# Patient Record
Sex: Male | Born: 1965 | Race: White | Hispanic: No | Marital: Single | Smoking: Current every day smoker
Health system: Southern US, Community
[De-identification: ages and names within clinical notes are randomized; demographics above are authoritative.]

## PROBLEM LIST (undated history)

## (undated) DIAGNOSIS — J449 Chronic obstructive pulmonary disease, unspecified: Secondary | ICD-10-CM

## (undated) DIAGNOSIS — I82409 Acute embolism and thrombosis of unspecified deep veins of unspecified lower extremity: Secondary | ICD-10-CM

## (undated) DIAGNOSIS — F32A Depression, unspecified: Secondary | ICD-10-CM

## (undated) DIAGNOSIS — I2510 Atherosclerotic heart disease of native coronary artery without angina pectoris: Secondary | ICD-10-CM

## (undated) DIAGNOSIS — I2699 Other pulmonary embolism without acute cor pulmonale: Secondary | ICD-10-CM

## (undated) DIAGNOSIS — F329 Major depressive disorder, single episode, unspecified: Secondary | ICD-10-CM

## (undated) DIAGNOSIS — M359 Systemic involvement of connective tissue, unspecified: Secondary | ICD-10-CM

## (undated) DIAGNOSIS — M1990 Unspecified osteoarthritis, unspecified site: Secondary | ICD-10-CM

## (undated) DIAGNOSIS — D6862 Lupus anticoagulant syndrome: Secondary | ICD-10-CM

## (undated) DIAGNOSIS — M479 Spondylosis, unspecified: Secondary | ICD-10-CM

## (undated) DIAGNOSIS — B1910 Unspecified viral hepatitis B without hepatic coma: Secondary | ICD-10-CM

## (undated) DIAGNOSIS — F1010 Alcohol abuse, uncomplicated: Secondary | ICD-10-CM

## (undated) HISTORY — PX: OTHER SURGICAL HISTORY: SHX169

## (undated) HISTORY — PX: TONSILLECTOMY: SUR1361

---

## 2010-06-06 ENCOUNTER — Emergency Department (HOSPITAL_COMMUNITY): Admission: EM | Admit: 2010-06-06 | Discharge: 2010-06-06 | Payer: Self-pay | Admitting: Emergency Medicine

## 2010-06-07 DIAGNOSIS — I2699 Other pulmonary embolism without acute cor pulmonale: Secondary | ICD-10-CM

## 2010-06-07 HISTORY — DX: Other pulmonary embolism without acute cor pulmonale: I26.99

## 2010-06-21 ENCOUNTER — Emergency Department (HOSPITAL_COMMUNITY): Admission: EM | Admit: 2010-06-21 | Discharge: 2010-06-21 | Payer: Self-pay | Admitting: Emergency Medicine

## 2010-06-26 ENCOUNTER — Inpatient Hospital Stay (HOSPITAL_COMMUNITY)
Admission: EM | Admit: 2010-06-26 | Discharge: 2010-07-02 | Payer: Self-pay | Source: Home / Self Care | Admitting: Emergency Medicine

## 2010-06-26 ENCOUNTER — Ambulatory Visit: Payer: Self-pay | Admitting: Pulmonary Disease

## 2010-06-30 ENCOUNTER — Encounter (INDEPENDENT_AMBULATORY_CARE_PROVIDER_SITE_OTHER): Payer: Self-pay | Admitting: Internal Medicine

## 2010-06-30 ENCOUNTER — Ambulatory Visit: Payer: Self-pay | Admitting: Surgery

## 2010-11-19 LAB — HEPATIC FUNCTION PANEL
ALT: 59 U/L — ABNORMAL HIGH (ref 0–53)
AST: 85 U/L — ABNORMAL HIGH (ref 0–37)
Albumin: 2.7 g/dL — ABNORMAL LOW (ref 3.5–5.2)
Alkaline Phosphatase: 86 U/L (ref 39–117)
Total Bilirubin: 0.2 mg/dL — ABNORMAL LOW (ref 0.3–1.2)

## 2010-11-19 LAB — AFB CULTURE WITH SMEAR (NOT AT ARMC)
Acid Fast Smear: NONE SEEN
Acid Fast Smear: NONE SEEN
Acid Fast Smear: NONE SEEN

## 2010-11-19 LAB — CBC
HCT: 38.1 % — ABNORMAL LOW (ref 39.0–52.0)
HCT: 40.3 % (ref 39.0–52.0)
HCT: 44.1 % (ref 39.0–52.0)
HCT: 45.8 % (ref 39.0–52.0)
Hemoglobin: 12.8 g/dL — ABNORMAL LOW (ref 13.0–17.0)
Hemoglobin: 12.9 g/dL — ABNORMAL LOW (ref 13.0–17.0)
Hemoglobin: 13.9 g/dL (ref 13.0–17.0)
Hemoglobin: 14.1 g/dL (ref 13.0–17.0)
Hemoglobin: 15 g/dL (ref 13.0–17.0)
MCH: 32.6 pg (ref 26.0–34.0)
MCH: 32.6 pg (ref 26.0–34.0)
MCH: 32.9 pg (ref 26.0–34.0)
MCH: 33 pg (ref 26.0–34.0)
MCH: 33 pg (ref 26.0–34.0)
MCH: 33.2 pg (ref 26.0–34.0)
MCHC: 33.8 g/dL (ref 30.0–36.0)
MCHC: 33.9 g/dL (ref 30.0–36.0)
MCHC: 34.2 g/dL (ref 30.0–36.0)
MCHC: 34.3 g/dL (ref 30.0–36.0)
MCV: 96.1 fL (ref 78.0–100.0)
MCV: 97 fL (ref 78.0–100.0)
MCV: 97.1 fL (ref 78.0–100.0)
MCV: 97.8 fL (ref 78.0–100.0)
Platelets: 207 10*3/uL (ref 150–400)
Platelets: 211 10*3/uL (ref 150–400)
Platelets: 225 10*3/uL (ref 150–400)
Platelets: 234 10*3/uL (ref 150–400)
Platelets: 260 10*3/uL (ref 150–400)
RBC: 3.93 MIL/uL — ABNORMAL LOW (ref 4.22–5.81)
RBC: 3.96 MIL/uL — ABNORMAL LOW (ref 4.22–5.81)
RBC: 4.19 MIL/uL — ABNORMAL LOW (ref 4.22–5.81)
RBC: 4.21 MIL/uL — ABNORMAL LOW (ref 4.22–5.81)
RBC: 4.3 MIL/uL (ref 4.22–5.81)
RBC: 4.39 MIL/uL (ref 4.22–5.81)
RDW: 16.2 % — ABNORMAL HIGH (ref 11.5–15.5)
RDW: 16.5 % — ABNORMAL HIGH (ref 11.5–15.5)
RDW: 17.1 % — ABNORMAL HIGH (ref 11.5–15.5)
WBC: 5 10*3/uL (ref 4.0–10.5)
WBC: 5.6 10*3/uL (ref 4.0–10.5)
WBC: 6.3 10*3/uL (ref 4.0–10.5)
WBC: 7.3 10*3/uL (ref 4.0–10.5)

## 2010-11-19 LAB — COMPREHENSIVE METABOLIC PANEL
ALT: 42 U/L (ref 0–53)
ALT: 61 U/L — ABNORMAL HIGH (ref 0–53)
AST: 80 U/L — ABNORMAL HIGH (ref 0–37)
Albumin: 2.4 g/dL — ABNORMAL LOW (ref 3.5–5.2)
Alkaline Phosphatase: 102 U/L (ref 39–117)
Alkaline Phosphatase: 80 U/L (ref 39–117)
Alkaline Phosphatase: 86 U/L (ref 39–117)
BUN: 4 mg/dL — ABNORMAL LOW (ref 6–23)
BUN: 5 mg/dL — ABNORMAL LOW (ref 6–23)
BUN: 6 mg/dL (ref 6–23)
CO2: 26 mEq/L (ref 19–32)
CO2: 27 mEq/L (ref 19–32)
CO2: 28 mEq/L (ref 19–32)
Calcium: 8.1 mg/dL — ABNORMAL LOW (ref 8.4–10.5)
Calcium: 8.3 mg/dL — ABNORMAL LOW (ref 8.4–10.5)
Chloride: 105 mEq/L (ref 96–112)
Creatinine, Ser: 0.57 mg/dL (ref 0.4–1.5)
Creatinine, Ser: 0.61 mg/dL (ref 0.4–1.5)
GFR calc non Af Amer: >60 mL/min (ref >60)
GFR calc non Af Amer: >60 mL/min (ref >60)
Glucose, Bld: 81 mg/dL (ref 70–99)
Glucose, Bld: 82 mg/dL (ref 70–99)
Glucose, Bld: 87 mg/dL (ref 70–99)
Glucose, Bld: 99 mg/dL (ref 70–99)
Potassium: 3.8 mEq/L (ref 3.5–5.1)
Potassium: 4 mEq/L (ref 3.5–5.1)
Potassium: 4.3 mEq/L (ref 3.5–5.1)
Sodium: 135 mEq/L (ref 135–145)
Sodium: 141 mEq/L (ref 135–145)
Sodium: 145 mEq/L (ref 135–145)
Total Bilirubin: 0.3 mg/dL (ref 0.3–1.2)
Total Protein: 6.1 g/dL (ref 6.0–8.3)
Total Protein: 6.6 g/dL (ref 6.0–8.3)
Total Protein: 7.4 g/dL (ref 6.0–8.3)

## 2010-11-19 LAB — BASIC METABOLIC PANEL
BUN: 3 mg/dL — ABNORMAL LOW (ref 6–23)
CO2: 25 mEq/L (ref 19–32)
CO2: 27 mEq/L (ref 19–32)
Calcium: 8.5 mg/dL (ref 8.4–10.5)
Calcium: 8.6 mg/dL (ref 8.4–10.5)
Chloride: 104 mEq/L (ref 96–112)
Chloride: 106 mEq/L (ref 96–112)
Creatinine, Ser: 0.68 mg/dL (ref 0.4–1.5)
GFR calc Af Amer: >60 mL/min (ref >60)
GFR calc Af Amer: >60 mL/min (ref >60)
Glucose, Bld: 99 mg/dL (ref 70–99)
Sodium: 141 mEq/L (ref 135–145)

## 2010-11-19 LAB — PROTIME-INR
INR: 1.3 (ref 0.00–1.49)
Prothrombin Time: 11.7 seconds (ref 11.6–15.2)
Prothrombin Time: 13.6 seconds (ref 11.6–15.2)
Prothrombin Time: 15.4 seconds — ABNORMAL HIGH (ref 11.6–15.2)

## 2010-11-19 LAB — DIFFERENTIAL
Basophils Relative: 0 % (ref 0–1)
Eosinophils Absolute: 0 10*3/uL (ref 0.0–0.7)
Eosinophils Absolute: 0 10*3/uL (ref 0.0–0.7)
Eosinophils Relative: 1 % (ref 0–5)
Lymphs Abs: 1.6 10*3/uL (ref 0.7–4.0)
Monocytes Relative: 11 % (ref 3–12)
Monocytes Relative: 9 % (ref 3–12)
Neutrophils Relative %: 50 % (ref 43–77)
Neutrophils Relative %: 58 % (ref 43–77)

## 2010-11-19 LAB — HEPARIN LEVEL (UNFRACTIONATED)
Heparin Unfractionated: 0.33 IU/mL (ref 0.30–0.70)
Heparin Unfractionated: 0.37 IU/mL (ref 0.30–0.70)
Heparin Unfractionated: 0.38 IU/mL (ref 0.30–0.70)

## 2010-11-19 LAB — LIPASE, BLOOD: Lipase: 63 U/L — ABNORMAL HIGH (ref 11–59)

## 2010-11-19 LAB — BLOOD GAS, ARTERIAL
Acid-Base Excess: 1 mmol/L (ref 0.0–2.0)
Bicarbonate: 25.7 mEq/L — ABNORMAL HIGH (ref 20.0–24.0)
FIO2: 0.28 %
O2 Saturation: 94.4 %
TCO2: 22.2 mmol/L (ref 0–100)
pCO2 arterial: 43.2 mmHg (ref 35.0–45.0)
pH, Arterial: 7.392 (ref 7.350–7.450)

## 2010-11-19 LAB — AFP TUMOR MARKER: AFP-Tumor Marker: 2.2 ng/mL (ref 0.0–8.0)

## 2010-11-19 LAB — MAGNESIUM: Magnesium: 1.8 mg/dL (ref 1.5–2.5)

## 2010-11-19 LAB — ETHANOL: Alcohol, Ethyl (B): 320 mg/dL — ABNORMAL HIGH (ref 0–10)

## 2010-11-19 LAB — MRSA PCR SCREENING: MRSA by PCR: NEGATIVE

## 2010-11-21 ENCOUNTER — Emergency Department (HOSPITAL_COMMUNITY)
Admission: EM | Admit: 2010-11-21 | Discharge: 2010-11-21 | Discharge status: Against Medical Advice | Payer: Self-pay | Attending: Emergency Medicine | Admitting: Emergency Medicine

## 2010-11-21 ENCOUNTER — Emergency Department (HOSPITAL_COMMUNITY): Payer: Self-pay

## 2010-11-21 DIAGNOSIS — R072 Precordial pain: Secondary | ICD-10-CM | POA: Insufficient documentation

## 2010-11-21 DIAGNOSIS — R0989 Other specified symptoms and signs involving the circulatory and respiratory systems: Secondary | ICD-10-CM | POA: Insufficient documentation

## 2010-11-21 DIAGNOSIS — Z5900 Homelessness unspecified: Secondary | ICD-10-CM | POA: Insufficient documentation

## 2010-11-21 DIAGNOSIS — F411 Generalized anxiety disorder: Secondary | ICD-10-CM | POA: Insufficient documentation

## 2010-11-21 DIAGNOSIS — Z79899 Other long term (current) drug therapy: Secondary | ICD-10-CM | POA: Insufficient documentation

## 2010-11-21 DIAGNOSIS — R0609 Other forms of dyspnea: Secondary | ICD-10-CM | POA: Insufficient documentation

## 2010-11-21 DIAGNOSIS — M1990 Unspecified osteoarthritis, unspecified site: Secondary | ICD-10-CM | POA: Insufficient documentation

## 2010-11-21 DIAGNOSIS — F17200 Nicotine dependence, unspecified, uncomplicated: Secondary | ICD-10-CM | POA: Insufficient documentation

## 2010-11-21 DIAGNOSIS — R0602 Shortness of breath: Secondary | ICD-10-CM | POA: Insufficient documentation

## 2010-11-21 DIAGNOSIS — R61 Generalized hyperhidrosis: Secondary | ICD-10-CM | POA: Insufficient documentation

## 2010-11-21 LAB — COMPREHENSIVE METABOLIC PANEL
Alkaline Phosphatase: 61 U/L (ref 39–117)
BUN: 4 mg/dL — ABNORMAL LOW (ref 6–23)
Calcium: 8.3 mg/dL — ABNORMAL LOW (ref 8.4–10.5)
Creatinine, Ser: 0.69 mg/dL (ref 0.4–1.5)
Glucose, Bld: 80 mg/dL (ref 70–99)
Total Protein: 6.6 g/dL (ref 6.0–8.3)

## 2010-11-21 LAB — CBC
HCT: 39.8 % (ref 39.0–52.0)
MCHC: 33.7 g/dL (ref 30.0–36.0)
MCV: 97.5 fL (ref 78.0–100.0)
RDW: 14.8 % (ref 11.5–15.5)

## 2010-11-21 LAB — POCT CARDIAC MARKERS

## 2010-11-21 LAB — PROTIME-INR
INR: 0.88 (ref 0.00–1.49)
Prothrombin Time: 12.1 seconds (ref 11.6–15.2)

## 2010-11-21 MED ORDER — IOHEXOL 300 MG/ML  SOLN
100.0000 mL | Freq: Once | INTRAMUSCULAR | Status: AC | PRN
  Administered 2010-11-21: 100 mL via INTRAVENOUS

## 2011-03-01 ENCOUNTER — Emergency Department (HOSPITAL_COMMUNITY): Payer: Self-pay

## 2011-03-01 ENCOUNTER — Encounter (HOSPITAL_COMMUNITY): Payer: Self-pay | Admitting: Radiology

## 2011-03-01 ENCOUNTER — Encounter: Payer: Self-pay | Admitting: Internal Medicine

## 2011-03-01 ENCOUNTER — Inpatient Hospital Stay (HOSPITAL_COMMUNITY)
Admission: EM | Admit: 2011-03-01 | Discharge: 2011-03-04 | DRG: 392 | Disposition: A | Discharge status: Home / Self Care | Payer: Self-pay | Attending: Internal Medicine | Admitting: Internal Medicine

## 2011-03-01 DIAGNOSIS — Z86718 Personal history of other venous thrombosis and embolism: Secondary | ICD-10-CM

## 2011-03-01 DIAGNOSIS — K2920 Alcoholic gastritis without bleeding: Principal | ICD-10-CM | POA: Diagnosis present

## 2011-03-01 DIAGNOSIS — F1010 Alcohol abuse, uncomplicated: Secondary | ICD-10-CM | POA: Diagnosis present

## 2011-03-01 DIAGNOSIS — M25559 Pain in unspecified hip: Secondary | ICD-10-CM | POA: Diagnosis present

## 2011-03-01 DIAGNOSIS — M1990 Unspecified osteoarthritis, unspecified site: Secondary | ICD-10-CM | POA: Diagnosis present

## 2011-03-01 DIAGNOSIS — G8929 Other chronic pain: Secondary | ICD-10-CM | POA: Diagnosis present

## 2011-03-01 DIAGNOSIS — Z7982 Long term (current) use of aspirin: Secondary | ICD-10-CM

## 2011-03-01 DIAGNOSIS — M549 Dorsalgia, unspecified: Secondary | ICD-10-CM | POA: Diagnosis present

## 2011-03-01 DIAGNOSIS — R079 Chest pain, unspecified: Secondary | ICD-10-CM

## 2011-03-01 DIAGNOSIS — Z720 Tobacco use: Secondary | ICD-10-CM | POA: Diagnosis present

## 2011-03-01 DIAGNOSIS — F17200 Nicotine dependence, unspecified, uncomplicated: Secondary | ICD-10-CM | POA: Diagnosis present

## 2011-03-01 LAB — CBC
HCT: 38.6 % — ABNORMAL LOW (ref 39.0–52.0)
RDW: 14.4 % (ref 11.5–15.5)
WBC: 9.7 10*3/uL (ref 4.0–10.5)

## 2011-03-01 LAB — DIFFERENTIAL
Basophils Absolute: 0 10*3/uL (ref 0.0–0.1)
Eosinophils Relative: 1 % (ref 0–5)
Lymphocytes Relative: 10 % — ABNORMAL LOW (ref 12–46)
Neutro Abs: 7.8 10*3/uL — ABNORMAL HIGH (ref 1.7–7.7)
Neutrophils Relative %: 81 % — ABNORMAL HIGH (ref 43–77)

## 2011-03-01 LAB — CARDIAC PANEL(CRET KIN+CKTOT+MB+TROPI)
Relative Index: 2.2 (ref 0.0–2.5)
Relative Index: 2.4 (ref 0.0–2.5)
Total CK: 130 U/L (ref 7–232)
Troponin I: <0.3 ng/mL (ref <0.30)
Troponin I: <0.3 ng/mL (ref <0.30)

## 2011-03-01 LAB — CK TOTAL AND CKMB (NOT AT ARMC)
CK, MB: 3.5 ng/mL (ref 0.3–4.0)
CK, MB: 3.3 ng/mL (ref 0.3–4.0)
Relative Index: 2.3 (ref 0.0–2.5)

## 2011-03-01 LAB — URINALYSIS, ROUTINE W REFLEX MICROSCOPIC
Bilirubin Urine: NEGATIVE
Glucose, UA: NEGATIVE mg/dL
Hgb urine dipstick: NEGATIVE
Specific Gravity, Urine: 1.02 (ref 1.005–1.030)
pH: 5.5 (ref 5.0–8.0)

## 2011-03-01 LAB — RAPID URINE DRUG SCREEN, HOSP PERFORMED
Cocaine: NOT DETECTED
Opiates: POSITIVE — AB

## 2011-03-01 LAB — BASIC METABOLIC PANEL
BUN: 7 mg/dL (ref 6–23)
Chloride: 105 mEq/L (ref 96–112)
GFR calc non Af Amer: >60 mL/min (ref >60)
Glucose, Bld: 101 mg/dL — ABNORMAL HIGH (ref 70–99)
Potassium: 3.9 mEq/L (ref 3.5–5.1)

## 2011-03-01 LAB — TROPONIN I: Troponin I: <0.3 ng/mL (ref <0.30)

## 2011-03-01 LAB — LIPASE, BLOOD: Lipase: 54 U/L (ref 11–59)

## 2011-03-01 LAB — PROTIME-INR
INR: 0.89 (ref 0.00–1.49)
Prothrombin Time: 12.2 seconds (ref 11.6–15.2)

## 2011-03-01 LAB — MRSA PCR SCREENING: MRSA by PCR: NEGATIVE

## 2011-03-01 MED ORDER — IOHEXOL 300 MG/ML  SOLN: 100.0000 mL | Freq: Once | INTRAMUSCULAR | Status: AC | PRN
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
## 2011-03-02 DIAGNOSIS — R079 Chest pain, unspecified: Secondary | ICD-10-CM

## 2011-03-02 LAB — COMPREHENSIVE METABOLIC PANEL
ALT: 33 U/L (ref 0–53)
Alkaline Phosphatase: 63 U/L (ref 39–117)
CO2: 26 mEq/L (ref 19–32)
Glucose, Bld: 79 mg/dL (ref 70–99)
Potassium: 3.6 mEq/L (ref 3.5–5.1)
Sodium: 136 mEq/L (ref 135–145)
Total Bilirubin: 0.6 mg/dL (ref 0.3–1.2)

## 2011-03-02 LAB — HEMOGLOBIN A1C: Hgb A1c MFr Bld: 5.3 % (ref <5.7)

## 2011-03-02 LAB — LIPID PANEL
HDL: 96 mg/dL (ref >39)
LDL Cholesterol: 70 mg/dL (ref 0–99)
Total CHOL/HDL Ratio: 2 RATIO

## 2011-03-02 LAB — CBC
Hemoglobin: 13.5 g/dL (ref 13.0–17.0)
RBC: 3.86 MIL/uL — ABNORMAL LOW (ref 4.22–5.81)

## 2011-03-03 ENCOUNTER — Inpatient Hospital Stay (HOSPITAL_COMMUNITY): Payer: Self-pay

## 2011-03-03 LAB — BASIC METABOLIC PANEL
CO2: 28 mEq/L (ref 19–32)
Calcium: 8.7 mg/dL (ref 8.4–10.5)
Creatinine, Ser: 0.51 mg/dL (ref 0.50–1.35)

## 2011-03-03 LAB — CBC
MCH: 34.8 pg — ABNORMAL HIGH (ref 26.0–34.0)
MCV: 102.2 fL — ABNORMAL HIGH (ref 78.0–100.0)
Platelets: 153 10*3/uL (ref 150–400)
RBC: 4.14 MIL/uL — ABNORMAL LOW (ref 4.22–5.81)
RDW: 13.8 % (ref 11.5–15.5)

## 2011-03-03 MED ORDER — TECHNETIUM TC 99M TETROFOSMIN IV KIT
30.0000 | PACK | Freq: Once | INTRAVENOUS | Status: AC | PRN
  Administered 2011-03-03: 30 via INTRAVENOUS

## 2011-03-03 MED ORDER — TECHNETIUM TC 99M TETROFOSMIN IV KIT
10.0000 | PACK | Freq: Once | INTRAVENOUS | Status: AC | PRN
  Administered 2011-03-03: 10 via INTRAVENOUS
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
## 2011-03-04 ENCOUNTER — Other Ambulatory Visit (HOSPITAL_COMMUNITY): Payer: Self-pay

## 2011-03-04 DIAGNOSIS — R079 Chest pain, unspecified: Secondary | ICD-10-CM

## 2011-03-04 LAB — BASIC METABOLIC PANEL
BUN: 8 mg/dL (ref 6–23)
Calcium: 9.2 mg/dL (ref 8.4–10.5)
Creatinine, Ser: 0.57 mg/dL (ref 0.50–1.35)
GFR calc non Af Amer: >60 mL/min (ref >60)
Glucose, Bld: 101 mg/dL — ABNORMAL HIGH (ref 70–99)
Sodium: 136 mEq/L (ref 135–145)

## 2011-04-21 NOTE — Discharge Summary (Signed)
NAMELUI, BELLIS NO.:  [id number]  MEDICAL RECORD NO.:  [id number]  LOCATION:  3709                         FACILITY:  [facility]  PHYSICIAN:  Ileana Roup, M.D.  DATE OF BIRTH:  1966/01/16  DATE OF ADMISSION:  03/01/2011 DATE OF DISCHARGE:  03/04/2011                              DISCHARGE SUMMARY   ATTENDING PHYSICIAN:  Ileana Roup, MD  DISCHARGE DIAGNOSES: 1. Chest pain, likely alcohol-induced gastritis.     a.     Cardiac etiology was ruled out with negative Myoview. 2. History of pulmonary embolism in October 2011, status post IVC filter     placement secondary to followup issues. 3. History of latent tuberculosis, treated with INH. 4. Degenerative joint disease. 5. Alcohol abuse. 6. Tobacco abuse. 7. Chronic back pain. 8. Right hip pain with negative x-rays of the hip during    this admission. 9. Homeless  DISCHARGE MEDICATIONS WITH ACCURATE DOSES: 1. Aspirin 81 mg, take 1 tablet by mouth daily. 2. Acetaminophen 325 mg, take 650 mg by mouth every 4 hours as needed. 3. Multivitamins, take 1 capsule by mouth daily. 4. Nicotine 21 mg for 24 hour patch, apply one patch to the skin     daily.  DISPOSITION AND FOLLOWUP:  The patient was discharged in stable and improved condition from [facility] on March 04, 2011.  The patient was offered some shelters by our social worker, but he did not want to go to any shelter at the time of discharge.  The patient has been scheduled for followup appointment with [facility] on March 25, 2011, at 2:30 p.m.  The patient then has a followup appointment with Dr. Drue Second at [facility] on April 01, 2011, at 11:00 a.m.  At that time, the patient's right hip pain needs to be reassessed.  If the pain persists and there is a high suspicion for radiculopathy, then MRI can be considered on an outpatient basis.  PROCEDURES PERFORMED: 1. Chest x-ray on March 01, 2011, which showed no  acute active     cardiopulmonary disease. 2. CT angio chest on March 01, 2011, which was negative for PE.  Showed     earlier changes of centrilobular paracentral emphysema. 3. X-rays hip right, which showed no right hip lesion. 4. Myocardial perfusion scan on March 03, 2011, which was normal exam     without evidence of pharmacologically-induced myocardial ischemia.     The calculated EF was 68%. 5. Echo on March 02, 2011, which showed EF of 55%.  No regional wall     motion abnormalities.  CONSULTATIONS:  Cardiology for chest pain.  HISTORY OF PRESENT ILLNESS:  A 45 year old man with past medical history significant for PE, status post IVC filter placed in October 2011, alcohol abuse, presented to ER with chief complaint of chest pain.  He reports waking up from sleep at 3:30 a.m. from chest pain on the morning of his admission.  He describes his chest pain as pressure-like as someone was sitting on his chest, substernal in location with radiation to back, rates his pain 10/10, got worse with deep breaths.  His chest pain was associated  nausea, vomiting, diaphoresis and shortness of breath.  The pain lasted for about 2-3 hours and he was completely pain free by the time we examined him.  He drank about one-fifth of vodka on the night prior to his admission.  He has never had a similar type of pain before.  Denies any history of recent travel or prolonged immobilization.  He denies any palpitation, fever, chills, urinary or bladder complaints.  PHYSICAL EXAMINATION:  VITAL SIGNS:  Temperature 98, pulse 92, blood pressure 128/72, respiratory rate 10, O2 sats 99% on room air. GENERAL:  Well developed, well nourished, in no acute distress. HEENT:  Normocephalic, atraumatic.  Pupils equal, round, reactive to light. NECK:  Supple.  No lymphadenopathy.  No JVD. LUNGS:  Diffuse bilateral expiratory wheezes, good air movement bilaterally. HEART:  Regular rate and rhythm, S1 and S2, without  murmurs, rubs, or gallops. ABDOMEN:  Soft, nontender, nondistended, bowel sounds normoactive.  No masses or organomegaly. EXTREMITIES:  No pretibial edema. NEUROLOGIC:  Alert, oriented x3, cranial nerves II-XII intact.  LABS ON ADMISSION:  White cell count 9.7, hemoglobin 13, hematocrit 38.6, platelets 145.  Sodium 139, potassium 3.9, chloride 105, bicarb 25, BUN 7, creatinine 0.5, glucose 101, calcium 8.  CT total 146, troponin less than 0.3.  HOSPITAL COURSE BY PROBLEMS: 1. Chest pain.  Differential for his chest pain included alcohol-     induced gastritis versus acute coronary syndrome versus     pancreatitis versus PE given the history of PE.  The patient was     ruled out for PE with negative CT angio.  He was admitted to rule     out ACS.  Although his EKG findings were concerning for some PR     depression and T-wave inversions, but the patient had negative     enzymes.  The patient also underwent Myoview scan which showed     completely normal exam, and he was ruled out for acute coronary     syndrome.  It was thought that likely cause for his chest pain was     alcohol-induced gastritis.  His chest pain resolved during the hospital stay. 2. Right hip pain.  The patient continued to complain of right hip     pain that has been chronic and ongoing for about a year.  We did x-     rays of his hip and did not find any acute bony abnormalities or     infectious process. 3. Chronic back pain.  The patient continued to complain of chronic     back pain.  If the back pain persists, then he might need MRI to     rule out radiculopathy on an outpatient basis. 4. Alcohol abuse.  The patient was placed on CIWA protocol and was     started on thiamine and folate given his history of alcohol abuse.     Social worker consult was done for alcohol abuse counseling and     cessation. 5. Tobacco abuse.  The patient had a history of tobacco.  Social     worker consult was done for tobacco  cessation and counseling.  DISCHARGE LABS AND VITALS:  His discharge vitals include temperature 98.4, pulse 61, respiratory rate 80, blood pressure 136/85, O2 sats 97% on room air.  Discharge labs include sodium 136, potassium 3.9, chloride 101, bicarb 27, glucose 101, BUN 8, creatinine 0.57, calcium 9.2.  White cell count 8.6, hemoglobin 14.4, hematocrit 42.3, platelets 153.    ______________________________  Elyse Jarvis, MD   ______________________________ Ileana Roup, M.D.    MS/MEDQ  D:  03/04/2011  T:  03/05/2011  Job:  [id number]  cc:   [facility]  Electronically Signed by Elyse Jarvis MD on 03/22/2011 03:56:35 PM Electronically Signed by Margarito Liner M.D. on 04/21/2011 06:42:09 PM

## 2012-01-22 ENCOUNTER — Emergency Department (HOSPITAL_COMMUNITY)
Admission: EM | Admit: 2012-01-22 | Discharge: 2012-01-22 | Disposition: A | Discharge status: Home / Self Care | Payer: Medicaid Other | Attending: Emergency Medicine | Admitting: Emergency Medicine

## 2012-01-22 ENCOUNTER — Encounter (HOSPITAL_COMMUNITY): Payer: Self-pay | Admitting: *Deleted

## 2012-01-22 ENCOUNTER — Emergency Department (HOSPITAL_COMMUNITY): Payer: Medicaid Other

## 2012-01-22 DIAGNOSIS — M79609 Pain in unspecified limb: Secondary | ICD-10-CM | POA: Insufficient documentation

## 2012-01-22 DIAGNOSIS — I8000 Phlebitis and thrombophlebitis of superficial vessels of unspecified lower extremity: Secondary | ICD-10-CM | POA: Insufficient documentation

## 2012-01-22 DIAGNOSIS — S7010XA Contusion of unspecified thigh, initial encounter: Secondary | ICD-10-CM | POA: Insufficient documentation

## 2012-01-22 DIAGNOSIS — Z86711 Personal history of pulmonary embolism: Secondary | ICD-10-CM | POA: Insufficient documentation

## 2012-01-22 DIAGNOSIS — I2510 Atherosclerotic heart disease of native coronary artery without angina pectoris: Secondary | ICD-10-CM | POA: Insufficient documentation

## 2012-01-22 DIAGNOSIS — F17200 Nicotine dependence, unspecified, uncomplicated: Secondary | ICD-10-CM | POA: Insufficient documentation

## 2012-01-22 DIAGNOSIS — M25473 Effusion, unspecified ankle: Secondary | ICD-10-CM | POA: Insufficient documentation

## 2012-01-22 DIAGNOSIS — M7989 Other specified soft tissue disorders: Secondary | ICD-10-CM | POA: Insufficient documentation

## 2012-01-22 DIAGNOSIS — X58XXXA Exposure to other specified factors, initial encounter: Secondary | ICD-10-CM | POA: Insufficient documentation

## 2012-01-22 DIAGNOSIS — M25476 Effusion, unspecified foot: Secondary | ICD-10-CM | POA: Insufficient documentation

## 2012-01-22 DIAGNOSIS — R42 Dizziness and giddiness: Secondary | ICD-10-CM | POA: Insufficient documentation

## 2012-01-22 DIAGNOSIS — M25579 Pain in unspecified ankle and joints of unspecified foot: Secondary | ICD-10-CM | POA: Insufficient documentation

## 2012-01-22 DIAGNOSIS — R0602 Shortness of breath: Secondary | ICD-10-CM | POA: Insufficient documentation

## 2012-01-22 DIAGNOSIS — R062 Wheezing: Secondary | ICD-10-CM | POA: Insufficient documentation

## 2012-01-22 DIAGNOSIS — I8002 Phlebitis and thrombophlebitis of superficial vessels of left lower extremity: Secondary | ICD-10-CM

## 2012-01-22 DIAGNOSIS — M479 Spondylosis, unspecified: Secondary | ICD-10-CM | POA: Insufficient documentation

## 2012-01-22 DIAGNOSIS — R079 Chest pain, unspecified: Secondary | ICD-10-CM | POA: Insufficient documentation

## 2012-01-22 HISTORY — DX: Spondylosis, unspecified: M47.9

## 2012-01-22 HISTORY — DX: Other pulmonary embolism without acute cor pulmonale: I26.99

## 2012-01-22 HISTORY — DX: Atherosclerotic heart disease of native coronary artery without angina pectoris: I25.10

## 2012-01-22 LAB — CBC
MCH: 34.4 pg — ABNORMAL HIGH (ref 26.0–34.0)
MCHC: 34.2 g/dL (ref 30.0–36.0)
Platelets: 238 10*3/uL (ref 150–400)
RBC: 3.66 MIL/uL — ABNORMAL LOW (ref 4.22–5.81)
RDW: 15.3 % (ref 11.5–15.5)

## 2012-01-22 LAB — DIFFERENTIAL
Basophils Absolute: 0.1 10*3/uL (ref 0.0–0.1)
Basophils Relative: 1 % (ref 0–1)
Eosinophils Absolute: 0.1 10*3/uL (ref 0.0–0.7)
Neutrophils Relative %: 74 % (ref 43–77)

## 2012-01-22 LAB — BASIC METABOLIC PANEL
Calcium: 8.3 mg/dL — ABNORMAL LOW (ref 8.4–10.5)
GFR calc Af Amer: >90 mL/min (ref >90)
GFR calc non Af Amer: >90 mL/min (ref >90)
Potassium: 3.9 mEq/L (ref 3.5–5.1)
Sodium: 139 mEq/L (ref 135–145)

## 2012-01-22 MED ORDER — SODIUM CHLORIDE 0.9 % IV SOLN
1.0000 g | Freq: Once | INTRAVENOUS | Status: AC
  Administered 2012-01-22: 1 g via INTRAVENOUS
  Filled 2012-01-22 (×2): qty 10

## 2012-01-22 MED ORDER — FENTANYL CITRATE 0.05 MG/ML IJ SOLN
50.0000 ug | Freq: Once | INTRAMUSCULAR | Status: AC
  Administered 2012-01-22: 50 ug via INTRAVENOUS
  Filled 2012-01-22: qty 2

## 2012-01-22 MED ORDER — KETOROLAC TROMETHAMINE 30 MG/ML IJ SOLN
30.0000 mg | Freq: Once | INTRAMUSCULAR | Status: AC
  Administered 2012-01-22: 30 mg via INTRAVENOUS
  Filled 2012-01-22: qty 1

## 2012-01-22 MED ORDER — NAPROXEN 500 MG PO TABS: 500.0000 mg | ORAL_TABLET | Freq: Two times a day (BID) | ORAL | Status: DC

## 2012-01-22 MED ORDER — IOHEXOL 300 MG/ML  SOLN
100.0000 mL | Freq: Once | INTRAMUSCULAR | Status: AC | PRN
  Administered 2012-01-22: 100 mL via INTRAVENOUS
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
## 2012-01-22 NOTE — ED Notes (Signed)
Critical I-STAT EDP KOHUT NOTIFIED.

## 2012-01-22 NOTE — ED Notes (Signed)
Pt reports LLE pain and swelling since yesterday-pt reports being on his feet a lot, denies injury.  Pt also reports lower back pain which is chronic.  Pt's LLE is swollen and red.  Pt was in the waiting area-laying on the floor.  Pt made aware that he cannot lay on the floor in the waiting area.  Pt reports he cannot sit in the chair d/t his back pain.

## 2012-01-22 NOTE — Progress Notes (Signed)
VASCULAR LAB PRELIMINARY  PRELIMINARY  PRELIMINARY  PRELIMINARY  Left lower extremity venous duplex completed.    Preliminary report:  Left:  No evidence of DVT, POSITIVE FOR SUPERFICIAL THROMBOSIS OF A BRANCH OF THE GREATER SAPHENOUS COURSING FROM JUST PROXIMAL TO ANKLE TO MID CALF,  No evidence of Baker's cyst.  Mahika Vanvoorhis D, RVS 01/22/2012, 2:14 PM

## 2012-01-22 NOTE — ED Provider Notes (Signed)
History     CSN: [id number]  Arrival date & time 01/22/12  1138   First MD Initiated Contact with Patient 01/22/12 1232    1:07 PM HPI Reports 1 week of left ankle pain and swelling. Reports Pain was worse with ambulation. States 2 days ago pain and swelling worsened. Has bruising on posterior thigh. Denies injury, trauma, mass or drainage. Reports this morning had sharp left sided chest pain and dizziness. Currently asymptomatic. 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 Reports he had to have a filter placed. States he has not been on coumadin for 6 months. Denies hemoptysis, or tachycardia Patient is a 46 y.o. male presenting with leg pain. The history is provided by the patient.  Leg Pain  The incident occurred more than 1 week ago. There was no injury mechanism. The pain is present in the left leg. The pain is severe. The pain has been constant since onset. Pertinent negatives include no numbness, no inability to bear weight, no loss of motion, no muscle weakness and no loss of sensation. The symptoms are aggravated by activity, bearing weight and palpation. He has tried rest for the symptoms. The treatment provided no relief.    Past Medical History  Diagnosis Date  . Coronary artery disease   . Degenerative joint disease of spine   . Pulmonary embolism     History reviewed. No pertinent past surgical history.  No family history on file.  History  Substance Use Topics  . Smoking status: Current Everyday Smoker -- 3.0 packs/day    Types: Cigarettes  . Smokeless tobacco: Not on file  . Alcohol Use: Yes     heavy drinker daily      Review of Systems  Constitutional: Negative for fever and chills.  Respiratory: Positive for shortness of breath.   Cardiovascular: Positive for chest pain and leg swelling.  Gastrointestinal: Negative for nausea and vomiting.  Musculoskeletal:       Leg pain  Neurological: Negative for numbness.  All other systems reviewed and are  negative.    Allergies  Review of patient's allergies indicates no known allergies.  Home Medications   Current Outpatient Rx  Name Route Sig Dispense Refill  . IBUPROFEN 200 MG PO TABS Oral Take 600 mg by mouth every 8 (eight) hours as needed. For pain.      BP 119/68  Pulse 107  Temp(Src) 98.2 F (36.8 C) (Oral)  Resp 12  SpO2 96%  Physical Exam  Vitals reviewed. Constitutional: He is oriented to person, place, and time. He appears well-developed and well-nourished.  HENT:  Head: Normocephalic and atraumatic.  Eyes: Conjunctivae are normal. Pupils are equal, round, and reactive to light.  Neck: Normal range of motion. Neck supple.  Cardiovascular: Normal rate, regular rhythm and normal heart sounds.  Exam reveals no gallop and no friction rub.   No murmur heard. Pulmonary/Chest: Effort normal. He has wheezes (Left sided). He has no rales. He exhibits no tenderness.  Abdominal: Soft. Bowel sounds are normal. He exhibits no distension and no mass. There is no tenderness. There is no rebound and no guarding.  Musculoskeletal:       Left ankle: He exhibits swelling. He exhibits normal range of motion and normal pulse. Achilles tendon normal.       Left lower leg: He exhibits tenderness and swelling. He exhibits no bony tenderness, no edema, no deformity and no laceration.       Legs:      Significant  swelling and left lower extremity with erythema, Tenderness, and bruising along medial thighs. No masses or cords palpated. Normal distal pulses and sensation. Positive Homans sign  Neurological: He is alert and oriented to person, place, and time.  Skin: Skin is warm and dry. No rash noted. No erythema. No pallor.  Psychiatric: He has a normal mood and affect. His behavior is normal.    ED Course  Procedures   Results for orders placed during the hospital encounter of 01/22/12  CBC      Component Value Range   WBC 8.5  4.0 - 10.5 (K/uL)   RBC 3.66 (*) 4.22 - 5.81 (MIL/uL)     Hemoglobin 12.6 (*) 13.0 - 17.0 (g/dL)   HCT 04.5 (*) 40.9 - 52.0 (%)   MCV 100.5 (*) 78.0 - 100.0 (fL)   MCH 34.4 (*) 26.0 - 34.0 (pg)   MCHC 34.2  30.0 - 36.0 (g/dL)   RDW 81.1  91.4 - 78.2 (%)   Platelets 238  150 - 400 (K/uL)  DIFFERENTIAL      Component Value Range   Neutrophils Relative 74  43 - 77 (%)   Neutro Abs 6.3  1.7 - 7.7 (K/uL)   Lymphocytes Relative 15  12 - 46 (%)   Lymphs Abs 1.3  0.7 - 4.0 (K/uL)   Monocytes Relative 9  3 - 12 (%)   Monocytes Absolute 0.8  0.1 - 1.0 (K/uL)   Eosinophils Relative 1  0 - 5 (%)   Eosinophils Absolute 0.1  0.0 - 0.7 (K/uL)   Basophils Relative 1  0 - 1 (%)   Basophils Absolute 0.1  0.0 - 0.1 (K/uL)  BASIC METABOLIC PANEL      Component Value Range   Sodium 139  135 - 145 (mEq/L)   Potassium 3.9  3.5 - 5.1 (mEq/L)   Chloride 103  96 - 112 (mEq/L)   CO2 22  19 - 32 (mEq/L)   Glucose, Bld 87  70 - 99 (mg/dL)   BUN 5 (*) 6 - 23 (mg/dL)   Creatinine, Ser 9.56  0.50 - 1.35 (mg/dL)   Calcium 8.3 (*) 8.4 - 10.5 (mg/dL)   GFR calc non Af Amer >90  >90 (mL/min)   GFR calc Af Amer >90  >90 (mL/min)   Ct Angio Chest W/cm &/or Wo Cm  01/22/2012  *RADIOLOGY REPORT*  Clinical Data: Shortness of breath, left leg pain, history of deep venous thrombosis, question pulmonary embolism  CT ANGIOGRAPHY CHEST  Technique:  Multidetector CT imaging of the chest using the standard protocol during bolus administration of intravenous contrast. Multiplanar reconstructed images including MIPs were obtained and reviewed to evaluate the vascular anatomy. Due to inadequate opacification of the pulmonary arterial system on initial exam, the patient was reinjected with contrast and the exam repeated.  Contrast: OMNIPAQUE IOHEXOL 300 MG/ML SOLN; 80 ml of Omnipaque 300  Comparison: 03/01/2011  Findings: Aorta normal caliber without aneurysm or dissection. No thoracic adenopathy. Pulmonary arteries suboptimally opacified but grossly patent. No definite evidence of  pulmonary embolism. Visualized portion of upper abdomen normal appearance. Minimal linear scarring versus chronic atelectasis lateral right lower lobe, minimally nodular in appearance but unchanged since 03/01/2011. Large calcified granuloma superior segment left lower lobe. Additional scattered chronic lung changes. No definite pulmonary infiltrate, pleural effusion or pneumothorax. Osseous structures unremarkable.  IMPRESSION: No definite evidence of pulmonary embolism identified on exam limited by suboptimal pulmonary arterial opacification, despite repeating exam. Few scattered chronic lung changes  as above. No definite acute intrathoracic abnormality identified.  Original Report Authenticated By: Lollie Marrow, M.D.         VASCULAR LAB  PRELIMINARY PRELIMINARY PRELIMINARY PRELIMINARY  Left lower extremity venous duplex completed.  Preliminary report: Left: No evidence of DVT, POSITIVE FOR SUPERFICIAL THROMBOSIS OF A BRANCH OF THE GREATER SAPHENOUS COURSING FROM JUST PROXIMAL TO ANKLE TO MID CALF, No evidence of Baker's cyst.  SLAUGHTER, VIRGINIA D, RVS  01/22/2012, 2:14 PM        MDM   Patient has history of PE. Had filter placed and was placed on Coumadin last year for this. Reports he has not taken Coumadin in about 6 months. Shortness of breath today so will evaluate and rule out DVT and PE.  5:05 PM Patient is negative for DVT and PE. Will D/c home with crutches and NSAIDs. Advised return for worsening symptoms. Patient voices understanding and is ready for d/c. Discussed plan with Dr. Juleen China   Dx: superficial thombosis     Thomasene Lot, PA-C 01/22/12 1710

## 2012-01-22 NOTE — ED Notes (Addendum)
PT states his LT ankle started swelling 1 week ago.  Denies any injury.  Was ambulating fine until last night when his LT calf started to swell.  States when he got up to walk he was unable and started getting dizzy and shob.  Denies any travel or surgery.  Denies chest pain. Has hx PE w/[facility]field filter placement 2011.  Also LT thigh has bruise but denies injury.

## 2012-01-22 NOTE — Discharge Instructions (Signed)
Phlebitis  Phlebitis is a redness, tenderness and soreness (inflammation) in a vein. This can occur in your arms, legs, or torso (trunk), as well as deeper inside your body.   CAUSES   Phlebitis can be triggered by multiple factors. These include:   Reduced (restricted) blood flow through your veins. This happens with prolonged bed rest, long distance travel, injury or surgery. Being overweight (obese) and pregnant can also restrict blood flow and lead to phlebitis.   Putting a catheter in the vein (intravenous or IV) and giving certain medications through in the vein (intravenously).   Cancer and cancer treatment.   Use of illegal intravenous drugs.   Inflammatory diseases.   Inherited (genetic) diseases that increase the risk for blood clots.   Hormone therapy (such as birth control pills).  SYMPTOMS    Red, tender, swollen, painful area on your skin.   Usually, the area will be long and narrow.   Low grade fever.   Significant firmness along the center of this area. This can indicate that a blood clot has formed.   Surrounding redness or a high fever, which can indicate an infection (cellulitis).  DIAGNOSIS    The appearance of your condition and your symptoms will cause your caregiver to suspect phlebitis. Usually, this is enough for a diagnosis.   Your caregiver may request blood tests or an ultrasound test of the area to be sure you do not have an infection or a blood clot. Blood tests and discussing your family history may also indicate if you have an underlying genetic disease that causes blood clots.   Occasionally, a piece of tissue is taken from the body (biopsy) if an unusual cause of phlebitis is suspected.  TREATMENT    Raise (elevate) the affected area above the level of the heart.   Apply a warm compress or heating pad for 20 minutes, 3 or 4 times a day. If you use an electric heating pad, follow the directions so you do not burn yourself.   Anti-inflammatory medications are usually  recommended. Follow your caregiver's directions.   Any IV catheter, if present, will be removed by your caregiver.   Your caregiver may prescribe medicines that kill germs (antibiotics) if an infection is present.   Your caregiver may recommend blood thinners if a blood clot is suspected or present.   Support stockings or bandages may be helpful, depending on the cause and location of the phlebitis.   Surgery may be needed to remove very damaged sections of vein, but this is rare.  HOME CARE INSTRUCTIONS    Take medications exactly as prescribed.   Follow up with your caregiver as directed.   Use support stockings or bandages if advised. These will speed healing and prevent recurrence.   If you are on blood thinners:   Do follow-up blood tests exactly as directed.   Check with your caregiver before using any new medications.   Wear a pendant to show that you are on blood thinners.   For phlebitis in the legs:   Avoid prolonged standing or bed rest.   Keep your legs moving. Raise your legs with sitting or lying.   Do not smoke.   Women, particularly those over the age of 35, should consider the risks and benefits of taking the contraceptive pill. This kind of hormone treatment can increase your risk for blood clots.  SEEK MEDICAL CARE IF:    You have unusual bruising or any bleeding problems.   Swelling   or pain in your affected arm or leg is not gradually improving.   You are on anti-inflammatory medication and you develop belly (abdominal) pain.  SEEK IMMEDIATE MEDICAL CARE IF:    An unexplained oral temperature above 100.5 F (38.1 C) develops.   You have sudden onset of chest pain or difficulty breathing.  Document Released: 08/18/2001 Document Revised: 08/13/2011 Document Reviewed: 05/20/2009  [employer] Patient Information 2012 [employer]

## 2012-01-22 NOTE — ED Notes (Signed)
Pt found sleeping w/RA sats dropped to 88%.  Placed on 2L/O2/[place] and sats up to high 90's.

## 2012-01-23 ENCOUNTER — Emergency Department (HOSPITAL_COMMUNITY)
Admission: EM | Admit: 2012-01-23 | Discharge: 2012-01-23 | Disposition: A | Discharge status: Home / Self Care | Payer: Medicaid Other | Attending: Emergency Medicine | Admitting: Emergency Medicine

## 2012-01-23 ENCOUNTER — Encounter (HOSPITAL_COMMUNITY): Payer: Self-pay | Admitting: Emergency Medicine

## 2012-01-23 DIAGNOSIS — M1990 Unspecified osteoarthritis, unspecified site: Secondary | ICD-10-CM | POA: Insufficient documentation

## 2012-01-23 DIAGNOSIS — I82812 Embolism and thrombosis of superficial veins of left lower extremities: Secondary | ICD-10-CM

## 2012-01-23 DIAGNOSIS — M79609 Pain in unspecified limb: Secondary | ICD-10-CM | POA: Insufficient documentation

## 2012-01-23 DIAGNOSIS — I82819 Embolism and thrombosis of superficial veins of unspecified lower extremities: Secondary | ICD-10-CM | POA: Insufficient documentation

## 2012-01-23 HISTORY — DX: Unspecified osteoarthritis, unspecified site: M19.90

## 2012-01-23 MED ORDER — TRAMADOL HCL 50 MG PO TABS
50.0000 mg | ORAL_TABLET | Freq: Once | ORAL | Status: AC
  Administered 2012-01-23: 50 mg via ORAL
  Filled 2012-01-23: qty 1

## 2012-01-23 MED ORDER — TRAMADOL HCL 50 MG PO TABS: 50.0000 mg | ORAL_TABLET | Freq: Once | ORAL | Status: DC

## 2012-01-23 NOTE — ED Provider Notes (Signed)
History    This chart was scribed for Gerhard Munch, MD, MD by Smitty Pluck. The patient was seen in room STRE7 and the patient's care was started at 3:28PM.   CSN: [id number]  Arrival date & time 01/23/12  1519   First MD Initiated Contact with Patient 01/23/12 1525      Chief Complaint  Patient presents with  . Leg Pain    The history is provided by the patient.   [name] is a 46 y.o. male who presents to the Emergency Department complaining of severe left leg pain onset 1 week ago. Pt was at [facility] 1 day ago for same symptoms and was diagnoses with blood clot in lower left leg. He has taken the prescribed over the counter pain Aleve without relief. He reports that he has SOB and dizziness. Pt has a filter in place. Pt reports that he has fallen over due to pain. Pain has been constant without radiation. Pain is aggravated by walking.  He has not filled his prescription, and has not had any substantial relief with anything.  Past Medical History  Diagnosis Date  . Coronary artery disease   . Degenerative joint disease of spine   . Pulmonary embolism   . Degenerative joint disease     History reviewed. No pertinent past surgical history.  History reviewed. No pertinent family history.  History  Substance Use Topics  . Smoking status: Current Everyday Smoker -- 3.0 packs/day    Types: Cigarettes  . Smokeless tobacco: Not on file  . Alcohol Use: Yes     heavy drinker daily      Review of Systems  Constitutional:       Per HPI, otherwise negative  HENT:       Per HPI, otherwise negative  Eyes: Negative.   Respiratory:       Per HPI, otherwise negative  Cardiovascular:       Per HPI, otherwise negative  Gastrointestinal: Negative for vomiting.  Genitourinary: Negative.   Musculoskeletal:       Per HPI, otherwise negative  Skin: Negative.   Neurological: Negative for syncope.    Allergies  Review of patient's allergies indicates no known  allergies.  Home Medications   Current Outpatient Rx  Name Route Sig Dispense Refill  . IBUPROFEN 200 MG PO TABS Oral Take 600 mg by mouth every 8 (eight) hours as needed. For pain.    Marland Kitchen NAPROXEN 500 MG PO TABS Oral Take 1 tablet (500 mg total) by mouth 2 (two) times daily. 30 tablet 0    BP 99/68  Pulse 98  Temp(Src) 98.4 F (36.9 C) (Oral)  Resp 18  SpO2 100%  Physical Exam  Nursing note and vitals reviewed. Constitutional: He is oriented to person, place, and time. He appears well-developed. No distress.  HENT:  Head: Normocephalic and atraumatic.  Eyes: Conjunctivae and EOM are normal.  Cardiovascular: Normal rate and regular rhythm.   Pulmonary/Chest: Effort normal. No stridor. No respiratory distress.  Abdominal: He exhibits no distension.  Musculoskeletal: He exhibits no edema.       No right second digit.   Neurological: He is alert and oriented to person, place, and time.  Skin: Skin is warm and dry.     Psychiatric: He has a normal mood and affect.    ED Course  Procedures (including critical care time) DIAGNOSTIC STUDIES: Oxygen Saturation is 100% on room air, normal by my interpretation.    COORDINATION OF CARE:  Labs Reviewed - No data to display Ct Angio Chest W/cm &/or Wo Cm  01/22/2012  *RADIOLOGY REPORT*  Clinical Data: Shortness of breath, left leg pain, history of deep venous thrombosis, question pulmonary embolism  CT ANGIOGRAPHY CHEST  Technique:  Multidetector CT imaging of the chest using the standard protocol during bolus administration of intravenous contrast. Multiplanar reconstructed images including MIPs were obtained and reviewed to evaluate the vascular anatomy. Due to inadequate opacification of the pulmonary arterial system on initial exam, the patient was reinjected with contrast and the exam repeated.  Contrast: OMNIPAQUE IOHEXOL 300 MG/ML SOLN; 80 ml of Omnipaque 300  Comparison: 03/01/2011  Findings: Aorta normal caliber  without aneurysm or dissection. No thoracic adenopathy. Pulmonary arteries suboptimally opacified but grossly patent. No definite evidence of pulmonary embolism. Visualized portion of upper abdomen normal appearance. Minimal linear scarring versus chronic atelectasis lateral right lower lobe, minimally nodular in appearance but unchanged since 03/01/2011. Large calcified granuloma superior segment left lower lobe. Additional scattered chronic lung changes. No definite pulmonary infiltrate, pleural effusion or pneumothorax. Osseous structures unremarkable.  IMPRESSION: No definite evidence of pulmonary embolism identified on exam limited by suboptimal pulmonary arterial opacification, despite repeating exam. Few scattered chronic lung changes as above. No definite acute intrathoracic abnormality identified.  Original Report Authenticated By: Lollie Marrow, M.D.     No diagnosis found.    MDM  I personally performed the services described in this documentation, which was scribed in my presence. The recorded information has been reviewed and considered.  This 46 yo male presents for second time in the past day with concerns of left lower extremity pain.  Notably, the patient's evaluation has already included CT angiography which was negative for PE as well as venous Dopplers, which demonstrated a left distal lower extremity superficial thrombosis.  The patient is in no distress, has an IVC filter in place.  Given the location of the lesion, there is low concern for likely migration, and as mentioned, the patient has an IVC filter.  Notably, the patient is homeless, and he requested assistance.  I discussed the patient's case with social work.  Our social worker advised providing a copy of our homelessness resources, and this was provided.  Notably, although the patient complains of ongoing pain, he has not filled pain medication prescriptions.  Absent new fevers, chills, confusion, dyspnea, cough, chest  pain, there is low suspicion for acute changes within the past day since the radiographic studies and there is no indication for new studies to be performed.  The patient was also provided information on followup services available to people without significant insurance.   Gerhard Munch, MD 01/23/12 1921

## 2012-01-23 NOTE — ED Provider Notes (Signed)
Medical screening examination/treatment/procedure(s) were performed by non-physician practitioner and as supervising physician I was immediately available for consultation/collaboration.  Raeford Razor, MD 01/23/12 [phone]

## 2012-01-23 NOTE — Discharge Instructions (Signed)
It is very important that you continue to have your condition managed by a primary care physician.  Your evaluation both last night and today is notable for evidence of a blood clot in your left lower leg.  Please make sure to call as soon as possible to arrange appropriate followup care.  You have been provided resources to help find shelter in the community.

## 2012-01-23 NOTE — ED Notes (Signed)
Patient complaining of bilateral leg pain and swelling; was seen at [facility] for this yesterday and was diagnosed with phlebitis and a confirmed blood clot.  Patient states that pain is worse today; patient has prescription for Aleve from [facility].

## 2012-01-25 ENCOUNTER — Encounter (HOSPITAL_COMMUNITY): Payer: Self-pay | Admitting: Emergency Medicine

## 2012-01-25 ENCOUNTER — Inpatient Hospital Stay (HOSPITAL_COMMUNITY)
Admission: EM | Admit: 2012-01-25 | Discharge: 2012-02-05 | DRG: 253 | Payer: Medicaid Other | Attending: Internal Medicine | Admitting: Internal Medicine

## 2012-01-25 ENCOUNTER — Emergency Department (HOSPITAL_COMMUNITY): Payer: Medicaid Other

## 2012-01-25 DIAGNOSIS — E871 Hypo-osmolality and hyponatremia: Secondary | ICD-10-CM

## 2012-01-25 DIAGNOSIS — Z91199 Patient's noncompliance with other medical treatment and regimen due to unspecified reason: Secondary | ICD-10-CM

## 2012-01-25 DIAGNOSIS — I82409 Acute embolism and thrombosis of unspecified deep veins of unspecified lower extremity: Secondary | ICD-10-CM

## 2012-01-25 DIAGNOSIS — Z5900 Homelessness unspecified: Secondary | ICD-10-CM

## 2012-01-25 DIAGNOSIS — M7989 Other specified soft tissue disorders: Secondary | ICD-10-CM

## 2012-01-25 DIAGNOSIS — S68118A Complete traumatic metacarpophalangeal amputation of other finger, initial encounter: Secondary | ICD-10-CM

## 2012-01-25 DIAGNOSIS — D696 Thrombocytopenia, unspecified: Secondary | ICD-10-CM | POA: Diagnosis present

## 2012-01-25 DIAGNOSIS — D649 Anemia, unspecified: Secondary | ICD-10-CM | POA: Diagnosis not present

## 2012-01-25 DIAGNOSIS — I82403 Acute embolism and thrombosis of unspecified deep veins of lower extremity, bilateral: Secondary | ICD-10-CM | POA: Diagnosis present

## 2012-01-25 DIAGNOSIS — I82220 Acute embolism and thrombosis of inferior vena cava: Secondary | ICD-10-CM | POA: Diagnosis present

## 2012-01-25 DIAGNOSIS — F17200 Nicotine dependence, unspecified, uncomplicated: Secondary | ICD-10-CM | POA: Diagnosis present

## 2012-01-25 DIAGNOSIS — Z86711 Personal history of pulmonary embolism: Secondary | ICD-10-CM

## 2012-01-25 DIAGNOSIS — F1010 Alcohol abuse, uncomplicated: Secondary | ICD-10-CM | POA: Diagnosis present

## 2012-01-25 DIAGNOSIS — Z9089 Acquired absence of other organs: Secondary | ICD-10-CM

## 2012-01-25 DIAGNOSIS — Z791 Long term (current) use of non-steroidal anti-inflammatories (NSAID): Secondary | ICD-10-CM

## 2012-01-25 DIAGNOSIS — M79609 Pain in unspecified limb: Secondary | ICD-10-CM

## 2012-01-25 DIAGNOSIS — D7289 Other specified disorders of white blood cells: Secondary | ICD-10-CM

## 2012-01-25 DIAGNOSIS — I2510 Atherosclerotic heart disease of native coronary artery without angina pectoris: Secondary | ICD-10-CM | POA: Diagnosis present

## 2012-01-25 DIAGNOSIS — Z79899 Other long term (current) drug therapy: Secondary | ICD-10-CM

## 2012-01-25 DIAGNOSIS — D6859 Other primary thrombophilia: Secondary | ICD-10-CM | POA: Diagnosis present

## 2012-01-25 DIAGNOSIS — N3000 Acute cystitis without hematuria: Secondary | ICD-10-CM | POA: Diagnosis present

## 2012-01-25 DIAGNOSIS — T85898A Other specified complication of other internal prosthetic devices, implants and grafts, initial encounter: Secondary | ICD-10-CM | POA: Diagnosis present

## 2012-01-25 DIAGNOSIS — E876 Hypokalemia: Secondary | ICD-10-CM | POA: Diagnosis not present

## 2012-01-25 DIAGNOSIS — D6862 Lupus anticoagulant syndrome: Secondary | ICD-10-CM | POA: Diagnosis present

## 2012-01-25 DIAGNOSIS — Z9119 Patient's noncompliance with other medical treatment and regimen: Secondary | ICD-10-CM

## 2012-01-25 DIAGNOSIS — I749 Embolism and thrombosis of unspecified artery: Secondary | ICD-10-CM

## 2012-01-25 DIAGNOSIS — IMO0002 Reserved for concepts with insufficient information to code with codable children: Secondary | ICD-10-CM | POA: Diagnosis present

## 2012-01-25 HISTORY — DX: Lupus anticoagulant syndrome: D68.62

## 2012-01-25 LAB — TROPONIN I: Troponin I: <0.3 ng/mL (ref <0.30)

## 2012-01-25 LAB — POCT I-STAT, CHEM 8
Chloride: 99 mEq/L (ref 96–112)
Creatinine, Ser: 0.9 mg/dL (ref 0.50–1.35)
Hemoglobin: 12.9 g/dL — ABNORMAL LOW (ref 13.0–17.0)
Potassium: 4 mEq/L (ref 3.5–5.1)
Sodium: 131 mEq/L — ABNORMAL LOW (ref 135–145)

## 2012-01-25 LAB — CBC
Platelets: 128 10*3/uL — ABNORMAL LOW (ref 150–400)
RBC: 3.48 MIL/uL — ABNORMAL LOW (ref 4.22–5.81)
WBC: 13.3 10*3/uL — ABNORMAL HIGH (ref 4.0–10.5)

## 2012-01-25 LAB — DIFFERENTIAL
Eosinophils Absolute: 0 10*3/uL (ref 0.0–0.7)
Lymphocytes Relative: 10 % — ABNORMAL LOW (ref 12–46)
Lymphs Abs: 1.3 10*3/uL (ref 0.7–4.0)
Neutro Abs: 11 10*3/uL — ABNORMAL HIGH (ref 1.7–7.7)
Neutrophils Relative %: 82 % — ABNORMAL HIGH (ref 43–77)

## 2012-01-25 MED ORDER — HYDROMORPHONE HCL PF 1 MG/ML IJ SOLN
1.0000 mg | INTRAMUSCULAR | Status: DC | PRN
  Administered 2012-01-25: 1 mg via INTRAVENOUS
  Filled 2012-01-25: qty 1

## 2012-01-25 MED ORDER — MORPHINE SULFATE 2 MG/ML IJ SOLN
1.0000 mg | INTRAMUSCULAR | Status: DC | PRN
  Administered 2012-01-25 – 2012-01-26 (×6): 1 mg via INTRAVENOUS
  Filled 2012-01-25 (×7): qty 1

## 2012-01-25 MED ORDER — ENOXAPARIN SODIUM 80 MG/0.8ML ~~LOC~~ SOLN
80.0000 mg | SUBCUTANEOUS | Status: AC
  Administered 2012-01-25: 80 mg via SUBCUTANEOUS
  Filled 2012-01-25: qty 0.8

## 2012-01-25 MED ORDER — ENOXAPARIN SODIUM 80 MG/0.8ML ~~LOC~~ SOLN
80.0000 mg | Freq: Two times a day (BID) | SUBCUTANEOUS | Status: DC
  Administered 2012-01-26: 80 mg via SUBCUTANEOUS
  Filled 2012-01-25 (×3): qty 0.8

## 2012-01-25 MED ORDER — SODIUM CHLORIDE 0.9 % IV SOLN: INTRAVENOUS | Status: AC

## 2012-01-25 MED ORDER — HYDROCODONE-ACETAMINOPHEN 5-325 MG PO TABS
1.0000 | ORAL_TABLET | ORAL | Status: DC | PRN
  Administered 2012-01-25 – 2012-01-26 (×4): 2 via ORAL
  Administered 2012-01-26: 1 via ORAL
  Administered 2012-01-26 – 2012-01-30 (×17): 2 via ORAL
  Administered 2012-01-30: 1 via ORAL
  Administered 2012-01-31 – 2012-02-02 (×9): 2 via ORAL
  Administered 2012-02-02: 1 via ORAL
  Administered 2012-02-03 – 2012-02-05 (×8): 2 via ORAL
  Filled 2012-01-25 (×42): qty 2

## 2012-01-25 MED ORDER — ONDANSETRON HCL 4 MG/2ML IJ SOLN: 4.0000 mg | Freq: Three times a day (TID) | INTRAMUSCULAR | Status: DC | PRN

## 2012-01-25 MED ORDER — MORPHINE SULFATE 4 MG/ML IJ SOLN
8.0000 mg | Freq: Once | INTRAMUSCULAR | Status: AC
  Administered 2012-01-25: 8 mg via INTRAVENOUS
  Filled 2012-01-25: qty 2

## 2012-01-25 MED ORDER — SODIUM CHLORIDE 0.9 % IV SOLN: INTRAVENOUS | Status: DC

## 2012-01-25 MED ORDER — SODIUM CHLORIDE 0.9 % IV BOLUS (SEPSIS)
1000.0000 mL | Freq: Once | INTRAVENOUS | Status: AC
  Administered 2012-01-25: 1000 mL via INTRAVENOUS

## 2012-01-25 MED ORDER — ONDANSETRON HCL 4 MG PO TABS: 4.0000 mg | ORAL_TABLET | Freq: Four times a day (QID) | ORAL | Status: DC | PRN

## 2012-01-25 MED ORDER — ONDANSETRON HCL 4 MG/2ML IJ SOLN
4.0000 mg | Freq: Four times a day (QID) | INTRAMUSCULAR | Status: DC | PRN
Start: 2012-01-25 — End: 2012-02-05

## 2012-01-25 NOTE — ED Notes (Signed)
Attempted to call report.  Nurse needed some time as he had just received another patient.

## 2012-01-25 NOTE — Progress Notes (Signed)
ED CM reviewed self pay pcps, importance of pcp f/u care & cost of pcp services, [email or web address], DSS, health dept, crisis programs, dental information.  Pt reports increased use of etoh and wanting "detox" Pt provided with written information for resources reviewed Pt voiced understanding and appreciation of services

## 2012-01-25 NOTE — ED Notes (Signed)
Pitting edema present on both feet, left foot noticeably larger than right. Pt states left foot and leg are painful, currently using crutches to ambulate

## 2012-01-25 NOTE — H&P (Signed)
PCP:  No primary provider on file.   DOA:  01/25/2012 11:51 AM  Chief Complaint:  Left lower extremity pain and swelling  HPI: 46 year old male who was seen in the ED last week for left lower leg swelling is now presenting with increase swelling and pain. Patient states for the past week he has had increasing swelling to his left lower extremity with associated pain. Describe pain is sharp and throbbing sensation worse with palpation and with walking. Pain is radiating up his left leg and extending into lower abdomen. Pt denies SOB, fever, productive cough, hemoptysis, or chest pain. Pt has been seen in ED for same 3 days ago. Chest CTA is negative for PE. DVT study of Left lower leg is positive for superficial thrombosis but negative for DVT. Pt was given pain medication and crutches however he is here due to worsening of his symptoms. Currently not on any anticoagulants.   Allergies: No Known Allergies  Prior to Admission medications   Medication Sig Start Date End Date Taking? Authorizing Provider  ibuprofen (ADVIL,MOTRIN) 200 MG tablet Take 600 mg by mouth every 8 (eight) hours as needed. For pain.   Yes Historical Provider, MD  naproxen (NAPROSYN) 500 MG tablet Take 1 tablet (500 mg total) by mouth 2 (two) times daily. 01/22/12 01/21/13 Yes Brigitte Cyndie Chime, PA-C  traMADol (ULTRAM) 50 MG tablet Take 50 mg by mouth every 8 (eight) hours as needed. pain 01/23/12 02/02/12 Yes Gerhard Munch, MD    Past Medical History  Diagnosis Date  . Coronary artery disease   . Degenerative joint disease of spine   . Pulmonary embolism   . Degenerative joint disease     Past Surgical History  Procedure Date  . Left elbow surgery   . Tonsillectomy     Social History:  reports that he has been smoking Cigarettes.  He has been smoking about 3 packs per day. He has never used smokeless tobacco. He reports that he drinks alcohol. He reports that he does not use illicit drugs.  History reviewed. No  pertinent family history.  Review of Systems:  Constitutional: Denies fever, chills, diaphoresis, appetite change and fatigue.  HEENT: Denies photophobia, eye pain, redness, hearing loss, ear pain, congestion, sore throat, rhinorrhea, sneezing, mouth sores, trouble swallowing, neck pain, neck stiffness and tinnitus.   Respiratory: Denies SOB, DOE, cough, chest tightness,  and wheezing.   Cardiovascular: Denies chest pain, palpitations.  Gastrointestinal: Denies nausea, vomiting, abdominal pain, diarrhea, constipation, blood in stool and abdominal distention.  Genitourinary: Denies dysuria, urgency, frequency, hematuria, flank pain and difficulty urinating.  Musculoskeletal: Denies myalgias, back pain, joint swelling, arthralgias and gait problem.  Skin: Denies pallor, rash and wound.  Neurological: Denies dizziness, seizures, syncope, weakness, light-headedness, numbness and headaches.  Hematological: Denies adenopathy. Easy bruising, personal or family bleeding history  Psychiatric/Behavioral: Denies suicidal ideation, mood changes, confusion, nervousness, sleep disturbance and agitation  Physical Exam:  Filed Vitals:   01/25/12 1430 01/25/12 1500 01/25/12 1502 01/25/12 1515  BP: 107/68 106/59 104/66   Pulse: 92 92 88   Temp:   99.5 F (37.5 C)   TempSrc:   Oral   Resp: 18 22 19    Height:    6\' 2"  (1.88 m)  Weight:    79.379 kg (175 lb)  SpO2: 100% 99% 99%     Constitutional: Vital signs reviewed.  Patient is in no acute distress and cooperative with exam. Alert and oriented x3.  Head: Normocephalic and atraumatic Ear: TM normal  bilaterally Mouth: no erythema or exudates, MMM Eyes: PERRL, EOMI, conjunctivae normal, No scleral icterus.  Neck: Supple, Trachea midline normal ROM, No JVD, mass, thyromegaly, or carotid bruit present.  Cardiovascular: RRR, S1 normal, S2 normal, no MRG, pulses symmetric and intact bilaterally Pulmonary/Chest: CTAB, no wheezes, rales, or  rhonchi Abdominal: Soft. Non-tender, non-distended, bowel sounds are normal, no masses, organomegaly, or guarding present.  GU: no CVA tenderness Musculoskeletal: No joint deformities, erythema, or stiffness, ROM full and no nontender Ext: L > R lower extremity edema with mld erythema and tenderness to palpation, no cyanosis, pulses palpable bilaterally (DP and PT) Hematology: no cervical, inginal, or axillary adenopathy.  Neurological: A&O x3, Strenght is normal and symmetric bilaterally, cranial nerve II-XII are grossly intact, no focal motor deficit, sensory intact to light touch bilaterally.  Skin: Warm, dry and intact. No rash, cyanosis, or clubbing.  Psychiatric: Normal mood and affect. speech and behavior is normal. Judgment and thought content normal. Cognition and memory are normal.   Labs on Admission:  Results for orders placed during the hospital encounter of 01/25/12 (from the past 48 hour(s))  CBC     Status: Abnormal   Collection Time   01/25/12  1:10 PM      Component Value Range Comment   WBC 13.3 (*) 4.0 - 10.5 (K/uL)    RBC 3.48 (*) 4.22 - 5.81 (MIL/uL)    Hemoglobin 11.8 (*) 13.0 - 17.0 (g/dL)    HCT 16.1 (*) 09.6 - 52.0 (%)    MCV 100.9 (*) 78.0 - 100.0 (fL)    MCH 33.9  26.0 - 34.0 (pg)    MCHC 33.6  30.0 - 36.0 (g/dL)    RDW 04.5  40.9 - 81.1 (%)    Platelets 128 (*) 150 - 400 (K/uL)   DIFFERENTIAL     Status: Abnormal   Collection Time   01/25/12  1:10 PM      Component Value Range Comment   Neutrophils Relative 82 (*) 43 - 77 (%)    Neutro Abs 11.0 (*) 1.7 - 7.7 (K/uL)    Lymphocytes Relative 10 (*) 12 - 46 (%)    Lymphs Abs 1.3  0.7 - 4.0 (K/uL)    Monocytes Relative 8  3 - 12 (%)    Monocytes Absolute 1.1 (*) 0.1 - 1.0 (K/uL)    Eosinophils Relative 0  0 - 5 (%)    Eosinophils Absolute 0.0  0.0 - 0.7 (K/uL)    Basophils Relative 0  0 - 1 (%)    Basophils Absolute 0.0  0.0 - 0.1 (K/uL)   PRO B NATRIURETIC PEPTIDE     Status: Normal   Collection Time    01/25/12  1:16 PM      Component Value Range Comment   Pro B Natriuretic peptide (BNP) 75.9  0 - 125 (pg/mL)   TROPONIN I     Status: Normal   Collection Time   01/25/12  1:16 PM      Component Value Range Comment   Troponin I <0.30  <0.30 (ng/mL)   POCT I-STAT, CHEM 8     Status: Abnormal   Collection Time   01/25/12  1:18 PM      Component Value Range Comment   Sodium 131 (*) 135 - 145 (mEq/L)    Potassium 4.0  3.5 - 5.1 (mEq/L)    Chloride 99  96 - 112 (mEq/L)    BUN 5 (*) 6 - 23 (mg/dL)    Creatinine,  Ser 0.90  0.50 - 1.35 (mg/dL)    Glucose, Bld 90  70 - 99 (mg/dL)    Calcium, Ion 1.61 (*) 1.12 - 1.32 (mmol/L)    TCO2 22  0 - 100 (mmol/L)    Hemoglobin 12.9 (*) 13.0 - 17.0 (g/dL)    HCT 09.6 (*) 04.5 - 52.0 (%)     Radiological Exams on Admission:  CT ANGIO CHEST: 01/22/2012 IMPRESSION:   No definite evidence of pulmonary embolism identified on exam limited by suboptimal pulmonary arterial opacification, despite repeating exam.   Few scattered chronic lung changes as above.   No definite acute intrathoracic abnormality identified.  Assessment/Plan  Bilateral DVT in lower extremities - will admit to regular floor for further evaluation and management - will order Lovenox per pharmacy to help with dosing - will continue to provide supportive care with analgesia as needed for adequate pain control - encourage ambulation when possible medically  Leukocytosis - likely secondary to an acute event - pt is afebrile - will obtain CBC in AM  Hyponatremia - likely pre renal etiology - will provide gentle hydration - BMP in AM  Thrombocytopenia - likely reactive - will obtain CBC in AM  DVT Prophylaxis - on therapeutic anticoagulation for DVT  Code Status - Full  Education  - test results and diagnostic studies were discussed with patient  - patient verbalized the understanding - questions were answered at the bedside and contact information was provided for  additional questions or concerns  Time Spent on Admission: Over 30 minutes  MAGICK-Imanii Gosdin 01/25/2012, 4:36 PM  [facility] Pager # [phone] Main Office # [phone]

## 2012-01-25 NOTE — ED Provider Notes (Signed)
Medical screening examination/treatment/procedure(s) were conducted as a shared visit with non-physician practitioner(s) and myself.  I personally evaluated the patient during the encounter Pt with swelling to leg.  Dx dvt  Benny Lennert, MD 01/25/12 [phone]

## 2012-01-25 NOTE — Progress Notes (Signed)
Noted pt without pcp Noted in 2012 CM documentation pt had an appt at [facility] July 18 & April 01, 2011. CM spoke with Kyla at [facility] Pt was a no show for both appointments and there are no indications pt has been to [facility] at any other time.

## 2012-01-25 NOTE — Progress Notes (Signed)
VASCULAR LAB PRELIMINARY  PRELIMINARY  PRELIMINARY  PRELIMINARY  Bilateral lower extremity venous duplex completed.    Preliminary report:  Left: DVT noted coursing from the distal popliteal through the common femoral veins.  Evidence of superficial thrombosis noted in the small branch of the distal greater saphenous and proximal greater saphenous at the confluence with the common femoral .  No Baker's cyst. Right:  DVT noted coursing from the distal femoral / proximal popliteal through the common femoral veins. .  No evidence of superficial thrombosis.  No Baker's cyst.  [name] D, RVS 01/25/2012, 3:02 PM

## 2012-01-25 NOTE — Progress Notes (Signed)
Spoke with pt who confirms no pcp, no show at [facility] in 2012 x 2, homeless and mail going to [facility].

## 2012-01-25 NOTE — ED Provider Notes (Signed)
History     CSN: [id number]  Arrival date & time 01/25/12  1113   First MD Initiated Contact with Patient 01/25/12 1154      Chief Complaint  Patient presents with  . DVT  . Leg Swelling  . Back Pain    (Consider location/radiation/quality/duration/timing/severity/associated sxs/prior treatment) HPI  46 year old male who is seen in the ED last week for left lower leg swelling is presenting with increase swelling and pain. Patient states for the past week he has had increasing swelling to his left lower extremity with associated pain. Describe pain is sharp and throbbing sensation. Pain worsen with palpation and with walking.  Pain is radiating up his leg, to Left leg and now increasing pain to right leg.  Pain also extended to lower abdomen.  Pt also experiences DOE for the past week.  Denies fever, productive cough, hemoptysis, or chest pain.  Pt has been seen in ED for same 3 days ago.  Chest CTA is negative for PE.  DVT study of Left lower leg is positive for superficial thrombosis but negative for DVT.  Pt was given pain medication and crutches however he is here due to worsening of his symptoms.  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  Currently not on any anticoagulants.    Past Medical History  Diagnosis Date  . Coronary artery disease   . Degenerative joint disease of spine   . Pulmonary embolism   . Degenerative joint disease     History reviewed. No pertinent past surgical history.  History reviewed. No pertinent family history.  History  Substance Use Topics  . Smoking status: Current Everyday Smoker -- 3.0 packs/day    Types: Cigarettes  . Smokeless tobacco: Not on file  . Alcohol Use: Yes     heavy drinker daily      Review of Systems  All other systems reviewed and are negative.    Allergies  Review of patient's allergies indicates no known allergies.  Home Medications   Current Outpatient Rx  Name Route Sig Dispense Refill  . IBUPROFEN 200  MG PO TABS Oral Take 600 mg by mouth every 8 (eight) hours as needed. For pain.    Marland Kitchen NAPROXEN 500 MG PO TABS Oral Take 1 tablet (500 mg total) by mouth 2 (two) times daily. 30 tablet 0  . TRAMADOL HCL 50 MG PO TABS Oral Take 50 mg by mouth every 8 (eight) hours as needed. pain      BP 95/50  Pulse 87  Temp(Src) 97.8 F (36.6 C) (Oral)  Resp 16  Ht 6\' 2"  (1.88 m)  Wt 175 lb (79.379 kg)  BMI 22.47 kg/m2  SpO2 100%  Physical Exam  Nursing note and vitals reviewed. Constitutional: He is oriented to person, place, and time. He appears well-developed and well-nourished. No distress.       Awake, alert, nontoxic appearance  HENT:  Head: Atraumatic.  Eyes: Conjunctivae are normal. Right eye exhibits no discharge. Left eye exhibits no discharge.  Neck: Normal range of motion. Neck supple.  Cardiovascular: Normal rate and regular rhythm.   Pulmonary/Chest: Effort normal. No respiratory distress. He exhibits no tenderness.       Scatter rhonchi noted  Abdominal: Soft. There is no tenderness. There is no rebound.    Musculoskeletal: He exhibits edema. He exhibits no tenderness.       Left lower extremity edema noted with mild erythema throughout. Brisk cap refill. Tenderness noted to left calf.  No significant edema noted to R leg.  ROM appears intact, no obvious focal weakness.  Neurological: He is alert and oriented to person, place, and time.  Skin: Skin is warm and dry. No rash noted.  Psychiatric: He has a normal mood and affect.    ED Course  Procedures (including critical care time)  Labs Reviewed - No data to display No results found.   No diagnosis found.  Results for orders placed during the hospital encounter of 01/25/12  CBC      Component Value Range   WBC 13.3 (*) 4.0 - 10.5 (K/uL)   RBC 3.48 (*) 4.22 - 5.81 (MIL/uL)   Hemoglobin 11.8 (*) 13.0 - 17.0 (g/dL)   HCT 16.1 (*) 09.6 - 52.0 (%)   MCV 100.9 (*) 78.0 - 100.0 (fL)   MCH 33.9  26.0 - 34.0 (pg)   MCHC  33.6  30.0 - 36.0 (g/dL)   RDW 04.5  40.9 - 81.1 (%)   Platelets 128 (*) 150 - 400 (K/uL)  DIFFERENTIAL      Component Value Range   Neutrophils Relative 82 (*) 43 - 77 (%)   Neutro Abs 11.0 (*) 1.7 - 7.7 (K/uL)   Lymphocytes Relative 10 (*) 12 - 46 (%)   Lymphs Abs 1.3  0.7 - 4.0 (K/uL)   Monocytes Relative 8  3 - 12 (%)   Monocytes Absolute 1.1 (*) 0.1 - 1.0 (K/uL)   Eosinophils Relative 0  0 - 5 (%)   Eosinophils Absolute 0.0  0.0 - 0.7 (K/uL)   Basophils Relative 0  0 - 1 (%)   Basophils Absolute 0.0  0.0 - 0.1 (K/uL)  POCT I-STAT, CHEM 8      Component Value Range   Sodium 131 (*) 135 - 145 (mEq/L)   Potassium 4.0  3.5 - 5.1 (mEq/L)   Chloride 99  96 - 112 (mEq/L)   BUN 5 (*) 6 - 23 (mg/dL)   Creatinine, Ser 9.14  0.50 - 1.35 (mg/dL)   Glucose, Bld 90  70 - 99 (mg/dL)   Calcium, Ion 7.82 (*) 1.12 - 1.32 (mmol/L)   TCO2 22  0 - 100 (mmol/L)   Hemoglobin 12.9 (*) 13.0 - 17.0 (g/dL)   HCT 95.6 (*) 21.3 - 52.0 (%)  PRO B NATRIURETIC PEPTIDE      Component Value Range   Pro B Natriuretic peptide (BNP) 75.9  0 - 125 (pg/mL)  TROPONIN I      Component Value Range   Troponin I <0.30  <0.30 (ng/mL)   Dg Chest 2 View  01/25/2012  *RADIOLOGY REPORT*  Clinical Data: Cough, dyspnea  CHEST - 2 VIEW  Comparison: 01/22/2012 CT of the chest  Findings: Cardiomediastinal silhouette is stable.  No acute infiltrate or pleural effusion.  No pulmonary edema.  Calcified granuloma left lower lobe is stable.  IMPRESSION: No active disease.  No significant change.  Original Report Authenticated By: Natasha Mead, M.D.   Ct Angio Chest W/cm &/or Wo Cm  01/22/2012  *RADIOLOGY REPORT*  Clinical Data: Shortness of breath, left leg pain, history of deep venous thrombosis, question pulmonary embolism  CT ANGIOGRAPHY CHEST  Technique:  Multidetector CT imaging of the chest using the standard protocol during bolus administration of intravenous contrast. Multiplanar reconstructed images including MIPs were  obtained and reviewed to evaluate the vascular anatomy. Due to inadequate opacification of the pulmonary arterial system on initial exam, the patient was reinjected with contrast and the exam repeated.  Contrast:  OMNIPAQUE IOHEXOL 300 MG/ML SOLN; 80 ml of Omnipaque 300  Comparison: 03/01/2011  Findings: Aorta normal caliber without aneurysm or dissection. No thoracic adenopathy. Pulmonary arteries suboptimally opacified but grossly patent. No definite evidence of pulmonary embolism. Visualized portion of upper abdomen normal appearance. Minimal linear scarring versus chronic atelectasis lateral right lower lobe, minimally nodular in appearance but unchanged since 03/01/2011. Large calcified granuloma superior segment left lower lobe. Additional scattered chronic lung changes. No definite pulmonary infiltrate, pleural effusion or pneumothorax. Osseous structures unremarkable.  IMPRESSION: No definite evidence of pulmonary embolism identified on exam limited by suboptimal pulmonary arterial opacification, despite repeating exam. Few scattered chronic lung changes as above. No definite acute intrathoracic abnormality identified.  Original Report Authenticated By: Lollie Marrow, M.D.   Patient Information       Patient Name  Sex  DOB  SSN    [name], [name]  Male  1966/08/11  [id number]             Progress Notes signed by Kerrin Champagne at 01/25/12 1509     Author:  Kerrin Champagne  Service:  (none)  Author Type:  Technician   Filed:  01/25/12 1509  Note Time:  01/25/12 1502          VASCULAR LAB  PRELIMINARY PRELIMINARY PRELIMINARY PRELIMINARY  Bilateral lower extremity venous duplex completed.  Preliminary report: Left: DVT noted coursing from the distal popliteal through the common femoral veins. Evidence of superficial thrombosis noted in the small branch of the distal greater saphenous and proximal greater saphenous at the confluence with the common femoral . No Baker's cyst.  Right: DVT noted coursing from the distal femoral / proximal popliteal through the common femoral veins. . No evidence of superficial thrombosis. No Baker's cyst.  SLAUGHTER, [place] D, RVS  01/25/2012, 3:02 PM       MDM  Increased LLE swelling and pain.  Prior Chest CTA and venous doppler study 3 days ago only significant for superficial thrombosis to LLE.  No PE noted.  However, due to increase sxs, will order troponin, cxr, vascular study, and bnp.  Discussed care with my attending.    3:12 PM Repeat venous Doppler study shows significant DVT noted to both lower extremities extending from distal popliteal through common femoral vein bilaterally.  Lovenox ordered.  Will call for admission.   3:34 PM I have consulted with [facility], who agrees to have pt admit to a med surg bed, Team 3, under the care of Dr. Izola Price.  Pt is made aware of plan.      Fayrene Helper, PA-C 01/25/12 1535

## 2012-01-25 NOTE — Progress Notes (Addendum)
ANTICOAGULATION CONSULT NOTE - Initial Consult  Pharmacy Consult for Lovenox   Indication: bilateral DVT  No Known Allergies  Patient Measurements: Height: 6\' 2"  (188 cm) Weight: 175 lb (79.379 kg) IBW/kg (Calculated) : 82.2   Vital Signs: Temp: 99.5 F (37.5 C) (05/20 1502) Temp src: Oral (05/20 1502) BP: 104/66 mmHg (05/20 1502) Pulse Rate: 88  (05/20 1502)  Labs:  Basename 01/25/12 1318 01/25/12 1316 01/25/12 1310  HGB 12.9* -- 11.8*  HCT 38.0* -- 35.1*  PLT -- -- 128*  APTT -- -- --  LABPROT -- -- --  INR -- -- --  HEPARINUNFRC -- -- --  CREATININE 0.90 -- --  CKTOTAL -- -- --  CKMB -- -- --  TROPONINI -- <0.30 --    Estimated Creatinine Clearance: 115.2 ml/min (by C-G formula based on Cr of 0.9).  Medical History: Past Medical History  Diagnosis Date  . Coronary artery disease   . Degenerative joint disease of spine   . Pulmonary embolism   . Degenerative joint disease    Assessment:  46 YO homeless male with h/o PE placed on Coumadin in the past but has not taken it x 6 months, patient has an IVC filter in placed. Patient presented 5/17 with L ankle pain and swelling. LLE venous duplex at the time negative for DVT (+ for superficial thrombosis). Patient returns 5/20 with similar complaints and preliminary BLE venous duplex now with bilateral DVT.  MD ordered to start Lovenox for VTE treatment.   Patient weighs 79.4 kg, Scr 0.9 and CrCl > 100 ml/min  H/H okay with Platlets low at 128K (this is new, plts was 238K on 5/17)  Goal of Therapy:  Anti-Xa level 0.6-1.2 units/ml 4hrs after LMWH dose given Monitor platelets by anticoagulation protocol: Yes   Plan:   Lovenox 80 mg sq q12h  CBC q72 hours while on Lovenox  Pharmacy will f/u with long term anticoagulation plans  Geoffry Paradise Thi 01/25/2012,3:27 PM

## 2012-01-25 NOTE — Progress Notes (Signed)
ED CM sent text to ED SW Re: homelessness

## 2012-01-25 NOTE — ED Notes (Signed)
Here for increased leg swelling from ankle to thigh... Seen here last week for DVT. Uses crutches, but states is unable to walk with them ... homeless

## 2012-01-26 ENCOUNTER — Inpatient Hospital Stay (HOSPITAL_COMMUNITY): Payer: Medicaid Other

## 2012-01-26 DIAGNOSIS — D7289 Other specified disorders of white blood cells: Secondary | ICD-10-CM

## 2012-01-26 DIAGNOSIS — I749 Embolism and thrombosis of unspecified artery: Secondary | ICD-10-CM

## 2012-01-26 DIAGNOSIS — E871 Hypo-osmolality and hyponatremia: Secondary | ICD-10-CM

## 2012-01-26 DIAGNOSIS — M79609 Pain in unspecified limb: Secondary | ICD-10-CM

## 2012-01-26 LAB — CBC
MCH: 33.8 pg (ref 26.0–34.0)
MCHC: 32.8 g/dL (ref 30.0–36.0)
Platelets: 143 10*3/uL — ABNORMAL LOW (ref 150–400)
RBC: 3.2 MIL/uL — ABNORMAL LOW (ref 4.22–5.81)

## 2012-01-26 LAB — URINALYSIS, ROUTINE W REFLEX MICROSCOPIC
Bilirubin Urine: NEGATIVE
Hgb urine dipstick: NEGATIVE
Nitrite: NEGATIVE
Specific Gravity, Urine: 1.013 (ref 1.005–1.030)
pH: 5.5 (ref 5.0–8.0)

## 2012-01-26 LAB — BASIC METABOLIC PANEL
BUN: 11 mg/dL (ref 6–23)
Calcium: 8.7 mg/dL (ref 8.4–10.5)
GFR calc Af Amer: >90 mL/min (ref >90)
GFR calc non Af Amer: >90 mL/min (ref >90)
Glucose, Bld: 94 mg/dL (ref 70–99)
Sodium: 131 mEq/L — ABNORMAL LOW (ref 135–145)

## 2012-01-26 LAB — HEPARIN LEVEL (UNFRACTIONATED): Heparin Unfractionated: 0.33 IU/mL (ref 0.30–0.70)

## 2012-01-26 MED ORDER — DEXTROSE 5 % IV SOLN
1.0000 g | INTRAVENOUS | Status: AC
  Administered 2012-01-26 – 2012-01-29 (×4): 1 g via INTRAVENOUS
  Filled 2012-01-26 (×4): qty 10

## 2012-01-26 MED ORDER — HEPARIN (PORCINE) IN NACL 100-0.45 UNIT/ML-% IJ SOLN
1900.0000 [IU]/h | INTRAMUSCULAR | Status: DC
  Administered 2012-01-26: 1500 [IU]/h via INTRAVENOUS
  Administered 2012-01-27: 1800 [IU]/h via INTRAVENOUS
  Administered 2012-01-27: 1650 [IU]/h via INTRAVENOUS
  Administered 2012-01-27: 1800 [IU]/h via INTRAVENOUS
  Administered 2012-01-28 – 2012-01-29 (×2): 1900 [IU]/h via INTRAVENOUS
  Filled 2012-01-26 (×8): qty 250

## 2012-01-26 MED ORDER — IOHEXOL 350 MG/ML SOLN
100.0000 mL | Freq: Once | INTRAVENOUS | Status: AC | PRN
  Administered 2012-01-26: 100 mL via INTRAVENOUS

## 2012-01-26 MED ORDER — MORPHINE SULFATE 2 MG/ML IJ SOLN
2.0000 mg | INTRAMUSCULAR | Status: DC | PRN
  Administered 2012-01-26 – 2012-01-27 (×5): 2 mg via INTRAVENOUS
  Filled 2012-01-26 (×5): qty 1

## 2012-01-26 MED ORDER — FUROSEMIDE 10 MG/ML IJ SOLN
40.0000 mg | Freq: Once | INTRAMUSCULAR | Status: AC
  Administered 2012-01-26: 40 mg via INTRAVENOUS
  Filled 2012-01-26: qty 4

## 2012-01-26 MED ORDER — MORPHINE SULFATE 4 MG/ML IJ SOLN
4.0000 mg | Freq: Once | INTRAMUSCULAR | Status: AC
  Administered 2012-01-26: 4 mg via INTRAVENOUS
  Filled 2012-01-26: qty 1

## 2012-01-26 MED ORDER — ENOXAPARIN SODIUM 120 MG/0.8ML ~~LOC~~ SOLN
1.5000 mg/kg | SUBCUTANEOUS | Status: DC
  Filled 2012-01-26: qty 0.8

## 2012-01-26 NOTE — Progress Notes (Signed)
Initial visit with pt in response to spiritual care consult.    Pt expressed uncertainty about course of illness.  Pt expressed frustration around limitation of movement d/t leg swelling and pain.  Pt has few resources and expressed feeling alone and without support.   Chaplain provided emotional support and will continue to follow and assess for needs and support.    Belva Crome  MDiv, Chaplain    01/26/12 1800  Clinical Encounter Type  Visited With Patient  Visit Type Initial;Social support;Spiritual support;Psychological support  Referral From Nurse  Recommendations Follow for continued assessment and support  Spiritual Encounters  Spiritual Needs Emotional

## 2012-01-26 NOTE — Progress Notes (Signed)
Clinical Social Work Department BRIEF PSYCHOSOCIAL ASSESSMENT 01/26/2012  Patient:  [name],[name]     Account Number:  [id number]     Admit date:  01/25/2012  Clinical Social Worker:  Doroteo Glassman  Date/Time:  01/26/2012 04:05 PM  Referred by:  Physician  Date Referred:  01/26/2012 Referred for  Homelessness   Other Referral:   May need SA referral   Interview type:  Patient Other interview type:    PSYCHOSOCIAL DATA Living Status:  OTHER Admitted from facility:   Level of care:   Primary support name:  None Primary support relationship to patient:   Degree of support available:   poor--no family    CURRENT CONCERNS Current Concerns  Other - See comment   Other Concerns:   Pt is homeless and has SA issues; he drinks "no less than 2 12-packs per day."    SOCIAL WORK ASSESSMENT / PLAN CSW to assist Pt with SA tx, securing a shelter and other SW needs, as they arise.   Assessment/plan status:  Psychosocial Support/Ongoing Assessment of Needs Other assessment/ plan:   Information/referral to community resources:   CSW to provide Pt with shelter and SA tx information.    PATIENT'S/FAMILY'S RESPONSE TO PLAN OF CARE: Pt thanked CSW for time and assistance.  Providence Crosby, LCSWA Clinical Social Work [phone]

## 2012-01-26 NOTE — Progress Notes (Addendum)
Patient ID: [name]  male  ZOX:[id number]    DOB: December 05, 1965    DOA: 01/25/2012  PCP: No primary provider on file.  Subjective: Patient seen earlier this morning, complaining of pain and swelling in his left lower extremity  Objective: Weight change:   Intake/Output Summary (Last 24 hours) at 01/26/12 1127 Last data filed at 01/26/12 0727  Gross per 24 hour  Intake 1132.11 ml  Output    250 ml  Net 882.11 ml   Blood pressure 105/66, pulse 71, temperature 98.5 F (36.9 C), temperature source Oral, resp. rate 18, height 6\' 2"  (1.88 m), weight 79.379 kg (175 lb), SpO2 99.00%.  Physical Exam: General: Alert and awake, oriented x3, not in any acute distress. HEENT: anicteric sclera, pupils reactive to light and accommodation, EOMI CVS: S1-S2 clear, no murmur rubs or gallops Chest: clear to auscultation bilaterally, no wheezing, rales or rhonchi Abdomen: soft nontender, nondistended, normal bowel sounds, no organomegaly Extremities: no cyanosis, clubbing. Left > right LE edema, worse in left thigh   Lab Results: Basic Metabolic Panel:  Lab 01/26/12 8119 01/25/12 1318 01/22/12 1317  NA 131* 131* --  K 3.7 4.0 --  CL 96 99 --  CO2 23 -- 22  GLUCOSE 94 90 --  BUN 11 5* --  CREATININE 0.70 0.90 --  CALCIUM 8.7 -- 8.3*  MG -- -- --  PHOS -- -- --   CBC:  Lab 01/26/12 0515 01/25/12 1318 01/25/12 1310  WBC 10.7* -- 13.3*  NEUTROABS -- -- 11.0*  HGB 10.8* 12.9* --  HCT 32.9* 38.0* --  MCV 102.8* -- 100.9*  PLT 143* -- 128*   Cardiac Enzymes:  Lab 01/25/12 1316  CKTOTAL --  CKMB --  CKMBINDEX --  TROPONINI <0.30    Studies/Results: Dg Chest 2 View  01/25/2012  *RADIOLOGY REPORT*  Clinical Data: Cough, dyspnea  CHEST - 2 VIEW  Comparison: 01/22/2012 CT of the chest  Findings: Cardiomediastinal silhouette is stable.  No acute infiltrate or pleural effusion.  No pulmonary edema.  Calcified granuloma left lower lobe is stable.  IMPRESSION: No active disease.  No  significant change.  Original Report Authenticated By: Natasha Mead, M.D.   Ct Angio Chest W/cm &/or Wo Cm   IMPRESSION: No definite evidence of pulmonary embolism identified on exam limited by suboptimal pulmonary arterial opacification, despite repeating exam. Few scattered chronic lung changes as above. No definite acute intrathoracic abnormality identified.  Original Report Authenticated By: Lollie Marrow, M.D.   Ct Femur Left Wo Contrast  .  IMPRESSION:  1. Possible arterial occlusion in the left thigh- correlate with distal pulses and consider Doppler assessment or CT angiogram. 2.  Diffuse bladder wall thickening may reflect cystitis.  There is surrounding fluid in the prevesical space.  There is also pelvic mesenteric edema and presacral edema. 3.  Diffuse subcutaneous edema is present in the left thigh, with abnormal edema tracking along the fascial planes, but without drainable abscess observed.  There is also perivascular edema along the deep venous structures and adjacent arterial structures.  Original Report Authenticated By: Dellia Cloud, M.D.    Medications: Scheduled Meds:   . enoxaparin (LOVENOX) injection  80 mg Subcutaneous STAT  . furosemide  40 mg Intravenous Once  .  morphine injection  8 mg Intravenous Once  . sodium chloride  1,000 mL Intravenous Once  . DISCONTD: sodium chloride   Intravenous STAT  . DISCONTD: enoxaparin (LOVENOX) injection  1.5 mg/kg Subcutaneous Q24H  .  DISCONTD: enoxaparin (LOVENOX) injection  80 mg Subcutaneous Q12H   Continuous Infusions:   . sodium chloride 75 mL/hr (01/25/12 1645)     Assessment/Plan: Active Problems:  Bilateral lower extremity DVT with extensive clot, left thigh edema - Ordered stat CT left femur to rule out any hematoma, compartment syndrome given patient did have falls in the recent past and started on anticoagulation. Stat CT left femur did not show any hematoma or compartment syndrome/fasciitis however raised  the question of possible arterial occlusion. - I DC'ed the Lovenox, place him on heparin drip per pharmacy.  - I discussed in detail with vascular surgery, Dr. Myra Gianotti (on-call), who recommended to obtain stat CT angiogram and call him back with the results. - Ordered hypercoagulable panel.   Cystitis on the CT imaging - Obtain UA and urine culture - Placed him on Rocephin  DVT Prophylaxis: On heparin drip, if patient has no acute arterial occlusion, will need to start Coumadin today for extensive bilateral LE DVT  Code Status: Full code  Disposition: Not medically ready   LOS: 1 day   Poppy Mcafee M.D. [facility] 01/26/2012, 11:27 AM Pager: [phone]   Addendum:  CT of the left high showed possible arterial occlusion in the left thigh. Patient did not have the signs for acute ischemia, Doppler was not available. stat CT angiogram was done which per preliminary report by Dr. Ova Freshwater (radiology ) and Dr. Myra Gianotti (vascular surgery) is negative for any acute arterial occlusion.  - IR consult placed for evaluation if he is possible candidate for thrombolysis given the extensive bilateral DVT.  -UA negative for any UTI (CT concerning for cystitis)    Loryn Haacke M.D. [facility] 01/26/2012, 4:05 PM  Pager: [phone]

## 2012-01-26 NOTE — Progress Notes (Signed)
ANTICOAGULATION CONSULT NOTE - Initial Consult  Pharmacy Consult for IV heparin Indication: DVT  No Known Allergies  Patient Measurements: Height: 6\' 2"  (188 cm) Weight: 175 lb (79.379 kg) IBW/kg (Calculated) : 82.2   Vital Signs: Temp: 98.5 F (36.9 C) (05/21 0542) Temp src: Oral (05/21 0542) BP: 105/66 mmHg (05/21 0542) Pulse Rate: 71  (05/21 0542)  Labs:  Basename 01/26/12 0515 01/25/12 1318 01/25/12 1316 01/25/12 1310  HGB 10.8* 12.9* -- --  HCT 32.9* 38.0* -- 35.1*  PLT 143* -- -- 128*  APTT -- -- -- --  LABPROT -- -- -- --  INR -- -- -- --  HEPARINUNFRC -- -- -- --  CREATININE 0.70 0.90 -- --  CKTOTAL -- -- -- --  CKMB -- -- -- --  TROPONINI -- -- <0.30 --    Estimated Creatinine Clearance: 129.6 ml/min (by C-G formula based on Cr of 0.7).   Medical History: Past Medical History  Diagnosis Date  . Coronary artery disease   . Degenerative joint disease of spine   . Pulmonary embolism   . Degenerative joint disease     Medications:  Scheduled:    . cefTRIAXone (ROCEPHIN)  IV  1 g Intravenous Q24H  . enoxaparin (LOVENOX) injection  80 mg Subcutaneous STAT  . furosemide  40 mg Intravenous Once  .  morphine injection  8 mg Intravenous Once  . sodium chloride  1,000 mL Intravenous Once  . DISCONTD: sodium chloride   Intravenous STAT  . DISCONTD: enoxaparin (LOVENOX) injection  1.5 mg/kg Subcutaneous Q24H  . DISCONTD: enoxaparin (LOVENOX) injection  80 mg Subcutaneous Q12H   Infusions:    . sodium chloride 75 mL/hr (01/25/12 1645)    Assessment/Plan: ?arterial occlusion pending CT results so Dr. Isidoro Donning has changed Lovenox to IV heparin. Note that Lovenox was administered at 5am this morning, so 3/4 of the 12 hr dosing interval of Lovenox will need to have lapsed prior to starting IV heparin without a bolus. Start 1500 units/hr IV heparin at 2pm today. Check a 8 hr heparin level. Daily heparin level and CBC  Goal of Therapy:  Heparin level 0.3-0.7  units/ml Monitor platelets by anticoagulation protocol: Yes    Berkley Harvey 01/26/2012,11:37 AM

## 2012-01-27 ENCOUNTER — Inpatient Hospital Stay (HOSPITAL_COMMUNITY): Payer: Medicaid Other

## 2012-01-27 LAB — CBC
HCT: 31.2 % — ABNORMAL LOW (ref 39.0–52.0)
HCT: 30.9 % — ABNORMAL LOW (ref 39.0–52.0)
Hemoglobin: 10.3 g/dL — ABNORMAL LOW (ref 13.0–17.0)
Hemoglobin: 10.3 g/dL — ABNORMAL LOW (ref 13.0–17.0)
MCHC: 33 g/dL (ref 30.0–36.0)
MCHC: 33.3 g/dL (ref 30.0–36.0)
MCV: 101.6 fL — ABNORMAL HIGH (ref 78.0–100.0)
RDW: 14.4 % (ref 11.5–15.5)
RDW: 14.2 % (ref 11.5–15.5)
WBC: 10.7 10*3/uL — ABNORMAL HIGH (ref 4.0–10.5)

## 2012-01-27 LAB — URINE CULTURE

## 2012-01-27 LAB — LUPUS ANTICOAGULANT PANEL
DRVVT: 37.6 secs (ref <45.1)
PTT Lupus Anticoagulant: 47.6 secs — ABNORMAL HIGH (ref 28.0–43.0)
PTTLA 4:1 Mix: 48 secs — ABNORMAL HIGH (ref 28.0–43.0)
PTTLA Confirmation: 16.1 secs — ABNORMAL HIGH (ref <8.0)

## 2012-01-27 LAB — PROTEIN C, TOTAL: Protein C, Total: 102 % (ref 72–160)

## 2012-01-27 LAB — HEPARIN LEVEL (UNFRACTIONATED)
Heparin Unfractionated: 0.18 IU/mL — ABNORMAL LOW (ref 0.30–0.70)
Heparin Unfractionated: 0.19 IU/mL — ABNORMAL LOW (ref 0.30–0.70)

## 2012-01-27 MED ORDER — MIDAZOLAM HCL 2 MG/2ML IJ SOLN
3.0000 mg | Freq: Once | INTRAMUSCULAR | Status: AC
  Administered 2012-01-27: 3 mg via INTRAVENOUS

## 2012-01-27 MED ORDER — SODIUM CHLORIDE 0.9 % IJ SOLN: 3.0000 mL | INTRAMUSCULAR | Status: DC | PRN

## 2012-01-27 MED ORDER — TENECTEPLASE 50 MG IV KIT
0.2000 mg/h | PACK | INTRAVENOUS | Status: DC
  Administered 2012-01-27: 0.5 mg/h
  Administered 2012-01-28: 0.2 mg/h
  Filled 2012-01-27 (×2): qty 1

## 2012-01-27 MED ORDER — HEPARIN BOLUS VIA INFUSION
2000.0000 [IU] | Freq: Once | INTRAVENOUS | Status: AC
  Administered 2012-01-27: 2000 [IU] via INTRAVENOUS
  Filled 2012-01-27: qty 2000

## 2012-01-27 MED ORDER — SODIUM CHLORIDE 0.9 % IV SOLN
0.2000 mg/h | INTRAVENOUS | Status: DC
  Administered 2012-01-27: 0.5 mg/h
  Administered 2012-01-28: 0.2 mg/h
  Filled 2012-01-27 (×2): qty 1

## 2012-01-27 MED ORDER — MORPHINE SULFATE 10 MG/ML IJ SOLN
5.0000 mg | INTRAMUSCULAR | Status: DC | PRN
  Administered 2012-01-27 – 2012-01-29 (×10): 5 mg via INTRAVENOUS
  Filled 2012-01-27 (×10): qty 1

## 2012-01-27 MED ORDER — SODIUM CHLORIDE 0.9 % IV BOLUS (SEPSIS)
500.0000 mL | Freq: Once | INTRAVENOUS | Status: AC
  Administered 2012-01-27: 500 mL via INTRAVENOUS

## 2012-01-27 MED ORDER — SODIUM CHLORIDE 0.9 % IV SOLN
250.0000 mL | INTRAVENOUS | Status: DC | PRN
  Administered 2012-01-27 – 2012-02-04 (×4): 250 mL via INTRAVENOUS

## 2012-01-27 MED ORDER — FENTANYL CITRATE 0.05 MG/ML IJ SOLN
150.0000 ug | Freq: Once | INTRAMUSCULAR | Status: AC
  Administered 2012-01-27: 150 ug via INTRAVENOUS

## 2012-01-27 MED ORDER — DOCUSATE SODIUM 100 MG PO CAPS
100.0000 mg | ORAL_CAPSULE | Freq: Two times a day (BID) | ORAL | Status: DC
  Administered 2012-01-27 – 2012-02-05 (×15): 100 mg via ORAL
  Filled 2012-01-27 (×19): qty 1

## 2012-01-27 MED ORDER — LIDOCAINE HCL 1 % IJ SOLN
INTRAMUSCULAR | Status: AC
  Filled 2012-01-27: qty 20

## 2012-01-27 MED ORDER — MIDAZOLAM HCL 5 MG/ML IJ SOLN: 1.0000 mg | INTRAMUSCULAR | Status: DC | PRN

## 2012-01-27 MED ORDER — SODIUM CHLORIDE 0.9 % IJ SOLN
3.0000 mL | Freq: Two times a day (BID) | INTRAMUSCULAR | Status: DC
  Administered 2012-01-27 – 2012-02-01 (×8): 3 mL via INTRAVENOUS

## 2012-01-27 NOTE — Procedures (Signed)
Venograms show extensive L pop, fem, iliac DVT; R fem and iliac DVT; caval thrombus to filter. Catheter directed TNK lysis started via bilat pop vv catheters. To ICU. Plan f/u veno in AM. No complication No blood loss. See complete dictation in [facility].

## 2012-01-27 NOTE — H&P (Signed)
Chief Complaint: (B)LE DVT HPI: [name] is an 46 y.o. male with prior hx of PE with placement of IVC filter in 2011. He is now admitted with acute B LE DVT L>R with worsening sxs. He is on therapeutic heparin. Request for IR eval for possible thrombolysis. No prior hx of major bleeds such as brain aneurysms, PUD, diverticular bleeds. He does drink extensively but has no knowledge of whether he has esophageal varices.  Past Medical History:  Past Medical History  Diagnosis Date  . Coronary artery disease   . Degenerative joint disease of spine   . Pulmonary embolism   . Degenerative joint disease     Past Surgical History:  Past Surgical History  Procedure Date  . Left elbow surgery   . Tonsillectomy     Family History: History reviewed. No pertinent family history.  Social History:  reports that he has been smoking Cigarettes.  He has been smoking about 3 packs per day. He has never used smokeless tobacco. He reports that he drinks alcohol. He reports that he does not use illicit drugs.  Allergies: No Known Allergies  Medications: Medications Prior to Admission  Medication Sig Dispense Refill  . ibuprofen (ADVIL,MOTRIN) 200 MG tablet Take 600 mg by mouth every 8 (eight) hours as needed. For pain.      . naproxen (NAPROSYN) 500 MG tablet Take 1 tablet (500 mg total) by mouth 2 (two) times daily.  30 tablet  0  . traMADol (ULTRAM) 50 MG tablet Take 50 mg by mouth every 8 (eight) hours as needed. pain        Please HPI for pertinent positives, otherwise complete 10 system ROS negative.  Physical Exam: Blood pressure 113/51, pulse 74, temperature 99.5 F (37.5 C), temperature source Oral, resp. rate 18, height 6\' 2"  (1.88 m), weight 175 lb (79.379 kg), SpO2 97.00%. Body mass index is 22.47 kg/(m^2).   General Appearance:  Alert, cooperative, no distress, appears stated age  Head:  Normocephalic, without obvious abnormality, atraumatic  ENT: Unremarkable  Neck: Supple,  symmetrical, trachea midline, no adenopathy, thyroid: not enlarged, symmetric, no tenderness/mass/nodules  Lungs:   Clear to auscultation bilaterally, no w/r/r, respirations unlabored without use of accessory muscles.  Heart:  Regular rate and rhythm, S1, S2 normal, no murmur, rub or gallop. Carotids 2+ without bruit.  Extremities: Significant 4+edema of (L)LE and 3+ edema of the R.   Pulses: Not palpable due to edema  Skin: Skin color, texture, turgor normal, no rashes or lesions  Neurologic: Normal affect, no gross deficits.   Results for orders placed during the hospital encounter of 01/25/12 (from the past 48 hour(s))  BASIC METABOLIC PANEL     Status: Abnormal   Collection Time   01/26/12  5:15 AM      Component Value Range Comment   Sodium 131 (*) 135 - 145 (mEq/L)    Potassium 3.7  3.5 - 5.1 (mEq/L)    Chloride 96  96 - 112 (mEq/L)    CO2 23  19 - 32 (mEq/L)    Glucose, Bld 94  70 - 99 (mg/dL)    BUN 11  6 - 23 (mg/dL)    Creatinine, Ser 6.21  0.50 - 1.35 (mg/dL)    Calcium 8.7  8.4 - 10.5 (mg/dL)    GFR calc non Af Amer >90  >90 (mL/min)    GFR calc Af Amer >90  >90 (mL/min)   CBC     Status: Abnormal   Collection  Time   01/26/12  5:15 AM      Component Value Range Comment   WBC 10.7 (*) 4.0 - 10.5 (K/uL)    RBC 3.20 (*) 4.22 - 5.81 (MIL/uL)    Hemoglobin 10.8 (*) 13.0 - 17.0 (g/dL)    HCT 16.1 (*) 09.6 - 52.0 (%)    MCV 102.8 (*) 78.0 - 100.0 (fL)    MCH 33.8  26.0 - 34.0 (pg)    MCHC 32.8  30.0 - 36.0 (g/dL)    RDW 04.5  40.9 - 81.1 (%)    Platelets 143 (*) 150 - 400 (K/uL)   ANTITHROMBIN III     Status: Abnormal   Collection Time   01/26/12  8:05 AM      Component Value Range Comment   AntiThromb III Func 74 (*) 75 - 120 (%)   PROTEIN C, TOTAL     Status: Normal   Collection Time   01/26/12  8:05 AM      Component Value Range Comment   Protein C, Total 102  72 - 160 (%)   PROTEIN S, TOTAL     Status: Normal   Collection Time   01/26/12  8:05 AM      Component  Value Range Comment   Protein S Ag, Total 100  60 - 150 (%)   HOMOCYSTEINE, SERUM     Status: Normal   Collection Time   01/26/12  8:05 AM      Component Value Range Comment   Homocysteine-Norm 12.8  4.0 - 15.4 (umol/L)   URINALYSIS, ROUTINE W REFLEX MICROSCOPIC     Status: Normal   Collection Time   01/26/12 11:51 AM      Component Value Range Comment   Color, Urine YELLOW  YELLOW     APPearance CLEAR  CLEAR     Specific Gravity, Urine 1.013  1.005 - 1.030     pH 5.5  5.0 - 8.0     Glucose, UA NEGATIVE  NEGATIVE (mg/dL)    Hgb urine dipstick NEGATIVE  NEGATIVE     Bilirubin Urine NEGATIVE  NEGATIVE     Ketones, ur NEGATIVE  NEGATIVE (mg/dL)    Protein, ur NEGATIVE  NEGATIVE (mg/dL)    Urobilinogen, UA 0.2  0.0 - 1.0 (mg/dL)    Nitrite NEGATIVE  NEGATIVE     Leukocytes, UA NEGATIVE  NEGATIVE  MICROSCOPIC NOT DONE ON URINES WITH NEGATIVE PROTEIN, BLOOD, LEUKOCYTES, NITRITE, OR GLUCOSE <1000 mg/dL.  HEPARIN LEVEL (UNFRACTIONATED)     Status: Normal   Collection Time   01/26/12 10:05 PM      Component Value Range Comment   Heparin Unfractionated 0.33  0.30 - 0.70 (IU/mL)   HEPARIN LEVEL (UNFRACTIONATED)     Status: Abnormal   Collection Time   01/27/12  5:25 AM      Component Value Range Comment   Heparin Unfractionated 0.18 (*) 0.30 - 0.70 (IU/mL)   CBC     Status: Abnormal   Collection Time   01/27/12  5:25 AM      Component Value Range Comment   WBC 10.7 (*) 4.0 - 10.5 (K/uL)    RBC 3.06 (*) 4.22 - 5.81 (MIL/uL)    Hemoglobin 10.3 (*) 13.0 - 17.0 (g/dL)    HCT 91.4 (*) 78.2 - 52.0 (%)    MCV 102.0 (*) 78.0 - 100.0 (fL)    MCH 33.7  26.0 - 34.0 (pg)    MCHC 33.0  30.0 - 36.0 (g/dL)  RDW 14.4  11.5 - 15.5 (%)    Platelets 189  150 - 400 (K/uL) DELTA CHECK NOTED  HEPARIN LEVEL (UNFRACTIONATED)     Status: Abnormal   Collection Time   01/27/12 11:52 AM      Component Value Range Comment   Heparin Unfractionated 0.19 (*) 0.30 - 0.70 (IU/mL)    Ct Femur Left Wo  Contrast  01/26/2012  *RADIOLOGY REPORT*  Clinical Data: Left thigh swelling, pain, edema, and erythema.  CT OF THE LEFT FEMUR WITHOUT CONTRAST  Technique:  Contiguous axial CT images were obtained to the left femur and multiplanar reformatting was performed.  Comparison: None.  Findings: Abnormal wall thickening is present in the urinary bladder with surrounding fluid in the prevesical space, and mesenteric and presacral edema noted in the lower pelvis.  Edema tracks along the left common femoral vasculature.  The left external iliac, common femoral, and superficial femoral vein is significantly denser than the corresponding artery, raising suspicion for possible arterial occlusion.  Small left inguinal lymph nodes are present.  In addition to the perivascular edema, there is diffuse subcutaneous edema in the left thigh as well as edema tracking along the deep fascial margins, especially anteriorly.  A small knee effusion is present.  No significant abnormal osseous lesions observed.  IMPRESSION:  1. Possible arterial occlusion in the left thigh- correlate with distal pulses and consider Doppler assessment or CT angiogram. 2.  Diffuse bladder wall thickening may reflect cystitis.  There is surrounding fluid in the prevesical space.  There is also pelvic mesenteric edema and presacral edema. 3.  Diffuse subcutaneous edema is present in the left thigh, with abnormal edema tracking along the fascial planes, but without drainable abscess observed.  There is also perivascular edema along the deep venous structures and adjacent arterial structures.  Original Report Authenticated By: Dellia Cloud, M.D.    Assessment/Plan (B)LE DVT L>R Discussed thrombolysis procedure with pt including risks, complications. Reviewed labs Consent signed.  Brayton El PA-C 01/27/2012, 4:34 PM

## 2012-01-27 NOTE — Progress Notes (Signed)
ANTICOAGULATION CONSULT NOTE - Follow Up Consult  Pharmacy Consult for Heparin Indication: DVT  No Known Allergies  Patient Measurements: Height: 6\' 2"  (188 cm) Weight: 175 lb (79.379 kg) IBW/kg (Calculated) : 82.2   Vital Signs: Temp: 99.5 F (37.5 C) (05/22 0607) Temp src: Oral (05/22 0607) BP: 98/56 mmHg (05/22 0622) Pulse Rate: 77  (05/22 0607)  Labs:  Alvira Philips 01/27/12 0525 01/26/12 2205 01/26/12 0515 01/25/12 1318 01/25/12 1316 01/25/12 1310  HGB 10.3* -- 10.8* -- -- --  HCT 31.2* -- 32.9* 38.0* -- --  PLT 189 -- 143* -- -- 128*  APTT -- -- -- -- -- --  LABPROT -- -- -- -- -- --  INR -- -- -- -- -- --  HEPARINUNFRC 0.18* 0.33 -- -- -- --  CREATININE -- -- 0.70 0.90 -- --  CKTOTAL -- -- -- -- -- --  CKMB -- -- -- -- -- --  TROPONINI -- -- -- -- <0.30 --    Estimated Creatinine Clearance: 129.6 ml/min (by C-G formula based on Cr of 0.7).   Medications:  Infusions:     . heparin 1,650 Units/hr (01/27/12 5621)    Assessment:  45 YOM started on Lovenox 5/20 for bilateral DVTs.  Pt is a homeless male with h/o PE placed on Coumadin in the past but has not taken it x6 months, patient has an IVC filter in place.   R/o arterial occlusion pending CT results so Dr. Isidoro Donning has changed Lovenox to IV heparin to begin 5/21 at 1400  CBC has decreased, but no complications or bleeding is reported  Heparin level is subtherapeutic at 0.19  RN reports no interruptions or complications  Goal of Therapy:  Heparin level 0.3-0.7 units/ml Monitor platelets by anticoagulation protocol: Yes   Plan:   Heparin bolus 2000 units IV x1  Continue heparin IV infusion, increase to 1800 units/hr (18 ml/hr)  Heparin level in 6 hours  Daily Heparin level and CBC   Lynann Beaver PharmD, BCPS Pager [phone] 01/27/2012 12:46 PM

## 2012-01-27 NOTE — Progress Notes (Signed)
Patient ID: [name]  male  EAV:[id number]    DOB: 02/06/1966    DOA: 01/25/2012  PCP: No primary provider on file.  Subjective: Patient complaining of pain and swelling in his left lower extremity. Patient denies any CP and SOB.  Objective: Weight change:   Intake/Output Summary (Last 24 hours) at 01/27/12 1437 Last data filed at 01/27/12 1422  Gross per 24 hour  Intake    720 ml  Output   1550 ml  Net   -830 ml   Blood pressure 113/51, pulse 74, temperature 99.5 F (37.5 C), temperature source Oral, resp. rate 18, height 6\' 2"  (1.88 m), weight 79.379 kg (175 lb), SpO2 97.00%.  Physical Exam: General: Alert and awake, oriented x3, not in any acute distress. HEENT: anicteric sclera, pupils reactive to light and accommodation, EOMI CVS: S1-S2 clear, no murmur rubs or gallops Chest: clear to auscultation bilaterally, no wheezing, rales or rhonchi Abdomen: soft nontender, nondistended, normal bowel sounds, no organomegaly Extremities: no cyanosis, clubbing. Left > right LE edema, worse in left thigh   Lab Results: Basic Metabolic Panel:  Lab 01/26/12 7829 01/25/12 1318 01/22/12 1317  NA 131* 131* --  K 3.7 4.0 --  CL 96 99 --  CO2 23 -- 22  GLUCOSE 94 90 --  BUN 11 5* --  CREATININE 0.70 0.90 --  CALCIUM 8.7 -- 8.3*  MG -- -- --  PHOS -- -- --   CBC:  Lab 01/27/12 0525 01/26/12 0515 01/25/12 1310  WBC 10.7* 10.7* --  NEUTROABS -- -- 11.0*  HGB 10.3* 10.8* --  HCT 31.2* 32.9* --  MCV 102.0* 102.8* --  PLT 189 143* --   Cardiac Enzymes:  Lab 01/25/12 1316  CKTOTAL --  CKMB --  CKMBINDEX --  TROPONINI <0.30    Studies/Results: Dg Chest 2 View  01/25/2012  *RADIOLOGY REPORT*  Clinical Data: Cough, dyspnea  CHEST - 2 VIEW  Comparison: 01/22/2012 CT of the chest  Findings: Cardiomediastinal silhouette is stable.  No acute infiltrate or pleural effusion.  No pulmonary edema.  Calcified granuloma left lower lobe is stable.  IMPRESSION: No active disease.  No  significant change.  Original Report Authenticated By: Natasha Mead, M.D.   Ct Angio Chest W/cm &/or Wo Cm   IMPRESSION: No definite evidence of pulmonary embolism identified on exam limited by suboptimal pulmonary arterial opacification, despite repeating exam. Few scattered chronic lung changes as above. No definite acute intrathoracic abnormality identified.  Original Report Authenticated By: Lollie Marrow, M.D.   Ct Femur Left Wo Contrast  .  IMPRESSION:  1. Possible arterial occlusion in the left thigh- correlate with distal pulses and consider Doppler assessment or CT angiogram. 2.  Diffuse bladder wall thickening may reflect cystitis.  There is surrounding fluid in the prevesical space.  There is also pelvic mesenteric edema and presacral edema. 3.  Diffuse subcutaneous edema is present in the left thigh, with abnormal edema tracking along the fascial planes, but without drainable abscess observed.  There is also perivascular edema along the deep venous structures and adjacent arterial structures.  Original Report Authenticated By: Dellia Cloud, M.D.    Medications: Scheduled Meds:    . cefTRIAXone (ROCEPHIN)  IV  1 g Intravenous Q24H  . heparin  2,000 Units Intravenous Once  .  morphine injection  4 mg Intravenous Once  . sodium chloride  500 mL Intravenous Once   Continuous Infusions:    . heparin 1,800 Units/hr (01/27/12 1400)  Assessment/Plan: Active Problems:  Bilateral lower extremity DVT with extensive clot, left thigh edema - Ordered stat CT left femur yesterday to rule out any hematoma, compartment syndrome given patient did have falls in the recent past and started on anticoagulation. Stat CT left femur did not show any hematoma or compartment syndrome/fasciitis however raised the question of possible arterial occlusion. - I DC'ed the Lovenox, place him on heparin drip per pharmacy.  - Dr Isidoro Donning discussed in detail with vascular surgery, Dr. Myra Gianotti (on-call),  who recommended to obtain stat CT angiogram which was done with prelim results negative. CT of the left high showed possible arterial occlusion in the left thigh. Patient did not have the signs for acute ischemia, Doppler was not available. stat CT angiogram was done which per preliminary report by Dr. Ova Freshwater (radiology ) and Dr. Myra Gianotti (vascular surgery) is negative for any acute arterial occlusion.  - IR consult placed yesterday per Dr Isidoro Donning for evaluation if he is possibly candidate for thrombolysis given the extensive bilateral DVT. -  hypercoagulable panel pending    Cystitis on the CT imaging - Urine culture pending. - Continue IV Rocephin   Code Status: Full code  Disposition: Not medically ready   LOS: 2 days   [facility] M.D. [facility] 01/27/2012, 2:37 PM Pager: [phone]     [facility] M.D. [facility] 01/27/2012, 2:37 PM  Pager: [phone]

## 2012-01-27 NOTE — Progress Notes (Signed)
Pt. Transported to Radiology for thrombolysis, will transfer to ICU bed after procedure.

## 2012-01-27 NOTE — Progress Notes (Signed)
ANTICOAGULATION CONSULT NOTE - Follow Up Consult  Pharmacy Consult for Heparin Indication: DVT  No Known Allergies  Patient Measurements: Height: 6\' 2"  (188 cm) Weight: 175 lb (79.379 kg) IBW/kg (Calculated) : 82.2  Heparin Dosing Weight:   Vital Signs: Temp: 100.1 F (37.8 C) (05/21 2106) Temp src: Oral (05/21 2106) BP: 95/54 mmHg (05/21 2106) Pulse Rate: 79  (05/21 2106)  Labs:  Basename 01/27/12 0525 01/26/12 2205 01/26/12 0515 01/25/12 1318 01/25/12 1316 01/25/12 1310  HGB 10.3* -- 10.8* -- -- --  HCT 31.2* -- 32.9* 38.0* -- --  PLT 189 -- 143* -- -- 128*  APTT -- -- -- -- -- --  LABPROT -- -- -- -- -- --  INR -- -- -- -- -- --  HEPARINUNFRC 0.18* 0.33 -- -- -- --  CREATININE -- -- 0.70 0.90 -- --  CKTOTAL -- -- -- -- -- --  CKMB -- -- -- -- -- --  TROPONINI -- -- -- -- <0.30 --    Estimated Creatinine Clearance: 129.6 ml/min (by C-G formula based on Cr of 0.7).   Medications:  Infusions:    . heparin 1,500 Units/hr (01/26/12 1340)    Assessment: Patient with 2nd level low.  No issues per RN.  Goal of Therapy:  Heparin level 0.3-0.7 units/ml Monitor platelets by anticoagulation protocol: Yes   Plan:  Increase heparin to 1650 units/hr, recheck level at [address], [place] 01/27/2012,6:05 AM

## 2012-01-27 NOTE — Progress Notes (Signed)
ANTICOAGULATION CONSULT NOTE - Follow Up Consult  Pharmacy Consult for heparin Indication: DVT  No Known Allergies  Patient Measurements: Height: 6\' 2"  (188 cm) Weight: 175 lb (79.379 kg) IBW/kg (Calculated) : 82.2  Heparin Dosing Weight:   Vital Signs: Temp: 100.1 F (37.8 C) (05/21 2106) Temp src: Oral (05/21 2106) BP: 95/54 mmHg (05/21 2106) Pulse Rate: 79  (05/21 2106)  Labs:  Basename 01/26/12 2205 01/26/12 0515 01/25/12 1318 01/25/12 1316 01/25/12 1310  HGB -- 10.8* 12.9* -- --  HCT -- 32.9* 38.0* -- 35.1*  PLT -- 143* -- -- 128*  APTT -- -- -- -- --  LABPROT -- -- -- -- --  INR -- -- -- -- --  HEPARINUNFRC 0.33 -- -- -- --  CREATININE -- 0.70 0.90 -- --  CKTOTAL -- -- -- -- --  CKMB -- -- -- -- --  TROPONINI -- -- -- <0.30 --    Estimated Creatinine Clearance: 129.6 ml/min (by C-G formula based on Cr of 0.7).   Medications:  Infusions:    . sodium chloride 75 mL/hr (01/25/12 1645)  . heparin 1,500 Units/hr (01/26/12 1340)    Assessment: Patient with heparin level at goal after change from lovenox to heparin.  No issues with heparin per RN.  Goal of Therapy:  Heparin level 0.3-0.7 units/ml Monitor platelets by anticoagulation protocol: Yes   Plan:  Continue heparin at current rate.  Will follow up with am labs  Aleene Davidson [place] 01/27/2012,2:02 AM

## 2012-01-27 NOTE — Progress Notes (Addendum)
PHARMACY BRIEF NOTE - ANTICOAGULATION  Consult for:  IV Heparin Indication:  Bilateral DVTs  Goal of Therapy:   Heparin level 0.2-0.5 units/ml while tenecteplase infusing  Monitor platelets by anticoagulation protocol: Yes   Assessment: With infusion of 1800 units/hr, Heparin level is reported as 0.22 units/ml, within the reduced range specified by the tenecteplase infusion protocol.  Plan:  Increase the infusion rate to 1850 units/hr to assure level above 0.2 units/ml overnight.  Recheck level with a.m. labs.  Polo Riley R.Ph. 01/27/2012  9:41 PM

## 2012-01-27 NOTE — Progress Notes (Signed)
UR completed 

## 2012-01-28 ENCOUNTER — Inpatient Hospital Stay (HOSPITAL_COMMUNITY): Payer: Medicaid Other

## 2012-01-28 DIAGNOSIS — D649 Anemia, unspecified: Secondary | ICD-10-CM

## 2012-01-28 DIAGNOSIS — M79609 Pain in unspecified limb: Secondary | ICD-10-CM

## 2012-01-28 DIAGNOSIS — G459 Transient cerebral ischemic attack, unspecified: Secondary | ICD-10-CM

## 2012-01-28 DIAGNOSIS — E876 Hypokalemia: Secondary | ICD-10-CM | POA: Diagnosis not present

## 2012-01-28 DIAGNOSIS — E871 Hypo-osmolality and hyponatremia: Secondary | ICD-10-CM | POA: Diagnosis not present

## 2012-01-28 DIAGNOSIS — I82403 Acute embolism and thrombosis of unspecified deep veins of lower extremity, bilateral: Secondary | ICD-10-CM | POA: Diagnosis present

## 2012-01-28 LAB — BASIC METABOLIC PANEL
BUN: 7 mg/dL (ref 6–23)
CO2: 24 mEq/L (ref 19–32)
Chloride: 97 mEq/L (ref 96–112)
Creatinine, Ser: 0.6 mg/dL (ref 0.50–1.35)

## 2012-01-28 LAB — CBC
HCT: 29.2 % — ABNORMAL LOW (ref 39.0–52.0)
Hemoglobin: 9.5 g/dL — ABNORMAL LOW (ref 13.0–17.0)
MCH: 33.7 pg (ref 26.0–34.0)
MCV: 101.7 fL — ABNORMAL HIGH (ref 78.0–100.0)
MCV: 102.1 fL — ABNORMAL HIGH (ref 78.0–100.0)
Platelets: 145 10*3/uL — ABNORMAL LOW (ref 150–400)
RBC: 2.87 MIL/uL — ABNORMAL LOW (ref 4.22–5.81)
RBC: 2.82 MIL/uL — ABNORMAL LOW (ref 4.22–5.81)
RDW: 14.3 % (ref 11.5–15.5)
WBC: 11.8 10*3/uL — ABNORMAL HIGH (ref 4.0–10.5)

## 2012-01-28 LAB — URINALYSIS, ROUTINE W REFLEX MICROSCOPIC
Glucose, UA: NEGATIVE mg/dL
Leukocytes, UA: NEGATIVE
Specific Gravity, Urine: 1.004 — ABNORMAL LOW (ref 1.005–1.030)
pH: 6.5 (ref 5.0–8.0)

## 2012-01-28 LAB — FACTOR 5 LEIDEN

## 2012-01-28 LAB — HEPARIN LEVEL (UNFRACTIONATED): Heparin Unfractionated: 0.18 IU/mL — ABNORMAL LOW (ref 0.30–0.70)

## 2012-01-28 LAB — FIBRINOGEN: Fibrinogen: 205 mg/dL (ref 204–475)

## 2012-01-28 MED ORDER — LORAZEPAM 1 MG PO TABS
1.0000 mg | ORAL_TABLET | Freq: Four times a day (QID) | ORAL | Status: AC | PRN
  Administered 2012-01-30 – 2012-01-31 (×3): 1 mg via ORAL
  Filled 2012-01-28 (×3): qty 1

## 2012-01-28 MED ORDER — POTASSIUM CHLORIDE CRYS ER 20 MEQ PO TBCR
40.0000 meq | EXTENDED_RELEASE_TABLET | Freq: Once | ORAL | Status: AC
  Administered 2012-01-28: 40 meq via ORAL
  Filled 2012-01-28: qty 2

## 2012-01-28 MED ORDER — IOHEXOL 300 MG/ML  SOLN: 70.0000 mL | Freq: Once | INTRAMUSCULAR | Status: AC | PRN

## 2012-01-28 MED ORDER — THIAMINE HCL 100 MG/ML IJ SOLN
100.0000 mg | Freq: Every day | INTRAMUSCULAR | Status: DC
  Administered 2012-01-31: 100 mg via INTRAVENOUS
  Filled 2012-01-28 (×9): qty 1

## 2012-01-28 MED ORDER — VITAMIN B-1 100 MG PO TABS
100.0000 mg | ORAL_TABLET | Freq: Every day | ORAL | Status: DC
  Administered 2012-01-28 – 2012-02-05 (×8): 100 mg via ORAL
  Filled 2012-01-28 (×9): qty 1

## 2012-01-28 MED ORDER — ADULT MULTIVITAMIN W/MINERALS CH
1.0000 | ORAL_TABLET | Freq: Every day | ORAL | Status: DC
  Administered 2012-01-28 – 2012-02-05 (×9): 1 via ORAL
  Filled 2012-01-28 (×9): qty 1

## 2012-01-28 MED ORDER — FOLIC ACID 1 MG PO TABS
1.0000 mg | ORAL_TABLET | Freq: Every day | ORAL | Status: DC
  Administered 2012-01-28 – 2012-02-05 (×9): 1 mg via ORAL
  Filled 2012-01-28 (×9): qty 1

## 2012-01-28 MED ORDER — SODIUM CHLORIDE 0.9 % IV SOLN
INTRAVENOUS | Status: DC
  Administered 2012-01-28 (×2): via INTRAVENOUS
  Administered 2012-01-29: 75 mL via INTRAVENOUS
  Administered 2012-01-29 – 2012-01-30 (×2): via INTRAVENOUS

## 2012-01-28 MED ORDER — LORAZEPAM 2 MG/ML IJ SOLN: 1.0000 mg | Freq: Four times a day (QID) | INTRAMUSCULAR | Status: AC | PRN

## 2012-01-28 NOTE — Clinical Social Work Note (Signed)
CSW made aware on rounds that Pt has ETOH concerns and is homeless. CSW will complete full assessment with Pt in the am.  [name], [name]  01/28/2012 12:20 PM #[phone]

## 2012-01-28 NOTE — Procedures (Signed)
Post-Procedure Note  Pre-operative Diagnosis: Bilateral DVT and IVC thrombus       Post-operative Diagnosis: same   Indications: Bilateral leg swelling and IVC thrombus  Procedure Details:   Bilateral venograms through leg sheaths.  Findings: There is improved flow in both lower extremity veins with residual non-occlusive thrombus.  There is flow in the IVC but residual clot and irregularity in IVC, below the filter.  Complications: None     Condition: good  Plan: Continue thrombolytics overnight.  Follow up venograms on 01/29/12.  Anticipate that the thrombolysis will be complete tomorrow.  Need to evaluate for stent placement in left iliac vein after the thrombus has resolved.  Continue to follow fibrinogen.

## 2012-01-28 NOTE — Progress Notes (Signed)
ANTICOAGULATION CONSULT NOTE - Follow Up Consult  Pharmacy Consult for heparin Indication: DVT, with TNKase running  No Known Allergies  Patient Measurements: Height: 6\' 2"  (188 cm) Weight: 176 lb 9.4 oz (80.1 kg) IBW/kg (Calculated) : 82.2   Vital Signs: Temp: 98.7 F (37.1 C) (05/23 1200) Temp src: Oral (05/23 1200) BP: 110/57 mmHg (05/23 1030) Pulse Rate: 88  (05/23 1030)  Labs:  Basename 01/28/12 1030 01/28/12 0530 01/28/12 0255 01/27/12 1910 01/27/12 0525 01/26/12 0515 01/25/12 1318 01/25/12 1316  HGB -- 9.5* -- 10.3* -- -- -- --  HCT -- 29.2* -- 30.9* 31.2* -- -- --  PLT -- 133* -- 212 189 -- -- --  APTT -- -- -- -- -- -- -- --  LABPROT -- -- -- -- -- -- -- --  INR -- -- -- -- -- -- -- --  HEPARINUNFRC 0.14* -- 0.18* 0.22* -- -- -- --  CREATININE -- 0.60 -- -- -- 0.70 0.90 --  CKTOTAL -- -- -- -- -- -- -- --  CKMB -- -- -- -- -- -- -- --  TROPONINI -- -- -- -- -- -- -- <0.30    Estimated Creatinine Clearance: 130.7 ml/min (by C-G formula based on Cr of 0.6).  Assessment:  46 YO homeless male with h/o PE placed on Coumadin in the past but has not taken it x 6 months, patient has an IVC filter in place. Patient presented 5/17 with L ankle pain and swelling. LLE venous duplex at the time negative for DVT (+ for superficial thrombosis).   Patient returns 5/20 with similar complaints and preliminary BLE venous duplex now with bilateral DVT. Lovenox for VTE treatment. On 5/21 - arterial occlusion noted and Dr. Isidoro Donning has changed the Lovenox to IV heparin  On 5/22, IR started tenecteplase infusion (2sites) and heparin level goal decreased to 0.2 - 0.5. Patient with oozing blood from IV and catheter sites per MD notes.  Currently heparin is running at 1900 units/hr and HL returns low at 0.14.  Discussed with Dr. Lowella Dandy from radiology who recommended to not increase heparin further despite low level.  The plan is for patient to return to radiology and reassess.  Dr. Lowella Dandy thinks  likely pt will need to be on tenecteplase.    Heparin level 0.2-0.5 units/ml Monitor platelets by anticoagulation protocol: Yes   Plan:   Continue heparin at 1900 units/hr.  Will not aim for goal heparin level of 0.2 - 0.5.    Heparin level in AM only  F/u with radiology for further plans.    Geoffry Paradise Thi 01/28/2012,1:04 PM

## 2012-01-28 NOTE — Progress Notes (Signed)
ANTICOAGULATION CONSULT NOTE - Follow Up Consult  Pharmacy Consult for heparin Indication: DVT, with TNKase running  No Known Allergies  Patient Measurements: Height: 6\' 2"  (188 cm) Weight: 176 lb 9.4 oz (80.1 kg) IBW/kg (Calculated) : 82.2  Heparin Dosing Weight:   Vital Signs: Temp: 100.4 F (38 C) (05/23 0000) Temp src: Oral (05/23 0000) BP: 108/63 mmHg (05/23 0300) Pulse Rate: 83  (05/23 0300)  Labs:  Basename 01/28/12 0255 01/27/12 1910 01/27/12 1152 01/27/12 0525 01/26/12 0515 01/25/12 1318 01/25/12 1316  HGB -- 10.3* -- 10.3* -- -- --  HCT -- 30.9* -- 31.2* 32.9* -- --  PLT -- 212 -- 189 143* -- --  APTT -- -- -- -- -- -- --  LABPROT -- -- -- -- -- -- --  INR -- -- -- -- -- -- --  HEPARINUNFRC 0.18* 0.22* 0.19* -- -- -- --  CREATININE -- -- -- -- 0.70 0.90 --  CKTOTAL -- -- -- -- -- -- --  CKMB -- -- -- -- -- -- --  TROPONINI -- -- -- -- -- -- <0.30    Estimated Creatinine Clearance: 130.7 ml/min (by C-G formula based on Cr of 0.7).   Medications:  Infusions:    . heparin 1,850 Units/hr (01/28/12 0300)  . tenecteplase (TNKase) infusion 0.25 mg/hr (01/27/12 1816)  . tenecteplase (TNKase) infusion 0.5 mg/hr (01/28/12 0300)    Assessment: Patient with serous drainage note at Left leg site per RN and RN note.  MDs aware.  Heparin level low.   Goal of Therapy:  Heparin level 0.2-0.5 units/ml Monitor platelets by anticoagulation protocol: Yes   Plan:  Will increase to 1900 units/hr to keep within desired goal, will follow up with drainage and MD plan.  Next heparin level at 1000  [address], [place] 01/28/2012,4:21 AM

## 2012-01-28 NOTE — Progress Notes (Signed)
LLE site has leaked onto the pillow, clear fluid noted, no yellowish tent. Blue Cath noted about 1/2inch from under guaze which in unchanged. Watts with Radiology informed. No new orders given.

## 2012-01-28 NOTE — Progress Notes (Signed)
Patient ID: [name]  male  AVW:[id number]    DOB: 1966-09-02    DOA: 01/25/2012  PCP: No primary provider on file.  Subjective: Patient with some improvement in LLE swelling and pain post thrombolysis. Patient with blood oozing around IV site and catheter site per nursing. Patient denies any CP and SOB.  Objective: Weight change:   Intake/Output Summary (Last 24 hours) at 01/28/12 0840 Last data filed at 01/28/12 0800  Gross per 24 hour  Intake 3979.76 ml  Output   2825 ml  Net 1154.76 ml   Blood pressure 98/56, pulse 79, temperature 98.4 F (36.9 C), temperature source Oral, resp. rate 12, height 6\' 2"  (1.88 m), weight 80.1 kg (176 lb 9.4 oz), SpO2 95.00%.  Physical Exam: General: Alert and awake, oriented x3, not in any acute distress. HEENT: anicteric sclera, pupils reactive to light and accommodation, EOMI CVS: S1-S2 clear, no murmur rubs or gallops Chest: clear to auscultation bilaterally, no wheezing, rales or rhonchi in anterior lung fields. Abdomen: soft nontender, nondistended, normal bowel sounds, no organomegaly Extremities: no cyanosis, clubbing. Left LE with 3 + edema > right LE 1 +edema, worse in left thigh   Lab Results: Basic Metabolic Panel:  Lab 01/28/12 8295 01/26/12 0515  NA 129* 131*  K 3.4* 3.7  CL 97 96  CO2 24 23  GLUCOSE 110* 94  BUN 7 11  CREATININE 0.60 0.70  CALCIUM 7.9* 8.7  MG -- --  PHOS -- --   CBC:  Lab 01/28/12 0530 01/27/12 1910 01/25/12 1310  WBC 11.8* 11.9* --  NEUTROABS -- -- 11.0*  HGB 9.5* 10.3* --  HCT 29.2* 30.9* --  MCV 101.7* 101.6* --  PLT 133* 212 --   Cardiac Enzymes:  Lab 01/25/12 1316  CKTOTAL --  CKMB --  CKMBINDEX --  TROPONINI <0.30    Studies/Results: Dg Chest 2 View  01/25/2012  *RADIOLOGY REPORT*  Clinical Data: Cough, dyspnea  CHEST - 2 VIEW  Comparison: 01/22/2012 CT of the chest  Findings: Cardiomediastinal silhouette is stable.  No acute infiltrate or pleural effusion.  No pulmonary edema.   Calcified granuloma left lower lobe is stable.  IMPRESSION: No active disease.  No significant change.  Original Report Authenticated By: Natasha Mead, M.D.   Ct Angio Chest W/cm &/or Wo Cm   IMPRESSION: No definite evidence of pulmonary embolism identified on exam limited by suboptimal pulmonary arterial opacification, despite repeating exam. Few scattered chronic lung changes as above. No definite acute intrathoracic abnormality identified.  Original Report Authenticated By: Lollie Marrow, M.D.   Ct Femur Left Wo Contrast  .  IMPRESSION:  1. Possible arterial occlusion in the left thigh- correlate with distal pulses and consider Doppler assessment or CT angiogram. 2.  Diffuse bladder wall thickening may reflect cystitis.  There is surrounding fluid in the prevesical space.  There is also pelvic mesenteric edema and presacral edema. 3.  Diffuse subcutaneous edema is present in the left thigh, with abnormal edema tracking along the fascial planes, but without drainable abscess observed.  There is also perivascular edema along the deep venous structures and adjacent arterial structures.  Original Report Authenticated By: Dellia Cloud, M.D.    Medications: Scheduled Meds:    . cefTRIAXone (ROCEPHIN)  IV  1 g Intravenous Q24H  . docusate sodium  100 mg Oral BID  . fentaNYL  150 mcg Intravenous Once  . heparin  2,000 Units Intravenous Once  . lidocaine      .  midazolam  3 mg Intravenous Once  . potassium chloride  40 mEq Oral Once  . sodium chloride  3 mL Intravenous Q12H   Continuous Infusions:    . sodium chloride    . heparin 1,900 Units/hr (01/28/12 0800)  . tenecteplase (TNKase) infusion 0.25 mg/hr (01/27/12 1816)  . tenecteplase (TNKase) infusion 0.5 mg/hr (01/28/12 0800)     Assessment/Plan: Active Problems:  Bilateral lower extremity DVT with extensive clot, left thigh edema - S/P CATHETER DIRECTED TNK lysis 01/27/12 per IR -  CT left femur done to rule out any  hematoma, compartment syndrome given patient did have falls in the recent past and started on anticoagulation. Stat CT left femur did not show any hematoma or compartment syndrome/fasciitis however raised the question of possible arterial occlusion. - Dr Isidoro Donning discussed in detail with vascular surgery, Dr. Myra Gianotti (on-call), who recommended to obtain stat CT angiogram which was done with prelim results negative. CT of the left high showed possible arterial occlusion in the left thigh. Patient did not have the signs for acute ischemia, Doppler was not available. stat CT angiogram was done which per preliminary report by Dr. Ova Freshwater (radiology ) and Dr. Myra Gianotti (vascular surgery) is negative for any acute arterial occlusion.  -  hypercoagulable panel pending   Patient with some oozing of blood around IV site and catheter site.  Continue IV heparin and TNK per IR Will defer to IR when to start coumadin May need malignancy work up, CT chest negative. Will need colonscopy as outpatient. IR ff and appreciate input and rxcs.  Cystitis on the CT imaging - Urine culture pending. - Continue IV Rocephin  Hypokalemia Replete  Hyponatremia Likely secondary to volume depletion vs beer potomania Will check a FENA. CT chest negative. IVF. Follow  Anemia Likely secondary to oozing of blood in setting of heparin and TNK. Will check an anemia panel. Follow H/H. Transfusion threshhold Hgb <8.   Hx ETOH abuse Stable. No s/sx of withdrawal. Place on CIWA protocol.     Code Status: Full code  Disposition: Not medically ready   LOS: 3 days   [facility] M.D. [facility] 01/28/2012, 8:40 AM Pager: [phone]

## 2012-01-28 NOTE — Progress Notes (Signed)
Left Leg TPA insertion site (calf) dressing  shows serous drainage. Site marked around 0200. Pressure Held for with about 1/4in more drainage outside of previously marked area. Dressing also feels wet compared to right leg dressing. Veronia Beets NP from [facility] made aware. Heparin and Fibrinogen level ordered. Results called to Dr.Watts with radiology.  Orders to continue TPA and monitor patient.

## 2012-01-29 ENCOUNTER — Encounter (HOSPITAL_COMMUNITY): Payer: Self-pay | Admitting: Physician Assistant

## 2012-01-29 ENCOUNTER — Inpatient Hospital Stay (HOSPITAL_COMMUNITY): Payer: Medicaid Other

## 2012-01-29 DIAGNOSIS — I749 Embolism and thrombosis of unspecified artery: Secondary | ICD-10-CM

## 2012-01-29 DIAGNOSIS — D649 Anemia, unspecified: Secondary | ICD-10-CM

## 2012-01-29 DIAGNOSIS — I82409 Acute embolism and thrombosis of unspecified deep veins of unspecified lower extremity: Principal | ICD-10-CM

## 2012-01-29 DIAGNOSIS — M79609 Pain in unspecified limb: Secondary | ICD-10-CM

## 2012-01-29 DIAGNOSIS — E871 Hypo-osmolality and hyponatremia: Secondary | ICD-10-CM

## 2012-01-29 LAB — CARDIOLIPIN ANTIBODIES, IGG, IGM, IGA
Anticardiolipin IgG: 6 GPL U/mL — ABNORMAL LOW (ref <23)
Anticardiolipin IgM: 3 MPL U/mL — ABNORMAL LOW (ref <11)

## 2012-01-29 LAB — URINE CULTURE
Colony Count: NO GROWTH
Culture  Setup Time: 201305240148

## 2012-01-29 LAB — CBC
Hemoglobin: 9.3 g/dL — ABNORMAL LOW (ref 13.0–17.0)
MCH: 33.2 pg (ref 26.0–34.0)
MCHC: 32.4 g/dL (ref 30.0–36.0)
MCV: 102.5 fL — ABNORMAL HIGH (ref 78.0–100.0)

## 2012-01-29 LAB — BASIC METABOLIC PANEL
Calcium: 7.8 mg/dL — ABNORMAL LOW (ref 8.4–10.5)
GFR calc Af Amer: >90 mL/min (ref >90)
GFR calc non Af Amer: >90 mL/min (ref >90)
Glucose, Bld: 92 mg/dL (ref 70–99)
Potassium: 3.7 mEq/L (ref 3.5–5.1)
Sodium: 137 mEq/L (ref 135–145)

## 2012-01-29 LAB — FERRITIN: Ferritin: 926 ng/mL — ABNORMAL HIGH (ref 22–322)

## 2012-01-29 LAB — HEPARIN LEVEL (UNFRACTIONATED): Heparin Unfractionated: 0.2 IU/mL — ABNORMAL LOW (ref 0.30–0.70)

## 2012-01-29 LAB — VITAMIN B12: Vitamin B-12: 146 pg/mL — ABNORMAL LOW (ref 211–911)

## 2012-01-29 LAB — MAGNESIUM: Magnesium: 1.7 mg/dL (ref 1.5–2.5)

## 2012-01-29 LAB — IRON AND TIBC: Saturation Ratios: 16 % — ABNORMAL LOW (ref 20–55)

## 2012-01-29 MED ORDER — HEPARIN (PORCINE) IN NACL 100-0.45 UNIT/ML-% IJ SOLN
2500.0000 [IU]/h | INTRAMUSCULAR | Status: DC
  Administered 2012-01-29: 2100 [IU]/h via INTRAVENOUS
  Administered 2012-01-30 – 2012-02-04 (×13): 2500 [IU]/h via INTRAVENOUS
  Filled 2012-01-29 (×20): qty 250

## 2012-01-29 MED ORDER — IOHEXOL 300 MG/ML  SOLN
110.0000 mL | Freq: Once | INTRAMUSCULAR | Status: AC | PRN
  Administered 2012-01-29: 110 mL via INTRAVENOUS

## 2012-01-29 MED ORDER — FENTANYL CITRATE 0.05 MG/ML IJ SOLN
INTRAMUSCULAR | Status: AC | PRN
  Administered 2012-01-29 (×2): 100 ug via INTRAVENOUS

## 2012-01-29 MED ORDER — PANTOPRAZOLE SODIUM 40 MG PO TBEC
40.0000 mg | DELAYED_RELEASE_TABLET | Freq: Every day | ORAL | Status: DC
  Administered 2012-01-29 – 2012-02-04 (×7): 40 mg via ORAL
  Filled 2012-01-29 (×8): qty 1

## 2012-01-29 MED ORDER — MAGNESIUM SULFATE 40 MG/ML IJ SOLN
2.0000 g | Freq: Once | INTRAMUSCULAR | Status: AC
  Administered 2012-01-29: 2 g via INTRAVENOUS
  Filled 2012-01-29 (×2): qty 50

## 2012-01-29 MED ORDER — MIDAZOLAM HCL 5 MG/5ML IJ SOLN
INTRAMUSCULAR | Status: AC | PRN
  Administered 2012-01-29 (×2): 2 mg via INTRAVENOUS

## 2012-01-29 NOTE — Clinical Social Work Note (Signed)
CSW continues to follow. Full assessment done by [facility] Crosby on 5/21. Pt requesting admission at [facility] for ETOH TX at d/c. Pt in procedure. CSW will continue to follow and assist with Pt needs closer to d/c.  Vennie Homans, [place] 01/29/2012 11:38 AM [phone]

## 2012-01-29 NOTE — ED Notes (Signed)
Procedure done.

## 2012-01-29 NOTE — ED Notes (Signed)
Incision made

## 2012-01-29 NOTE — Progress Notes (Addendum)
ANTICOAGULATION CONSULT NOTE - Follow Up Consult  Pharmacy Consult for IV heparin Indication: Bilateral DVT  No Known Allergies  Patient Measurements: Height: 6\' 2"  (188 cm) Weight: 176 lb 9.4 oz (80.1 kg) IBW/kg (Calculated) : 82.2   Vital Signs: Temp: 98.9 F (37.2 C) (05/24 0800) Temp src: Oral (05/24 0800) BP: 128/68 mmHg (05/24 1135) Pulse Rate: 80  (05/24 1135)  Labs:  Basename 01/29/12 0421 01/28/12 1700 01/28/12 1030 01/28/12 0530 01/28/12 0255  HGB 9.3* 9.5* -- -- --  HCT 28.7* 28.8* -- 29.2* --  PLT 151 145* -- 133* --  APTT -- -- -- -- --  LABPROT -- -- -- -- --  INR -- -- -- -- --  HEPARINUNFRC 0.16* -- 0.14* -- 0.18*  CREATININE 0.59 -- -- 0.60 --  CKTOTAL -- -- -- -- --  CKMB -- -- -- -- --  TROPONINI -- -- -- -- --    Estimated Creatinine Clearance: 130.7 ml/min (by C-G formula based on Cr of 0.59).  Assessment:  46 YO homeless male with h/o PE placed on Coumadin in the past but has not taken it x 6 months, patient has an IVC filter in place. Patient presented 5/17 with L ankle pain and swelling. LLE venous duplex at the time negative for DVT (+ for superficial thrombosis).  Patient returns 5/20 with similar complaints and preliminary BLE venous duplex + for bilateral DVT. Lovenox started for VTE treatment. On 5/21 - arterial occlusion noted Lovenox changed to IV heparin.  IR started tenecteplase infusion (2sites) on 5/22 and off on 5/24.   Venogram 5/23 with improved flow in BLE and 5/24 venogram with thrombus largely cleared/lysed.  Received new orders from Radiologist to resume heparin to achieve normal therapeutic goal now off of TNKase.  Heparin was running at 1900 units/hr this AM and heparin level low at 0.16 (heparin goal now 0.3 - 0.7)  H/H low but stable, Plt trending back up.  No further bleeding noted by RN since 5/23  Goal of Therapy:  Heparin level 0.3-0.7 units/ml Monitor platelets by anticoagulation protocol: Yes   Plan:   At 1200  noon today.  Resume heparin at 2100 units/hr with NO bolus.  Pharmacy will f/u with 6 hours heparin level and adjust as needed for therapeutic goals.  Monitor carefully given recent TNKase.  [name] 01/29/2012,11:51 AM  ADDENDUM: Heparin level subtherapeutic (0.2), currently infusing at 2100 units/hr. No bleeding events reported in chart.  New Plan: 1) Increase heparin to 2500 units/hr (25 ml/hr) 2) Recheck heparin level 6 hours after rate increase.  [name], PharmD Pager: [phone] 01/29/2012 7:17 PM

## 2012-01-29 NOTE — Consult Note (Signed)
[facility] CONSULTATION NOTE  Reason for Consult: DVT No Primary MD  ZOX:WRUEA [name] is a 46 y.o. male  homeless man  with a history of  R PE s/p Greenfield filter placement in 06/2010 , then on Coumadin from 07/01/2010 and without Coumadin since December of 2012, as well as a recent diagnosis of superficial thrombosis of a branch of the GSV just proximal to the L ankle and calf (but no DVT) as of 01/22/2012 manifested by 1 week history of  LLE swelling, tenderness and decreased ability to ambulate, along with dyspnea and dry cough when presenting to the ED.  CT angio at the time was negative for PE as well, for which he was d/c home with Ibuprofen 600 mg q 8, and Naproxen 500 mg bid. Symptoms did not improve as of 5/20 , and had fallen due to his severe pain, accompanied by LLE edema, severely tender to touch. New dopplers on 5/20 noted bilateral DVT L>R. He was admitted to the Hospitalist Service for evaluation and management of symptoms. Heparin per pharmacy was initiated. IR  performed a venogram showing  extensive L popliteal, femoral, iliac DVT, and  R femoral  and iliac DVT; caval thrombus to filter   Thrombolysis was done  on 5/23 with improved flow and symptoms. A residual clot and irregularity in the IVC was noted post op below the filter,but thrombolytic therapy is present. stent at the L iliac vein after resolution of thrombus.  Fibrinogen on 5/23 was 278 while on 5/22 was 758.  No prior history of acute bleeding. Currently stabilized, with less leg pain and swelling, no SOB,or fevers.  Hypercoagulable panel 5/21 after initiation of anticoagulation was remarkable for positive Lupus Anticoagulant, negative for FV Leiden mutation, Prot C activity 160 and Total 102,  Normal Prot S activity and total . We were requested to consult on this patient with recommendations regarding his care. He is full code.  PMH: Past Medical History  Diagnosis Date  . Coronary artery disease   .  Degenerative joint disease of spine   . Pulmonary embolism   . 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 Left elbow surgery   . 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                                                                              06/2010   Allergies: No Known Allergies  Medications:  Prior to Admission:  Prescriptions prior to admission  Medication Sig Dispense Refill  . ibuprofen (ADVIL,MOTRIN) 200 MG tablet Take 600 mg by mouth every 8 (eight) hours as needed. For pain.      . naproxen (NAPROSYN) 500 MG tablet Take 1 tablet (500 mg total) by mouth 2 (two) times daily.  30 tablet  0  . traMADol (ULTRAM)  50 MG tablet Take 50 mg by mouth every 8 (eight) hours as needed. pain        ZOX:WRUEAV chloride, HYDROcodone-acetaminophen, iohexol, LORazepam, LORazepam, midazolam, morphine injection, ondansetron (ZOFRAN) IV, ondansetron, sodium chloride  ROS: Constitutional: Positive for unintentional 20 lb weight loss over the last year. Negative for fever, chills and malaise/fatigue. remote TB exposure Eyes: Negative for blurred vision and double vision.  Respiratory: Negative for cough, hemoptysis. No shortness of breath at this time.  Cardiovascular: Negative for chest pain. GI: No nausea, vomiting, diarrhea, constipation. No change in bowel caliber. No  Melena or hematochezia.  GU: No blood in urine. No loss of urinary control. Skin: Negative for itching. No rash. No petechia. No history of easy bruising Neurological: No headaches. No motor or sensory deficits.Chronic low back pain Musculoskeletal: recent LLE swelling   Family History:  Father  died with an aneurysm   and his mother who died in her 18s of old age.  He has several siblings.  He does not know their health status.   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  No children He is homeless. Prior Corporate investment banker, now Stage manager for money. He smokes 3 to 5  packs of cigarettes daily and has since he was 46 years old.  He  drinks heavily throughout the day, about 80-90 oz, plus hard liquor, about 1/5 Vodka a day.  Physical Exam  46 year old wm   in no acute distress A. and O. x3 General well-developed, thin  HEENT: Normocephalic, atraumatic, PERRLA. Oral cavity without thrush or lesions.poor dentition. Neck supple. no thyromegaly, no cervical or supraclavicular adenopathy  Lungs clear bilaterally . No wheezing, rhonchi or rales. No axillary masses. Breasts: not examined. Cardiac regular rate and rhythm normal S1-S2, no murmur , rubs or gallops Abdomen soft nontender , bowel sounds x4. No HSM GU/rectal: deferred. Extremities mild clubbing cyanosis. L 2nd finger amputated.  2 + edema L, trace edema on R. Bruising at venipuncture site,  or petechial rash     Labs:  CBC   Lab 01/29/12 0421 01/28/12 1700 01/28/12 0530 01/27/12 1910 01/27/12 0525 01/25/12 1310 01/22/12 1308  WBC 7.7 9.0 11.8* 11.9* 10.7* -- --  HGB 9.3* 9.5* 9.5* 10.3* 10.3* -- --  HCT 28.7* 28.8* 29.2* 30.9* 31.2* -- --  PLT 151 145* 133* 212 189 -- --  MCV 102.5* 102.1* 101.7* 101.6* 102.0* -- --  MCH 33.2 33.7 33.1 33.9 33.7 -- --  MCHC 32.4 33.0 32.5 33.3 33.0 -- --  RDW 14.4 14.3 14.2 14.2 14.4 -- --  LYMPHSABS -- -- -- -- -- 1.3 1.3  MONOABS -- -- -- -- -- 1.1* 0.8  EOSABS -- -- -- -- -- 0.0 0.1  BASOSABS -- -- -- -- -- 0.0 0.1  BANDABS -- -- -- -- -- -- --       CMP    Lab 01/29/12 0421 01/28/12 0530 01/26/12 0515 01/25/12 1318 01/22/12 1317  NA 137 129* 131* 131* 139  K 3.7 3.4* 3.7 4.0 3.9   CL 103 97 96 99 103  CO2 24 24 23  -- 22  GLUCOSE 92 110* 94 90 87  BUN 4* 7 11 5* 5*  CREATININE 0.59 0.60 0.70 0.90 0.57  CALCIUM 7.8* 7.9* 8.7 -- 8.3*  MG 1.7 -- -- -- --  AST -- -- -- -- --  ALT -- -- -- -- --  ALKPHOS -- -- -- -- --  BILITOT -- -- -- -- --        Component Value Date/Time   BILITOT 0.6 03/02/2011 0337   BILIDIR <0.1 06/26/2010 2050   IBILI NOT CALCULATED 06/26/2010 2050    Hepatitis panel 03/01/11 negative. HIV as of 03/01/2011 negative   Imaging Studies:  Ir Angiogram Follow Up Study  01/28/2012  *RADIOLOGY REPORT*  Clinical history:46 year old with bilateral lower extremity deep vein thrombosis and IVC thrombus.  Started thrombolytic therapy on 01/27/2012.  PROCEDURE(S): BILATERAL LOWER EXTREMITY ANGIOGRAPHY follow-up after thrombolytics  Physician: Rachelle Hora. Henn, MD  Fluoroscopy time: 1.4 minutes  Contrast:  70 ml Omnipaque-300  Procedure:The patient was placed prone on the interventional table. Contrast was injected through the side arm of the bilateral sheaths.  The right infusion catheter was slightly pulled back towards the IVC filter at the end of the procedure.  The patient was returned to intensive care unit and thrombolytic therapy was restarted.  Findings:Left lower extremity:  There is now flow within the left femoral vein, left common femoral vein and left iliac veins.  There is residual nonocclusive thrombus in the lower left iliac vein just above the left common femoral vein.  There is residual narrowing and irregularity of the lower IVC most compatible with thrombus. The IVC is patent above the filter.  Right lower extremity:  There is antegrade flow in the left femoral vein and majority of the femoral thrombus has resolved.  Small amount of residual nonocclusive thrombus in the left iliac venous system.  Complications: None  Impression:The flow within the femoral veins and iliac veins have markedly improved.  Majority of the thrombus has resolved in the  lower extremities but there is residual nonocclusive thrombus bilaterally.  Residual narrowing and irregularity of the IVC is most likely related to thrombus.  Plan:  Plan to continue tenecteplase infusions overnight.  Patient is scheduled to follow up in interventional radiology on 01/29/2012 for follow-up angiography.  Original Report Authenticated By: Richarda Overlie, M.D.     Ir US Guide Vasc Access Right  01/29/2012  *RADIOLOGY REPORT*  Clinical data:  Bilateral lower extremity DVT.  Previous IVC filter placement for pulmonary emboli.  BILATERAL LOWER EXTREMITY VENOGRAPHY INFERIOR VENA CAVOGRAPHY INITIATION OF CATHETER DIRECTED THROMBOLYSIS ULTRASOUND GUIDANCE FOR VENOUS ACCESS X2  Comparison:  CT 01/26/2012  Technique and findings: The procedure, risks (including but not limited to bleeding, infection, organ damage), benefits, and alternatives were explained to the patient.  Questions regarding the procedure were encouraged and answered.  The patient understands and consents to the procedure.The popliteal regions were prepped and draped in usual sterile fashion. Maximal barrier sterile technique was utilized including caps, mask, sterile gowns, sterile gloves, sterile drape, hand hygiene and skin antiseptic.  Intravenous Fentanyl and Versed were administered as conscious sedation during continuous cardiorespiratory monitoring by the radiology RN, with a total moderate sedation time of less than 30 minutes.  Skin was infiltrated locally with 1% lidocaine.  Under real time ultrasound guidance, the left popliteal vein was accessed with a 21- gauge micropuncture needle.  The vein was thrombosed.  There was no blood return.  However, the 0.018 guide wire advanced easily, its position confirmed on fluoroscopy.  The needle was exchanged for a micropuncture dilator.  A small contrast injection confirmed appropriate intraluminal venous placement.  The dilator was exchanged over [facility] wire for a 6-French vascular  sheath, through which a 5-French Kumpe catheter was advanced for left lower extremity venography and  inferior vena cavography.  This demonstrated occlusive thrombus throughout the left femoropopliteal and  iliac venous system and in the infrarenal IVC up to the level of the IVC filter.  The catheter was exchanged over the guide wire for a 90-cm infusion catheter with 50 cm infusion length.  This was positioned spanning the iliofemoral thrombus.  In similar fashion, on the right a posterior tibial vein which was patent was accessed with a 21-gauge micropuncture needle.  The guide wire advanced centrally easily.  The transitional dilator was placed, allowed advancement of a Benson wire centrally.  A 5-French Kumpe catheter was advanced, for right lower extremity venography. This demonstrated patency of the popliteal vein with occlusive thrombus in the mid aspect of the femoral vein and extending centrally through the right iliac venous system.  The Kumpe catheter was exchanged for a 6-French vascular sheath, through which a 90-cm infusion catheter with 40 cm infusion length was placed, with infusion side holes spanning from above the IVC filter to the proximal femoral vein.  Transcatheter protocol tenecteplase infusion was initiated bilaterally.  The sheaths were secured to the skin with O-Prolene sutures and catheters secured to the sheaths with Steri-Strips, then covered with sterile dressing.  The patient transferred to the ICU. The patient tolerated the procedure well.  No immediate complication.  IMPRESSION: 1.  Extensive occlusive left DVT through the femoropopliteal and iliac venous systems. 2.  Extensive occlusive right DVT   from the mid femoral vein through the iliac venous system. 3.  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 4.  Successful initiation of catheter directed bilateral venous thrombolytic infusion.  Plan is for follow-up venography 5/23.  Original Report Authenticated By: Osa Craver, M.D.    Ct Angio Chest W/cm &/or Wo Cm  01/22/2012 *RADIOLOGY REPORT* Clinical Data: Shortness of breath, left leg pain, history of deep venous thrombosis, question pulmonary embolism CT ANGIOGRAPHY CHEST Technique: Multidetector CT imaging of the chest using the standard protocol during bolus administration of intravenous contrast. Multiplanar reconstructed images including MIPs were obtained and reviewed to evaluate the vascular anatomy. Due to inadequate opacification of the pulmonary arterial system on initial exam, the patient was reinjected with contrast and the exam repeated. Contrast: OMNIPAQUE IOHEXOL 300 MG/ML SOLN; 80 ml of Omnipaque 300 Comparison: 03/01/2011 Findings: Aorta normal caliber without aneurysm or dissection. No thoracic adenopathy. Pulmonary arteries suboptimally opacified but grossly patent. No definite evidence of pulmonary embolism. Visualized portion of upper abdomen normal appearance. Minimal linear scarring versus chronic atelectasis lateral right lower lobe, minimally nodular in appearance but unchanged since 03/01/2011. Large calcified granuloma superior segment left lower lobe. Additional scattered chronic lung changes. No definite pulmonary infiltrate, pleural effusion or pneumothorax. Osseous structures unremarkable. IMPRESSION: No definite evidence of pulmonary embolism identified on exam limited by suboptimal pulmonary arterial opacification, despite repeating exam. Few scattered chronic lung changes as above. No definite acute intrathoracic abnormality identified. Original Report Authenticated By: Lollie Marrow, M.D.    Ct Femur Left Wo Contrast  01/26/2012 *RADIOLOGY REPORT* Clinical Data: Left thigh swelling, pain, edema, and erythema. CT OF THE LEFT FEMUR WITHOUT CONTRAST Technique: Contiguous axial CT images were obtained to the left femur and multiplanar reformatting was performed. Comparison: None. Findings: Abnormal wall thickening is  present in the urinary bladder with surrounding fluid in the prevesical space, and mesenteric and presacral edema noted in the lower pelvis. Edema tracks along the left common femoral vasculature. The left external iliac, common femoral,  and superficial femoral vein is significantly denser than the corresponding artery, raising suspicion for possible arterial occlusion. Small left inguinal lymph nodes are present. In addition to the perivascular edema, there is diffuse subcutaneous edema in the left thigh as well as edema tracking along the deep fascial margins, especially anteriorly. A small knee effusion is present. No significant abnormal osseous lesions observed. IMPRESSION: 1. Possible arterial occlusion in the left thigh- correlate with distal pulses and consider Doppler assessment or CT angiogram. 2. Diffuse bladder wall thickening may reflect cystitis. There is surrounding fluid in the prevesical space. There is also pelvic mesenteric edema and presacral edema. 3. Diffuse subcutaneous edema is present in the left thigh, with abnormal edema tracking along the fascial planes, but without drainable abscess observed. There is also perivascular edema along the deep venous structures and adjacent arterial structures. Original Report Authenticated By: Dellia Cloud, M.D.       A/P: 46 y.o. male homeless man  with a history of  R PE s/p Greenfield filter placement in 06/2010 , then on Coumadin from 07/01/2010 and without Coumadin since December of 2012, admitted 5/20 with bilateral DVT L>R. Pt is s/p thrombolysis was done  on 5/23 with improved flow and symptoms, and today a stent to the L iliac vein  Per IR. Patient's Hypercoagulable panel to date shows positive  LAC for  which we were requested to see him with recommendations to prevent further episodes.  Patient has a history of heavy ETOH and tobacco abuse in the setting of homelessness,for which is advisable that smoking and ETOH cessation plans  are initiated, along with Social Work involvement. I have explained to the patient the importance of smoking and ETOH cessation in order to help him from the hematological standpoint. Patient has been receptive to discuss with Social Work/ Case Managers further.    Dr. Gaylyn Rong     is to see the patient following this consult with recommendations regarding diagnosis, treatment options and further workup studies.  Thank you for the referral.  [facility] E 01/29/2012 9:01 AM  ============  Attending's Addendum:  I personally reviewed his medical chart, examined patient, discussed the case with him the following recommendation.  He has positive antiphospholipid antibody; however, this test has high rate of false positive in the setting of active thrombosis.  Regardless of the result of thrombophelia work up, he has indwelling IVC filter and now with extensive bilateral lower extremity DVT's, he needs life long anticoagulations.    I discussed with [name] the benefit of Coumadin to decrease the risk of recurrent thrombosis.  However, unmonitored anticoagulation with Coumadin has high risk of bleeding complication; including potentially fatal complication such as intracranial hemorrhage or GI bleed.  He understood the importance of stopping EtOH completely.   Direct Xa inhibitor such as rivaroxaban is an alternative.  However, with half life of 7-10 hours, missing a dose of rivaroxaban can have more thrombogenesis propensity than missing a dose of Coumadin.   Recommend: - IV Heparin protocol per pharmacy for now. - Start Coumadin tomorrow night 01/30/2012 (unless he has active bleeding). Goal INR 2-3 - Appreciate SW/Case management placement of patient in SNF.  He is interested in rehab.  - Upon discharge, please only give him enough Coumadin for about 10-14 days until he can follow up in the [facility] clinic. - In the future, If he proves to be unreliable with Coumadin monitoring, and he is able  to secure a supply of rivaroxaban, we may  consider switching to rivaroxaban.

## 2012-01-29 NOTE — Progress Notes (Signed)
Patient ID: [name]  male  BJS:[id number]    DOB: 12-05-1965    DOA: 01/25/2012  PCP: No primary provider on file.  Subjective: Patient with  improvement in LLE swelling and pain post thrombolysis. Patient with no further blood oozing around IV site and catheter site per nursing. Patient denies any CP and SOB. Patient states he feels better. No complaints.  Objective: Weight change:   Intake/Output Summary (Last 24 hours) at 01/29/12 0829 Last data filed at 01/29/12 0500  Gross per 24 hour  Intake   7222 ml  Output   4835 ml  Net   2387 ml   Blood pressure 128/71, pulse 75, temperature 99.3 F (37.4 C), temperature source Oral, resp. rate 17, height 6\' 2"  (1.88 m), weight 80.1 kg (176 lb 9.4 oz), SpO2 94.00%.  Physical Exam: General: Alert and awake, oriented x3, not in any acute distress. HEENT: anicteric sclera, pupils reactive to light and accommodation, EOMI CVS: S1-S2 clear, no murmur rubs or gallops Chest: clear to auscultation bilaterally, no wheezing, rales or rhonchi in anterior lung fields. Abdomen: soft nontender, nondistended, normal bowel sounds, no organomegaly Extremities: no cyanosis, clubbing. Left LE with 2 + edema > right LE trace +edema, Decrease in L thigh swelling and pain.   Lab Results: Basic Metabolic Panel:  Lab 01/29/12 6073 01/28/12 0530  NA 137 129*  K 3.7 3.4*  CL 103 97  CO2 24 24  GLUCOSE 92 110*  BUN 4* 7  CREATININE 0.59 0.60  CALCIUM 7.8* 7.9*  MG 1.7 --  PHOS -- --   CBC:  Lab 01/29/12 0421 01/28/12 1700 01/25/12 1310  WBC 7.7 9.0 --  NEUTROABS -- -- 11.0*  HGB 9.3* 9.5* --  HCT 28.7* 28.8* --  MCV 102.5* 102.1* --  PLT 151 145* --   Cardiac Enzymes:  Lab 01/25/12 1316  CKTOTAL --  CKMB --  CKMBINDEX --  TROPONINI <0.30    Studies/Results: Dg Chest 2 View  01/25/2012  *RADIOLOGY REPORT*  Clinical Data: Cough, dyspnea  CHEST - 2 VIEW  Comparison: 01/22/2012 CT of the chest  Findings: Cardiomediastinal silhouette is  stable.  No acute infiltrate or pleural effusion.  No pulmonary edema.  Calcified granuloma left lower lobe is stable.  IMPRESSION: No active disease.  No significant change.  Original Report Authenticated By: Natasha Mead, M.D.   Ct Angio Chest W/cm &/or Wo Cm   IMPRESSION: No definite evidence of pulmonary embolism identified on exam limited by suboptimal pulmonary arterial opacification, despite repeating exam. Few scattered chronic lung changes as above. No definite acute intrathoracic abnormality identified.  Original Report Authenticated By: Lollie Marrow, M.D.   Ct Femur Left Wo Contrast  .  IMPRESSION:  1. Possible arterial occlusion in the left thigh- correlate with distal pulses and consider Doppler assessment or CT angiogram. 2.  Diffuse bladder wall thickening may reflect cystitis.  There is surrounding fluid in the prevesical space.  There is also pelvic mesenteric edema and presacral edema. 3.  Diffuse subcutaneous edema is present in the left thigh, with abnormal edema tracking along the fascial planes, but without drainable abscess observed.  There is also perivascular edema along the deep venous structures and adjacent arterial structures.  Original Report Authenticated By: Dellia Cloud, M.D.    Medications: Scheduled Meds:    . cefTRIAXone (ROCEPHIN)  IV  1 g Intravenous Q24H  . docusate sodium  100 mg Oral BID  . folic acid  1 mg Oral Daily  .  mulitivitamin with minerals  1 tablet Oral Daily  . potassium chloride  40 mEq Oral Once  . sodium chloride  3 mL Intravenous Q12H  . thiamine  100 mg Oral Daily   Or  . thiamine  100 mg Intravenous Daily   Continuous Infusions:    . sodium chloride 125 mL/hr at 01/29/12 0435  . heparin 1,900 Units/hr (01/29/12 0500)  . tenecteplase (TNKase) infusion 0.2 mg/hr (01/28/12 1409)  . tenecteplase (TNKase) infusion 0.4 mg/hr (01/29/12 0500)     Assessment/Plan: Active Problems:  Bilateral lower extremity DVT with  extensive clot, left thigh edema/IVC thrombus - S/P CATHETER DIRECTED TNK lysis 01/27/12 per IR -s/p bilateral venograms 01/28/12 with improved flow in both lower extremity veins with residual non-occlusive thrombus. Flow in IVC but residual clot and irregularity in IVC below filter. -  CT left femur done to rule out any hematoma, compartment syndrome given patient did have falls in the recent past and started on anticoagulation. Stat CT left femur did not show any hematoma or compartment syndrome/fasciitis however raised the question of possible arterial occlusion. - Dr Isidoro Donning discussed in detail with vascular surgery, Dr. Myra Gianotti (on-call), who recommended to obtain stat CT angiogram which was done with prelim results negative. CT of the left high showed possible arterial occlusion in the left thigh. Patient did not have the signs for acute ischemia, Doppler was not available. stat CT angiogram was done which per preliminary report by Dr. Ova Freshwater (radiology ) and Dr. Myra Gianotti (vascular surgery) is negative for any acute arterial occlusion.  -  hypercoagulable panel with positive lupus anticoagulant . Hematology consult for further evaluation and rxcs. Patient with resolution of oozing of blood around IV site and catheter site.  Continue IV heparin and TNK per IR Will defer to IR when to start coumadin May need malignancy work up, CT chest negative. Will need colonscopy as outpatient. IR ff and appreciate input and rxcs. Per IR completion of thrombolysis today and evaluation for stent placement in L iliac vein after resolution of thrombus. Follow fibrinogen.  Cystitis on the CT imaging - Urine culture with multiple bacterial morphotypes. Patient on rocephin D4 . - D/C IV Rocephin after todays dose.  Hypokalemia Repleted  Hyponatremia Likely secondary to volume depletion vs beer potomania Improved with hydration. CT chest negative. Decrease IVF to 75cc/h.  Follow  Anemia Likely secondary to  oozing of blood in setting of heparin and TNK. Oozing resolved.  Anemia panel pending. H/H stable at 9.3. Follow H/H. Transfusion threshhold Hgb <8.   Hx ETOH abuse Stable. No s/sx of withdrawal. On CIWA protocol.  Prophylaxis PPI for GI, on heparin and tnk for DVT.     Code Status: Full code  Disposition: Not medically ready   LOS: 4 days   [facility] M.D. [facility] 01/29/2012, 8:29 AM Pager: [phone]

## 2012-01-29 NOTE — Procedures (Signed)
Procedure:  Follow-up venography of both lower extremities on thrombolytic therapy, left iliac vein angioplasty Findings:  Thrombus now largely cleared/lysed.  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  Treated with 10 mm and 12 mm balloon angioplasty.  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  Stenting not performed as it would have been difficult not to affect right sided inflow. A/P:  D/C thrombolytic Rx.  Both catheters and sheaths removed.  Continue IV heparin and will ask Pharmacy to try to achieve therapeutic levels.  Will need some type of chronic anticoagulation given chronic residual iliac vein and IVC abnormalities.  Will d/w [facility] staff.  Chronic compression stockings will likely help with any chronic edema.

## 2012-01-30 DIAGNOSIS — I749 Embolism and thrombosis of unspecified artery: Secondary | ICD-10-CM

## 2012-01-30 DIAGNOSIS — M79609 Pain in unspecified limb: Secondary | ICD-10-CM

## 2012-01-30 DIAGNOSIS — E871 Hypo-osmolality and hyponatremia: Secondary | ICD-10-CM

## 2012-01-30 DIAGNOSIS — D649 Anemia, unspecified: Secondary | ICD-10-CM

## 2012-01-30 LAB — BASIC METABOLIC PANEL
Calcium: 7.7 mg/dL — ABNORMAL LOW (ref 8.4–10.5)
GFR calc Af Amer: >90 mL/min (ref >90)
GFR calc non Af Amer: >90 mL/min (ref >90)
Potassium: 3.7 mEq/L (ref 3.5–5.1)
Sodium: 133 mEq/L — ABNORMAL LOW (ref 135–145)

## 2012-01-30 LAB — CBC
Hemoglobin: 9.3 g/dL — ABNORMAL LOW (ref 13.0–17.0)
MCH: 33.8 pg (ref 26.0–34.0)
MCHC: 33.1 g/dL (ref 30.0–36.0)
Platelets: 209 10*3/uL (ref 150–400)
RDW: 14.5 % (ref 11.5–15.5)

## 2012-01-30 LAB — FIBRINOGEN: Fibrinogen: 401 mg/dL (ref 204–475)

## 2012-01-30 LAB — MAGNESIUM: Magnesium: 2 mg/dL (ref 1.5–2.5)

## 2012-01-30 LAB — HEPARIN LEVEL (UNFRACTIONATED)
Heparin Unfractionated: 0.34 IU/mL (ref 0.30–0.70)
Heparin Unfractionated: 0.37 IU/mL (ref 0.30–0.70)

## 2012-01-30 LAB — PROTIME-INR
INR: 0.86 (ref 0.00–1.49)
Prothrombin Time: 11.9 seconds (ref 11.6–15.2)

## 2012-01-30 MED ORDER — COUMADIN BOOK
1.0000 | Freq: Once | Status: AC
  Administered 2012-01-30: 1
  Filled 2012-01-30: qty 1

## 2012-01-30 MED ORDER — WARFARIN - PHARMACIST DOSING INPATIENT
Freq: Every day | Status: DC
  Administered 2012-01-30 – 2012-02-03 (×3)

## 2012-01-30 MED ORDER — WARFARIN SODIUM 7.5 MG PO TABS
7.5000 mg | ORAL_TABLET | Freq: Once | ORAL | Status: AC
  Administered 2012-01-30: 7.5 mg via ORAL
  Filled 2012-01-30: qty 1

## 2012-01-30 MED ORDER — WARFARIN VIDEO: Freq: Once | Status: DC

## 2012-01-30 NOTE — Progress Notes (Signed)
Patient ID: [name]  male  WUJ:[id number]    DOB: 06-15-1966    DOA: 01/25/2012  PCP: No primary provider on file.  Subjective: Patient with  improvement in LLE swelling and pain post thrombolysis. Patient with no further blood oozing around IV site and catheter site per nursing. Patient denies any CP and SOB. Patient states compression stockings making his legs hurt. No complaints.  Objective: Weight change:   Intake/Output Summary (Last 24 hours) at 01/30/12 1134 Last data filed at 01/30/12 0700  Gross per 24 hour  Intake 1960.33 ml  Output   2250 ml  Net -289.67 ml   Blood pressure 124/70, pulse 88, temperature 98.4 F (36.9 C), temperature source Oral, resp. rate 17, height 6\' 2"  (1.88 m), weight 80.1 kg (176 lb 9.4 oz), SpO2 97.00%.  Physical Exam: General: Alert and awake, oriented x3, not in any acute distress. HEENT: anicteric sclera, pupils reactive to light and accommodation, EOMI CVS: S1-S2 clear, no murmur rubs or gallops Chest: clear to auscultation bilaterally, no wheezing, rales or rhonchi in anterior lung fields. Abdomen: soft nontender, nondistended, normal bowel sounds, no organomegaly Extremities: no cyanosis, clubbing. Left LE with 2 + edema > right LE trace +edema, Decrease in L thigh swelling and pain.   Lab Results: Basic Metabolic Panel:  Lab 01/30/12 9562 01/29/12 0421  NA 133* 137  K 3.7 3.7  CL 101 103  CO2 24 24  GLUCOSE 101* 92  BUN 5* 4*  CREATININE 0.62 0.59  CALCIUM 7.7* 7.8*  MG 2.0 --  PHOS -- --   CBC:  Lab 01/30/12 0219 01/29/12 0421 01/25/12 1310  WBC 8.3 7.7 --  NEUTROABS -- -- 11.0*  HGB 9.3* 9.3* --  HCT 28.1* 28.7* --  MCV 102.2* 102.5* --  PLT 209 151 --   Cardiac Enzymes:  Lab 01/25/12 1316  CKTOTAL --  CKMB --  CKMBINDEX --  TROPONINI <0.30    Studies/Results: Dg Chest 2 View  01/25/2012  *RADIOLOGY REPORT*  Clinical Data: Cough, dyspnea  CHEST - 2 VIEW  Comparison: 01/22/2012 CT of the chest  Findings:  Cardiomediastinal silhouette is stable.  No acute infiltrate or pleural effusion.  No pulmonary edema.  Calcified granuloma left lower lobe is stable.  IMPRESSION: No active disease.  No significant change.  Original Report Authenticated By: Natasha Mead, M.D.   Ct Angio Chest W/cm &/or Wo Cm   IMPRESSION: No definite evidence of pulmonary embolism identified on exam limited by suboptimal pulmonary arterial opacification, despite repeating exam. Few scattered chronic lung changes as above. No definite acute intrathoracic abnormality identified.  Original Report Authenticated By: Lollie Marrow, M.D.   Ct Femur Left Wo Contrast  .  IMPRESSION:  1. Possible arterial occlusion in the left thigh- correlate with distal pulses and consider Doppler assessment or CT angiogram. 2.  Diffuse bladder wall thickening may reflect cystitis.  There is surrounding fluid in the prevesical space.  There is also pelvic mesenteric edema and presacral edema. 3.  Diffuse subcutaneous edema is present in the left thigh, with abnormal edema tracking along the fascial planes, but without drainable abscess observed.  There is also perivascular edema along the deep venous structures and adjacent arterial structures.  Original Report Authenticated By: Dellia Cloud, M.D.    Medications: Scheduled Meds:    . cefTRIAXone (ROCEPHIN)  IV  1 g Intravenous Q24H  . docusate sodium  100 mg Oral BID  . folic acid  1 mg Oral Daily  .  magnesium sulfate 1 - 4 g bolus IVPB  2 g Intravenous Once  . mulitivitamin with minerals  1 tablet Oral Daily  . pantoprazole  40 mg Oral Q1200  . sodium chloride  3 mL Intravenous Q12H  . thiamine  100 mg Oral Daily   Or  . thiamine  100 mg Intravenous Daily   Continuous Infusions:    . heparin 2,500 Units/hr (01/30/12 0238)  . DISCONTD: sodium chloride 75 mL/hr at 01/30/12 0744  . DISCONTD: heparin 1,900 Units/hr (01/29/12 0500)     Assessment/Plan: Active Problems:  Bilateral  lower extremity DVT with extensive clot, left thigh edema/IVC thrombus - S/P CATHETER DIRECTED TNK lysis 01/27/12 per IR -s/p bilateral venograms 01/28/12 with improved flow in both lower extremity veins with residual non-occlusive thrombus. Flow in IVC but residual clot and irregularity in IVC below filter. -  CT left femur done to rule out any hematoma, compartment syndrome given patient did have falls in the recent past and started on anticoagulation. Stat CT left femur did not show any hematoma or compartment syndrome/fasciitis however raised the question of possible arterial occlusion. - Dr Isidoro Donning discussed in detail with vascular surgery, Dr. Myra Gianotti (on-call), who recommended to obtain stat CT angiogram which was done with prelim results negative. CT of the left high showed possible arterial occlusion in the left thigh. Patient did not have the signs for acute ischemia, Doppler was not available. stat CT angiogram was done which per preliminary report by Dr. Ova Freshwater (radiology ) and Dr. Myra Gianotti (vascular surgery) is negative for any acute arterial occlusion.  -  hypercoagulable panel with positive lupus anticoagulant . Patient with resolution of oozing of blood around IV site and catheter site.  Continue IV heparin and TNK per IR Will resume coumadin today May need malignancy work up, CT chest negative. Will need colonscopy as outpatient. IR ff and appreciate input and rxcs. Per IR completion of thrombolysis yesterday. S/p angioplasty of L iliac vein. Compression stockings. Appreciate hematology input and rxcs.  Cystitis on the CT imaging - Urine culture with multiple bacterial morphotypes. Patient s/p rocephin D4 . -  IV Rocephin d/c'd yesterday.  Hypokalemia Repleted.  Hyponatremia Likely secondary to volume depletion vs beer potomania Improved with hydration. CT chest negative. NSL IVF.  Follow  Anemia Likely secondary to oozing of blood in setting of heparin and TNK. Oozing  resolved.  Anemia panel c/w iron deficiency. H/H stable at 9.3. Follow H/H. Transfusion threshhold Hgb <8.   Hx ETOH abuse Stable. No s/sx of withdrawal. On CIWA protocol.  Prophylaxis PPI for GI, on heparin and tnk for DVT.    Code Status: Full code  Disposition: Not medically ready   LOS: 5 days   [facility] M.D. [facility] 01/30/2012, 11:34 AM Pager: [phone]

## 2012-01-30 NOTE — Progress Notes (Signed)
ANTICOAGULATION CONSULT NOTE - Initial Consult  Pharmacy Consult for warfarin Indication: DVT  No Known Allergies  Patient Measurements: Height: 6\' 2"  (188 cm) Weight: 176 lb 9.4 oz (80.1 kg) IBW/kg (Calculated) : 82.2  Heparin Dosing Weight:   Vital Signs: Temp: 97.6 F (36.4 C) (05/25 1240) Temp src: Oral (05/25 1240) BP: 131/82 mmHg (05/25 1240) Pulse Rate: 76  (05/25 1240)  Labs:  Alvira Philips 01/30/12 1219 01/30/12 0833 01/30/12 0219 01/29/12 1830 01/29/12 0421 01/28/12 1700 01/28/12 0530  HGB -- -- 9.3* -- 9.3* -- --  HCT -- -- 28.1* -- 28.7* 28.8* --  PLT -- -- 209 -- 151 145* --  APTT -- -- -- -- -- -- --  LABPROT 11.9 -- -- -- -- -- --  INR 0.86 -- -- -- -- -- --  HEPARINUNFRC -- 0.37 0.34 0.20* -- -- --  CREATININE -- -- 0.62 -- 0.59 -- 0.60  CKTOTAL -- -- -- -- -- -- --  CKMB -- -- -- -- -- -- --  TROPONINI -- -- -- -- -- -- --    Estimated Creatinine Clearance: 130.7 ml/min (by C-G formula based on Cr of 0.62).   Medical History: Past Medical History  Diagnosis Date  . Coronary artery disease   . Degenerative joint disease of spine   . Pulmonary embolism 06/2010  . Degenerative joint disease     Medications:  Scheduled:    . coumadin book  1 each Does not apply Once  . docusate sodium  100 mg Oral BID  . folic acid  1 mg Oral Daily  . magnesium sulfate 1 - 4 g bolus IVPB  2 g Intravenous Once  . mulitivitamin with minerals  1 tablet Oral Daily  . pantoprazole  40 mg Oral Q1200  . sodium chloride  3 mL Intravenous Q12H  . thiamine  100 mg Oral Daily   Or  . thiamine  100 mg Intravenous Daily  . warfarin  7.5 mg Oral ONCE-1800  . warfarin   Does not apply Once  . Warfarin - Pharmacist Dosing Inpatient   Does not apply q1800    Assessment: Patient with DVT, MD now wishes pharmacy to dose warfarin.  INR baseline WNL.  Goal of Therapy:  INR 2-3    Plan:  Warfarin 7.5mg  po x1,  Daily PT/INR, Coumadin Book/video    Darlina Guys, Jacquenette Shone  [place] 01/30/2012,1:58 PM

## 2012-01-30 NOTE — Progress Notes (Signed)
ANTICOAGULATION CONSULT NOTE - Follow Up Consult  Pharmacy Consult for IV heparin Indication: Bilateral DVT  No Known Allergies  Patient Measurements: Height: 6\' 2"  (188 cm) Weight: 176 lb 9.4 oz (80.1 kg) IBW/kg (Calculated) : 82.2   Vital Signs: Temp: 98.4 F (36.9 C) (05/25 0512) Temp src: Oral (05/25 0512) BP: 124/70 mmHg (05/25 0512) Pulse Rate: 88  (05/25 0755)  Labs:  Alvira Philips 01/30/12 0833 01/30/12 0219 01/29/12 1830 01/29/12 0421 01/28/12 1700 01/28/12 0530  HGB -- 9.3* -- 9.3* -- --  HCT -- 28.1* -- 28.7* 28.8* --  PLT -- 209 -- 151 145* --  APTT -- -- -- -- -- --  LABPROT -- -- -- -- -- --  INR -- -- -- -- -- --  HEPARINUNFRC 0.37 0.34 0.20* -- -- --  CREATININE -- 0.62 -- 0.59 -- 0.60  CKTOTAL -- -- -- -- -- --  CKMB -- -- -- -- -- --  TROPONINI -- -- -- -- -- --    Estimated Creatinine Clearance: 130.7 ml/min (by C-G formula based on Cr of 0.62).  Assessment:  46 YO homeless male with h/o PE placed on Coumadin in the past but has not taken it x 6 months, patient has an IVC filter in place. Patient presented 5/17 with L ankle pain and swelling. LLE venous duplex at the time negative for DVT (+ for superficial thrombosis).  Patient returns 5/20 with similar complaints and preliminary BLE venous duplex + for bilateral DVT. Lovenox started for VTE treatment. On 5/21 - arterial occlusion noted Lovenox changed to IV heparin.  IR started tenecteplase infusion (2sites) on 5/22 and off on 5/24.   Venogram 5/23 with improved flow in BLE and 5/24 venogram with thrombus largely cleared/lysed.  Received new orders from Radiologist to resume heparin to achieve normal therapeutic goal now off of TNKase.  H/H low but stable, Plt trending back up (PLTC = 209 5/25).  No further bleeding noted by RN since 5/23  Heparin level therapeutic x 2 now on 2500 units/hr since d/c of TNKase  Goal of Therapy:  Heparin level 0.3-0.7 units/ml Monitor platelets by anticoagulation  protocol: Yes   Plan:   Continue IV heparin @ 2500 units/hr  Monitor carefully given recent TNKase.  Monitor daily CBC and heparin level   Hessie Knows, PharmD, BCPS Pager [phone] 01/30/2012 9:19 AM

## 2012-01-30 NOTE — Progress Notes (Signed)
ANTICOAGULATION CONSULT NOTE - Follow Up Consult  Pharmacy Consult for IV heparin Indication: Bilateral DVT  No Known Allergies  Patient Measurements: Height: 6\' 2"  (188 cm) Weight: 176 lb 9.4 oz (80.1 kg) IBW/kg (Calculated) : 82.2   Vital Signs: Temp: 98.2 F (36.8 C) (05/24 2200) Temp src: Oral (05/24 2200) BP: 111/70 mmHg (05/24 2200) Pulse Rate: 80  (05/24 2200)  Labs:  Basename 01/30/12 0219 01/29/12 1830 01/29/12 0421 01/28/12 1700 01/28/12 0530  HGB 9.3* -- 9.3* -- --  HCT 28.1* -- 28.7* 28.8* --  PLT 209 -- 151 145* --  APTT -- -- -- -- --  LABPROT -- -- -- -- --  INR -- -- -- -- --  HEPARINUNFRC 0.34 0.20* 0.16* -- --  CREATININE 0.62 -- 0.59 -- 0.60  CKTOTAL -- -- -- -- --  CKMB -- -- -- -- --  TROPONINI -- -- -- -- --    Estimated Creatinine Clearance: 130.7 ml/min (by C-G formula based on Cr of 0.62).  Assessment:  46 YO homeless male with h/o PE placed on Coumadin in the past but has not taken it x 6 months, patient has an IVC filter in place. Patient presented 5/17 with L ankle pain and swelling. LLE venous duplex at the time negative for DVT (+ for superficial thrombosis).  Patient returns 5/20 with similar complaints and preliminary BLE venous duplex + for bilateral DVT. Lovenox started for VTE treatment. On 5/21 - arterial occlusion noted Lovenox changed to IV heparin.  IR started tenecteplase infusion (2sites) on 5/22 and off on 5/24.   Venogram 5/23 with improved flow in BLE and 5/24 venogram with thrombus largely cleared/lysed.  Received new orders from Radiologist to resume heparin to achieve normal therapeutic goal now off of TNKase.  H/H low but stable, Plt trending back up (PLTC = 209 5/25).  No further bleeding noted by RN since 5/23  Heparin level therapeutic (0.34) with rate of 2500 units/hr  Goal of Therapy:  Heparin level 0.3-0.7 units/ml Monitor platelets by anticoagulation protocol: Yes   Plan:   Continue IV heparin @ 2500  units/hr  Pharmacy will repeat heparin level in 6 hrs to confirm therapeutic dose  Monitor carefully given recent TNKase.  Monitor daily CBC  Janean Eischen Trefz 01/30/2012,2:59 AM

## 2012-01-31 DIAGNOSIS — D649 Anemia, unspecified: Secondary | ICD-10-CM

## 2012-01-31 DIAGNOSIS — E871 Hypo-osmolality and hyponatremia: Secondary | ICD-10-CM

## 2012-01-31 DIAGNOSIS — I749 Embolism and thrombosis of unspecified artery: Secondary | ICD-10-CM

## 2012-01-31 DIAGNOSIS — M79609 Pain in unspecified limb: Secondary | ICD-10-CM

## 2012-01-31 LAB — PROTIME-INR: Prothrombin Time: 12.4 seconds (ref 11.6–15.2)

## 2012-01-31 LAB — CBC
MCH: 34.1 pg — ABNORMAL HIGH (ref 26.0–34.0)
MCHC: 33 g/dL (ref 30.0–36.0)
Platelets: 309 10*3/uL (ref 150–400)
RDW: 14.7 % (ref 11.5–15.5)

## 2012-01-31 LAB — HEPARIN LEVEL (UNFRACTIONATED): Heparin Unfractionated: 0.53 IU/mL (ref 0.30–0.70)

## 2012-01-31 LAB — BASIC METABOLIC PANEL
BUN: 5 mg/dL — ABNORMAL LOW (ref 6–23)
Creatinine, Ser: 0.74 mg/dL (ref 0.50–1.35)
GFR calc Af Amer: >90 mL/min (ref >90)
GFR calc non Af Amer: >90 mL/min (ref >90)
Glucose, Bld: 81 mg/dL (ref 70–99)

## 2012-01-31 MED ORDER — WARFARIN SODIUM 7.5 MG PO TABS
7.5000 mg | ORAL_TABLET | Freq: Once | ORAL | Status: AC
  Administered 2012-01-31: 7.5 mg via ORAL
  Filled 2012-01-31: qty 1

## 2012-01-31 MED ORDER — LORAZEPAM 1 MG PO TABS
1.0000 mg | ORAL_TABLET | Freq: Three times a day (TID) | ORAL | Status: DC | PRN
  Administered 2012-01-31 – 2012-02-05 (×6): 1 mg via ORAL
  Filled 2012-01-31 (×7): qty 1

## 2012-01-31 NOTE — Progress Notes (Signed)
Patient ID: [name]  male  XBJ:[id number]    DOB: 05/24/1966    DOA: 01/25/2012  PCP: No primary provider on file.  Subjective: Patient with  improvement in LLE swelling and pain post thrombolysis. Patient with no further blood oozing around IV site and catheter site per nursing. Patient denies any CP and SOB. Patient states developed bruise on right flank. Patient states still with swelling in LLE. No complaints.  Objective: Weight change:   Intake/Output Summary (Last 24 hours) at 01/31/12 1612 Last data filed at 01/31/12 1022  Gross per 24 hour  Intake  558.8 ml  Output   1950 ml  Net -1391.2 ml   Blood pressure 129/74, pulse 81, temperature 98.3 F (36.8 C), temperature source Oral, resp. rate 18, height 6\' 2"  (1.88 m), weight 80.1 kg (176 lb 9.4 oz), SpO2 94.00%.  Physical Exam: General: Alert and awake, oriented x3, not in any acute distress. HEENT: anicteric sclera, pupils reactive to light and accommodation, EOMI CVS: S1-S2 clear, no murmur rubs or gallops Chest: clear to auscultation bilaterally, no wheezing, rales or rhonchi in anterior lung fields. Abdomen: soft nontender, nondistended, normal bowel sounds, no organomegaly. Bruise on R flank. Extremities: no cyanosis, clubbing. Left LE with 2 + edema > right LE trace +edema, Decrease in L thigh swelling and pain.   Lab Results: Basic Metabolic Panel:  Lab 01/31/12 3086 01/30/12 0219  NA 139 133*  K 4.0 3.7  CL 106 101  CO2 28 24  GLUCOSE 81 101*  BUN 5* 5*  CREATININE 0.74 0.62  CALCIUM 8.0* 7.7*  MG -- 2.0  PHOS -- --   CBC:  Lab 01/31/12 0456 01/30/12 0219 01/25/12 1310  WBC 8.2 8.3 --  NEUTROABS -- -- 11.0*  HGB 9.4* 9.3* --  HCT 28.5* 28.1* --  MCV 103.3* 102.2* --  PLT 309 209 --   Cardiac Enzymes:  Lab 01/25/12 1316  CKTOTAL --  CKMB --  CKMBINDEX --  TROPONINI <0.30    Studies/Results: Dg Chest 2 View  01/25/2012  *RADIOLOGY REPORT*  Clinical Data: Cough, dyspnea  CHEST - 2 VIEW   Comparison: 01/22/2012 CT of the chest  Findings: Cardiomediastinal silhouette is stable.  No acute infiltrate or pleural effusion.  No pulmonary edema.  Calcified granuloma left lower lobe is stable.  IMPRESSION: No active disease.  No significant change.  Original Report Authenticated By: Natasha Mead, M.D.   Ct Angio Chest W/cm &/or Wo Cm   IMPRESSION: No definite evidence of pulmonary embolism identified on exam limited by suboptimal pulmonary arterial opacification, despite repeating exam. Few scattered chronic lung changes as above. No definite acute intrathoracic abnormality identified.  Original Report Authenticated By: Lollie Marrow, M.D.   Ct Femur Left Wo Contrast  .  IMPRESSION:  1. Possible arterial occlusion in the left thigh- correlate with distal pulses and consider Doppler assessment or CT angiogram. 2.  Diffuse bladder wall thickening may reflect cystitis.  There is surrounding fluid in the prevesical space.  There is also pelvic mesenteric edema and presacral edema. 3.  Diffuse subcutaneous edema is present in the left thigh, with abnormal edema tracking along the fascial planes, but without drainable abscess observed.  There is also perivascular edema along the deep venous structures and adjacent arterial structures.  Original Report Authenticated By: Dellia Cloud, M.D.    Medications: Scheduled Meds:    . docusate sodium  100 mg Oral BID  . folic acid  1 mg Oral Daily  .  mulitivitamin with minerals  1 tablet Oral Daily  . pantoprazole  40 mg Oral Q1200  . sodium chloride  3 mL Intravenous Q12H  . thiamine  100 mg Oral Daily   Or  . thiamine  100 mg Intravenous Daily  . warfarin  7.5 mg Oral ONCE-1800  . warfarin  7.5 mg Oral ONCE-1800  . warfarin   Does not apply Once  . Warfarin - Pharmacist Dosing Inpatient   Does not apply q1800   Continuous Infusions:    . heparin 2,500 Units/hr (01/31/12 1121)     Assessment/Plan: Active Problems:  Bilateral lower  extremity DVT with extensive clot, left thigh edema/IVC thrombus - S/P CATHETER DIRECTED TNK lysis 01/27/12 per IR -s/p bilateral venograms 01/28/12 with improved flow in both lower extremity veins with residual non-occlusive thrombus. Flow in IVC but residual clot and irregularity in IVC below filter. -  CT left femur done to rule out any hematoma, compartment syndrome given patient did have falls in the recent past and started on anticoagulation. Stat CT left femur did not show any hematoma or compartment syndrome/fasciitis however raised the question of possible arterial occlusion. - Dr Isidoro Donning discussed in detail with vascular surgery, Dr. Myra Gianotti (on-call), who recommended to obtain stat CT angiogram which was done with prelim results negative. CT of the left high showed possible arterial occlusion in the left thigh. Patient did not have the signs for acute ischemia, Doppler was not available. stat CT angiogram was done which per preliminary report by Dr. Ova Freshwater (radiology ) and Dr. Myra Gianotti (vascular surgery) is negative for any acute arterial occlusion.  -  hypercoagulable panel with positive lupus anticoagulant . Patient with resolution of oozing of blood around IV site and catheter site.  Continue IV heparin and coumadin May need malignancy work up, CT chest negative. Will need colonscopy as outpatient. IR ff and appreciate input and rxcs. Per IR completion of thrombolysis. S/p angioplasty of L iliac vein. Compression stockings. Appreciate hematology input and rxcs.  Cystitis on the CT imaging - Urine culture with multiple bacterial morphotypes. Patient s/p rocephin D4 . -  IV Rocephin d/c'd  Hypokalemia Repleted.  Hyponatremia Likely secondary to volume depletion vs beer potomania Improved with hydration. CT chest negative. NSL IVF.  Follow  Anemia Likely secondary to oozing of blood in setting of heparin and TNK. Oozing resolved.  Anemia panel c/w iron deficiency. H/H stable at  9.3. Follow H/H. Transfusion threshhold Hgb <8.   Hx ETOH abuse Stable. No s/sx of withdrawal. On CIWA protocol.  Prophylaxis PPI for GI, on heparin and tnk for DVT.    Code Status: Full code  Disposition: Not medically ready   LOS: 6 days   [facility] M.D. [facility] 01/31/2012, 4:12 PM Pager: [phone]

## 2012-01-31 NOTE — Progress Notes (Signed)
ANTICOAGULATION CONSULT NOTE - Follow Up Consult  Pharmacy Consult for IV heparin/warfarin Indication: Bilateral DVT  No Known Allergies  Patient Measurements: Height: 6\' 2"  (188 cm) Weight: 176 lb 9.4 oz (80.1 kg) IBW/kg (Calculated) : 82.2   Vital Signs: Temp: 98.3 F (36.8 C) (05/26 0551) Temp src: Oral (05/26 0551) BP: 129/74 mmHg (05/26 0551) Pulse Rate: 81  (05/26 0551)  Labs:  Basename 01/31/12 0456 01/30/12 1219 01/30/12 0833 01/30/12 0219 01/29/12 0421  HGB 9.4* -- -- 9.3* --  HCT 28.5* -- -- 28.1* 28.7*  PLT 309 -- -- 209 151  APTT -- -- -- -- --  LABPROT 12.4 11.9 -- -- --  INR 0.91 0.86 -- -- --  HEPARINUNFRC 0.53 -- 0.37 0.34 --  CREATININE 0.74 -- -- 0.62 0.59  CKTOTAL -- -- -- -- --  CKMB -- -- -- -- --  TROPONINI -- -- -- -- --    Estimated Creatinine Clearance: 130.7 ml/min (by C-G formula based on Cr of 0.74).  Assessment:  46 YO homeless male with h/o PE placed on Coumadin in the past but has not taken it x 6 months, patient has an IVC filter in place. Patient presented 5/17 with L ankle pain and swelling. LLE venous duplex at the time negative for DVT (+ for superficial thrombosis).  Patient returns 5/20 with similar complaints and preliminary BLE venous duplex + for bilateral DVT. Lovenox started for VTE treatment. On 5/21 - arterial occlusion noted Lovenox changed to IV heparin.  IR started tenecteplase infusion (2sites) on 5/22 and off on 5/24.   Venogram 5/23 with improved flow in BLE and 5/24 venogram with thrombus largely cleared/lysed.  Received new orders from Radiologist to resume heparin to achieve normal therapeutic goal now off of TNKase.  CBC stable  No reported bleeding  Heparin level therapeutic  INR subtherapeutic as expected after first dose of warfarin 7.5mg  yesterday PM  Goal of Therapy:  INR 2-3 Heparin level 0.3-0.7 units/ml Monitor platelets by anticoagulation protocol: Yes   Plan:   Continue IV heparin @ 2500  units/hr  Repeat 7.5mg  warfarin today  Monitor daily CBC, INR and heparin level  Today will be Day 2/5 of heparin/warfarin overlap   Hessie Knows, PharmD, BCPS Pager [phone] 01/31/2012 7:14 AM

## 2012-01-31 NOTE — Plan of Care (Signed)
Problem: Phase II Progression Outcomes Goal: Other Phase II Outcomes/Goals Outcome: Progressing Sit up in chair for  Longer periods of time. Progress to ambulating as tolerated

## 2012-01-31 NOTE — Progress Notes (Signed)
Pt. Alert and oriented, appetite good for breakfast. Teds on. Lt LE appears more edematous than the Rt. Both elevated on 3 pillows. Pt sat up in chair about 30 minutes then went back to bed because his legs were hurting. Pt. Medicated x 2 for pain in the back of his legs, rates it a 7 and comes down to a 4. He was watching TV in bed with no further complaints.

## 2012-02-01 DIAGNOSIS — I749 Embolism and thrombosis of unspecified artery: Secondary | ICD-10-CM

## 2012-02-01 DIAGNOSIS — E871 Hypo-osmolality and hyponatremia: Secondary | ICD-10-CM

## 2012-02-01 DIAGNOSIS — D649 Anemia, unspecified: Secondary | ICD-10-CM

## 2012-02-01 DIAGNOSIS — M79609 Pain in unspecified limb: Secondary | ICD-10-CM

## 2012-02-01 LAB — BASIC METABOLIC PANEL
BUN: 5 mg/dL — ABNORMAL LOW (ref 6–23)
Chloride: 103 mEq/L (ref 96–112)
Creatinine, Ser: 0.72 mg/dL (ref 0.50–1.35)
GFR calc Af Amer: >90 mL/min (ref >90)
Glucose, Bld: 98 mg/dL (ref 70–99)
Potassium: 3.6 mEq/L (ref 3.5–5.1)

## 2012-02-01 LAB — PROTIME-INR: Prothrombin Time: 12 seconds (ref 11.6–15.2)

## 2012-02-01 LAB — CBC
MCH: 32.9 pg (ref 26.0–34.0)
MCHC: 31.7 g/dL (ref 30.0–36.0)
RDW: 14.7 % (ref 11.5–15.5)

## 2012-02-01 LAB — HEPARIN LEVEL (UNFRACTIONATED): Heparin Unfractionated: 0.43 IU/mL (ref 0.30–0.70)

## 2012-02-01 MED ORDER — WARFARIN SODIUM 7.5 MG PO TABS
15.0000 mg | ORAL_TABLET | Freq: Once | ORAL | Status: AC
  Administered 2012-02-01: 15 mg via ORAL
  Filled 2012-02-01: qty 2

## 2012-02-01 NOTE — Evaluation (Signed)
Physical Therapy Evaluation Patient Details Name: [name] MRN: [id number] DOB: 26-Feb-1966 Today's Date: 02/01/2012 Time: 1335-1400 PT Time Calculation (min): 25 min  PT Assessment / Plan / Recommendation Clinical Impression  46 yo male admitted with bil DVT lower extremities. Experiencing increased pain and decreased activity tolerance on evaluation. Pt is homeless. Do not feel pt is safe enough/mobilizing well enough to d/c to campground environment at this time. Feel ST rehab at SNF will be beneficial in  alllowing pt to regain independence and PLOF.     PT Assessment  Patient needs continued PT services    Follow Up Recommendations  Skilled nursing facility    Barriers to Discharge        lEquipment Recommendations  Defer to next venue    Recommendations for Other Services     Frequency Min 3X/week    Precautions / Restrictions Precautions Precautions: Fall Restrictions Weight Bearing Restrictions: No   Pertinent Vitals/Pain       Mobility  Bed Mobility Bed Mobility: Supine to Sit Supine to Sit: 6: Modified independent (Device/Increase time) Transfers Transfers: Sit to Stand;Stand to Sit Sit to Stand: 5: Supervision;With upper extremity assist;From bed Stand to Sit: 5: Supervision;With upper extremity assist;To chair/3-in-1 Details for Transfer Assistance: VCs safety. Required use of bilateral UEs for support/assist to rise and control descent. Ambulation/Gait Ambulation/Gait Assistance: 4: Min guard Ambulation Distance (Feet): 75 Feet Assistive device: Rolling walker Ambulation/Gait Assistance Details: vCs safety, technique, distance from RW. Fatigues easily and increased pain with distance. Increased reliance on UEs.  Gait Pattern: Step-to pattern;Antalgic;Trunk flexed    Exercises     PT Diagnosis: Difficulty walking;Abnormality of gait;Generalized weakness;Acute pain  PT Problem List: Decreased strength;Decreased range of motion;Decreased activity  tolerance;Decreased mobility;Decreased knowledge of use of DME;Pain PT Treatment Interventions: DME instruction;Gait training;Functional mobility training;Therapeutic activities;Therapeutic exercise;Patient/family education   PT Goals Acute Rehab PT Goals PT Goal Formulation: With patient Time For Goal Achievement: 02/15/12 Potential to Achieve Goals: Good Pt will go Sit to Stand: with modified independence PT Goal: Sit to Stand - Progress: Goal set today Pt will go Stand to Sit: with modified independence PT Goal: Stand to Sit - Progress: Goal set today Pt will Ambulate: >150 feet;with modified independence;with least restrictive assistive device PT Goal: Ambulate - Progress: Goal set today  Visit Information  Last PT Received On: 02/01/12 Assistance Needed: +1    Subjective Data  Subjective: "See how swollen that leg is?" Patient Stated Goal: Less pain. Mobilizing better.    Prior Functioning  Home Living Lives With:  (homeless) Type of Home:  (campgrounds) Home Adaptive Equipment: None Prior Function Level of Independence: Independent Driving: No Communication Communication: No difficulties    Cognition  Overall Cognitive Status: Appears within functional limits for tasks assessed/performed Arousal/Alertness: Awake/[name] Orientation Level: Appears intact for tasks assessed Behavior During Session: [facility] for tasks performed    Extremity/Trunk Assessment Right Lower Extremity Assessment RLE ROM/Strength/Tone: WFL for tasks assessed RLE Coordination: WFL - gross motor Left Lower Extremity Assessment LLE ROM/Strength/Tone: Deficits LLE ROM/Strength/Tone Deficits: Strength at least 3+/5 with functional activity. L LE with significant swelling.  Trunk Assessment Trunk Assessment: Normal   Balance    End of Session PT - End of Session Equipment Utilized During Treatment: Gait belt Activity Tolerance: Patient limited by pain;Patient limited by fatigue Patient left: in  chair;with call bell/phone within reach   [name] [facility] 02/01/2012, 2:23 PM [phone]

## 2012-02-01 NOTE — Progress Notes (Signed)
Pt up ambulating with a walker and PT, tolerated well. Dsg to both legs changed and cleaned with Prevantics swab and new gauze dsg reapplied to both. Medicated x1 for pain today. Appetite good Pt. Ambulating to the bathroom without assist.

## 2012-02-01 NOTE — Progress Notes (Signed)
ANTICOAGULATION CONSULT NOTE - Follow Up Consult  Pharmacy Consult for IV heparin/warfarin Indication: Bilateral DVT  No Known Allergies  Patient Measurements: Height: 6\' 2"  (188 cm) Weight: 176 lb 9.4 oz (80.1 kg) IBW/kg (Calculated) : 82.2   Vital Signs: Temp: 98.8 F (37.1 C) (05/27 0500) Temp src: Oral (05/27 0500) BP: 139/81 mmHg (05/27 0500) Pulse Rate: 79  (05/27 0500)  Labs:  Basename 02/01/12 0345 01/31/12 0456 01/30/12 1219 01/30/12 0833 01/30/12 0219  HGB 9.9* 9.4* -- -- --  HCT 31.2* 28.5* -- -- 28.1*  PLT 440* 309 -- -- 209  APTT -- -- -- -- --  LABPROT 12.0 12.4 11.9 -- --  INR 0.87 0.91 0.86 -- --  HEPARINUNFRC 0.43 0.53 -- 0.37 --  CREATININE 0.72 0.74 -- -- 0.62  CKTOTAL -- -- -- -- --  CKMB -- -- -- -- --  TROPONINI -- -- -- -- --    Estimated Creatinine Clearance: 130.7 ml/min (by C-G formula based on Cr of 0.72).  Assessment:  46 YO homeless male with h/o PE placed on Coumadin in the past but has not taken it x 6 months, patient has an IVC filter in place. Patient presented 5/17 with L ankle pain and swelling. LLE venous duplex at the time negative for DVT (+ for superficial thrombosis).  Patient returns 5/20 with similar complaints and preliminary BLE venous duplex + for bilateral DVT. Lovenox started for VTE treatment. On 5/21 - arterial occlusion noted Lovenox changed to IV heparin.  IR started tenecteplase infusion (2sites) on 5/22 and off on 5/24.   Venogram 5/23 with improved flow in BLE and 5/24 venogram with thrombus largely cleared/lysed.  Received new orders from Radiologist to resume heparin to achieve normal therapeutic goal now off of TNKase.  CBC stable  No reported bleeding, no further oozing from TNKase site  Heparin level therapeutic  INR subtherapeutic after 2 x  7.5mg  Warfarin.   Goal of therapy: INR 2-3 Heparin level 0.3-0.7 units/ml Monitor platelets by anticoagulation protocol: Yes   Plan:   Continue IV heparin @  2500 units/hr  For 15mg  warfarin today  Monitor daily CBC, INR and heparin level  Today will be Day 3/5 of heparin/warfarin overlap   Otho Bellows, PharmD,  Pager [phone] 02/01/2012 9:37 AM

## 2012-02-01 NOTE — Progress Notes (Signed)
Patient ID: [name]  male  FAO:[id number]    DOB: May 17, 1966    DOA: 01/25/2012  PCP: No primary provider on file.  Subjective: Patient with  improvement in LLE swelling and pain post thrombolysis. Patient with no further blood oozing around IV site and catheter site per nursing. Patient denies any CP and SOB.  No complaints.  Objective: Weight change:   Intake/Output Summary (Last 24 hours) at 02/01/12 0852 Last data filed at 02/01/12 0500  Gross per 24 hour  Intake 2939.17 ml  Output   2020 ml  Net 919.17 ml   Blood pressure 139/81, pulse 79, temperature 98.8 F (37.1 C), temperature source Oral, resp. rate 18, height 6\' 2"  (1.88 m), weight 80.1 kg (176 lb 9.4 oz), SpO2 98.00%.  Physical Exam: General: Alert and awake, oriented x3, not in any acute distress. HEENT: anicteric sclera, pupils reactive to light and accommodation, EOMI CVS: S1-S2 clear, no murmur rubs or gallops Chest: clear to auscultation bilaterally, no wheezing, rales or rhonchi in anterior lung fields. Abdomen: soft nontender, nondistended, normal bowel sounds, no organomegaly. Bruise on R flank unchanged. Extremities: no cyanosis, clubbing. Left LE with 1- 2 + edema > right LE trace +edema, Decrease in L thigh swelling and pain.   Lab Results: Basic Metabolic Panel:  Lab 02/01/12 6962 01/31/12 0456 01/30/12 0219  NA 138 139 --  K 3.6 4.0 --  CL 103 106 --  CO2 26 28 --  GLUCOSE 98 81 --  BUN 5* 5* --  CREATININE 0.72 0.74 --  CALCIUM 8.6 8.0* --  MG -- -- 2.0  PHOS -- -- --   CBC:  Lab 02/01/12 0345 01/31/12 0456 01/25/12 1310  WBC 8.0 8.2 --  NEUTROABS -- -- 11.0*  HGB 9.9* 9.4* --  HCT 31.2* 28.5* --  MCV 103.7* 103.3* --  PLT 440* 309 --   Cardiac Enzymes:  Lab 01/25/12 1316  CKTOTAL --  CKMB --  CKMBINDEX --  TROPONINI <0.30    Studies/Results: Dg Chest 2 View  01/25/2012  *RADIOLOGY REPORT*  Clinical Data: Cough, dyspnea  CHEST - 2 VIEW  Comparison: 01/22/2012 CT of the chest   Findings: Cardiomediastinal silhouette is stable.  No acute infiltrate or pleural effusion.  No pulmonary edema.  Calcified granuloma left lower lobe is stable.  IMPRESSION: No active disease.  No significant change.  Original Report Authenticated By: Natasha Mead, M.D.   Ct Angio Chest W/cm &/or Wo Cm   IMPRESSION: No definite evidence of pulmonary embolism identified on exam limited by suboptimal pulmonary arterial opacification, despite repeating exam. Few scattered chronic lung changes as above. No definite acute intrathoracic abnormality identified.  Original Report Authenticated By: Lollie Marrow, M.D.   Ct Femur Left Wo Contrast  .  IMPRESSION:  1. Possible arterial occlusion in the left thigh- correlate with distal pulses and consider Doppler assessment or CT angiogram. 2.  Diffuse bladder wall thickening may reflect cystitis.  There is surrounding fluid in the prevesical space.  There is also pelvic mesenteric edema and presacral edema. 3.  Diffuse subcutaneous edema is present in the left thigh, with abnormal edema tracking along the fascial planes, but without drainable abscess observed.  There is also perivascular edema along the deep venous structures and adjacent arterial structures.  Original Report Authenticated By: Dellia Cloud, M.D.    Medications: Scheduled Meds:    . docusate sodium  100 mg Oral BID  . folic acid  1 mg Oral Daily  .  mulitivitamin with minerals  1 tablet Oral Daily  . pantoprazole  40 mg Oral Q1200  . sodium chloride  3 mL Intravenous Q12H  . thiamine  100 mg Oral Daily   Or  . thiamine  100 mg Intravenous Daily  . warfarin  7.5 mg Oral ONCE-1800  . warfarin   Does not apply Once  . Warfarin - Pharmacist Dosing Inpatient   Does not apply q1800   Continuous Infusions:    . heparin 2,500 Units/hr (01/31/12 2355)     Assessment/Plan: Active Problems:  Bilateral lower extremity DVT with extensive clot, left thigh edema/IVC thrombus - S/P  CATHETER DIRECTED TNK lysis 01/27/12 per IR -s/p bilateral venograms 01/28/12 with improved flow in both lower extremity veins with residual non-occlusive thrombus. Flow in IVC but residual clot and irregularity in IVC below filter. -  CT left femur done to rule out any hematoma, compartment syndrome given patient did have falls in the recent past and started on anticoagulation. Stat CT left femur did not show any hematoma or compartment syndrome/fasciitis however raised the question of possible arterial occlusion. - Dr Isidoro Donning discussed in detail with vascular surgery, Dr. Myra Gianotti (on-call), who recommended to obtain stat CT angiogram which was done with prelim results negative. CT of the left high showed possible arterial occlusion in the left thigh. Patient did not have the signs for acute ischemia, Doppler was not available. stat CT angiogram was done which per preliminary report by Dr. Ova Freshwater (radiology ) and Dr. Myra Gianotti (vascular surgery) is negative for any acute arterial occlusion.  -  hypercoagulable panel with positive lupus anticoagulant . Patient with resolution of oozing of blood around IV site and catheter site.  Continue IV heparin and coumadin May need malignancy work up, CT chest negative. Will need colonscopy as outpatient. IR ff and appreciate input and rxcs. Per IR completion of thrombolysis. S/p angioplasty of L iliac vein. Compression stockings. Appreciate hematology input and rxcs. Due to extensive DVT and clot burden will need to await until INR is therapeutic prior to discharge. Patient will subsequently f/u with Dr Gaylyn Rong in hematology clinic per consult notes.  Cystitis on the CT imaging - Urine culture with multiple bacterial morphotypes. Patient s/p rocephin D4 . -  IV Rocephin d/c'd  Hypokalemia Repleted.  Hyponatremia Likely secondary to volume depletion vs beer potomania Improved with hydration. CT chest negative. NSL IVF.  Follow  Anemia Likely secondary to  oozing of blood in setting of heparin and TNK. Oozing resolved.  Anemia panel c/w iron deficiency. H/H stable at 9.9. Follow H/H. Transfusion threshhold Hgb <8.   Hx ETOH abuse Stable. No s/sx of withdrawal. On CIWA protocol.  Prophylaxis PPI for GI, on heparin and tnk for DVT.    Code Status: Full code  Disposition: Not medically ready   LOS: 7 days   [facility] M.D. [facility] 02/01/2012, 8:52 AM Pager: [phone]

## 2012-02-02 ENCOUNTER — Encounter (HOSPITAL_COMMUNITY): Payer: Self-pay | Admitting: Internal Medicine

## 2012-02-02 DIAGNOSIS — D649 Anemia, unspecified: Secondary | ICD-10-CM

## 2012-02-02 DIAGNOSIS — I749 Embolism and thrombosis of unspecified artery: Secondary | ICD-10-CM

## 2012-02-02 DIAGNOSIS — E871 Hypo-osmolality and hyponatremia: Secondary | ICD-10-CM

## 2012-02-02 DIAGNOSIS — M79609 Pain in unspecified limb: Secondary | ICD-10-CM

## 2012-02-02 DIAGNOSIS — D6862 Lupus anticoagulant syndrome: Secondary | ICD-10-CM

## 2012-02-02 HISTORY — DX: Lupus anticoagulant syndrome: D68.62

## 2012-02-02 LAB — BASIC METABOLIC PANEL
BUN: 5 mg/dL — ABNORMAL LOW (ref 6–23)
CO2: 27 mEq/L (ref 19–32)
Chloride: 103 mEq/L (ref 96–112)
Creatinine, Ser: 0.68 mg/dL (ref 0.50–1.35)
Glucose, Bld: 98 mg/dL (ref 70–99)
Potassium: 3.9 mEq/L (ref 3.5–5.1)

## 2012-02-02 LAB — CBC
HCT: 31.6 % — ABNORMAL LOW (ref 39.0–52.0)
Hemoglobin: 10 g/dL — ABNORMAL LOW (ref 13.0–17.0)
MCH: 32.8 pg (ref 26.0–34.0)
MCHC: 31.6 g/dL (ref 30.0–36.0)
RDW: 14.9 % (ref 11.5–15.5)

## 2012-02-02 LAB — PROTIME-INR: Prothrombin Time: 15.4 seconds — ABNORMAL HIGH (ref 11.6–15.2)

## 2012-02-02 MED ORDER — WARFARIN SODIUM 2.5 MG PO TABS
12.5000 mg | ORAL_TABLET | Freq: Once | ORAL | Status: AC
  Administered 2012-02-02: 12.5 mg via ORAL
  Filled 2012-02-02: qty 1

## 2012-02-02 NOTE — Progress Notes (Signed)
ANTICOAGULATION CONSULT NOTE - Follow Up Consult  Pharmacy Consult for IV heparin/warfarin Indication: Bilateral DVT  No Known Allergies  Patient Measurements: Height: 6\' 2"  (188 cm) Weight: 176 lb 9.4 oz (80.1 kg) IBW/kg (Calculated) : 82.2   Vital Signs: Temp: 99.3 F (37.4 C) (05/28 0524) Temp src: Oral (05/28 0524) BP: 147/78 mmHg (05/28 0524) Pulse Rate: 83  (05/28 0524)  Labs:  Basename 02/02/12 0510 02/01/12 0345 01/31/12 0456  HGB 10.0* 9.9* --  HCT 31.6* 31.2* 28.5*  PLT 503* 440* 309  APTT -- -- --  LABPROT 15.4* 12.0 12.4  INR 1.19 0.87 0.91  HEPARINUNFRC 0.35 0.43 0.53  CREATININE 0.68 0.72 0.74  CKTOTAL -- -- --  CKMB -- -- --  TROPONINI -- -- --    Estimated Creatinine Clearance: 130.7 ml/min (by C-G formula based on Cr of 0.68).  Assessment:  46 YO homeless male with h/o PE placed on Coumadin in the past but had not taken x 6 months PTA. Patient has an IVC filter. Patient presented 5/17 with L ankle pain and swelling. LLE venous duplex at the time negative for DVT (+ for superficial thrombosis).  Patient returned 5/20 with similar complaints and preliminary BLE venous duplex + for bilateral DVT. Lovenox started for VTE treatment. On 5/21 - arterial occlusion noted Lovenox changed to IV heparin.  IR started tenecteplase infusion (2sites) on 5/22 and off on 5/24.   Venogram 5/23 with improved flow in BLE and 5/24 venogram with thrombus largely cleared/lysed.  Received new orders from Radiologist to resume heparin to achieve normal therapeutic goal now off of TNKase.  CBC stable.  No reported bleeding.  Heparin level therapeutic.  INR slow to respond but rising after 15mg  yest. Today is overlap day 4/5.    Goal of therapy: INR 2-3 Heparin level 0.3-0.7 units/ml Monitor platelets by anticoagulation protocol: Yes   Plan:   Continue IV heparin @ 2500 units/hr.  Give Coumadin 12.5mg  today.  Monitor daily CBC, INR and heparin level.  Charolotte Eke, PharmD, pager [phone]. 02/02/2012,8:42 AM.

## 2012-02-02 NOTE — Progress Notes (Signed)
Patient ID: [name]  male  BJY:[id number]    DOB: [birth year]    DOA: 01/25/2012  PCP: No primary provider on file. Interval History. Patient was admitted with bilateral DVTs and initially placed on full dose Lovenox and monitored. Patient continued to have worsening pain in his legs with worsening left thigh edema. CT scan of the left femur was done to rule out hematoma or compartment syndrome which was negative. Patient's Lovenox was discontinued and patient was started on a heparin drip. Attending physician at that time Dr. Isidoro Donning discussed his case with Dr. Myra Gianotti of vascular surgery who had recommended obtaining a CT angiogram to rule out arterial occlusion. Hypercoagulable panel was also ordered at this time. CT angiogram which was done was negative for arterial occlusion. Due to patient's extensive bilateral DVTs and worsening symptoms an interventional radiology consultation was obtained for possible symbolize. Patient was seen by interventional radiology and patient underwent venograms that showed extensive left popliteal femoral and iliac DVT and right femoral and iliac DVT with caval thrombus to the filter. Patient subsequently underwent catheter directed TNK. lysis via bilateral popliteal vein catheters and patient was subsequently transferred to the ICU. Patient underwent thrombolysis x48 hours. 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 Hypercoagulable panel which was obtained on admission did show patient did have a positive antiphospholipid antibody, lupus anticoagulant. Oncology consultation was obtained and patient was seen in consultation by Dr. Gaylyn Rong. It was recommended per oncology 2 continue IV heparin protocol and patient's Coumadin was resumed. Social worker and Sports coach were also involved for placement of patient's meds. Was also recommended per oncology.upon discharge patient be discharged on 10-14 days of Coumadin  and followed by the cancer center clinic for further management. The patient was subsequently transferred to the floor where he remained in stable condition. Patient continued to improve slowly been daily and his PT/INR levels are being monitored. Due to patient's large clot burden his INR will need to be therapeutic prior to discharge.   Consults #1 interventional radiology: Drs Hassell/Henn/Yamagata #2 hematology oncology: Dr. Gaylyn Rong  Subjective: Patient with  improvement in LLE swelling and pain post thrombolysis. Patient with no further blood oozing around IV site and catheter site per nursing. Patient denies any CP and SOB.  No complaints. Patient able to participate today with PT for the rolling walker.  Objective: Weight change:   Intake/Output Summary (Last 24 hours) at 02/02/12 1547 Last data filed at 02/02/12 1500  Gross per 24 hour  Intake 2191.1 ml  Output   4825 ml  Net -2633.9 ml   Blood pressure 125/70, pulse 89, temperature 98.5 F (36.9 C), temperature source Oral, resp. rate 18, height 6\' 2"  (1.88 m), weight 80.1 kg (176 lb 9.4 oz), SpO2 97.00%.  Physical Exam: General: Alert and awake, oriented x3, not in any acute distress. HEENT: anicteric sclera, pupils reactive to light and accommodation, EOMI CVS: S1-S2 clear, no murmur rubs or gallops Chest: clear to auscultation bilaterally, no wheezing, rales or rhonchi in anterior lung fields. Abdomen: soft nontender, nondistended, normal bowel sounds, no organomegaly. Bruise on R flank unchanged. Extremities: no cyanosis, clubbing. Left LE with 1- 2 + edema > right LE trace +edema, Decrease in L thigh swelling and pain.   Lab Results: Basic Metabolic Panel:  Lab 02/02/12 0865 02/01/12 0345 01/30/12 0219  NA 139 138 --  K 3.9 3.6 --  CL 103 103 --  CO2 27 26 --  GLUCOSE 98 98 --  BUN 5* 5* --  CREATININE 0.68 0.72 --  CALCIUM 8.8 8.6 --  MG -- -- 2.0  PHOS -- -- --   CBC:  Lab 02/02/12 0510 02/01/12 0345  WBC  9.3 8.0  NEUTROABS -- --  HGB 10.0* 9.9*  HCT 31.6* 31.2*  MCV 103.6* 103.7*  PLT 503* 440*   Cardiac Enzymes: No results found for this basename: CKTOTAL:3,CKMB:3,CKMBINDEX:3,TROPONINI:3 in the last 168 hours  Studies/Results: Dg Chest 2 View  01/25/2012  *RADIOLOGY REPORT*  Clinical Data: Cough, dyspnea  CHEST - 2 VIEW  Comparison: 01/22/2012 CT of the chest  Findings: Cardiomediastinal silhouette is stable.  No acute infiltrate or pleural effusion.  No pulmonary edema.  Calcified granuloma left lower lobe is stable.  IMPRESSION: No active disease.  No significant change.  Original Report Authenticated By: Natasha Mead, M.D.   Ct Angio Chest W/cm &/or Wo Cm   IMPRESSION: No definite evidence of pulmonary embolism identified on exam limited by suboptimal pulmonary arterial opacification, despite repeating exam. Few scattered chronic lung changes as above. No definite acute intrathoracic abnormality identified.  Original Report Authenticated By: Lollie Marrow, M.D.   Ct Femur Left Wo Contrast  .  IMPRESSION:  1. Possible arterial occlusion in the left thigh- correlate with distal pulses and consider Doppler assessment or CT angiogram. 2.  Diffuse bladder wall thickening may reflect cystitis.  There is surrounding fluid in the prevesical space.  There is also pelvic mesenteric edema and presacral edema. 3.  Diffuse subcutaneous edema is present in the left thigh, with abnormal edema tracking along the fascial planes, but without drainable abscess observed.  There is also perivascular edema along the deep venous structures and adjacent arterial structures.  Original Report Authenticated By: Dellia Cloud, M.D.    Medications: Scheduled Meds:    . docusate sodium  100 mg Oral BID  . folic acid  1 mg Oral Daily  . mulitivitamin with minerals  1 tablet Oral Daily  . pantoprazole  40 mg Oral Q1200  . sodium chloride  3 mL Intravenous Q12H  . thiamine  100 mg Oral Daily   Or  .  thiamine  100 mg Intravenous Daily  . warfarin  12.5 mg Oral ONCE-1800  . warfarin   Does not apply Once  . Warfarin - Pharmacist Dosing Inpatient   Does not apply q1800   Continuous Infusions:    . heparin 2,500 Units/hr (02/02/12 1431)     Assessment/Plan: Active Problems:  Bilateral lower extremity DVT with extensive clot, left thigh edema/IVC thrombus - S/P CATHETER DIRECTED TNK lysis 01/27/12 per IR -s/p bilateral venograms 01/28/12 with improved flow in both lower extremity veins with residual non-occlusive thrombus. Flow in IVC but residual clot and irregularity in IVC below filter. -  CT left femur done to rule out any hematoma, compartment syndrome given patient did have falls in the recent past and started on anticoagulation. Stat CT left femur did not show any hematoma or compartment syndrome/fasciitis however raised the question of possible arterial occlusion. - Dr Isidoro Donning discussed in detail with vascular surgery, Dr. Myra Gianotti (on-call), who recommended to obtain stat CT angiogram which was done with prelim results negative. CT of the left high showed possible arterial occlusion in the left thigh. Patient did not have the signs for acute ischemia, Doppler was not available. stat CT angiogram was done which per preliminary report by Dr. Ova Freshwater (radiology ) and Dr. Myra Gianotti (vascular surgery) is negative for  any acute arterial occlusion.  -  hypercoagulable panel with positive lupus anticoagulant . Patient with resolution of oozing of blood around IV site and catheter site.  Continue IV heparin and coumadin May need malignancy work up, CT chest negative. Will need colonscopy as outpatient. IR ff and appreciate input and rxcs. Per IR completion of thrombolysis. S/p angioplasty of L iliac vein. Compression stockings. Appreciate hematology input and rxcs. Due to extensive DVT and clot burden will need to await until INR is therapeutic prior to discharge. Patient will subsequently  f/u with Dr Gaylyn Rong in hematology clinic per consult notes.  Cystitis on the CT imaging - Urine culture with multiple bacterial morphotypes. Patient s/p rocephin D4 . -  IV Rocephin d/c'd  Hypokalemia Repleted.  Hyponatremia Likely secondary to volume depletion vs beer potomania Improved with hydration. CT chest negative. NSL IVF.  Follow  Anemia Likely secondary to oozing of blood in setting of heparin and TNK. Oozing resolved.  Anemia panel c/w iron deficiency. H/H stable at 10.0. Follow H/H. Transfusion threshhold Hgb <8.   Hx ETOH abuse Stable. No s/sx of withdrawal. On CIWA protocol.  Prophylaxis PPI for GI, on heparin and tnk for DVT.    Code Status: Full code  Disposition: Not medically ready   LOS: 8 days   [facility] M.D. [facility] 02/02/2012, 3:47 PM Pager: [phone]

## 2012-02-02 NOTE — Progress Notes (Signed)
Utilization review completed.  

## 2012-02-03 DIAGNOSIS — Z590 Homelessness: Secondary | ICD-10-CM

## 2012-02-03 DIAGNOSIS — M79609 Pain in unspecified limb: Secondary | ICD-10-CM

## 2012-02-03 DIAGNOSIS — D649 Anemia, unspecified: Secondary | ICD-10-CM

## 2012-02-03 DIAGNOSIS — I749 Embolism and thrombosis of unspecified artery: Secondary | ICD-10-CM

## 2012-02-03 LAB — BASIC METABOLIC PANEL
BUN: 5 mg/dL — ABNORMAL LOW (ref 6–23)
CO2: 26 mEq/L (ref 19–32)
Chloride: 103 mEq/L (ref 96–112)
Creatinine, Ser: 0.71 mg/dL (ref 0.50–1.35)
GFR calc Af Amer: >90 mL/min (ref >90)
Glucose, Bld: 106 mg/dL — ABNORMAL HIGH (ref 70–99)
Potassium: 3.8 mEq/L (ref 3.5–5.1)

## 2012-02-03 LAB — CBC
HCT: 29.4 % — ABNORMAL LOW (ref 39.0–52.0)
Hemoglobin: 9.6 g/dL — ABNORMAL LOW (ref 13.0–17.0)
MCH: 33.3 pg (ref 26.0–34.0)
MCHC: 32.7 g/dL (ref 30.0–36.0)
MCV: 102.1 fL — ABNORMAL HIGH (ref 78.0–100.0)
RDW: 14.8 % (ref 11.5–15.5)

## 2012-02-03 LAB — PROTIME-INR: Prothrombin Time: 16.4 seconds — ABNORMAL HIGH (ref 11.6–15.2)

## 2012-02-03 MED ORDER — WARFARIN SODIUM 7.5 MG PO TABS
15.0000 mg | ORAL_TABLET | Freq: Once | ORAL | Status: AC
  Administered 2012-02-03: 15 mg via ORAL
  Filled 2012-02-03: qty 2

## 2012-02-03 NOTE — Progress Notes (Signed)
Physical Therapy Treatment Patient Details Name: [name] MRN: [id number] DOB: [birth year] Today's Date: 02/03/2012 Time: 4098-1191 PT Time Calculation (min): 9 min  PT Assessment / Plan / Recommendation Comments on Treatment Session  Pt has progressed well with mobility since last session. Reports SW/MD are working on d/c arrangements???? May benefit from cane use for ambulation.    Follow Up Recommendations  No PT follow up needs anticipated.    Barriers to Discharge        Equipment Recommendations  Cane    Recommendations for Other Services    Frequency Min 3X/week   Plan Discharge plan needs to be updated    Precautions / Restrictions Precautions Precautions: Fall Restrictions Weight Bearing Restrictions: No   Pertinent Vitals/Pain     Mobility  Bed Mobility Bed Mobility: Supine to Sit Supine to Sit: 6: Modified independent (Device/Increase time) Transfers Transfers: Sit to Stand;Stand to Sit Sit to Stand: 6: Modified independent (Device/Increase time) Stand to Sit: 6: Modified independent (Device/Increase time) Ambulation/Gait Ambulation/Gait Assistance: 5: Supervision Ambulation Distance (Feet): 500 Feet Assistive device:  (pushing IV pole) Ambulation/Gait Assistance Details: Improved mobillity and ambulation distance this session. Pt pushed IV pole. Reports less pain, less fatigue. Gait pattern slightly antalgic at times. Increased sway intermittently. Gait Pattern: Step-through pattern;Decreased stance time - left;Antalgic    Exercises     PT Diagnosis:    PT Problem List:   PT Treatment Interventions:     PT Goals Acute Rehab PT Goals PT Goal: Sit to Stand - Progress: Met PT Goal: Stand to Sit - Progress: Met PT Goal: Ambulate - Progress: Progressing toward goal  Visit Information  Last PT Received On: 02/03/12 Assistance Needed: +1    Subjective Data  Subjective: "I'm surprised...Marland KitchenMarland Kitchenthis leg is not hurting at bad as I thought it would from  walking this far" Patient Stated Goal: regain independence. find some place to stay   Cognition  Overall Cognitive Status: Appears within functional limits for tasks assessed/performed Arousal/Alertness: Awake/alert Orientation Level: Appears intact for tasks assessed Behavior During Session: [facility] for tasks performed    Balance     End of Session PT - End of Session Equipment Utilized During Treatment: Gait belt Activity Tolerance: Patient tolerated treatment well Patient left: in bed;with call bell/phone within reach    [name] [facility] 02/03/2012, 2:32 PM [phone]

## 2012-02-03 NOTE — Progress Notes (Signed)
ANTICOAGULATION CONSULT NOTE - Follow Up Consult  Pharmacy Consult for IV heparin/warfarin Indication: Bilateral DVT  No Known Allergies  Patient Measurements: Height: 6\' 2"  (188 cm) Weight: 176 lb 9.4 oz (80.1 kg) IBW/kg (Calculated) : 82.2   Vital Signs: Temp: 98.8 F (37.1 C) (05/29 0531) Temp src: Oral (05/29 0531) BP: 129/79 mmHg (05/29 0531) Pulse Rate: 72  (05/29 0531)  Labs:  Basename 02/03/12 0330 02/02/12 0510 02/01/12 0345  HGB 9.6* 10.0* --  HCT 29.4* 31.6* 31.2*  PLT 586* 503* 440*  APTT -- -- --  LABPROT 16.4* 15.4* 12.0  INR 1.30 1.19 0.87  HEPARINUNFRC 0.50 0.35 0.43  CREATININE 0.71 0.68 0.72  CKTOTAL -- -- --  CKMB -- -- --  TROPONINI -- -- --    Estimated Creatinine Clearance: 130.7 ml/min (by C-G formula based on Cr of 0.71).  Assessment:  46 YO homeless male with h/o PE placed on Coumadin in the past but had not taken x 6 months PTA. Patient has an IVC filter. Patient presented 5/17 with L ankle pain and swelling. LLE venous duplex at the time negative for DVT (+ for superficial thrombosis).  Patient returned 5/20 with similar complaints and preliminary BLE venous duplex + for bilateral DVT. Lovenox started for VTE treatment. On 5/21 - arterial occlusion noted Lovenox changed to IV heparin.  IR started tenecteplase infusion (2sites) on 5/22 and off on 5/24.   Venogram 5/23 with improved flow in BLE and 5/24 venogram with thrombus largely cleared/lysed.  Received new orders from Radiologist to resume heparin to achieve normal therapeutic goal now off of TNKase.  CBC stable.  No reported bleeding.  Heparin level therapeutic.  INR rising slowly. Today is overlap day 5/5 but per CHEST guidelines, need to also have INR >or= 2 x 24hrs before stopping heparin.   Goal of therapy: INR 2-3 Heparin level 0.3-0.7 units/ml Monitor platelets by anticoagulation protocol: Yes   Plan:   Continue IV heparin @ 2500 units/hr.  Give Coumadin 15mg   today.  Monitor daily CBC, INR and heparin level.  Charolotte Eke, PharmD, pager [phone]. 02/03/2012,8:17 AM.

## 2012-02-03 NOTE — Progress Notes (Signed)
Subjective: Patient presently has no complaints. He has been up ambulating in the halls. He still subtherapeutic on his Coumadin. Currently the patient is homeless and cannot go to a shelter with injectables. Thus is important that the patient has his INR therapeutic prior to being discharged from the hospital. Furthermore given the extent of this patient's DVT is been advised that the patient should be on IV heparin bridging with Coumadin. Objective: Filed Vitals:   02/02/12 0524 02/02/12 1410 02/02/12 2101 02/03/12 0531  BP: 147/78 125/70 129/74 129/79  Pulse: 83 89 70 72  Temp: 99.3 F (37.4 C) 98.5 F (36.9 C) 98.8 F (37.1 C) 98.8 F (37.1 C)  TempSrc: Oral Oral Oral Oral  Resp: 16 18 16 16   Height:      Weight:      SpO2: 96% 97% 95% 94%   Weight change:   Intake/Output Summary (Last 24 hours) at 02/03/12 1832 Last data filed at 02/03/12 1322  Gross per 24 hour  Intake 1044.1 ml  Output   2200 ml  Net -1155.9 ml    General: Alert, awake, oriented x3, in no acute distress.  HEENT: [place]/AT [place]ER[place], EOMI Neck: Trachea midline,  no masses, no thyromegal,y no JVD, no carotid bruit OROPHARYNX:  Moist, No exudate/ erythema/lesions.  Heart: Regular rate and rhythm, without murmurs, rubs, gallops, PMI non-displaced, no heaves or thrills on palpation.  Lungs: Clear to auscultation, no wheezing or rhonchi noted. No increased vocal fremitus resonant to percussion  Abdomen: Soft, nontender, nondistended, positive bowel sounds, no masses no hepatosplenomegaly noted..  Neuro: No focal neurological deficits noted cranial nerves II through XII grossly intact. DTRs 2+ bilaterally upper and lower extremities. Strength functional in bilateral upper and lower extremities. Musculoskeletal: No warm swelling or erythema around joints, no spinal tenderness noted. Puncture sites on the skin show no evidence of infection.     Lab Results:  Basename 02/03/12 0330 02/02/12 0510  NA 139 139  K 3.8  3.9  CL 103 103  CO2 26 27  GLUCOSE 106* 98  BUN 5* 5*  CREATININE 0.71 0.68  CALCIUM 8.4 8.8  MG -- --  PHOS -- --   No results found for this basename: AST:2,ALT:2,ALKPHOS:2,BILITOT:2,PROT:2,ALBUMIN:2 in the last 72 hours No results found for this basename: LIPASE:2,AMYLASE:2 in the last 72 hours  Basename 02/03/12 0330 02/02/12 0510  WBC 10.0 9.3  NEUTROABS -- --  HGB 9.6* 10.0*  HCT 29.4* 31.6*  MCV 102.1* 103.6*  PLT 586* 503*   No results found for this basename: CKTOTAL:3,CKMB:3,CKMBINDEX:3,TROPONINI:3 in the last 72 hours No components found with this basename: POCBNP:3 No results found for this basename: DDIMER:2 in the last 72 hours No results found for this basename: HGBA1C:2 in the last 72 hours No results found for this basename: CHOL:2,HDL:2,LDLCALC:2,TRIG:2,CHOLHDL:2,LDLDIRECT:2 in the last 72 hours No results found for this basename: TSH,T4TOTAL,FREET3,T3FREE,THYROIDAB in the last 72 hours No results found for this basename: VITAMINB12:2,FOLATE:2,FERRITIN:2,TIBC:2,IRON:2,RETICCTPCT:2 in the last 72 hours  Micro Results: Recent Results (from the past 240 hour(s))  URINE CULTURE     Status: Normal   Collection Time   01/26/12 11:51 AM      Component Value Range Status Comment   Specimen Description URINE, RANDOM   Final    Special Requests NONE   Final    Culture  Setup Time [id number]   Final    Colony Count >=100,000 COLONIES/ML   Final    Culture     Final    Value: Multiple  bacterial morphotypes present, none predominant. Suggest appropriate recollection if clinically indicated.   Report Status 01/27/2012 FINAL   Final   MRSA PCR SCREENING     Status: Normal   Collection Time   01/27/12  6:19 PM      Component Value Range Status Comment   MRSA by PCR NEGATIVE  NEGATIVE  Final   URINE CULTURE     Status: Normal   Collection Time   01/28/12  4:45 PM      Component Value Range Status Comment   Specimen Description URINE, CLEAN CATCH   Final    Special  Requests NONE   Final    Culture  Setup Time [id number]   Final    Colony Count NO GROWTH   Final    Culture NO GROWTH   Final    Report Status 01/29/2012 FINAL   Final     Studies/Results: Dg Chest 2 View  01/25/2012  *RADIOLOGY REPORT*  Clinical Data: Cough, dyspnea  CHEST - 2 VIEW  Comparison: 01/22/2012 CT of the chest  Findings: Cardiomediastinal silhouette is stable.  No acute infiltrate or pleural effusion.  No pulmonary edema.  Calcified granuloma left lower lobe is stable.  IMPRESSION: No active disease.  No significant change.  Original Report Authenticated By: Natasha Mead, M.D.   Ct Angio Chest W/cm &/or Wo Cm  01/22/2012  *RADIOLOGY REPORT*  Clinical Data: Shortness of breath, left leg pain, history of deep venous thrombosis, question pulmonary embolism  CT ANGIOGRAPHY CHEST  Technique:  Multidetector CT imaging of the chest using the standard protocol during bolus administration of intravenous contrast. Multiplanar reconstructed images including MIPs were obtained and reviewed to evaluate the vascular anatomy. Due to inadequate opacification of the pulmonary arterial system on initial exam, the patient was reinjected with contrast and the exam repeated.  Contrast: OMNIPAQUE IOHEXOL 300 MG/ML SOLN; 80 ml of Omnipaque 300  Comparison: 03/01/2011  Findings: Aorta normal caliber without aneurysm or dissection. No thoracic adenopathy. Pulmonary arteries suboptimally opacified but grossly patent. No definite evidence of pulmonary embolism. Visualized portion of upper abdomen normal appearance. Minimal linear scarring versus chronic atelectasis lateral right lower lobe, minimally nodular in appearance but unchanged since 03/01/2011. Large calcified granuloma superior segment left lower lobe. Additional scattered chronic lung changes. No definite pulmonary infiltrate, pleural effusion or pneumothorax. Osseous structures unremarkable.  IMPRESSION: No definite evidence of pulmonary embolism  identified on exam limited by suboptimal pulmonary arterial opacification, despite repeating exam. Few scattered chronic lung changes as above. No definite acute intrathoracic abnormality identified.  Original Report Authenticated By: Lollie Marrow, M.D.   Ir Veno/ext/bi  01/29/2012  *RADIOLOGY REPORT*  Clinical data:  Bilateral lower extremity DVT.  Previous IVC filter placement for pulmonary emboli.  BILATERAL LOWER EXTREMITY VENOGRAPHY INFERIOR VENA CAVOGRAPHY INITIATION OF CATHETER DIRECTED THROMBOLYSIS ULTRASOUND GUIDANCE FOR VENOUS ACCESS X2  Comparison:  CT 01/26/2012  Technique and findings: The procedure, risks (including but not limited to bleeding, infection, organ damage), benefits, and alternatives were explained to the patient.  Questions regarding the procedure were encouraged and answered.  The patient understands and consents to the procedure.The popliteal regions were prepped and draped in usual sterile fashion. Maximal barrier sterile technique was utilized including caps, mask, sterile gowns, sterile gloves, sterile drape, hand hygiene and skin antiseptic.  Intravenous Fentanyl and Versed were administered as conscious sedation during continuous cardiorespiratory monitoring by the radiology RN, with a total moderate sedation time of less than 30 minutes.  Skin was  infiltrated locally with 1% lidocaine.  Under real time ultrasound guidance, the left popliteal vein was accessed with a 21- gauge micropuncture needle.  The vein was thrombosed.  There was no blood return.  However, the 0.018 guide wire advanced easily, its position confirmed on fluoroscopy.  The needle was exchanged for a micropuncture dilator.  A small contrast injection confirmed appropriate intraluminal venous placement.  The dilator was exchanged over [facility] wire for a 6-French vascular sheath, through which a 5-French Kumpe catheter was advanced for left lower extremity venography and inferior vena cavography.  This  demonstrated occlusive thrombus throughout the left femoropopliteal and  iliac venous system and in the infrarenal IVC up to the level of the IVC filter.  The catheter was exchanged over the guide wire for a 90-cm infusion catheter with 50 cm infusion length.  This was positioned spanning the iliofemoral thrombus.  In similar fashion, on the right a posterior tibial vein which was patent was accessed with a 21-gauge micropuncture needle.  The guide wire advanced centrally easily.  The transitional dilator was placed, allowed advancement of a Benson wire centrally.  A 5-French Kumpe catheter was advanced, for right lower extremity venography. This demonstrated patency of the popliteal vein with occlusive thrombus in the mid aspect of the femoral vein and extending centrally through the right iliac venous system.  The Kumpe catheter was exchanged for a 6-French vascular sheath, through which a 90-cm infusion catheter with 40 cm infusion length was placed, with infusion side holes spanning from above the IVC filter to the proximal femoral vein.  Transcatheter protocol tenecteplase infusion was initiated bilaterally.  The sheaths were secured to the skin with O-Prolene sutures and catheters secured to the sheaths with Steri-Strips, then covered with sterile dressing.  The patient transferred to the ICU. The patient tolerated the procedure well.  No immediate complication.  IMPRESSION: 1.  Extensive occlusive left DVT through the femoropopliteal and iliac venous systems. 2.  Extensive occlusive right DVT   from the mid femoral vein through the iliac venous system. 3.  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 4.  Successful initiation of catheter directed bilateral venous thrombolytic infusion.  Plan is for follow-up venography 5/23.  Original Report Authenticated By: Osa Craver, M.D.   Ir Transcath/rx/inf Non Thrombolysis  01/29/2012  *RADIOLOGY REPORT*  Clinical data:  Bilateral lower  extremity DVT.  Previous IVC filter placement for pulmonary emboli.  BILATERAL LOWER EXTREMITY VENOGRAPHY INFERIOR VENA CAVOGRAPHY INITIATION OF CATHETER DIRECTED THROMBOLYSIS ULTRASOUND GUIDANCE FOR VENOUS ACCESS X2  Comparison:  CT 01/26/2012  Technique and findings: The procedure, risks (including but not limited to bleeding, infection, organ damage), benefits, and alternatives were explained to the patient.  Questions regarding the procedure were encouraged and answered.  The patient understands and consents to the procedure.The popliteal regions were prepped and draped in usual sterile fashion. Maximal barrier sterile technique was utilized including caps, mask, sterile gowns, sterile gloves, sterile drape, hand hygiene and skin antiseptic.  Intravenous Fentanyl and Versed were administered as conscious sedation during continuous cardiorespiratory monitoring by the radiology RN, with a total moderate sedation time of less than 30 minutes.  Skin was infiltrated locally with 1% lidocaine.  Under real time ultrasound guidance, the left popliteal vein was accessed with a 21- gauge micropuncture needle.  The vein was thrombosed.  There was no blood return.  However, the 0.018 guide wire advanced easily, its position confirmed on fluoroscopy.  The needle was exchanged  for a micropuncture dilator.  A small contrast injection confirmed appropriate intraluminal venous placement.  The dilator was exchanged over [facility] wire for a 6-French vascular sheath, through which a 5-French Kumpe catheter was advanced for left lower extremity venography and inferior vena cavography.  This demonstrated occlusive thrombus throughout the left femoropopliteal and  iliac venous system and in the infrarenal IVC up to the level of the IVC filter.  The catheter was exchanged over the guide wire for a 90-cm infusion catheter with 50 cm infusion length.  This was positioned spanning the iliofemoral thrombus.  In similar fashion, on the right  a posterior tibial vein which was patent was accessed with a 21-gauge micropuncture needle.  The guide wire advanced centrally easily.  The transitional dilator was placed, allowed advancement of a Benson wire centrally.  A 5-French Kumpe catheter was advanced, for right lower extremity venography. This demonstrated patency of the popliteal vein with occlusive thrombus in the mid aspect of the femoral vein and extending centrally through the right iliac venous system.  The Kumpe catheter was exchanged for a 6-French vascular sheath, through which a 90-cm infusion catheter with 40 cm infusion length was placed, with infusion side holes spanning from above the IVC filter to the proximal femoral vein.  Transcatheter protocol tenecteplase infusion was initiated bilaterally.  The sheaths were secured to the skin with O-Prolene sutures and catheters secured to the sheaths with Steri-Strips, then covered with sterile dressing.  The patient transferred to the ICU. The patient tolerated the procedure well.  No immediate complication.  IMPRESSION: 1.  Extensive occlusive left DVT through the femoropopliteal and iliac venous systems. 2.  Extensive occlusive right DVT   from the mid femoral vein through the iliac venous system. 3.  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 4.  Successful initiation of catheter directed bilateral venous thrombolytic infusion.  Plan is for follow-up venography 5/23.  Original Report Authenticated By: Osa Craver, M.D.   Ir Transcath/rx/inf Non Thrombolysis  01/29/2012  *RADIOLOGY REPORT*  Clinical data:  Bilateral lower extremity DVT.  Previous IVC filter placement for pulmonary emboli.  BILATERAL LOWER EXTREMITY VENOGRAPHY INFERIOR VENA CAVOGRAPHY INITIATION OF CATHETER DIRECTED THROMBOLYSIS ULTRASOUND GUIDANCE FOR VENOUS ACCESS X2  Comparison:  CT 01/26/2012  Technique and findings: The procedure, risks (including but not limited to bleeding, infection, organ  damage), benefits, and alternatives were explained to the patient.  Questions regarding the procedure were encouraged and answered.  The patient understands and consents to the procedure.The popliteal regions were prepped and draped in usual sterile fashion. Maximal barrier sterile technique was utilized including caps, mask, sterile gowns, sterile gloves, sterile drape, hand hygiene and skin antiseptic.  Intravenous Fentanyl and Versed were administered as conscious sedation during continuous cardiorespiratory monitoring by the radiology RN, with a total moderate sedation time of less than 30 minutes.  Skin was infiltrated locally with 1% lidocaine.  Under real time ultrasound guidance, the left popliteal vein was accessed with a 21- gauge micropuncture needle.  The vein was thrombosed.  There was no blood return.  However, the 0.018 guide wire advanced easily, its position confirmed on fluoroscopy.  The needle was exchanged for a micropuncture dilator.  A small contrast injection confirmed appropriate intraluminal venous placement.  The dilator was exchanged over [facility] wire for a 6-French vascular sheath, through which a 5-French Kumpe catheter was advanced for left lower extremity venography and inferior vena cavography.  This demonstrated occlusive thrombus throughout the left  femoropopliteal and  iliac venous system and in the infrarenal IVC up to the level of the IVC filter.  The catheter was exchanged over the guide wire for a 90-cm infusion catheter with 50 cm infusion length.  This was positioned spanning the iliofemoral thrombus.  In similar fashion, on the right a posterior tibial vein which was patent was accessed with a 21-gauge micropuncture needle.  The guide wire advanced centrally easily.  The transitional dilator was placed, allowed advancement of a Benson wire centrally.  A 5-French Kumpe catheter was advanced, for right lower extremity venography. This demonstrated patency of the popliteal vein  with occlusive thrombus in the mid aspect of the femoral vein and extending centrally through the right iliac venous system.  The Kumpe catheter was exchanged for a 6-French vascular sheath, through which a 90-cm infusion catheter with 40 cm infusion length was placed, with infusion side holes spanning from above the IVC filter to the proximal femoral vein.  Transcatheter protocol tenecteplase infusion was initiated bilaterally.  The sheaths were secured to the skin with O-Prolene sutures and catheters secured to the sheaths with Steri-Strips, then covered with sterile dressing.  The patient transferred to the ICU. The patient tolerated the procedure well.  No immediate complication.  IMPRESSION: 1.  Extensive occlusive left DVT through the femoropopliteal and iliac venous systems. 2.  Extensive occlusive right DVT   from the mid femoral vein through the iliac venous system. 3.  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 4.  Successful initiation of catheter directed bilateral venous thrombolytic infusion.  Plan is for follow-up venography 5/23.  Original Report Authenticated By: Osa Craver, M.D.   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  Started thrombolytic therapy on 01/27/2012.  PROCEDURE(S): BILATERAL LOWER EXTREMITY ANGIOGRAPHY follow-up after thrombolytics  Physician: Rachelle Hora. Henn, MD  Fluoroscopy time: 1.4 minutes  Contrast:  70 ml Omnipaque-300  Procedure:The patient was placed prone on the interventional table. Contrast was injected through the side arm of the bilateral sheaths.  The right infusion catheter was slightly pulled back towards the IVC filter at the end of the procedure.  The patient was returned to intensive care unit and thrombolytic therapy was restarted.  Findings:Left lower extremity:  There is now flow within the left femoral vein, left  common femoral vein and left iliac veins.  There is residual nonocclusive thrombus in the lower left iliac vein just above the left common femoral vein.  There is residual narrowing and irregularity of the lower IVC most compatible with thrombus. The IVC is patent above the filter.  Right lower extremity:  There is antegrade flow in the left femoral vein and majority of the femoral thrombus has resolved.  Small amount of residual nonocclusive thrombus in the left iliac venous system.  Complications: None  Impression:The flow within the femoral veins and iliac veins have markedly improved.  Majority of the thrombus has resolved in the lower extremities but there is residual nonocclusive thrombus bilaterally.  Residual narrowing and irregularity of the IVC is most likely related to thrombus.  Plan:  Plan to continue tenecteplase infusions overnight.  Patient is scheduled to follow up in interventional radiology on 01/29/2012 for follow-up angiography.  Original Report Authenticated By: Richarda Overlie, M.D.   Ir Angiogram Follow Up Study  01/28/2012  *RADIOLOGY REPORT*  Clinical history:46 year old with bilateral lower extremity deep vein thrombosis  and IVC thrombus.  Started thrombolytic therapy on 01/27/2012.  PROCEDURE(S): BILATERAL LOWER EXTREMITY ANGIOGRAPHY follow-up after thrombolytics  Physician: Rachelle Hora. Henn, MD  Fluoroscopy time: 1.4 minutes  Contrast:  70 ml Omnipaque-300  Procedure:The patient was placed prone on the interventional table. Contrast was injected through the side arm of the bilateral sheaths.  The right infusion catheter was slightly pulled back towards the IVC filter at the end of the procedure.  The patient was returned to intensive care unit and thrombolytic therapy was restarted.  Findings:Left lower extremity:  There is now flow within the left femoral vein, left common femoral vein and left iliac veins.  There is residual nonocclusive thrombus in the lower left iliac vein just above the  left common femoral vein.  There is residual narrowing and irregularity of the lower IVC most compatible with thrombus. The IVC is patent above the filter.  Right lower extremity:  There is antegrade flow in the left femoral vein and majority of the femoral thrombus has resolved.  Small amount of residual nonocclusive thrombus in the left iliac venous system.  Complications: None  Impression:The flow within the femoral veins and iliac veins have markedly improved.  Majority of the thrombus has resolved in the lower extremities but there is residual nonocclusive thrombus bilaterally.  Residual narrowing and irregularity of the IVC is most likely related to thrombus.  Plan:  Plan to continue tenecteplase infusions overnight.  Patient is scheduled to follow up in interventional radiology on 01/29/2012 for follow-up angiography.  Original Report Authenticated By: Richarda Overlie, M.D.   Ct Angio Ao+bifem W/cm &/or Wo/cm  01/26/2012  *RADIOLOGY REPORT*  Clinical Data:  Left thigh edema  CT ANGIOGRAPHY OF ABDOMINAL AORTA WITH ILIOFEMORAL RUNOFF  Technique:  Multidetector CT imaging of the abdomen, pelvis and lower extremities was performed using the standard protocol during bolus administration of intravenous contrast.  Multiplanar CT image reconstructions including MIPs were obtained to evaluate the vascular anatomy.  Contrast: OMNIPAQUE IOHEXOL 350 MG/ML SOLN  Comparison:   None.  Findings:  Aorta is patent.  Celiac and SMA are patent.  Branch vessels of the celiac and SMA are grossly patent.  Single renal arteries are patent.  IMA is patent.  Right internal, external, and common iliac arteries are widely patent.  Right lower extremity runoff demonstrates patency of the right common femoral, superficial femoral, and profunda femoral arteries.  Right popliteal arteries patent.  Tibial vessels are grossly patent to the ankle for three-vessel runoff.  Left common femoral, superficial femoral, and profunda femoral  arteries are patent.  Left popliteal artery is patent.  Tibial vessels are patent grossly for three-vessel runoff.  There is extensive subcutaneous edema involving both lower extremities left greater than right.  Soft tissue edema is seen surrounding the lower abdomen and pelvis. There is also extensive edema within the intraperitoneal fat towards the pelvis.  IVC filter is in place.  There is felt to be at least some low density thrombus within the unopacified infrarenal IVC and within the filter.  The common iliac veins are both expanded and are surrounded by inflammatory changes.  These findings can be seen with DVT of the iliac venous system.  The study was not optimized or protocol for a venous evaluation therefore DVT cannot be excluded.  Diffuse bladder wall thickening.  Diffuse hepatic steatosis.  Kidneys, adrenal glands, spleen, pancreas are stable.  Gallbladder is stable.  Review of the MIP images confirms the above findings.  IMPRESSION: Aorta, iliac  arteries, and lower extremity arteries are patent for three-vessel runoff bilaterally.  IVC filter is in place.  There is felt to be at least some thrombus contained within the filter.  The iliac veins are expanded and surrounded by inflammatory changes.  DVT of the iliac venous system cannot be excluded.  The patient just underwent a contrast-enhanced study.  Initial evaluation should include a Doppler study of the lower extremities.  CT venogram of the IVC and pelvis can be performed for indeterminate findings.  Original Report Authenticated By: Donavan Burnet, M.D.   Ct Femur Left Wo Contrast  01/26/2012  *RADIOLOGY REPORT*  Clinical Data: Left thigh swelling, pain, edema, and erythema.  CT OF THE LEFT FEMUR WITHOUT CONTRAST  Technique:  Contiguous axial CT images were obtained to the left femur and multiplanar reformatting was performed.  Comparison: None.  Findings: Abnormal wall thickening is present in the urinary bladder with surrounding fluid in  the prevesical space, and mesenteric and presacral edema noted in the lower pelvis.  Edema tracks along the left common femoral vasculature.  The left external iliac, common femoral, and superficial femoral vein is significantly denser than the corresponding artery, raising suspicion for possible arterial occlusion.  Small left inguinal lymph nodes are present.  In addition to the perivascular edema, there is diffuse subcutaneous edema in the left thigh as well as edema tracking along the deep fascial margins, especially anteriorly.  A small knee effusion is present.  No significant abnormal osseous lesions observed.  IMPRESSION:  1. Possible arterial occlusion in the left thigh- correlate with distal pulses and consider Doppler assessment or CT angiogram. 2.  Diffuse bladder wall thickening may reflect cystitis.  There is surrounding fluid in the prevesical space.  There is also pelvic mesenteric edema and presacral edema. 3.  Diffuse subcutaneous edema is present in the left thigh, with abnormal edema tracking along the fascial planes, but without drainable abscess observed.  There is also perivascular edema along the deep venous structures and adjacent arterial structures.  Original Report Authenticated By: Dellia Cloud, M.D.   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  Bilateral lower extremity transcatheter thrombolytic therapy was begun on 01/27/2012.  1.  FOLLOW-UP LEFT LOWER EXTREMITY VENOGRAPHY ON FINAL DATE OF THROMBOLYTIC THERAPY 2.  FOLLOW-UP RIGHT LOWER EXTREMITY VENOGRAPHY ON FINAL DATE OF THROMBOLYTIC THERAPY 3.  VENOUS ANGIOPLASTY OF THE LEFT ILIAC VEINS AND LOWER IVC  Comparison:  01/27/2012 and 01/28/2012.  Sedation: 4.0 mg IV Versed; 200 mcg IV Fentanyl.  Total Moderate Sedation Time: 40 minutes.  Contrast:  110 ml Omnipaque-300  Fluoroscopy Time: 5.1 minutes.   Procedure:  The procedure, risks, benefits, and alternatives were explained to the patient.  Questions regarding the procedure were encouraged and answered.  The patient understands and consents to the procedure.  Both popliteal sheaths were prepped with Betadine and draped in a sterile fashion.  A sterile gown and sterile gloves were used for the procedure.  Local anesthesia was provided with 1% Lidocaine.  Venography was performed through the indwelling infusion catheters as well as through popliteal sheaths with full visualization of the veins of the lower extremities bilaterally, iliac veins and IVC.  Balloon angioplasty was performed at the level of the left common iliac vein and lower IVC with a 10 mm x 4 cm Conquest balloon. This was followed by angiography.  Further angioplasty  was then performed across the same segment with a 12 mm x 4 cm Mustang balloon.  Additional angiography was performed through a diagnostic catheter.  Upon completion, thrombolytic infusions were discontinued and infusion catheters and sheaths were removed.  Hemostasis was obtained at the level of bilateral popliteal venous access utilizing manual compression.  Complications: None  Findings: Compared with venography yesterday, there is further interval lysis of thrombus at the level of the femoral veins bilaterally and iliac veins.  Improved antegrade flow was also present in both lower extremities.  Improved flow was also noted at the level of the inferior vena cava and indwelling IVC filter.  There remained areas of irregular stenosis at the level of the left common iliac vein and lower IVC up to the IVC filter.  This has the appearance of chronic thrombus and chronic stenosis.  Balloon dilatation was performed resulting in some improved flow with residual stenosis remaining.  Given distribution of stenosis extending up into the IVC, it was felt that metallic stent placement would not be of long-term benefit.  The IVC filter also  should remain in place.  It is recommended that chronic anticoagulation be considered as the patient is at high risk for redevelopment of iliofemoral DVT without anticoagulation.  IMPRESSION: Improvement in patency of the femoral and iliac veins after continue thrombolytic therapy.  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  This was treated with 10 mm and 12 mm balloon angioplasty today.  Some degree of chronic narrowing remains present.  Recommend chronic anticoagulation.  Thrombolytic therapy was discontinued and bilateral popliteal venous access removed upon completion.  Original Report Authenticated By: Reola Calkins, M.D.   Ir US Guide Vasc Access Left  01/29/2012  *RADIOLOGY REPORT*  Clinical data:  Bilateral lower extremity DVT.  Previous IVC filter placement for pulmonary emboli.  BILATERAL LOWER EXTREMITY VENOGRAPHY INFERIOR VENA CAVOGRAPHY INITIATION OF CATHETER DIRECTED THROMBOLYSIS ULTRASOUND GUIDANCE FOR VENOUS ACCESS X2  Comparison:  CT 01/26/2012  Technique and findings: The procedure, risks (including but not limited to bleeding, infection, organ damage), benefits, and alternatives were explained to the patient.  Questions regarding the procedure were encouraged and answered.  The patient understands and consents to the procedure.The popliteal regions were prepped and draped in usual sterile fashion. Maximal barrier sterile technique was utilized including caps, mask, sterile gowns, sterile gloves, sterile drape, hand hygiene and skin antiseptic.  Intravenous Fentanyl and Versed were administered as conscious sedation during continuous cardiorespiratory monitoring by the radiology RN, with a total moderate sedation time of less than 30 minutes.  Skin was infiltrated locally with 1% lidocaine.  Under real time ultrasound guidance, the left popliteal vein was accessed with a 21- gauge micropuncture needle.  The vein was thrombosed.  There was no blood  return.  However, the 0.018 guide wire advanced easily, its position confirmed on fluoroscopy.  The needle was exchanged for a micropuncture dilator.  A small contrast injection confirmed appropriate intraluminal venous placement.  The dilator was exchanged over [facility] wire for a 6-French vascular sheath, through which a 5-French Kumpe catheter was advanced for left lower extremity venography and inferior vena cavography.  This demonstrated occlusive thrombus throughout the left femoropopliteal and  iliac venous system and in the infrarenal IVC up to the level of the IVC filter.  The catheter was exchanged over the guide wire for a 90-cm infusion catheter with 50 cm infusion length.  This was positioned spanning the iliofemoral thrombus.  In similar fashion, on the right a posterior tibial vein which was patent was accessed with a 21-gauge micropuncture needle.  The guide wire advanced centrally easily.  The transitional dilator was placed, allowed advancement of a Benson wire centrally.  A 5-French Kumpe catheter was advanced, for right lower extremity venography. This demonstrated patency of the popliteal vein with occlusive thrombus in the mid aspect of the femoral vein and extending centrally through the right iliac venous system.  The Kumpe catheter was exchanged for a 6-French vascular sheath, through which a 90-cm infusion catheter with 40 cm infusion length was placed, with infusion side holes spanning from above the IVC filter to the proximal femoral vein.  Transcatheter protocol tenecteplase infusion was initiated bilaterally.  The sheaths were secured to the skin with O-Prolene sutures and catheters secured to the sheaths with Steri-Strips, then covered with sterile dressing.  The patient transferred to the ICU. The patient tolerated the procedure well.  No immediate complication.  IMPRESSION: 1.  Extensive occlusive left DVT through the femoropopliteal and iliac venous systems. 2.  Extensive occlusive  right DVT   from the mid femoral vein through the iliac venous system. 3.  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 4.  Successful initiation of catheter directed bilateral venous thrombolytic infusion.  Plan is for follow-up venography 5/23.  Original Report Authenticated By: Osa Craver, M.D.   Ir US Guide Vasc Access Right  01/29/2012  *RADIOLOGY REPORT*  Clinical data:  Bilateral lower extremity DVT.  Previous IVC filter placement for pulmonary emboli.  BILATERAL LOWER EXTREMITY VENOGRAPHY INFERIOR VENA CAVOGRAPHY INITIATION OF CATHETER DIRECTED THROMBOLYSIS ULTRASOUND GUIDANCE FOR VENOUS ACCESS X2  Comparison:  CT 01/26/2012  Technique and findings: The procedure, risks (including but not limited to bleeding, infection, organ damage), benefits, and alternatives were explained to the patient.  Questions regarding the procedure were encouraged and answered.  The patient understands and consents to the procedure.The popliteal regions were prepped and draped in usual sterile fashion. Maximal barrier sterile technique was utilized including caps, mask, sterile gowns, sterile gloves, sterile drape, hand hygiene and skin antiseptic.  Intravenous Fentanyl and Versed were administered as conscious sedation during continuous cardiorespiratory monitoring by the radiology RN, with a total moderate sedation time of less than 30 minutes.  Skin was infiltrated locally with 1% lidocaine.  Under real time ultrasound guidance, the left popliteal vein was accessed with a 21- gauge micropuncture needle.  The vein was thrombosed.  There was no blood return.  However, the 0.018 guide wire advanced easily, its position confirmed on fluoroscopy.  The needle was exchanged for a micropuncture dilator.  A small contrast injection confirmed appropriate intraluminal venous placement.  The dilator was exchanged over [facility] wire for a 6-French vascular sheath, through which a 5-French Kumpe catheter was advanced  for left lower extremity venography and inferior vena cavography.  This demonstrated occlusive thrombus throughout the left femoropopliteal and  iliac venous system and in the infrarenal IVC up to the level of the IVC filter.  The catheter was exchanged over the guide wire for a 90-cm infusion catheter with 50 cm infusion length.  This was positioned spanning the iliofemoral thrombus.  In similar fashion, on the right a posterior tibial vein which was patent was accessed with a 21-gauge micropuncture needle.  The guide wire advanced centrally easily.  The transitional dilator was placed, allowed advancement of a Benson wire centrally.  A 5-French Kumpe catheter was advanced, for right lower  extremity venography. This demonstrated patency of the popliteal vein with occlusive thrombus in the mid aspect of the femoral vein and extending centrally through the right iliac venous system.  The Kumpe catheter was exchanged for a 6-French vascular sheath, through which a 90-cm infusion catheter with 40 cm infusion length was placed, with infusion side holes spanning from above the IVC filter to the proximal femoral vein.  Transcatheter protocol tenecteplase infusion was initiated bilaterally.  The sheaths were secured to the skin with O-Prolene sutures and catheters secured to the sheaths with Steri-Strips, then covered with sterile dressing.  The patient transferred to the ICU. The patient tolerated the procedure well.  No immediate complication.  IMPRESSION: 1.  Extensive occlusive left DVT through the femoropopliteal and iliac venous systems. 2.  Extensive occlusive right DVT   from the mid femoral vein through the iliac venous system. 3.  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 4.  Successful initiation of catheter directed bilateral venous thrombolytic infusion.  Plan is for follow-up venography 5/23.  Original Report Authenticated By: Osa Craver, M.D.   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  Bilateral lower extremity transcatheter thrombolytic therapy was begun on 01/27/2012.  1.  FOLLOW-UP LEFT LOWER EXTREMITY VENOGRAPHY ON FINAL DATE OF THROMBOLYTIC THERAPY 2.  FOLLOW-UP RIGHT LOWER EXTREMITY VENOGRAPHY ON FINAL DATE OF THROMBOLYTIC THERAPY 3.  VENOUS ANGIOPLASTY OF THE LEFT ILIAC VEINS AND LOWER IVC  Comparison:  01/27/2012 and 01/28/2012.  Sedation: 4.0 mg IV Versed; 200 mcg IV Fentanyl.  Total Moderate Sedation Time: 40 minutes.  Contrast:  110 ml Omnipaque-300  Fluoroscopy Time: 5.1 minutes.  Procedure:  The procedure, risks, benefits, and alternatives were explained to the patient.  Questions regarding the procedure were encouraged and answered.  The patient understands and consents to the procedure.  Both popliteal sheaths were prepped with Betadine and draped in a sterile fashion.  A sterile gown and sterile gloves were used for the procedure.  Local anesthesia was provided with 1% Lidocaine.  Venography was performed through the indwelling infusion catheters as well as through popliteal sheaths with full visualization of the veins of the lower extremities bilaterally, iliac veins and IVC.  Balloon angioplasty was performed at the level of the left common iliac vein and lower IVC with a 10 mm x 4 cm Conquest balloon. This was followed by angiography.  Further angioplasty was then performed across the same segment with a 12 mm x 4 cm Mustang balloon.  Additional angiography was performed through a diagnostic catheter.  Upon completion, thrombolytic infusions were discontinued and infusion catheters and sheaths were removed.  Hemostasis was obtained at the level of bilateral popliteal venous access utilizing manual compression.  Complications: None  Findings: Compared with venography yesterday, there is further interval lysis of thrombus at the  level of the femoral veins bilaterally and iliac veins.  Improved antegrade flow was also present in both lower extremities.  Improved flow was also noted at the level of the inferior vena cava and indwelling IVC filter.  There remained areas of irregular stenosis at the level of the left common iliac vein and lower IVC up to the IVC filter.  This has the appearance of chronic thrombus and chronic stenosis.  Balloon dilatation was performed resulting in some improved flow with residual stenosis remaining.  Given  distribution of stenosis extending up into the IVC, it was felt that metallic stent placement would not be of long-term benefit.  The IVC filter also should remain in place.  It is recommended that chronic anticoagulation be considered as the patient is at high risk for redevelopment of iliofemoral DVT without anticoagulation.  IMPRESSION: Improvement in patency of the femoral and iliac veins after continue thrombolytic therapy.  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  This was treated with 10 mm and 12 mm balloon angioplasty today.  Some degree of chronic narrowing remains present.  Recommend chronic anticoagulation.  Thrombolytic therapy was discontinued and bilateral popliteal venous access removed upon completion.  Original Report Authenticated By: Reola Calkins, M.D.   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  Bilateral lower extremity transcatheter thrombolytic therapy was begun on 01/27/2012.  1.  FOLLOW-UP LEFT LOWER EXTREMITY VENOGRAPHY ON FINAL DATE OF THROMBOLYTIC THERAPY 2.  FOLLOW-UP RIGHT LOWER EXTREMITY VENOGRAPHY ON FINAL DATE OF THROMBOLYTIC THERAPY 3.  VENOUS ANGIOPLASTY OF THE LEFT ILIAC VEINS AND LOWER IVC  Comparison:  01/27/2012 and 01/28/2012.  Sedation: 4.0 mg IV Versed; 200 mcg IV  Fentanyl.  Total Moderate Sedation Time: 40 minutes.  Contrast:  110 ml Omnipaque-300  Fluoroscopy Time: 5.1 minutes.  Procedure:  The procedure, risks, benefits, and alternatives were explained to the patient.  Questions regarding the procedure were encouraged and answered.  The patient understands and consents to the procedure.  Both popliteal sheaths were prepped with Betadine and draped in a sterile fashion.  A sterile gown and sterile gloves were used for the procedure.  Local anesthesia was provided with 1% Lidocaine.  Venography was performed through the indwelling infusion catheters as well as through popliteal sheaths with full visualization of the veins of the lower extremities bilaterally, iliac veins and IVC.  Balloon angioplasty was performed at the level of the left common iliac vein and lower IVC with a 10 mm x 4 cm Conquest balloon. This was followed by angiography.  Further angioplasty was then performed across the same segment with a 12 mm x 4 cm Mustang balloon.  Additional angiography was performed through a diagnostic catheter.  Upon completion, thrombolytic infusions were discontinued and infusion catheters and sheaths were removed.  Hemostasis was obtained at the level of bilateral popliteal venous access utilizing manual compression.  Complications: None  Findings: Compared with venography yesterday, there is further interval lysis of thrombus at the level of the femoral veins bilaterally and iliac veins.  Improved antegrade flow was also present in both lower extremities.  Improved flow was also noted at the level of the inferior vena cava and indwelling IVC filter.  There remained areas of irregular stenosis at the level of the left common iliac vein and lower IVC up to the IVC filter.  This has the appearance of chronic thrombus and chronic stenosis.  Balloon dilatation was performed resulting in some improved flow with residual stenosis remaining.  Given distribution of stenosis extending  up into the IVC, it was felt that metallic stent placement would not be of long-term benefit.  The IVC filter also should remain in place.  It is recommended that chronic anticoagulation be considered as the patient is at high risk for redevelopment of iliofemoral DVT without anticoagulation.  IMPRESSION: Improvement in patency of the  femoral and iliac veins after continue thrombolytic therapy.  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  This was treated with 10 mm and 12 mm balloon angioplasty today.  Some degree of chronic narrowing remains present.  Recommend chronic anticoagulation.  Thrombolytic therapy was discontinued and bilateral popliteal venous access removed upon completion.  Original Report Authenticated By: Reola Calkins, M.D.    Medications: I have reviewed the patient's current medications. Scheduled Meds:   . docusate sodium  100 mg Oral BID  . folic acid  1 mg Oral Daily  . mulitivitamin with minerals  1 tablet Oral Daily  . pantoprazole  40 mg Oral Q1200  . sodium chloride  3 mL Intravenous Q12H  . thiamine  100 mg Oral Daily   Or  . thiamine  100 mg Intravenous Daily  . warfarin  15 mg Oral Once  . warfarin   Does not apply Once  . Warfarin - Pharmacist Dosing Inpatient   Does not apply q1800   Continuous Infusions:   . heparin 2,500 Units/hr (02/03/12 0811)   PRN Meds:.sodium chloride, HYDROcodone-acetaminophen, LORazepam, midazolam, morphine injection, ondansetron (ZOFRAN) IV, ondansetron, sodium chloride Assessment/Plan: Patient Active Hospital Problem List: DVT of lower extremity, bilateral (01/28/2012)   Assessment: This patient had a history of pulmonary embolus in the past and then had a filter placed. 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 He currently still subtherapeutic on Coumadin and will continue on  heparin and Coumadin for bridging until therapeutic.    Hypokalemia (01/28/2012)   Assessment: Potassium within normal limits.    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

## 2012-02-04 DIAGNOSIS — Z590 Homelessness: Secondary | ICD-10-CM

## 2012-02-04 DIAGNOSIS — D649 Anemia, unspecified: Secondary | ICD-10-CM

## 2012-02-04 DIAGNOSIS — M79609 Pain in unspecified limb: Secondary | ICD-10-CM

## 2012-02-04 DIAGNOSIS — I749 Embolism and thrombosis of unspecified artery: Secondary | ICD-10-CM

## 2012-02-04 LAB — CBC
HCT: 30.9 % — ABNORMAL LOW (ref 39.0–52.0)
Hemoglobin: 9.9 g/dL — ABNORMAL LOW (ref 13.0–17.0)
MCHC: 32 g/dL (ref 30.0–36.0)
MCV: 102.3 fL — ABNORMAL HIGH (ref 78.0–100.0)

## 2012-02-04 LAB — PROTIME-INR: Prothrombin Time: 21.2 seconds — ABNORMAL HIGH (ref 11.6–15.2)

## 2012-02-04 MED ORDER — RIVAROXABAN 10 MG PO TABS: 20.0000 mg | ORAL_TABLET | Freq: Every day | ORAL | Status: DC

## 2012-02-04 MED ORDER — WARFARIN SODIUM 7.5 MG PO TABS
15.0000 mg | ORAL_TABLET | Freq: Once | ORAL | Status: AC
  Administered 2012-02-04: 15 mg via ORAL
  Filled 2012-02-04: qty 2

## 2012-02-04 MED ORDER — RIVAROXABAN 15 MG PO TABS
15.0000 mg | ORAL_TABLET | Freq: Two times a day (BID) | ORAL | Status: DC
  Administered 2012-02-04 – 2012-02-05 (×2): 15 mg via ORAL
  Filled 2012-02-04 (×5): qty 1

## 2012-02-04 NOTE — Progress Notes (Signed)
Physical Therapy Treatment Patient Details Name: [name] MRN: [id number] DOB: [birth year] Today's Date: 02/04/2012 Time: 6213-0865 PT Time Calculation (min): 22 min  PT Assessment / Plan / Recommendation Comments on Treatment Session  contnues to improve in gait Pt would benefit from straight cane at discharge for balance assist especailly when pt is fatigued    Follow Up Recommendations  No PT follow up    Barriers to Discharge        Equipment Recommendations  Cane    Recommendations for Other Services    Frequency     Plan Discharge plan remains appropriate    Precautions / Restrictions Precautions Precautions: Fall Restrictions Weight Bearing Restrictions: No LLE Weight Bearing: Weight bearing as tolerated   Pertinent Vitals/Pain No difficulties    Mobility  Bed Mobility Supine to Sit: 7: Independent Transfers Transfers: Sit to Stand;Stand to Sit Sit to Stand: 7: Independent Stand to Sit: 7: Independent Ambulation/Gait Ambulation/Gait Assistance: 6: Modified independent (Device/Increase time);7: Independent Ambulation Distance (Feet): 500 Feet Assistive device: Straight cane;None Ambulation/Gait Assistance Details: pt uses cane in left hand as needed for balance support in gait.  Gait Pattern: Step-through pattern;Within Functional Limits;Wide base of support Gait velocity: WFL General Gait Details: gait pattern is functional with occasional use of cane for balance support Stairs: No Wheelchair Mobility Wheelchair Mobility: No    Exercises     PT Diagnosis:    PT Problem List:   PT Treatment Interventions:     PT Goals Acute Rehab PT Goals PT Goal: Sit to Stand - Progress: Met PT Goal: Stand to Sit - Progress: Met PT Goal: Ambulate - Progress: Met  Visit Information  Last PT Received On: 02/04/12 Assistance Needed: +1    Subjective Data  Subjective: "That didn't take no time" Patient Stated Goal: to go home today or tomorrow   Cognition  Overall Cognitive Status: Appears within functional limits for tasks assessed/performed Arousal/Alertness: Awake/alert Orientation Level: Appears intact for tasks assessed Behavior During Session: [facility] for tasks performed    Balance  Balance Balance Assessed: No  End of Session PT - End of Session Activity Tolerance: Patient tolerated treatment well Patient left: in chair;with call bell/phone within reach Nurse Communication: Mobility status    Donnetta Hail 02/04/2012, 11:34 AM

## 2012-02-04 NOTE — Progress Notes (Signed)
ANTICOAGULATION CONSULT NOTE - Initial Consult  Pharmacy Consult for Xarelto Indication: DVT  No Known Allergies  Patient Measurements: Height: 6\' 2"  (188 cm) Weight: 176 lb 9.4 oz (80.1 kg) IBW/kg (Calculated) : 82.2   Vital Signs: Temp: 98.2 F (36.8 C) (05/30 1410) Temp src: Oral (05/30 1410) BP: 117/71 mmHg (05/30 1410) Pulse Rate: 76  (05/30 1410)  Labs:  Basename 02/04/12 0345 02/03/12 0330 02/02/12 0510  HGB 9.9* 9.6* --  HCT 30.9* 29.4* 31.6*  PLT 623* 586* 503*  APTT -- -- --  LABPROT 21.2* 16.4* 15.4*  INR 1.80* 1.30 1.19  HEPARINUNFRC 0.65 0.50 0.35  CREATININE -- 0.71 0.68  CKTOTAL -- -- --  CKMB -- -- --  TROPONINI -- -- --    Estimated Creatinine Clearance: 130.7 ml/min (by C-G formula based on Cr of 0.71).   Medical History: Past Medical History  Diagnosis Date  . Coronary artery disease   . Degenerative joint disease of spine   . Pulmonary embolism 06/2010  . Degenerative joint disease   . Lupus anticoagulant disorder 02/02/2012    Medications:  Scheduled:    . docusate sodium  100 mg Oral BID  . folic acid  1 mg Oral Daily  . mulitivitamin with minerals  1 tablet Oral Daily  . pantoprazole  40 mg Oral Q1200  . sodium chloride  3 mL Intravenous Q12H  . thiamine  100 mg Oral Daily   Or  . thiamine  100 mg Intravenous Daily  . warfarin  15 mg Oral Once  . warfarin   Does not apply Once  . DISCONTD: Warfarin - Pharmacist Dosing Inpatient   Does not apply q1800   Infusions:    . DISCONTD: heparin 2,500 Units/hr (02/04/12 1638)    Assessment: Received call from Dr. Ashley Royalty. Will switch patient from IV heparin/warfarin to Xarelto for bilateral DVT  Goal of Therapy:  Monitor renal function, CBC, drug interactions   Plan:  1. D/C IV heparin and warfarin and 1 hour later start Xarelto 2. Xarelto 15mg  PO BID with food x 3 weeks then 20mg  once daily 2. Ordered Xarelto care notes   Hessie Knows, PharmD, BCPS Pager  [phone] 02/04/2012 5:51 PM

## 2012-02-04 NOTE — Progress Notes (Signed)
Subjective: Patient presently has no complaints.  Interval history: Patient has demonstrated a history of significant noncompliance in the past. Due to his social situation he presents a significant medical risk of discharge while on Coumadin considering his noncompliance with seeking medical care and the need for ongoing surveillance with this medication. I have considered switching the patient to Xarelto however this may be cost prohibitive for her patient and his social circumstance. We will continue to pursue options for safely discharging this gentleman on anticoagulation.  Objective: Filed Vitals:   02/03/12 1907 02/03/12 2133 02/04/12 0611 02/04/12 1410  BP: 127/68 130/70 148/87 117/71  Pulse: 84 81 80 76  Temp: 98.8 F (37.1 C) 98 F (36.7 C) 98.6 F (37 C) 98.2 F (36.8 C)  TempSrc: Oral Oral Oral Oral  Resp: 16 18 20 20   Height:      Weight:      SpO2: 96% 94% 95% 97%   Weight change:   Intake/Output Summary (Last 24 hours) at 02/04/12 1828 Last data filed at 02/04/12 0700  Gross per 24 hour  Intake   1304 ml  Output   1500 ml  Net   -196 ml    General: Alert, awake, oriented x3, in no acute distress.  HEENT: [place]/AT [place], EOMI Neck: Trachea midline,  no masses, no thyromegal,y no JVD, no carotid bruit OROPHARYNX:  Moist, No exudate/ erythema/lesions.  Heart: Regular rate and rhythm, without murmurs, rubs, gallops, PMI non-displaced, no heaves or thrills on palpation.  Lungs: Clear to auscultation, no wheezing or rhonchi noted. No increased vocal fremitus resonant to percussion  Abdomen: Soft, nontender, nondistended, positive bowel sounds, no masses no hepatosplenomegaly noted..  Musculoskeletal: No warm swelling or erythema around joints, no spinal tenderness noted. Puncture sites on the skin show no evidence of infection.     Lab Results:  Basename 02/03/12 0330 02/02/12 0510  NA 139 139  K 3.8 3.9  CL 103 103  CO2 26 27  GLUCOSE 106* 98  BUN 5* 5*    CREATININE 0.71 0.68  CALCIUM 8.4 8.8  MG -- --  PHOS -- --   No results found for this basename: AST:2,ALT:2,ALKPHOS:2,BILITOT:2,PROT:2,ALBUMIN:2 in the last 72 hours No results found for this basename: LIPASE:2,AMYLASE:2 in the last 72 hours  Basename 02/04/12 0345 02/03/12 0330  WBC 10.2 10.0  NEUTROABS -- --  HGB 9.9* 9.6*  HCT 30.9* 29.4*  MCV 102.3* 102.1*  PLT 623* 586*   No results found for this basename: CKTOTAL:3,CKMB:3,CKMBINDEX:3,TROPONINI:3 in the last 72 hours No components found with this basename: POCBNP:3 No results found for this basename: DDIMER:2 in the last 72 hours No results found for this basename: HGBA1C:2 in the last 72 hours No results found for this basename: CHOL:2,HDL:2,LDLCALC:2,TRIG:2,CHOLHDL:2,LDLDIRECT:2 in the last 72 hours No results found for this basename: TSH,T4TOTAL,FREET3,T3FREE,THYROIDAB in the last 72 hours No results found for this basename: VITAMINB12:2,FOLATE:2,FERRITIN:2,TIBC:2,IRON:2,RETICCTPCT:2 in the last 72 hours  Micro Results: Recent Results (from the past 240 hour(s))  URINE CULTURE     Status: Normal   Collection Time   01/26/12 11:51 AM      Component Value Range Status Comment   Specimen Description URINE, RANDOM   Final    Special Requests NONE   Final    Culture  Setup Time [id number]   Final    Colony Count >=100,000 COLONIES/ML   Final    Culture     Final    Value: Multiple bacterial morphotypes present, none predominant. Suggest appropriate recollection  if clinically indicated.   Report Status 01/27/2012 FINAL   Final   MRSA PCR SCREENING     Status: Normal   Collection Time   01/27/12  6:19 PM      Component Value Range Status Comment   MRSA by PCR NEGATIVE  NEGATIVE  Final   URINE CULTURE     Status: Normal   Collection Time   01/28/12  4:45 PM      Component Value Range Status Comment   Specimen Description URINE, CLEAN CATCH   Final    Special Requests NONE   Final    Culture  Setup Time [id number]    Final    Colony Count NO GROWTH   Final    Culture NO GROWTH   Final    Report Status 01/29/2012 FINAL   Final     Studies/Results: Dg Chest 2 View  01/25/2012  *RADIOLOGY REPORT*  Clinical Data: Cough, dyspnea  CHEST - 2 VIEW  Comparison: 01/22/2012 CT of the chest  Findings: Cardiomediastinal silhouette is stable.  No acute infiltrate or pleural effusion.  No pulmonary edema.  Calcified granuloma left lower lobe is stable.  IMPRESSION: No active disease.  No significant change.  Original Report Authenticated By: Natasha Mead, M.D.   Ct Angio Chest W/cm &/or Wo Cm  01/22/2012  *RADIOLOGY REPORT*  Clinical Data: Shortness of breath, left leg pain, history of deep venous thrombosis, question pulmonary embolism  CT ANGIOGRAPHY CHEST  Technique:  Multidetector CT imaging of the chest using the standard protocol during bolus administration of intravenous contrast. Multiplanar reconstructed images including MIPs were obtained and reviewed to evaluate the vascular anatomy. Due to inadequate opacification of the pulmonary arterial system on initial exam, the patient was reinjected with contrast and the exam repeated.  Contrast: OMNIPAQUE IOHEXOL 300 MG/ML SOLN; 80 ml of Omnipaque 300  Comparison: 03/01/2011  Findings: Aorta normal caliber without aneurysm or dissection. No thoracic adenopathy. Pulmonary arteries suboptimally opacified but grossly patent. No definite evidence of pulmonary embolism. Visualized portion of upper abdomen normal appearance. Minimal linear scarring versus chronic atelectasis lateral right lower lobe, minimally nodular in appearance but unchanged since 03/01/2011. Large calcified granuloma superior segment left lower lobe. Additional scattered chronic lung changes. No definite pulmonary infiltrate, pleural effusion or pneumothorax. Osseous structures unremarkable.  IMPRESSION: No definite evidence of pulmonary embolism identified on exam limited by suboptimal pulmonary arterial  opacification, despite repeating exam. Few scattered chronic lung changes as above. No definite acute intrathoracic abnormality identified.  Original Report Authenticated By: Lollie Marrow, M.D.   Ir Veno/ext/bi  01/29/2012  *RADIOLOGY REPORT*  Clinical data:  Bilateral lower extremity DVT.  Previous IVC filter placement for pulmonary emboli.  BILATERAL LOWER EXTREMITY VENOGRAPHY INFERIOR VENA CAVOGRAPHY INITIATION OF CATHETER DIRECTED THROMBOLYSIS ULTRASOUND GUIDANCE FOR VENOUS ACCESS X2  Comparison:  CT 01/26/2012  Technique and findings: The procedure, risks (including but not limited to bleeding, infection, organ damage), benefits, and alternatives were explained to the patient.  Questions regarding the procedure were encouraged and answered.  The patient understands and consents to the procedure.The popliteal regions were prepped and draped in usual sterile fashion. Maximal barrier sterile technique was utilized including caps, mask, sterile gowns, sterile gloves, sterile drape, hand hygiene and skin antiseptic.  Intravenous Fentanyl and Versed were administered as conscious sedation during continuous cardiorespiratory monitoring by the radiology RN, with a total moderate sedation time of less than 30 minutes.  Skin was infiltrated locally with 1% lidocaine.  Under real  time ultrasound guidance, the left popliteal vein was accessed with a 21- gauge micropuncture needle.  The vein was thrombosed.  There was no blood return.  However, the 0.018 guide wire advanced easily, its position confirmed on fluoroscopy.  The needle was exchanged for a micropuncture dilator.  A small contrast injection confirmed appropriate intraluminal venous placement.  The dilator was exchanged over [facility] wire for a 6-French vascular sheath, through which a 5-French Kumpe catheter was advanced for left lower extremity venography and inferior vena cavography.  This demonstrated occlusive thrombus throughout the left femoropopliteal  and  iliac venous system and in the infrarenal IVC up to the level of the IVC filter.  The catheter was exchanged over the guide wire for a 90-cm infusion catheter with 50 cm infusion length.  This was positioned spanning the iliofemoral thrombus.  In similar fashion, on the right a posterior tibial vein which was patent was accessed with a 21-gauge micropuncture needle.  The guide wire advanced centrally easily.  The transitional dilator was placed, allowed advancement of a Benson wire centrally.  A 5-French Kumpe catheter was advanced, for right lower extremity venography. This demonstrated patency of the popliteal vein with occlusive thrombus in the mid aspect of the femoral vein and extending centrally through the right iliac venous system.  The Kumpe catheter was exchanged for a 6-French vascular sheath, through which a 90-cm infusion catheter with 40 cm infusion length was placed, with infusion side holes spanning from above the IVC filter to the proximal femoral vein.  Transcatheter protocol tenecteplase infusion was initiated bilaterally.  The sheaths were secured to the skin with O-Prolene sutures and catheters secured to the sheaths with Steri-Strips, then covered with sterile dressing.  The patient transferred to the ICU. The patient tolerated the procedure well.  No immediate complication.  IMPRESSION: 1.  Extensive occlusive left DVT through the femoropopliteal and iliac venous systems. 2.  Extensive occlusive right DVT   from the mid femoral vein through the iliac venous system. 3.  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 4.  Successful initiation of catheter directed bilateral venous thrombolytic infusion.  Plan is for follow-up venography 5/23.  Original Report Authenticated By: Osa Craver, M.D.   Ir Transcath/rx/inf Non Thrombolysis  01/29/2012  *RADIOLOGY REPORT*  Clinical data:  Bilateral lower extremity DVT.  Previous IVC filter placement for pulmonary emboli.   BILATERAL LOWER EXTREMITY VENOGRAPHY INFERIOR VENA CAVOGRAPHY INITIATION OF CATHETER DIRECTED THROMBOLYSIS ULTRASOUND GUIDANCE FOR VENOUS ACCESS X2  Comparison:  CT 01/26/2012  Technique and findings: The procedure, risks (including but not limited to bleeding, infection, organ damage), benefits, and alternatives were explained to the patient.  Questions regarding the procedure were encouraged and answered.  The patient understands and consents to the procedure.The popliteal regions were prepped and draped in usual sterile fashion. Maximal barrier sterile technique was utilized including caps, mask, sterile gowns, sterile gloves, sterile drape, hand hygiene and skin antiseptic.  Intravenous Fentanyl and Versed were administered as conscious sedation during continuous cardiorespiratory monitoring by the radiology RN, with a total moderate sedation time of less than 30 minutes.  Skin was infiltrated locally with 1% lidocaine.  Under real time ultrasound guidance, the left popliteal vein was accessed with a 21- gauge micropuncture needle.  The vein was thrombosed.  There was no blood return.  However, the 0.018 guide wire advanced easily, its position confirmed on fluoroscopy.  The needle was exchanged for a micropuncture dilator.  A small contrast  injection confirmed appropriate intraluminal venous placement.  The dilator was exchanged over [facility] wire for a 6-French vascular sheath, through which a 5-French Kumpe catheter was advanced for left lower extremity venography and inferior vena cavography.  This demonstrated occlusive thrombus throughout the left femoropopliteal and  iliac venous system and in the infrarenal IVC up to the level of the IVC filter.  The catheter was exchanged over the guide wire for a 90-cm infusion catheter with 50 cm infusion length.  This was positioned spanning the iliofemoral thrombus.  In similar fashion, on the right a posterior tibial vein which was patent was accessed with a 21-gauge  micropuncture needle.  The guide wire advanced centrally easily.  The transitional dilator was placed, allowed advancement of a Benson wire centrally.  A 5-French Kumpe catheter was advanced, for right lower extremity venography. This demonstrated patency of the popliteal vein with occlusive thrombus in the mid aspect of the femoral vein and extending centrally through the right iliac venous system.  The Kumpe catheter was exchanged for a 6-French vascular sheath, through which a 90-cm infusion catheter with 40 cm infusion length was placed, with infusion side holes spanning from above the IVC filter to the proximal femoral vein.  Transcatheter protocol tenecteplase infusion was initiated bilaterally.  The sheaths were secured to the skin with O-Prolene sutures and catheters secured to the sheaths with Steri-Strips, then covered with sterile dressing.  The patient transferred to the ICU. The patient tolerated the procedure well.  No immediate complication.  IMPRESSION: 1.  Extensive occlusive left DVT through the femoropopliteal and iliac venous systems. 2.  Extensive occlusive right DVT   from the mid femoral vein through the iliac venous system. 3.  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 4.  Successful initiation of catheter directed bilateral venous thrombolytic infusion.  Plan is for follow-up venography 5/23.  Original Report Authenticated By: Osa Craver, M.D.   Ir Transcath/rx/inf Non Thrombolysis  01/29/2012  *RADIOLOGY REPORT*  Clinical data:  Bilateral lower extremity DVT.  Previous IVC filter placement for pulmonary emboli.  BILATERAL LOWER EXTREMITY VENOGRAPHY INFERIOR VENA CAVOGRAPHY INITIATION OF CATHETER DIRECTED THROMBOLYSIS ULTRASOUND GUIDANCE FOR VENOUS ACCESS X2  Comparison:  CT 01/26/2012  Technique and findings: The procedure, risks (including but not limited to bleeding, infection, organ damage), benefits, and alternatives were explained to the patient.  Questions  regarding the procedure were encouraged and answered.  The patient understands and consents to the procedure.The popliteal regions were prepped and draped in usual sterile fashion. Maximal barrier sterile technique was utilized including caps, mask, sterile gowns, sterile gloves, sterile drape, hand hygiene and skin antiseptic.  Intravenous Fentanyl and Versed were administered as conscious sedation during continuous cardiorespiratory monitoring by the radiology RN, with a total moderate sedation time of less than 30 minutes.  Skin was infiltrated locally with 1% lidocaine.  Under real time ultrasound guidance, the left popliteal vein was accessed with a 21- gauge micropuncture needle.  The vein was thrombosed.  There was no blood return.  However, the 0.018 guide wire advanced easily, its position confirmed on fluoroscopy.  The needle was exchanged for a micropuncture dilator.  A small contrast injection confirmed appropriate intraluminal venous placement.  The dilator was exchanged over [facility] wire for a 6-French vascular sheath, through which a 5-French Kumpe catheter was advanced for left lower extremity venography and inferior vena cavography.  This demonstrated occlusive thrombus throughout the left femoropopliteal and  iliac venous system and in  the infrarenal IVC up to the level of the IVC filter.  The catheter was exchanged over the guide wire for a 90-cm infusion catheter with 50 cm infusion length.  This was positioned spanning the iliofemoral thrombus.  In similar fashion, on the right a posterior tibial vein which was patent was accessed with a 21-gauge micropuncture needle.  The guide wire advanced centrally easily.  The transitional dilator was placed, allowed advancement of a Benson wire centrally.  A 5-French Kumpe catheter was advanced, for right lower extremity venography. This demonstrated patency of the popliteal vein with occlusive thrombus in the mid aspect of the femoral vein and extending  centrally through the right iliac venous system.  The Kumpe catheter was exchanged for a 6-French vascular sheath, through which a 90-cm infusion catheter with 40 cm infusion length was placed, with infusion side holes spanning from above the IVC filter to the proximal femoral vein.  Transcatheter protocol tenecteplase infusion was initiated bilaterally.  The sheaths were secured to the skin with O-Prolene sutures and catheters secured to the sheaths with Steri-Strips, then covered with sterile dressing.  The patient transferred to the ICU. The patient tolerated the procedure well.  No immediate complication.  IMPRESSION: 1.  Extensive occlusive left DVT through the femoropopliteal and iliac venous systems. 2.  Extensive occlusive right DVT   from the mid femoral vein through the iliac venous system. 3.  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 4.  Successful initiation of catheter directed bilateral venous thrombolytic infusion.  Plan is for follow-up venography 5/23.  Original Report Authenticated By: Osa Craver, M.D.   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  Started thrombolytic therapy on 01/27/2012.  PROCEDURE(S): BILATERAL LOWER EXTREMITY ANGIOGRAPHY follow-up after thrombolytics  Physician: Rachelle Hora. Henn, MD  Fluoroscopy time: 1.4 minutes  Contrast:  70 ml Omnipaque-300  Procedure:The patient was placed prone on the interventional table. Contrast was injected through the side arm of the bilateral sheaths.  The right infusion catheter was slightly pulled back towards the IVC filter at the end of the procedure.  The patient was returned to intensive care unit and thrombolytic therapy was restarted.  Findings:Left lower extremity:  There is now flow within the left femoral vein, left common femoral vein and left iliac veins.  There is residual nonocclusive  thrombus in the lower left iliac vein just above the left common femoral vein.  There is residual narrowing and irregularity of the lower IVC most compatible with thrombus. The IVC is patent above the filter.  Right lower extremity:  There is antegrade flow in the left femoral vein and majority of the femoral thrombus has resolved.  Small amount of residual nonocclusive thrombus in the left iliac venous system.  Complications: None  Impression:The flow within the femoral veins and iliac veins have markedly improved.  Majority of the thrombus has resolved in the lower extremities but there is residual nonocclusive thrombus bilaterally.  Residual narrowing and irregularity of the IVC is most likely related to thrombus.  Plan:  Plan to continue tenecteplase infusions overnight.  Patient is scheduled to follow up in interventional radiology on 01/29/2012 for follow-up angiography.  Original Report Authenticated By: Richarda Overlie, M.D.   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  Started thrombolytic therapy on  01/27/2012.  PROCEDURE(S): BILATERAL LOWER EXTREMITY ANGIOGRAPHY follow-up after thrombolytics  Physician: Rachelle Hora. Henn, MD  Fluoroscopy time: 1.4 minutes  Contrast:  70 ml Omnipaque-300  Procedure:The patient was placed prone on the interventional table. Contrast was injected through the side arm of the bilateral sheaths.  The right infusion catheter was slightly pulled back towards the IVC filter at the end of the procedure.  The patient was returned to intensive care unit and thrombolytic therapy was restarted.  Findings:Left lower extremity:  There is now flow within the left femoral vein, left common femoral vein and left iliac veins.  There is residual nonocclusive thrombus in the lower left iliac vein just above the left common femoral vein.  There is residual narrowing and irregularity of  the lower IVC most compatible with thrombus. The IVC is patent above the filter.  Right lower extremity:  There is antegrade flow in the left femoral vein and majority of the femoral thrombus has resolved.  Small amount of residual nonocclusive thrombus in the left iliac venous system.  Complications: None  Impression:The flow within the femoral veins and iliac veins have markedly improved.  Majority of the thrombus has resolved in the lower extremities but there is residual nonocclusive thrombus bilaterally.  Residual narrowing and irregularity of the IVC is most likely related to thrombus.  Plan:  Plan to continue tenecteplase infusions overnight.  Patient is scheduled to follow up in interventional radiology on 01/29/2012 for follow-up angiography.  Original Report Authenticated By: Richarda Overlie, M.D.   Ct Angio Ao+bifem W/cm &/or Wo/cm  01/26/2012  *RADIOLOGY REPORT*  Clinical Data:  Left thigh edema  CT ANGIOGRAPHY OF ABDOMINAL AORTA WITH ILIOFEMORAL RUNOFF  Technique:  Multidetector CT imaging of the abdomen, pelvis and lower extremities was performed using the standard protocol during bolus administration of intravenous contrast.  Multiplanar CT image reconstructions including MIPs were obtained to evaluate the vascular anatomy.  Contrast: OMNIPAQUE IOHEXOL 350 MG/ML SOLN  Comparison:   None.  Findings:  Aorta is patent.  Celiac and SMA are patent.  Branch vessels of the celiac and SMA are grossly patent.  Single renal arteries are patent.  IMA is patent.  Right internal, external, and common iliac arteries are widely patent.  Right lower extremity runoff demonstrates patency of the right common femoral, superficial femoral, and profunda femoral arteries.  Right popliteal arteries patent.  Tibial vessels are grossly patent to the ankle for three-vessel runoff.  Left common femoral, superficial femoral, and profunda femoral arteries are patent.  Left popliteal artery is patent.  Tibial vessels are patent  grossly for three-vessel runoff.  There is extensive subcutaneous edema involving both lower extremities left greater than right.  Soft tissue edema is seen surrounding the lower abdomen and pelvis. There is also extensive edema within the intraperitoneal fat towards the pelvis.  IVC filter is in place.  There is felt to be at least some low density thrombus within the unopacified infrarenal IVC and within the filter.  The common iliac veins are both expanded and are surrounded by inflammatory changes.  These findings can be seen with DVT of the iliac venous system.  The study was not optimized or protocol for a venous evaluation therefore DVT cannot be excluded.  Diffuse bladder wall thickening.  Diffuse hepatic steatosis.  Kidneys, adrenal glands, spleen, pancreas are stable.  Gallbladder is stable.  Review of the MIP images confirms the above findings.  IMPRESSION: Aorta, iliac arteries, and lower extremity arteries are patent for  three-vessel runoff bilaterally.  IVC filter is in place.  There is felt to be at least some thrombus contained within the filter.  The iliac veins are expanded and surrounded by inflammatory changes.  DVT of the iliac venous system cannot be excluded.  The patient just underwent a contrast-enhanced study.  Initial evaluation should include a Doppler study of the lower extremities.  CT venogram of the IVC and pelvis can be performed for indeterminate findings.  Original Report Authenticated By: Donavan Burnet, M.D.   Ct Femur Left Wo Contrast  01/26/2012  *RADIOLOGY REPORT*  Clinical Data: Left thigh swelling, pain, edema, and erythema.  CT OF THE LEFT FEMUR WITHOUT CONTRAST  Technique:  Contiguous axial CT images were obtained to the left femur and multiplanar reformatting was performed.  Comparison: None.  Findings: Abnormal wall thickening is present in the urinary bladder with surrounding fluid in the prevesical space, and mesenteric and presacral edema noted in the lower pelvis.   Edema tracks along the left common femoral vasculature.  The left external iliac, common femoral, and superficial femoral vein is significantly denser than the corresponding artery, raising suspicion for possible arterial occlusion.  Small left inguinal lymph nodes are present.  In addition to the perivascular edema, there is diffuse subcutaneous edema in the left thigh as well as edema tracking along the deep fascial margins, especially anteriorly.  A small knee effusion is present.  No significant abnormal osseous lesions observed.  IMPRESSION:  1. Possible arterial occlusion in the left thigh- correlate with distal pulses and consider Doppler assessment or CT angiogram. 2.  Diffuse bladder wall thickening may reflect cystitis.  There is surrounding fluid in the prevesical space.  There is also pelvic mesenteric edema and presacral edema. 3.  Diffuse subcutaneous edema is present in the left thigh, with abnormal edema tracking along the fascial planes, but without drainable abscess observed.  There is also perivascular edema along the deep venous structures and adjacent arterial structures.  Original Report Authenticated By: Dellia Cloud, M.D.   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  Bilateral lower extremity transcatheter thrombolytic therapy was begun on 01/27/2012.  1.  FOLLOW-UP LEFT LOWER EXTREMITY VENOGRAPHY ON FINAL DATE OF THROMBOLYTIC THERAPY 2.  FOLLOW-UP RIGHT LOWER EXTREMITY VENOGRAPHY ON FINAL DATE OF THROMBOLYTIC THERAPY 3.  VENOUS ANGIOPLASTY OF THE LEFT ILIAC VEINS AND LOWER IVC  Comparison:  01/27/2012 and 01/28/2012.  Sedation: 4.0 mg IV Versed; 200 mcg IV Fentanyl.  Total Moderate Sedation Time: 40 minutes.  Contrast:  110 ml Omnipaque-300  Fluoroscopy Time: 5.1 minutes.  Procedure:  The procedure, risks, benefits, and alternatives were explained to the  patient.  Questions regarding the procedure were encouraged and answered.  The patient understands and consents to the procedure.  Both popliteal sheaths were prepped with Betadine and draped in a sterile fashion.  A sterile gown and sterile gloves were used for the procedure.  Local anesthesia was provided with 1% Lidocaine.  Venography was performed through the indwelling infusion catheters as well as through popliteal sheaths with full visualization of the veins of the lower extremities bilaterally, iliac veins and IVC.  Balloon angioplasty was performed at the level of the left common iliac vein and lower IVC with a 10 mm x 4 cm Conquest balloon. This was followed by angiography.  Further angioplasty was then performed across the same segment with  a 12 mm x 4 cm Mustang balloon.  Additional angiography was performed through a diagnostic catheter.  Upon completion, thrombolytic infusions were discontinued and infusion catheters and sheaths were removed.  Hemostasis was obtained at the level of bilateral popliteal venous access utilizing manual compression.  Complications: None  Findings: Compared with venography yesterday, there is further interval lysis of thrombus at the level of the femoral veins bilaterally and iliac veins.  Improved antegrade flow was also present in both lower extremities.  Improved flow was also noted at the level of the inferior vena cava and indwelling IVC filter.  There remained areas of irregular stenosis at the level of the left common iliac vein and lower IVC up to the IVC filter.  This has the appearance of chronic thrombus and chronic stenosis.  Balloon dilatation was performed resulting in some improved flow with residual stenosis remaining.  Given distribution of stenosis extending up into the IVC, it was felt that metallic stent placement would not be of long-term benefit.  The IVC filter also should remain in place.  It is recommended that chronic anticoagulation be considered  as the patient is at high risk for redevelopment of iliofemoral DVT without anticoagulation.  IMPRESSION: Improvement in patency of the femoral and iliac veins after continue thrombolytic therapy.  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  This was treated with 10 mm and 12 mm balloon angioplasty today.  Some degree of chronic narrowing remains present.  Recommend chronic anticoagulation.  Thrombolytic therapy was discontinued and bilateral popliteal venous access removed upon completion.  Original Report Authenticated By: Reola Calkins, M.D.   Ir US Guide Vasc Access Left  01/29/2012  *RADIOLOGY REPORT*  Clinical data:  Bilateral lower extremity DVT.  Previous IVC filter placement for pulmonary emboli.  BILATERAL LOWER EXTREMITY VENOGRAPHY INFERIOR VENA CAVOGRAPHY INITIATION OF CATHETER DIRECTED THROMBOLYSIS ULTRASOUND GUIDANCE FOR VENOUS ACCESS X2  Comparison:  CT 01/26/2012  Technique and findings: The procedure, risks (including but not limited to bleeding, infection, organ damage), benefits, and alternatives were explained to the patient.  Questions regarding the procedure were encouraged and answered.  The patient understands and consents to the procedure.The popliteal regions were prepped and draped in usual sterile fashion. Maximal barrier sterile technique was utilized including caps, mask, sterile gowns, sterile gloves, sterile drape, hand hygiene and skin antiseptic.  Intravenous Fentanyl and Versed were administered as conscious sedation during continuous cardiorespiratory monitoring by the radiology RN, with a total moderate sedation time of less than 30 minutes.  Skin was infiltrated locally with 1% lidocaine.  Under real time ultrasound guidance, the left popliteal vein was accessed with a 21- gauge micropuncture needle.  The vein was thrombosed.  There was no blood return.  However, the 0.018 guide wire advanced easily, its position confirmed on  fluoroscopy.  The needle was exchanged for a micropuncture dilator.  A small contrast injection confirmed appropriate intraluminal venous placement.  The dilator was exchanged over [facility] wire for a 6-French vascular sheath, through which a 5-French Kumpe catheter was advanced for left lower extremity venography and inferior vena cavography.  This demonstrated occlusive thrombus throughout the left femoropopliteal and  iliac venous system and in the infrarenal IVC up to the level of the IVC filter.  The catheter was exchanged over the guide wire for a 90-cm infusion catheter with 50 cm infusion length.  This was positioned spanning the iliofemoral thrombus.  In similar fashion, on the right a  posterior tibial vein which was patent was accessed with a 21-gauge micropuncture needle.  The guide wire advanced centrally easily.  The transitional dilator was placed, allowed advancement of a Benson wire centrally.  A 5-French Kumpe catheter was advanced, for right lower extremity venography. This demonstrated patency of the popliteal vein with occlusive thrombus in the mid aspect of the femoral vein and extending centrally through the right iliac venous system.  The Kumpe catheter was exchanged for a 6-French vascular sheath, through which a 90-cm infusion catheter with 40 cm infusion length was placed, with infusion side holes spanning from above the IVC filter to the proximal femoral vein.  Transcatheter protocol tenecteplase infusion was initiated bilaterally.  The sheaths were secured to the skin with O-Prolene sutures and catheters secured to the sheaths with Steri-Strips, then covered with sterile dressing.  The patient transferred to the ICU. The patient tolerated the procedure well.  No immediate complication.  IMPRESSION: 1.  Extensive occlusive left DVT through the femoropopliteal and iliac venous systems. 2.  Extensive occlusive right DVT   from the mid femoral vein through the iliac venous system. 3.  Occlusive  caval thrombus to the level of the IVC filter. 4.  Successful initiation of catheter directed bilateral venous thrombolytic infusion.  Plan is for follow-up venography 5/23.  Original Report Authenticated By: Osa Craver, M.D.   Ir US Guide Vasc Access Right  01/29/2012  *RADIOLOGY REPORT*  Clinical data:  Bilateral lower extremity DVT.  Previous IVC filter placement for pulmonary emboli.  BILATERAL LOWER EXTREMITY VENOGRAPHY INFERIOR VENA CAVOGRAPHY INITIATION OF CATHETER DIRECTED THROMBOLYSIS ULTRASOUND GUIDANCE FOR VENOUS ACCESS X2  Comparison:  CT 01/26/2012  Technique and findings: The procedure, risks (including but not limited to bleeding, infection, organ damage), benefits, and alternatives were explained to the patient.  Questions regarding the procedure were encouraged and answered.  The patient understands and consents to the procedure.The popliteal regions were prepped and draped in usual sterile fashion. Maximal barrier sterile technique was utilized including caps, mask, sterile gowns, sterile gloves, sterile drape, hand hygiene and skin antiseptic.  Intravenous Fentanyl and Versed were administered as conscious sedation during continuous cardiorespiratory monitoring by the radiology RN, with a total moderate sedation time of less than 30 minutes.  Skin was infiltrated locally with 1% lidocaine.  Under real time ultrasound guidance, the left popliteal vein was accessed with a 21- gauge micropuncture needle.  The vein was thrombosed.  There was no blood return.  However, the 0.018 guide wire advanced easily, its position confirmed on fluoroscopy.  The needle was exchanged for a micropuncture dilator.  A small contrast injection confirmed appropriate intraluminal venous placement.  The dilator was exchanged over [facility] wire for a 6-French vascular sheath, through which a 5-French Kumpe catheter was advanced for left lower extremity venography and inferior vena cavography.  This demonstrated  occlusive thrombus throughout the left femoropopliteal and  iliac venous system and in the infrarenal IVC up to the level of the IVC filter.  The catheter was exchanged over the guide wire for a 90-cm infusion catheter with 50 cm infusion length.  This was positioned spanning the iliofemoral thrombus.  In similar fashion, on the right a posterior tibial vein which was patent was accessed with a 21-gauge micropuncture needle.  The guide wire advanced centrally easily.  The transitional dilator was placed, allowed advancement of a Benson wire centrally.  A 5-French Kumpe catheter was advanced, for right lower extremity venography. This demonstrated patency of the  popliteal vein with occlusive thrombus in the mid aspect of the femoral vein and extending centrally through the right iliac venous system.  The Kumpe catheter was exchanged for a 6-French vascular sheath, through which a 90-cm infusion catheter with 40 cm infusion length was placed, with infusion side holes spanning from above the IVC filter to the proximal femoral vein.  Transcatheter protocol tenecteplase infusion was initiated bilaterally.  The sheaths were secured to the skin with O-Prolene sutures and catheters secured to the sheaths with Steri-Strips, then covered with sterile dressing.  The patient transferred to the ICU. The patient tolerated the procedure well.  No immediate complication.  IMPRESSION: 1.  Extensive occlusive left DVT through the femoropopliteal and iliac venous systems. 2.  Extensive occlusive right DVT   from the mid femoral vein through the iliac venous system. 3.  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 4.  Successful initiation of catheter directed bilateral venous thrombolytic infusion.  Plan is for follow-up venography 5/23.  Original Report Authenticated By: Osa Craver, M.D.   Ir Rande Lawman F/u Eval Art/ven Final Day (ms)  01/29/2012  *RADIOLOGY REPORT*  Clinical Data: Extensive bilateral  iliofemoral DVT and caval thrombosis at the level of an indwelling IVC filter.  Bilateral lower extremity transcatheter thrombolytic therapy was begun on 01/27/2012.  1.  FOLLOW-UP LEFT LOWER EXTREMITY VENOGRAPHY ON FINAL DATE OF THROMBOLYTIC THERAPY 2.  FOLLOW-UP RIGHT LOWER EXTREMITY VENOGRAPHY ON FINAL DATE OF THROMBOLYTIC THERAPY 3.  VENOUS ANGIOPLASTY OF THE LEFT ILIAC VEINS AND LOWER IVC  Comparison:  01/27/2012 and 01/28/2012.  Sedation: 4.0 mg IV Versed; 200 mcg IV Fentanyl.  Total Moderate Sedation Time: 40 minutes.  Contrast:  110 ml Omnipaque-300  Fluoroscopy Time: 5.1 minutes.  Procedure:  The procedure, risks, benefits, and alternatives were explained to the patient.  Questions regarding the procedure were encouraged and answered.  The patient understands and consents to the procedure.  Both popliteal sheaths were prepped with Betadine and draped in a sterile fashion.  A sterile gown and sterile gloves were used for the procedure.  Local anesthesia was provided with 1% Lidocaine.  Venography was performed through the indwelling infusion catheters as well as through popliteal sheaths with full visualization of the veins of the lower extremities bilaterally, iliac veins and IVC.  Balloon angioplasty was performed at the level of the left common iliac vein and lower IVC with a 10 mm x 4 cm Conquest balloon. This was followed by angiography.  Further angioplasty was then performed across the same segment with a 12 mm x 4 cm Mustang balloon.  Additional angiography was performed through a diagnostic catheter.  Upon completion, thrombolytic infusions were discontinued and infusion catheters and sheaths were removed.  Hemostasis was obtained at the level of bilateral popliteal venous access utilizing manual compression.  Complications: None  Findings: Compared with venography yesterday, there is further interval lysis of thrombus at the level of the femoral veins bilaterally and iliac veins.  Improved  antegrade flow was also present in both lower extremities.  Improved flow was also noted at the level of the inferior vena cava and indwelling IVC filter.  There remained areas of irregular stenosis at the level of the left common iliac vein and lower IVC up to the IVC filter.  This has the appearance of chronic thrombus and chronic stenosis.  Balloon dilatation was performed resulting in some improved flow with residual stenosis remaining.  Given distribution of stenosis extending up into the  IVC, it was felt that metallic stent placement would not be of long-term benefit.  The IVC filter also should remain in place.  It is recommended that chronic anticoagulation be considered as the patient is at high risk for redevelopment of iliofemoral DVT without anticoagulation.  IMPRESSION: Improvement in patency of the femoral and iliac veins after continue thrombolytic therapy.  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  This was treated with 10 mm and 12 mm balloon angioplasty today.  Some degree of chronic narrowing remains present.  Recommend chronic anticoagulation.  Thrombolytic therapy was discontinued and bilateral popliteal venous access removed upon completion.  Original Report Authenticated By: Reola Calkins, M.D.   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  Bilateral lower extremity transcatheter thrombolytic therapy was begun on 01/27/2012.  1.  FOLLOW-UP LEFT LOWER EXTREMITY VENOGRAPHY ON FINAL DATE OF THROMBOLYTIC THERAPY 2.  FOLLOW-UP RIGHT LOWER EXTREMITY VENOGRAPHY ON FINAL DATE OF THROMBOLYTIC THERAPY 3.  VENOUS ANGIOPLASTY OF THE LEFT ILIAC VEINS AND LOWER IVC  Comparison:  01/27/2012 and 01/28/2012.  Sedation: 4.0 mg IV Versed; 200 mcg IV Fentanyl.  Total Moderate Sedation Time: 40 minutes.  Contrast:  110 ml  Omnipaque-300  Fluoroscopy Time: 5.1 minutes.  Procedure:  The procedure, risks, benefits, and alternatives were explained to the patient.  Questions regarding the procedure were encouraged and answered.  The patient understands and consents to the procedure.  Both popliteal sheaths were prepped with Betadine and draped in a sterile fashion.  A sterile gown and sterile gloves were used for the procedure.  Local anesthesia was provided with 1% Lidocaine.  Venography was performed through the indwelling infusion catheters as well as through popliteal sheaths with full visualization of the veins of the lower extremities bilaterally, iliac veins and IVC.  Balloon angioplasty was performed at the level of the left common iliac vein and lower IVC with a 10 mm x 4 cm Conquest balloon. This was followed by angiography.  Further angioplasty was then performed across the same segment with a 12 mm x 4 cm Mustang balloon.  Additional angiography was performed through a diagnostic catheter.  Upon completion, thrombolytic infusions were discontinued and infusion catheters and sheaths were removed.  Hemostasis was obtained at the level of bilateral popliteal venous access utilizing manual compression.  Complications: None  Findings: Compared with venography yesterday, there is further interval lysis of thrombus at the level of the femoral veins bilaterally and iliac veins.  Improved antegrade flow was also present in both lower extremities.  Improved flow was also noted at the level of the inferior vena cava and indwelling IVC filter.  There remained areas of irregular stenosis at the level of the left common iliac vein and lower IVC up to the IVC filter.  This has the appearance of chronic thrombus and chronic stenosis.  Balloon dilatation was performed resulting in some improved flow with residual stenosis remaining.  Given distribution of stenosis extending up into the IVC, it was felt that metallic stent placement would not be  of long-term benefit.  The IVC filter also should remain in place.  It is recommended that chronic anticoagulation be considered as the patient is at high risk for redevelopment of iliofemoral DVT without anticoagulation.  IMPRESSION: Improvement in patency of the femoral and iliac veins after continue thrombolytic  therapy.  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  This was treated with 10 mm and 12 mm balloon angioplasty today.  Some degree of chronic narrowing remains present.  Recommend chronic anticoagulation.  Thrombolytic therapy was discontinued and bilateral popliteal venous access removed upon completion.  Original Report Authenticated By: Reola Calkins, M.D.    Medications: I have reviewed the patient's current medications. Scheduled Meds:    . docusate sodium  100 mg Oral BID  . folic acid  1 mg Oral Daily  . mulitivitamin with minerals  1 tablet Oral Daily  . pantoprazole  40 mg Oral Q1200  . rivaroxaban  20 mg Oral Daily  . rivaroxaban  15 mg Oral BID WC  . sodium chloride  3 mL Intravenous Q12H  . thiamine  100 mg Oral Daily   Or  . thiamine  100 mg Intravenous Daily  . warfarin  15 mg Oral Once  . warfarin   Does not apply Once  . DISCONTD: Warfarin - Pharmacist Dosing Inpatient   Does not apply q1800   Continuous Infusions:    . DISCONTD: heparin 2,500 Units/hr (02/04/12 1638)   PRN Meds:.sodium chloride, HYDROcodone-acetaminophen, LORazepam, midazolam, morphine injection, ondansetron (ZOFRAN) IV, ondansetron, sodium chloride Assessment/Plan: Patient Active Hospital Problem List: DVT of lower extremity, bilateral (01/28/2012)   Assessment: This patient had a history of pulmonary embolus in the past and then had a filter placed. 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 He currently still subtherapeutic on Coumadin and  will continue on heparin and Coumadin for bridging until therapeutic.    Hypokalemia (01/28/2012)   Assessment: Potassium within normal limits.    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

## 2012-02-04 NOTE — Progress Notes (Signed)
ANTICOAGULATION CONSULT NOTE - Follow Up Consult  Pharmacy Consult for IV heparin/warfarin Indication: Bilateral DVT  No Known Allergies  Patient Measurements: Height: 6\' 2"  (188 cm) Weight: 176 lb 9.4 oz (80.1 kg) IBW/kg (Calculated) : 82.2   Vital Signs: Temp: 98.6 F (37 C) (05/30 0611) Temp src: Oral (05/30 0611) BP: 148/87 mmHg (05/30 0611) Pulse Rate: 80  (05/30 0611)  Labs:  Basename 02/04/12 0345 02/03/12 0330 02/02/12 0510  HGB 9.9* 9.6* --  HCT 30.9* 29.4* 31.6*  PLT 623* 586* 503*  APTT -- -- --  LABPROT 21.2* 16.4* 15.4*  INR 1.80* 1.30 1.19  HEPARINUNFRC 0.65 0.50 0.35  CREATININE -- 0.71 0.68  CKTOTAL -- -- --  CKMB -- -- --  TROPONINI -- -- --    Estimated Creatinine Clearance: 130.7 ml/min (by C-G formula based on Cr of 0.71).  Assessment:  46 YO homeless male with h/o PE placed on Coumadin in the past but had not taken x 6 months PTA. Patient has an IVC filter. Patient presented 5/17 with L ankle pain and swelling. LLE venous duplex at the time negative for DVT (+ for superficial thrombosis).  Patient returned 5/20 with similar complaints and preliminary BLE venous duplex + for bilateral DVT. Lovenox started for VTE treatment. On 5/21 - arterial occlusion noted Lovenox changed to IV heparin.  IR started tenecteplase infusion (2sites) on 5/22 and off on 5/24.   Venogram 5/23 with improved flow in BLE and 5/24 venogram with thrombus largely cleared/lysed.  Received new orders from Radiologist to resume heparin to achieve normal therapeutic goal now off of TNKase.  CBC stable  No reported bleeding.  Heparin level therapeutic.  INR rising slowly, INR is almost therapeutic.  Has completed miniumum of 5 days overlap but per CHEST guidelines, need to also have therapeutic INR x 24hrs before stopping heparin.   Goal of therapy: INR 2-3 Heparin level 0.3-0.7 units/ml Monitor platelets by anticoagulation protocol: Yes   Plan:   Continue IV heparin  @ 2500 units/hr.  Give Coumadin 15mg  today.  Monitor daily CBC, INR and heparin level.  Gwen Her PharmD  [phone] 02/04/2012 8:48 AM

## 2012-02-04 NOTE — Progress Notes (Signed)
CSW contact several agencies regarding pt placement. The following places were contacted: [facility], [facility], [facility], [facility], [facility], [facility], [facility], [facility], [facility], and several others, who replied no and that they were unable to provide services.  CSW was informed by the facilities contacted that the patient did not meet medical need, the facility had no male beds, and/or that the pt could not be accepted medicaid pending due to facility being private pay. CSW was also informed that facilities had an age restriction, such as to 9 and above, senior only or that the facility was unable to accommodate patient's pay source. CSW will continue to search out options prior to pt DC. CSW also consulted with the pt and the patient express no interest in going to a homeless shelter. He would rather camp out in the woods again if all else fails.   [name]. [name] [phone]

## 2012-02-05 ENCOUNTER — Other Ambulatory Visit: Payer: Self-pay | Admitting: Oncology

## 2012-02-05 ENCOUNTER — Telehealth: Payer: Self-pay | Admitting: *Deleted

## 2012-02-05 DIAGNOSIS — I82403 Acute embolism and thrombosis of unspecified deep veins of lower extremity, bilateral: Secondary | ICD-10-CM

## 2012-02-05 LAB — CBC
HCT: 30.8 % — ABNORMAL LOW (ref 39.0–52.0)
Hemoglobin: 9.8 g/dL — ABNORMAL LOW (ref 13.0–17.0)
MCH: 32.8 pg (ref 26.0–34.0)
RBC: 2.99 MIL/uL — ABNORMAL LOW (ref 4.22–5.81)

## 2012-02-05 LAB — PROTIME-INR
INR: 4.17 — ABNORMAL HIGH (ref 0.00–1.49)
Prothrombin Time: 40.9 seconds — ABNORMAL HIGH (ref 11.6–15.2)

## 2012-02-05 MED ORDER — DSS 100 MG PO CAPS: 100.0000 mg | ORAL_CAPSULE | Freq: Two times a day (BID) | ORAL | Status: AC

## 2012-02-05 MED ORDER — FOLIC ACID 1 MG PO TABS: 1.0000 mg | ORAL_TABLET | Freq: Every day | ORAL | Status: DC

## 2012-02-05 MED ORDER — PANTOPRAZOLE SODIUM 40 MG PO TBEC: 40.0000 mg | DELAYED_RELEASE_TABLET | Freq: Every day | ORAL | Status: DC

## 2012-02-05 MED ORDER — RIVAROXABAN 15 MG PO TABS: 15.0000 mg | ORAL_TABLET | Freq: Two times a day (BID) | ORAL | Status: DC

## 2012-02-05 MED ORDER — RIVAROXABAN 20 MG PO TABS: 20.0000 mg | ORAL_TABLET | Freq: Every day | ORAL | Status: DC

## 2012-02-05 MED ORDER — THIAMINE HCL 100 MG PO TABS: 100.0000 mg | ORAL_TABLET | Freq: Every day | ORAL | Status: DC

## 2012-02-05 MED ORDER — HYDROCODONE-ACETAMINOPHEN 5-325 MG PO TABS: 1.0000 | ORAL_TABLET | ORAL | Status: AC | PRN

## 2012-02-05 MED ORDER — ADULT MULTIVITAMIN W/MINERALS CH: 1.0000 | ORAL_TABLET | Freq: Every day | ORAL | Status: DC

## 2012-02-05 NOTE — Telephone Encounter (Signed)
Rec'd call from Merlyn Albert, RN on the floor that patient was being discharged today and Dr Marthann Schiller is requesting follow up with Dr Gaylyn Rong. Patient is to be discharged to [facility], [phone], transport contact Thayer Ohm [phone]. We will arrange follow up and call to confirm appts. Orders to scheduling.

## 2012-02-05 NOTE — Progress Notes (Signed)
Clinical Social Work Department BRIEF PSYCHOSOCIAL ASSESSMENT 02/05/2012  Patient:  [name],[name]     Account Number:  [id number]     Admit date:  01/25/2012  Clinical Social Worker:  Doroteo Glassman  Date/Time:  01/26/2012 04:05 PM  Referred by:  Physician  Date Referred:  01/26/2012 Referred for  Homelessness   Other Referral:   May need SA referral   Interview type:  Patient Other interview type:    PSYCHOSOCIAL DATA Living Status:  OTHER Admitted from facility:   Level of care:   Primary support name:  None Primary support relationship to patient:   Degree of support available:   poor--no family    CURRENT CONCERNS Current Concerns  Other - See comment   Other Concerns:   Pt is homeless and has SA issues; he drinks "no less than 2 12-packs per day."    SOCIAL WORK ASSESSMENT / PLAN CSW to assist Pt with SA tx, securing a shelter and other SW needs, as they arise.   Assessment/plan status:  Psychosocial Support/Ongoing Assessment of Needs Other assessment/ plan:   Information/referral to community resources:   CSW to provide Pt with shelter and SA tx information.    PATIENT'S/FAMILY'S RESPONSE TO PLAN OF CARE: Pt thanked CSW for time and assistance.        Kayleen Memos. Leighton Ruff [phone]

## 2012-02-05 NOTE — Progress Notes (Signed)
Clinical Social Work Department CLINICAL SOCIAL WORK PLACEMENT NOTE 02/05/2012  Patient:  CY, BRESEE  Account Number:  [id number] Admit date:  01/25/2012  Clinical Social Worker:  Tommi Emery, CLINICAL SOCIAL WORKER  Date/time:  02/05/2012 03:37 PM  Clinical Social Work is seeking post-discharge placement for this patient at the following level of care:   SKILLED NURSING   (*CSW will update this form in Epic as items are completed)   02/01/2012  Patient/family provided with [facility] Department of Clinical Social Work's list of facilities offering this level of care within the geographic area requested by the patient (or if unable, by the patient's family).  02/01/2012  Patient/family informed of their freedom to choose among providers that offer the needed level of care, that participate in Medicare, Medicaid or managed care program needed by the patient, have an available bed and are willing to accept the patient.  02/01/2012  Patient/family informed of [facility]' ownership interest in [facility], as well as of the fact that they are under no obligation to receive care at this facility.  PASARR submitted to [employer] on 02/01/2012 PASARR number received from [employer] on 02/01/2012  FL2 transmitted to all facilities in geographic area requested by pt/family on  02/01/2012 FL2 transmitted to all facilities within larger geographic area on 02/01/2012  Patient informed that his/her managed care company has contracts with or will negotiate with  certain facilities, including the following:     Patient/family informed of bed offers received:  02/04/2012 Patient chooses bed at [facility] Physician recommends and patient chooses bed at  [facility]  Patient to be transferred to [facility] on  02/05/2012 Patient to be transferred to facility by [facility]  The following physician request were entered in Epic:   Additional Comments: pt trasfered with LOG and medicaid  pending  Nevia Henkin C. Leighton Ruff [phone]

## 2012-02-05 NOTE — Progress Notes (Signed)
Pt for D/C today to  [facility]. Plan transport via EMS. Pt and family are agreeable to plans.  Kayleen Memos. Leighton Ruff [phone]

## 2012-02-05 NOTE — Discharge Summary (Signed)
[name] MRN: [id number] DOB/AGE: 10-03-1965 46 y.o.  Admit date: 01/25/2012 Discharge date: 02/05/2012  Primary Care Physician:  No primary provider on file.   Discharge Diagnoses:   Patient Active Problem List  Diagnoses  . Alcohol abuse  . Tobacco abuse  . Degenerative joint disease  . Hypokalemia  . Hyponatremia  . DVT of lower extremity, bilateral  . Anemia  . Lupus anticoagulant disorder    DISCHARGE MEDICATION: Medication List  As of 02/05/2012 12:35 PM   STOP taking these medications         ibuprofen 200 MG tablet      naproxen 500 MG tablet      traMADol 50 MG tablet         TAKE these medications         DSS 100 MG Caps   Take 100 mg by mouth 2 (two) times daily.      folic acid 1 MG tablet   Commonly known as: FOLVITE   Take 1 tablet (1 mg total) by mouth daily.      HYDROcodone-acetaminophen 5-325 MG per tablet   Commonly known as: NORCO   Take 1-2 tablets by mouth every 4 (four) hours as needed.      mulitivitamin with minerals Tabs   Take 1 tablet by mouth daily.      pantoprazole 40 MG tablet   Commonly known as: PROTONIX   Take 1 tablet (40 mg total) by mouth daily at 12 noon.      Rivaroxaban 20 MG Tabs   Take 20 mg by mouth daily. Starting 02/15/2012      Rivaroxaban 15 MG Tabs tablet   Commonly known as: XARELTO   Take 1 tablet (15 mg total) by mouth 2 (two) times daily with a meal. Until 02/14/2012      thiamine 100 MG tablet   Take 1 tablet (100 mg total) by mouth daily.              Consults:     SIGNIFICANT DIAGNOSTIC STUDIES:  Dg Chest 2 View  01/25/2012  *RADIOLOGY REPORT*  Clinical Data: Cough, dyspnea  CHEST - 2 VIEW  Comparison: 01/22/2012 CT of the chest  Findings: Cardiomediastinal silhouette is stable.  No acute infiltrate or pleural effusion.  No pulmonary edema.  Calcified granuloma left lower lobe is stable.  IMPRESSION: No active disease.  No significant change.  Original Report Authenticated By: Natasha Mead,  M.D.   Ct Angio Chest W/cm &/or Wo Cm  01/22/2012  *RADIOLOGY REPORT*  Clinical Data: Shortness of breath, left leg pain, history of deep venous thrombosis, question pulmonary embolism  CT ANGIOGRAPHY CHEST  Technique:  Multidetector CT imaging of the chest using the standard protocol during bolus administration of intravenous contrast. Multiplanar reconstructed images including MIPs were obtained and reviewed to evaluate the vascular anatomy. Due to inadequate opacification of the pulmonary arterial system on initial exam, the patient was reinjected with contrast and the exam repeated.  Contrast: OMNIPAQUE IOHEXOL 300 MG/ML SOLN; 80 ml of Omnipaque 300  Comparison: 03/01/2011  Findings: Aorta normal caliber without aneurysm or dissection. No thoracic adenopathy. Pulmonary arteries suboptimally opacified but grossly patent. No definite evidence of pulmonary embolism. Visualized portion of upper abdomen normal appearance. Minimal linear scarring versus chronic atelectasis lateral right lower lobe, minimally nodular in appearance but unchanged since 03/01/2011. Large calcified granuloma superior segment left lower lobe. Additional scattered chronic lung changes. No definite pulmonary infiltrate, pleural effusion or pneumothorax. Osseous structures  unremarkable.  IMPRESSION: No definite evidence of pulmonary embolism identified on exam limited by suboptimal pulmonary arterial opacification, despite repeating exam. Few scattered chronic lung changes as above. No definite acute intrathoracic abnormality identified.  Original Report Authenticated By: Lollie Marrow, M.D.   Ir Veno/ext/bi  01/29/2012  *RADIOLOGY REPORT*  Clinical data:  Bilateral lower extremity DVT.  Previous IVC filter placement for pulmonary emboli.  BILATERAL LOWER EXTREMITY VENOGRAPHY INFERIOR VENA CAVOGRAPHY INITIATION OF CATHETER DIRECTED THROMBOLYSIS ULTRASOUND GUIDANCE FOR VENOUS ACCESS X2  Comparison:  CT 01/26/2012  Technique and  findings: The procedure, risks (including but not limited to bleeding, infection, organ damage), benefits, and alternatives were explained to the patient.  Questions regarding the procedure were encouraged and answered.  The patient understands and consents to the procedure.The popliteal regions were prepped and draped in usual sterile fashion. Maximal barrier sterile technique was utilized including caps, mask, sterile gowns, sterile gloves, sterile drape, hand hygiene and skin antiseptic.  Intravenous Fentanyl and Versed were administered as conscious sedation during continuous cardiorespiratory monitoring by the radiology RN, with a total moderate sedation time of less than 30 minutes.  Skin was infiltrated locally with 1% lidocaine.  Under real time ultrasound guidance, the left popliteal vein was accessed with a 21- gauge micropuncture needle.  The vein was thrombosed.  There was no blood return.  However, the 0.018 guide wire advanced easily, its position confirmed on fluoroscopy.  The needle was exchanged for a micropuncture dilator.  A small contrast injection confirmed appropriate intraluminal venous placement.  The dilator was exchanged over [facility] wire for a 6-French vascular sheath, through which a 5-French Kumpe catheter was advanced for left lower extremity venography and inferior vena cavography.  This demonstrated occlusive thrombus throughout the left femoropopliteal and  iliac venous system and in the infrarenal IVC up to the level of the IVC filter.  The catheter was exchanged over the guide wire for a 90-cm infusion catheter with 50 cm infusion length.  This was positioned spanning the iliofemoral thrombus.  In similar fashion, on the right a posterior tibial vein which was patent was accessed with a 21-gauge micropuncture needle.  The guide wire advanced centrally easily.  The transitional dilator was placed, allowed advancement of a Benson wire centrally.  A 5-French Kumpe catheter was  advanced, for right lower extremity venography. This demonstrated patency of the popliteal vein with occlusive thrombus in the mid aspect of the femoral vein and extending centrally through the right iliac venous system.  The Kumpe catheter was exchanged for a 6-French vascular sheath, through which a 90-cm infusion catheter with 40 cm infusion length was placed, with infusion side holes spanning from above the IVC filter to the proximal femoral vein.  Transcatheter protocol tenecteplase infusion was initiated bilaterally.  The sheaths were secured to the skin with O-Prolene sutures and catheters secured to the sheaths with Steri-Strips, then covered with sterile dressing.  The patient transferred to the ICU. The patient tolerated the procedure well.  No immediate complication.  IMPRESSION: 1.  Extensive occlusive left DVT through the femoropopliteal and iliac venous systems. 2.  Extensive occlusive right DVT   from the mid femoral vein through the iliac venous system. 3.  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 4.  Successful initiation of catheter directed bilateral venous thrombolytic infusion.  Plan is for follow-up venography 5/23.  Original Report Authenticated By: Osa Craver, M.D.   Ir Transcath/rx/inf Non Thrombolysis  01/29/2012  *RADIOLOGY REPORT*  Clinical data:  Bilateral lower extremity DVT.  Previous IVC filter placement for pulmonary emboli.  BILATERAL LOWER EXTREMITY VENOGRAPHY INFERIOR VENA CAVOGRAPHY INITIATION OF CATHETER DIRECTED THROMBOLYSIS ULTRASOUND GUIDANCE FOR VENOUS ACCESS X2  Comparison:  CT 01/26/2012  Technique and findings: The procedure, risks (including but not limited to bleeding, infection, organ damage), benefits, and alternatives were explained to the patient.  Questions regarding the procedure were encouraged and answered.  The patient understands and consents to the procedure.The popliteal regions were prepped and draped in usual sterile fashion.  Maximal barrier sterile technique was utilized including caps, mask, sterile gowns, sterile gloves, sterile drape, hand hygiene and skin antiseptic.  Intravenous Fentanyl and Versed were administered as conscious sedation during continuous cardiorespiratory monitoring by the radiology RN, with a total moderate sedation time of less than 30 minutes.  Skin was infiltrated locally with 1% lidocaine.  Under real time ultrasound guidance, the left popliteal vein was accessed with a 21- gauge micropuncture needle.  The vein was thrombosed.  There was no blood return.  However, the 0.018 guide wire advanced easily, its position confirmed on fluoroscopy.  The needle was exchanged for a micropuncture dilator.  A small contrast injection confirmed appropriate intraluminal venous placement.  The dilator was exchanged over [facility] wire for a 6-French vascular sheath, through which a 5-French Kumpe catheter was advanced for left lower extremity venography and inferior vena cavography.  This demonstrated occlusive thrombus throughout the left femoropopliteal and  iliac venous system and in the infrarenal IVC up to the level of the IVC filter.  The catheter was exchanged over the guide wire for a 90-cm infusion catheter with 50 cm infusion length.  This was positioned spanning the iliofemoral thrombus.  In similar fashion, on the right a posterior tibial vein which was patent was accessed with a 21-gauge micropuncture needle.  The guide wire advanced centrally easily.  The transitional dilator was placed, allowed advancement of a Benson wire centrally.  A 5-French Kumpe catheter was advanced, for right lower extremity venography. This demonstrated patency of the popliteal vein with occlusive thrombus in the mid aspect of the femoral vein and extending centrally through the right iliac venous system.  The Kumpe catheter was exchanged for a 6-French vascular sheath, through which a 90-cm infusion catheter with 40 cm infusion length  was placed, with infusion side holes spanning from above the IVC filter to the proximal femoral vein.  Transcatheter protocol tenecteplase infusion was initiated bilaterally.  The sheaths were secured to the skin with O-Prolene sutures and catheters secured to the sheaths with Steri-Strips, then covered with sterile dressing.  The patient transferred to the ICU. The patient tolerated the procedure well.  No immediate complication.  IMPRESSION: 1.  Extensive occlusive left DVT through the femoropopliteal and iliac venous systems. 2.  Extensive occlusive right DVT   from the mid femoral vein through the iliac venous system. 3.  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 4.  Successful initiation of catheter directed bilateral venous thrombolytic infusion.  Plan is for follow-up venography 5/23.  Original Report Authenticated By: Osa Craver, M.D.   Ir Transcath/rx/inf Non Thrombolysis  01/29/2012  *RADIOLOGY REPORT*  Clinical data:  Bilateral lower extremity DVT.  Previous IVC filter placement for pulmonary emboli.  BILATERAL LOWER EXTREMITY VENOGRAPHY INFERIOR VENA CAVOGRAPHY INITIATION OF CATHETER DIRECTED THROMBOLYSIS ULTRASOUND GUIDANCE FOR VENOUS ACCESS X2  Comparison:  CT 01/26/2012  Technique and findings: The procedure, risks (including but not limited to bleeding,  infection, organ damage), benefits, and alternatives were explained to the patient.  Questions regarding the procedure were encouraged and answered.  The patient understands and consents to the procedure.The popliteal regions were prepped and draped in usual sterile fashion. Maximal barrier sterile technique was utilized including caps, mask, sterile gowns, sterile gloves, sterile drape, hand hygiene and skin antiseptic.  Intravenous Fentanyl and Versed were administered as conscious sedation during continuous cardiorespiratory monitoring by the radiology RN, with a total moderate sedation time of less than 30 minutes.   Skin was infiltrated locally with 1% lidocaine.  Under real time ultrasound guidance, the left popliteal vein was accessed with a 21- gauge micropuncture needle.  The vein was thrombosed.  There was no blood return.  However, the 0.018 guide wire advanced easily, its position confirmed on fluoroscopy.  The needle was exchanged for a micropuncture dilator.  A small contrast injection confirmed appropriate intraluminal venous placement.  The dilator was exchanged over [facility] wire for a 6-French vascular sheath, through which a 5-French Kumpe catheter was advanced for left lower extremity venography and inferior vena cavography.  This demonstrated occlusive thrombus throughout the left femoropopliteal and  iliac venous system and in the infrarenal IVC up to the level of the IVC filter.  The catheter was exchanged over the guide wire for a 90-cm infusion catheter with 50 cm infusion length.  This was positioned spanning the iliofemoral thrombus.  In similar fashion, on the right a posterior tibial vein which was patent was accessed with a 21-gauge micropuncture needle.  The guide wire advanced centrally easily.  The transitional dilator was placed, allowed advancement of a Benson wire centrally.  A 5-French Kumpe catheter was advanced, for right lower extremity venography. This demonstrated patency of the popliteal vein with occlusive thrombus in the mid aspect of the femoral vein and extending centrally through the right iliac venous system.  The Kumpe catheter was exchanged for a 6-French vascular sheath, through which a 90-cm infusion catheter with 40 cm infusion length was placed, with infusion side holes spanning from above the IVC filter to the proximal femoral vein.  Transcatheter protocol tenecteplase infusion was initiated bilaterally.  The sheaths were secured to the skin with O-Prolene sutures and catheters secured to the sheaths with Steri-Strips, then covered with sterile dressing.  The patient transferred  to the ICU. The patient tolerated the procedure well.  No immediate complication.  IMPRESSION: 1.  Extensive occlusive left DVT through the femoropopliteal and iliac venous systems. 2.  Extensive occlusive right DVT   from the mid femoral vein through the iliac venous system. 3.  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 4.  Successful initiation of catheter directed bilateral venous thrombolytic infusion.  Plan is for follow-up venography 5/23.  Original Report Authenticated By: Osa Craver, M.D.   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  Started thrombolytic therapy on 01/27/2012.  PROCEDURE(S): BILATERAL LOWER EXTREMITY ANGIOGRAPHY follow-up after thrombolytics  Physician: Rachelle Hora. Henn, MD  Fluoroscopy time: 1.4 minutes  Contrast:  70 ml Omnipaque-300  Procedure:The patient was placed prone on the interventional table. Contrast was injected through the side arm of the bilateral sheaths.  The right infusion catheter was slightly pulled back towards the IVC filter at the end of the procedure.  The patient was returned to intensive care unit and thrombolytic therapy was restarted.  Findings:Left lower extremity:  There is now flow within the left femoral vein, left common femoral vein and left iliac veins.  There is residual nonocclusive thrombus in the lower left iliac vein just above the left common femoral vein.  There is residual narrowing and irregularity of the lower IVC most compatible with thrombus. The IVC is patent above the filter.  Right lower extremity:  There is antegrade flow in the left femoral vein and majority of the femoral thrombus has resolved.  Small amount of residual nonocclusive thrombus in the left iliac venous system.  Complications: None  Impression:The flow within the femoral veins and iliac veins have markedly improved.  Majority of the  thrombus has resolved in the lower extremities but there is residual nonocclusive thrombus bilaterally.  Residual narrowing and irregularity of the IVC is most likely related to thrombus.  Plan:  Plan to continue tenecteplase infusions overnight.  Patient is scheduled to follow up in interventional radiology on 01/29/2012 for follow-up angiography.  Original Report Authenticated By: Richarda Overlie, M.D.   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  Started thrombolytic therapy on 01/27/2012.  PROCEDURE(S): BILATERAL LOWER EXTREMITY ANGIOGRAPHY follow-up after thrombolytics  Physician: Rachelle Hora. Henn, MD  Fluoroscopy time: 1.4 minutes  Contrast:  70 ml Omnipaque-300  Procedure:The patient was placed prone on the interventional table. Contrast was injected through the side arm of the bilateral sheaths.  The right infusion catheter was slightly pulled back towards the IVC filter at the end of the procedure.  The patient was returned to intensive care unit and thrombolytic therapy was restarted.  Findings:Left lower extremity:  There is now flow within the left femoral vein, left common femoral vein and left iliac veins.  There is residual nonocclusive thrombus in the lower left iliac vein just above the left common femoral vein.  There is residual narrowing and irregularity of the lower IVC most compatible with thrombus. The IVC is patent above the filter.  Right lower extremity:  There is antegrade flow in the left femoral vein and majority of the femoral thrombus has resolved.  Small amount of residual nonocclusive thrombus in the left iliac venous system.  Complications: None  Impression:The flow within the femoral veins and iliac veins have markedly improved.  Majority of the thrombus has resolved in the lower extremities but there is residual nonocclusive thrombus bilaterally.  Residual narrowing and  irregularity of the IVC is most likely related to thrombus.  Plan:  Plan to continue tenecteplase infusions overnight.  Patient is scheduled to follow up in interventional radiology on 01/29/2012 for follow-up angiography.  Original Report Authenticated By: Richarda Overlie, M.D.   Ct Angio Ao+bifem W/cm &/or Wo/cm  01/26/2012  *RADIOLOGY REPORT*  Clinical Data:  Left thigh edema  CT ANGIOGRAPHY OF ABDOMINAL AORTA WITH ILIOFEMORAL RUNOFF  Technique:  Multidetector CT imaging of the abdomen, pelvis and lower extremities was performed using the standard protocol during bolus administration of intravenous contrast.  Multiplanar CT image reconstructions including MIPs were obtained to evaluate the vascular anatomy.  Contrast: OMNIPAQUE IOHEXOL 350 MG/ML SOLN  Comparison:   None.  Findings:  Aorta is patent.  Celiac and SMA are patent.  Branch vessels of the celiac and SMA are grossly patent.  Single renal arteries are patent.  IMA is patent.  Right internal, external, and common iliac arteries are widely patent.  Right lower extremity runoff demonstrates patency of the right common  femoral, superficial femoral, and profunda femoral arteries.  Right popliteal arteries patent.  Tibial vessels are grossly patent to the ankle for three-vessel runoff.  Left common femoral, superficial femoral, and profunda femoral arteries are patent.  Left popliteal artery is patent.  Tibial vessels are patent grossly for three-vessel runoff.  There is extensive subcutaneous edema involving both lower extremities left greater than right.  Soft tissue edema is seen surrounding the lower abdomen and pelvis. There is also extensive edema within the intraperitoneal fat towards the pelvis.  IVC filter is in place.  There is felt to be at least some low density thrombus within the unopacified infrarenal IVC and within the filter.  The common iliac veins are both expanded and are surrounded by inflammatory changes.  These findings can be seen  with DVT of the iliac venous system.  The study was not optimized or protocol for a venous evaluation therefore DVT cannot be excluded.  Diffuse bladder wall thickening.  Diffuse hepatic steatosis.  Kidneys, adrenal glands, spleen, pancreas are stable.  Gallbladder is stable.  Review of the MIP images confirms the above findings.  IMPRESSION: Aorta, iliac arteries, and lower extremity arteries are patent for three-vessel runoff bilaterally.  IVC filter is in place.  There is felt to be at least some thrombus contained within the filter.  The iliac veins are expanded and surrounded by inflammatory changes.  DVT of the iliac venous system cannot be excluded.  The patient just underwent a contrast-enhanced study.  Initial evaluation should include a Doppler study of the lower extremities.  CT venogram of the IVC and pelvis can be performed for indeterminate findings.  Original Report Authenticated By: Donavan Burnet, M.D.   Ct Femur Left Wo Contrast  01/26/2012  *RADIOLOGY REPORT*  Clinical Data: Left thigh swelling, pain, edema, and erythema.  CT OF THE LEFT FEMUR WITHOUT CONTRAST  Technique:  Contiguous axial CT images were obtained to the left femur and multiplanar reformatting was performed.  Comparison: None.  Findings: Abnormal wall thickening is present in the urinary bladder with surrounding fluid in the prevesical space, and mesenteric and presacral edema noted in the lower pelvis.  Edema tracks along the left common femoral vasculature.  The left external iliac, common femoral, and superficial femoral vein is significantly denser than the corresponding artery, raising suspicion for possible arterial occlusion.  Small left inguinal lymph nodes are present.  In addition to the perivascular edema, there is diffuse subcutaneous edema in the left thigh as well as edema tracking along the deep fascial margins, especially anteriorly.  A small knee effusion is present.  No significant abnormal osseous lesions  observed.  IMPRESSION:  1. Possible arterial occlusion in the left thigh- correlate with distal pulses and consider Doppler assessment or CT angiogram. 2.  Diffuse bladder wall thickening may reflect cystitis.  There is surrounding fluid in the prevesical space.  There is also pelvic mesenteric edema and presacral edema. 3.  Diffuse subcutaneous edema is present in the left thigh, with abnormal edema tracking along the fascial planes, but without drainable abscess observed.  There is also perivascular edema along the deep venous structures and adjacent arterial structures.  Original Report Authenticated By: Dellia Cloud, M.D.   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  Bilateral lower extremity transcatheter thrombolytic therapy was begun on 01/27/2012.  1.  FOLLOW-UP LEFT LOWER EXTREMITY VENOGRAPHY  ON FINAL DATE OF THROMBOLYTIC THERAPY 2.  FOLLOW-UP RIGHT LOWER EXTREMITY VENOGRAPHY ON FINAL DATE OF THROMBOLYTIC THERAPY 3.  VENOUS ANGIOPLASTY OF THE LEFT ILIAC VEINS AND LOWER IVC  Comparison:  01/27/2012 and 01/28/2012.  Sedation: 4.0 mg IV Versed; 200 mcg IV Fentanyl.  Total Moderate Sedation Time: 40 minutes.  Contrast:  110 ml Omnipaque-300  Fluoroscopy Time: 5.1 minutes.  Procedure:  The procedure, risks, benefits, and alternatives were explained to the patient.  Questions regarding the procedure were encouraged and answered.  The patient understands and consents to the procedure.  Both popliteal sheaths were prepped with Betadine and draped in a sterile fashion.  A sterile gown and sterile gloves were used for the procedure.  Local anesthesia was provided with 1% Lidocaine.  Venography was performed through the indwelling infusion catheters as well as through popliteal sheaths with full visualization of the veins of the lower extremities bilaterally, iliac veins and IVC.   Balloon angioplasty was performed at the level of the left common iliac vein and lower IVC with a 10 mm x 4 cm Conquest balloon. This was followed by angiography.  Further angioplasty was then performed across the same segment with a 12 mm x 4 cm Mustang balloon.  Additional angiography was performed through a diagnostic catheter.  Upon completion, thrombolytic infusions were discontinued and infusion catheters and sheaths were removed.  Hemostasis was obtained at the level of bilateral popliteal venous access utilizing manual compression.  Complications: None  Findings: Compared with venography yesterday, there is further interval lysis of thrombus at the level of the femoral veins bilaterally and iliac veins.  Improved antegrade flow was also present in both lower extremities.  Improved flow was also noted at the level of the inferior vena cava and indwelling IVC filter.  There remained areas of irregular stenosis at the level of the left common iliac vein and lower IVC up to the IVC filter.  This has the appearance of chronic thrombus and chronic stenosis.  Balloon dilatation was performed resulting in some improved flow with residual stenosis remaining.  Given distribution of stenosis extending up into the IVC, it was felt that metallic stent placement would not be of long-term benefit.  The IVC filter also should remain in place.  It is recommended that chronic anticoagulation be considered as the patient is at high risk for redevelopment of iliofemoral DVT without anticoagulation.  IMPRESSION: Improvement in patency of the femoral and iliac veins after continue thrombolytic therapy.  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  This was treated with 10 mm and 12 mm balloon angioplasty today.  Some degree of chronic narrowing remains present.  Recommend chronic anticoagulation.  Thrombolytic therapy was discontinued and bilateral popliteal venous access removed upon  completion.  Original Report Authenticated By: Reola Calkins, M.D.   Ir US Guide Vasc Access Left  01/29/2012  *RADIOLOGY REPORT*  Clinical data:  Bilateral lower extremity DVT.  Previous IVC filter placement for pulmonary emboli.  BILATERAL LOWER EXTREMITY VENOGRAPHY INFERIOR VENA CAVOGRAPHY INITIATION OF CATHETER DIRECTED THROMBOLYSIS ULTRASOUND GUIDANCE FOR VENOUS ACCESS X2  Comparison:  CT 01/26/2012  Technique and findings: The procedure, risks (including but not limited to bleeding, infection, organ damage), benefits, and alternatives were explained to the patient.  Questions regarding the procedure were encouraged and answered.  The patient understands and consents to the procedure.The popliteal regions were prepped and draped in usual sterile fashion. Maximal barrier sterile technique was utilized including  caps, mask, sterile gowns, sterile gloves, sterile drape, hand hygiene and skin antiseptic.  Intravenous Fentanyl and Versed were administered as conscious sedation during continuous cardiorespiratory monitoring by the radiology RN, with a total moderate sedation time of less than 30 minutes.  Skin was infiltrated locally with 1% lidocaine.  Under real time ultrasound guidance, the left popliteal vein was accessed with a 21- gauge micropuncture needle.  The vein was thrombosed.  There was no blood return.  However, the 0.018 guide wire advanced easily, its position confirmed on fluoroscopy.  The needle was exchanged for a micropuncture dilator.  A small contrast injection confirmed appropriate intraluminal venous placement.  The dilator was exchanged over [facility] wire for a 6-French vascular sheath, through which a 5-French Kumpe catheter was advanced for left lower extremity venography and inferior vena cavography.  This demonstrated occlusive thrombus throughout the left femoropopliteal and  iliac venous system and in the infrarenal IVC up to the level of the IVC filter.  The catheter was  exchanged over the guide wire for a 90-cm infusion catheter with 50 cm infusion length.  This was positioned spanning the iliofemoral thrombus.  In similar fashion, on the right a posterior tibial vein which was patent was accessed with a 21-gauge micropuncture needle.  The guide wire advanced centrally easily.  The transitional dilator was placed, allowed advancement of a Benson wire centrally.  A 5-French Kumpe catheter was advanced, for right lower extremity venography. This demonstrated patency of the popliteal vein with occlusive thrombus in the mid aspect of the femoral vein and extending centrally through the right iliac venous system.  The Kumpe catheter was exchanged for a 6-French vascular sheath, through which a 90-cm infusion catheter with 40 cm infusion length was placed, with infusion side holes spanning from above the IVC filter to the proximal femoral vein.  Transcatheter protocol tenecteplase infusion was initiated bilaterally.  The sheaths were secured to the skin with O-Prolene sutures and catheters secured to the sheaths with Steri-Strips, then covered with sterile dressing.  The patient transferred to the ICU. The patient tolerated the procedure well.  No immediate complication.  IMPRESSION: 1.  Extensive occlusive left DVT through the femoropopliteal and iliac venous systems. 2.  Extensive occlusive right DVT   from the mid femoral vein through the iliac venous system. 3.  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 4.  Successful initiation of catheter directed bilateral venous thrombolytic infusion.  Plan is for follow-up venography 5/23.  Original Report Authenticated By: Osa Craver, M.D.   Ir US Guide Vasc Access Right  01/29/2012  *RADIOLOGY REPORT*  Clinical data:  Bilateral lower extremity DVT.  Previous IVC filter placement for pulmonary emboli.  BILATERAL LOWER EXTREMITY VENOGRAPHY INFERIOR VENA CAVOGRAPHY INITIATION OF CATHETER DIRECTED THROMBOLYSIS  ULTRASOUND GUIDANCE FOR VENOUS ACCESS X2  Comparison:  CT 01/26/2012  Technique and findings: The procedure, risks (including but not limited to bleeding, infection, organ damage), benefits, and alternatives were explained to the patient.  Questions regarding the procedure were encouraged and answered.  The patient understands and consents to the procedure.The popliteal regions were prepped and draped in usual sterile fashion. Maximal barrier sterile technique was utilized including caps, mask, sterile gowns, sterile gloves, sterile drape, hand hygiene and skin antiseptic.  Intravenous Fentanyl and Versed were administered as conscious sedation during continuous cardiorespiratory monitoring by the radiology RN, with a total moderate sedation time of less than 30 minutes.  Skin was infiltrated locally with 1% lidocaine.  Under real time ultrasound guidance, the left popliteal vein was accessed with a 21- gauge micropuncture needle.  The vein was thrombosed.  There was no blood return.  However, the 0.018 guide wire advanced easily, its position confirmed on fluoroscopy.  The needle was exchanged for a micropuncture dilator.  A small contrast injection confirmed appropriate intraluminal venous placement.  The dilator was exchanged over [facility] wire for a 6-French vascular sheath, through which a 5-French Kumpe catheter was advanced for left lower extremity venography and inferior vena cavography.  This demonstrated occlusive thrombus throughout the left femoropopliteal and  iliac venous system and in the infrarenal IVC up to the level of the IVC filter.  The catheter was exchanged over the guide wire for a 90-cm infusion catheter with 50 cm infusion length.  This was positioned spanning the iliofemoral thrombus.  In similar fashion, on the right a posterior tibial vein which was patent was accessed with a 21-gauge micropuncture needle.  The guide wire advanced centrally easily.  The transitional dilator was placed,  allowed advancement of a Benson wire centrally.  A 5-French Kumpe catheter was advanced, for right lower extremity venography. This demonstrated patency of the popliteal vein with occlusive thrombus in the mid aspect of the femoral vein and extending centrally through the right iliac venous system.  The Kumpe catheter was exchanged for a 6-French vascular sheath, through which a 90-cm infusion catheter with 40 cm infusion length was placed, with infusion side holes spanning from above the IVC filter to the proximal femoral vein.  Transcatheter protocol tenecteplase infusion was initiated bilaterally.  The sheaths were secured to the skin with O-Prolene sutures and catheters secured to the sheaths with Steri-Strips, then covered with sterile dressing.  The patient transferred to the ICU. The patient tolerated the procedure well.  No immediate complication.  IMPRESSION: 1.  Extensive occlusive left DVT through the femoropopliteal and iliac venous systems. 2.  Extensive occlusive right DVT   from the mid femoral vein through the iliac venous system. 3.  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 4.  Successful initiation of catheter directed bilateral venous thrombolytic infusion.  Plan is for follow-up venography 5/23.  Original Report Authenticated By: Osa Craver, M.D.   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  Bilateral lower extremity transcatheter thrombolytic therapy was begun on 01/27/2012.  1.  FOLLOW-UP LEFT LOWER EXTREMITY VENOGRAPHY ON FINAL DATE OF THROMBOLYTIC THERAPY 2.  FOLLOW-UP RIGHT LOWER EXTREMITY VENOGRAPHY ON FINAL DATE OF THROMBOLYTIC THERAPY 3.  VENOUS ANGIOPLASTY OF THE LEFT ILIAC VEINS AND LOWER IVC  Comparison:  01/27/2012 and 01/28/2012.  Sedation: 4.0 mg IV Versed; 200 mcg IV Fentanyl.  Total Moderate Sedation Time:  40 minutes.  Contrast:  110 ml Omnipaque-300  Fluoroscopy Time: 5.1 minutes.  Procedure:  The procedure, risks, benefits, and alternatives were explained to the patient.  Questions regarding the procedure were encouraged and answered.  The patient understands and consents to the procedure.  Both popliteal sheaths were prepped with Betadine and draped in a sterile fashion.  A sterile gown and sterile gloves were used for the procedure.  Local anesthesia was provided with 1% Lidocaine.  Venography was performed through the indwelling infusion catheters as well as through popliteal sheaths with full visualization of the veins of the lower extremities bilaterally, iliac veins and IVC.  Balloon angioplasty was performed at the level of the left common iliac vein and lower IVC with a 10 mm x 4 cm Conquest balloon. This was followed by angiography.  Further angioplasty was then performed across the same segment with a 12 mm x 4 cm Mustang balloon.  Additional angiography was performed through a diagnostic catheter.  Upon completion, thrombolytic infusions were discontinued and infusion catheters and sheaths were removed.  Hemostasis was obtained at the level of bilateral popliteal venous access utilizing manual compression.  Complications: None  Findings: Compared with venography yesterday, there is further interval lysis of thrombus at the level of the femoral veins bilaterally and iliac veins.  Improved antegrade flow was also present in both lower extremities.  Improved flow was also noted at the level of the inferior vena cava and indwelling IVC filter.  There remained areas of irregular stenosis at the level of the left common iliac vein and lower IVC up to the IVC filter.  This has the appearance of chronic thrombus and chronic stenosis.  Balloon dilatation was performed resulting in some improved flow with residual stenosis remaining.  Given distribution of stenosis extending up into the IVC, it was felt that  metallic stent placement would not be of long-term benefit.  The IVC filter also should remain in place.  It is recommended that chronic anticoagulation be considered as the patient is at high risk for redevelopment of iliofemoral DVT without anticoagulation.  IMPRESSION: Improvement in patency of the femoral and iliac veins after continue thrombolytic therapy.  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  This was treated with 10 mm and 12 mm balloon angioplasty today.  Some degree of chronic narrowing remains present.  Recommend chronic anticoagulation.  Thrombolytic therapy was discontinued and bilateral popliteal venous access removed upon completion.  Original Report Authenticated By: Reola Calkins, M.D.   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  Bilateral lower extremity transcatheter thrombolytic therapy was begun on 01/27/2012.  1.  FOLLOW-UP LEFT LOWER EXTREMITY VENOGRAPHY ON FINAL DATE OF THROMBOLYTIC THERAPY 2.  FOLLOW-UP RIGHT LOWER EXTREMITY VENOGRAPHY ON FINAL DATE OF THROMBOLYTIC THERAPY 3.  VENOUS ANGIOPLASTY OF THE LEFT ILIAC VEINS AND LOWER IVC  Comparison:  01/27/2012 and 01/28/2012.  Sedation: 4.0 mg IV Versed; 200 mcg IV Fentanyl.  Total Moderate Sedation Time: 40 minutes.  Contrast:  110 ml Omnipaque-300  Fluoroscopy Time: 5.1 minutes.  Procedure:  The procedure, risks, benefits, and alternatives were explained to the patient.  Questions regarding the procedure were encouraged and answered.  The patient understands and consents to the procedure.  Both popliteal sheaths were prepped with Betadine and draped in a sterile fashion.  A sterile gown and sterile gloves were used for the procedure.  Local anesthesia was provided with 1% Lidocaine.  Venography was performed through the indwelling infusion  catheters as well as through popliteal sheaths with full visualization of the veins of the lower extremities bilaterally, iliac veins and IVC.  Balloon angioplasty was performed at the level of the left common iliac vein and lower IVC with a 10 mm x 4 cm Conquest balloon. This was followed by angiography.  Further angioplasty was then performed across the same segment with a 12 mm x 4 cm Mustang balloon.  Additional angiography was performed through a diagnostic catheter.  Upon  completion, thrombolytic infusions were discontinued and infusion catheters and sheaths were removed.  Hemostasis was obtained at the level of bilateral popliteal venous access utilizing manual compression.  Complications: None  Findings: Compared with venography yesterday, there is further interval lysis of thrombus at the level of the femoral veins bilaterally and iliac veins.  Improved antegrade flow was also present in both lower extremities.  Improved flow was also noted at the level of the inferior vena cava and indwelling IVC filter.  There remained areas of irregular stenosis at the level of the left common iliac vein and lower IVC up to the IVC filter.  This has the appearance of chronic thrombus and chronic stenosis.  Balloon dilatation was performed resulting in some improved flow with residual stenosis remaining.  Given distribution of stenosis extending up into the IVC, it was felt that metallic stent placement would not be of long-term benefit.  The IVC filter also should remain in place.  It is recommended that chronic anticoagulation be considered as the patient is at high risk for redevelopment of iliofemoral DVT without anticoagulation.  IMPRESSION: Improvement in patency of the femoral and iliac veins after continue thrombolytic therapy.  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  This was treated with 10 mm and 12 mm balloon angioplasty today.  Some degree of chronic narrowing  remains present.  Recommend chronic anticoagulation.  Thrombolytic therapy was discontinued and bilateral popliteal venous access removed upon completion.  Original Report Authenticated By: Reola Calkins, M.D.          OTHER PROCEDURES: BLE Duplex: Findings consistent with acute deep vein thrombosis involving the right lower extremity Coursing from the distal femoral vein through the femoral and common femoral vein. There popliteal is compressible with non phasic flow. There is minute flow noted in the area of the DVT. - Findings consistent with acute deep vein thrombosis involving the left lower extremity Coursing from the mid popliteal vein through the femoral and common femoral veins. There is a near occlusion of the femoral and common femoral veins with only minute to minimal flow in the popliteal. - Superficial thrombus noted in the same small branch of the left distal greater saphenous vein and now in the proximal greater saphenous at the level of the confluence with the common femoral vein - There is no evidence of superficial thrombus in the right lower extremity, - No evidence of Baker's cyst on the right or left.   Recent Results (from the past 240 hour(s))  MRSA PCR SCREENING     Status: Normal   Collection Time   01/27/12  6:19 PM      Component Value Range Status Comment   MRSA by PCR NEGATIVE  NEGATIVE  Final   URINE CULTURE     Status: Normal   Collection Time   01/28/12  4:45 PM      Component Value Range Status Comment   Specimen Description URINE, CLEAN CATCH   Final    Special Requests NONE   Final    Culture  Setup Time [id number]   Final    Colony Count NO GROWTH   Final    Culture NO GROWTH   Final    Report Status 01/29/2012 FINAL   Final     BRIEF ADMITTING H & P: [name] is an 46 y.o. male with prior hx of PE with placement of IVC filter in 2011. He was seen in the ED last  week for left lower leg swelling is now presenting with increase  swelling and pain. Patient states for the past week he has had increasing swelling to his left lower extremity with associated pain. Describe pain is sharp and throbbing sensation worse with palpation and with walking. Pain is radiating up his left leg and extending into lower abdomen. Pt denies SOB, fever, productive cough, hemoptysis, or chest pain. Pt has been seen in ED for same 3 days ago. Chest CTA is negative for PE. DVT study of Left lower leg is positive for superficial thrombosis but negative for DVT. Pt was given pain medication and crutches however he is here due to worsening of his symptoms. Currently not on any anticoagulants.       Hospital Course:  Present on Admission:  .DVT of lower extremity, bilateral: Pt showed extensive DVT to BLE's. He was started on IV heparin. Interventional radiology was consulted and he underwent thrombolysis. Pt was then started on Coumadin and continued on heparin for bridging. Dr. Gaylyn Rong of oncology saw the patient tin consultation and recommended lifelong anticoagulation. Due to the patient's social circumstances Coumadin surveillance posed a medical risk for this patient who has demon started medical compliance. After much discussion in a multidisciplinary manner, it was decided that Xarelto which does no require surveillance would be a more reasonable choice and presented less medical risk to this patient. Pt is being discharged on Xarelto 15 mg BID until 02/14/2012 and then 20 mg daily thereafter beginning on 02/15/2012.   Marland KitchenAnemia: Hemoglobin has been stable at 9.8 at time of discharge.  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 Nevertheless, Oncology recommends lifelong anticoagulation in light of a H/O PE and now B/L DVT requiring thrombolysis.   Disposition and Follow-up:   Pt is being discharged to an ALF. He is to follow up with Dr. Gaylyn Rong in 1 week. Discharge Orders     Future Orders Please Complete By Expires   Diet general      Activity as tolerated - No restrictions      Discharge wound care:      Comments:   Band aids over puncture sites.      DISCHARGE EXAM:  General: Alert, awake, oriented x3, in no acute distress.  Vital Signs: Blood pressure 153/95, pulse 79, temperature 98.7 F (37.1 C), temperature source Oral, resp. rate 18, height 6\' 2"  (1.88 m), weight 80.1 kg (176 lb 9.4 oz), SpO2 93.00%. HEENT: Joes/AT PEERL, EOMI  Neck: Trachea midline, no masses, no thyromegal,y no JVD, no carotid bruit  OROPHARYNX: Moist, No exudate/ erythema/lesions.  Heart: Regular rate and rhythm, without murmurs, rubs, gallops, PMI non-displaced, no heaves or thrills on palpation.  Lungs: Clear to auscultation, no wheezing or rhonchi noted. No increased vocal fremitus resonant to percussion  Abdomen: Soft, nontender, nondistended, positive bowel sounds, no masses no hepatosplenomegaly noted..  Musculoskeletal: No warm swelling or erythema around joints, no spinal tenderness noted. Puncture sites on the skin show no evidence of infection.    Basename 02/03/12 0330  NA 139  K 3.8  CL 103  CO2 26  GLUCOSE 106*  BUN 5*  CREATININE 0.71  CALCIUM 8.4  MG --  PHOS --   No results found for this basename: AST:2,ALT:2,ALKPHOS:2,BILITOT:2,PROT:2,ALBUMIN:2 in the last 72 hours No results found for this basename: LIPASE:2,AMYLASE:2 in the last 72 hours  Basename 02/05/12 0340 02/04/12 0345  WBC 9.2 10.2  NEUTROABS -- --  HGB 9.8* 9.9*  HCT 30.8* 30.9*  MCV 103.0* 102.3*  PLT 620* 623*   Total time for discharge process approximately 30 mniutes Signed: Anterrio Mccleery A. 02/05/2012, 12:35 PM

## 2012-02-08 ENCOUNTER — Telehealth: Payer: Self-pay | Admitting: Oncology

## 2012-02-08 NOTE — Telephone Encounter (Signed)
called [facility] to provided appt d/t for 06/06 was told that pt checked in on 05/31 and was also discharged on 05/31.  Informed Amy Michelle and rtn chart back to HIM 

## 2012-02-08 NOTE — Telephone Encounter (Signed)
called [facility] to provided appt d/t for 06/06 was told that pt checked in on 05/31 and was also discharged on 05/31.  Informed [name] and rtn chart back to HIM

## 2012-02-09 ENCOUNTER — Other Ambulatory Visit (HOSPITAL_COMMUNITY): Payer: Self-pay | Admitting: Internal Medicine

## 2012-02-09 DIAGNOSIS — I82409 Acute embolism and thrombosis of unspecified deep veins of unspecified lower extremity: Secondary | ICD-10-CM

## 2012-02-11 ENCOUNTER — Inpatient Hospital Stay: Payer: Self-pay | Admitting: Oncology

## 2012-02-11 ENCOUNTER — Ambulatory Visit: Payer: Self-pay

## 2012-02-11 ENCOUNTER — Other Ambulatory Visit: Payer: Self-pay | Admitting: Lab

## 2012-02-17 ENCOUNTER — Telehealth: Payer: Self-pay | Admitting: *Deleted

## 2012-02-17 NOTE — Telephone Encounter (Signed)
Closing follow up on this patient due to lack of ability to locate or contact patient. Called again to [facility] today, [phone], patient still has not returned or called and they have no way to reach patient either. Will close this hospital followup referral.

## 2012-02-27 ENCOUNTER — Encounter (HOSPITAL_COMMUNITY): Payer: Self-pay

## 2012-02-27 ENCOUNTER — Emergency Department (HOSPITAL_COMMUNITY)
Admission: EM | Admit: 2012-02-27 | Discharge: 2012-02-28 | Disposition: A | Discharge status: Home / Self Care | Payer: Medicaid Other | Attending: Emergency Medicine | Admitting: Emergency Medicine

## 2012-02-27 DIAGNOSIS — M7989 Other specified soft tissue disorders: Secondary | ICD-10-CM | POA: Insufficient documentation

## 2012-02-27 DIAGNOSIS — R609 Edema, unspecified: Secondary | ICD-10-CM | POA: Insufficient documentation

## 2012-02-27 DIAGNOSIS — I82409 Acute embolism and thrombosis of unspecified deep veins of unspecified lower extremity: Secondary | ICD-10-CM

## 2012-02-27 DIAGNOSIS — I2510 Atherosclerotic heart disease of native coronary artery without angina pectoris: Secondary | ICD-10-CM | POA: Insufficient documentation

## 2012-02-27 DIAGNOSIS — F17200 Nicotine dependence, unspecified, uncomplicated: Secondary | ICD-10-CM | POA: Insufficient documentation

## 2012-02-27 HISTORY — DX: Alcohol abuse, uncomplicated: F10.10

## 2012-02-27 MED ORDER — RIVAROXABAN 15 MG PO TABS
15.0000 mg | ORAL_TABLET | Freq: Two times a day (BID) | ORAL | Status: DC
  Filled 2012-02-27: qty 1

## 2012-02-27 MED ORDER — ADULT MULTIVITAMIN W/MINERALS CH
1.0000 | ORAL_TABLET | Freq: Every day | ORAL | Status: DC
  Administered 2012-02-27 – 2012-02-28 (×2): 1 via ORAL
  Filled 2012-02-27 (×2): qty 1

## 2012-02-27 MED ORDER — VITAMIN B-1 100 MG PO TABS
100.0000 mg | ORAL_TABLET | Freq: Every day | ORAL | Status: DC
  Administered 2012-02-27 – 2012-02-28 (×2): 100 mg via ORAL
  Filled 2012-02-27 (×2): qty 1

## 2012-02-27 MED ORDER — FOLIC ACID 1 MG PO TABS
1.0000 mg | ORAL_TABLET | Freq: Every day | ORAL | Status: DC
  Administered 2012-02-27 – 2012-02-28 (×2): 1 mg via ORAL
  Filled 2012-02-27 (×2): qty 1

## 2012-02-27 MED ORDER — RIVAROXABAN 15 MG PO TABS
15.0000 mg | ORAL_TABLET | Freq: Two times a day (BID) | ORAL | Status: DC
  Administered 2012-02-27 – 2012-02-28 (×2): 15 mg via ORAL
  Filled 2012-02-27 (×4): qty 1

## 2012-02-27 MED ORDER — PANTOPRAZOLE SODIUM 40 MG PO TBEC
40.0000 mg | DELAYED_RELEASE_TABLET | Freq: Every day | ORAL | Status: DC
  Administered 2012-02-27: 40 mg via ORAL
  Filled 2012-02-27: qty 1

## 2012-02-27 NOTE — ED Provider Notes (Signed)
History     CSN: [id number]  Arrival date & time 02/27/12  1744   First MD Initiated Contact with Patient 02/27/12 1918      Chief Complaint  Patient presents with  . Leg Swelling  . Leg Pain  . Alcohol Intoxication    (Consider location/radiation/quality/duration/timing/severity/associated sxs/prior treatment) HPI [name] is a 46 y.o. male who is here for left leg swelling for 4 days. He stopped taking his anticoagulant medication 3 weeks ago. He's had no recent trauma chest pain, shortness of breath, weakness, or dizziness. He was discharged from the hospital on 02/06/12 to an assisted living facility. He left that day to go to [place] with his brother. He has not taken any medicine since then. He arrived back in [place] 2 weeks ago, and has been living on the street, drinking alcohol, and trying to o get help. He does not want to return to the assisted living facility.     tPast Medical History  Diagnosis Date  . Coronary artery disease   . Degenerative joint disease of spine   . Pulmonary embolism 06/2010  . Degenerative joint disease   . 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 ETOH abuse     Past Surgical History  Procedure Date  . Left elbow surgery   . Tonsillectomy     No family history on file.  History  Substance Use Topics  . Smoking status: Current Everyday Smoker -- 3.0 packs/day    Types: Cigarettes  . Smokeless tobacco: Never Used  . Alcohol Use: Yes     heavy drinker daily      Review of Systems  All other systems reviewed and are negative.    Allergies  Review of patient's allergies indicates no known allergies.  Home Medications   Current Outpatient Rx  Name Route Sig Dispense Refill  . FOLIC ACID 1 MG PO TABS Oral Take 1 tablet (1 mg total) by mouth daily. 30 tablet 0  . ADULT MULTIVITAMIN W/MINERALS CH Oral Take 1 tablet by mouth daily. 30 tablet 0  . PANTOPRAZOLE SODIUM 40 MG PO TBEC Oral Take 1 tablet (40 mg total)  by mouth daily at 12 noon. 30 tablet 0  . RIVAROXABAN 15 MG PO TABS Oral Take 1 tablet (15 mg total) by mouth 2 (two) times daily with a meal. Until 02/14/2012 19 tablet 0  . THIAMINE HCL 100 MG PO TABS Oral Take 1 tablet (100 mg total) by mouth daily. 30 tablet 0    BP 137/94  Pulse 81  Temp 98.5 F (36.9 C) (Oral)  Resp 18  Ht 6\' 2"  (1.88 m)  Wt 174 lb (78.926 kg)  BMI 22.34 kg/m2  SpO2 100%  Physical Exam  Nursing note and vitals reviewed. Constitutional: He is oriented to person, place, and time. He appears well-developed and well-nourished.  HENT:  Head: Normocephalic and atraumatic.  Right Ear: External ear normal.  Left Ear: External ear normal.  Eyes: Conjunctivae and EOM are normal. Pupils are equal, round, and reactive to light.  Neck: Normal range of motion and phonation normal. Neck supple.  Cardiovascular: Normal rate.   Pulmonary/Chest: Effort normal and breath sounds normal. He exhibits no bony tenderness.  Abdominal: Normal appearance.  Musculoskeletal: He exhibits edema (Moderate left lower leg swelling, with redness. He is neurovascular intact distally in the left foot).  Neurological: He is alert and oriented to person, place, and time. He has normal strength. No cranial nerve deficit or sensory deficit. He  exhibits normal muscle tone. Coordination normal.  Skin: Skin is warm, dry and intact.  Psychiatric: He has a normal mood and affect. His behavior is normal. Judgment and thought content normal.    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 He is high risk for recurrence.  Regular medications were restarted in the ED.  Patient does express a desire for placement. Social work was contacted and had no further recommendation for assistance.  Attempted to contact care management to get medications.  Patient will be  kept overnight to establish a treatment plan.      Labs Reviewed - No data to display No results found.   1. DVT (deep venous thrombosis)       MDM  Apparent recurrance of left lower leg DVT, secondary to medication noncompliance. Patient is homeless, and does not want placement. I will attempt to get medication for him since he will likely worsen without it.      Expected disposition: Social work and care management, evaluation on patient to create a plan acceptable to the patient and ED physician staff.  Flint Melter, MD 02/27/12 [phone]

## 2012-02-27 NOTE — ED Notes (Signed)
Pt presents with no acute distress.  PTAR for transport- Pt reports pain and swelling to LLE for several days.  Pt reports has not taken medications for 3 weeks- Pt presents with moderate swelling to LLE. Pt admits to ETOH

## 2012-02-27 NOTE — ED Notes (Signed)
Pt presents intoxicated and agitated- [employer] officer present and Charge Gillis Ends RN present to evaluate this pt.  Pt continues to use vulgar language.  Will continues to reassess

## 2012-02-28 MED ORDER — THIAMINE HCL 100 MG/ML IJ SOLN: 100.0000 mg | Freq: Every day | INTRAMUSCULAR | Status: DC

## 2012-02-28 MED ORDER — ONDANSETRON 4 MG PO TBDP
4.0000 mg | ORAL_TABLET | Freq: Once | ORAL | Status: AC
  Administered 2012-02-28: 4 mg via ORAL
  Filled 2012-02-28: qty 1

## 2012-02-28 MED ORDER — RIVAROXABAN 15 MG PO TABS: 15.0000 mg | ORAL_TABLET | Freq: Every day | ORAL | Status: DC

## 2012-02-28 MED ORDER — LORAZEPAM 2 MG/ML IJ SOLN: 1.0000 mg | Freq: Four times a day (QID) | INTRAMUSCULAR | Status: DC | PRN

## 2012-02-28 MED ORDER — FOLIC ACID 1 MG PO TABS: 1.0000 mg | ORAL_TABLET | Freq: Every day | ORAL | Status: DC

## 2012-02-28 MED ORDER — LORAZEPAM 1 MG PO TABS
0.0000 mg | ORAL_TABLET | Freq: Four times a day (QID) | ORAL | Status: DC
  Administered 2012-02-28: 1 mg via ORAL

## 2012-02-28 MED ORDER — LORAZEPAM 1 MG PO TABS
1.0000 mg | ORAL_TABLET | Freq: Four times a day (QID) | ORAL | Status: DC | PRN
  Filled 2012-02-28 (×2): qty 1

## 2012-02-28 MED ORDER — OXYCODONE-ACETAMINOPHEN 5-325 MG PO TABS
2.0000 | ORAL_TABLET | Freq: Once | ORAL | Status: AC
  Administered 2012-02-28: 2 via ORAL
  Filled 2012-02-28: qty 2

## 2012-02-28 MED ORDER — ADULT MULTIVITAMIN W/MINERALS CH: 1.0000 | ORAL_TABLET | Freq: Every day | ORAL | Status: DC

## 2012-02-28 MED ORDER — LORAZEPAM 1 MG PO TABS: 0.0000 mg | ORAL_TABLET | Freq: Two times a day (BID) | ORAL | Status: DC

## 2012-02-28 MED ORDER — VITAMIN B-1 100 MG PO TABS: 100.0000 mg | ORAL_TABLET | Freq: Every day | ORAL | Status: DC

## 2012-02-28 NOTE — ED Provider Notes (Signed)
  Physical Exam  BP 101/67  Pulse 95  Temp 98.6 F (37 C) (Oral)  Resp 18  Ht 6\' 2"  (1.88 m)  Wt 174 lb (78.926 kg)  BMI 22.34 kg/m2  SpO2 100%  Physical Exam  ED Course  Procedures  MDM Patient's been seen by social work and care management. He does not want to go to either a shelter her back to assisted living. He is homeless and [place]. This being the case he cannot get Medicaid and cannot get his medications. He was given a card for the role to assistance. He has already used up his truck assistance to the hospital. He was given a prescription for the role to. He'll be discharged.      Juliet Rude. Rubin Payor, MD 02/28/12 1047

## 2012-02-28 NOTE — ED Notes (Signed)
MD Brooke Dare notified that patient would like to speak with him.

## 2012-02-28 NOTE — ED Notes (Signed)
Patient sts that he drinks "a case" of beer everyday. Doesn't feel like at this time he is withdrawing, but will inform MD of patients dependence.

## 2012-02-28 NOTE — Progress Notes (Signed)
CM spoke with patient concerning plan for discharge. Dr.Pickering contacted RN concerning medication assistance. Pt ineligible for indigent funds, has used funds within past month. Patient homeless, CSW involved. Patient provided information concerning DSS for emergency assistance. Patient provided with discount card for Xarelto. Patient informed that receiving assistance from DSS is vital to patient's condition, need to establish PCP. Patient states living in camp site off of [address] Patient states having access to a MD, DR.Mayford Knife that could possibly assist him. Patient states possible residence with brother. CSW to provide a few bus passes for pt to have transportation to DSS.    [name] [phone]

## 2012-02-28 NOTE — Discharge Instructions (Signed)
Deep Vein Thrombosis A deep vein thrombosis (DVT) is a blood clot (thrombus) that develops in a deep vein. A DVT is a clot in the deep, larger veins of the leg, arm, or pelvis. These are more dangerous than clots that might form in veins on the surface of the body. Deep vein thrombosis can lead to complications if the clot breaks off and travels in the bloodstream to the lungs. CAUSES Blood clots form in a vein for different reasons. Usually several things cause blood clots. They include:  The flow of blood slows down.   The inside of the vein is damaged in some way.   The person has a condition that makes blood clot more easily. These conditions may include:   Older age (especially over 75 years old).   Having a history of blood clots.   Having major or lengthy surgery. Hip surgery is particularly high-risk.   Breaking a hip or leg.   Sitting or lying still for a long time.   Cancer or cancer treatment.   Having a long, thin tube (catheter) placed inside a vein during a medical procedure.   Being overweight (obese).   Pregnancy and childbirth.   Medicines with estrogen.   Smoking.   Other circulation or heart problems.  SYMPTOMS When a clot forms, it can either partially or totally block the blood flow in that vein. Symptoms of a DVT can include:  Swelling of the leg or arm, especially if one side is much worse.   Warmth and redness of the leg or arm, especially if one side is much worse.   Pain in an arm or leg. If the clot is in the leg, symptoms may be more noticeable or worse when standing or walking.  If the blood clot travels to the lung, it may cause:  Shortness of breath.   Chest pain. The pain may be worsened by deep breaths.   Coughing up thick mucus (phlegm), possibly flecked with blood.  Anyone with these symptoms should get emergency medical treatment right away. Call your local emergency services (911 in [place] if you have these symptoms. DIAGNOSIS If  a DVT is suspected, your caregiver will take a full medical history. He or she will also perform a physical exam. Tests that also may be required include:  Studies of the clotting properties of the blood.   An ultrasound scan.   X-rays to show the flow of blood when special dye is injected into the veins (venography).   Studies of your lungs if you have any chest symptoms.  PREVENTION  Exercise the legs regularly. Take a brisk 30 minute walk every day.   Maintain a weight that is appropriate for your height.   Avoid sitting or lying in bed for long periods of time without moving your legs.   Women, particularly those over the age of 35, should consider the risks and benefits of taking estrogen medicines, including birth control pills.   Do not smoke, especially if you take estrogen medicines.   Long-distance travel can increase your risk. You should exercise your legs by walking or pumping the muscles every hour.   In hospital prevention:   Prevention may include medical and nonmedical measures.  TREATMENT  The most common treatment for DVT is blood thinning (anticoagulant) medicine, which reduces the blood's tendency to clot. Anticoagulants can stop new blood clots from forming and old ones from growing. They cannot dissolve existing clots. Your body does this by itself over time.   Anticoagulants can be given by mouth, by intravenous (IV) access, or by injection. Your caregiver will determine the best program for you.   Less commonly, clot-dissolving drugs (thrombolytics) are used to dissolve a DVT. They carry a high risk of bleeding, so they are used mainly in severe cases.   Very rarely, a blood clot in the leg needs to be removed surgically.   If you are unable to take anticoagulants, your caregiver may arrange for you to have a filter placed in a main vein in your belly (abdomen). This filter prevents clots from traveling to your lungs.  HOME CARE INSTRUCTIONS  Take all  medicines prescribed by your caregiver. Follow the directions carefully.   You will most likely continue taking anticoagulants after you leave the hospital. Your caregiver will advise you on the length of treatment (usually 3 to 6 months, sometimes for life).   Taking too much or too little of an anticoagulant is dangerous. While taking this type of medicine, you will need to have regular blood tests to be sure the dose is correct. The dose can change for many reasons. It is critically important that you take this medicine exactly as prescribed, and that you have blood tests exactly as directed.   Many foods can interfere with anticoagulants. These include foods high in vitamin K, such as spinach, kale, broccoli, cabbage, collard and turnip greens, Brussels sprouts, peas, cauliflower, seaweed, parsley, beef and pork liver, green tea, and soybean oil. Your caregiver should discuss limits on these foods with you or you should arrange a visit with a dietician to answer your questions.   Many medicines can interfere with anticoagulants. You must tell your caregiver about any and all medicines you take. This includes all vitamins and supplements. Be especially cautious with aspirin and anti-inflammatory medicines. Ask your caregiver before taking these.   Anticoagulants can have side effects, mostly excessive bruising or bleeding. You will need to hold pressure over cuts for longer than usual. Avoid alcoholic drinks or consume only very small amounts while taking this medicine.   If you are taking an anticoagulant:   Wear a medical alert bracelet.   Notify your dentist or other caregivers before procedures.   Avoid contact sports.   Ask your caregiver how soon you can go back to normal activities. Not being active can lead to new clots. Ask for a list of what you should and should not do.   Exercise your lower leg muscles. This is important while traveling.   You may need to wear compression  stockings. These are tight elastic stockings that apply pressure to the lower legs. This can help keep the blood in the legs from clotting.   If you are a smoker, you should quit.   Learn as much as you can about DVT.  SEEK MEDICAL CARE IF:  You have unusual bruising or any bleeding problems.   The swelling or pain in your affected arm or leg is not gradually improving.   You anticipate surgery or long-distance travel. You should get specific advice on DVT prevention.   You discover other family members with blood clots. This may require further testing for inherited diseases or conditions.  SEEK IMMEDIATE MEDICAL CARE IF:  You develop chest pain.   You develop severe shortness of breath.   You begin to cough up bloody mucus or phlegm (sputum).   You feel dizzy or faint.   You develop swelling or pain in the leg.   You have   breathing problems after traveling.  MAKE SURE YOU:  Understand these instructions.   Will watch your condition.   Will get help right away if you are not doing well or get worse.  Document Released: 08/24/2005 Document Revised: 08/13/2011 Document Reviewed: 10/16/2010 Exit[employer] Patient Information 2012 [employer]. 

## 2012-02-28 NOTE — ED Notes (Signed)
Padded side rails placed on patients bed. Suction at bedside. O2 per [name] at 2 L, currently applied to patient. Patient on monitor and O2 sat monitor.

## 2012-02-28 NOTE — ED Notes (Signed)
Left message with social worker.

## 2012-02-28 NOTE — ED Notes (Signed)
CSW met with pt per request of MD to discuss community resources that may be available to pt upon d/c. Pt informed Clinical research associate that he does not have a permanent residence at this time and that he [place] the woods [place] of [address]  CSW discussed the option of utilizing shelters in the area such as [employer]. Pt reported that he does not prefer to stay at a shelter (due to the rules and regulations) oppose to living at his current campsite. Pt informed Clinical research associate that he is trying to apply for Medicaid and that he received coordination assistance from his inpatient CSW during his past admission. CSW provided pt with information to contact [facility] DSS to follow up on his Medicaid application and encouraged pt to apply for emergency assistance through [facility] DSS for additional community support. Pt verbalized to writer that he will follow up tomorrow 02/29/12 with [facility] DSS. CSW provided pt with bus passes to assist with transportation upon discharge and for follow up at DSS. No other concerns verbalized at this time to CSW.   Janann Colonel., MSW, [facility] Clinical Social Worker [phone]

## 2012-03-02 ENCOUNTER — Emergency Department (HOSPITAL_COMMUNITY)
Admission: EM | Admit: 2012-03-02 | Discharge: 2012-03-02 | Disposition: A | Discharge status: Home / Self Care | Payer: Medicaid Other | Attending: Emergency Medicine | Admitting: Emergency Medicine

## 2012-03-02 ENCOUNTER — Encounter (HOSPITAL_COMMUNITY): Payer: Self-pay | Admitting: *Deleted

## 2012-03-02 DIAGNOSIS — I82409 Acute embolism and thrombosis of unspecified deep veins of unspecified lower extremity: Secondary | ICD-10-CM | POA: Insufficient documentation

## 2012-03-02 DIAGNOSIS — Z86711 Personal history of pulmonary embolism: Secondary | ICD-10-CM | POA: Insufficient documentation

## 2012-03-02 DIAGNOSIS — I2510 Atherosclerotic heart disease of native coronary artery without angina pectoris: Secondary | ICD-10-CM | POA: Insufficient documentation

## 2012-03-02 DIAGNOSIS — F17200 Nicotine dependence, unspecified, uncomplicated: Secondary | ICD-10-CM | POA: Insufficient documentation

## 2012-03-02 MED ORDER — RIVAROXABAN 20 MG PO TABS
20.0000 mg | ORAL_TABLET | Freq: Once | ORAL | Status: AC
  Administered 2012-03-02: 20 mg via ORAL
  Filled 2012-03-02: qty 1

## 2012-03-02 MED ORDER — OXYCODONE-ACETAMINOPHEN 5-325 MG PO TABS
2.0000 | ORAL_TABLET | Freq: Once | ORAL | Status: AC
  Administered 2012-03-02: 2 via ORAL
  Filled 2012-03-02: qty 2

## 2012-03-02 MED ORDER — RIVAROXABAN 20 MG PO TABS: 20.0000 mg | ORAL_TABLET | Freq: Every day | ORAL | Status: DC

## 2012-03-02 NOTE — Discharge Instructions (Signed)
Deep Vein Thrombosis A deep vein thrombosis (DVT) is a blood clot (thrombus) that develops in a deep vein. A DVT is a clot in the deep, larger veins of the leg, arm, or pelvis. These are more dangerous than clots that might form in veins on the surface of the body. Deep vein thrombosis can lead to complications if the clot breaks off and travels in the bloodstream to the lungs. CAUSES Blood clots form in a vein for different reasons. Usually several things cause blood clots. They include:  The flow of blood slows down.   The inside of the vein is damaged in some way.   The person has a condition that makes blood clot more easily. These conditions may include:   Older age (especially over 75 years old).   Having a history of blood clots.   Having major or lengthy surgery. Hip surgery is particularly high-risk.   Breaking a hip or leg.   Sitting or lying still for a long time.   Cancer or cancer treatment.   Having a long, thin tube (catheter) placed inside a vein during a medical procedure.   Being overweight (obese).   Pregnancy and childbirth.   Medicines with estrogen.   Smoking.   Other circulation or heart problems.  SYMPTOMS When a clot forms, it can either partially or totally block the blood flow in that vein. Symptoms of a DVT can include:  Swelling of the leg or arm, especially if one side is much worse.   Warmth and redness of the leg or arm, especially if one side is much worse.   Pain in an arm or leg. If the clot is in the leg, symptoms may be more noticeable or worse when standing or walking.  If the blood clot travels to the lung, it may cause:  Shortness of breath.   Chest pain. The pain may be worsened by deep breaths.   Coughing up thick mucus (phlegm), possibly flecked with blood.  Anyone with these symptoms should get emergency medical treatment right away. Call your local emergency services (911 in [place] if you have these symptoms. DIAGNOSIS If  a DVT is suspected, your caregiver will take a full medical history. He or she will also perform a physical exam. Tests that also may be required include:  Studies of the clotting properties of the blood.   An ultrasound scan.   X-rays to show the flow of blood when special dye is injected into the veins (venography).   Studies of your lungs if you have any chest symptoms.  PREVENTION  Exercise the legs regularly. Take a brisk 30 minute walk every day.   Maintain a weight that is appropriate for your height.   Avoid sitting or lying in bed for long periods of time without moving your legs.   Women, particularly those over the age of 35, should consider the risks and benefits of taking estrogen medicines, including birth control pills.   Do not smoke, especially if you take estrogen medicines.   Long-distance travel can increase your risk. You should exercise your legs by walking or pumping the muscles every hour.   In hospital prevention:   Prevention may include medical and nonmedical measures.  TREATMENT  The most common treatment for DVT is blood thinning (anticoagulant) medicine, which reduces the blood's tendency to clot. Anticoagulants can stop new blood clots from forming and old ones from growing. They cannot dissolve existing clots. Your body does this by itself over time.   Anticoagulants can be given by mouth, by intravenous (IV) access, or by injection. Your caregiver will determine the best program for you.   Less commonly, clot-dissolving drugs (thrombolytics) are used to dissolve a DVT. They carry a high risk of bleeding, so they are used mainly in severe cases.   Very rarely, a blood clot in the leg needs to be removed surgically.   If you are unable to take anticoagulants, your caregiver may arrange for you to have a filter placed in a main vein in your belly (abdomen). This filter prevents clots from traveling to your lungs.  HOME CARE INSTRUCTIONS  Take all  medicines prescribed by your caregiver. Follow the directions carefully.   You will most likely continue taking anticoagulants after you leave the hospital. Your caregiver will advise you on the length of treatment (usually 3 to 6 months, sometimes for life).   Taking too much or too little of an anticoagulant is dangerous. While taking this type of medicine, you will need to have regular blood tests to be sure the dose is correct. The dose can change for many reasons. It is critically important that you take this medicine exactly as prescribed, and that you have blood tests exactly as directed.   Many foods can interfere with anticoagulants. These include foods high in vitamin K, such as spinach, kale, broccoli, cabbage, collard and turnip greens, Brussels sprouts, peas, cauliflower, seaweed, parsley, beef and pork liver, green tea, and soybean oil. Your caregiver should discuss limits on these foods with you or you should arrange a visit with a dietician to answer your questions.   Many medicines can interfere with anticoagulants. You must tell your caregiver about any and all medicines you take. This includes all vitamins and supplements. Be especially cautious with aspirin and anti-inflammatory medicines. Ask your caregiver before taking these.   Anticoagulants can have side effects, mostly excessive bruising or bleeding. You will need to hold pressure over cuts for longer than usual. Avoid alcoholic drinks or consume only very small amounts while taking this medicine.   If you are taking an anticoagulant:   Wear a medical alert bracelet.   Notify your dentist or other caregivers before procedures.   Avoid contact sports.   Ask your caregiver how soon you can go back to normal activities. Not being active can lead to new clots. Ask for a list of what you should and should not do.   Exercise your lower leg muscles. This is important while traveling.   You may need to wear compression  stockings. These are tight elastic stockings that apply pressure to the lower legs. This can help keep the blood in the legs from clotting.   If you are a smoker, you should quit.   Learn as much as you can about DVT.  SEEK MEDICAL CARE IF:  You have unusual bruising or any bleeding problems.   The swelling or pain in your affected arm or leg is not gradually improving.   You anticipate surgery or long-distance travel. You should get specific advice on DVT prevention.   You discover other family members with blood clots. This may require further testing for inherited diseases or conditions.  SEEK IMMEDIATE MEDICAL CARE IF:  You develop chest pain.   You develop severe shortness of breath.   You begin to cough up bloody mucus or phlegm (sputum).   You feel dizzy or faint.   You develop swelling or pain in the leg.   You have   breathing problems after traveling.  MAKE SURE YOU:  Understand these instructions.   Will watch your condition.   Will get help right away if you are not doing well or get worse.  Document Released: 08/24/2005 Document Revised: 08/13/2011 Document Reviewed: 10/16/2010 Exit[employer] Patient Information 2012 [employer]. 

## 2012-03-02 NOTE — ED Provider Notes (Signed)
History    This chart was scribed for [name] Shi, MD, MD by Smitty Pluck. The patient was seen in room TR09C and the patient's care was started at 2:09PM.   CSN: [id number]  Arrival date & time 03/02/12  1305   First MD Initiated Contact with Patient 03/02/12 1356      Chief Complaint  Patient presents with  . Leg Pain     Patient is a 46 y.o. male presenting with leg pain. The history is provided by the patient.  Leg Pain    Jaicion Laurie is a 46 y.o. male who presents to the Emergency Department complaining of moderate left lower leg pain onset 2 weeks ago. Pt reports that he was diagnosed with blood clot in left lower leg. Pt reports that he can not afford to fill his prescription for blood anticoagulants. Denies any other pain. Denies any injury.   Past Medical History  Diagnosis Date  . Coronary artery disease   . Degenerative joint disease of spine   . Pulmonary embolism 06/2010  . Degenerative joint disease   . 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 ETOH abuse     Past Surgical History  Procedure Date  . Left elbow surgery   . Tonsillectomy     History reviewed. No pertinent family history.  History  Substance Use Topics  . Smoking status: Current Everyday Smoker -- 3.0 packs/day    Types: Cigarettes  . Smokeless tobacco: Never Used  . Alcohol Use: Yes     heavy drinker daily      Review of Systems  All other systems reviewed and are negative.   10 Systems reviewed and all are negative for acute change except as noted in the HPI.   Allergies  Review of patient's allergies indicates no known allergies.  Home Medications   Current Outpatient Rx  Name Route Sig Dispense Refill  . RIVAROXABAN 20 MG PO TABS Oral Take 1 tablet (20 mg total) by mouth daily. 10 tablet 0    BP 135/86  Pulse 92  Temp 97.6 F (36.4 C) (Oral)  Resp 20  SpO2 100%  Physical Exam  Nursing note and vitals reviewed. Constitutional: He is oriented to person,  place, and time. He appears well-developed and well-nourished. No distress.  HENT:  Head: Normocephalic and atraumatic.  Eyes: Pupils are equal, round, and reactive to light.  Neck: Normal range of motion.  Cardiovascular: Normal rate and intact distal pulses.   Pulmonary/Chest: No respiratory distress.  Abdominal: Normal appearance. He exhibits no distension.  Musculoskeletal: Normal range of motion.       Left upper leg: He exhibits tenderness and swelling.       Left lower leg: He exhibits tenderness and swelling.  Neurological: He is alert and oriented to person, place, and time. No cranial nerve deficit.  Skin: Skin is warm and dry. No rash noted.  Psychiatric: He has a normal mood and affect. His behavior is normal.    ED Course  Procedures (including critical care time) DIAGNOSTIC STUDIES: Oxygen Saturation is 97% on room air, normal by my interpretation.    COORDINATION OF CARE: 2:12PM EDP discusses pt ED treatment with pt    Labs Reviewed - No data to display No results found.   1. DVT (deep venous thrombosis)       MDM  Case discussed with Child psychotherapist and pharmacy.  Patient arranged to get Xarelto medication for DVT.  Follow up arrangement already made.  I personally performed the services described in this documentation, which was scribed in my presence. The recorded information has been reviewed and considered.       [name] Shi, MD 03/10/12 [phone]

## 2012-03-02 NOTE — ED Notes (Addendum)
[name] in social work paged. States that she will come see pt. [name] in case management called and will come see pt regarding prescriptions.

## 2012-03-02 NOTE — ED Notes (Signed)
Pt is in [employer] custody, reports having blood clot in left lower leg, has already been diagnosed but can not afford to fill prescription for blood thinners, has been without x 2 weeks.

## 2012-03-02 NOTE — ED Notes (Signed)
Waiting for pharmacy to speak with pt regarding xarelto

## 2012-03-02 NOTE — Progress Notes (Signed)
Consulted by attending nurse for medication assistance for Xarelto. Obtained [name] patient assistance form for patient to complete and take to provider Dr. Radford Pax refers him to. I have given him a 10-day free trial card and confirmed that the Outpatient Pharmacy at [facility] has these in stock. SW will meet with patient to discuss [facility] resources.

## 2012-03-02 NOTE — ED Notes (Signed)
Pt triage RN- pt was released from [employer] custody after being triaged.

## 2012-03-02 NOTE — ED Notes (Signed)
Pulses audible with doppler in left foot.

## 2012-03-09 ENCOUNTER — Encounter: Payer: Self-pay | Admitting: Internal Medicine

## 2012-03-28 ENCOUNTER — Encounter (HOSPITAL_COMMUNITY): Payer: Self-pay | Admitting: *Deleted

## 2012-03-28 ENCOUNTER — Emergency Department (HOSPITAL_COMMUNITY): Payer: Medicaid Other

## 2012-03-28 ENCOUNTER — Inpatient Hospital Stay (HOSPITAL_COMMUNITY)
Admission: EM | Admit: 2012-03-28 | Discharge: 2012-04-08 | DRG: 300 | Disposition: A | Discharge status: Home / Self Care | Payer: Medicaid Other | Attending: Internal Medicine | Admitting: Internal Medicine

## 2012-03-28 DIAGNOSIS — F17200 Nicotine dependence, unspecified, uncomplicated: Secondary | ICD-10-CM | POA: Diagnosis present

## 2012-03-28 DIAGNOSIS — I82403 Acute embolism and thrombosis of unspecified deep veins of lower extremity, bilateral: Secondary | ICD-10-CM

## 2012-03-28 DIAGNOSIS — Z5900 Homelessness unspecified: Secondary | ICD-10-CM

## 2012-03-28 DIAGNOSIS — Z9119 Patient's noncompliance with other medical treatment and regimen: Secondary | ICD-10-CM

## 2012-03-28 DIAGNOSIS — D6862 Lupus anticoagulant syndrome: Secondary | ICD-10-CM | POA: Diagnosis present

## 2012-03-28 DIAGNOSIS — I824Y9 Acute embolism and thrombosis of unspecified deep veins of unspecified proximal lower extremity: Principal | ICD-10-CM | POA: Diagnosis present

## 2012-03-28 DIAGNOSIS — R894 Abnormal immunological findings in specimens from other organs, systems and tissues: Secondary | ICD-10-CM | POA: Diagnosis present

## 2012-03-28 DIAGNOSIS — E871 Hypo-osmolality and hyponatremia: Secondary | ICD-10-CM | POA: Diagnosis present

## 2012-03-28 DIAGNOSIS — Z720 Tobacco use: Secondary | ICD-10-CM | POA: Diagnosis present

## 2012-03-28 DIAGNOSIS — F1010 Alcohol abuse, uncomplicated: Secondary | ICD-10-CM | POA: Diagnosis present

## 2012-03-28 DIAGNOSIS — D1779 Benign lipomatous neoplasm of other sites: Secondary | ICD-10-CM | POA: Diagnosis present

## 2012-03-28 DIAGNOSIS — E876 Hypokalemia: Secondary | ICD-10-CM

## 2012-03-28 DIAGNOSIS — D649 Anemia, unspecified: Secondary | ICD-10-CM

## 2012-03-28 DIAGNOSIS — Z9089 Acquired absence of other organs: Secondary | ICD-10-CM

## 2012-03-28 DIAGNOSIS — Z86711 Personal history of pulmonary embolism: Secondary | ICD-10-CM

## 2012-03-28 DIAGNOSIS — R062 Wheezing: Secondary | ICD-10-CM | POA: Diagnosis present

## 2012-03-28 DIAGNOSIS — D6859 Other primary thrombophilia: Secondary | ICD-10-CM

## 2012-03-28 DIAGNOSIS — I82409 Acute embolism and thrombosis of unspecified deep veins of unspecified lower extremity: Secondary | ICD-10-CM | POA: Diagnosis present

## 2012-03-28 DIAGNOSIS — IMO0002 Reserved for concepts with insufficient information to code with codable children: Secondary | ICD-10-CM | POA: Diagnosis present

## 2012-03-28 DIAGNOSIS — M1990 Unspecified osteoarthritis, unspecified site: Secondary | ICD-10-CM

## 2012-03-28 DIAGNOSIS — M7989 Other specified soft tissue disorders: Secondary | ICD-10-CM

## 2012-03-28 DIAGNOSIS — I2510 Atherosclerotic heart disease of native coronary artery without angina pectoris: Secondary | ICD-10-CM | POA: Diagnosis present

## 2012-03-28 DIAGNOSIS — Z7982 Long term (current) use of aspirin: Secondary | ICD-10-CM

## 2012-03-28 DIAGNOSIS — I82402 Acute embolism and thrombosis of unspecified deep veins of left lower extremity: Secondary | ICD-10-CM

## 2012-03-28 DIAGNOSIS — Z91199 Patient's noncompliance with other medical treatment and regimen due to unspecified reason: Secondary | ICD-10-CM

## 2012-03-28 LAB — CBC WITH DIFFERENTIAL/PLATELET
Eosinophils Absolute: 0.1 10*3/uL (ref 0.0–0.7)
Lymphs Abs: 1.1 10*3/uL (ref 0.7–4.0)
MCH: 33.5 pg (ref 26.0–34.0)
Neutrophils Relative %: 80 % — ABNORMAL HIGH (ref 43–77)
Platelets: 91 10*3/uL — ABNORMAL LOW (ref 150–400)
RBC: 3.97 MIL/uL — ABNORMAL LOW (ref 4.22–5.81)
WBC: 10.5 10*3/uL (ref 4.0–10.5)

## 2012-03-28 LAB — MAGNESIUM: Magnesium: 1.9 mg/dL (ref 1.5–2.5)

## 2012-03-28 LAB — COMPREHENSIVE METABOLIC PANEL
ALT: 28 U/L (ref 0–53)
Albumin: 3.5 g/dL (ref 3.5–5.2)
Alkaline Phosphatase: 88 U/L (ref 39–117)
GFR calc Af Amer: >90 mL/min (ref >90)
Glucose, Bld: 88 mg/dL (ref 70–99)
Potassium: 3.9 mEq/L (ref 3.5–5.1)
Sodium: 128 mEq/L — ABNORMAL LOW (ref 135–145)
Total Protein: 7.3 g/dL (ref 6.0–8.3)

## 2012-03-28 LAB — RAPID URINE DRUG SCREEN, HOSP PERFORMED
Amphetamines: NOT DETECTED
Benzodiazepines: POSITIVE — AB
Cocaine: NOT DETECTED
Opiates: NOT DETECTED

## 2012-03-28 LAB — URINALYSIS, ROUTINE W REFLEX MICROSCOPIC
Glucose, UA: NEGATIVE mg/dL
Hgb urine dipstick: NEGATIVE
Ketones, ur: >80 mg/dL — AB
Protein, ur: NEGATIVE mg/dL

## 2012-03-28 LAB — PROTIME-INR: INR: 0.98 (ref 0.00–1.49)

## 2012-03-28 MED ORDER — ACETAMINOPHEN 325 MG PO TABS
650.0000 mg | ORAL_TABLET | Freq: Once | ORAL | Status: AC
  Administered 2012-03-28: 650 mg via ORAL
  Filled 2012-03-28: qty 2

## 2012-03-28 MED ORDER — FOLIC ACID 1 MG PO TABS
1.0000 mg | ORAL_TABLET | Freq: Every day | ORAL | Status: DC
Start: 2012-03-28 — End: 2012-04-08
  Administered 2012-03-28 – 2012-04-07 (×11): 1 mg via ORAL
  Filled 2012-03-28 (×12): qty 1

## 2012-03-28 MED ORDER — ACETAMINOPHEN 650 MG RE SUPP: 650.0000 mg | Freq: Four times a day (QID) | RECTAL | Status: DC | PRN

## 2012-03-28 MED ORDER — COUMADIN BOOK
Freq: Once | Status: AC
  Administered 2012-03-28: 22:00:00
  Filled 2012-03-28: qty 1

## 2012-03-28 MED ORDER — IOHEXOL 350 MG/ML SOLN
100.0000 mL | Freq: Once | INTRAVENOUS | Status: AC | PRN
  Administered 2012-03-28: 100 mL via INTRAVENOUS

## 2012-03-28 MED ORDER — NICOTINE 21 MG/24HR TD PT24
21.0000 mg | MEDICATED_PATCH | Freq: Every day | TRANSDERMAL | Status: DC
  Administered 2012-03-29 – 2012-04-07 (×10): 21 mg via TRANSDERMAL
  Filled 2012-03-28 (×11): qty 1

## 2012-03-28 MED ORDER — PANTOPRAZOLE SODIUM 40 MG PO TBEC
40.0000 mg | DELAYED_RELEASE_TABLET | Freq: Every day | ORAL | Status: DC
  Administered 2012-03-28 – 2012-04-07 (×11): 40 mg via ORAL
  Filled 2012-03-28 (×13): qty 1

## 2012-03-28 MED ORDER — ONDANSETRON HCL 4 MG/2ML IJ SOLN
4.0000 mg | Freq: Four times a day (QID) | INTRAMUSCULAR | Status: DC | PRN
  Filled 2012-03-28: qty 2

## 2012-03-28 MED ORDER — ONDANSETRON HCL 4 MG PO TABS
4.0000 mg | ORAL_TABLET | Freq: Four times a day (QID) | ORAL | Status: DC | PRN
  Filled 2012-03-28: qty 1

## 2012-03-28 MED ORDER — WARFARIN SODIUM 10 MG PO TABS
10.0000 mg | ORAL_TABLET | Freq: Once | ORAL | Status: AC
  Administered 2012-03-28: 10 mg via ORAL
  Filled 2012-03-28: qty 1

## 2012-03-28 MED ORDER — HEPARIN (PORCINE) IN NACL 100-0.45 UNIT/ML-% IJ SOLN
1400.0000 [IU]/h | INTRAMUSCULAR | Status: DC
  Administered 2012-03-29: 1400 [IU]/h via INTRAVENOUS
  Filled 2012-03-28: qty 250

## 2012-03-28 MED ORDER — LORAZEPAM 2 MG/ML IJ SOLN: 0.0000 mg | Freq: Two times a day (BID) | INTRAMUSCULAR | Status: AC

## 2012-03-28 MED ORDER — SODIUM CHLORIDE 0.9 % IV SOLN
INTRAVENOUS | Status: DC
  Administered 2012-03-28 – 2012-03-29 (×2): via INTRAVENOUS
  Administered 2012-03-30 – 2012-04-01 (×2): 1000 mL via INTRAVENOUS
  Administered 2012-04-03 – 2012-04-05 (×3): via INTRAVENOUS

## 2012-03-28 MED ORDER — LORAZEPAM 1 MG PO TABS
1.0000 mg | ORAL_TABLET | Freq: Four times a day (QID) | ORAL | Status: AC | PRN
  Administered 2012-03-30 (×3): 1 mg via ORAL
  Filled 2012-03-28 (×3): qty 1

## 2012-03-28 MED ORDER — OXYCODONE HCL 5 MG PO TABS
5.0000 mg | ORAL_TABLET | Freq: Four times a day (QID) | ORAL | Status: DC | PRN
  Administered 2012-03-29 – 2012-04-08 (×25): 5 mg via ORAL
  Filled 2012-03-28 (×26): qty 1

## 2012-03-28 MED ORDER — HEPARIN BOLUS VIA INFUSION
4000.0000 [IU] | Freq: Once | INTRAVENOUS | Status: AC
  Administered 2012-03-29: 4000 [IU] via INTRAVENOUS
  Filled 2012-03-28: qty 4000

## 2012-03-28 MED ORDER — ADULT MULTIVITAMIN W/MINERALS CH
1.0000 | ORAL_TABLET | Freq: Every day | ORAL | Status: DC
  Administered 2012-03-28 – 2012-04-07 (×11): 1 via ORAL
  Filled 2012-03-28 (×12): qty 1

## 2012-03-28 MED ORDER — WARFARIN VIDEO
Freq: Once | Status: AC
  Administered 2012-03-29: 18:00:00

## 2012-03-28 MED ORDER — LORAZEPAM 2 MG/ML IJ SOLN
1.0000 mg | Freq: Four times a day (QID) | INTRAMUSCULAR | Status: AC | PRN
  Filled 2012-03-28: qty 1

## 2012-03-28 MED ORDER — HYDROMORPHONE HCL PF 1 MG/ML IJ SOLN
1.0000 mg | Freq: Four times a day (QID) | INTRAMUSCULAR | Status: DC | PRN
  Administered 2012-03-28 – 2012-04-05 (×12): 1 mg via INTRAVENOUS
  Filled 2012-03-28 (×13): qty 1

## 2012-03-28 MED ORDER — ALBUTEROL SULFATE HFA 108 (90 BASE) MCG/ACT IN AERS: 2.0000 | INHALATION_SPRAY | Freq: Four times a day (QID) | RESPIRATORY_TRACT | Status: DC | PRN

## 2012-03-28 MED ORDER — VITAMIN B-1 100 MG PO TABS
100.0000 mg | ORAL_TABLET | Freq: Every day | ORAL | Status: DC
  Administered 2012-03-28 – 2012-04-07 (×11): 100 mg via ORAL
  Filled 2012-03-28 (×12): qty 1

## 2012-03-28 MED ORDER — LORAZEPAM 2 MG/ML IJ SOLN
0.0000 mg | Freq: Four times a day (QID) | INTRAMUSCULAR | Status: AC
  Administered 2012-03-28 – 2012-03-29 (×4): 1 mg via INTRAVENOUS
  Administered 2012-03-29: 2 mg via INTRAVENOUS
  Administered 2012-03-29 – 2012-03-30 (×2): 1 mg via INTRAVENOUS
  Filled 2012-03-28 (×6): qty 1

## 2012-03-28 MED ORDER — WARFARIN - PHARMACIST DOSING INPATIENT
Freq: Every day | Status: DC
  Administered 2012-03-30 – 2012-04-04 (×4)

## 2012-03-28 MED ORDER — ACETAMINOPHEN 325 MG PO TABS: 650.0000 mg | ORAL_TABLET | Freq: Four times a day (QID) | ORAL | Status: DC | PRN

## 2012-03-28 MED ORDER — TRAMADOL HCL 50 MG PO TABS
100.0000 mg | ORAL_TABLET | Freq: Once | ORAL | Status: AC
  Administered 2012-03-28: 100 mg via ORAL
  Filled 2012-03-28: qty 2

## 2012-03-28 MED ORDER — ENOXAPARIN SODIUM 80 MG/0.8ML ~~LOC~~ SOLN
80.0000 mg | Freq: Once | SUBCUTANEOUS | Status: AC
  Administered 2012-03-28: 80 mg via SUBCUTANEOUS
  Filled 2012-03-28: qty 0.8

## 2012-03-28 NOTE — ED Notes (Signed)
Bed:WA22<BR> Expected date:<BR> Expected time:<BR> Means of arrival:<BR> Comments:<BR> EMS

## 2012-03-28 NOTE — ED Provider Notes (Addendum)
History     CSN: [id number]  Arrival date & time 03/28/12  4098   First MD Initiated Contact with Patient 03/28/12 [phone]      Chief Complaint  Patient presents with  . Shortness of Breath  . Leg Swelling    (Consider location/radiation/quality/duration/timing/severity/associated sxs/prior treatment) HPI  Patient presents via EMS. He relates he was diagnosed with DVT in his left leg a few months ago and he was placed on xeralto. He states he only had a 10 day supply and he has run out at least 2 weeks ago. He does not know where he is supposed to followup with but states he thinks he missed his appointment. He states possibly the outpatient clinics at [facility]. He relates he's had increased swelling of his left leg 2 days ago with increased swelling in his calf and in his thigh. He also relates he started getting short of breath with dyspnea on exertion 2 days ago. He denies any chest pain. He states he had a dry cough today but denies fever. He states this morning he's had nausea with dry heaves. He denies any diarrhea. EMS reported his pulse ox was 99% on room air.  PCP none  Past Medical History  Diagnosis Date  . Coronary artery disease   . Degenerative joint disease of spine   . Pulmonary embolism 06/2010  . Degenerative joint disease   . 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 ETOH abuse     Past Surgical History  Procedure Date  . Left elbow surgery   . Tonsillectomy     History reviewed. No pertinent family history.  History  Substance Use Topics  . Smoking status: Current Everyday Smoker -- 3.0 packs/day for 30 years    Types: Cigarettes  . Smokeless tobacco: Never Used  . Alcohol Use: Yes     heavy drinker daily   Drinks a fifth a day + 6 pack beer, pt states he has never done detox or rehab Unemployed homeless   Review of Systems  All other systems reviewed and are negative.    Allergies  Review of patient's allergies indicates no known  allergies.  Home Medications   Current Outpatient Rx  Name Route Sig Dispense Refill  . ASPIRIN-CAFFEINE 845-65 MG PO PACK Oral Take 1 Package by mouth every 8 (eight) hours as needed. Pain    . IBUPROFEN 200 MG PO TABS Oral Take 600 mg by mouth every 6 (six) hours as needed. Pain    . RIVAROXABAN 20 MG PO TABS Oral Take 1 tablet (20 mg total) by mouth daily. 10 tablet 0    BP 133/81  Pulse 97  Temp 98.6 F (37 C) (Oral)  Resp 18  SpO2 100%  Vital signs normal      Physical Exam  Nursing note and vitals reviewed. Constitutional: He is oriented to person, place, and time. He appears well-developed and well-nourished.  Non-toxic appearance. He does not appear ill. No distress.  HENT:  Head: Normocephalic and atraumatic.  Right Ear: External ear normal.  Left Ear: External ear normal.  Nose: Nose normal. No mucosal edema or rhinorrhea.  Mouth/Throat: Oropharynx is clear and moist and mucous membranes are normal. No dental abscesses or uvula swelling.  Eyes: Conjunctivae and EOM are normal. Pupils are equal, round, and reactive to light.  Neck: Normal range of motion and full passive range of motion without pain. Neck supple.  Cardiovascular: Normal rate, regular rhythm and normal heart sounds.  Exam reveals  no gallop and no friction rub.   No murmur heard. Pulmonary/Chest: Effort normal and breath sounds normal. No respiratory distress. He has no wheezes. He has no rhonchi. He has no rales. He exhibits no tenderness and no crepitus.  Abdominal: Soft. Normal appearance and bowel sounds are normal. He exhibits no distension. There is no tenderness. There is no rebound and no guarding.  Musculoskeletal: Normal range of motion. He exhibits edema. He exhibits no tenderness.       Moves all extremities well. Patient's noted to have marked swelling of his left leg compared to the right. He has firmness and tightness in his left calf.  Neurological: He is alert and oriented to person,  place, and time. He has normal strength. No cranial nerve deficit.  Skin: Skin is warm, dry and intact. No rash noted. No erythema. No pallor.  Psychiatric: His speech is normal and behavior is normal. His mood appears not anxious.       Affect flat    ED Course  Procedures (including critical care time)   Medications  enoxaparin (LOVENOX) injection 80 mg (80 mg Subcutaneous Given 03/28/12 1229)  traMADol (ULTRAM) tablet 100 mg (100 mg Oral Given 03/28/12 1229)  acetaminophen (TYLENOL) tablet 650 mg (650 mg Oral Given 03/28/12 1229)  iohexol (OMNIPAQUE) 350 MG/ML injection 100 mL (100 mL Intravenous Contrast Given 03/28/12 1222)   Review of patient's prior records show he was actually discharged from the hospital May 20 when he had his DVT before. He states he only had a 10 day supply of the xeralto, so he has actually been out of his medications probably 2 months.  13:04 Dr Gwenlyn Perking, will talk to patient to decide further treatment whether inpatient or outpatient   *PRELIMINARY RESULTS*  Vascular Ultrasound  Lower extremity venous duplex has been completed. Preliminary findings: Right= No evidence of DVT. Left= Evidence of DVT involving the common femoral, femoral, profunda femoral, and popliteal veins. SVT involving the greater saphenous vein at the origin.  Farrel Demark RDMS  03/28/2012, 10:46 AM        Results for orders placed during the hospital encounter of 03/28/12  CBC WITH DIFFERENTIAL      Component Value Range   WBC 10.5  4.0 - 10.5 K/uL   RBC 3.97 (*) 4.22 - 5.81 MIL/uL   Hemoglobin 13.3  13.0 - 17.0 g/dL   HCT 96.0  45.4 - 09.8 %   MCV 99.2  78.0 - 100.0 fL   MCH 33.5  26.0 - 34.0 pg   MCHC 33.8  30.0 - 36.0 g/dL   RDW 11.9  14.7 - 82.9 %   Platelets 91 (*) 150 - 400 K/uL   Neutrophils Relative 80 (*) 43 - 77 %   Neutro Abs 8.4 (*) 1.7 - 7.7 K/uL   Lymphocytes Relative 11 (*) 12 - 46 %   Lymphs Abs 1.1  0.7 - 4.0 K/uL   Monocytes Relative 9  3 - 12 %   Monocytes  Absolute 0.9  0.1 - 1.0 K/uL   Eosinophils Relative 1  0 - 5 %   Eosinophils Absolute 0.1  0.0 - 0.7 K/uL   Basophils Relative 0  0 - 1 %   Basophils Absolute 0.0  0.0 - 0.1 K/uL  COMPREHENSIVE METABOLIC PANEL      Component Value Range   Sodium 128 (*) 135 - 145 mEq/L   Potassium 3.9  3.5 - 5.1 mEq/L   Chloride 92 (*) 96 - 112  mEq/L   CO2 21  19 - 32 mEq/L   Glucose, Bld 88  70 - 99 mg/dL   BUN 10  6 - 23 mg/dL   Creatinine, Ser 1.61  0.50 - 1.35 mg/dL   Calcium 8.9  8.4 - 09.6 mg/dL   Total Protein 7.3  6.0 - 8.3 g/dL   Albumin 3.5  3.5 - 5.2 g/dL   AST 38 (*) 0 - 37 U/L   ALT 28  0 - 53 U/L   Alkaline Phosphatase 88  39 - 117 U/L   Total Bilirubin 0.8  0.3 - 1.2 mg/dL   GFR calc non Af Amer >90  >90 mL/min   GFR calc Af Amer >90  >90 mL/min  ETHANOL      Component Value Range   Alcohol, Ethyl (B) <11  0 - 11 mg/dL  APTT      Component Value Range   aPTT 35  24 - 37 seconds  PROTIME-INR      Component Value Range   Prothrombin Time 13.2  11.6 - 15.2 seconds   INR 0.98  0.00 - 1.49  URINE RAPID DRUG SCREEN (HOSP PERFORMED)      Component Value Range   Opiates NONE DETECTED  NONE DETECTED   Cocaine NONE DETECTED  NONE DETECTED   Benzodiazepines POSITIVE (*) NONE DETECTED   Amphetamines NONE DETECTED  NONE DETECTED   Tetrahydrocannabinol NONE DETECTED  NONE DETECTED   Barbiturates NONE DETECTED  NONE DETECTED  URINALYSIS, ROUTINE W REFLEX MICROSCOPIC      Component Value Range   Color, Urine AMBER (*) YELLOW   APPearance CLEAR  CLEAR   Specific Gravity, Urine 1.022  1.005 - 1.030   pH 6.0  5.0 - 8.0   Glucose, UA NEGATIVE  NEGATIVE mg/dL   Hgb urine dipstick NEGATIVE  NEGATIVE   Bilirubin Urine MODERATE (*) NEGATIVE   Ketones, ur >80 (*) NEGATIVE mg/dL   Protein, ur NEGATIVE  NEGATIVE mg/dL   Urobilinogen, UA 1.0  0.0 - 1.0 mg/dL   Nitrite NEGATIVE  NEGATIVE   Leukocytes, UA NEGATIVE  NEGATIVE   Laboratory interpretation all normal except hyponatremia, low  chloride, minimal elevation of SGOT     Ct Angio Chest W/cm &/or Wo Cm  03/28/2012  *RADIOLOGY REPORT*  Clinical Data: Shortness of breath. History of left lower extremity pain and swelling.  CT ANGIOGRAPHY CHEST  Technique:  Multidetector CT imaging of the chest using the standard protocol during bolus administration of intravenous contrast. Multiplanar reconstructed images including MIPs were obtained and reviewed to evaluate the vascular anatomy.  Contrast: OMNIPAQUE IOHEXOL 350 MG/ML SOLN  Comparison: 01/22/2012  Findings: There is no evidence for pulmonary embolism.  There is no significant mediastinal, hilar or axillary lymphadenopathy.  There is no significant pericardial or pleural effusion.  There is motion artifact in the lung bases.  Images of the upper abdomen are unremarkable.  Incidentally, there is a common origin from the left common carotid artery and right innominate artery which is a normal anatomic variant.  No evidence for aortic aneurysmal dilatation.  The trachea and mainstem bronchi are patent.  There is a stable 8 mm calcified granuloma in the superior segment of the left lower lobe.  There is a stable linear density along the base of the right lower lobe which is most compatible with area of scarring. Otherwise, the lungs are clear.  There may be stable scarring along the medial aspect the right upper lobe.  No acute bony abnormality. There is an old fracture of the left tenth rib.  IMPRESSION: No evidence for pulmonary embolism.  No acute chest findings.  Original Report Authenticated By: Richarda Overlie, M.D.     2. Left leg DVT   3. Noncompliance     Plan per Dr Skeet Simmer, MD, FACEP   MDM          Ward Givens, MD 03/28/12 1610  Ward Givens, MD 03/28/12 1505

## 2012-03-28 NOTE — ED Notes (Signed)
Per EMS pt c/o shortness of breath since last night, clear lungs, O2 sats 99% RA, pt speaking in full sentences. States he has been off of blood thinner x 1 week. Pt has notable swelling to left lower extremity. Pain to left leg. Pt is homeless. VSS. CBG 94.

## 2012-03-28 NOTE — Progress Notes (Signed)
ANTICOAGULATION CONSULT NOTE - Initial Consult  Pharmacy Consult for Warfarin and Heparin Indication: VTE Treatment (acute/subacute extensive DVT)  No Known Allergies  Patient Measurements: Height: 6\' 2"  (188 cm) Weight: 190 lb 7.6 oz (86.4 kg) IBW/kg (Calculated) : 82.2  Heparin Dosing Weight: 86.4kg  Vital Signs: Temp: 98.2 F (36.8 C) (07/22 1825) Temp src: Oral (07/22 1825) BP: 113/70 mmHg (07/22 1825) Pulse Rate: 85  (07/22 1825)  Labs:  Basename 03/28/12 1001  HGB 13.3  HCT 39.4  PLT 91*  APTT 35  LABPROT 13.2  INR 0.98  HEPARINUNFRC --  CREATININE 0.53  CKTOTAL --  CKMB --  TROPONINI --    Estimated Creatinine Clearance: 134.1 ml/min (by C-G formula based on Cr of 0.53).   Medical History: Past Medical History  Diagnosis Date  . Coronary artery disease   . Degenerative joint disease of spine   . Pulmonary embolism 06/2010  . Degenerative joint disease   . 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 ETOH abuse     Medications:  Scheduled:    . acetaminophen  650 mg Oral Once  . enoxaparin  80 mg Subcutaneous Once  . folic acid  1 mg Oral Daily  . LORazepam  0-4 mg Intravenous Q6H   Followed by  . LORazepam  0-4 mg Intravenous Q12H  . multivitamin with minerals  1 tablet Oral Daily  . pantoprazole  40 mg Oral Q1200  . thiamine  100 mg Oral Daily  . traMADol  100 mg Oral Once   Infusions:    . sodium chloride     PRN: acetaminophen, acetaminophen, albuterol, HYDROmorphone (DILAUDID) injection, iohexol, LORazepam, LORazepam, ondansetron (ZOFRAN) IV, ondansetron, oxyCODONE  Assessment: 46 yo homeless M with hx of lupus anticoagulant disorder, EtOH abuse, and PE s/p IVC filter presented to ER 03/28/12. Pt stated he was diagnosed with a LLE DVT a few weeks ago, was started on Xarelto, but was unable to afford medication (ran out of meds > 3 weeks ago). ER doppler today shows acute/subacute extensive DVT.  Patient endorses that his last alcohol drink  was the night prior to admission. Now to start warfarin and IV heparin for DVT treatment. Pt will need to complete a minimum of 5 day overlap with warfarin/heparin, given acute DVT and non-treatment of previous DVT due to financial situation. Will also need EtOH counseling while on warfarin. Lovenox 80mg  (1mg /kg) x1 given at 12:30pm today in ED. Baseline coags wnl.   Goal of Therapy:  INR 2-3 Heparin level 0.3-0.7 units/ml Monitor platelets by anticoagulation protocol: Yes   Plan:  1) Heparin bolus 4,000 units IV x1 - to begin 12 hours after therapeutic lovenox dose (~1am tomorrow morning) 2) Heparin drip 1400 units/hr (14 ml/hr) after #1 complete 3) Warfarin 10mg  PO x1 tonight 4) Warfarin book and video 5) Warfarin education to be completed prior to discharge 6) Pt will need to complete a minimum of 5 day overlap with warfarin/heparin product AND continue until INR > 2 x2 consecutive days. 7) Labs: Heparin level 6 hours after #1 and #2 started. Daily HL, CBC, PT/INR.  Darrol Angel, PharmD Pager: [phone] 03/28/2012,7:04 PM

## 2012-03-28 NOTE — H&P (Signed)
[facility] History and Physical  Babacar Haycraft QMV:[id number] DOB: [birth year] DOA: 03/28/2012  Referring physician: Dr. Candise Bowens PCP: No primary provider on file.   Chief Complaint: Left lower strandy pain and swelling  HPI:  46 year old male with a past medical history significant for lupus anticoagulant disorder, alcohol abuse, tobacco abuse, chronic degenerative joint disease of his spine and pulmonary embolism in the past status post IVC filter; came into the hospital secondary to left lower extremity swelling and pain, with associated erythema. Patient relates that he was diagnosed with DVT in left lower extremity a few weeks ago and he was placed on xarelto; patient can not pay for the medication and ran out more than 3 weeks ago. In the ED LE dopplers demonstrated acute/subacute extensive DVT. [facility] called to admit patient for further evaluation and treatment.   Patient denies chest pain, shortness of breath, cough, fever/chills, abdominal pain, nausea/vomiting or any other acute complaints. He endorses that his last alcohol drink was the night prior to admission.   Review of Systems:  Negative except as otherwise mentioned on history of present illness.  Past Medical History  Diagnosis Date  . Coronary artery disease   . Degenerative joint disease of spine   . Pulmonary embolism 06/2010  . Degenerative joint disease   . 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 ETOH abuse    Past Surgical History  Procedure Date  . Left elbow surgery   . Tonsillectomy    Social History:  reports that he has been smoking Cigarettes.  He has a 90 pack-year smoking history. He has never used smokeless tobacco. He reports that he drinks alcohol. He reports that he does not use illicit drugs. Patient is homeless and able to perform activities of daily living without any assistance.   No Known Allergies  Family history: Significant for hypertension. Otherwise unremarkable according to the  patient.  Prior to Admission medications   Medication Sig Start Date End Date Taking? Authorizing Provider  Aspirin-Caffeine (BC FAST PAIN RELIEF) 845-65 MG PACK Take 1 Package by mouth every 8 (eight) hours as needed. Pain   Yes Historical Provider, MD  ibuprofen (ADVIL,MOTRIN) 200 MG tablet Take 600 mg by mouth every 6 (six) hours as needed. Pain   Yes Historical Provider, MD  Rivaroxaban (XARELTO) 20 MG TABS Take 1 tablet (20 mg total) by mouth daily. 03/02/12  Yes Nelia Shi, MD   Physical Exam: Filed Vitals:   03/28/12 0944 03/28/12 1559  BP: 133/81 100/54  Pulse: 97 95  Temp: 98.6 F (37 C)   TempSrc: Oral   Resp: 18 16  SpO2: 100% 97%     General:  Alert, awake and oriented x3; no acute distress. Cooperative to examination.  Eyes: PERRLA, extraocular muscles intact, no icterus, no nystagmus.  ENT: Ears without any discharges; nose without evidence of polyps or drainage. Throat without any erythema, thrush or exudates.  Neck: Supple; no thyromegaly  Cardiovascular: Regular rate and rhythm; no murmurs or gallops. No rubs  Respiratory: Positive expiratory wheezing, scattered rhonchi. No crackles.  Abdomen: Soft, nontender/nondistended; positive bowel sounds. No guarding no rebound.  Skin: Erythema of his left lower strandy as a sitter for DVT. Otherwise no rashes or petechiae.  Musculoskeletal: Decreased range of motion on left lower strandy do to significant swelling secondary to DVT. Otherwise no joint swelling or erythema.  Neurologic: Alert, awake and oriented x3; cranial nerves 2-12 grossly intact; no focal motor or sensory deficit appreciated. Muscle strength  Labs  on Admission:  Basic Metabolic Panel:  Lab 03/28/12 7425  NA 128*  K 3.9  CL 92*  CO2 21  GLUCOSE 88  BUN 10  CREATININE 0.53  CALCIUM 8.9  MG --  PHOS --   Liver Function Tests:  Lab 03/28/12 1001  AST 38*  ALT 28  ALKPHOS 88  BILITOT 0.8  PROT 7.3  ALBUMIN 3.5   CBC:  Lab  03/28/12 1001  WBC 10.5  NEUTROABS 8.4*  HGB 13.3  HCT 39.4  MCV 99.2  PLT 91*    BNP (last 3 results)  Basename 01/25/12 1316  PROBNP 75.9    Radiological Exams on Admission: Ct Angio Chest W/cm &/or Wo Cm  03/28/2012  *RADIOLOGY REPORT*  Clinical Data: Shortness of breath. History of left lower extremity pain and swelling.  CT ANGIOGRAPHY CHEST  Technique:  Multidetector CT imaging of the chest using the standard protocol during bolus administration of intravenous contrast. Multiplanar reconstructed images including MIPs were obtained and reviewed to evaluate the vascular anatomy.  Contrast: OMNIPAQUE IOHEXOL 350 MG/ML SOLN  Comparison: 01/22/2012  Findings: There is no evidence for pulmonary embolism.  There is no significant mediastinal, hilar or axillary lymphadenopathy.  There is no significant pericardial or pleural effusion.  There is motion artifact in the lung bases.  Images of the upper abdomen are unremarkable.  Incidentally, there is a common origin from the left common carotid artery and right innominate artery which is a normal anatomic variant.  No evidence for aortic aneurysmal dilatation.  The trachea and mainstem bronchi are patent.  There is a stable 8 mm calcified granuloma in the superior segment of the left lower lobe.  There is a stable linear density along the base of the right lower lobe which is most compatible with area of scarring. Otherwise, the lungs are clear.  There may be stable scarring along the medial aspect the right upper lobe.  No acute bony abnormality. There is an old fracture of the left tenth rib.  IMPRESSION: No evidence for pulmonary embolism.  No acute chest findings.  Original Report Authenticated By: Richarda Overlie, M.D.    Assessment/Plan 1-DVT of lower limb, acute: Left lower strandy Dopplers positive for acute DVT. Patient with history of lupus anticoagulant disorder which will likely is the cause for his quad. At this point will start the  patient on heparin and Coumadin; elevate left lower extremity and provide PRN pain medication.   2-Alcohol abuse: Patient contemplating to stop drinking. Counseling has been provided. Social worker to provide outpatient resources. He endorses DTs in the past. Will start CIWA protocol and also thiamine and folic acid. Will check B12 as well.  3-Tobacco abuse: nicotine patch; counseling provided.  4-Hyponatremia: due to alcohol (chronic) and essentially at baseline. Will give gentle hydration and follow Na trend.  5-Lupus anticoagulant disorder: was supposed to follow with Dr. Gaylyn Rong as an outpatient for further evaluation and treatment. Will treat acutely his DVT and will try to arrange for outpatient follow up at discharge again.  6-GERD: start protonix  7-expiratory wheezing: Most likely associated with a smoking history and undiagnosed COPD. At this point good oxygen saturation and just mild wheezing. Will start when necessary albuterol inhaler.   ZDG:LOVFIEP and coumadin by pharmacy.   Code Status: Full Family Communication: No family members at bedside. Patient was fully competent and plan upper has been discussed with him. All questions has been on her period Disposition Plan: To be determined by social work  assistance since the patient currently homeless.  Time spent: More than 30 minutes  Willow Reczek [facility] Pager [phone]  If 7PM-7AM, please contact night-coverage [email or web address] Password [facility] 03/28/2012, 4:00 PM

## 2012-03-28 NOTE — ED Notes (Signed)
Attempted to call report.  Nurse unavailable. 

## 2012-03-28 NOTE — Progress Notes (Signed)
[facility] ED CM spoke with pt about no pcp and medication needs.  Pt confirmed he is a homeless resident of [place].  [facility] ED CM spoke with pt on 01/25/12 and provided resources for self pay pcps, medication resources & housing resources.  [facility]. Pt is ineligible for [facility] indigent medication assistance per previous notes from [facility] staff.CMspoke with Marchelle Folks at [facility] pharmacy who states pt was last assisted in 06/2010 He has been previously  givingcardsfor medications and applications for medication patient assistance programs which pt states he has not been compliant with Reports having a case worker at DSS but she "did not answer her telephone."  Pending Medicaid ED CM encouraged pt to continue to contact DSS staff.  Pt previously has been assisted to [facility] care but refuses to return to the assisted living CM re reviewed and encouraged pt to obtain a self pay pcp for routine follow up care for his chronic illness "blood clots" Provided with list of accepting self pay providers.  Discussed cost and billing processes for care Pt reports being familiar with [facility] (interactive resource center) that assists homeless with housing, ADL needs, computer access, MH assistance and medication/pcp resources.  Encouraged pt to go back to [facility] Provided DSS contact information, [employer] + its toll free number, local financial resources/churches Pt has already been seen by various [facility] SW and CM staff.  [place]  Discussed the importance of compliance with medication (coumadin, lovenox or Xarelto-pt familiar with all of these) and routine/maintainance self pay pcp care.  Pt voiced understanding and appreciation of resources offered

## 2012-03-28 NOTE — Progress Notes (Signed)
ED CM spoke with pt about review of previous notes during other admission and ED visits & possible transitional indigent [facility] medication services and encouraged pt to follow up with all resources offered by [facility] CMs and SWs He stated he would follow up.

## 2012-03-28 NOTE — Progress Notes (Signed)
*  PRELIMINARY RESULTS* Vascular Ultrasound Lower extremity venous duplex has been completed.  Preliminary findings: Right= No evidence of DVT. Left= Evidence of DVT involving the common femoral, femoral, profunda femoral, and popliteal veins. SVT involving the greater saphenous vein at the origin.  Riley Lam, Patsey Pitstick RDMS 03/28/2012, 10:46 AM

## 2012-03-29 LAB — TSH: TSH: 2.006 u[IU]/mL (ref 0.350–4.500)

## 2012-03-29 LAB — CBC
Hemoglobin: 12.3 g/dL — ABNORMAL LOW (ref 13.0–17.0)
MCH: 33.3 pg (ref 26.0–34.0)
MCV: 99.5 fL (ref 78.0–100.0)
Platelets: 110 10*3/uL — ABNORMAL LOW (ref 150–400)
RBC: 3.69 MIL/uL — ABNORMAL LOW (ref 4.22–5.81)
WBC: 7.8 10*3/uL (ref 4.0–10.5)

## 2012-03-29 LAB — BASIC METABOLIC PANEL
CO2: 25 mEq/L (ref 19–32)
Chloride: 99 mEq/L (ref 96–112)
Glucose, Bld: 84 mg/dL (ref 70–99)
Potassium: 3.7 mEq/L (ref 3.5–5.1)
Sodium: 132 mEq/L — ABNORMAL LOW (ref 135–145)

## 2012-03-29 LAB — PROTIME-INR
INR: 0.95 (ref 0.00–1.49)
Prothrombin Time: 12.9 seconds (ref 11.6–15.2)

## 2012-03-29 LAB — HEPARIN LEVEL (UNFRACTIONATED): Heparin Unfractionated: 0.43 IU/mL (ref 0.30–0.70)

## 2012-03-29 MED ORDER — HEPARIN BOLUS VIA INFUSION
2000.0000 [IU] | INTRAVENOUS | Status: AC
  Filled 2012-03-29: qty 2000

## 2012-03-29 MED ORDER — VITAMIN B-12 1000 MCG PO TABS
1000.0000 ug | ORAL_TABLET | Freq: Every day | ORAL | Status: DC
  Administered 2012-03-29 – 2012-04-07 (×10): 1000 ug via ORAL
  Filled 2012-03-29 (×11): qty 1

## 2012-03-29 MED ORDER — DIPHENHYDRAMINE HCL 25 MG PO CAPS
25.0000 mg | ORAL_CAPSULE | Freq: Three times a day (TID) | ORAL | Status: DC | PRN
  Administered 2012-03-29 – 2012-03-31 (×3): 25 mg via ORAL
  Filled 2012-03-29 (×3): qty 1

## 2012-03-29 MED ORDER — HEPARIN (PORCINE) IN NACL 100-0.45 UNIT/ML-% IJ SOLN
1950.0000 [IU]/h | INTRAMUSCULAR | Status: DC
Start: 2012-03-29 — End: 2012-03-31
  Administered 2012-03-29: 1750 [IU]/h via INTRAVENOUS
  Administered 2012-03-29: 2000 [IU]/h via INTRAVENOUS
  Administered 2012-03-30 (×2): 1750 [IU]/h via INTRAVENOUS
  Filled 2012-03-29 (×5): qty 250

## 2012-03-29 MED ORDER — WARFARIN SODIUM 10 MG PO TABS
10.0000 mg | ORAL_TABLET | Freq: Once | ORAL | Status: AC
  Administered 2012-03-29: 10 mg via ORAL
  Filled 2012-03-29: qty 1

## 2012-03-29 NOTE — Care Management Note (Signed)
    Page 1 of 2   04/07/2012     11:51:41 AM   CARE MANAGEMENT NOTE 04/07/2012  Patient:  [name]   Account Number:  [id number]  Date Initiated:  03/29/2012  Documentation initiated by:  CRAFT,TERRI  Subjective/Objective Assessment:   46 yo male admitted 03/28/12 with SOB     Action/Plan:   D/C when medically stable   Anticipated DC Date:  04/10/2012   Anticipated DC Plan:  HOME/SELF CARE  In-house referral  Clinical Educational psychologist      DC Planning Services  CM consult  Medication Assistance      [employer] Choice  DURABLE MEDICAL EQUIPMENT   Choice offered to / List presented to:     DME arranged  [facility]      DME agency  [facility]        Status of service:  Completed, signed off  Discharge Disposition:  HOME/SELF CARE  Per UR Regulation:  Reviewed for med. necessity/level of care/duration of stay  Comments:  04/07/12, Kathi Der RNC-MNN, BSN, [phone].  Per [facility] pt declined RW.  Per CSW, Pt  will be discharged to [facility].  He will go to [facility] during the day.  Pt has appt. on Monday [facility] with Dr. Gaylyn Rong for follow up. 04/06/12, Kathi Der RNC-MNN, BSN, [phone], Per CSW, pt not accepted to [facility] due to pending court dates.   CSW still evalusting for safe discharge plan.  Psych consult for pt has been ordered.  Pt Pt remains on Coumadin and is starting Lovenox today.  [facility] contacted with DME order.  Per [place], pt will have to purchase cane.  RW to be delivered to room.  CM continuing to follow for d/c plan.  Awaiting Psych concult, Department DD and AD aware. 04/01/12, Kathi Der RNC-MNN, BSN, [phone], CSW currently working with pt.  Pt remains on Heparin drip and IV pain meds. 03/29/12, Kathi Der RNC-MNN, BSN, [phone], CM received referral.  [facility] met with pt and provided resources for medications as well as PCP.  Pt is eligible for indigent funds at discharge.  Pt has a history of non compliance. Has been  provided with resources for medications and physician follow up in the past.  Spoke with Melissa Montane, Financial Counselor, who states pt has Medicaid and Disability pending.  Per Jasmine December, pt non compliant with follow up on appts for approval of these items.  Jasmine December will follow up with pt while inpatient.

## 2012-03-29 NOTE — Progress Notes (Signed)
ANTICOAGULATION CONSULT NOTE - Initial Consult  Pharmacy Consult for Warfarin and Heparin Indication: VTE Treatment (acute/subacute extensive DVT)  No Known Allergies  Patient Measurements: Height: 6\' 2"  (188 cm) Weight: 190 lb 7.6 oz (86.4 kg) IBW/kg (Calculated) : 82.2  Heparin Dosing Weight: 86.4kg  Vital Signs: Temp: 98.3 F (36.8 C) (07/23 0541) Temp src: Oral (07/23 0541) BP: 105/65 mmHg (07/23 0541) Pulse Rate: 73  (07/23 0541)  Labs:  Basename 03/29/12 0709 03/28/12 1001  HGB 12.3* 13.3  HCT 36.7* 39.4  PLT 110* 91*  APTT -- 35  LABPROT 12.9 13.2  INR 0.95 0.98  HEPARINUNFRC 0.23* --  CREATININE 0.54 0.53  CKTOTAL -- --  CKMB -- --  TROPONINI -- --    Estimated Creatinine Clearance: 134.1 ml/min (by C-G formula based on Cr of 0.54).   Medical History: Past Medical History  Diagnosis Date  . Coronary artery disease   . Degenerative joint disease of spine   . Pulmonary embolism 06/2010  . Degenerative joint disease   . 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 ETOH abuse     Medications:  Scheduled:     . acetaminophen  650 mg Oral Once  . coumadin book   Does not apply Once  . enoxaparin  80 mg Subcutaneous Once  . folic acid  1 mg Oral Daily  . heparin  4,000 Units Intravenous Once  . LORazepam  0-4 mg Intravenous Q6H   Followed by  . LORazepam  0-4 mg Intravenous Q12H  . multivitamin with minerals  1 tablet Oral Daily  . nicotine  21 mg Transdermal Daily  . pantoprazole  40 mg Oral Q1200  . thiamine  100 mg Oral Daily  . traMADol  100 mg Oral Once  . warfarin  10 mg Oral Once  . warfarin   Does not apply Once  . Warfarin - Pharmacist Dosing Inpatient   Does not apply q1800   Infusions:     . sodium chloride 50 mL/hr at 03/28/12 1958  . heparin 1,400 Units/hr (03/29/12 0100)   PRN: acetaminophen, acetaminophen, albuterol, HYDROmorphone (DILAUDID) injection, iohexol, LORazepam, LORazepam, ondansetron (ZOFRAN) IV, ondansetron,  oxyCODONE  Inpatient warfarin doses - 7/22: 10mg   Assessment:  46 yo homeless M with hx of lupus anticoagulant disorder, EtOH abuse, and PE s/p IVC filter presented to ER 03/28/12. Pt stated he was diagnosed with a LLE DVT a few weeks ago, was started on Xarelto, but was unable to afford medication (ran out of meds > 3 weeks ago). ER doppler showed acute/subacute extensive DVT.  Patient indicates his most recent alcoholic beverage was ingested the night prior to admission. Heparin and warfarin were started for DVT treatment 03/28/12 after Lovenox 80mg  x1 in ED.  Pt will need to complete a minimum of 5 day overlap with warfarin/heparin, given acute DVT and non-treatment of previous DVT due to financial situation. Will also need EtOH counseling while on warfarin.  Heparin level subtherapeutic  Warfarin initiation in progress  Thrombocytopenic at baseline in setting of slight AST elevation and EtOH use.   Pltc improved today.   Goal of Therapy:  INR 2-3 Heparin level 0.3-0.7 units/ml Monitor platelets by anticoagulation protocol: Yes   Plan:  1) Re-bolus with heparin 2000 units IV x 1 2) Increase heparin infusion to 1750 units/hr 3) Recheck heparin level at 3pm. 4) Repeat warfarin 10mg  PO x 1 tonight 5) PT/INR daily 6) Warfarin education to be completed prior to discharge 7) Needs heparin / warfarin overlap  for at least 5 days and until INR>=2 on two consecutive days. Hope Budds, PharmD, BCPS Pager: [phone] 03/29/2012,8:13 AM

## 2012-03-29 NOTE — Progress Notes (Signed)
ANTICOAGULATION CONSULT NOTE - Follow Up Consult  Pharmacy Consult for Heparin Indication: VTE Treatment (acute/subacute extensive DVT  No Known Allergies  Patient Measurements: Height: 6\' 2"  (188 cm) Weight: 190 lb 7.6 oz (86.4 kg) IBW/kg (Calculated) : 82.2  Heparin Dosing Weight: 86.4kg  Vital Signs: Temp: 98 F (36.7 C) (07/23 1330) Temp src: Oral (07/23 1330) BP: 130/81 mmHg (07/23 1330) Pulse Rate: 74  (07/23 1330)  Labs:  Basename 03/29/12 1516 03/29/12 0709 03/28/12 1001  HGB -- 12.3* 13.3  HCT -- 36.7* 39.4  PLT -- 110* 91*  APTT -- -- 35  LABPROT -- 12.9 13.2  INR -- 0.95 0.98  HEPARINUNFRC 0.43 0.23* --  CREATININE -- 0.54 0.53  CKTOTAL -- -- --  CKMB -- -- --  TROPONINI -- -- --    Estimated Creatinine Clearance: 134.1 ml/min (by C-G formula based on Cr of 0.54).   Medications:  Scheduled:    . coumadin book   Does not apply Once  . folic acid  1 mg Oral Daily  . heparin  2,000 Units Intravenous NOW  . heparin  4,000 Units Intravenous Once  . LORazepam  0-4 mg Intravenous Q6H   Followed by  . LORazepam  0-4 mg Intravenous Q12H  . multivitamin with minerals  1 tablet Oral Daily  . nicotine  21 mg Transdermal Daily  . pantoprazole  40 mg Oral Q1200  . thiamine  100 mg Oral Daily  . warfarin  10 mg Oral Once  . warfarin  10 mg Oral ONCE-1800  . warfarin   Does not apply Once  . Warfarin - Pharmacist Dosing Inpatient   Does not apply q1800   Infusions:    . sodium chloride 50 mL/hr at 03/28/12 1958  . heparin 1,750 Units/hr (03/29/12 1256)  . DISCONTD: heparin 1,400 Units/hr (03/29/12 0100)   PRN: acetaminophen, acetaminophen, albuterol, diphenhydrAMINE, HYDROmorphone (DILAUDID) injection, LORazepam, LORazepam, ondansetron (ZOFRAN) IV, ondansetron, oxyCODONE  Assessment  46 yo homeless M with hx of lupus anticoagulant disorder, EtOH abuse, and PE s/p IVC filter presented to ER 03/28/12. Pt stated he was diagnosed with a LLE DVT a few weeks  ago, was started on Xarelto, but was unable to afford medication (ran out of meds > 3 weeks ago). ER doppler showed acute/subacute extensive DVT. Patient indicates his most recent alcoholic beverage was ingested the night prior to admission. Heparin and warfarin were started for DVT treatment 03/28/12 after Lovenox 80mg  x1 in ED. Heparin level =0.43 Warfarin initiation in progress  Thrombocytopenic at baseline in setting of slight AST elevation and EtOH use. Pltc improved today. Goal of Therapy:  Heparin level 0.3-0.7 units/ml Monitor platelets by anticoagulation protocol: Yes   Plan:  1) Continue Heparin at present rate 1750 units/hour 2) Recheck heparin level in AM   [name] 03/29/2012,4:18 PM

## 2012-03-29 NOTE — Progress Notes (Signed)
[facility] PROGRESS NOTE  Derwin Reddy ZOX:[id number] DOB: September 03, 1966 DOA: 03/28/2012 PCP: No primary provider on file.  Assessment/Plan: 1-DVT of left lower limb, acute: Left lower extremety Dopplers positive for acute DVT. Patient with history of lupus anticoagulant disorder which is likely the cause for his leg clot. Continue bridging therapy with heparin and coumadin; protocol per pharmacy. Continue PRN pain meds and elevate legs.   2-Alcohol abuse: Patient contemplating to stop drinking. Counseling has been provided. Social worker to provide outpatient resources. He endorses DTs in the past. Will continue CIWA protocol and also thiamine and folic acid. B12 242; borderline normal; probably due to alcohol as well. Will start PO maintenance supplementation.   3-Tobacco abuse: nicotine patch; counseling provided.   4-Hyponatremia: due to alcohol (chronic) and essentially at baseline. Will give gentle hydration and follow Na; today 132.   5-Lupus anticoagulant disorder: was supposed to follow with Dr. Gaylyn Rong as an outpatient for further evaluation and treatment. Will treat acutely his DVT and will required arrangements for outpatient follow up at discharge again.   6-GERD: start protonix   Code Status: Full code Family Communication: No family member at bedside. Patient competent, plan of care discussed in details with him. Disposition Plan: The patient is homeless; Child psychotherapist to assist with final discharge plans.   Brief narrative: 46 year old male with a past medical history significant for lupus anticoagulant disorder, alcohol abuse, tobacco abuse, chronic degenerative joint disease of his spine and pulmonary embolism in the past status post IVC filter; came into the hospital secondary to left lower extremity swelling and pain, with associated erythema. Patient relates that he was diagnosed with DVT in left lower extremity a few weeks ago and he was placed on xarelto; patient can not  pay for the medication and ran out more than 3 weeks ago. In the ED LE dopplers demonstrated acute/subacute extensive DVT   Consultants:  Pharmacy for coumadin and heparin therapy  Procedures:  Left lower strandy Dopplers: - Findings consistent with deep vein thrombosis involving the left common femoral vein, left profunda femoris vein, left femoral vein, and left popliteal vein. - Superficial vein thrombosis involving the leftgreater saphenous vein.  Antibiotics:  None  HPI/Subjective: Patient reports less pain on his left lower strandy; afebrile. Denies chest pain or shortness of breath. He endorses mild shaking overnight that has been resolved with the use of Ativan.  Objective: Filed Vitals:   03/29/12 0356 03/29/12 0541 03/29/12 0900 03/29/12 1330  BP: 111/71 105/65 110/70 130/81  Pulse: 80 73 70 74  Temp:  98.3 F (36.8 C)  98 F (36.7 C)  TempSrc:  Oral  Oral  Resp:  16  18  Height:      Weight:      SpO2:  95%  96%    Intake/Output Summary (Last 24 hours) at 03/29/12 1610 Last data filed at 03/29/12 1300  Gross per 24 hour  Intake    759 ml  Output   1000 ml  Net   -241 ml    Exam:   General:  No acute distress, afebrile.  Cardiovascular: Regular rate and rhythm, no murmurs or rubs  Respiratory: Good air movement, no wheezing or crackles.  Abdomen: Soft, mild epigastric tenderness, no guarding, no rebound; positive bowel sounds  Neurologic: Alert, awake and oriented x3, cranial nerve 2-12 grossly intact. Nonfocal motor or sensory deficit appreciated on exam.  Musculoskeletal: No joint erythema or swelling.  Data Reviewed: Basic Metabolic Panel:  Lab 03/29/12 8119 03/28/12  1900 03/28/12 1001  NA 132* -- 128*  K 3.7 -- 3.9  CL 99 -- 92*  CO2 25 -- 21  GLUCOSE 84 -- 88  BUN 7 -- 10  CREATININE 0.54 -- 0.53  CALCIUM 8.2* -- 8.9  MG -- 1.9 --  PHOS -- 2.8 --   Liver Function Tests:  Lab 03/28/12 1001  AST 38*  ALT 28  ALKPHOS 88    BILITOT 0.8  PROT 7.3  ALBUMIN 3.5   CBC:  Lab 03/29/12 0709 03/28/12 1001  WBC 7.8 10.5  NEUTROABS -- 8.4*  HGB 12.3* 13.3  HCT 36.7* 39.4  MCV 99.5 99.2  PLT 110* 91*   BNP (last 3 results)  Basename 01/25/12 1316  PROBNP 75.9     Studies: Ct Angio Chest W/cm &/or Wo Cm  03/28/2012  *RADIOLOGY REPORT*  Clinical Data: Shortness of breath. History of left lower extremity pain and swelling.  CT ANGIOGRAPHY CHEST  Technique:  Multidetector CT imaging of the chest using the standard protocol during bolus administration of intravenous contrast. Multiplanar reconstructed images including MIPs were obtained and reviewed to evaluate the vascular anatomy.  Contrast: OMNIPAQUE IOHEXOL 350 MG/ML SOLN  Comparison: 01/22/2012  Findings: There is no evidence for pulmonary embolism.  There is no significant mediastinal, hilar or axillary lymphadenopathy.  There is no significant pericardial or pleural effusion.  There is motion artifact in the lung bases.  Images of the upper abdomen are unremarkable.  Incidentally, there is a common origin from the left common carotid artery and right innominate artery which is a normal anatomic variant.  No evidence for aortic aneurysmal dilatation.  The trachea and mainstem bronchi are patent.  There is a stable 8 mm calcified granuloma in the superior segment of the left lower lobe.  There is a stable linear density along the base of the right lower lobe which is most compatible with area of scarring. Otherwise, the lungs are clear.  There may be stable scarring along the medial aspect the right upper lobe.  No acute bony abnormality. There is an old fracture of the left tenth rib.  IMPRESSION: No evidence for pulmonary embolism.  No acute chest findings.  Original Report Authenticated By: Richarda Overlie, M.D.    Scheduled Meds:   . coumadin book   Does not apply Once  . folic acid  1 mg Oral Daily  . heparin  2,000 Units Intravenous NOW  . heparin  4,000  Units Intravenous Once  . LORazepam  0-4 mg Intravenous Q6H   Followed by  . LORazepam  0-4 mg Intravenous Q12H  . multivitamin with minerals  1 tablet Oral Daily  . nicotine  21 mg Transdermal Daily  . pantoprazole  40 mg Oral Q1200  . thiamine  100 mg Oral Daily  . warfarin  10 mg Oral Once  . warfarin  10 mg Oral ONCE-1800  . warfarin   Does not apply Once  . Warfarin - Pharmacist Dosing Inpatient   Does not apply q1800   Continuous Infusions:   . sodium chloride 50 mL/hr at 03/28/12 1958  . heparin 1,750 Units/hr (03/29/12 1256)  . DISCONTD: heparin 1,400 Units/hr (03/29/12 0100)    Time spent: >30 minutes    Udell Mazzocco  [facility] Pager [phone]. If 8PM-8AM, please contact night-coverage at [email or web address], password [email or web address] 03/29/2012, 4:10 PM  LOS: 1 day

## 2012-03-30 DIAGNOSIS — D649 Anemia, unspecified: Secondary | ICD-10-CM

## 2012-03-30 DIAGNOSIS — E876 Hypokalemia: Secondary | ICD-10-CM

## 2012-03-30 LAB — CBC
MCH: 33.4 pg (ref 26.0–34.0)
MCV: 100.3 fL — ABNORMAL HIGH (ref 78.0–100.0)
Platelets: 140 10*3/uL — ABNORMAL LOW (ref 150–400)
RBC: 3.74 MIL/uL — ABNORMAL LOW (ref 4.22–5.81)
RDW: 14.9 % (ref 11.5–15.5)
WBC: 7.3 10*3/uL (ref 4.0–10.5)

## 2012-03-30 LAB — PROTIME-INR: Prothrombin Time: 12.2 seconds (ref 11.6–15.2)

## 2012-03-30 MED ORDER — DOCUSATE SODIUM 100 MG PO CAPS
100.0000 mg | ORAL_CAPSULE | Freq: Two times a day (BID) | ORAL | Status: DC
  Administered 2012-03-30 – 2012-04-07 (×17): 100 mg via ORAL
  Filled 2012-03-30 (×22): qty 1

## 2012-03-30 MED ORDER — WARFARIN SODIUM 2.5 MG PO TABS
12.5000 mg | ORAL_TABLET | Freq: Once | ORAL | Status: AC
  Administered 2012-03-30: 12.5 mg via ORAL
  Filled 2012-03-30: qty 1

## 2012-03-30 NOTE — Progress Notes (Signed)
Clinical Social Work Department BRIEF PSYCHOSOCIAL ASSESSMENT 03/30/2012  Patient:  [name],[name]     Account Number:  [id number]     Admit date:  03/28/2012  Clinical Social Worker:  Leron Croak, CLINICAL SOCIAL WORKER  Date/Time:  03/30/2012 03:04 PM  Referred by:  Physician  Date Referred:  03/30/2012 Referred for  Substance Abuse   Other Referral:   Interview type:  Patient Other interview type:    PSYCHOSOCIAL DATA Living Status:  OTHER Admitted from facility:   Level of care:   Primary support name:  none Primary support relationship to patient:   Degree of support available:   poor    CURRENT CONCERNS Current Concerns  Substance Abuse  Other - See comment   Other Concerns:   Pt is currently Homeless [place]    SOCIAL WORK ASSESSMENT / PLAN CSW met with the Pt to discuss d/c planning and the Pt's substance abuse concerns.   Assessment/plan status:  Information/Referral to [facility] Other assessment/ plan:   Information/referral to community resources:   CSW provided resources for shelters, drug rehabilitation, financial services, and mental health out patient programs.    PATIENT'S/FAMILY'S RESPONSE TO PLAN OF CARE: Pt was appreciative for provided information and verbalizes motivation for abstinense  from alcohol and a desire to enter into an in-patient program in order to move forward with his recovery.      Leron Croak, LCSWA [employer] [phone]

## 2012-03-30 NOTE — Progress Notes (Signed)
Staff was called to the room.  Patient found standing, with IV pulled out and blood leaking all over the floor.  One IV site (to R Hand) remained patent and usable.  Heparin drip switched to this hand.  Patient had earlier indicated that he would call for assistance when using the bathroom, but said he "forgot."  Patient's linens changed, and patient bathed.  Bed alarm to be employed hereafter.  Will continue to monitor.

## 2012-03-30 NOTE — Progress Notes (Signed)
Patient ID: [name], male   DOB: 25-Aug-1966, 46 y.o.   MRN: [id number]  [facility] PROGRESS NOTE  Veasna Santibanez WUJ:[id number] DOB: [birth year] DOA: 03/28/2012 PCP: No primary provider on file.  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 Patient relates that he was diagnosed with DVT in left lower extremity a few weeks prior to this admission and he was placed on xarelto; patient can not pay for the medication and ran out more than 3 weeks ago. In the ED LE dopplers demonstrated acute/subacute extensive DVT.   Principal Problem:  *DVT of lower limb, acute - dopplers positive for acute DVT in pt with lupus anticoagulant disorder - we are continuing bridging therapy with coumadin as pt unable to afford Xarelto - patient reports current alcohol abuse and this presents problem as he would be considered a high risk to be on anticoagulants due to risk on non compliance; this was communicated to the patient and he asked if we can help him with alcohol rehab; I placed order for SW for possible rehab (outpatien or inpatient)  - continue supportive care with analgesia as needed for pain control   Active Problems:  Alcohol abuse - consulted on cessation of alcohol as well as tobacco - will also obtain SW consult for counseling - pt was made aware if he is drinking actively he is at higher risk of fall and also would not be considered coumadin candidate - pt verbalized understanding and desires to also speak with SW   Tobacco abuse - consulted on cessation - pt also made aware of higher risk of blood clots if continues to smoke - he has verbalized understanding - continue nicotine patch   Hyponatremia - sodium remains stable and at pt's baseline   Lupus anticoagulant disorder - pt was supposed to follow up with Dr. Gaylyn Rong upon discharge  Code Status: Full code  Family Communication: No family member at bedside. Patient competent, plan of care discussed in details with him.  Disposition Plan: The patient is homeless; Child psychotherapist to assist with final discharge plans.   Consultants:  Pharmacy for coumadin and heparin therapy  Procedures/Studies:  Ct Angio Chest W/cm &/or Wo Cm - 03/28/2012     IMPRESSION:   No evidence for pulmonary embolism.  No acute chest findings.    Left lower strandy Dopplers:  DVT involving the left common femoral vein, left profunda femoris vein, left femoral vein, and left popliteal vein.  Superficial vein thrombosis involving the leftgreatersaphenous vein.   HPI/Subjective: No events overnight. Pt denies shortness of breath and reports pain is better controlled.  Objective: Filed Vitals:   03/29/12 2040 03/29/12 2156 03/30/12 0346 03/30/12 0505  BP: 129/80 132/83 121/78 132/78  Pulse: 83 89 87 75  Temp: 98.4 F (36.9 C)   98.6 F (37 C)  TempSrc: Oral   Oral  Resp: 17   17  Height:      Weight:      SpO2: 98%   95%    Intake/Output Summary (Last 24 hours) at 03/30/12 1035 Last data filed at 03/30/12 1003  Gross per 24 hour  Intake   2720 ml  Output   1650 ml  Net   1070 ml    Exam:  General:  Pt is alert, follows commands appropriately, not in acute distress  Cardiovascular: Regular rate and rhythm, S1/S2, no murmurs, no rubs, no gallops  Respiratory: Clear to auscultation bilaterally, no wheezing, no crackles, no rhonchi  Abdomen: Soft, non tender, non distended, bowel sounds present, no guarding  Extremities: No edema, pulses DP and PT palpable bilaterally  Neuro: Grossly nonfocal  Data Reviewed: Basic Metabolic Panel:  Lab 03/29/12 1610 03/28/12 1900 03/28/12 1001  NA 132* -- 128*  K 3.7 -- 3.9  CL 99 --  92*  CO2 25 -- 21  GLUCOSE 84 -- 88  BUN 7 -- 10  CREATININE 0.54 -- 0.53  CALCIUM 8.2* -- 8.9  MG -- 1.9 --  PHOS -- 2.8 --   Liver Function Tests:  Lab 03/28/12 1001  AST 38*  ALT 28  ALKPHOS 88  BILITOT 0.8  PROT 7.3  ALBUMIN 3.5   CBC:  Lab 03/30/12 0330 03/29/12 0709 03/28/12 1001  WBC 7.3 7.8 10.5  NEUTROABS -- -- 8.4*  HGB 12.5* 12.3* 13.3  HCT 37.5* 36.7* 39.4  MCV 100.3* 99.5 99.2  PLT 140* 110* 91*    Scheduled Meds:   . docusate sodium  100 mg Oral BID  . folic acid  1 mg Oral Daily  . heparin  2,000 Units Intravenous NOW  . LORazepam  0-4 mg Intravenous Q6H  . multivitamin   1 tablet Oral Daily  . nicotine  21 mg Transdermal Daily  . pantoprazole  40 mg Oral Q1200  . thiamine  100 mg Oral Daily  . vitamin B-12  1,000 mcg Oral Daily  . warfarin  10 mg Oral ONCE-1800  . warfarin  12.5 mg Oral Once   Continuous Infusions:   . sodium chloride 50 mL/hr at 03/29/12 1944  . heparin 1,750 Units/hr (03/30/12 0344)     Manson Passey, MD  [facility] Pager [phone]  If 7PM-7AM, please contact night-coverage [email or web address] Password [email or web address] 03/30/2012, 10:35 AM   LOS: 2 days

## 2012-03-30 NOTE — Progress Notes (Signed)
CSW met with Pt to assess for d/c planning and substance abuse Tx.   Pt is agreeable to inpatient Tx and gave verbal understanding that his "drinking" is putting his health at risk. Pt believes he will not be able to remain sober if he has to live out on the "street".   CSW provided multiple resources (to include financial assistance, recovery programs, mental health out patient programs and shelter information.   Pt is not agreeable to placement at the [facility] at this time.   CSW to follow.   Leron Croak, LCSWA [employer] [phone]

## 2012-03-30 NOTE — Plan of Care (Signed)
Problem: Phase I Progression Outcomes Goal: Initial discharge plan identified Outcome: Completed/Met Date Met:  03/30/12 Patient is agreeable to rehab for alcohol abuse; list of facilities given.  Patient probably needs assistance calling to facilities.  He attempted to call 2 today but did not receive return phone calls.  Will continue to monitor.

## 2012-03-30 NOTE — Progress Notes (Signed)
CSW met with Pt to discuss residential Tx programs. Pt is very motivated toward recovery ans has been calling different facilities to gain information about their program and admission requirements.   CSW will continue to follow.     Leron Croak, LCSWA [employer] [phone]

## 2012-03-30 NOTE — Progress Notes (Signed)
ANTICOAGULATION CONSULT NOTE - follow up  Pharmacy Consult for Warfarin and Heparin Indication: VTE Treatment (acute/subacute extensive DVT)  No Known Allergies  Patient Measurements: Height: 6\' 2"  (188 cm) Weight: 190 lb 7.6 oz (86.4 kg) IBW/kg (Calculated) : 82.2  Heparin Dosing Weight: 86.4kg  Vital Signs: Temp: 98.6 F (37 C) (07/24 0505) Temp src: Oral (07/24 0505) BP: 132/78 mmHg (07/24 0505) Pulse Rate: 75  (07/24 0505)  Labs:  Basename 03/30/12 0330 03/29/12 1516 03/29/12 0709 03/28/12 1001  HGB 12.5* -- 12.3* --  HCT 37.5* -- 36.7* 39.4  PLT 140* -- 110* 91*  APTT -- -- -- 35  LABPROT 12.2 -- 12.9 13.2  INR 0.89 -- 0.95 0.98  HEPARINUNFRC 0.34 0.43 0.23* --  CREATININE -- -- 0.54 0.53  CKTOTAL -- -- -- --  CKMB -- -- -- --  TROPONINI -- -- -- --    Estimated Creatinine Clearance: 134.1 ml/min (by C-G formula based on Cr of 0.54).   Medical History: Past Medical History  Diagnosis Date  . Coronary artery disease   . Degenerative joint disease of spine   . Pulmonary embolism 06/2010  . Degenerative joint disease   . 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 ETOH abuse     Medications:  Scheduled:     . folic acid  1 mg Oral Daily  . heparin  2,000 Units Intravenous NOW  . LORazepam  0-4 mg Intravenous Q6H   Followed by  . LORazepam  0-4 mg Intravenous Q12H  . multivitamin with minerals  1 tablet Oral Daily  . nicotine  21 mg Transdermal Daily  . pantoprazole  40 mg Oral Q1200  . thiamine  100 mg Oral Daily  . vitamin B-12  1,000 mcg Oral Daily  . warfarin  10 mg Oral ONCE-1800  . warfarin   Does not apply Once  . Warfarin - Pharmacist Dosing Inpatient   Does not apply q1800   Infusions:     . sodium chloride 50 mL/hr at 03/29/12 1944  . heparin 1,750 Units/hr (03/30/12 0344)  . DISCONTD: heparin 1,400 Units/hr (03/29/12 0100)   PRN: acetaminophen, acetaminophen, albuterol, diphenhydrAMINE, HYDROmorphone (DILAUDID) injection, LORazepam,  LORazepam, ondansetron (ZOFRAN) IV, ondansetron, oxyCODONE  Inpatient warfarin doses from 7/22: 10, 10mg   Assessment:  46 yo homeless M with hx of lupus anticoagulant disorder, EtOH abuse, and PE s/p IVC filter presented to ER 03/28/12. Pt stated he was diagnosed with a LLE DVT a few weeks ago, was started on Xarelto, but was unable to afford medication (ran out of meds > 3 weeks ago). ER doppler showed acute/subacute extensive DVT.  Patient indicates his most recent alcoholic beverage was ingested the night prior to admission. Heparin and warfarin were started for DVT treatment 03/28/12 after Lovenox 80mg  x1 in ED.  Overlap day 3 of 5 day minimum given acute DVT and non-treatment of previous DVT due to financial situation. Will also need EtOH counseling while on warfarin.  Heparin level in therapeutic range.  INR hasn't responded yet to 10mg  x 2. Doses charted as given.  Thrombocytopenic at baseline in setting of slight AST elevation and EtOH use but platelets are improve.  Goal of Therapy:  INR 2-3 Heparin level 0.3-0.7 units/ml Monitor platelets by anticoagulation protocol: Yes   Plan:  1) Cont heparin infusion at 1750 units/hr. 2) Give Coumadin 12.5mg  early today. 3) Heparin level and PT/INR daily. 6) Warfarin education to be completed prior to discharge. 7) Needs heparin / warfarin overlap for at  least 5 days and until INR>=2 on two consecutive days.  Charolotte Eke, PharmD, pager [phone]. 03/30/2012,7:13 AM.

## 2012-03-31 LAB — CBC
HCT: 38.7 % — ABNORMAL LOW (ref 39.0–52.0)
MCHC: 33.3 g/dL (ref 30.0–36.0)
MCV: 98.7 fL (ref 78.0–100.0)
Platelets: 209 10*3/uL (ref 150–400)
RDW: 14.6 % (ref 11.5–15.5)
WBC: 6.9 10*3/uL (ref 4.0–10.5)

## 2012-03-31 LAB — PROTIME-INR
INR: 1.06 (ref 0.00–1.49)
Prothrombin Time: 14 seconds (ref 11.6–15.2)

## 2012-03-31 LAB — HEPARIN LEVEL (UNFRACTIONATED)
Heparin Unfractionated: 0.22 IU/mL — ABNORMAL LOW (ref 0.30–0.70)
Heparin Unfractionated: 0.48 IU/mL (ref 0.30–0.70)

## 2012-03-31 MED ORDER — HEPARIN (PORCINE) IN NACL 100-0.45 UNIT/ML-% IJ SOLN
1950.0000 [IU]/h | INTRAMUSCULAR | Status: DC
  Administered 2012-03-31 (×2): 1950 [IU]/h via INTRAVENOUS
  Filled 2012-03-31 (×3): qty 250

## 2012-03-31 MED ORDER — WARFARIN SODIUM 2.5 MG PO TABS
12.5000 mg | ORAL_TABLET | Freq: Once | ORAL | Status: AC
  Administered 2012-03-31: 12.5 mg via ORAL
  Filled 2012-03-31: qty 1

## 2012-03-31 MED ORDER — LORAZEPAM 0.5 MG PO TABS
0.5000 mg | ORAL_TABLET | Freq: Three times a day (TID) | ORAL | Status: DC | PRN
  Administered 2012-03-31 – 2012-04-07 (×9): 0.5 mg via ORAL
  Filled 2012-03-31 (×9): qty 1

## 2012-03-31 NOTE — Progress Notes (Addendum)
ANTICOAGULATION CONSULT NOTE - follow up  Pharmacy Consult for Warfarin and Heparin Indication: VTE Treatment (acute/subacute extensive DVT)  No Known Allergies  Patient Measurements: Height: 6\' 2"  (188 cm) Weight: 190 lb 7.6 oz (86.4 kg) IBW/kg (Calculated) : 82.2  Heparin Dosing Weight: 86.4kg  Vital Signs: Temp: 98.3 F (36.8 C) (07/25 0440) Temp src: Oral (07/25 0440) BP: 143/90 mmHg (07/25 0440) Pulse Rate: 78  (07/25 0440)  Labs:  Basename 03/31/12 0340 03/30/12 0330 03/29/12 1516 03/29/12 0709 03/28/12 1001  HGB 12.9* 12.5* -- -- --  HCT 38.7* 37.5* -- 36.7* --  PLT 209 140* -- 110* --  APTT -- -- -- -- 35  LABPROT 14.0 12.2 -- 12.9 --  INR 1.06 0.89 -- 0.95 --  HEPARINUNFRC 0.22* 0.34 0.43 -- --  CREATININE -- -- -- 0.54 0.53  CKTOTAL -- -- -- -- --  CKMB -- -- -- -- --  TROPONINI -- -- -- -- --    Estimated Creatinine Clearance: 134.1 ml/min (by C-G formula based on Cr of 0.54).   Medical History: Past Medical History  Diagnosis Date  . Coronary artery disease   . Degenerative joint disease of spine   . Pulmonary embolism 06/2010  . Degenerative joint disease   . 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 ETOH abuse     Medications:  Scheduled:     . docusate sodium  100 mg Oral BID  . folic acid  1 mg Oral Daily  . heparin  2,000 Units Intravenous NOW  . LORazepam  0-4 mg Intravenous Q6H   Followed by  . LORazepam  0-4 mg Intravenous Q12H  . multivitamin with minerals  1 tablet Oral Daily  . nicotine  21 mg Transdermal Daily  . pantoprazole  40 mg Oral Q1200  . thiamine  100 mg Oral Daily  . vitamin B-12  1,000 mcg Oral Daily  . warfarin  12.5 mg Oral Once  . Warfarin - Pharmacist Dosing Inpatient   Does not apply q1800   Infusions:     . sodium chloride 1,000 mL (03/30/12 1950)  . heparin 1,750 Units/hr (03/30/12 1920)   PRN: acetaminophen, acetaminophen, albuterol, diphenhydrAMINE, HYDROmorphone (DILAUDID) injection, LORazepam,  LORazepam, ondansetron (ZOFRAN) IV, ondansetron, oxyCODONE  Inpatient warfarin doses from 7/22: 10, 10, 12.5 mg  Assessment:  46 yo homeless M with hx of lupus anticoagulant disorder, EtOH abuse, and PE s/p IVC filter presented to ER 03/28/12. Pt stated he was diagnosed with a LLE DVT a few weeks ago, was started on Xarelto, but was unable to afford medication (ran out of meds > 3 weeks ago). ER doppler showed acute/subacute extensive DVT.  Patient indicates his most recent alcoholic beverage was ingested the night prior to admission. Heparin and warfarin were started for DVT treatment 03/28/12 after Lovenox 80mg  x1 in ED.  Overlap day 4 of 5 day minimum given acute DVT and non-treatment of previous DVT due to financial situation. Will also need EtOH counseling while on warfarin. Per notes, pt has agreed to inpatient rehab  Heparin level below therapeutic range on 1750 units/hour. RN reports no significant interruptions (noted IV line pulled yesterday afternoon but heparin was reconnected to alternate site shortly thereafter) or problems with line. No bleeding reported besides when IV line pulled.   INR has been slow to respond after 10mg  x 2, 12.5mg  x 1. Doses charted as given.  Thrombocytopenic at baseline in setting of slight AST elevation and EtOH use but platelets are improved and wnl  now.  Goal of Therapy:  INR 2-3 Heparin level 0.3-0.7 units/ml Monitor platelets by anticoagulation protocol: Yes   Plan:  1) Increase heparin infusion at 1950 units/hr. Recheck heparin level in 6 hours 2) Repeat Coumadin 12.5mg  today. 3) Heparin level and PT/INR daily. 6) Warfarin education to be completed prior to discharge. 7) Needs heparin / warfarin overlap for at least 5 days and until INR>=2 on two consecutive days.  Gwen Her PharmD  [phone] 03/31/2012 7:26 AM   ADDENDUM  Subsequent level is within therapeutic range on 1950 units/hour No bleeding or problems reported Continue  current rate, f/u AM heparin level  Gwen Her PharmD  [phone] 03/31/2012 2:50 PM

## 2012-03-31 NOTE — Progress Notes (Signed)
CSW met with Pt to discuss d/c planning.   Pt was able to contact [facility] for alcohol rehabilitation placement. Pt is to call tomorrow at 9 am in order to complete a telephone intact. CSW will assist as needed.   CSW contacted [facility] in [facility] and scheduled a (tentative) appt with an intake coordinator on April 05, 2012 at 8 am. Pt is concerned about transportation and being able to make that appt.   CSW will continue to search for treatment options.   CSW to follow.   Leron Croak, LCSWA [employer] [phone]

## 2012-03-31 NOTE — Progress Notes (Addendum)
[facility] PROGRESS NOTE  [name] ZOX:[id number] DOB: 1966-07-08 DOA: 03/28/2012 PCP: No primary provider on file.  Brief narrative:  46 year old male with a past medical history significant for lupus anticoagulant disorder, alcohol abuse, tobacco abuse, chronic degenerative joint disease of the spine and pulmonary embolism in the past status post IVC filter; was admitted 03/28/2012 secondary to left lower extremity swelling and pain, with associated erythema. Patient relates that he was diagnosed with DVT in left lower extremity a few weeks prior to this admission and he was placed on xarelto; patient can not pay for the medication any more. In the ED LE dopplers demonstrated acute/subacute extensive DVT. Now on heparin and coumadin, INR remains sub therapeutic.  Principal Problem:  *DVT of lower limb, acute  - dopplers positive for acute DVT in pt with lupus anticoagulant disorder  - INR still sub therapeutic - continue supportive care with analgesia as needed for pain control   Active Problems:  Alcohol abuse  - consulted on cessation of alcohol as well as tobacco  - alcohol abuse rehab on discharge  - pt was made aware if he is drinking actively he is at higher risk of fall and also would not be considered coumadin candidate  - pt verbalized understanding and wanted help with alcohol absitenence  Tobacco abuse  - consulted on cessation  - pt also made aware of higher risk of blood clots if continues to smoke  - he has verbalized understanding  - continue nicotine patch  Hyponatremia  - sodium 132 today  Lupus anticoagulant disorder  - pt was supposed to follow up with Dr. Gaylyn Rong upon discharge   Code Status: Full code  Family Communication: No family member at bedside. Patient competent, plan of care discussed in details with him.  Disposition Plan: The patient is homeless; SW assisting with safe D/C plan   Consultants:  Pharmacy for coumadin and heparin  therapy  Procedures/Studies:  Ct Angio Chest W/cm &/or Wo Cm - 03/28/2012   No evidence for pulmonary embolism. No acute chest findings.   Left lower strandy Dopplers: DVT involving the left common femoral vein, left profunda femoris vein, left femoral vein, and left popliteal vein. Superficial vein thrombosis involving the leftgreatersaphenous vein.    Manson Passey, MD  [facility] Pager [phone]  If 7PM-7AM, please contact night-coverage [email or web address] Password [email or web address] 03/31/2012, 7:18 AM   LOS: 3 days   HPI/Subjective: No acute events overnight.  Objective: Filed Vitals:   03/30/12 1305 03/30/12 1955 03/30/12 2125 03/31/12 0440  BP: 127/78  137/82 143/90  Pulse: 83 68 75 78  Temp: 98.5 F (36.9 C)  98.5 F (36.9 C) 98.3 F (36.8 C)  TempSrc: Oral  Oral Oral  Resp: 18  17 16   Height:      Weight:      SpO2: 96%  97% 97%    Intake/Output Summary (Last 24 hours) at 03/31/12 0718 Last data filed at 03/31/12 0445  Gross per 24 hour  Intake 2045.25 ml  Output   3850 ml  Net -1804.75 ml    Exam:   General:  Pt is alert, follows commands appropriately, not in acute distress  Cardiovascular: Regular rate and rhythm, S1/S2, no murmurs, no rubs, no gallops  Respiratory: Clear to auscultation bilaterally, no wheezing, no crackles, no rhonchi  Abdomen: Soft, non tender, non distended, bowel sounds present, no guarding  Extremities: trace left lower extremity edema and erythema improving, pulses DP and PT palpable bilaterally  Neuro: Grossly  nonfocal  Data Reviewed: Basic Metabolic Panel:  Lab 03/29/12 7253 03/28/12 1900 03/28/12 1001  NA 132* -- 128*  K 3.7 -- 3.9  CL 99 -- 92*  CO2 25 -- 21  GLUCOSE 84 -- 88  BUN 7 -- 10  CREATININE 0.54 -- 0.53  CALCIUM 8.2* -- 8.9  MG -- 1.9 --  PHOS -- 2.8 --   Liver Function Tests:  Lab 03/28/12 1001  AST 38*  ALT 28  ALKPHOS 88  BILITOT 0.8  PROT 7.3  ALBUMIN 3.5   CBC:  Lab 03/31/12  0340 03/30/12 0330 03/29/12 0709 03/28/12 1001  WBC 6.9 7.3 7.8 10.5  HGB 12.9* 12.5* 12.3* 13.3  HCT 38.7* 37.5* 36.7* 39.4  MCV 98.7 100.3* 99.5 99.2  PLT 209 140* 110* 91*    Studies: No results found.  Scheduled Meds:   . docusate sodium  100 mg Oral BID  . folic acid  1 mg Oral Daily  . heparin  2,000 Units Intravenous NOW  . LORazepam  0-4 mg Intravenous Q6H   Followed by  . LORazepam  0-4 mg Intravenous Q12H  . multivitamin with minerals  1 tablet Oral Daily  . nicotine  21 mg Transdermal Daily  . pantoprazole  40 mg Oral Q1200  . thiamine  100 mg Oral Daily  . vitamin B-12  1,000 mcg Oral Daily  . warfarin  12.5 mg Oral Once  . Warfarin - Pharmacist Dosing Inpatient   Does not apply q1800   Continuous Infusions:   . sodium chloride 1,000 mL (03/30/12 1950)  . heparin 1,750 Units/hr (03/30/12 1920)

## 2012-03-31 NOTE — Evaluation (Signed)
Physical Therapy Evaluation Patient Details Name: [name] MRN: [id number] DOB: [birth year] Today's Date: 03/31/2012 Time: 4098-1191 PT Time Calculation (min): 31 min  PT Assessment / Plan / Recommendation Clinical Impression  Pt. admitted w/ acute on subacute LLE DVT. Pt. is homeless. pt interested in going to  alcohol rehab.Pt will benefit from PT to improve safety with ambulation and determine if pt. will require assistive device to DC to a facility.    PT Assessment  Patient needs continued PT services    Follow Up Recommendations  No PT follow up    Barriers to Discharge Decreased caregiver support      Equipment Recommendations  Rolling walker with 5" wheels;Cane;Other (comment) (will assess which is required next visit)    Recommendations for Other Services     Frequency Min 3X/week    Precautions / Restrictions     Pertinent Vitals/Pain L groin and back of thigh/calf      Mobility  Bed Mobility Bed Mobility: Supine to Sit;Sit to Supine Supine to Sit: 7: Independent Sit to Supine: 7: Independent Transfers Transfers: Sit to Stand;Stand to Sit Sit to Stand: 4: Min guard;From bed Stand to Sit: 5: Supervision;To bed Ambulation/Gait Ambulation/Gait Assistance: 4: Min assist Ambulation Distance (Feet): 50 Feet Assistive device: Rolling walker Ambulation/Gait Assistance Details: VC to decrease weight on LLE if painful Gait Pattern: Step-to pattern;Decreased stride length;Decreased hip/knee flexion - left;Decreased dorsiflexion - left;Decreased weight shift to left Gait velocity: decreased.    Exercises     PT Diagnosis: Difficulty walking;Acute pain  PT Problem List: Decreased strength;Decreased activity tolerance;Decreased range of motion;Decreased mobility;Pain;Decreased safety awareness PT Treatment Interventions: DME instruction;Gait training;Functional mobility training;Patient/family education   PT Goals Acute Rehab PT Goals PT Goal Formulation: With  patient Time For Goal Achievement: 04/14/12 Potential to Achieve Goals: Good Pt will Ambulate: >150 feet;with least restrictive assistive device;with modified independence PT Goal: Ambulate - Progress: Goal set today  Visit Information  Last PT Received On: 03/31/12 Assistance Needed: +1    Subjective Data  Subjective: My leg is really tight  Patient Stated Goal: To go to a rehab   Prior Functioning  Home Living Lives With: Other (Comment) (homeless) Type of Home: Homeless Additional Comments: Pt is trying to get into alcohol rehab. Prior Function Level of Independence: Independent Communication Communication: No difficulties    Cognition  Overall Cognitive Status: Appears within functional limits for tasks assessed/performed Arousal/Alertness: Awake/alert Orientation Level: Appears intact for tasks assessed Behavior During Session: [facility] for tasks performed    Extremity/Trunk Assessment Right Upper Extremity Assessment RUE ROM/Strength/Tone: [facility] for tasks assessed Left Upper Extremity Assessment LUE ROM/Strength/Tone: WFL for tasks assessed Right Lower Extremity Assessment RLE ROM/Strength/Tone: [facility] for tasks assessed Left Lower Extremity Assessment LLE ROM/Strength/Tone: Deficits LLE ROM/Strength/Tone Deficits: edema limits hip and knee flexion and dorsiflexion.  Pt is able to bear weight and lift leg on/off bed w/ no assist  LLE Sensation: Deficits LLE Sensation Deficits: states toes feel tingling. Trunk Assessment Trunk Assessment: Normal   Balance    End of Session PT - End of Session Activity Tolerance: Patient limited by fatigue;Other (comment) (stated he felt a little dizzy) Patient left: in bed;with call bell/phone within reach (LLE elevated.) Nurse Communication: Mobility status  GP     Rada Hay 03/31/2012, 3:57 PM  [phone]

## 2012-04-01 ENCOUNTER — Inpatient Hospital Stay (HOSPITAL_COMMUNITY): Payer: Medicaid Other

## 2012-04-01 LAB — CBC
HCT: 37 % — ABNORMAL LOW (ref 39.0–52.0)
Hemoglobin: 12.1 g/dL — ABNORMAL LOW (ref 13.0–17.0)
MCH: 32.7 pg (ref 26.0–34.0)
MCHC: 32.7 g/dL (ref 30.0–36.0)
MCV: 100 fL (ref 78.0–100.0)
RBC: 3.7 MIL/uL — ABNORMAL LOW (ref 4.22–5.81)

## 2012-04-01 MED ORDER — HEPARIN (PORCINE) IN NACL 100-0.45 UNIT/ML-% IJ SOLN
1850.0000 [IU]/h | INTRAMUSCULAR | Status: DC
  Administered 2012-04-01 – 2012-04-06 (×9): 1850 [IU]/h via INTRAVENOUS
  Filled 2012-04-01 (×11): qty 250

## 2012-04-01 MED ORDER — WARFARIN SODIUM 7.5 MG PO TABS
15.0000 mg | ORAL_TABLET | Freq: Once | ORAL | Status: AC
  Administered 2012-04-01: 15 mg via ORAL
  Filled 2012-04-01: qty 2

## 2012-04-01 NOTE — Progress Notes (Addendum)
Patient ID: [name], male   DOB: March 22, 1966, 46 y.o.   MRN: [id number] [facility] PROGRESS NOTE  Armando Bukhari WUJ:[id number] DOB: Sep 21, 1965 DOA: 03/28/2012 PCP: No primary provider on file.  Brief narrative:  46 year old male with a past medical history significant for lupus anticoagulant disorder, alcohol abuse, tobacco abuse, chronic degenerative joint disease of the spine and pulmonary embolism in the past status post IVC filter; was admitted 03/28/2012 secondary to left lower extremity swelling and pain, with associated erythema.  LE dopplers demonstrated acute/subacute extensive DVT. Now on heparin and coumadin, INR continues to be sub therapeutic.  Principal Problem:  *DVT of lower limb, acute  - dopplers positive for acute DVT in pt with lupus anticoagulant disorder  - INR still sub therapeutic which is not entirely clear why he continues to have low INR despite being on heparin and coumadin - continue supportive care with analgesia as needed for pain control   Active Problems:  Left lower abdominal pain with questionable lipoma - will get pelvic ultrasound for evaluation Alcohol abuse  - consulted on cessation of alcohol as well as tobacco  - alcohol abuse rehab on discharge  - pt was made aware if he is drinking actively he is at higher risk of fall and also would not be considered coumadin candidate  - pt verbalized understanding and agreed help with alcohol absitenence  Tobacco abuse  - consulted on cessation  - pt also made aware of higher risk of blood clots if continues to smoke  - he has verbalized understanding  - continue nicotine patch  Hyponatremia  - sodium 132 - follow up BMP in am Lupus anticoagulant disorder  - pt was supposed to follow up with Dr. Gaylyn Rong upon discharge   Code Status: Full code  Family Communication: No family member at bedside. Patient competent, plan of care discussed in details with him.  Disposition Plan: The patient is homeless; SW  assisting with safe D/C plan   Consultants:  Pharmacy for coumadin and heparin therapy  Procedures/Studies:  Ct Angio Chest W/cm &/or Wo Cm - 03/28/2012  No evidence for pulmonary embolism. No acute chest findings.  Left lower strandy Dopplers:  DVT involving the left common femoral vein, left profunda femoris vein, left femoral vein, and left popliteal vein. Superficial vein thrombosis involving the leftgreatersaphenous vein.  Pelvic ultrasound 04/01/2012    Manson Passey, MD  [facility] Pager [phone]  If 7PM-7AM, please contact night-coverage [email or web address] Password [email or web address] 04/01/2012, 7:12 AM   LOS: 4 days   HPI/Subjective: No acute events overnight. Complains of left lower abdominal pain.   Objective: Filed Vitals:   03/31/12 1330 03/31/12 2035 03/31/12 2250 04/01/12 0445  BP: 108/69 122/69  120/75  Pulse: 75 68 80 67  Temp: 98.2 F (36.8 C) 98.1 F (36.7 C)  97.8 F (36.6 C)  TempSrc: Oral Oral  Oral  Resp: 20 16  17   Height:      Weight:      SpO2: 98% 98%  96%    Intake/Output Summary (Last 24 hours) at 04/01/12 8657 Last data filed at 04/01/12 0550  Gross per 24 hour  Intake 2822.67 ml  Output   1825 ml  Net 997.67 ml    Exam:   General:  Pt is alert, follows commands appropriately, not in acute distress  Cardiovascular: Regular rate and rhythm, S1/S2, no murmurs, no rubs, no gallops  Respiratory: Clear to auscultation bilaterally, no wheezing, no crackles, no rhonchi  Abdomen: Soft, some  tenderness appreciated in left lower quadrant, questionable lipoma, non distended, bowel sounds present, no guarding  Extremities: left lower extremity erythema and swelling although getting beter, pulses DP and PT palpable bilaterally  Neuro: Grossly nonfocal  Data Reviewed: Basic Metabolic Panel:  Lab 03/29/12 4098 03/28/12 1900 03/28/12 1001  NA 132* -- 128*  K 3.7 -- 3.9  CL 99 -- 92*  CO2 25 -- 21  GLUCOSE 84 -- 88  BUN 7 -- 10    CREATININE 0.54 -- 0.53  CALCIUM 8.2* -- 8.9  MG -- 1.9 --  PHOS -- 2.8 --   Liver Function Tests:  Lab 03/28/12 1001  AST 38*  ALT 28  ALKPHOS 88  BILITOT 0.8  PROT 7.3  ALBUMIN 3.5   CBC:  Lab 04/01/12 0345 03/31/12 0340 03/30/12 0330 03/29/12 0709 03/28/12 1001  WBC 7.5 6.9 7.3 7.8 10.5  HGB 12.1* 12.9* 12.5* 12.3* 13.3  HCT 37.0* 38.7* 37.5* 36.7* 39.4  MCV 100.0 98.7 100.3* 99.5 99.2  PLT 231 209 140* 110* 91*    Studies: No results found.  Scheduled Meds:   . docusate sodium  100 mg Oral BID  . folic acid  1 mg Oral Daily  . LORazepam  0-4 mg Intravenous Q12H  . multivitamin with minerals  1 tablet Oral Daily  . nicotine  21 mg Transdermal Daily  . pantoprazole  40 mg Oral Q1200  . thiamine  100 mg Oral Daily  . vitamin B-12  1,000 mcg Oral Daily  . warfarin  12.5 mg Oral ONCE-1800  . Warfarin - Pharmacist Dosing Inpatient   Does not apply q1800   Continuous Infusions:   . sodium chloride 1,000 mL (04/01/12 0400)  . heparin 1,950 Units/hr (03/31/12 2300)  . DISCONTD: heparin 1,950 Units/hr (03/31/12 0746)

## 2012-04-01 NOTE — Progress Notes (Signed)
UR done. 

## 2012-04-01 NOTE — Progress Notes (Signed)
CSW contacted [facility] For admission into inpatient rehabilitation at d/c.   Facility requested information for admission and that CSW contact facility at day of d/c for possible admission into the program.   CSW to follow.   Leron Croak, LCSWA [employer] [phone]

## 2012-04-01 NOTE — Progress Notes (Addendum)
ANTICOAGULATION CONSULT NOTE - follow up  Pharmacy Consult for Warfarin and Heparin Indication: VTE Treatment (acute/subacute extensive DVT)  No Known Allergies  Patient Measurements: Height: 6\' 2"  (188 cm) Weight: 190 lb 7.6 oz (86.4 kg) IBW/kg (Calculated) : 82.2  Heparin Dosing Weight: 86.4kg  Vital Signs: Temp: 97.8 F (36.6 C) (07/26 0445) Temp src: Oral (07/26 0445) BP: 120/75 mmHg (07/26 0445) Pulse Rate: 67  (07/26 0445)  Labs:  Basename 04/01/12 0345 03/31/12 1338 03/31/12 0340 03/30/12 0330  HGB 12.1* -- 12.9* --  HCT 37.0* -- 38.7* 37.5*  PLT 231 -- 209 140*  APTT -- -- -- --  LABPROT 13.9 -- 14.0 12.2  INR 1.05 -- 1.06 0.89  HEPARINUNFRC 0.71* 0.48 0.22* --  CREATININE -- -- -- --  CKTOTAL -- -- -- --  CKMB -- -- -- --  TROPONINI -- -- -- --    Estimated Creatinine Clearance: 134.1 ml/min (by C-G formula based on Cr of 0.54).   Medical History: Past Medical History  Diagnosis Date  . Coronary artery disease   . Degenerative joint disease of spine   . Pulmonary embolism 06/2010  . Degenerative joint disease   . 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 ETOH abuse     Medications:  Scheduled:     . docusate sodium  100 mg Oral BID  . folic acid  1 mg Oral Daily  . LORazepam  0-4 mg Intravenous Q12H  . multivitamin with minerals  1 tablet Oral Daily  . nicotine  21 mg Transdermal Daily  . pantoprazole  40 mg Oral Q1200  . thiamine  100 mg Oral Daily  . vitamin B-12  1,000 mcg Oral Daily  . warfarin  12.5 mg Oral ONCE-1800  . Warfarin - Pharmacist Dosing Inpatient   Does not apply q1800   Infusions:     . sodium chloride 1,000 mL (04/01/12 0400)  . heparin    . DISCONTD: heparin 1,950 Units/hr (03/31/12 0746)  . DISCONTD: heparin 1,950 Units/hr (03/31/12 2300)   PRN: acetaminophen, acetaminophen, albuterol, diphenhydrAMINE, HYDROmorphone (DILAUDID) injection, LORazepam, LORazepam, LORazepam, ondansetron (ZOFRAN) IV, ondansetron,  oxyCODONE  Inpatient warfarin doses from 7/22: 10, 10, 12.5, 12.5 mg  Assessment:  46 yo homeless M with hx of lupus anticoagulant disorder, EtOH abuse, and PE s/p IVC filter presented to ER 03/28/12. Pt stated he was diagnosed with a LLE DVT a few weeks ago, was started on Xarelto, but was unable to afford medication (ran out of meds > 3 weeks ago). ER doppler showed acute/subacute extensive DVT.  Patient indicates his most recent alcoholic beverage was ingested the night prior to admission. Heparin and warfarin were started for DVT treatment 03/28/12 after Lovenox 80mg  x1 in ED.  Today is overlap day 5 of 5 day minimum given acute DVT and non-treatment of previous DVT due to financial situation. Will also need EtOH counseling while on warfarin. Per notes, pt has agreed to inpatient rehab  Heparin level above therapeutic range on 1950 units/hour. RN reports no problems with line. No bleeding reported besides when IV line pulled 7/24.   INR has been slow to respond after 10mg  x 2, 12.5mg  x 2. Doses charted as given.  Thrombocytopenic at baseline in setting of slight AST elevation and EtOH use but platelets are improved and wnl now.  Goal of Therapy:  INR 2-3 Heparin level 0.3-0.7 units/ml Monitor platelets by anticoagulation protocol: Yes   Plan:  1) decrease heparin infusion at 1850 units/hr. Recheck heparin level  in 6 hours 2) Coumadin 15mg  today. 3) Heparin level and PT/INR daily. 6) Warfarin education to be completed prior to discharge. 7) Needs heparin / warfarin overlap for at least 5 days and until INR>=2 on two consecutive days.  Gwen Her PharmD  [phone] 04/01/2012 7:16 AM    Addendum: Completed Coumadin education. Stressed the importance of abstaining from alcohol consumption as this can increase risk of bleeding. Also stressed the importance of outpatient follow up with INR checks and MD. Pt mentioned that medicaid application had been submitted and expressed  interest in changing back to Xarelto if his application was approved and this medication was covered since Xarelto would not require f/u labwork  Gwen Her PharmD  [phone] 04/01/2012 12:30 PM

## 2012-04-01 NOTE — Progress Notes (Signed)
CSW spoke with Pt concerning d/c plan to [facility] And Pt is agreeable to plan.  CSW to follow.   Leron Croak, LCSWA [employer] [phone]

## 2012-04-02 ENCOUNTER — Inpatient Hospital Stay (HOSPITAL_COMMUNITY): Payer: Medicaid Other

## 2012-04-02 LAB — CBC
MCH: 32.4 pg (ref 26.0–34.0)
MCV: 100.5 fL — ABNORMAL HIGH (ref 78.0–100.0)
Platelets: 285 10*3/uL (ref 150–400)
RBC: 3.83 MIL/uL — ABNORMAL LOW (ref 4.22–5.81)
RDW: 14.7 % (ref 11.5–15.5)
WBC: 6.6 10*3/uL (ref 4.0–10.5)

## 2012-04-02 LAB — RAPID URINE DRUG SCREEN, HOSP PERFORMED
Barbiturates: NOT DETECTED
Cocaine: NOT DETECTED
Opiates: NOT DETECTED
Tetrahydrocannabinol: NOT DETECTED

## 2012-04-02 LAB — PROTIME-INR: Prothrombin Time: 14.7 seconds (ref 11.6–15.2)

## 2012-04-02 MED ORDER — IOHEXOL 300 MG/ML  SOLN
100.0000 mL | Freq: Once | INTRAMUSCULAR | Status: AC | PRN
  Administered 2012-04-02: 100 mL via INTRAVENOUS

## 2012-04-02 MED ORDER — WARFARIN SODIUM 10 MG PO TABS
20.0000 mg | ORAL_TABLET | Freq: Once | ORAL | Status: AC
  Administered 2012-04-02: 20 mg via ORAL
  Filled 2012-04-02: qty 2

## 2012-04-02 NOTE — Progress Notes (Signed)
Patient ID: [name], male   DOB: 07/25/1966, 46 y.o.   MRN: [id number] [facility] PROGRESS NOTE  [name] JYN:[id number] DOB: [birth year] DOA: 03/28/2012 PCP: No primary provider on file.  Brief narrative:  46 year old male with a past medical history significant for lupus anticoagulant disorder, alcohol abuse, tobacco abuse, chronic degenerative joint disease of the spine and pulmonary embolism in the past status post IVC filter; was admitted 03/28/2012 secondary to left lower extremity swelling and pain, with associated erythema. LE dopplers demonstrated acute/subacute extensive DVT. Now on heparin and coumadin, INR continues to be sub therapeutic.   Principal Problem:  *DVT of lower limb, acute in pt with lupus anticoagulant disorder  - dopplers positive for acute DVT  - INR remains below therapeutic level, unclear why is it difficult to anticoagulate considering we have patient on heparin and coumadin - would suggest having higher doses of coumadin as at this time he is not fully anticoagulated - follow up UDS and alcohol level  Active Problems:  Left lower abdominal pain with questionable lipoma  - pelvic ultrasound done 03/31/2012 with findings indicated below - due to an ongoing left abdominal lower quadrant tenderness will obtain CT abd/pelvis Alcohol abuse  - patient was counseled on cessation of alcohol as well as tobacco  - alcohol abuse rehab on discharge  - pt was made aware if he is drinking actively he is at higher risk of fall and also would not be considered coumadin candidate  - pt verbalized understanding and agreed help with alcohol absitenence  Tobacco abuse  - cessation on tobacco abuse provided - pt also made aware of higher risk of blood clots if continues to smoke  - continue nicotine patch  Hyponatremia  - sodium stable  - follow up BMP in am  Lupus anticoagulant disorder  - pt  to follow up with Dr. Gaylyn Rong upon discharge   Code Status: Full code    Family Communication: No family member at bedside. Patient competent, plan of care discussed in details with him.  Disposition Plan: The patient is homeless; SW assisting with safe D/C plan   Consultants:   Pharmacy for coumadin and heparin therapy Procedures  none  Manson Passey, MD  [facility] Pager [phone]  If 7PM-7AM, please contact night-coverage [email or web address] Password [email or web address] 04/02/2012, 11:39 AM   LOS: 5 days   HPI/Subjective: No acute events overnight but patient continues to experience left lower abdomen discomfort.  Objective: Filed Vitals:   04/01/12 0445 04/01/12 1509 04/01/12 2245 04/02/12 0558  BP: 120/75 124/70 122/68 119/78  Pulse: 67 62 61 64  Temp: 97.8 F (36.6 C) 97.8 F (36.6 C) 98.1 F (36.7 C) 98 F (36.7 C)  TempSrc: Oral Oral Oral Oral  Resp: 17 16 18 16   Height:      Weight:      SpO2: 96% 97% 99% 99%    Intake/Output Summary (Last 24 hours) at 04/02/12 1139 Last data filed at 04/02/12 0800  Gross per 24 hour  Intake 1370.17 ml  Output   3375 ml  Net -2004.83 ml    Exam:   General:  Pt is alert, follows commands appropriately, not in acute distress  Cardiovascular: Regular rate and rhythm, S1/S2, no murmurs, no rubs, no gallops  Respiratory: Clear to auscultation bilaterally, no wheezing, no crackles, no rhonchi  Abdomen: Soft, tender in left lower quadrant, non distended, bowel sounds present, no guarding  Extremities: left lower extremity erythema and  Swelling; pulses palpable bilaterally  Neuro: Grossly nonfocal  Data Reviewed: Basic Metabolic Panel:  Lab 03/29/12 1610 03/28/12 1900 03/28/12 1001  NA 132* -- 128*  K 3.7 -- 3.9  CL 99 -- 92*  CO2 25 -- 21  GLUCOSE 84 -- 88  BUN 7 -- 10  CREATININE 0.54 -- 0.53  CALCIUM 8.2* -- 8.9  MG -- 1.9 --  PHOS -- 2.8 --   Liver Function Tests:  Lab 03/28/12 1001  AST 38*  ALT 28  ALKPHOS 88  BILITOT 0.8  PROT 7.3  ALBUMIN 3.5   CBC:  Lab  04/02/12 0415 04/01/12 0345 03/31/12 0340 03/30/12 0330 03/29/12 0709 03/28/12 1001  WBC 6.6 7.5 6.9 7.3 7.8 --  NEUTROABS -- -- -- -- -- 8.4*  HGB 12.4* 12.1* 12.9* 12.5* 12.3* --  HCT 38.5* 37.0* 38.7* 37.5* 36.7* --  MCV 100.5* 100.0 98.7 100.3* 99.5 --  PLT 285 231 209 140* 110* --    Studies: US Pelvis Limited 04/01/2012  *  IMPRESSION:  Noncompressible, tubular structure containing internal homogeneous low level echoes in the anatomic position of the great saphenous vein.  Given the clinical findings of a palpable cord and overlying skin erythema, these findings are most consistent with superficial thrombophlebitis.  However, evaluation is limited as this is not a vascular ultrasound.  Consider further evaluation with dedicated left lower extremity Doppler ultrasound.  Original Report Authenticated By: Vilma Prader    Scheduled Meds:   . docusate sodium  100 mg Oral BID  . folic acid  1 mg Oral Daily  . LORazepam  0-4 mg Intravenous Q12H  . multivitamin with minerals  1 tablet Oral Daily  . nicotine  21 mg Transdermal Daily  . pantoprazole  40 mg Oral Q1200  . thiamine  100 mg Oral Daily  . vitamin B-12  1,000 mcg Oral Daily  . warfarin  15 mg Oral ONCE-1800  . warfarin  20 mg Oral ONCE-1800  . Warfarin - Pharmacist Dosing Inpatient   Does not apply q1800   Continuous Infusions:   . sodium chloride 1,000 mL (04/01/12 0400)  . heparin 1,850 Units/hr (04/02/12 0046)

## 2012-04-02 NOTE — Progress Notes (Signed)
ANTICOAGULATION CONSULT NOTE - Follow up  Pharmacy Consult for Warfarin + Heparin Indication: VTE Treatment (acute/subacute extensive DVT)  No Known Allergies  Patient Measurements: Height: 6\' 2"  (188 cm) Weight: 190 lb 7.6 oz (86.4 kg) IBW/kg (Calculated) : 82.2  Heparin Dosing Weight: 86.4kg  Vital Signs: Temp: 98 F (36.7 C) (07/27 0558) Temp src: Oral (07/27 0558) BP: 119/78 mmHg (07/27 0558) Pulse Rate: 64  (07/27 0558)  Labs:  Basename 04/02/12 0415 04/01/12 1344 04/01/12 0345 03/31/12 0340  HGB 12.4* -- 12.1* --  HCT 38.5* -- 37.0* 38.7*  PLT 285 -- 231 209  APTT -- -- -- --  LABPROT 14.7 -- 13.9 14.0  INR 1.13 -- 1.05 1.06  HEPARINUNFRC 0.48 0.52 0.71* --  CREATININE -- -- -- --  CKTOTAL -- -- -- --  CKMB -- -- -- --  TROPONINI -- -- -- --   Estimated Creatinine Clearance: 134.1 ml/min (by C-G formula based on Cr of 0.54).  Inpatient warfarin doses from 7/22: 10, 10, 12.5, 12.5, 15 mg  Assessment:  46 yo homeless M with hx of lupus anticoagulant disorder, EtOH abuse, and PE s/p IVC filter presented to ER 03/28/12. Pt stated he was diagnosed with a LLE DVT a few weeks ago, was started on Xarelto, but was unable to afford medication (ran out of meds > 3 weeks ago). ER doppler showed acute/subacute extensive DVT.  Heparin and warfarin were started for DVT treatment 03/28/12 after Lovenox 80mg  x1 in ED. Coumadin score = 8.  Today is overlap day 6 of 5 day minimum given acute DVT and non-treatment of previous DVT due to financial situation.  Heparin level therapeutic x 2 on Heparin 1850 units/hr. No bleeding reported besides when IV line pulled 7/24.   INR has been slow to respond after 5 days of Coumadin (see dosages above).  Doses all charted as given.  Thrombocytopenic at baseline in setting of slight AST elevation and EtOH use but platelets are improved and wnl now.  H/H stable.   Coumadin education completed 7/26.  Pt mentioned that medicaid application had been  submitted and expressed interest in changing back to Xarelto if his application was approved and this medication was covered since Xarelto would not require f/u labwork  Goal of Therapy:  INR 2-3 Heparin level 0.3-0.7 units/ml Monitor platelets by anticoagulation protocol: Yes   Plan:  1) Continue Heparin infusion at 1850 units/hr. 2) Coumadin 20 mg x 1 tonight. 3) Heparin level and PT/INR daily. 4) Needs heparin / warfarin overlap for at least 5 days and until INR>=2 on two consecutive days.

## 2012-04-03 LAB — CBC
MCV: 100.5 fL — ABNORMAL HIGH (ref 78.0–100.0)
MCV: 100 fL (ref 78.0–100.0)
Platelets: 320 10*3/uL (ref 150–400)
Platelets: 328 10*3/uL (ref 150–400)
RBC: 3.78 MIL/uL — ABNORMAL LOW (ref 4.22–5.81)
RBC: 3.7 MIL/uL — ABNORMAL LOW (ref 4.22–5.81)
RDW: 14.8 % (ref 11.5–15.5)
RDW: 14.6 % (ref 11.5–15.5)
WBC: 6.5 10*3/uL (ref 4.0–10.5)
WBC: 6.5 10*3/uL (ref 4.0–10.5)

## 2012-04-03 LAB — BASIC METABOLIC PANEL
CO2: 25 mEq/L (ref 19–32)
Chloride: 102 mEq/L (ref 96–112)
Creatinine, Ser: 0.74 mg/dL (ref 0.50–1.35)
GFR calc Af Amer: >90 mL/min (ref >90)
Potassium: 3.7 mEq/L (ref 3.5–5.1)
Sodium: 137 mEq/L (ref 135–145)

## 2012-04-03 LAB — HEPARIN LEVEL (UNFRACTIONATED): Heparin Unfractionated: 0.32 IU/mL (ref 0.30–0.70)

## 2012-04-03 LAB — PROTIME-INR
INR: 1.24 (ref 0.00–1.49)
Prothrombin Time: 15.9 seconds — ABNORMAL HIGH (ref 11.6–15.2)

## 2012-04-03 MED ORDER — WARFARIN SODIUM 5 MG PO TABS
25.0000 mg | ORAL_TABLET | Freq: Once | ORAL | Status: AC
  Administered 2012-04-03: 25 mg via ORAL
  Filled 2012-04-03: qty 1

## 2012-04-03 NOTE — Progress Notes (Signed)
ANTICOAGULATION CONSULT NOTE - Follow up  Pharmacy Consult for Warfarin + Heparin Indication: VTE Treatment (acute/subacute extensive DVT)  No Known Allergies  Patient Measurements: Height: 6\' 2"  (188 cm) Weight: 190 lb 7.6 oz (86.4 kg) IBW/kg (Calculated) : 82.2  Heparin Dosing Weight: 86.4kg  Vital Signs: Temp: 97.6 F (36.4 C) (07/28 0409) Temp src: Oral (07/28 0409) BP: 112/66 mmHg (07/28 0409) Pulse Rate: 64  (07/28 0409)  Labs:  Basename 04/03/12 0404 04/02/12 0415 04/01/12 1344 04/01/12 0345  HGB 12.2* 12.4* -- --  HCT 38.0* 38.5* -- 37.0*  PLT 320 285 -- 231  APTT -- -- -- --  LABPROT 15.9* 14.7 -- 13.9  INR 1.24 1.13 -- 1.05  HEPARINUNFRC 0.32 0.48 0.52 --  CREATININE -- -- -- --  CKTOTAL -- -- -- --  CKMB -- -- -- --  TROPONINI -- -- -- --   Estimated Creatinine Clearance: 134.1 ml/min (by C-G formula based on Cr of 0.54).  Inpatient warfarin doses from 7/22: 10, 10, 12.5, 12.5, 15, 20 mg  Assessment:  46 yo homeless M with hx of lupus anticoagulant disorder, EtOH abuse, and PE s/p IVC filter presented to ER 03/28/12. Pt stated he was diagnosed with a LLE DVT a few weeks ago, was started on Xarelto, but was unable to afford medication (ran out of meds > 3 weeks ago). ER doppler showed acute/subacute extensive DVT.  Heparin and warfarin were started for DVT treatment 03/28/12 after Lovenox 80mg  x1 in ED. Coumadin score = 8.  Thrombocytopenic at baseline in setting of slight AST elevation and EtOH use but platelets now improved to wnl. H/H stable.   Coumadin education completed 7/26.  Pt mentioned that medicaid application had been submitted and expressed interest in changing back to Xarelto if his application was approved and this medication was covered since Xarelto would not require f/u labwork  Today is overlap day 7 of 5 day minimum. Heparin level therapeutic on Heparin 1850 units/hr. No bleeding reported.  INR has been slow to respond after 6 days of high  dose Coumadin (see dosages above).  Doses all charted as given.  MD requesting higher doses of Coumadin. RN asked to assist with monitoring if actual Coumadin doses ingested.   Goal of Therapy:  INR 2-3 Heparin level 0.3-0.7 units/ml Monitor platelets by anticoagulation protocol: Yes   Plan:  1) Continue Heparin infusion at 1850 units/hr. 2) Coumadin 25 mg x 1 tonight. 3) Heparin level and PT/INR daily. 4) Needs heparin / warfarin overlap for at least 5 days and until INR>=2 on two consecutive days.  Geoffry Paradise, PharmD, BCPS Pager: [phone] 6:35 AM

## 2012-04-03 NOTE — Progress Notes (Signed)
[facility] PROGRESS NOTE  [name] WUJ:[id number] DOB: Dec 25, 1965 DOA: 03/28/2012 PCP: No primary provider on file.  Brief narrative:  46 year old male with a past medical history significant for lupus anticoagulant disorder, alcohol abuse, tobacco abuse, chronic degenerative joint disease of the spine and pulmonary embolism in the past status post IVC filter; was admitted 03/28/2012 secondary to left lower extremity swelling and pain, with associated erythema. LE dopplers demonstrated acute/subacute extensive DVT. On heparin and coumadin, INR continues to be sub therapeutic.   Assessment and Plan:  Principal Problem:  *DVT of lower limb, acute in pt with lupus anticoagulant disorder  - dopplers positive for acute DVT  - INR remains below therapeutic level but slightly up from yesterday; it may be that patient requires really high doses of coumadin as he is not therapeutic even while on heparin - follow up UDS and alcohol level 04/02/2012 is all WNL  Active Problems:  Left lower abdominal pain with questionable lipoma  - pelvic ultrasound done 03/31/2012 with findings indicated below  - due to an ongoing left abdominal lower quadrant tenderness CT abd/pelvis done which shows that the discomfort around LLQ area may be due to ongoing exremity swelling and DVT  Alcohol abuse  - patient was counseled on cessation of alcohol - alcohol abuse rehab on discharge  - pt was made aware if he is drinking actively he is at higher risk of fall and also would not be considered coumadin candidate  - pt verbalized understanding and agreed help with alcohol absitenence   Tobacco abuse  - smoking cessation counseling provided  - pt also made aware of higher risk of blood clots if continues to smoke  - continue nicotine patch   Hyponatremia  - sodium stable  - follow up BMP in am   Lupus anticoagulant disorder  - pt will follow up with Dr. Gaylyn Rong upon discharge   Code Status: Full code  Family  Communication: No family member at bedside. Patient competent, plan of care discussed in details with him.  Disposition Plan: The patient is homeless; SW assisting with safe D/C plan   Consultants:  Pharmacy for coumadin and heparin therapy Procedures  none  Manson Passey, MD  [facility] Pager [phone]  If 7PM-7AM, please contact night-coverage [email or web address] Password [email or web address] 04/03/2012, 9:30 AM   LOS: 6 days   HPI/Subjective: No significant events overnight.  Objective: Filed Vitals:   04/02/12 0558 04/02/12 1332 04/02/12 2011 04/03/12 0409  BP: 119/78 127/74 108/66 112/66  Pulse: 64 65 64 64  Temp: 98 F (36.7 C) 98.8 F (37.1 C) 98.1 F (36.7 C) 97.6 F (36.4 C)  TempSrc: Oral Oral Oral Oral  Resp: 16 16 16 15   Height:      Weight:      SpO2: 99% 97% 98% 99%    Intake/Output Summary (Last 24 hours) at 04/03/12 0930 Last data filed at 04/03/12 0410  Gross per 24 hour  Intake   1400 ml  Output   2650 ml  Net  -1250 ml    Exam:   General:  Pt is alert, follows commands appropriately, not in acute distress  Cardiovascular: Regular rate and rhythm, S1/S2, no murmurs, no rubs, no gallops  Respiratory: Clear to auscultation bilaterally, no wheezing, no crackles, no rhonchi  Abdomen: some tenderness appreciated over left lower abdominal quadrant, bowel sounds present, no guarding  Extremities: left lower extremity erythema and swelling slightly better than yesterday  Neuro: Grossly nonfocal  Data Reviewed: Basic Metabolic  Panel:  Lab 03/29/12 0709 03/28/12 1900 03/28/12 1001  NA 132* -- 128*  K 3.7 -- 3.9  CL 99 -- 92*  CO2 25 -- 21  GLUCOSE 84 -- 88  BUN 7 -- 10  CREATININE 0.54 -- 0.53  CALCIUM 8.2* -- 8.9  MG -- 1.9 --  PHOS -- 2.8 --   Liver Function Tests:  Lab 03/28/12 1001  AST 38*  ALT 28  ALKPHOS 88  BILITOT 0.8  PROT 7.3  ALBUMIN 3.5   CBC:  Lab 04/03/12 0404 04/02/12 0415 04/01/12 0345 03/31/12 0340 03/30/12  0330 03/28/12 1001  WBC 6.5 6.6 7.5 6.9 7.3 --  NEUTROABS -- -- -- -- -- 8.4*  HGB 12.2* 12.4* 12.1* 12.9* 12.5* --  HCT 38.0* 38.5* 37.0* 38.7* 37.5* --  MCV 100.5* 100.5* 100.0 98.7 100.3* --  PLT 320 285 231 209 140* --    Studies: US Pelvis Limited 04/01/2012  *  IMPRESSION:  Noncompressible, tubular structure containing internal homogeneous low level echoes in the anatomic position of the great saphenous vein.  Given the clinical findings of a palpable cord and overlying skin erythema, these findings are most consistent with superficial thrombophlebitis.  However, evaluation is limited as this is not a vascular ultrasound.  Consider further evaluation with dedicated left lower extremity Doppler ultrasound.    Ct Abdomen Pelvis W Contrast 04/02/2012  *  IMPRESSION: No evidence of acute abnormality.  Continued inflammation/swelling of the upper left leg/lower left pelvis.   Scheduled Meds:   . docusate sodium  100 mg Oral BID  . folic acid  1 mg Oral Daily  . multivitamin   1 tablet Oral Daily  . nicotine  21 mg Transdermal Daily  . pantoprazole  40 mg Oral Q1200  . thiamine  100 mg Oral Daily  . vitamin B-12  1,000 mcg Oral Daily  . warfarin  20 mg Oral ONCE-1800   Continuous Infusions:   . sodium chloride 1,000 mL (04/01/12 0400)  . heparin 1,850 Units/hr (04/03/12 0636)

## 2012-04-04 DIAGNOSIS — M1990 Unspecified osteoarthritis, unspecified site: Secondary | ICD-10-CM

## 2012-04-04 LAB — CBC
HCT: 38.3 % — ABNORMAL LOW (ref 39.0–52.0)
Hemoglobin: 12.5 g/dL — ABNORMAL LOW (ref 13.0–17.0)
MCHC: 32.6 g/dL (ref 30.0–36.0)
RBC: 3.86 MIL/uL — ABNORMAL LOW (ref 4.22–5.81)
WBC: 6.9 10*3/uL (ref 4.0–10.5)

## 2012-04-04 LAB — HEPARIN LEVEL (UNFRACTIONATED): Heparin Unfractionated: 0.32 IU/mL (ref 0.30–0.70)

## 2012-04-04 LAB — PROTIME-INR
INR: 1.42 (ref 0.00–1.49)
Prothrombin Time: 17.6 seconds — ABNORMAL HIGH (ref 11.6–15.2)

## 2012-04-04 MED ORDER — WARFARIN SODIUM 5 MG PO TABS
25.0000 mg | ORAL_TABLET | Freq: Once | ORAL | Status: AC
  Administered 2012-04-04: 25 mg via ORAL
  Filled 2012-04-04: qty 1

## 2012-04-04 NOTE — Progress Notes (Signed)
ANTICOAGULATION CONSULT NOTE - Follow up  Pharmacy Consult for Warfarin + Heparin Indication: VTE Treatment (acute/subacute extensive DVT)  No Known Allergies  Patient Measurements: Height: 6\' 2"  (188 cm) Weight: 190 lb 7.6 oz (86.4 kg) IBW/kg (Calculated) : 82.2  Heparin Dosing Weight: 86.4kg  Vital Signs: Temp: 98.1 F (36.7 C) (07/29 0519) Temp src: Oral (07/29 0519) BP: 127/75 mmHg (07/29 0519) Pulse Rate: 62  (07/29 0519)  Labs:  Basename 04/04/12 0344 04/03/12 1014 04/03/12 0404 04/02/12 0415  HGB 12.5* 12.0* -- --  HCT 38.3* 37.0* 38.0* --  PLT 374 328 320 --  APTT -- -- -- --  LABPROT 17.6* -- 15.9* 14.7  INR 1.42 -- 1.24 1.13  HEPARINUNFRC 0.32 -- 0.32 0.48  CREATININE -- 0.74 -- --  CKTOTAL -- -- -- --  CKMB -- -- -- --  TROPONINI -- -- -- --   Estimated Creatinine Clearance: 134.1 ml/min (by C-G formula based on Cr of 0.74).  Inpatient warfarin doses from 7/22: 10, 10, 12.5, 12.5, 15, 20, 25mg   Assessment:  46 yo homeless M with hx of lupus anticoagulant disorder, EtOH abuse, and PE s/p IVC filter presented to ER 03/28/12. Pt stated he was diagnosed with a LLE DVT a few weeks ago, was started on Xarelto, but was unable to afford medication (ran out of meds > 3 weeks ago). ER doppler showed acute/subacute extensive DVT.  Heparin and warfarin were started for DVT treatment 03/28/12 after Lovenox 80mg  x1 in ED. Coumadin score = 8.  Thrombocytopenic at baseline in setting of slight AST elevation and EtOH use but platelets now improved to wnl. H/H stable.  Coumadin education completed 7/26.  Pt mentioned that medicaid application had been submitted and expressed interest in changing back to Xarelto if his application was approved and this medication was covered since Xarelto would not require f/u labwork  Today is overlap day 8. Heparin level therapeutic on Heparin 1850 units/hr. No bleeding reported.  INR is slowly climbing after 7 days of high dose Coumadin (see  dosages above).  Doses all charted as given. RN verified that dose yesterday was ingested  Goal of Therapy:  INR 2-3 Heparin level 0.3-0.7 units/ml Monitor platelets by anticoagulation protocol: Yes   Plan:  1) Continue Heparin infusion at 1850 units/hr. 2) Coumadin 25 mg x 1 tonight. 3) Heparin level and PT/INR daily. 4) Needs heparin / warfarin overlap until INR>=2 on two consecutive days.  Gwen Her PharmD  [phone] 04/04/2012 9:07 AM

## 2012-04-04 NOTE — Progress Notes (Signed)
Physical Therapy Treatment Patient Details Name: [name] MRN: [id number] DOB: 1966-01-10 Today's Date: 04/04/2012 Time: 4132-4401 PT Time Calculation (min): 28 min  PT Assessment / Plan / Recommendation Comments on Treatment Session  Pt will need a cane for safety. Needs more practice w/ SPC for balance. Pt tolerated farther distance. Per chart, pt may DC to a facility. ,pt will need to be independent .    Follow Up Recommendations  No PT follow up    Barriers to Discharge        Equipment Recommendations  Cane    Recommendations for Other Services    Frequency Min 3X/week   Plan Discharge plan remains appropriate;Frequency remains appropriate    Precautions / Restrictions Precautions Precautions: Fall   Pertinent Vitals/Pain 4/10 LLE  Premedicated.    Mobility  Bed Mobility Supine to Sit: 7: Independent Transfers Transfers: Sit to Stand;Stand to Sit Sit to Stand: 6: Modified independent (Device/Increase time);From bed;From chair/3-in-1;With upper extremity assist Stand to Sit: 7: Independent;With upper extremity assist;To chair/3-in-1 Ambulation/Gait Ambulation/Gait Assistance: 4: Min assist Ambulation Distance (Feet): 300 Feet Ambulation/Gait Assistance Details: Pt used SPC for first time. Gait is slower than w/ RW. At times he hesitates and almost stops. pt used IV pole about 50% of distance.. Lists to R attimes. mi9nguard to min steady asssit Gait Pattern: Step-through pattern;Decreased step length - right;Decreased stance time - left Gait velocity: decreased    Exercises     PT Diagnosis:    PT Problem List:   PT Treatment Interventions:     PT Goals Acute Rehab PT Goals Pt will Ambulate: >150 feet;with modified independence;with least restrictive assistive device PT Goal: Ambulate - Progress: Progressing toward goal  Visit Information  Last PT Received On: 04/04/12 Assistance Needed: +1    Subjective Data  Subjective: Thank you for walking me. I went  farther.   Cognition  Overall Cognitive Status: Appears within functional limits for tasks assessed/performed Arousal/Alertness: Awake/alert Orientation Level: Appears intact for tasks assessed Behavior During Session: [facility] for tasks performed Cognition - Other Comments: was disoriented to [place] vs. [place].    Balance     End of Session PT - End of Session Activity Tolerance: Patient tolerated treatment well Patient left: in chair;with call bell/phone within reach   GP     Rada Hay 04/04/2012, 9:56 AM [phone]

## 2012-04-04 NOTE — Progress Notes (Signed)
Patient ID: [name], male   DOB: 1966/05/23, 46 y.o.   MRN: [id number] [facility] PROGRESS NOTE  Ashwin Tibbs [id number] DOB: July 23, 1966 DOA: 03/28/2012 PCP: No primary provider on file.  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 LE dopplers demonstrated acute/subacute extensive DVT. Continues to be on heparin and coumadin.  Assessment and Plan:   Principal Problem:  *DVT of lower limb, acute in pt with lupus anticoagulant disorder  - dopplers positive for acute DVT  - INR slowly trending upwards, today 1.4; perhaps he just needs higher coumadin dose for full anticoagulation - coumadin per pharmacy  Active Problems:  Left lower abdominal pain with questionable lipoma  - pelvic ultrasound and CT abd/pelvis done 03/31/2012 with findings indicated below - feels better today  Alcohol abuse  - patient was counseled on alcohol cessation - alcohol abuse rehab on discharge  - pt was made aware if he is drinking actively he is at higher risk of fall and also would not be considered coumadin candidate   Tobacco abuse  - provided smoking cessation counseling  - pt also made aware of higher risk of blood clots if continues to smoke  - will continue nicotine patch   Hyponatremia  - resolved  Lupus anticoagulant disorder  - pt will follow up with Dr. Gaylyn Rong upon discharge   Code Status: Full code  Family Communication: No family member at bedside  Disposition Plan: The patient is homeless; SW assisting with safe D/C plan   Consultants:  Pharmacy for coumadin and heparin therapy Procedures  none  Manson Passey, MD  [facility] Pager [phone]  If 7PM-7AM, please contact  night-coverage [email or web address] Password [email or web address] 04/04/2012, 1:01 PM   LOS: 7 days   HPI/Subjective: /no acute events overnight,  Objective: Filed Vitals:   04/03/12 0409 04/03/12 1432 04/03/12 2016 04/04/12 0519  BP: 112/66 118/68 118/67 127/75  Pulse: 64 67 64 62  Temp: 97.6 F (36.4 C) 98.2 F (36.8 C) 97.9 F (36.6 C) 98.1 F (36.7 C)  TempSrc: Oral Oral Oral Oral  Resp: 15 16 16 18   Height:      Weight:      SpO2: 99% 100% 99% 97%    Intake/Output Summary (Last 24 hours) at 04/04/12 1301 Last data filed at 04/04/12 0700  Gross per 24 hour  Intake 2514.5 ml  Output   3650 ml  Net -1135.5 ml    Exam:   General:  Pt is alert, follows commands appropriately, not in acute distress  Cardiovascular: Regular rate and rhythm, S1/S2, no murmurs, no rubs, no gallops  Respiratory: Clear to auscultation bilaterally, no wheezing, no crackles, no rhonchi  Abdomen: Soft, non tender, non distended, bowel sounds present, no guarding  Extremities: LE left erythema and swelling slowly improving  Neuro: Grossly nonfocal  Data Reviewed: Basic Metabolic Panel:  Lab 04/03/12 8657 03/29/12 0709 03/28/12 1900  NA 137 132* --  K 3.7 3.7 --  CL 102 99 --  CO2 25 25 --  GLUCOSE 103* 84 --  BUN 9 7 --  CREATININE 0.74 0.54 --  CALCIUM 8.7 8.2* --  MG -- -- 1.9  PHOS -- -- 2.8   Liver Function Tests: No results found for this basename: AST:5,ALT:5,ALKPHOS:5,BILITOT:5,PROT:5,ALBUMIN:5 in the last 168  hours No results found for this basename: LIPASE:5,AMYLASE:5 in the last 168 hours No results found for this basename: AMMONIA:5 in the last 168 hours CBC:  Lab 04/04/12 0344 04/03/12 1014 04/03/12 0404 04/02/12 0415 04/01/12 0345  WBC 6.9 6.5 6.5 6.6 7.5  NEUTROABS -- -- -- -- --  HGB 12.5* 12.0* 12.2* 12.4* 12.1*  HCT 38.3* 37.0* 38.0* 38.5* 37.0*  MCV 99.2 100.0 100.5* 100.5* 100.0  PLT 374 328 320 285 231   Cardiac Enzymes: No results found for this basename:  CKTOTAL:5,CKMB:5,CKMBINDEX:5,TROPONINI:5 in the last 168 hours BNP: No components found with this basename: POCBNP:5 CBG: No results found for this basename: GLUCAP:5 in the last 168 hours  No results found for this or any previous visit (from the past 240 hour(s)).   Studies: No results found.  Scheduled Meds:   . docusate sodium  100 mg Oral BID  . folic acid  1 mg Oral Daily  . multivitamin with minerals  1 tablet Oral Daily  . nicotine  21 mg Transdermal Daily  . pantoprazole  40 mg Oral Q1200  . thiamine  100 mg Oral Daily  . vitamin B-12  1,000 mcg Oral Daily  . warfarin  25 mg Oral ONCE-1800  . warfarin  25 mg Oral ONCE-1800  . Warfarin - Pharmacist Dosing Inpatient   Does not apply q1800   Continuous Infusions:   . sodium chloride 50 mL/hr at 04/03/12 1530  . heparin 1,850 Units/hr (04/04/12 0851)

## 2012-04-04 NOTE — Progress Notes (Signed)
Initial contact with pt.  Saw on rounds d/t impending discharge to [facility] and history of non-compliance.    Provided brief support to pt around upcoming discharge plans.  Pt reports that he must cease drinking d/t medication.  Related prior discharge where a friend came to pick him up from the assisted living facility and they spent a week away from facility drinking.  Upon return to facility, pt reports he had been discharged w/o medication - leading to his re-hospitalization.     In conversation about rehab and process of recovery, pt described previous experience with rehab several years ago and his dislike of rehab process.  Pt feels as though rehab process is  "a bunch of psychological games" - wherein the rehab facility made him think "if I took another drink I would die, or something."    Chaplain engaged pt and provided brief support around strategies and coping skills he might find helpful for his rehab process.  Pt was uncertain and spoke with chaplain about taking rehab process one step at a time.        Belva Crome  MDiv, Chaplain    04/04/12 1400  Clinical Encounter Type  Visited With Patient  Visit Type Initial;Psychological support;Social support;Spiritual support  Referral From Other (Comment) (saw on rounds)  Consult/Referral To [address]

## 2012-04-05 LAB — PROTIME-INR: INR: 1.66 — ABNORMAL HIGH (ref 0.00–1.49)

## 2012-04-05 LAB — CBC
HCT: 38.3 % — ABNORMAL LOW (ref 39.0–52.0)
MCHC: 32.9 g/dL (ref 30.0–36.0)
Platelets: 376 10*3/uL (ref 150–400)
RDW: 14.7 % (ref 11.5–15.5)
WBC: 6.5 10*3/uL (ref 4.0–10.5)

## 2012-04-05 LAB — HEPARIN LEVEL (UNFRACTIONATED): Heparin Unfractionated: 0.51 IU/mL (ref 0.30–0.70)

## 2012-04-05 MED ORDER — WARFARIN SODIUM 5 MG PO TABS
25.0000 mg | ORAL_TABLET | Freq: Once | ORAL | Status: AC
  Administered 2012-04-05: 25 mg via ORAL
  Filled 2012-04-05: qty 1

## 2012-04-05 NOTE — Progress Notes (Signed)
[facility] PROGRESS NOTE  Burleigh Brockmann [id number] DOB: [birth year] DOA: 03/28/2012 PCP: No primary provider on file.  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 LE dopplers demonstrated acute/subacute extensive DVT. Continues to be on heparin and coumadin and INR trending upwards, today 1.66. I was told by case management that the patient may qualify for xarelto but this has not been confirmed. I would keep the patient on coumadin as it seems that he is responding slowly to it as he requires very high dose of coumadin. Due to questionable compliance on patient's part due limited financial reserves I think it would be reasonable to wait for therapeutic INR rather than face the issue of having to afford xarelto in future (specially after alcohol rehab is completed).  Assessment and Plan:   Principal Problem:  *DVT of lower limb, acute in pt with lupus anticoagulant disorder   dopplers positive for acute DVT; patient requires high dose of coumadin, 25 mg  INR slowly trending upwards, today 1.66 (yesterday 1.4)  coumadin per pharmacy   Active Problems:  Left lower abdominal pain with questionable lipoma   pelvic ultrasound and CT abd/pelvis done 03/31/2012 with findings which suggest that this may be an extension of patient's DVT  patient feels better today Alcohol abuse   patient was counseled on alcohol cessation   alcohol abuse rehab on discharge   pt was made aware if he is drinking actively he is at higher risk of fall and also would not be considered coumadin candidate  Tobacco abuse   provided smoking cessation counseling   pt also made aware of higher risk of blood clots  if continues to smoke   will continue nicotine patch  Hyponatremia   resolved  Lupus anticoagulant disorder   pt will follow up with Dr. Gaylyn Rong upon discharge   Code Status: Full code  Family Communication: No family member at bedside  Disposition Plan: D/C to [facility]- alcohol rehab once bed available  Consultants:  Pharmacy for coumadin and heparin therapy Procedures  none  Manson Passey, MD  [facility] Pager [phone]  If 7PM-7AM, please contact night-coverage [email or web address] Password [email or web address] 04/05/2012, 2:05 PM   LOS: 8 days   HPI/Subjective: No acute events overnight.  Objective: Filed Vitals:   04/04/12 1406 04/04/12 2204 04/05/12 0420 04/05/12 0700  BP: 102/67 142/81 121/70   Pulse: 66 51 48 60  Temp: 98.3 F (36.8 C) 98.9 F (37.2 C) 98.8 F (37.1 C)   TempSrc: Oral Oral Oral   Resp: 18 16 16    Height:      Weight:      SpO2: 98% 100% 99%     Intake/Output Summary (Last 24 hours) at 04/05/12 1405 Last data filed at 04/05/12 0700  Gross per 24 hour  Intake   4698 ml  Output      0 ml  Net   4698 ml    Exam:   General:  Pt is alert, follows commands appropriately, not in acute distress  Cardiovascular: Regular rate and rhythm, S1/S2, no murmurs, no rubs, no gallops  Respiratory: Clear to auscultation bilaterally, no wheezing, no crackles, no rhonchi  Abdomen: Soft, non tender, non distended, bowel sounds present, no guarding  Extremities: left lower extremity edema and  erythema but improving, pulses DP and PT palpable bilaterally  Neuro: Grossly nonfocal  Data Reviewed: Basic Metabolic Panel:  Lab 04/03/12 1610  NA 137  K 3.7  CL 102  CO2 25  GLUCOSE 103*  BUN 9  CREATININE 0.74  CALCIUM 8.7  MG --  PHOS --   CBC:  Lab 04/05/12 0515 04/04/12 0344 04/03/12 1014 04/03/12 0404 04/02/12 0415  WBC 6.5 6.9 6.5 6.5 6.6  NEUTROABS -- -- -- -- --  HGB 12.6* 12.5* 12.0* 12.2* 12.4*  HCT 38.3* 38.3* 37.0* 38.0* 38.5*  MCV 99.0  99.2 100.0 100.5* 100.5*  PLT 376 374 328 320 285   Studies: No results found.  Scheduled Meds:   . docusate sodium  100 mg Oral BID  . folic acid  1 mg Oral Daily  . multivitamin with minerals  1 tablet Oral Daily  . nicotine  21 mg Transdermal Daily  . pantoprazole  40 mg Oral Q1200  . thiamine  100 mg Oral Daily  . vitamin B-12  1,000 mcg Oral Daily  . warfarin  25 mg Oral ONCE-1800  . warfarin  25 mg Oral ONCE-1800  . Warfarin - Pharmacist Dosing Inpatient   Does not apply q1800   Continuous Infusions:   . sodium chloride 50 mL/hr at 04/04/12 2236  . heparin 1,850 Units/hr (04/04/12 2236)

## 2012-04-05 NOTE — Progress Notes (Signed)
CSW received a call from [facility] stating that due to an emergency within their facility they would not be able to accept the Pt today but anticipate a bed opening tomorrow.   CSW will continue working on a discharge plan.   Leron Croak, LCSWA [employer] [phone]

## 2012-04-05 NOTE — Progress Notes (Signed)
Physical Therapy Treatment Patient Details Name: [name] MRN: [id number] DOB: 1965/11/19 Today's Date: 04/05/2012 Time: 4098-1191 PT Time Calculation (min): 19 min  PT Assessment / Plan / Recommendation Comments on Treatment Session  Pt improved with practice with straight cane.  He will need straight cane at discharge, but will not need PT follow up.    Follow Up Recommendations  No PT follow up    Barriers to Discharge        Equipment Recommendations  Cane    Recommendations for Other Services    Frequency Min 3X/week   Plan Discharge plan remains appropriate;Frequency remains appropriate    Precautions / Restrictions     Pertinent Vitals/Pain Pt with increased pain in left leg with ambulation and weight  bearing    Mobility  Bed Mobility Bed Mobility: Supine to Sit;Sit to Supine Supine to Sit: 7: Independent Sit to Supine: 7: Independent Transfers Transfers: Sit to Stand;Stand to Sit Sit to Stand: 7: Independent Stand to Sit: 7: Independent;With upper extremity assist;To chair/3-in-1 Ambulation/Gait Ambulation/Gait Assistance: 6: Modified independent (Device/Increase time) Ambulation Distance (Feet): 350 Feet Assistive device: Straight cane Ambulation/Gait Assistance Details: verbal cues for sequence with cane in right hand and stepping with left leg to take weight off leg Gait Pattern: Step-through pattern;Decreased step length - right;Decreased stance time - left;Antalgic Gait velocity: decreased General Gait Details: some pain with weight bearing on left leg, but pt able to use cane to take weight from it for safe ambulaton Stairs: No    Exercises     PT Diagnosis:    PT Problem List:   PT Treatment Interventions:     PT Goals Acute Rehab PT Goals PT Goal: Ambulate - Progress: Progressing toward goal  Visit Information  Last PT Received On: 04/05/12 Assistance Needed: +1    Subjective Data  Subjective: I've been wanting to walk Patient Stated  Goal: to be discharged from hospital   Cognition  Overall Cognitive Status: Appears within functional limits for tasks assessed/performed Arousal/Alertness: Awake/alert Orientation Level: Appears intact for tasks assessed Behavior During Session: [facility] for tasks performed    Balance  Balance Balance Assessed: No  End of Session PT - End of Session Activity Tolerance: Patient tolerated treatment well Patient left: in chair;with call bell/phone within reach Nurse Communication: Mobility status   GP     Donnetta Hail 04/05/2012, 2:42 PM

## 2012-04-05 NOTE — Progress Notes (Signed)
ANTICOAGULATION CONSULT NOTE - Follow up  Pharmacy Consult for Warfarin + Heparin Indication: VTE Treatment (acute/subacute extensive DVT)  No Known Allergies  Patient Measurements: Height: 6\' 2"  (188 cm) Weight: 190 lb 7.6 oz (86.4 kg) IBW/kg (Calculated) : 82.2  Heparin Dosing Weight: 86.4kg  Vital Signs: Temp: 98.8 F (37.1 C) (07/30 0420) Temp src: Oral (07/30 0420) BP: 121/70 mmHg (07/30 0420) Pulse Rate: 60  (07/30 0700)  Labs:  Basename 04/05/12 0515 04/05/12 0340 04/04/12 0344 04/03/12 1014 04/03/12 0404  HGB 12.6* -- 12.5* -- --  HCT 38.3* -- 38.3* 37.0* --  PLT 376 -- 374 328 --  APTT -- -- -- -- --  LABPROT -- 19.9* 17.6* -- 15.9*  INR -- 1.66* 1.42 -- 1.24  HEPARINUNFRC -- 0.51 0.32 -- 0.32  CREATININE -- -- -- 0.74 --  CKTOTAL -- -- -- -- --  CKMB -- -- -- -- --  TROPONINI -- -- -- -- --   Estimated Creatinine Clearance: 134.1 ml/min (by C-G formula based on Cr of 0.74).  Inpatient warfarin doses from 7/22: 10, 10, 12.5, 12.5, 15, 20, 25mg . 25 mg  Assessment:  46 yo homeless M with hx of lupus anticoagulant disorder, EtOH abuse, and PE s/p IVC filter presented to ER 03/28/12. Pt stated he was diagnosed with a LLE DVT a few weeks ago, was started on Xarelto, but was unable to afford medication (ran out of meds > 3 weeks ago). ER doppler showed acute/subacute extensive DVT.  Heparin and warfarin were started for DVT treatment 03/28/12 after Lovenox 80mg  x1 in ED. Coumadin score = 8.  Thrombocytopenic at baseline in setting of slight AST elevation and EtOH use but platelets now improved to wnl. H/H stable.  Coumadin education completed 7/26.  Pt mentioned that medicaid application had been submitted and expressed interest in changing back to Xarelto if his application was approved and this medication was covered since Xarelto would not require f/u labwork  Today is overlap day 9. Heparin level therapeutic on Heparin 1850 units/hr. No bleeding reported.  INR is  starting to move toward goal after 8 days of high dose Coumadin (see dosages above).  Doses all charted as given.    Goal of Therapy:  INR 2-3 Heparin level 0.3-0.7 units/ml Monitor platelets by anticoagulation protocol: Yes   Plan:  1) Continue Heparin infusion at 1850 units/hr. 2) Repeat coumadin 25 mg x 1 tonight. 3) Heparin level and PT/INR daily. 4) Needs heparin / warfarin overlap until INR>=2 on two consecutive days.  Clydene Fake PharmD 8:09 AM 04/05/2012

## 2012-04-05 NOTE — Progress Notes (Signed)
CSW contacted [facility] to assess for availability of a bed for Pt.   [facility] admissions coordinator stated that they will return a call by 12 noon for acceptance based on bed availability.  CSW will assess for a backup plan if no bed is available at time of d/c.   Leron Croak, LCSWA [employer] [phone]

## 2012-04-06 LAB — CBC
HCT: 38.8 % — ABNORMAL LOW (ref 39.0–52.0)
Hemoglobin: 12.6 g/dL — ABNORMAL LOW (ref 13.0–17.0)
MCH: 32.4 pg (ref 26.0–34.0)
MCHC: 32.5 g/dL (ref 30.0–36.0)
MCV: 99.7 fL (ref 78.0–100.0)
Platelets: 422 10*3/uL — ABNORMAL HIGH (ref 150–400)
RBC: 3.89 MIL/uL — ABNORMAL LOW (ref 4.22–5.81)
RDW: 14.6 % (ref 11.5–15.5)
WBC: 7.9 10*3/uL (ref 4.0–10.5)

## 2012-04-06 LAB — PROTIME-INR
INR: 1.76 — ABNORMAL HIGH (ref 0.00–1.49)
Prothrombin Time: 20.8 seconds — ABNORMAL HIGH (ref 11.6–15.2)

## 2012-04-06 MED ORDER — ENOXAPARIN SODIUM 100 MG/ML ~~LOC~~ SOLN
90.0000 mg | Freq: Two times a day (BID) | SUBCUTANEOUS | Status: DC
  Administered 2012-04-06 – 2012-04-08 (×5): 90 mg via SUBCUTANEOUS
  Filled 2012-04-06 (×7): qty 1

## 2012-04-06 MED ORDER — WARFARIN SODIUM 10 MG PO TABS
30.0000 mg | ORAL_TABLET | Freq: Once | ORAL | Status: AC
  Administered 2012-04-06: 30 mg via ORAL
  Filled 2012-04-06: qty 3

## 2012-04-06 NOTE — Progress Notes (Signed)
Per Arlys John, admissions RN at [facility], unable to offer treatment bed due to upcoming court dates 8/1, 8/8, and 8/28.  Per RNCM dispo pending psych consult.  CSW to follow and assist as needed.  Dellie Burns, MSW, [place] [phone] (coverage)

## 2012-04-06 NOTE — Progress Notes (Addendum)
ANTICOAGULATION CONSULT NOTE - Follow up  Pharmacy Consult for Warfarin + Heparin Indication: VTE Treatment (acute/subacute extensive DVT)  No Known Allergies  Patient Measurements: Height: 6\' 2"  (188 cm) Weight: 190 lb 7.6 oz (86.4 kg) IBW/kg (Calculated) : 82.2  Heparin Dosing Weight: 86.4kg  Vital Signs: Temp: 98.5 F (36.9 C) (07/31 0600) Temp src: Oral (07/31 0600) BP: 114/65 mmHg (07/31 0600) Pulse Rate: 59  (07/31 0600)  Labs:  Basename 04/06/12 0310 04/05/12 0515 04/05/12 0340 04/04/12 0344 04/03/12 1014  HGB 12.6* 12.6* -- -- --  HCT 38.8* 38.3* -- 38.3* --  PLT 422* 376 -- 374 --  APTT -- -- -- -- --  LABPROT 20.8* -- 19.9* 17.6* --  INR 1.76* -- 1.66* 1.42 --  HEPARINUNFRC 0.35 -- 0.51 0.32 --  CREATININE -- -- -- -- 0.74  CKTOTAL -- -- -- -- --  CKMB -- -- -- -- --  TROPONINI -- -- -- -- --   Estimated Creatinine Clearance: 134.1 ml/min (by C-G formula based on Cr of 0.74).  Inpatient warfarin doses from 7/22: 10, 10, 12.5, 12.5, 15, 20, 25, 25, 25 mg  Assessment:  46 yo homeless M with hx of lupus anticoagulant disorder, EtOH abuse, and PE s/p IVC filter presented to ER 03/28/12. Pt stated he was diagnosed with a LLE DVT a few weeks ago, was started on Xarelto, but was unable to afford medication (ran out of meds > 3 weeks ago). ER doppler showed acute/subacute extensive DVT.  Heparin and warfarin were started for DVT treatment 03/28/12 after Lovenox 80mg  x1 in ED. Coumadin score = 8.  Thrombocytopenic at baseline in setting of slight AST elevation and EtOH use but platelets now improved to wnl. H/H stable.  Coumadin education completed 7/26.  Pt mentioned that medicaid application had been submitted and expressed interest in changing back to Xarelto if his application was approved and this medication was covered since Xarelto would not require f/u labwork  Today is overlap day 10. Heparin level therapeutic on Heparin 1850 units/hr. No bleeding reported.    INR is slowly increasing after 9 days of high dose Coumadin (see dosages above).  Doses all charted as given.   Goal of Therapy:  INR 2-3 Heparin level 0.3-0.7 units/ml Monitor platelets by anticoagulation protocol: Yes   Plan:  1) Continue Heparin infusion at 1850 units/hr. 2) Coumadin 30 mg x 1 today - will give dose slightly earlier in the day today (1200) 3) Heparin level and PT/INR daily. 4) Needs heparin / warfarin overlap until INR>=2 on two consecutive days. 5) Note option to change to full dose Lovenox to avoid daily heparin levels  Gwen Her PharmD  [phone] 04/06/2012 7:22 AM   ADDENDUM:   Changing from heparin infusion to full dose lovenox Wt - 86.4kg, Scr wnl/stable, CrCl > 153ml/min  Plan:  D/C heparin and heparin levels Start Lovenox 90mg  sq q12h Continue to monitor and adjust dose as necessary  Gwen Her PharmD  [phone] 04/06/2012 10:47 AM

## 2012-04-06 NOTE — Progress Notes (Addendum)
[facility] PROGRESS NOTE  [name] ZOX:[id number] DOB: 1966-05-03 DOA: 03/28/2012 PCP: No primary provider on file.  Assessment/Plan: Principal Problem:  *DVT of lower limb, acute in pt with lupus anticoagulant disorder   Dopplers done 03/28/12, positive for acute DVT; patient requires high dose of coumadin, 25 mg   INR slowly trending upwards, today 1.76 (yesterday 1.66).   Switch IV heparin to SQ Lovenox in anticipation of d/c to [facility].  Coumadin per pharmacy.  Xarelto also an option if able to get pharmaceutical company to provide. Active Problems:  Left lower abdominal pain with questionable lipoma   Pelvic ultrasound and CT abd/pelvis done 03/31/2012 with findings which suggest that this may be an extension of patient's DVT.   Pain medication PRN.  Alcohol abuse   Patient was counseled on alcohol cessation   Alcohol abuse rehab on discharge.   Pt was made aware if he is drinking actively he is at higher risk of fall and also would not be considered coumadin candidate.  Tobacco abuse   Provided smoking cessation counseling.   Pt also made aware of higher risk of blood clots if continues to smoke.  Continue nicotine patch.  Hyponatremia   Resolved.  Lupus anticoagulant disorder   Pt will follow up with Dr. Gaylyn Rong upon discharge.  Code Status: Full code  Family Communication: No family members at bedside. Disposition Plan: D/C to [facility]- alcohol rehab once bed available.  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 LE dopplers demonstrated acute/subacute extensive DVT.  Medical Consultants:  None.  Other Consultants:  Pharmacy  Physical therapy: No PT follow  up.  Rolling walker with 5" wheels, cane.  Procedures:  Lawrence from a venous duplex 03/28/2012: DVT involving the common femoral, femoral, profunda femoral, and popliteal veins. SVT involving the greater saphenous vein at the origin.  Antibiotics:  None.  HPI/Subjective: Mr. Tith continues to have left lower leg swelling and discomfort. No dyspnea or cough. States his bowels are moving and his appetite is good.  Objective: Filed Vitals:   04/05/12 0700 04/05/12 1305 04/05/12 2050 04/06/12 0600  BP:  111/67 122/76 114/65  Pulse: 60 59 58 59  Temp:  98.6 F (37 C) 98.9 F (37.2 C) 98.5 F (36.9 C)  TempSrc:  Oral Oral Oral  Resp:  18 17 16   Height:      Weight:      SpO2:  97% 97% 98%    Intake/Output Summary (Last 24 hours) at 04/06/12 0947 Last data filed at 04/06/12 0700  Gross per 24 hour  Intake   2326 ml  Output      0 ml  Net   2326 ml    Exam: Gen:  NAD Cardiovascular:  RRR, No M/R/G Respiratory: Lungs CTAB Gastrointestinal: Abdomen soft, NT/ND with normal active bowel sounds. Extremities: LLE 3+ edema.  Data Reviewed: Basic Metabolic Panel:  Lab 04/03/12 8119  NA 137  K 3.7  CL 102  CO2 25  GLUCOSE 103*  BUN 9  CREATININE 0.74  CALCIUM 8.7  MG --  PHOS --   GFR Estimated Creatinine Clearance: 134.1 ml/min (by C-G formula based on Cr of 0.74). Coagulation profile  Lab 04/06/12 0310 04/05/12 0340 04/04/12 0344 04/03/12 0404 04/02/12 0415  INR  1.76* 1.66* 1.42 1.24 1.13  PROTIME -- -- -- -- --    CBC:  Lab 04/06/12 0310 04/05/12 0515 04/04/12 0344 04/03/12 1014 04/03/12 0404  WBC 7.9 6.5 6.9 6.5 6.5  NEUTROABS -- -- -- -- --  HGB 12.6* 12.6* 12.5* 12.0* 12.2*  HCT 38.8* 38.3* 38.3* 37.0* 38.0*  MCV 99.7 99.0 99.2 100.0 100.5*  PLT 422* 376 374 328 320   BNP (last 3 results)  Basename 01/25/12 1316  PROBNP 75.9    Studies:  Ct Angio Chest W/cm &/or Wo Cm 03/28/2012 IMPRESSION: No evidence for pulmonary embolism.  No acute chest  findings.  Original Report Authenticated By: Richarda Overlie, M.D.    US Pelvis Limited 04/01/2012 IMPRESSION:  Noncompressible, tubular structure containing internal homogeneous low level echoes in the anatomic position of the great saphenous vein.  Given the clinical findings of a palpable cord and overlying skin erythema, these findings are most consistent with superficial thrombophlebitis.  However, evaluation is limited as this is not a vascular ultrasound.  Consider further evaluation with dedicated left lower extremity Doppler ultrasound.  Original Report Authenticated By: Vilma Prader    Ct Abdomen Pelvis W Contrast 04/02/2012 IMPRESSION: No evidence of acute abnormality.  Continued inflammation/swelling of the upper left leg/lower left pelvis.  Original Report Authenticated By: Rosendo Gros, M.D.    Scheduled Meds:   . docusate sodium  100 mg Oral BID  . folic acid  1 mg Oral Daily  . multivitamin with minerals  1 tablet Oral Daily  . nicotine  21 mg Transdermal Daily  . pantoprazole  40 mg Oral Q1200  . thiamine  100 mg Oral Daily  . vitamin B-12  1,000 mcg Oral Daily  . warfarin  25 mg Oral ONCE-1800  . warfarin  30 mg Oral Once  . Warfarin - Pharmacist Dosing Inpatient   Does not apply q1800   Continuous Infusions:   . sodium chloride 50 mL/hr at 04/05/12 2153  . heparin 1,850 Units/hr (04/06/12 0136)    Time spent: 45 minutes, including time speaking with SW, psychiatrist, CM to outline a potential d/c plan.     LOS: 9 days   RAMA,CHRISTINA  [facility] Pager [phone].  If 8PM-8AM, please contact night-coverage at [email or web address], password [facility] 04/06/2012, 9:47 AM

## 2012-04-06 NOTE — Progress Notes (Signed)
Clinical Social Work Department CLINICAL SOCIAL WORK PSYCHIATRY SERVICE LINE ASSESSMENT 04/06/2012  Patient:  [name]  Account:  [id number]  Admit Date:  03/28/2012  Clinical Social Worker:  Doroteo Glassman  Date/Time:  04/06/2012 04:10 PM Referred by:  Physician  Date referred:  04/06/2012 Reason for Referral  Behavioral Health Issues   Presenting Symptoms/Problems (In the person's/family's own words):   Pt in need of ETOH tx    Abuse/Neglect/Trauma Comments:    Psychiatric medications:  none   Current Mental Health Hospitalizations/Previous Mental Health History:   Current provider:   Place and Date:   Current Medications:   See H&P   Previous Impatient Admission/Date/Reason:   Emotional Health / Current Symptoms    Suicide/Self Harm  None reported   Suicide attempt in the past:   Other harmful behavior:   Psychotic/Dissociative Symptoms  None reported   Other Psychotic/Dissociative Symptoms:    Attention/Behavioral Symptoms  Within Normal Limits   Other Attention / Behavioral Symptoms:    Cognitive Impairment  Within Normal Limits   Other Cognitive Impairment:    Mood and Adjustment  Anxious  Aggressive/frustrated    Stress, Anxiety, Trauma, Any Recent Loss/Stressor  Anxiety  Current Legal Problems/Pending Court Date   Anxiety (frequency):   Phobia (specify):   Compulsive behavior (specify):   Obsessive behavior (specify):   Other:   court dates on 8/1, 8/8, 8/28 for panhandling violations   Substance Abuse/Use  Current substance use   SBIRT completed (please refer for detailed history):    Self-reported substance use:   prior to admission   Urinary Drug Screen Completed:  Y Alcohol level:   less than 11    Environmental/Housing/Living Arrangement  HOMELESS   Who is in the home:   Emergency contact:  none    Patient's Strengths and Goals (patient's own words):   Clinical Social Worker's Interpretive Summary:   Met with  Pt to discuss d/c plans.    Pt was supposed to go to [facility] today but was declined by the facility due to upcoming court dates.    CSW made an appt for Pt at [facility] for August 7th at 8.    Pt was frustrated at his legal charges and reported feeling overwhelmed with his medical pxs.  Pt to call Financial  Counselor and speak with her again re: his disability claim.    Pt had information on SA resources that were given to him by unit CSW.    Pt had no additional psych needs.    CSW thanked Pt for his time.   Disposition:  Psych Clinical Social Worker signing off  [place], [place] Clinical Social Work [phone]

## 2012-04-07 ENCOUNTER — Other Ambulatory Visit: Payer: Self-pay | Admitting: Oncology

## 2012-04-07 LAB — CBC
MCH: 32.1 pg (ref 26.0–34.0)
Platelets: 462 10*3/uL — ABNORMAL HIGH (ref 150–400)
RBC: 3.92 MIL/uL — ABNORMAL LOW (ref 4.22–5.81)
RDW: 14.7 % (ref 11.5–15.5)

## 2012-04-07 LAB — PROTIME-INR: Prothrombin Time: 26.7 seconds — ABNORMAL HIGH (ref 11.6–15.2)

## 2012-04-07 MED ORDER — WARFARIN SODIUM 10 MG PO TABS
20.0000 mg | ORAL_TABLET | Freq: Once | ORAL | Status: AC
  Administered 2012-04-07: 20 mg via ORAL
  Filled 2012-04-07: qty 2

## 2012-04-07 NOTE — Progress Notes (Signed)
[facility] PROGRESS NOTE  Branch Pacitti OZH:[id number] DOB: [birth year] DOA: 03/28/2012 PCP: No primary provider on file.  Assessment/Plan: Principal Problem:  *DVT of lower limb, acute in pt with lupus anticoagulant disorder   Dopplers done 03/28/12, positive for acute DVT; patient requires high dose coumadin.  INR slowly trending upwards, today 2.42 .  D/C Lovenox 04/08/12 if INR remains therapeutic.  Coumadin per pharmacy.  Coumadin available for $4/month, so would not d/c on high cost alternative anti-coagulant, especially since patient has proven that he is at risk to be non-adherent to treatment recommendations. Active Problems:  Left lower abdominal pain with questionable lipoma   Pelvic ultrasound and CT abd/pelvis done 03/31/2012 with findings which suggest that this may be an extension of patient's DVT.   Pain medication PRN.  Alcohol abuse   Patient was counseled on alcohol cessation.  Evaluated by psychiatrist on 04/07/12, who confirms that he has capacity.  Will need referral to outpatient psychiatrist.  Alcohol abuse rehab on discharge. Appointment made at [facility] on 04/13/12 at 8:00 a.m. Given a bus pass to get to his scheduled appointment.  SW provided info to patient, and will be put on d/c follow up paperwork.  Pt was made aware if he is drinking actively he is at higher risk of fall and also would not be considered coumadin candidate.  Tobacco abuse   Provided smoking cessation counseling.   Pt also made aware of higher risk of blood clots if continues to smoke.  Continue nicotine patch.  Hyponatremia   Resolved.  Lupus anticoagulant disorder   Pt will follow up with Dr. Gaylyn Rong upon discharge.  Code Status: Full code  Family Communication: No family members at bedside. Disposition Plan: D/C home when stable, not a candidate for inpatient rehab.  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 LE dopplers demonstrated acute/subacute extensive DVT.  Medical Consultants:  Dr. Mickeal Skinner, Psychiatry  Other Consultants:  Pharmacy  Physical therapy: No PT follow up.  Rolling walker with 5" wheels, cane.  Procedures:  Venous duplex 03/28/2012: DVT involving the common femoral, femoral, profunda femoral, and popliteal veins. SVT involving the greater saphenous vein at the origin.  Antibiotics:  None.  HPI/Subjective: Mr. Kamphaus has no physical complaints except for some LLE swelling and discomfort.  No dyspnea or cough.  Appetite good.  Bowels moving.  Concerned about missing his court date.  Objective: Filed Vitals:   04/06/12 0600 04/06/12 1400 04/06/12 2100 04/07/12 0505  BP: 114/65 112/67 125/72 126/76  Pulse: 59 69 63 56  Temp: 98.5 F (36.9 C) 97.1 F (36.2 C) 98 F (36.7 C) 98.1 F (36.7 C)  TempSrc: Oral Axillary Oral Oral  Resp: 16 18 16 17   Height:      Weight:      SpO2: 98% 96% 97% 98%    Intake/Output Summary (Last 24 hours) at 04/07/12 1004 Last data filed at 04/07/12 0500  Gross per 24 hour  Intake   1339 ml  Output      0 ml  Net   1339 ml    Exam: Gen:  NAD Cardiovascular:  RRR, No M/R/G Respiratory: Lungs CTAB Gastrointestinal: Abdomen soft, NT/ND with normal active bowel sounds. Extremities: LLE 3+ edema.  Data Reviewed: Basic  Metabolic Panel:  Lab 04/03/12 1478  NA 137  K 3.7  CL 102  CO2 25  GLUCOSE 103*  BUN 9  CREATININE 0.74  CALCIUM 8.7  MG --  PHOS --   GFR Estimated Creatinine Clearance: 134.1 ml/min (by C-G formula based on Cr of 0.74). Coagulation profile  Lab 04/07/12 0320 04/06/12 0310 04/05/12 0340 04/04/12 0344 04/03/12 0404  INR 2.42* 1.76* 1.66* 1.42 1.24  PROTIME  -- -- -- -- --    CBC:  Lab 04/07/12 0320 04/06/12 0310 04/05/12 0515 04/04/12 0344 04/03/12 1014  WBC 6.9 7.9 6.5 6.9 6.5  NEUTROABS -- -- -- -- --  HGB 12.6* 12.6* 12.6* 12.5* 12.0*  HCT 39.1 38.8* 38.3* 38.3* 37.0*  MCV 99.7 99.7 99.0 99.2 100.0  PLT 462* 422* 376 374 328   BNP (last 3 results)  Basename 01/25/12 1316  PROBNP 75.9    Studies:  Ct Angio Chest W/cm &/or Wo Cm 03/28/2012 IMPRESSION: No evidence for pulmonary embolism.  No acute chest findings.  Original Report Authenticated By: Richarda Overlie, M.D.    US Pelvis Limited 04/01/2012 IMPRESSION:  Noncompressible, tubular structure containing internal homogeneous low level echoes in the anatomic position of the great saphenous vein.  Given the clinical findings of a palpable cord and overlying skin erythema, these findings are most consistent with superficial thrombophlebitis.  However, evaluation is limited as this is not a vascular ultrasound.  Consider further evaluation with dedicated left lower extremity Doppler ultrasound.  Original Report Authenticated By: Vilma Prader    Ct Abdomen Pelvis W Contrast 04/02/2012 IMPRESSION: No evidence of acute abnormality.  Continued inflammation/swelling of the upper left leg/lower left pelvis.  Original Report Authenticated By: Rosendo Gros, M.D.    Scheduled Meds:    . docusate sodium  100 mg Oral BID  . enoxaparin (LOVENOX) injection  90 mg Subcutaneous Q12H  . folic acid  1 mg Oral Daily  . multivitamin with minerals  1 tablet Oral Daily  . nicotine  21 mg Transdermal Daily  . pantoprazole  40 mg Oral Q1200  . thiamine  100 mg Oral Daily  . vitamin B-12  1,000 mcg Oral Daily  . warfarin  20 mg Oral ONCE-1800  . warfarin  30 mg Oral Once  . Warfarin - Pharmacist Dosing Inpatient   Does not apply q1800   Continuous Infusions:    . sodium chloride 10 mL/hr (04/06/12 1135)  . DISCONTD: heparin 1,850 Units/hr (04/06/12 0136)    Time spent: 35 minutes, including time speaking  with SW, CM to outline a potential d/c plan.     LOS: 10 days   Dora Clauss  [facility] Pager [phone].  If 8PM-8AM, please contact night-coverage at [email or web address], password [facility] 04/07/2012, 10:04 AM

## 2012-04-07 NOTE — Consult Note (Signed)
Patient Identification:  [name] Date of Evaluation:  04/07/2012 Reason for Consult:  Alcohol Dependence, seeks Rehab  Referring Provider:  Dr. Darnelle [name]  History of Present Illness: Pt is admitted for complicated DVT diagnosed ~ 3 wks ago. His L LE is erythematous, swollen and painful.  He has  Lupus Coagulation Disorder, a Pulmonary embolism s/p IVC filter. He requests rehab program because "I've been drinking alcohol to control the pain".  He is argumentative and says he uses alcohol because he cannot get pain medication and alcohol is cheaper.  He says he has been declined by [facility] because he has pending court hearings.  Past Psychiatric History:Alcohol Dependence, Tobacco Dependence, non-adherence to medical regimen   Past Medical History:     Past Medical History  Diagnosis Date  . Coronary artery disease   . Degenerative joint disease of spine   . Pulmonary embolism 06/2010  . Degenerative joint disease   . 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 ETOH abuse        Past Surgical History  Procedure Date  . Left elbow surgery   . Tonsillectomy     Allergies: No Known Allergies  Current Medications:  Prior to Admission medications   Medication Sig Start Date End Date Taking? Authorizing Provider  Aspirin-Caffeine (BC FAST PAIN RELIEF) 845-65 MG PACK Take 1 Package by mouth every 8 (eight) hours as needed. Pain   Yes Historical Provider, MD  ibuprofen (ADVIL,MOTRIN) 200 MG tablet Take 600 mg by mouth every 6 (six) hours as needed. Pain   Yes Historical Provider, MD  Rivaroxaban (XARELTO) 20 MG TABS Take 1 tablet (20 mg total) by mouth daily. 03/02/12  Yes [name] Shi, MD    Social History:    reports that he has been smoking Cigarettes.  He has a 90 pack-year smoking history. He has never used smokeless tobacco. He reports that he drinks alcohol. He reports that he does not use illicit drugs.   Family History:    History reviewed. No pertinent family history.  Mental  Status Examination/Evaluation: Objective:  Appearance: Fairly Groomed  Psychomotor Activity:  Increased  Eye Contact::  Minimal  Speech:  Pressured and loud  Volume:  Increased  Mood:  Angry and Irritable  Affect:  Blunt and Inappropriate  Thought Process:  Irrelevant, Disorganized and illogical, guarded  Orientation:  Other:  polarized  Thought Content:  Delusions  Suicidal Thoughts:  No  Homicidal Thoughts:  No  Judgement:  Impaired  Insight:  Lacking    DIAGNOSIS:   AXIS I   Poysubstance dependence  AXIS II  Deffered  AXIS III See medical notes.  AXIS IV economic problems, housing problems, problems related to legal system/crime, problems with access to health care services and problems with primary support group  AXIS V 51-60 moderate symptoms   Assessment/Plan:  Discussed with Dr. Darnelle [name] 04/06/12 am, and Psych CSW Pt is speaking loudly about his lack of funds not getting into rehab and having court dates for 'panhandling'.  He had a license and says police picked him up and kept his license.   He is complaining about leg pain ad problems getting his medication once he missed an appt.  He is self-absorbed and claims distorted reasons, incorrect one for not getting medication and replacing it with alcohol. RECOMMENDATION:  1.  Pt has capacity and uses his options unwisely. 2.  Serious DVT and therapy requires close follow up and medical intention. 3.  Support both rehab program and anticoagulant therapy.  5.  Pt has not followed medical treatment plan. And he also needs outpatient Psychiatrist for reinforcement of sobriety 5.  Refer pt to outpatient rehab center when ready for discharge and medically stable. 6  No further psychiatric needs identified unless requested  MD Psychaitrist [name] J. Ferol Luz, MD Psychiatrist  04/07/2012 1:55 AM [name] w/ Psych CSW 04/06/12].

## 2012-04-07 NOTE — Progress Notes (Signed)
Physical Therapy Treatment Patient Details Name: Montravious Weigelt MRN: [id number] DOB: [birth year] Today's Date: 04/07/2012 Time: 7829-5621 PT Time Calculation (min): 11 min  PT Assessment / Plan / Recommendation Comments on Treatment Session  Pt is independent in gait on levels and steps.  He is improved with use of straight cane due to ongoing back pain and resolving left leg pain.  We will be able to provide him a straight cane from our indigent closet.  PT goals met, will discontinue PT services and recommend that patient continue to walk with nursing ad lib.    Follow Up Recommendations  No PT follow up    Barriers to Discharge        Equipment Recommendations  Cane    Recommendations for Other Services    Frequency Min 3X/week   Plan Discharge plan remains appropriate;Frequency remains appropriate    Precautions / Restrictions     Pertinent Vitals/Pain Pt reports general sorenes,, but he thinks its from lying around too much. Pt states he is ready for discharge    Mobility  Bed Mobility Bed Mobility: Supine to Sit;Sit to Supine Supine to Sit: 7: Independent Sit to Supine: 7: Independent Transfers Transfers: Sit to Stand;Stand to Sit Sit to Stand: 7: Independent Stand to Sit: 7: Independent;With upper extremity assist;To chair/3-in-1 Ambulation/Gait Ambulation/Gait Assistance: 6: Modified independent (Device/Increase time) Ambulation Distance (Feet): 400 Feet Assistive device: Straight cane Ambulation/Gait Assistance Details: none needed Gait Pattern: Antalgic;Within Functional Limits Stairs: Yes Stairs Assistance: 6: Modified independent (Device/Increase time) Stair Management Technique: With cane;One rail Right Number of Stairs: 4  Wheelchair Mobility Wheelchair Mobility: No    Exercises     PT Diagnosis:    PT Problem List:   PT Treatment Interventions:     PT Goals Acute Rehab PT Goals PT Goal: Ambulate - Progress: Met  Visit Information  Last PT Received  On: 04/07/12 Assistance Needed: +1    Subjective Data  Subjective: I've been wanting to walk (i'm doing better) Patient Stated Goal: to be discharged from hospital   Cognition  Overall Cognitive Status: Appears within functional limits for tasks assessed/performed Arousal/Alertness: Awake/alert Orientation Level: Appears intact for tasks assessed Behavior During Session: [facility] for tasks performed    Balance  Balance Balance Assessed: No  End of Session PT - End of Session Activity Tolerance: Patient tolerated treatment well Patient left: in chair;with call bell/phone within reach Nurse Communication: Mobility status   GP     Rosey Bath K. Manson Passey [phone] 04/07/2012, 4:09 PM

## 2012-04-07 NOTE — Progress Notes (Signed)
ANTICOAGULATION CONSULT NOTE - Follow up  Pharmacy Consult for Warfarin + Heparin Indication: VTE Treatment (acute/subacute extensive DVT)  No Known Allergies  Patient Measurements: Height: 6\' 2"  (188 cm) Weight: 190 lb 7.6 oz (86.4 kg) IBW/kg (Calculated) : 82.2  Heparin Dosing Weight: 86.4kg  Vital Signs: Temp: 98.1 F (36.7 C) (08/01 0505) Temp src: Oral (08/01 0505) BP: 126/76 mmHg (08/01 0505) Pulse Rate: 56  (08/01 0505)  Labs:  Basename 04/07/12 0320 04/06/12 0310 04/05/12 0515 04/05/12 0340  HGB 12.6* 12.6* -- --  HCT 39.1 38.8* 38.3* --  PLT 462* 422* 376 --  APTT -- -- -- --  LABPROT 26.7* 20.8* -- 19.9*  INR 2.42* 1.76* -- 1.66*  HEPARINUNFRC -- 0.35 -- 0.51  CREATININE -- -- -- --  CKTOTAL -- -- -- --  CKMB -- -- -- --  TROPONINI -- -- -- --   Estimated Creatinine Clearance: 134.1 ml/min (by C-G formula based on Cr of 0.74).  Inpatient warfarin doses from 7/22: 10, 10, 12.5, 12.5, 15, 20, 25, 25, 25, 30 mg  Assessment:  46 yo homeless M with hx of lupus anticoagulant disorder, EtOH abuse, and PE s/p IVC filter. Pt presented to ER 03/28/12. Pt stated he was diagnosed with a LLE DVT a few weeks ago, was started on Xarelto, but was unable to afford medication (ran out of meds > 3 weeks ago). ER doppler showed acute/subacute extensive DVT.  Heparin and warfarin were started for DVT treatment 03/28/12 after Lovenox 80mg  x1 in ED. Coumadin score = 8.  Thrombocytopenic at baseline in setting of slight AST elevation and EtOH use but platelets now improved to wnl. H/H stable.  Coumadin education completed 7/26.  Pt mentioned that medicaid application had been submitted and expressed interest in changing back to Xarelto if his application was approved and this medication was covered since Xarelto would not require f/u labwork  Today is overlap day 11. Pt has been on heparin infusion but was changed to Lovenox full dose yesterday. No bleeding reported. CBC and Scr  ok.  INR is finally therapeutic after 10 days of high dose Coumadin. Total Coumadin dose over last 10 days = 185mg , avg daily dose 18.5mg .  Doses all charted as given.   Goal of Therapy:  INR 2-3 Monitor platelets by anticoagulation protocol: Yes   Plan:   Continue Lovenox 90mg  sq q12h, needs one more day of overlap w/INR >=2  Coumadin 20 mg x 1 today   PT/INR daily  Will need very close f/u of INR outpatient given requirement of very large doses of Coumadin to achieve a therapeutic INR and hx of ETOH.  Gwen Her PharmD  [phone] 04/07/2012 8:43 AM

## 2012-04-08 ENCOUNTER — Telehealth: Payer: Self-pay | Admitting: Oncology

## 2012-04-08 LAB — CBC
MCH: 32.1 pg (ref 26.0–34.0)
MCHC: 32.1 g/dL (ref 30.0–36.0)
Platelets: 405 10*3/uL — ABNORMAL HIGH (ref 150–400)
RBC: 3.93 MIL/uL — ABNORMAL LOW (ref 4.22–5.81)
RDW: 14.5 % (ref 11.5–15.5)

## 2012-04-08 LAB — PROTIME-INR: Prothrombin Time: 25.3 seconds — ABNORMAL HIGH (ref 11.6–15.2)

## 2012-04-08 MED ORDER — WARFARIN SODIUM 10 MG PO TABS: 20.0000 mg | ORAL_TABLET | Freq: Every day | ORAL | Status: DC

## 2012-04-08 MED ORDER — ADULT MULTIVITAMIN W/MINERALS CH: 1.0000 | ORAL_TABLET | Freq: Every day | ORAL | Status: DC

## 2012-04-08 MED ORDER — OXYCODONE HCL 5 MG PO TABS: 5.0000 mg | ORAL_TABLET | Freq: Four times a day (QID) | ORAL | Status: AC | PRN

## 2012-04-08 NOTE — Discharge Summary (Signed)
Physician Discharge Summary  Amair Shrout GLO:[id number] DOB: 1966-05-09 DOA: 03/28/2012  PCP: No primary provider on file.  Admit date: 03/28/2012 Discharge date: 04/08/2012  Recommendations for Outpatient Follow-up:  1. Close monitoring of PT/INR on high dose coumadin (F/U arranged with Dr. Gaylyn Rong on 04/12/12 at 9:30 a.m.)  Given a prescription for coumadin x 1 month with 1 refill only. 2. F/U at [facility] 04/13/12 at 8:00 a.m. For alcohol abuse treatment.  Discharge Diagnoses:   Principal Problem:  *DVT of lower limb, acute  Active Problems:   Alcohol abuse   Tobacco abuse   Hyponatremia   Lupus anticoagulant disorder   Discharge Condition: Improved.  Diet recommendation: General.  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 LE dopplers demonstrated acute/subacute extensive DVT.  Of note, he also has a history of medical nonadherence and failure to follow up with pre-scheduled appointments.  Hospital Course by problem:  Principal Problem:  *DVT of lower limb, acute in pt with lupus anticoagulant disorder  Dopplers done 03/28/12, positive for acute DVT; Patient requires high dose coumadin and will need close follow up.  INR therapeutic x 48 hours, maintained on double coverage until this morning.  Coumadin available for $4/month, so would not d/c on high cost alternative anti-coagulant, especially since patient has proven that he is at risk to be non-adherent to treatment recommendations.  The patient understands that coumadin is available for $4/month and understands the importance of following up with Dr. Gaylyn Rong next week.  Given a bus pass to enable him to get to his  appointment. Active Problems:  Left lower abdominal pain with questionable lipoma  Pelvic ultrasound and CT abd/pelvis done 03/31/2012 with findings which suggest that this may be an extension of patient's DVT.  Pain medication PRN.  Alcohol abuse  Patient was counseled on alcohol cessation.  Evaluated by psychiatrist on 04/07/12, who confirms that he has capacity. Will need referral to outpatient psychiatrist.  Alcohol abuse rehab on discharge. Appointment made at [facility] on 04/13/12 at 8:00 a.m. Given a bus pass to get to his scheduled appointment. SW provided info to patient, and will be put on d/c follow up paperwork.  Pt was made aware if he is drinking actively he is at higher risk of fall and also would not be considered coumadin candidate.  Tobacco abuse  Provided smoking cessation counseling.  Pt also made aware of higher risk of blood clots if continues to smoke.  Continue nicotine patch.  Hyponatremia  Resolved.  Lupus anticoagulant disorder  Pt will follow up with Dr. Gaylyn Rong upon discharge.  Procedures:  Venous duplex 03/28/2012: DVT involving the common femoral, femoral, profunda femoral, and popliteal veins. SVT involving the greater saphenous vein at the origin.   Consultations:  Dr. Mickeal Skinner, Psychiatry.  Discharge Exam: Filed Vitals:   04/08/12 0525  BP: 110/70  Pulse: 60  Temp: 97.9 F (36.6 C)  Resp: 16   Filed Vitals:   04/07/12 0505 04/07/12 1340 04/07/12 2100 04/08/12 0525  BP: 126/76 111/69 136/80 110/70  Pulse: 56 64 61 60  Temp: 98.1 F (36.7 C)  98 F (36.7 C) 97.9 F (36.6 C)  TempSrc: Oral Oral Oral Oral  Resp: 17 18 14 16   Height:  Weight:      SpO2: 98% 97% 98% 95%    Gen:  NAD Cardiovascular:  RRR, No M/R/G Respiratory: Lungs CTAB Gastrointestinal: Abdomen soft, NT/ND with normal active bowel sounds. Extremities: LLE swollen.   Discharge Instructions  Discharge Orders    Future Orders Please Complete By Expires   Diet  general      Increase activity slowly      Scheduling Instructions:   Use cane provided to you.   Discharge instructions      Comments:   This serves as Medical illustrator for proof of hospitalization at [facility] from 03/28/12-04/08/12.   Call MD for:  severe uncontrolled pain      Call MD for:      Scheduling Instructions:   Worsening leg swelling, signs of bleeding (weakness, dizziness, blood in the stools, shortness of breath)   Call MD for:  persistant dizziness or light-headedness      Call MD for:  extreme fatigue        Medication List  As of 04/08/2012  8:49 AM   STOP taking these medications         Rivaroxaban 20 MG Tabs         TAKE these medications         BC FAST PAIN RELIEF 845-65 MG Pack   Generic drug: Aspirin-Caffeine   Take 1 Package by mouth every 8 (eight) hours as needed. Pain      ibuprofen 200 MG tablet   Commonly known as: ADVIL,MOTRIN   Take 600 mg by mouth every 6 (six) hours as needed. Pain      multivitamin with minerals Tabs   Take 1 tablet by mouth daily.      oxyCODONE 5 MG immediate release tablet   Commonly known as: Oxy IR/ROXICODONE   Take 1 tablet (5 mg total) by mouth every 6 (six) hours as needed.      warfarin 10 MG tablet   Commonly known as: COUMADIN   Take 2 tablets (20 mg total) by mouth daily.           Follow-up Information    Follow up with [facility] on 04/13/2012. (8:00 a.m.)    Contact information:   Address: [address] [place], [place] [address] Phone: [phone]       Follow up with Jethro Bolus, MD on 04/12/2012. (9:30 a.m.  It is very important for you to keep this  appt.)    Contact information:   [address] [place] [address] [phone]           The results of significant diagnostics from this hospitalization (including imaging, microbiology, ancillary and laboratory) are listed below for reference.    Significant Diagnostic Studies: Ct Angio Chest W/cm &/or Wo Cm  03/28/2012 IMPRESSION: No evidence for pulmonary embolism. No acute chest findings. Original Report Authenticated By: Richarda Overlie, M.D.  US Pelvis Limited 04/01/2012 IMPRESSION: Noncompressible, tubular structure containing internal homogeneous low level echoes in the anatomic position of the great saphenous vein. Given the clinical findings of a palpable cord and overlying skin erythema, these findings are most consistent with superficial thrombophlebitis. However, evaluation is limited as this is not a vascular ultrasound. Consider further evaluation with dedicated left lower extremity Doppler ultrasound. Original Report Authenticated By: Vilma Prader  Ct Abdomen Pelvis W Contrast 04/02/2012 IMPRESSION: No evidence of acute abnormality. Continued inflammation/swelling of the upper left leg/lower left pelvis. Original Report Authenticated By: Rosendo Gros, M.D.  Microbiology: No results found for this or any previous visit (from the past 240 hour(s)).   Labs: Basic Metabolic Panel:  Lab 04/03/12 1610  NA 137  K 3.7  CL 102  CO2 25  GLUCOSE 103*  BUN 9  CREATININE 0.74  CALCIUM 8.7  MG --  PHOS --   CBC:  Lab 04/08/12 0330 04/07/12 0320 04/06/12 0310 04/05/12 0515 04/04/12 0344  WBC 7.1 6.9 7.9 6.5 6.9  NEUTROABS -- -- -- -- --  HGB 12.6* 12.6* 12.6* 12.6* 12.5*  HCT 39.2 39.1 38.8* 38.3* 38.3*  MCV 99.7 99.7 99.7 99.0 99.2  PLT 405* 462* 422* 376 374   BNP (last 3 results)  Basename 01/25/12 1316  PROBNP 75.9   Time coordinating discharge: 35 minutes.  Signed:  Karrisa Didio  Pager [phone] [facility] 04/08/2012, 8:49 AM

## 2012-04-08 NOTE — Telephone Encounter (Signed)
Appt have been given to patient while in hospital by MD, pt is homeless hence pt was not called

## 2012-04-08 NOTE — Progress Notes (Signed)
Pt to be d/c today to [facility].   Pt provided with bus pass.   No further needs at this time.     Leron Croak, LCSWA [employer] [phone]

## 2012-04-11 ENCOUNTER — Telehealth: Payer: Self-pay | Admitting: Oncology

## 2012-04-11 NOTE — Telephone Encounter (Signed)
Del.04/11/12 Ref. Dr.Chris Rama Dx.DVT

## 2012-04-12 ENCOUNTER — Ambulatory Visit: Payer: Self-pay

## 2012-04-12 ENCOUNTER — Telehealth: Payer: Self-pay | Admitting: Oncology

## 2012-04-12 ENCOUNTER — Other Ambulatory Visit (INDEPENDENT_AMBULATORY_CARE_PROVIDER_SITE_OTHER): Payer: Medicaid Other | Admitting: Lab

## 2012-04-12 ENCOUNTER — Other Ambulatory Visit: Payer: Self-pay | Admitting: Pharmacist

## 2012-04-12 ENCOUNTER — Ambulatory Visit (HOSPITAL_BASED_OUTPATIENT_CLINIC_OR_DEPARTMENT_OTHER): Payer: Self-pay | Admitting: Oncology

## 2012-04-12 ENCOUNTER — Encounter: Payer: Self-pay | Admitting: Oncology

## 2012-04-12 VITALS — BP 134/81 | HR 80 | Temp 97.4°F | Resp 18 | Ht 74.0 in | Wt 198.0 lb

## 2012-04-12 DIAGNOSIS — I82409 Acute embolism and thrombosis of unspecified deep veins of unspecified lower extremity: Secondary | ICD-10-CM

## 2012-04-12 DIAGNOSIS — Z590 Homelessness: Secondary | ICD-10-CM

## 2012-04-12 DIAGNOSIS — I82403 Acute embolism and thrombosis of unspecified deep veins of lower extremity, bilateral: Secondary | ICD-10-CM

## 2012-04-12 DIAGNOSIS — I2699 Other pulmonary embolism without acute cor pulmonale: Secondary | ICD-10-CM

## 2012-04-12 DIAGNOSIS — D6859 Other primary thrombophilia: Secondary | ICD-10-CM

## 2012-04-12 LAB — PROTIME-INR

## 2012-04-12 NOTE — Telephone Encounter (Signed)
appts made and printed for pt aom °

## 2012-04-12 NOTE — Progress Notes (Signed)
[facility]  [phone] [phone]   OFFICE PROGRESS NOTE   Cc:  No primary provider on file.  DIAGNOSIS: DVT of the Left lower extremity. Patient is lupus anticoagulant positive. Patient has a history of pulmonary embolism in October 2011.  PAST THERAPY: Patient received Coumadin in October 2011 for a PE, but stopped this in December 2012. S/P IVC filter placement in October 2011.  CURRENT THERAPY: Coumadin 20 mg daily. Goal INR is 2-3.  INTERVAL HISTORY: [name] 46 y.o. male returns for hospital follow-up. The patient was initially hospitalized in May 2013 for LLE DVT. He was scheduled to follow-up with [place] as an outpatient, but did not come to his appointment. The patient ran out of his Coumadin and again presented to the hospital in July 2013 due to LLE swelling and redness. He is here to follow-up today to get established with our Coumadin clinic for monitoring of his INR. The patient is taking his Coumadin without difficulty. Denies any bleeding. He continues to have pain and swelling to his LLE, but the redness has resolved. No chest pain. Has his baseline shortness of breath. No abdominal pain, nausea, or vomiting.   Past Medical History  Diagnosis Date  . Coronary artery disease   . Degenerative joint disease of spine   . Pulmonary embolism 06/2010  . Degenerative joint disease   . 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 ETOH abuse     Past Surgical History  Procedure Date  . Left elbow surgery   . Tonsillectomy     Current Outpatient Prescriptions  Medication Sig Dispense Refill  . Aspirin-Caffeine (BC FAST PAIN RELIEF) 845-65 MG PACK Take 1 Package by mouth every 8 (eight) hours as needed. Pain      . ibuprofen (ADVIL,MOTRIN) 200 MG tablet Take 600 mg by mouth every 6 (six) hours as needed. Pain      . Multiple Vitamin (MULTIVITAMIN WITH MINERALS) TABS Take 1 tablet by mouth daily.      Marland Kitchen oxyCODONE (OXY IR/ROXICODONE) 5 MG  immediate release tablet Take 1 tablet (5 mg total) by mouth every 6 (six) hours as needed.  20 tablet  0  . warfarin (COUMADIN) 10 MG tablet Take 2 tablets (20 mg total) by mouth daily.  60 tablet  1    ALLERGIES:   has no known allergies.  REVIEW OF SYSTEMS:  The rest of the 14-point review of system was negative.   Filed Vitals:   04/12/12 1024  BP: 134/81  Pulse: 80  Temp: 97.4 F (36.3 C)  Resp: 18   Wt Readings from Last 3 Encounters:  04/12/12 198 lb (89.812 kg)  03/28/12 190 lb 7.6 oz (86.4 kg)  02/27/12 174 lb (78.926 kg)   ECOG Performance status: 1  PHYSICAL EXAMINATION: General:  well-nourished in no acute distress.  Eyes:  no scleral icterus.  ENT:  There were no oropharyngeal lesions.  Neck was without thyromegaly.  Lymphatics:  Negative cervical, supraclavicular or axillary adenopathy.  Respiratory: lungs were clear bilaterally without wheezing or crackles.  Cardiovascular:  Regular rate and rhythm, S1/S2, without murmur, rub or gallop.  There was no pedal edema to the right leg. Left lower extremity with 2+ edema. No redness.  GI:  abdomen was soft, flat, nontender, nondistended, without organomegaly.  Muscoloskeletal:  no spinal tenderness of palpation of vertebral spine.  Skin exam was without echymosis, petichae.  Neuro exam was nonfocal.  Patient was able to get on and off  exam table without assistance.  Gait was normal.  Patient was alerted and oriented.  Attention was good.   Language was appropriate.  Mood was normal without depression.  Speech was not pressured.  Thought content was not tangential.     LABORATORY/RADIOLOGY DATA:  Lab Results  Component Value Date   WBC 7.1 04/08/2012   HGB 12.6* 04/08/2012   HCT 39.2 04/08/2012   PLT 405* 04/08/2012   GLUCOSE 103* 04/03/2012   CHOL 195 03/02/2011   TRIG 147 03/02/2011   HDL 96 03/02/2011   LDLCALC 70 03/02/2011   ALKPHOS 88 03/28/2012   ALT 28 03/28/2012   AST 38* 03/28/2012   NA 137 04/03/2012   K 3.7 04/03/2012    CL 102 04/03/2012   CREATININE 0.74 04/03/2012   BUN 9 04/03/2012   CO2 25 04/03/2012   INR 4.36* 04/12/2012   HGBA1C 5.3 03/02/2011    Ct Angio Chest W/cm &/or Wo Cm  03/28/2012  *RADIOLOGY REPORT*  Clinical Data: Shortness of breath. History of left lower extremity pain and swelling.  CT ANGIOGRAPHY CHEST  Technique:  Multidetector CT imaging of the chest using the standard protocol during bolus administration of intravenous contrast. Multiplanar reconstructed images including MIPs were obtained and reviewed to evaluate the vascular anatomy.  Contrast: OMNIPAQUE IOHEXOL 350 MG/ML SOLN  Comparison: 01/22/2012  Findings: There is no evidence for pulmonary embolism.  There is no significant mediastinal, hilar or axillary lymphadenopathy.  There is no significant pericardial or pleural effusion.  There is motion artifact in the lung bases.  Images of the upper abdomen are unremarkable.  Incidentally, there is a common origin from the left common carotid artery and right innominate artery which is a normal anatomic variant.  No evidence for aortic aneurysmal dilatation.  The trachea and mainstem bronchi are patent.  There is a stable 8 mm calcified granuloma in the superior segment of the left lower lobe.  There is a stable linear density along the base of the right lower lobe which is most compatible with area of scarring. Otherwise, the lungs are clear.  There may be stable scarring along the medial aspect the right upper lobe.  No acute bony abnormality. There is an old fracture of the left tenth rib.  IMPRESSION: No evidence for pulmonary embolism.  No acute chest findings.  Original Report Authenticated By: Richarda Overlie, M.D.   US Pelvis Limited  04/01/2012  *RADIOLOGY REPORT*  Clinical Data: Painful left upper thigh mass, history of prior DVT  US PELVIS LIMITED OR FOLLOW UP  Technique:  Transabdominal ultrasound examination of the pelvis was performed including evaluation of the uterus, ovaries, adnexal  regions, and pelvic cul-de-sac.  Comparison: None  Findings: In the region of palpable concern, limited gray scale and color Doppler sonogram demonstrates a tubular structure containing homogeneous low level internal echoes.  In cross-section, the tubular structure measures 5 x 8 mm.  The entirety of the finding is not well seen in one view, and so the length cannot be measured. However, it measures larger than 3.5 cm.   There is no surrounding fluid collection. Anatomically, this tubular structure corresponds with a great saphenous vein.  IMPRESSION:  Noncompressible, tubular structure containing internal homogeneous low level echoes in the anatomic position of the great saphenous vein.  Given the clinical findings of a palpable cord and overlying skin erythema, these findings are most consistent with superficial thrombophlebitis.  However, evaluation is limited as this is not a vascular ultrasound.  Consider further evaluation with dedicated left lower extremity Doppler ultrasound.  Original Report Authenticated By: Vilma Prader   Ct Abdomen Pelvis W Contrast  04/02/2012  *RADIOLOGY REPORT*  Clinical Data: 46 year old male with left-sided abdominal and pelvic pain.  CT ABDOMEN AND PELVIS WITH CONTRAST  Technique:  Multidetector CT imaging of the abdomen and pelvis was performed following the standard protocol during bolus administration of intravenous contrast.  Contrast:  100 ml intravenous Omnipaque-300  Comparison: 01/26/2012 and 06/27/2010 CTs  Findings: The liver, spleen, pancreas, adrenal glands, kidneys and gallbladder are unremarkable. No free fluid, enlarged lymph nodes, biliary dilation or abdominal aortic aneurysm identified.  An IVC filter is again noted.  The bowel and appendix are unremarkable. Mild circumferential bladder wall thickening has improved. Stranding and enlargement of the left lower pelvis and upper left leg are again noted.  There is no evidence of bowel obstruction or pneumoperitoneum.  No  acute or suspicious bony abnormalities are identified.  IMPRESSION: No evidence of acute abnormality.  Continued inflammation/swelling of the upper left leg/lower left pelvis.  Original Report Authenticated By: Rosendo Gros, M.D.    ASSESSMENT AND PLAN:   1. Left lower extremity DVT. Patient positive for antiphospholipid antibody. Plan is for anticoagulation with Coumadin to maintain INR 2-3. He has been set up here at the Coumadin clinic for 04/18/12 at 10:15 for lab and 10:30 Coumadin clinic appointment. INR is 4.36 today. He was instructed to hold Coumadin today then take Coumadin 15 mg daily. Plan is for indefinite anticoagulation.  2. 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  3. Alcohol abuse. He has been set-up with [facility]. 4. Social issues. The patient is homeless and has difficulty with transportation. I have referred him to the SW to discuss transportation. He also states that he may have difficulty making to the [facility] appointment tomorrow due to transportation. I have also referred this to SW. 5. Follow-up. Coumadin clinic on 04/18/12 and RV in 2-3 months.     The length of time of the face-to-face encounter was 30  minutes. More than 50% of time was spent counseling and coordination of care.

## 2012-04-12 NOTE — Patient Instructions (Addendum)
Your INR is elevated today.  Do not take your Coumadin (warfarin) today 04/12/12. Resume your Coumadin (warfarin) on 04/13/12 at 15 mg daily. [facility] will [facility] on [facility]18/12 at 10:15 am for a lab recheck and then be seen in the Coumadin clinic at 10:30. You will have a follow-up visit with Dr Gaylyn Rong in about 8 weeks.

## 2012-04-13 ENCOUNTER — Other Ambulatory Visit: Payer: Self-pay | Admitting: Pharmacist

## 2012-04-13 DIAGNOSIS — D6862 Lupus anticoagulant syndrome: Secondary | ICD-10-CM

## 2012-04-13 DIAGNOSIS — I82409 Acute embolism and thrombosis of unspecified deep veins of unspecified lower extremity: Secondary | ICD-10-CM | POA: Insufficient documentation

## 2012-04-18 ENCOUNTER — Other Ambulatory Visit: Payer: Self-pay | Admitting: Lab

## 2012-04-18 ENCOUNTER — Ambulatory Visit: Payer: Self-pay

## 2012-04-25 ENCOUNTER — Other Ambulatory Visit: Payer: Self-pay | Admitting: Pharmacist

## 2012-04-27 ENCOUNTER — Telehealth: Payer: Self-pay | Admitting: *Deleted

## 2012-04-27 NOTE — Telephone Encounter (Signed)
Call from FNP, at [facility] in [place].  States trying to f/u on pt's anticoagulation.  Informed that pt did not make it to coumadin clinic visit here as scheduled on 04/18/12.  She says they will manage pt's coumadin or call us back for appt for coumadin clinic.  Faxed last office visit note them att; Dr. Christiana Pellant at fax # [phone],  For continuity of care.

## 2012-05-20 ENCOUNTER — Inpatient Hospital Stay (HOSPITAL_COMMUNITY)
Admission: EM | Admit: 2012-05-20 | Discharge: 2012-05-24 | DRG: 948 | Disposition: A | Discharge status: Home / Self Care | Payer: Medicaid Other | Attending: Internal Medicine | Admitting: Internal Medicine

## 2012-05-20 ENCOUNTER — Inpatient Hospital Stay (HOSPITAL_COMMUNITY): Payer: Medicaid Other

## 2012-05-20 ENCOUNTER — Encounter (HOSPITAL_COMMUNITY): Payer: Self-pay

## 2012-05-20 DIAGNOSIS — M1990 Unspecified osteoarthritis, unspecified site: Secondary | ICD-10-CM

## 2012-05-20 DIAGNOSIS — Z720 Tobacco use: Secondary | ICD-10-CM

## 2012-05-20 DIAGNOSIS — R109 Unspecified abdominal pain: Secondary | ICD-10-CM | POA: Diagnosis present

## 2012-05-20 DIAGNOSIS — R791 Abnormal coagulation profile: Principal | ICD-10-CM | POA: Diagnosis present

## 2012-05-20 DIAGNOSIS — R319 Hematuria, unspecified: Secondary | ICD-10-CM | POA: Diagnosis present

## 2012-05-20 DIAGNOSIS — T45515A Adverse effect of anticoagulants, initial encounter: Secondary | ICD-10-CM | POA: Diagnosis present

## 2012-05-20 DIAGNOSIS — F1010 Alcohol abuse, uncomplicated: Secondary | ICD-10-CM | POA: Diagnosis present

## 2012-05-20 DIAGNOSIS — Y92009 Unspecified place in unspecified non-institutional (private) residence as the place of occurrence of the external cause: Secondary | ICD-10-CM

## 2012-05-20 DIAGNOSIS — F17200 Nicotine dependence, unspecified, uncomplicated: Secondary | ICD-10-CM | POA: Diagnosis present

## 2012-05-20 DIAGNOSIS — I82409 Acute embolism and thrombosis of unspecified deep veins of unspecified lower extremity: Secondary | ICD-10-CM | POA: Diagnosis present

## 2012-05-20 DIAGNOSIS — D649 Anemia, unspecified: Secondary | ICD-10-CM | POA: Diagnosis present

## 2012-05-20 DIAGNOSIS — D6862 Lupus anticoagulant syndrome: Secondary | ICD-10-CM

## 2012-05-20 DIAGNOSIS — E876 Hypokalemia: Secondary | ICD-10-CM

## 2012-05-20 DIAGNOSIS — Z86718 Personal history of other venous thrombosis and embolism: Secondary | ICD-10-CM

## 2012-05-20 DIAGNOSIS — E871 Hypo-osmolality and hyponatremia: Secondary | ICD-10-CM | POA: Diagnosis present

## 2012-05-20 DIAGNOSIS — W19XXXA Unspecified fall, initial encounter: Secondary | ICD-10-CM | POA: Diagnosis present

## 2012-05-20 DIAGNOSIS — T45511A Poisoning by anticoagulants, accidental (unintentional), initial encounter: Secondary | ICD-10-CM

## 2012-05-20 DIAGNOSIS — I82403 Acute embolism and thrombosis of unspecified deep veins of lower extremity, bilateral: Secondary | ICD-10-CM

## 2012-05-20 DIAGNOSIS — I2510 Atherosclerotic heart disease of native coronary artery without angina pectoris: Secondary | ICD-10-CM | POA: Diagnosis present

## 2012-05-20 HISTORY — DX: Acute embolism and thrombosis of unspecified deep veins of unspecified lower extremity: I82.409

## 2012-05-20 LAB — COMPREHENSIVE METABOLIC PANEL
ALT: 18 U/L (ref 0–53)
CO2: 22 mEq/L (ref 19–32)
Calcium: 8.7 mg/dL (ref 8.4–10.5)
Chloride: 95 mEq/L — ABNORMAL LOW (ref 96–112)
Creatinine, Ser: 0.56 mg/dL (ref 0.50–1.35)
GFR calc Af Amer: >90 mL/min (ref >90)
GFR calc non Af Amer: >90 mL/min (ref >90)
Glucose, Bld: 75 mg/dL (ref 70–99)
Total Bilirubin: 0.3 mg/dL (ref 0.3–1.2)

## 2012-05-20 LAB — CBC WITH DIFFERENTIAL/PLATELET
Eosinophils Relative: 0 % (ref 0–5)
HCT: 31.5 % — ABNORMAL LOW (ref 39.0–52.0)
Hemoglobin: 10.7 g/dL — ABNORMAL LOW (ref 13.0–17.0)
Lymphocytes Relative: 19 % (ref 12–46)
Lymphs Abs: 1.5 10*3/uL (ref 0.7–4.0)
MCV: 93.2 fL (ref 78.0–100.0)
Monocytes Absolute: 0.7 10*3/uL (ref 0.1–1.0)
Monocytes Relative: 9 % (ref 3–12)
Neutro Abs: 5.7 10*3/uL (ref 1.7–7.7)
RBC: 3.38 MIL/uL — ABNORMAL LOW (ref 4.22–5.81)
WBC: 8 10*3/uL (ref 4.0–10.5)

## 2012-05-20 LAB — PROTIME-INR: INR: >10 (ref 0.00–1.49)

## 2012-05-20 LAB — ETHANOL: Alcohol, Ethyl (B): <11 mg/dL (ref 0–11)

## 2012-05-20 LAB — TYPE AND SCREEN

## 2012-05-20 LAB — HEMOGLOBIN AND HEMATOCRIT, BLOOD: Hemoglobin: 9.8 g/dL — ABNORMAL LOW (ref 13.0–17.0)

## 2012-05-20 MED ORDER — PANTOPRAZOLE SODIUM 40 MG IV SOLR
40.0000 mg | Freq: Two times a day (BID) | INTRAVENOUS | Status: DC
  Administered 2012-05-21 – 2012-05-22 (×2): 40 mg via INTRAVENOUS
  Filled 2012-05-20 (×6): qty 40

## 2012-05-20 MED ORDER — IOHEXOL 300 MG/ML  SOLN
20.0000 mL | INTRAMUSCULAR | Status: AC
  Administered 2012-05-20 (×2): 20 mL via ORAL

## 2012-05-20 MED ORDER — FOLIC ACID 1 MG PO TABS
1.0000 mg | ORAL_TABLET | Freq: Every day | ORAL | Status: DC
  Administered 2012-05-21 – 2012-05-24 (×4): 1 mg via ORAL
  Filled 2012-05-20 (×4): qty 1

## 2012-05-20 MED ORDER — ONDANSETRON HCL 4 MG PO TABS: 4.0000 mg | ORAL_TABLET | Freq: Four times a day (QID) | ORAL | Status: DC | PRN

## 2012-05-20 MED ORDER — SODIUM CHLORIDE 0.9 % IV SOLN
INTRAVENOUS | Status: AC
  Administered 2012-05-21: 75 mL/h via INTRAVENOUS

## 2012-05-20 MED ORDER — ADULT MULTIVITAMIN W/MINERALS CH
1.0000 | ORAL_TABLET | Freq: Every day | ORAL | Status: DC
  Administered 2012-05-21 – 2012-05-24 (×4): 1 via ORAL
  Filled 2012-05-20 (×4): qty 1

## 2012-05-20 MED ORDER — THIAMINE HCL 100 MG/ML IJ SOLN
100.0000 mg | Freq: Every day | INTRAMUSCULAR | Status: DC
  Filled 2012-05-20 (×4): qty 1

## 2012-05-20 MED ORDER — ACETAMINOPHEN 650 MG RE SUPP: 650.0000 mg | Freq: Four times a day (QID) | RECTAL | Status: DC | PRN

## 2012-05-20 MED ORDER — SODIUM CHLORIDE 0.9 % IV SOLN: INTRAVENOUS | Status: DC

## 2012-05-20 MED ORDER — ONDANSETRON HCL 4 MG/2ML IJ SOLN: 4.0000 mg | Freq: Four times a day (QID) | INTRAMUSCULAR | Status: DC | PRN

## 2012-05-20 MED ORDER — LORAZEPAM 2 MG/ML IJ SOLN: 0.0000 mg | Freq: Four times a day (QID) | INTRAMUSCULAR | Status: AC

## 2012-05-20 MED ORDER — SODIUM CHLORIDE 0.9 % IV SOLN
INTRAVENOUS | Status: DC
  Administered 2012-05-20: 21:00:00 via INTRAVENOUS

## 2012-05-20 MED ORDER — OXYCODONE HCL 5 MG PO TABS
5.0000 mg | ORAL_TABLET | ORAL | Status: DC | PRN
  Administered 2012-05-21 – 2012-05-24 (×9): 5 mg via ORAL
  Filled 2012-05-20 (×9): qty 1

## 2012-05-20 MED ORDER — SODIUM CHLORIDE 0.9 % IV BOLUS (SEPSIS): 500.0000 mL | Freq: Once | INTRAVENOUS | Status: DC

## 2012-05-20 MED ORDER — ZOLPIDEM TARTRATE 5 MG PO TABS: 5.0000 mg | ORAL_TABLET | Freq: Every evening | ORAL | Status: DC | PRN

## 2012-05-20 MED ORDER — ALUM & MAG HYDROXIDE-SIMETH 200-200-20 MG/5ML PO SUSP
30.0000 mL | Freq: Four times a day (QID) | ORAL | Status: DC | PRN
  Filled 2012-05-20: qty 30

## 2012-05-20 MED ORDER — ACETAMINOPHEN 325 MG PO TABS
650.0000 mg | ORAL_TABLET | Freq: Four times a day (QID) | ORAL | Status: DC | PRN
  Administered 2012-05-21 – 2012-05-24 (×10): 650 mg via ORAL
  Filled 2012-05-20: qty 2
  Filled 2012-05-20: qty 1
  Filled 2012-05-20: qty 2
  Filled 2012-05-20 (×2): qty 1
  Filled 2012-05-20 (×6): qty 2

## 2012-05-20 MED ORDER — VITAMIN K1 10 MG/ML IJ SOLN
10.0000 mg | Freq: Once | INTRAVENOUS | Status: AC
  Administered 2012-05-20: 10 mg via INTRAVENOUS
  Filled 2012-05-20 (×2): qty 1

## 2012-05-20 MED ORDER — VITAMIN B-1 100 MG PO TABS
100.0000 mg | ORAL_TABLET | Freq: Every day | ORAL | Status: DC
  Administered 2012-05-21 – 2012-05-24 (×4): 100 mg via ORAL
  Filled 2012-05-20 (×4): qty 1

## 2012-05-20 MED ORDER — LORAZEPAM 2 MG/ML IJ SOLN: 0.0000 mg | Freq: Two times a day (BID) | INTRAMUSCULAR | Status: DC

## 2012-05-20 MED ORDER — LORAZEPAM 1 MG PO TABS
1.0000 mg | ORAL_TABLET | Freq: Four times a day (QID) | ORAL | Status: AC | PRN
  Administered 2012-05-21 – 2012-05-23 (×7): 1 mg via ORAL
  Filled 2012-05-20 (×7): qty 1

## 2012-05-20 MED ORDER — IOHEXOL 300 MG/ML  SOLN
100.0000 mL | Freq: Once | INTRAMUSCULAR | Status: AC | PRN
  Administered 2012-05-20: 100 mL via INTRAVENOUS

## 2012-05-20 MED ORDER — LORAZEPAM 2 MG/ML IJ SOLN: 1.0000 mg | Freq: Four times a day (QID) | INTRAMUSCULAR | Status: AC | PRN

## 2012-05-20 MED ORDER — HYDROMORPHONE HCL PF 1 MG/ML IJ SOLN: 0.5000 mg | INTRAMUSCULAR | Status: DC | PRN

## 2012-05-20 MED ORDER — HYDROMORPHONE HCL PF 1 MG/ML IJ SOLN
1.0000 mg | Freq: Once | INTRAMUSCULAR | Status: AC
  Administered 2012-05-20: 1 mg via INTRAVENOUS
  Filled 2012-05-20: qty 1

## 2012-05-20 NOTE — ED Notes (Signed)
Pt ambulated to bathroom. States stool was black flushed before able to visualize

## 2012-05-20 NOTE — ED Notes (Signed)
Pt to ct scan with nurse escort

## 2012-05-20 NOTE — ED Notes (Signed)
Dr Freida Busman informed of critical labs

## 2012-05-20 NOTE — ED Notes (Signed)
Pt homeless states fell in the woods unknown date. Today is c/o of blood in stool and urine.

## 2012-05-20 NOTE — ED Provider Notes (Signed)
History     CSN: [id number]  Arrival date & time 05/20/12  Rickey Primus   First MD Initiated Contact with Patient 05/20/12 1851      Chief Complaint  Patient presents with  . Abdominal Pain  . GI Bleeding  . Hematuria    (Consider location/radiation/quality/duration/timing/severity/associated sxs/prior treatment) Patient is a 46 y.o. male presenting with abdominal pain and hematuria. The history is provided by the patient.  Abdominal Pain The primary symptoms of the illness include abdominal pain.  Additional symptoms associated with the illness include hematuria.  Hematuria Associated symptoms include abdominal pain.   patient here complaining of blood in his stool x1 day. Describes as being bright red. He is on Coumadin and has been taking 20 mg of Coumadin a day the last 3 weeks without obtaining an INR. He is homeless and has had difficulty following up. He is on Coumadin due to a DVT of his left lower extremity. Patient denies any hematemesis. Does know he did fall recently. Notes some lower abdominal pain without syncope. Denies any headache or vomiting. Complains of bruising all over his body.  Past Medical History  Diagnosis Date  . Coronary artery disease   . Degenerative joint disease of spine   . Pulmonary embolism 06/2010  . Degenerative joint disease   . 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 ETOH abuse   . DVT (deep venous thrombosis)     Past Surgical History  Procedure Date  . Left elbow surgery   . Tonsillectomy     History reviewed. No pertinent family history.  History  Substance Use Topics  . Smoking status: Current Every Day Smoker -- 3.0 packs/day for 30 years    Types: Cigarettes  . Smokeless tobacco: Never Used  . Alcohol Use: Yes     heavy drinker daily      Review of Systems  Gastrointestinal: Positive for abdominal pain.  Genitourinary: Positive for hematuria.  All other systems reviewed and are negative.    Allergies  Review of  patient's allergies indicates no known allergies.  Home Medications   Current Outpatient Rx  Name Route Sig Dispense Refill  . ASPIRIN-CAFFEINE 845-65 MG PO PACK Oral Take 1 Package by mouth every 8 (eight) hours as needed. Pain    . IBUPROFEN 200 MG PO TABS Oral Take 600 mg by mouth every 6 (six) hours as needed. Pain    . ADULT MULTIVITAMIN W/MINERALS CH Oral Take 1 tablet by mouth daily.    . WARFARIN SODIUM 10 MG PO TABS Oral Take 2 tablets (20 mg total) by mouth daily. 60 tablet 1    SpO2 99%  Physical Exam  Nursing note and vitals reviewed. Constitutional: He is oriented to person, place, and time. He appears well-developed and well-nourished.  Non-toxic appearance. No distress.  HENT:  Head: Normocephalic and atraumatic.  Eyes: Conjunctivae normal, EOM and lids are normal. Pupils are equal, round, and reactive to light.  Neck: Normal range of motion. Neck supple. No tracheal deviation present. No mass present.  Cardiovascular: Normal rate, regular rhythm and normal heart sounds.  Exam reveals no gallop.   No murmur heard. Pulmonary/Chest: Effort normal and breath sounds normal. No stridor. No respiratory distress. He has no decreased breath sounds. He has no wheezes. He has no rhonchi. He has no rales.  Abdominal: Soft. Normal appearance and bowel sounds are normal. He exhibits no distension. There is tenderness in the left upper quadrant and left lower quadrant. There is no rigidity, no  rebound, no guarding and no CVA tenderness.    Genitourinary:       No gross blood noted per rectum  Musculoskeletal: Normal range of motion. He exhibits no edema and no tenderness.  Neurological: He is alert and oriented to person, place, and time. He has normal strength. No cranial nerve deficit or sensory deficit. GCS eye subscore is 4. GCS verbal subscore is 5. GCS motor subscore is 6.  Skin: Skin is warm and dry. No abrasion and no rash noted.       Multiple areas of ecchymosis noted as  arms and legs.  Psychiatric: He has a normal mood and affect. His speech is normal and behavior is normal.    ED Course  Procedures (including critical care time)   Labs Reviewed  CBC WITH DIFFERENTIAL  COMPREHENSIVE METABOLIC PANEL  PROTIME-INR  APTT  TYPE AND SCREEN  ETHANOL   No results found.   No diagnosis found.    MDM  Patient to be transfused with fresh frozen plasma and received IV vitamin K. Abdominal CT is pending and has been discussed with the hospitalist and patient to be admitted to step down        Toy Baker, MD 05/20/12 2103

## 2012-05-20 NOTE — ED Notes (Signed)
Pt voiding bloody urine. C/o increase in abdominal pain. Dr Freida Busman informed

## 2012-05-20 NOTE — H&P (Signed)
[facility] History and Physical  [name] VWU:[id number] DOB: [birth year] DOA: 05/20/2012  Referring physician: EDP PCP: No primary provider on file.   Chief Complaint: Bloody Urine HPI: [name] is a 46 y.o. male who presented to the Ed with complaints of dark bloody urine and ABD Pain after a fall today .  He is on coumadin for a LLE DVT, diagnosed in 04/2012. He is homeless  and has not had follow up since he was released from prison.  In the ED,  he was found to have an INR of > 10 and was taking 20 mg of Coumadin daily.  He reports drinking a fifth of vodka or alcohol daily and his last drink was 1 day ago.     Review of Systems: The patient denies anorexia, fever, weight loss, vision loss, decreased hearing, hoarseness, chest pain, syncope, dyspnea on exertion, peripheral edema, balance deficits, hemoptysis,  melena, hematochezia, severe indigestion/heartburn, hematuria, incontinence, genital sores, muscle weakness, suspicious skin lesions, transient blindness, difficulty walking, depression, unusual weight change, abnormal bleeding, enlarged lymph nodes, angioedema, and breast masses.    Past Medical History  Diagnosis Date  . Coronary artery disease   . Degenerative joint disease of spine   . Pulmonary embolism 06/2010  . Degenerative joint disease   . 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 ETOH abuse   . DVT (deep venous thrombosis)    Past Surgical History  Procedure Date  . Left elbow surgery   . Tonsillectomy    Social History:  reports that he has been smoking Cigarettes.  He has a 90 pack-year smoking history. He has never used smokeless tobacco. He reports that he drinks alcohol. He reports that he does not use illicit drugs.   No Known Allergies  History reviewed. No pertinent family history.  (be sure to complete)  Prior to Admission medications   Medication Sig Start Date End Date Taking? Authorizing Provider  warfarin (COUMADIN) 10 MG tablet Take 2  tablets (20 mg total) by mouth daily. 04/08/12 04/08/13 Yes Maryruth Bun Rama, MD   Physical Exam: Filed Vitals:   05/20/12 2001 05/20/12 2004 05/20/12 2030 05/20/12 2100  BP: 147/76 147/83 114/69 153/93  Pulse: 82 93 83 91  Resp:   15 13  SpO2:   99% 100%   GEN: Anxious and Tremulous 46 year old disheveled Caucasian male examined  and in mild distress; cooperative with exam Filed Vitals:   05/20/12 2001 05/20/12 2004 05/20/12 2030 05/20/12 2100  BP: 147/76 147/83 114/69 153/93  Pulse: 82 93 83 91  Resp:   15 13  SpO2:   99% 100%   Blood pressure 153/93, pulse 91, resp. rate 13, SpO2 100.00%. PSYCH: He is alert and oriented x4; does not appear anxious does not appear depressed; affect is normal HEENT: Normocephalic and Atraumatic, Mucous membranes pink; PERRLA; EOM intact; Fundi:  Benign;  No scleral icterus, Nares: Patent, Oropharynx: Clear, Poor Sparse Dentition, Neck:  FROM, no cervical lymphadenopathy nor thyromegaly or carotid bruit; no JVD; Breasts:: Not examined CHEST WALL: No tenderness CHEST: Normal respiration, clear to auscultation bilaterally HEART: Regular rate and rhythm; no murmurs rubs or gallops BACK: No kyphosis or scoliosis; no CVA tenderness ABDOMEN: Positive Bowel Sounds, +Early signs of Caput medusa  soft mildy tender; no masses, no organomegaly, no pannus; no intertriginous candida.  +Ecchymosis on Lateral ABD areas, and on the Left Flank Rectal Exam: Not done EXTREMITIES: + Edema of LLE, +Ulcers on Anterior Tibial areas of BOth Lower  Extermities.  No cyanosis, clubbing . Genitalia:NMG, Uncircumcised PULSES: 2+ and symmetric SKIN: Normal hydration no rash or ulceration CNS: Cranial nerves 2-12 grossly intact no focal neurologic deficit   Labs on Admission:  Basic Metabolic Panel:  Lab 05/20/12 1610  NA 134*  K 3.6  CL 95*  CO2 22  GLUCOSE 75  BUN 9  CREATININE 0.56  CALCIUM 8.7  MG --  PHOS --   Liver Function Tests:  Lab 05/20/12 1857  AST 34    ALT 18  ALKPHOS 81  BILITOT 0.3  PROT 6.8  ALBUMIN 3.3*   No results found for this basename: LIPASE:5,AMYLASE:5 in the last 168 hours No results found for this basename: AMMONIA:5 in the last 168 hours CBC:  Lab 05/20/12 1857  WBC 8.0  NEUTROABS 5.7  HGB 10.7*  HCT 31.5*  MCV 93.2  PLT 156   Cardiac Enzymes: No results found for this basename: CKTOTAL:5,CKMB:5,CKMBINDEX:5,TROPONINI:5 in the last 168 hours  BNP (last 3 results)  Basename 01/25/12 1316  PROBNP 75.9   CBG: No results found for this basename: GLUCAP:5 in the last 168 hours  Radiological Exams on Admission: No results found.  EKG: Independently reviewed.    Assessment/Plan: Present on Admission:  .Coumadin toxicity .Hematuria .Anemia .Alcohol abuse .Deep vein thrombosis .Hyponatremia .Tobacco abuse .Abdominal pain .Fall   Plan:   Admit to Stepdown Bed Administered  FFP, Vitamin K, Monitor PT/INR daily MOnitor Hb, 2 units PRBCs on hold IV Protonix CIWA Protocol IVFs CT scan of ABD and Pelvis due to ABD Pain and Fall Social Work Consult   Code Status: FULL CODE Family Communication: N/A Disposition Plan: TBA, Social Work Consult  Time spent: 60 minutes  Ron Parker [facility] Pager [phone]  If 7PM-7AM, please contact night-coverage [email or web address] Password [facility] 05/20/2012, 9:34 PM

## 2012-05-20 NOTE — ED Notes (Signed)
Pt states is unaware of when he fell. States he fell approximately 4 feet into a ditch. Pt has multiple, multiple stage bruising noted. Pt currently taking coumadin for dvt to the left leg. Came to ER today for tarry stools and blood in urine. Admits to drinking 5th of vodka every day. [place].

## 2012-05-20 NOTE — ED Notes (Signed)
See physical diagram for location of contusions

## 2012-05-21 LAB — APTT: aPTT: >200 seconds (ref 24–37)

## 2012-05-21 LAB — PREPARE FRESH FROZEN PLASMA
Unit division: 0
Unit division: 0

## 2012-05-21 LAB — HEMOGLOBIN AND HEMATOCRIT, BLOOD
HCT: 27.2 % — ABNORMAL LOW (ref 39.0–52.0)
Hemoglobin: 9.2 g/dL — ABNORMAL LOW (ref 13.0–17.0)
Hemoglobin: 9.9 g/dL — ABNORMAL LOW (ref 13.0–17.0)

## 2012-05-21 LAB — CBC
MCV: 93.5 fL (ref 78.0–100.0)
Platelets: 133 10*3/uL — ABNORMAL LOW (ref 150–400)
RBC: 2.92 MIL/uL — ABNORMAL LOW (ref 4.22–5.81)
RDW: 14.6 % (ref 11.5–15.5)
WBC: 6.1 10*3/uL (ref 4.0–10.5)

## 2012-05-21 LAB — ABO/RH: ABO/RH(D): A POS

## 2012-05-21 LAB — PROTIME-INR
INR: 1.58 — ABNORMAL HIGH (ref 0.00–1.49)
Prothrombin Time: 19.2 seconds — ABNORMAL HIGH (ref 11.6–15.2)

## 2012-05-21 LAB — BASIC METABOLIC PANEL
Calcium: 8.2 mg/dL — ABNORMAL LOW (ref 8.4–10.5)
Creatinine, Ser: 0.51 mg/dL (ref 0.50–1.35)
GFR calc Af Amer: >90 mL/min (ref >90)
GFR calc non Af Amer: >90 mL/min (ref >90)
Sodium: 135 mEq/L (ref 135–145)

## 2012-05-21 NOTE — Progress Notes (Signed)
[facility] PROGRESS NOTE  [name] RUE:[id number] DOB: Dec 27, 1965 DOA: 05/20/2012 PCP: No primary provider on file.  Assessment/Plan: Principal Problem:  *Coumadin toxicity INR now subtherapeutic. I have discussed taking Coumadin and drinking alcohol with the patient. - see below- will hold off on resuming Coumadin until hematuria resolved. Pt states that he was ordered to take 20 mg of Coumadin daily as it was difficult to get his INR to a therapeutic level- Per Dr Lodema Pilot last note he was to f/u at the Coumadin clinic- I am uncertain if his financial circumstances prevented him from following up.   Active Problems:  Alcohol abuse He is willing to undergo inpatient drug rehab if its possible- he has been referred to [facility] in the past.  Watch for withdrawal symptoms with CIWA scale    Tobacco abuse Has been counseled on this as well.    Hyponatremia Now resolved- possibly dehydration- he is not a beer drinker   Anemia Check anemia panel   Deep vein thrombosis and PEs S/p IVC filter in 06/2010-h/o recent LLE DVT-  he has been told in  the past that he will need life long Coumadin- previously has seen Dr [facility]lyn [facility]ong at [facility]     Hematuria Not yet resolved- will cont to follow.    Abdominal pain No longer present, no c/o vomiting or diarrhea.    Fall Possibly due to ETOH use? Will obtain PT eval.   Homeless Social services consulted in regards to this   Code Status: full code Family Communication: pt is not in touch with any family Disposition Plan: transfer to med-surg DVT prophylaxis: SCDs   Brief narrative: [name] is a 46 y.o. male who presented to the Ed with complaints of dark bloody urine and ABD Pain after a fall. He is on coumadin for a LLE DVT, diagnosed in 04/2012. He is homeless and has not had follow up since he was released from prison. In the ED, he was found to have an INR of > 10 and was taking 20 mg of Coumadin daily. He reports drinking  a fifth of vodka or alcohol daily.   Consultants:  none  Procedures:  none  Antibiotics:  none  HPI/Subjective: He is alert, laying in bed- admits that he is still urinating blood. Noted to have numerous bruises on exam- does not admit to falling often but states he often finds bruises on his body without any specific injury.   Objective: Filed Vitals:   05/21/12 1200 05/21/12 1300 05/21/12 1400 05/21/12 1500  BP: 123/70 122/74 114/63 105/55  Pulse: 82 86 97 86  Temp: 98.1 F (36.7 C)     TempSrc: Oral     Resp: 14 14 15 14   SpO2: 95% 95% 93% 95%    Intake/Output Summary (Last 24 hours) at 05/21/12 1639 Last data filed at 05/21/12 1200  Gross per 24 hour  Intake   1045 ml  Output    875 ml  Net    170 ml    Exam:   General:  Alert, no distress  Cardiovascular: RRR, no murmurs  Respiratory: CTA b/l   Abdomen: soft, NT, ND, BS+  Ext: no c/c/e  Data Reviewed: Basic Metabolic Panel:  Lab 05/21/12 1478 05/20/12 1857  NA 135 134*  K 3.5 3.6  CL 98 95*  CO2 26 22  GLUCOSE 76 75  BUN 7 9  CREATININE 0.51 0.56  CALCIUM 8.2* 8.7  MG -- --  PHOS -- --  Liver Function Tests:  Lab 05/20/12 1857  AST 34  ALT 18  ALKPHOS 81  BILITOT 0.3  PROT 6.8  ALBUMIN 3.3*   No results found for this basename: LIPASE:5,AMYLASE:5 in the last 168 hours No results found for this basename: AMMONIA:5 in the last 168 hours CBC:  Lab 05/21/12 1536 05/21/12 0935 05/21/12 0325 05/21/12 0323 05/20/12 2126 05/20/12 1857  WBC -- -- 6.1 -- -- 8.0  NEUTROABS -- -- -- -- -- 5.7  HGB 9.9* 9.5* 9.1* 9.2* 9.8* --  HCT 29.4* 28.4* 27.3* 27.2* 29.0* --  MCV -- -- 93.5 -- -- 93.2  PLT -- -- 133* -- -- 156   Cardiac Enzymes: No results found for this basename: CKTOTAL:5,CKMB:5,CKMBINDEX:5,TROPONINI:5 in the last 168 hours BNP (last 3 results)  Basename 01/25/12 1316  PROBNP 75.9   CBG: No results found for this basename: GLUCAP:5 in the last 168 hours  Recent Results  (from the past 240 hour(s))  MRSA PCR SCREENING     Status: Normal   Collection Time   05/20/12 11:30 PM      Component Value Range Status Comment   MRSA by PCR NEGATIVE  NEGATIVE Final      Studies: Ct Abdomen Pelvis W Contrast  05/20/2012  *RADIOLOGY REPORT*  Clinical Data: Fall, abdominal pain.  On Coumadin.  CT ABDOMEN AND PELVIS WITH CONTRAST  Technique:  Multidetector CT imaging of the abdomen and pelvis was performed following the standard protocol during bolus administration of intravenous contrast.  Contrast: OMNIPAQUE IOHEXOL 300 MG/ML  SOLN  Comparison: 04/02/2012  Findings: Linear opacity within the periphery the right lower lobe is slightly more prominent/nodular in the interval. Measures similar in maximum diameter at 6 mm.  Diffuse low attenuation of the liver in keeping with hepatic steatosis.  Unremarkable biliary system, spleen, pancreas, adrenal glands.  There is bilateral renal pelves and ureteral thickening, urothelial hyperenhancement, and periureteral fat stranding to the level of the bladder.  No bowel obstruction.  No CT evidence for colitis.  Normal appendix.  No free intraperitoneal air or fluid.  No lymphadenopathy.  Scattered atherosclerosis of the aorta and branch vessels.  IVC filter in place. Again the legs are noted to extend outside of the vessel wall.  Decreased clot burden within the left external iliac vein though there are residual prominent pelvic subcutaneous anterior collateral vessels.  Circumferential bladder wall thickening.  No deep pelvic lymphadenopathy.  No acute osseous finding.  IMPRESSION: Bilateral ureteral thickening and mucosal hyperenhancement.  This is favored to reflect an ascending infection.  Correlate with urinalysis and ureteroscopy if clinically warranted.  Small amount of right paracolic fluid is presumably reactive to the above described process.  Decreased clot burden within the left external iliac vein. Prominent collateral vessels are  noted.  IVC filter in place.  6 mm peripheral density within the right lower lobe is slightly more nodular in the interval.  This may be secondary to differences in slice selection.  Recommend a short-term (3-79-month) CT chest follow-up.   Original Report Authenticated By: Waneta Martins, M.D.     Scheduled Meds:   . sodium chloride   Intravenous STAT  . folic acid  1 mg Oral Daily  .  HYDROmorphone (DILAUDID) injection  1 mg Intravenous Once  . iohexol  20 mL Oral Q1 Hr x 2  . LORazepam  0-4 mg Intravenous Q6H   Followed by  . LORazepam  0-4 mg Intravenous Q12H  . multivitamin with minerals  1 tablet Oral Daily  . pantoprazole (PROTONIX) IV  40 mg Intravenous Q12H  . phytonadione (VITAMIN K) IV  10 mg Intravenous Once  . sodium chloride  500 mL Intravenous Once  . thiamine  100 mg Oral Daily   Or  . thiamine  100 mg Intravenous Daily   Continuous Infusions:   . DISCONTD: sodium chloride 125 mL/hr at 05/20/12 2043  . DISCONTD: sodium chloride      ________________________________________________________________________  Time spent: 35 min    [facility]  [facility] Pager [phone] If 8PM-8AM, please contact night-coverage at [email or web address], password [facility] 05/21/2012, 4:39 PM  LOS: 1 day

## 2012-05-22 DIAGNOSIS — D649 Anemia, unspecified: Secondary | ICD-10-CM

## 2012-05-22 LAB — URINALYSIS, ROUTINE W REFLEX MICROSCOPIC
Ketones, ur: 15 mg/dL — AB
Nitrite: NEGATIVE
Protein, ur: NEGATIVE mg/dL

## 2012-05-22 LAB — URINE MICROSCOPIC-ADD ON

## 2012-05-22 LAB — HEMOGLOBIN AND HEMATOCRIT, BLOOD
HCT: 28.9 % — ABNORMAL LOW (ref 39.0–52.0)
Hemoglobin: 9.7 g/dL — ABNORMAL LOW (ref 13.0–17.0)

## 2012-05-22 LAB — PROTIME-INR: INR: 1.06 (ref 0.00–1.49)

## 2012-05-22 MED ORDER — WARFARIN SODIUM 10 MG PO TABS
10.0000 mg | ORAL_TABLET | Freq: Once | ORAL | Status: AC
  Administered 2012-05-22: 10 mg via ORAL
  Filled 2012-05-22 (×2): qty 1

## 2012-05-22 MED ORDER — WARFARIN - PHARMACIST DOSING INPATIENT: Freq: Every day | Status: DC

## 2012-05-22 MED ORDER — PANTOPRAZOLE SODIUM 40 MG PO TBEC
40.0000 mg | DELAYED_RELEASE_TABLET | Freq: Two times a day (BID) | ORAL | Status: DC
  Administered 2012-05-22 – 2012-05-24 (×4): 40 mg via ORAL
  Filled 2012-05-22 (×4): qty 1

## 2012-05-22 NOTE — Progress Notes (Signed)
[facility] PROGRESS NOTE  [name] WUJ:[id number] DOB: 30-Apr-1966 DOA: 05/20/2012 PCP: No primary provider on file.  Assessment/Plan: Principal Problem:  *Coumadin toxicity Active Problems:  Alcohol abuse  Tobacco abuse  Hyponatremia  Anemia  Deep vein thrombosis  Hematuria  Abdominal pain  Fall  *Coumadin toxicity  INR now subtherapeutic. hematuria resolved- plan to restart coumadin. Pt states that he was ordered to take 20 mg of Coumadin daily as it was difficult to get his INR to a therapeutic level- Per Dr Lodema Pilot last note he was to f/u at the Coumadin clinic- I am uncertain if his financial circumstances prevented him from following up.     Alcohol abuse  He is willing to undergo inpatient drug rehab if its possible- he has been referred to [facility] in the past. Watch for withdrawal symptoms with CIWA scale   Tobacco abuse  Has been counseled on this as well.   Hyponatremia  Now resolved- possibly dehydration- he is not a beer drinker   Anemia  Check anemia panel   Deep vein thrombosis and PEs  S/p IVC filter in 06/2010-h/o recent LLE DVT- he has been told in the past that he will need life long Coumadin- previously has seen Dr [facility] at [facility].   Hematuria   resolved-  resume coumadin as hematuria resolved, check U/a   Abdominal pain  No longer present, no c/o vomiting or diarrhea.   Fall  Possibly due to ETOH use?  PT eval- patient seems to be at baseline  Homeless  Social services consulted in regards to this  Chest nodule   6 mm peripheral density within the right lower lobe is slightly more nodular in the interval.  This may be secondary to differences in slice selection.  Recommend a short-term (3-58-month) CT chest follow-up.       Code Status: full Family Communication: patient at bedsid Disposition Plan: ?- await social services   Brief narrative:  [name] is a 46 y.o. male who presented to the Ed with complaints of dark  bloody urine and ABD Pain after a fall. He is on coumadin for a LLE DVT, diagnosed in 04/2012. He is homeless and has not had follow up since he was released from prison. In the ED, he was found to have an INR of > 10 and was taking 20 mg of Coumadin daily. He reports drinking a fifth of vodka or alcohol daily.   Consultants:  None   Procedures:  None   Antibiotics:  None   HPI/Subjective: Just out shower Hematuria resolved   Objective: Filed Vitals:   05/21/12 1835 05/21/12 2254 05/22/12 0536 05/22/12 1017  BP: 102/59 109/58 125/80 108/62  Pulse: 102 89 89 96  Temp: 98.1 F (36.7 C) 98.6 F (37 C) 98.2 F (36.8 C) 98.4 F (36.9 C)  TempSrc: Oral   Oral  Resp: 16 18 18 18   Height: 6\' 2"  (1.88 m)     Weight: 80.8 kg (178 lb 2.1 oz)     SpO2: 99% 98% 100% 100%    Intake/Output Summary (Last 24 hours) at 05/22/12 1028 Last data filed at 05/22/12 0235  Gross per 24 hour  Intake     85 ml  Output   1950 ml  Net  -1865 ml   Filed Weights   05/21/12 1835  Weight: 80.8 kg (178 lb 2.1 oz)    Exam:   General:  A+Ox3, NAD  Cardiovascular: rrr  Respiratory: clear anterior  Abdomen: +BS, soft, NT/ND  Data Reviewed: Basic Metabolic Panel:  Lab 05/21/12 1610 05/20/12 1857  NA 135 134*  K 3.5 3.6  CL 98 95*  CO2 26 22  GLUCOSE 76 75  BUN 7 9  CREATININE 0.51 0.56  CALCIUM 8.2* 8.7  MG -- --  PHOS -- --   Liver Function Tests:  Lab 05/20/12 1857  AST 34  ALT 18  ALKPHOS 81  BILITOT 0.3  PROT 6.8  ALBUMIN 3.3*   No results found for this basename: LIPASE:5,AMYLASE:5 in the last 168 hours No results found for this basename: AMMONIA:5 in the last 168 hours CBC:  Lab 05/22/12 0355 05/21/12 2053 05/21/12 1536 05/21/12 0935 05/21/12 0325 05/20/12 1857  WBC -- -- -- -- 6.1 8.0  NEUTROABS -- -- -- -- -- 5.7  HGB 9.7* 10.4* 9.9* 9.5* 9.1* --  HCT 28.9* 30.8* 29.4* 28.4* 27.3* --  MCV -- -- -- -- 93.5 93.2  PLT -- -- -- -- 133* 156   Cardiac  Enzymes: No results found for this basename: CKTOTAL:5,CKMB:5,CKMBINDEX:5,TROPONINI:5 in the last 168 hours BNP (last 3 results)  Basename 01/25/12 1316  PROBNP 75.9   CBG: No results found for this basename: GLUCAP:5 in the last 168 hours  Recent Results (from the past 240 hour(s))  MRSA PCR SCREENING     Status: Normal   Collection Time   05/20/12 11:30 PM      Component Value Range Status Comment   MRSA by PCR NEGATIVE  NEGATIVE Final      Studies: Ct Abdomen Pelvis W Contrast  05/20/2012  *RADIOLOGY REPORT*  Clinical Data: Fall, abdominal pain.  On Coumadin.  CT ABDOMEN AND PELVIS WITH CONTRAST  Technique:  Multidetector CT imaging of the abdomen and pelvis was performed following the standard protocol during bolus administration of intravenous contrast.  Contrast: OMNIPAQUE IOHEXOL 300 MG/ML  SOLN  Comparison: 04/02/2012  Findings: Linear opacity within the periphery the right lower lobe is slightly more prominent/nodular in the interval. Measures similar in maximum diameter at 6 mm.  Diffuse low attenuation of the liver in keeping with hepatic steatosis.  Unremarkable biliary system, spleen, pancreas, adrenal glands.  There is bilateral renal pelves and ureteral thickening, urothelial hyperenhancement, and periureteral fat stranding to the level of the bladder.  No bowel obstruction.  No CT evidence for colitis.  Normal appendix.  No free intraperitoneal air or fluid.  No lymphadenopathy.  Scattered atherosclerosis of the aorta and branch vessels.  IVC filter in place. Again the legs are noted to extend outside of the vessel wall.  Decreased clot burden within the left external iliac vein though there are residual prominent pelvic subcutaneous anterior collateral vessels.  Circumferential bladder wall thickening.  No deep pelvic lymphadenopathy.  No acute osseous finding.  IMPRESSION: Bilateral ureteral thickening and mucosal hyperenhancement.  This is favored to reflect an ascending  infection.  Correlate with urinalysis and ureteroscopy if clinically warranted.  Small amount of right paracolic fluid is presumably reactive to the above described process.  Decreased clot burden within the left external iliac vein. Prominent collateral vessels are noted.  IVC filter in place.  6 mm peripheral density within the right lower lobe is slightly more nodular in the interval.  This may be secondary to differences in slice selection.  Recommend a short-term (3-88-month) CT chest follow-up.   Original Report Authenticated By: Waneta Martins, M.D.     Scheduled Meds:   . sodium chloride   Intravenous  STAT  . folic acid  1 mg Oral Daily  . LORazepam  0-4 mg Intravenous Q6H   Followed by  . LORazepam  0-4 mg Intravenous Q12H  . multivitamin with minerals  1 tablet Oral Daily  . pantoprazole (PROTONIX) IV  40 mg Intravenous Q12H  . sodium chloride  500 mL Intravenous Once  . thiamine  100 mg Oral Daily   Or  . thiamine  100 mg Intravenous Daily   Continuous Infusions:   Principal Problem:  *Coumadin toxicity Active Problems:  Alcohol abuse  Tobacco abuse  Hyponatremia  Anemia  Deep vein thrombosis  Hematuria  Abdominal pain  Fall    Time spent: 25 min    Marlin Canary  [facility] Pager [phone] 05/22/2012, 10:28 AM  LOS: 2 days

## 2012-05-22 NOTE — Progress Notes (Signed)
ANTICOAGULATION CONSULT NOTE - Initial Consult  Pharmacy Consult for Coumadin Indication: DVT  No Known Allergies  Patient Measurements: Height: 6\' 2"  (188 cm) Weight: 178 lb 2.1 oz (80.8 kg) IBW/kg (Calculated) : 82.2    Vital Signs: Temp: 98.4 F (36.9 C) (09/15 1017) Temp src: Oral (09/15 1017) BP: 108/62 mmHg (09/15 1017) Pulse Rate: 96  (09/15 1017)  Labs:  Basename 05/22/12 0355 05/21/12 2053 05/21/12 1536 05/21/12 0325 05/20/12 1857  HGB 9.7* 10.4* -- -- --  HCT 28.9* 30.8* 29.4* -- --  PLT -- -- -- 133* 156  APTT -- -- -- -- >200*  LABPROT 14.0 -- -- 19.2* >90.0*  INR 1.06 -- -- 1.58* >10.00*  HEPARINUNFRC -- -- -- -- --  CREATININE -- -- -- 0.51 0.56  CKTOTAL -- -- -- -- --  CKMB -- -- -- -- --  TROPONINI -- -- -- -- --    Estimated Creatinine Clearance: 130.5 ml/min (by C-G formula based on Cr of 0.51).   Medical History: Past Medical History  Diagnosis Date  . Coronary artery disease   . Degenerative joint disease of spine   . Pulmonary embolism 06/2010  . Degenerative joint disease   . 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 ETOH abuse   . DVT (deep venous thrombosis)     Medications:  Prescriptions prior to admission  Medication Sig Dispense Refill  . warfarin (COUMADIN) 10 MG tablet Take 2 tablets (20 mg total) by mouth daily.  60 tablet  1    Assessment: 46 yo man admitted with supratherapeutic INR and hematuria on coumadin for h/o DVT.  He also has a h/o EtOh abuse and is willing to undergo drug rehab.  He reportedly was on 20 mg coumadin daily PTA.  Hematuria has resolved and INR back to baseline 1.06.  He received 10 mg Vit K IV on 9/13. Goal of Therapy:  INR 2-3 Monitor platelets by anticoagulation protocol: Yes   Plan:  Coumadin 10 mg po today. Check daily PT/INR. F/u s&s bleeding.  Talbert Cage Poteet 05/22/2012,11:42 AM

## 2012-05-22 NOTE — Evaluation (Signed)
Physical Therapy Evaluation Patient Details Name: [name] MRN: [id number] DOB: [birth year] Today's Date: 05/22/2012 Time: 7829-5621 PT Time Calculation (min): 17 min  PT Assessment / Plan / Recommendation Clinical Impression  [name] is 46 y/o male admitted with hematuria and coumadin toxicity with INR >10. Per chart and pt report [name] drinks at least a 5th of vodka daily.  Secondary to a history of falls PT was asked to consult. [name] presents to PT today likely close to his functional baseline but with impaired balance and gait limiting his safety. Recommend single point cane for improved stability with gait as well as outpatient PT. Will follow pt acutely to maximize safety with gait using AD.     PT Assessment  Patient needs continued PT services    Follow Up Recommendations  Outpatient PT    Barriers to Discharge Decreased caregiver support;Inaccessible home environment Patient is homeless with limited financial resources    Nurse, learning disability    Recommendations for Other Services     Frequency Min 3X/week    Precautions / Restrictions Precautions Precautions: Fall         Mobility  Bed Mobility Bed Mobility: Supine to Sit;Sit to Supine Supine to Sit: 7: Independent Sit to Supine: 7: Independent Transfers Transfers: Sit to Stand;Stand to Sit Sit to Stand: 6: Modified independent (Device/Increase time) Stand to Sit: 6: Modified independent (Device/Increase time) Ambulation/Gait Ambulation/Gait Assistance: 5: Supervision Ambulation Distance (Feet): 150 Feet Assistive device: None Ambulation/Gait Assistance Details: supervision secondary to slight antalgia/stagger with gait but pt able to self correct Gait Pattern: Step-through pattern;Antalgic Gait velocity: slowed General Gait Details: slight antalgia    Exercises     PT Diagnosis: Difficulty walking;Abnormality of gait;Acute pain  PT Problem List: Decreased activity tolerance;Decreased  balance;Decreased mobility;Pain;Decreased knowledge of use of DME PT Treatment Interventions: DME instruction;Gait training;Functional mobility training;Therapeutic activities;Therapeutic exercise;Patient/family education;Neuromuscular re-education   PT Goals Acute Rehab PT Goals PT Goal Formulation: With patient Time For Goal Achievement: 05/29/12 Potential to Achieve Goals: Good Pt will Ambulate: >150 feet;with modified independence;with least restrictive assistive device PT Goal: Ambulate - Progress: Goal set today  Visit Information  Last PT Received On: 05/22/12 Assistance Needed: +1    Subjective Data  Subjective: I came in because I was peeing blood.    Prior Functioning  Home Living Lives With: Alone Available Help at Discharge:  (none, patient is homeless) Type of Home: Homeless Additional Comments: pt reports he lost his cane and his glasses and that is when he started falling, he also admits to alcohol use and falling when he is drunk Prior Function Level of Independence: Independent Driving: No Vocation: Unemployed Communication Communication: No difficulties    Cognition  Overall Cognitive Status: Appears within functional limits for tasks assessed/performed Arousal/Alertness: Awake/alert Orientation Level: Appears intact for tasks assessed Behavior During Session: [facility] for tasks performed    Extremity/Trunk Assessment Right Upper Extremity Assessment RUE ROM/Strength/Tone: Within functional levels Left Upper Extremity Assessment LUE ROM/Strength/Tone: Within functional levels Right Lower Extremity Assessment RLE ROM/Strength/Tone: Within functional levels Left Lower Extremity Assessment LLE ROM/Strength/Tone: Within functional levels   Balance Static Standing Balance Static Standing - Balance Support: No upper extremity supported Static Standing - Level of Assistance: 5: Stand by assistance Static Standing - Comment/# of Minutes: during ambulation in the  hallway the pt was able to stand on right leg to fix his sock and point to pain in his left leg with little to no difficulty Single Leg Stance -  Right Leg: 10   End of Session PT - End of Session Equipment Utilized During Treatment: Gait belt Activity Tolerance: Patient tolerated treatment well Patient left: in bed;with call bell/phone within reach Nurse Communication: Mobility status  GP Functional Limitation: Mobility: Walking and moving around Mobility: Walking and Moving Around Current Status (A5409): At least 20 percent but less than 40 percent impaired, limited or restricted Mobility: Walking and Moving Around Goal Status [phone]): 0 percent impaired, limited or restricted   [facility] HELEN 05/22/2012, 9:48 AM

## 2012-05-23 LAB — PROTIME-INR: Prothrombin Time: 12.5 seconds (ref 11.6–15.2)

## 2012-05-23 MED ORDER — TETANUS-DIPHTHERIA TOXOIDS TD 5-2 LFU IM INJ
0.5000 mL | INJECTION | Freq: Once | INTRAMUSCULAR | Status: AC
  Administered 2012-05-23: 0.5 mL via INTRAMUSCULAR
  Filled 2012-05-23 (×2): qty 0.5

## 2012-05-23 MED ORDER — BACITRACIN-NEOMYCIN-POLYMYXIN 400-5-5000 EX OINT
TOPICAL_OINTMENT | Freq: Two times a day (BID) | CUTANEOUS | Status: DC
  Administered 2012-05-23: 22:00:00 via TOPICAL
  Administered 2012-05-23 – 2012-05-24 (×2): 1 via TOPICAL
  Filled 2012-05-23 (×3): qty 1

## 2012-05-23 MED ORDER — BACITRACIN-NEOMYCIN-POLYMYXIN 400-5-5000 EX OINT
TOPICAL_OINTMENT | CUTANEOUS | Status: AC
  Administered 2012-05-23: 1 via TOPICAL
  Filled 2012-05-23: qty 1

## 2012-05-23 NOTE — Progress Notes (Signed)
[facility] PROGRESS NOTE  [name] RUE:[id number] DOB: 1966-04-09 DOA: 05/20/2012 PCP: No primary provider on file.  Assessment/Plan: Principal Problem:  *Coumadin toxicity Active Problems:  Alcohol abuse  Tobacco abuse  Hyponatremia  Anemia  Deep vein thrombosis  Hematuria  Abdominal pain  Fall  *Coumadin toxicity  INR now subtherapeutic. hematuria resolved- plan to restart coumadin. Pt states that he was ordered to take 20 mg of Coumadin daily as it was difficult to get his INR to a therapeutic level- Per Dr Lodema Pilot last note he was to f/u at the Coumadin clinic- restarted coumadin   Alcohol abuse  He is willing to undergo inpatient drug rehab if its possible- he has been referred to [facility] in the past. Watch for withdrawal symptoms with CIWA scale   Tobacco abuse  Has been counseled on this as well.   Hyponatremia  Now resolved- possibly dehydration- he is not a beer drinker   Anemia  Check anemia panel   Deep vein thrombosis and PEs  S/p IVC filter in 06/2010-h/o recent LLE DVT- he has been told in the past that he will need life long Coumadin- previously had seen Dr Gaylyn Rong at [facility].   Hematuria   resolved-  resume coumadin as hematuria resolved, check U/a   Abdominal pain  No longer present, no c/o vomiting or diarrhea.   Fall  Possibly due to ETOH use?  PT eval- patient seems to be at baseline  Homeless  Social services consulted in regards to this- hope to find a rehab facility  Chest nodule   6 mm peripheral density within the right lower lobe is slightly more nodular in the interval.  This may be secondary to differences in slice selection.  Recommend a short-term (3-80-month) CT chest follow-up.       Code Status: full Family Communication: patient at bedsid Disposition Plan: ?- await social services   Brief narrative:  [name] is a 46 y.o. male who presented to the Ed with complaints of dark bloody urine and ABD Pain after a  fall. He is on coumadin for a LLE DVT, diagnosed in 04/2012. He is homeless and has not had follow up since he was released from prison. In the ED, he was found to have an INR of > 10 and was taking 20 mg of Coumadin daily. He reports drinking a fifth of vodka or alcohol daily.   Consultants:  None   Procedures:  None   Antibiotics:  None   HPI/Subjective: No more hematuria, no new c/o   Objective: Filed Vitals:   05/22/12 1017 05/22/12 1441 05/22/12 2158 05/23/12 0526  BP: 108/62 137/74 128/78 111/62  Pulse: 96 97 81 79  Temp: 98.4 F (36.9 C) 98.5 F (36.9 C) 98.2 F (36.8 C) 98 F (36.7 C)  TempSrc: Oral Oral Oral Oral  Resp: 18 18 16 19   Height:      Weight:      SpO2: 100% 100% 98% 98%    Intake/Output Summary (Last 24 hours) at 05/23/12 0901 Last data filed at 05/23/12 0500  Gross per 24 hour  Intake      0 ml  Output    650 ml  Net   -650 ml   Filed Weights   05/21/12 1835  Weight: 80.8 kg (178 lb 2.1 oz)    Exam:   General:  A+Ox3, NAD  Cardiovascular: rrr  Respiratory: clear anterior  Abdomen: +BS, soft, NT/ND  Skin: multiple areas of bruising  Data Reviewed: Basic Metabolic Panel:  Lab 05/21/12 6578 05/20/12 1857  NA 135 134*  K 3.5 3.6  CL 98 95*  CO2 26 22  GLUCOSE 76 75  BUN 7 9  CREATININE 0.51 0.56  CALCIUM 8.2* 8.7  MG -- --  PHOS -- --   Liver Function Tests:  Lab 05/20/12 1857  AST 34  ALT 18  ALKPHOS 81  BILITOT 0.3  PROT 6.8  ALBUMIN 3.3*   No results found for this basename: LIPASE:5,AMYLASE:5 in the last 168 hours No results found for this basename: AMMONIA:5 in the last 168 hours CBC:  Lab 05/22/12 0355 05/21/12 2053 05/21/12 1536 05/21/12 0935 05/21/12 0325 05/20/12 1857  WBC -- -- -- -- 6.1 8.0  NEUTROABS -- -- -- -- -- 5.7  HGB 9.7* 10.4* 9.9* 9.5* 9.1* --  HCT 28.9* 30.8* 29.4* 28.4* 27.3* --  MCV -- -- -- -- 93.5 93.2  PLT -- -- -- -- 133* 156   Cardiac Enzymes: No results found for this  basename: CKTOTAL:5,CKMB:5,CKMBINDEX:5,TROPONINI:5 in the last 168 hours BNP (last 3 results)  Basename 01/25/12 1316  PROBNP 75.9   CBG: No results found for this basename: GLUCAP:5 in the last 168 hours  Recent Results (from the past 240 hour(s))  MRSA PCR SCREENING     Status: Normal   Collection Time   05/20/12 11:30 PM      Component Value Range Status Comment   MRSA by PCR NEGATIVE  NEGATIVE Final      Studies: No results found.  Scheduled Meds:    . folic acid  1 mg Oral Daily  . LORazepam  0-4 mg Intravenous Q6H   Followed by  . LORazepam  0-4 mg Intravenous Q12H  . multivitamin with minerals  1 tablet Oral Daily  . pantoprazole  40 mg Oral BID AC  . sodium chloride  500 mL Intravenous Once  . thiamine  100 mg Oral Daily   Or  . thiamine  100 mg Intravenous Daily  . warfarin  10 mg Oral ONCE-1800  . Warfarin - Pharmacist Dosing Inpatient   Does not apply q1800  . DISCONTD: pantoprazole (PROTONIX) IV  40 mg Intravenous Q12H   Continuous Infusions:   Principal Problem:  *Coumadin toxicity Active Problems:  Alcohol abuse  Tobacco abuse  Hyponatremia  Anemia  Deep vein thrombosis  Hematuria  Abdominal pain  Fall    Time spent: 25 min    Marlin Canary  [facility] Pager [phone] 05/23/2012, 9:01 AM  LOS: 3 days

## 2012-05-23 NOTE — Progress Notes (Signed)
Physical Therapy Treatment Patient Details Name: [name] MRN: [id number] DOB: October 25, 1965 Today's Date: 05/23/2012 Time: 7829-5621 PT Time Calculation (min): 23 min  PT Assessment / Plan / Recommendation Comments on Treatment Session  Pt admitted with hematuria along with coumadin toxicity.  Pt motivated and progressing well with therapy.  Able to use single point cane this am with increased activity tolerance.      Follow Up Recommendations  Outpatient PT    Barriers to Discharge        Equipment Recommendations  Cane    Recommendations for Other Services    Frequency Min 3X/week   Plan Discharge plan remains appropriate;Frequency remains appropriate    Precautions / Restrictions Precautions Precautions: Fall Restrictions Weight Bearing Restrictions: No   Pertinent Vitals/Pain None.  Pt reports premedication.    Mobility  Bed Mobility Bed Mobility: Supine to Sit;Sit to Supine Supine to Sit: 7: Independent Sit to Supine: 7: Independent Transfers Transfers: Sit to Stand;Stand to Sit Sit to Stand: 7: Independent;From bed Stand to Sit: 7: Independent;To bed Ambulation/Gait Ambulation/Gait Assistance: 5: Supervision Ambulation Distance (Feet): 200 Feet Assistive device: Straight cane Ambulation/Gait Assistance Details: Verbal cues for tall posture and safe use/proper sequence with single point cane.   Gait Pattern: Step-through pattern;Antalgic Stairs: No Wheelchair Mobility Wheelchair Mobility: No    Exercises     PT Diagnosis:    PT Problem List:   PT Treatment Interventions:     PT Goals Acute Rehab PT Goals PT Goal Formulation: With patient Time For Goal Achievement: 05/29/12 Potential to Achieve Goals: Good PT Goal: Ambulate - Progress: Progressing toward goal  Visit Information  Last PT Received On: 05/23/12 Assistance Needed: +1    Subjective Data  Subjective: "My leg starts to hurt more the more that I do." Patient Stated Goal: Get stronger     Cognition  Overall Cognitive Status: Appears within functional limits for tasks assessed/performed Arousal/Alertness: Awake/alert Orientation Level: Appears intact for tasks assessed Behavior During Session: [facility] for tasks performed    Balance  Balance Balance Assessed: No  End of Session PT - End of Session Equipment Utilized During Treatment: Gait belt Activity Tolerance: Patient tolerated treatment well Patient left: in bed;with call bell/phone within reach Nurse Communication: Mobility status   GP     Cephus Shelling 05/23/2012, 12:26 PM  05/23/2012 Cephus Shelling, PT, DPT [phone]

## 2012-05-23 NOTE — Progress Notes (Signed)
Clinical Social Work Department BRIEF PSYCHOSOCIAL ASSESSMENT 05/23/2012  Patient:  [name],[name]     Account Number:  [id number]     Admit date:  05/20/2012  Clinical Social Worker:  Dennison Bulla  Date/Time:  05/23/2012 02:15 PM  Referred by:  Physician  Date Referred:  05/23/2012 Referred for  Substance Abuse  Homelessness   Other Referral:   Interview type:  Patient Other interview type:    PSYCHOSOCIAL DATA Living Status:   Admitted from facility:   Level of care:   Primary support name:  Lillia Abed Primary support relationship to patient:  FRIEND Degree of support available:   Lacking    CURRENT CONCERNS Current Concerns  Substance Abuse   Other Concerns:    SOCIAL WORK ASSESSMENT / PLAN CSW received referral due to patient being homeless and needing substance abuse counseling. CSW reviewed chart and met with patient at bedside. No visitors present.    CSW introduced myself and explained role. Patient reports that he has been homeless for about 6-7 years. Patient has lived all over the country but before he moved to [place] he lived in [place]. Patient reported he moved because he needed a change but was never able to secure housing in [place]. [place] others. Patient reports that he had been doing well in the woods but fell several times and was bleeding. Patient is compliant with his Coumadin and reports he takes his medication as prescribed on a daily basis. Patient is aware that he should not be using alcohol while on Coumadin.  Patient reports he has been drinking since he was about 1 and started drinking on a daily basis around 46 years old. Patient drinks about a fifth of liquor a day. Patient denies any other substance use. Patient reports he wants residential treatment and is open to any placement.    CSW called [facility] who has a $1200 cost for their residential treatment. CSW left a message to determine if any grants  were available. CSW made referral to [facility] program and left a message regarding if referral was received. CSW called [facility] who reported no beds were available. CSW called [facility] to determine if any beds were available but had to leave a message. [facility] was explored with patient but patient has no income and therefore could not pay rent. CSW will continue to follow.   Assessment/plan status:  Psychosocial Support/Ongoing Assessment of Needs Other assessment/ plan:   SBIRT   Information/referral to community resources:   Referrals for residential treatment    PATIENT'S/FAMILY'S RESPONSE TO PLAN OF CARE: Patient was alert and oriented. Patient was engaged throughout assessment. Patient open to treatment.

## 2012-05-23 NOTE — Progress Notes (Signed)
Rn found patient in the bathroom standing up and shaving his neck. Rn informed patient that he is at risk for bleeding and shaving at this time was not a good idea. He said that he wasn't aware of that and wouldn't shave anymore. Rn will continue to monitor.

## 2012-05-23 NOTE — Progress Notes (Signed)
ANTICOAGULATION CONSULT NOTE - Follow Up Pharmacy Consult for Coumadin Indication: DVT  No Known Allergies  Patient Measurements: Height: 6\' 2"  (188 cm) Weight: 178 lb 2.1 oz (80.8 kg) IBW/kg (Calculated) : 82.2    Vital Signs: Temp: 97.8 F (36.6 C) (09/16 0900) Temp src: Oral (09/16 0900) BP: 100/51 mmHg (09/16 0900) Pulse Rate: 90  (09/16 0900)  Labs:  Basename 05/23/12 0522 05/22/12 0355 05/21/12 2053 05/21/12 1536 05/21/12 0325 05/20/12 1857  HGB -- 9.7* 10.4* -- -- --  HCT -- 28.9* 30.8* 29.4* -- --  PLT -- -- -- -- 133* 156  APTT -- -- -- -- -- >200*  LABPROT 12.5 14.0 -- -- 19.2* --  INR 0.91 1.06 -- -- 1.58* --  HEPARINUNFRC -- -- -- -- -- --  CREATININE -- -- -- -- 0.51 0.56  CKTOTAL -- -- -- -- -- --  CKMB -- -- -- -- -- --  TROPONINI -- -- -- -- -- --    Estimated Creatinine Clearance: 130.5 ml/min (by C-G formula based on Cr of 0.51).   Medical History: Past Medical History  Diagnosis Date  . Coronary artery disease   . Degenerative joint disease of spine   . Pulmonary embolism 06/2010  . Degenerative joint disease   . 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 ETOH abuse   . DVT (deep venous thrombosis)     Medications:  Prescriptions prior to admission  Medication Sig Dispense Refill  . warfarin (COUMADIN) 10 MG tablet Take 2 tablets (20 mg total) by mouth daily.  60 tablet  1    Assessment: 46 yo man admitted with supratherapeutic INR and hematuria on coumadin for h/o DVT.  He also has a h/o EtOh abuse and is willing to undergo drug rehab.  He reportedly was on 20 mg coumadin daily PTA.  Hematuria has resolved and INR back to baseline.  He received 10 mg Vit K IV on 9/13. Resume coumadin 9/15 Goal of Therapy:  INR 2-3 Monitor platelets by anticoagulation protocol: Yes   Plan:  Coumadin 10 mg po today. Check daily PT/INR. F/u s&s bleeding.  Arleatha Philipps [place] 05/23/2012,10:49 AM

## 2012-05-24 DIAGNOSIS — T50904A Poisoning by unspecified drugs, medicaments and biological substances, undetermined, initial encounter: Secondary | ICD-10-CM

## 2012-05-24 DIAGNOSIS — T45511A Poisoning by anticoagulants, accidental (unintentional), initial encounter: Secondary | ICD-10-CM

## 2012-05-24 DIAGNOSIS — R109 Unspecified abdominal pain: Secondary | ICD-10-CM

## 2012-05-24 DIAGNOSIS — F1010 Alcohol abuse, uncomplicated: Secondary | ICD-10-CM

## 2012-05-24 DIAGNOSIS — T50901A Poisoning by unspecified drugs, medicaments and biological substances, accidental (unintentional), initial encounter: Secondary | ICD-10-CM

## 2012-05-24 DIAGNOSIS — R791 Abnormal coagulation profile: Principal | ICD-10-CM

## 2012-05-24 LAB — PROTIME-INR: Prothrombin Time: 12 seconds (ref 11.6–15.2)

## 2012-05-24 MED ORDER — FOLIC ACID 1 MG PO TABS: 1.0000 mg | ORAL_TABLET | Freq: Every day | ORAL | Status: DC

## 2012-05-24 MED ORDER — THIAMINE HCL 100 MG PO TABS: 100.0000 mg | ORAL_TABLET | Freq: Every day | ORAL | Status: DC

## 2012-05-24 MED ORDER — ACETAMINOPHEN 325 MG PO TABS: 650.0000 mg | ORAL_TABLET | Freq: Four times a day (QID) | ORAL | Status: DC | PRN

## 2012-05-24 MED ORDER — WARFARIN SODIUM 7.5 MG PO TABS
15.0000 mg | ORAL_TABLET | Freq: Once | ORAL | Status: DC
  Filled 2012-05-24: qty 2

## 2012-05-24 MED ORDER — WARFARIN SODIUM 10 MG PO TABS: 15.0000 mg | ORAL_TABLET | Freq: Every day | ORAL | Status: DC

## 2012-05-24 NOTE — Progress Notes (Signed)
ANTICOAGULATION CONSULT NOTE - Follow Up Pharmacy Consult for Coumadin Indication: DVT  No Known Allergies  Patient Measurements: Height: 6\' 2"  (188 cm) Weight: 178 lb 2.1 oz (80.8 kg) IBW/kg (Calculated) : 82.2    Vital Signs: Temp: 98 F (36.7 C) (09/17 0533) Temp src: Oral (09/17 0533) BP: 114/69 mmHg (09/17 0533) Pulse Rate: 70  (09/17 0533)  Labs:  Alvira Philips 05/24/12 0835 05/23/12 0522 05/22/12 0355 05/21/12 2053 05/21/12 1536  HGB -- -- 9.7* 10.4* --  HCT -- -- 28.9* 30.8* 29.4*  PLT -- -- -- -- --  APTT -- -- -- -- --  LABPROT 12.0 12.5 14.0 -- --  INR 0.87 0.91 1.06 -- --  HEPARINUNFRC -- -- -- -- --  CREATININE -- -- -- -- --  CKTOTAL -- -- -- -- --  CKMB -- -- -- -- --  TROPONINI -- -- -- -- --    Estimated Creatinine Clearance: 130.5 ml/min (by C-G formula based on Cr of 0.51).   Medical History: Past Medical History  Diagnosis Date  . Coronary artery disease   . Degenerative joint disease of spine   . Pulmonary embolism 06/2010  . Degenerative joint disease   . 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 ETOH abuse   . DVT (deep venous thrombosis)     Medications:  Prescriptions prior to admission  Medication Sig Dispense Refill  . warfarin (COUMADIN) 10 MG tablet Take 2 tablets (20 mg total) by mouth daily.  60 tablet  1    Assessment: 46 yo man admitted with supratherapeutic INR and hematuria on coumadin for h/o DVT.  He also has a h/o EtOh abuse. He reportedly was on 20 mg coumadin daily PTA.  Hematuria has resolved and INR is back to baseline s/p Vit K reversal (received 10 mg Vit K IV on 9/13). INR remains subtherapeutic following first Coumadin dose 9/15- would anticipate slow increase given recent Vit K reversal.  Noted patient has a pretty significant EtOh history- binge drinking can increase INR by reducing Coumadin metabolism and chronic EtOh intake can decrease INR by increasing Coumadin metabolism. Given his social status, I doubt he  would be a good candidate for Xarelto.  Goal of Therapy:  INR 2-3 Monitor platelets by anticoagulation protocol: Yes   Plan:  - Coumadin 15mg  PO x 1 today - Will continue to f/up daily INR - Consider Lovenox 120mg  SQ daily until INR is >2  Thanks, Zulay Corrie K. Allena Katz, PharmD, BCPS.  Clinical Pharmacist Pager [phone]. 05/24/2012 7:50 AM

## 2012-05-24 NOTE — Progress Notes (Signed)
Discussed case and collaborating with unit CSW [place] regarding patient. Have started referral for [facility] as all other options have been exhausted. Patient to dc to [facility] and follow up with [facility] along with [facility] (in DC instructions) Patient to be given bus passes to follow up with [facility] in which [place] will facilitate.  Referral and release of information has been obtained to send [facility] patient information for continuity of care and assisting patient with transportation to [facility] when bed becomes available.  Per [facility] admissions, may have a bed by the end of the week. Will complete referral and fax to [facility] for continue help and care.  Patient to dc today per CSW and medical team. No other needs signing off.  Ashley Jacobs, MSW LCSW [phone]

## 2012-05-24 NOTE — Discharge Planning (Signed)
Pt. Discharged in stable condition. Verbalizes understanding of all discharge instructions, including home medications and follow up appointments.

## 2012-05-24 NOTE — Discharge Summary (Signed)
Physician Discharge Summary  [name] UXL:[id number] DOB: 05-08-1966 DOA: 05/20/2012  PCP: No primary provider on file.  Admit date: 05/20/2012 Discharge date: 05/24/2012  Recommendations for Outpatient Follow-up:  1. PT/INR 2. Lung nodule f/u CT in 3-6 months   Discharge Diagnoses:  Principal Problem:  *Coumadin toxicity Active Problems:  Alcohol abuse  Tobacco abuse  Hyponatremia  Anemia  Deep vein thrombosis  Hematuria  Abdominal pain  Fall Lung nodule   Discharge Condition: good  Diet recommendation: regular  Filed Weights   05/21/12 1835  Weight: 80.8 kg (178 lb 2.1 oz)    History of present illness:  [name] is a 46 y.o. male who presented to the Ed with complaints of dark bloody urine and ABD Pain after a fall  . He is on coumadin for a LLE DVT, diagnosed in 04/2012. He is homeless and has not had follow up since he was released from prison. In the ED, he was found to have an INR of > 10 and was taking 20 mg of Coumadin daily. He reports drinking a fifth of vodka or alcohol daily and his last drink was 1 day ago.      Hospital Course:  Coumadin toxicity  Patient presented with hematuria and an INR>10.Coumadin was held and hematuria resolved-  Per Dr [name] last note he was to f/u at the Coumadin clinic. Resarted coumadin prior to discharge  Alcohol abuse  He is willing to undergo inpatient drug rehab if its possible- he has been referred to [facility] at discharge  Tobacco abuse  Has been counseled on this as well.  Hyponatremia  Now resolved- possibly dehydration- he is not a beer drinker  Anemia  Check anemia panel  Deep vein thrombosis and PEs  S/p IVC filter in 06/2010-h/o recent LLE DVT- he has been told in the past that he will need life long Coumadin- previously had seen Dr [name] at [facility].  Hematuria  resolved- resume coumadin as hematuria resolved, check U/a  Abdominal pain  No longer present, no c/o vomiting or diarrhea.  Fall  Possibly due  to ETOH use? PT eval- patient seems to be at baseline  Chest nodule  6 mm peripheral density within the right lower lobe is slightly more nodular in the interval. This may be secondary to differences in slice selection. Recommend a short-term (3-23-month) CT chest follow-up.      Procedures: none Consultations: none Discharge Exam: Filed Vitals:   05/23/12 2316 05/24/12 0533 05/24/12 0946 05/24/12 1008  BP: 111/70 114/69 105/54 105/61  Pulse: 77 70 73 63  Temp: 97.8 F (36.6 C) 98 F (36.7 C)  98.2 F (36.8 C)  TempSrc: Oral Oral  Oral  Resp: 20 18  18   Height:      Weight:      SpO2: 98% 98%  99%    General: axox3 Cardiovascular: rrr Respiratory: ctab  Discharge Instructions  Discharge Orders    Future Appointments: Provider: Department: Dept Phone: Center:   06/16/2012 1:30 PM [name] [facility] [phone] None   06/16/2012 2:00 PM [name] Parody, MD [facility] [phone] None     Future Orders Please Complete By Expires   Diet general      Increase activity slowly          Medication List     As of 05/24/2012 11:41 AM    TAKE these medications         acetaminophen 325 MG tablet  Commonly known as: TYLENOL   Take 2 tablets (650 mg total) by mouth every 6 (six) hours as needed (or Fever >/= 101).      folic acid 1 MG tablet   Commonly known as: FOLVITE   Take 1 tablet (1 mg total) by mouth daily.      thiamine 100 MG tablet   Take 1 tablet (100 mg total) by mouth daily.      warfarin 10 MG tablet   Commonly known as: COUMADIN   Take 1.5 tablets (15 mg total) by mouth daily.           Follow-up Information    Follow up with [facility].   Contact information:   [address]  [place], [place] [address]    Monday to 8:00 am - 3:00 pm   Main Number: [phone]       Call [name] [facility] [employer]. (Daily regarding bed status for substance abuse)    Contact  information:   Address: [address] [place], [place] [address]  [phone]           The results of significant diagnostics from this hospitalization (including imaging, microbiology, ancillary and laboratory) are listed below for reference.    Significant Diagnostic Studies: Ct Abdomen Pelvis W Contrast  05/20/2012  *RADIOLOGY REPORT*  Clinical Data: Fall, abdominal pain.  On Coumadin.  CT ABDOMEN AND PELVIS WITH CONTRAST  Technique:  Multidetector CT imaging of the abdomen and pelvis was performed following the standard protocol during bolus administration of intravenous contrast.  Contrast: OMNIPAQUE IOHEXOL 300 MG/ML  SOLN  Comparison: 04/02/2012  Findings: Linear opacity within the periphery the right lower lobe is slightly more prominent/nodular in the interval. Measures similar in maximum diameter at 6 mm.  Diffuse low attenuation of the liver in keeping with hepatic steatosis.  Unremarkable biliary system, spleen, pancreas, adrenal glands.  There is bilateral renal pelves and ureteral thickening, urothelial hyperenhancement, and periureteral fat stranding to the level of the bladder.  No bowel obstruction.  No CT evidence for colitis.  Normal appendix.  No free intraperitoneal air or fluid.  No lymphadenopathy.  Scattered atherosclerosis of the aorta and branch vessels.  IVC filter in place. Again the legs are noted to extend outside of the vessel wall.  Decreased clot burden within the left external iliac vein though there are residual prominent pelvic subcutaneous anterior collateral vessels.  Circumferential bladder wall thickening.  No deep pelvic lymphadenopathy.  No acute osseous finding.  IMPRESSION: Bilateral ureteral thickening and mucosal hyperenhancement.  This is favored to reflect an ascending infection.  Correlate with urinalysis and ureteroscopy if clinically warranted.  Small amount of right paracolic fluid is presumably reactive to the above described process.  Decreased  clot burden within the left external iliac vein. Prominent collateral vessels are noted.  IVC filter in place.  6 mm peripheral density within the right lower lobe is slightly more nodular in the interval.  This may be secondary to differences in slice selection.  Recommend a short-term (3-60-month) CT chest follow-up.   Original Report Authenticated By: Waneta Martins, M.D.     Microbiology: Recent Results (from the past 240 hour(s))  MRSA PCR SCREENING     Status: Normal   Collection Time   05/20/12 11:30 PM      Component Value Range Status Comment   MRSA by PCR NEGATIVE  NEGATIVE Final      Labs:  Basic Metabolic Panel:  Lab 05/21/12 9562 05/20/12 1857  NA 135 134*  K 3.5 3.6  CL 98 95*  CO2 26 22  GLUCOSE 76 75  BUN 7 9  CREATININE 0.51 0.56  CALCIUM 8.2* 8.7  MG -- --  PHOS -- --   Liver Function Tests:  Lab 05/20/12 1857  AST 34  ALT 18  ALKPHOS 81  BILITOT 0.3  PROT 6.8  ALBUMIN 3.3*   No results found for this basename: LIPASE:5,AMYLASE:5 in the last 168 hours No results found for this basename: AMMONIA:5 in the last 168 hours CBC:  Lab 05/22/12 0355 05/21/12 2053 05/21/12 1536 05/21/12 0935 05/21/12 0325 05/20/12 1857  WBC -- -- -- -- 6.1 8.0  NEUTROABS -- -- -- -- -- 5.7  HGB 9.7* 10.4* 9.9* 9.5* 9.1* --  HCT 28.9* 30.8* 29.4* 28.4* 27.3* --  MCV -- -- -- -- 93.5 93.2  PLT -- -- -- -- 133* 156   Cardiac Enzymes: No results found for this basename: CKTOTAL:5,CKMB:5,CKMBINDEX:5,TROPONINI:5 in the last 168 hours BNP: BNP (last 3 results)  Basename 01/25/12 1316  PROBNP 75.9   CBG: No results found for this basename: GLUCAP:5 in the last 168 hours  Time coordinating discharge:  Signed:  Makila Colombe  [facility] 05/24/2012, 11:41 AM

## 2012-05-24 NOTE — Care Management Note (Signed)
  Page 1 of 1   05/24/2012     1:01:43 PM   CARE MANAGEMENT NOTE 05/24/2012  Patient:  [name],[name]   Account Number:  [id number]  Date Initiated:  05/24/2012  Documentation initiated by:  Ronny Flurry  Subjective/Objective Assessment:     Action/Plan:   Anticipated DC Date:  05/24/2012   Anticipated DC Plan:  IP REHAB FACILITY      DC Planning Services  Medication Assistance  CM consult      Choice offered to / List presented to:             Status of service:   Medicare Important Message given?   (If response is "NO", the following Medicare IM given date fields will be blank) Date Medicare IM given:   Date Additional Medicare IM given:    Discharge Disposition:  IP REHAB FACILITY  Per UR Regulation:    If discussed at Long Length of Stay Meetings, dates discussed:    Comments:  05-24-12 Coumadin prescription filled through main pharmacy. Ronny Flurry RN BSN [phone]

## 2012-05-26 ENCOUNTER — Other Ambulatory Visit: Payer: Self-pay | Admitting: Oncology

## 2012-05-26 ENCOUNTER — Telehealth: Payer: Self-pay | Admitting: *Deleted

## 2012-05-26 DIAGNOSIS — I82403 Acute embolism and thrombosis of unspecified deep veins of lower extremity, bilateral: Secondary | ICD-10-CM

## 2012-05-26 NOTE — Telephone Encounter (Signed)
Called [facility] nurse back to inform that Dr. Gaylyn Rong has decided to see pt next week w/ lab appt on 9/26.  Scheduling should be calling her w/ appt date and time.  She verbalized understanding.

## 2012-05-26 NOTE — Telephone Encounter (Signed)
Per Dr. Gaylyn Rong,  He will order for pt to be seen by coumadin clinic next week to check INR and manage coumadin.  Called Nurse, Dottie, back at [facility].  She says she is Engineer, water for [employer]).  They are a day program designed to assist homeless people and are helping pt. Until he can get into a residential facility.   Informed her that appt will be made for pt w/ coumadin clinic for sometime next week.  Dottie asks to be called for appt as pt is homeless, does not have phone number at this time.   Notified Melissa in Pharmacy of contact number for pt's appt is [phone].

## 2012-05-26 NOTE — Telephone Encounter (Signed)
Call from nurse at [facility] states pt concerned his appt to check INR is not until 06/16/12.  Pt d/c'd from hospital yesterday on coumadin and asking if his INR should be checked sooner than 10/10?

## 2012-05-27 ENCOUNTER — Telehealth: Payer: Self-pay | Admitting: Oncology

## 2012-05-27 NOTE — Telephone Encounter (Signed)
Per 9/19 pof called nurse Merril Abbe [phone]) and gv her pt pt appt for 9/26.

## 2012-06-01 NOTE — Patient Instructions (Signed)
1.  History of recurrent blood clot. 2.  Treatment:  Life long blood thinner. 3.  Options for blood thinner:  *  Coumadin:  Pros:  Cheap, presence of antidotes, low risk of clot if only missing a dose here and there.  Cons:  Needs to monitor closely.  High risk of bleeding if take alcohol.  [name]:  Pros:  No need to monitor. LOWER (not low) risk of bleeding if patients are drinking alcohol.  Cons:  High risk of clot if missing a dose.  No antidotes if there is bleeding.

## 2012-06-02 ENCOUNTER — Ambulatory Visit: Payer: Medicaid Other | Admitting: Oncology

## 2012-06-02 ENCOUNTER — Other Ambulatory Visit: Payer: Medicaid Other | Admitting: Lab

## 2012-06-02 NOTE — Progress Notes (Signed)
Again no show.   Pt will call if need to reschedule.  We do not have his contact information.  He is homeless.

## 2012-06-16 ENCOUNTER — Ambulatory Visit: Payer: Self-pay | Admitting: Oncology

## 2012-06-16 ENCOUNTER — Other Ambulatory Visit: Payer: Self-pay | Admitting: Lab

## 2012-06-16 NOTE — Progress Notes (Signed)
No show.  Reschedule prn.  

## 2012-07-01 ENCOUNTER — Emergency Department (HOSPITAL_COMMUNITY)
Admission: EM | Admit: 2012-07-01 | Discharge: 2012-07-02 | Disposition: A | Discharge status: Home / Self Care | Payer: Medicaid Other | Attending: Emergency Medicine | Admitting: Emergency Medicine

## 2012-07-01 ENCOUNTER — Encounter (HOSPITAL_COMMUNITY): Payer: Self-pay | Admitting: *Deleted

## 2012-07-01 DIAGNOSIS — Z86718 Personal history of other venous thrombosis and embolism: Secondary | ICD-10-CM | POA: Insufficient documentation

## 2012-07-01 DIAGNOSIS — IMO0001 Reserved for inherently not codable concepts without codable children: Secondary | ICD-10-CM | POA: Insufficient documentation

## 2012-07-01 DIAGNOSIS — Z7901 Long term (current) use of anticoagulants: Secondary | ICD-10-CM | POA: Insufficient documentation

## 2012-07-01 DIAGNOSIS — F17200 Nicotine dependence, unspecified, uncomplicated: Secondary | ICD-10-CM | POA: Insufficient documentation

## 2012-07-01 DIAGNOSIS — L930 Discoid lupus erythematosus: Secondary | ICD-10-CM | POA: Insufficient documentation

## 2012-07-01 DIAGNOSIS — Z8739 Personal history of other diseases of the musculoskeletal system and connective tissue: Secondary | ICD-10-CM | POA: Insufficient documentation

## 2012-07-01 DIAGNOSIS — I82409 Acute embolism and thrombosis of unspecified deep veins of unspecified lower extremity: Secondary | ICD-10-CM | POA: Insufficient documentation

## 2012-07-01 DIAGNOSIS — Z79899 Other long term (current) drug therapy: Secondary | ICD-10-CM | POA: Insufficient documentation

## 2012-07-01 DIAGNOSIS — F1010 Alcohol abuse, uncomplicated: Secondary | ICD-10-CM | POA: Insufficient documentation

## 2012-07-01 DIAGNOSIS — I2510 Atherosclerotic heart disease of native coronary artery without angina pectoris: Secondary | ICD-10-CM | POA: Insufficient documentation

## 2012-07-01 DIAGNOSIS — O2230 Deep phlebothrombosis in pregnancy, unspecified trimester: Secondary | ICD-10-CM

## 2012-07-01 DIAGNOSIS — Z86711 Personal history of pulmonary embolism: Secondary | ICD-10-CM | POA: Insufficient documentation

## 2012-07-01 LAB — COMPREHENSIVE METABOLIC PANEL
CO2: 22 mEq/L (ref 19–32)
Calcium: 8.8 mg/dL (ref 8.4–10.5)
Creatinine, Ser: 0.59 mg/dL (ref 0.50–1.35)
GFR calc Af Amer: >90 mL/min (ref >90)
GFR calc non Af Amer: >90 mL/min (ref >90)
Glucose, Bld: 77 mg/dL (ref 70–99)
Total Protein: 7.5 g/dL (ref 6.0–8.3)

## 2012-07-01 LAB — CBC WITH DIFFERENTIAL/PLATELET
Basophils Absolute: 0.1 10*3/uL (ref 0.0–0.1)
Eosinophils Absolute: 0.1 10*3/uL (ref 0.0–0.7)
Eosinophils Relative: 1 % (ref 0–5)
HCT: 37.9 % — ABNORMAL LOW (ref 39.0–52.0)
Lymphocytes Relative: 32 % (ref 12–46)
Lymphs Abs: 3.3 10*3/uL (ref 0.7–4.0)
MCH: 32.2 pg (ref 26.0–34.0)
MCV: 96.2 fL (ref 78.0–100.0)
Monocytes Absolute: 0.9 10*3/uL (ref 0.1–1.0)
RDW: 16.2 % — ABNORMAL HIGH (ref 11.5–15.5)
WBC: 10 10*3/uL (ref 4.0–10.5)

## 2012-07-01 MED ORDER — SODIUM CHLORIDE 0.9 % IV SOLN
Freq: Once | INTRAVENOUS | Status: AC
  Administered 2012-07-01: 23:00:00 via INTRAVENOUS

## 2012-07-01 MED ORDER — ENOXAPARIN SODIUM 80 MG/0.8ML ~~LOC~~ SOLN
80.0000 mg | Freq: Once | SUBCUTANEOUS | Status: AC
  Administered 2012-07-01: 80 mg via SUBCUTANEOUS
  Filled 2012-07-01: qty 0.8

## 2012-07-01 MED ORDER — WARFARIN SODIUM 5 MG PO TABS: 15.0000 mg | ORAL_TABLET | Freq: Every day | ORAL | Status: DC

## 2012-07-01 NOTE — ED Notes (Signed)
Pt presents w/ c/o LLE pain 6/10 and swelling. Hx of DVT and is out of coumadin x 1 week. Pt also has c/o ShoB.

## 2012-07-01 NOTE — ED Notes (Addendum)
EKG given to EDP,Nanavanti, Ankit,MD. 

## 2012-07-01 NOTE — ED Notes (Signed)
Pt states he missed most recent doctor's appt. Pt admits to ETOH use x 24 today. Pt states he took ASA.

## 2012-07-02 NOTE — ED Provider Notes (Signed)
History     CSN: [id number]  Arrival date & time 07/01/12  2117   First MD Initiated Contact with Patient 07/01/12 2129      Chief Complaint  Patient presents with  . Claudication    (Consider location/radiation/quality/duration/timing/severity/associated sxs/prior treatment) HPI Comments: Pt comes in with cc of calf pain and med refill. States that he has hx of DVT, and he has not been taking his coumadin for a week now. He has not bee unable to go to his pcp for various reasons, and records indicate at least 2 missed visits over the past 1+ months. Pt has no dib, does admit to some dizziness. Pt is als drinking daily. No chest pains. The calf pain is on the left side, same area as his DVT, and it is constant pain - typically worse with walking.  The history is provided by the patient.    Past Medical History  Diagnosis Date  . Coronary artery disease   . Degenerative joint disease of spine   . Pulmonary embolism 06/2010  . Degenerative joint disease   . 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 ETOH abuse   . DVT (deep venous thrombosis)     Past Surgical History  Procedure Date  . Left elbow surgery   . Tonsillectomy     History reviewed. No pertinent family history.  History  Substance Use Topics  . Smoking status: Current Every Day Smoker -- 3.0 packs/day for 30 years    Types: Cigarettes  . Smokeless tobacco: Never Used  . Alcohol Use: Yes     heavy drinker daily      Review of Systems  Constitutional: Negative for activity change and appetite change.  Respiratory: Negative for cough and shortness of breath.   Cardiovascular: Negative for chest pain.  Gastrointestinal: Negative for abdominal pain.  Genitourinary: Negative for dysuria.  Musculoskeletal: Positive for myalgias.    Allergies  Review of patient's allergies indicates no known allergies.  Home Medications   Current Outpatient Rx  Name Route Sig Dispense Refill  . ADULT MULTIVITAMIN  W/MINERALS CH Oral Take 1 tablet by mouth daily.    . WARFARIN SODIUM 10 MG PO TABS Oral Take 1.5 tablets (15 mg total) by mouth daily. 60 tablet 1  . WARFARIN SODIUM 5 MG PO TABS Oral Take 3 tablets (15 mg total) by mouth daily. 30 tablet 0    BP 113/55  Pulse 94  Temp 97.4 F (36.3 C) (Oral)  Resp 19  SpO2 93%  Physical Exam  Nursing note and vitals reviewed. Constitutional: He is oriented to person, place, and time. He appears well-developed.  HENT:  Head: Normocephalic and atraumatic.  Eyes: Conjunctivae normal and EOM are normal. Pupils are equal, round, and reactive to light.  Neck: Normal range of motion. Neck supple.  Cardiovascular: Normal rate and regular rhythm.   Pulmonary/Chest: Effort normal and breath sounds normal.  Abdominal: Soft. Bowel sounds are normal. He exhibits no distension. There is no tenderness. There is no rebound and no guarding.  Musculoskeletal:       Pt has 2+ DP and PT pulses, and the skin is warm with no overlying erythema. Tenderness to palpation of the calf region, no palpable cords.  Neurological: He is alert and oriented to person, place, and time.  Skin: Skin is warm.    ED Course  Procedures (including critical care time)  Labs Reviewed  CBC WITH DIFFERENTIAL - Abnormal; Notable for the following:    RBC 3.94 (*)  Hemoglobin 12.7 (*)     HCT 37.9 (*)     RDW 16.2 (*)     All other components within normal limits  COMPREHENSIVE METABOLIC PANEL - Abnormal; Notable for the following:    BUN 5 (*)     AST 62 (*)     All other components within normal limits  PROTIME-INR  D-DIMER, QUANTITATIVE   No results found.   1. DVT (deep vein thrombosis) in pregnancy       MDM  Pt comes in with DVT related pain. No evidence of ischemia from the thrombosis. Will give a shot of lovenox, and coumadin prescription as per the coumadin he was taking before (15 mg q daily). I URGED PATIENT NOT TO DRINK, AND TO SEE HIS PCP SOON. He agrees.  He has the phone number to the clinic, in fact he states he has missedcall on his cell phone - he just hasnt called back. Also has a case manager trying to help him with insurance - so i believe her has appropriate f/u and some help on the SW side to help with his prescriptions.       Derwood Kaplan, MD 07/02/12 4098

## 2012-07-11 ENCOUNTER — Telehealth: Payer: Self-pay | Admitting: Oncology

## 2012-07-11 NOTE — Telephone Encounter (Signed)
returned pts call....needed to reschedule missed appt....done

## 2012-07-12 NOTE — Patient Instructions (Signed)
1.  Diagnosis:  Recurrent clots. 2.  Treatment: lifelong blood thinner. 3.  Recommendation:  Switching from Coumadin to Xeralto.  Benefit of Coumadin:  Cheap, existence of antidotes.  Benefit of Xeralto:  No need to monitor dosing.  Safer to use in patients with cirrhosis.  No interaction with foods and antibiotics.  However, there is no known effective antidotes for [name] is there is bleeding.

## 2012-07-13 ENCOUNTER — Other Ambulatory Visit: Payer: Medicaid Other | Admitting: Lab

## 2012-07-13 ENCOUNTER — Ambulatory Visit: Payer: Medicaid Other | Admitting: Oncology

## 2012-07-14 NOTE — Progress Notes (Signed)
This is the 3rd no show.  Pt is homeless.  Return prn.

## 2012-08-04 ENCOUNTER — Emergency Department (HOSPITAL_COMMUNITY)
Admission: EM | Admit: 2012-08-04 | Discharge: 2012-08-05 | Disposition: A | Discharge status: Home / Self Care | Payer: Medicaid Other | Attending: Emergency Medicine | Admitting: Emergency Medicine

## 2012-08-04 ENCOUNTER — Emergency Department (HOSPITAL_COMMUNITY): Payer: Medicaid Other

## 2012-08-04 ENCOUNTER — Encounter (HOSPITAL_COMMUNITY): Payer: Self-pay

## 2012-08-04 DIAGNOSIS — F10929 Alcohol use, unspecified with intoxication, unspecified: Secondary | ICD-10-CM

## 2012-08-04 DIAGNOSIS — Y929 Unspecified place or not applicable: Secondary | ICD-10-CM | POA: Insufficient documentation

## 2012-08-04 DIAGNOSIS — Y939 Activity, unspecified: Secondary | ICD-10-CM | POA: Insufficient documentation

## 2012-08-04 DIAGNOSIS — T148XXA Other injury of unspecified body region, initial encounter: Secondary | ICD-10-CM

## 2012-08-04 DIAGNOSIS — F10229 Alcohol dependence with intoxication, unspecified: Secondary | ICD-10-CM | POA: Insufficient documentation

## 2012-08-04 DIAGNOSIS — I2510 Atherosclerotic heart disease of native coronary artery without angina pectoris: Secondary | ICD-10-CM | POA: Insufficient documentation

## 2012-08-04 DIAGNOSIS — M549 Dorsalgia, unspecified: Secondary | ICD-10-CM | POA: Insufficient documentation

## 2012-08-04 DIAGNOSIS — F1010 Alcohol abuse, uncomplicated: Secondary | ICD-10-CM | POA: Insufficient documentation

## 2012-08-04 DIAGNOSIS — S161XXA Strain of muscle, fascia and tendon at neck level, initial encounter: Secondary | ICD-10-CM

## 2012-08-04 DIAGNOSIS — F17200 Nicotine dependence, unspecified, uncomplicated: Secondary | ICD-10-CM | POA: Insufficient documentation

## 2012-08-04 DIAGNOSIS — L930 Discoid lupus erythematosus: Secondary | ICD-10-CM | POA: Insufficient documentation

## 2012-08-04 DIAGNOSIS — R079 Chest pain, unspecified: Secondary | ICD-10-CM | POA: Insufficient documentation

## 2012-08-04 DIAGNOSIS — R0602 Shortness of breath: Secondary | ICD-10-CM | POA: Insufficient documentation

## 2012-08-04 DIAGNOSIS — B1910 Unspecified viral hepatitis B without hepatic coma: Secondary | ICD-10-CM | POA: Insufficient documentation

## 2012-08-04 DIAGNOSIS — R55 Syncope and collapse: Secondary | ICD-10-CM

## 2012-08-04 DIAGNOSIS — W1789XA Other fall from one level to another, initial encounter: Secondary | ICD-10-CM | POA: Insufficient documentation

## 2012-08-04 DIAGNOSIS — S139XXA Sprain of joints and ligaments of unspecified parts of neck, initial encounter: Secondary | ICD-10-CM | POA: Insufficient documentation

## 2012-08-04 DIAGNOSIS — Z7901 Long term (current) use of anticoagulants: Secondary | ICD-10-CM | POA: Insufficient documentation

## 2012-08-04 DIAGNOSIS — Z86718 Personal history of other venous thrombosis and embolism: Secondary | ICD-10-CM | POA: Insufficient documentation

## 2012-08-04 DIAGNOSIS — Z79899 Other long term (current) drug therapy: Secondary | ICD-10-CM | POA: Insufficient documentation

## 2012-08-04 DIAGNOSIS — Z86711 Personal history of pulmonary embolism: Secondary | ICD-10-CM | POA: Insufficient documentation

## 2012-08-04 HISTORY — DX: Unspecified viral hepatitis B without hepatic coma: B19.10

## 2012-08-04 LAB — CBC WITH DIFFERENTIAL/PLATELET
Eosinophils Relative: 1 % (ref 0–5)
HCT: 40.7 % (ref 39.0–52.0)
Lymphocytes Relative: 35 % (ref 12–46)
Lymphs Abs: 2.5 10*3/uL (ref 0.7–4.0)
MCV: 94.4 fL (ref 78.0–100.0)
Monocytes Absolute: 0.3 10*3/uL (ref 0.1–1.0)
Monocytes Relative: 5 % (ref 3–12)
RBC: 4.31 MIL/uL (ref 4.22–5.81)
WBC: 7.1 10*3/uL (ref 4.0–10.5)

## 2012-08-04 LAB — BASIC METABOLIC PANEL
CO2: 25 mEq/L (ref 19–32)
Calcium: 8.9 mg/dL (ref 8.4–10.5)
Creatinine, Ser: 0.63 mg/dL (ref 0.50–1.35)
Glucose, Bld: 77 mg/dL (ref 70–99)

## 2012-08-04 MED ORDER — SODIUM CHLORIDE 0.9 % IV BOLUS (SEPSIS)
1000.0000 mL | Freq: Once | INTRAVENOUS | Status: AC
  Administered 2012-08-04: 1000 mL via INTRAVENOUS

## 2012-08-04 MED ORDER — THIAMINE HCL 100 MG/ML IJ SOLN
100.0000 mg | Freq: Every day | INTRAMUSCULAR | Status: DC
  Administered 2012-08-04: 100 mg via INTRAVENOUS
  Filled 2012-08-04: qty 2

## 2012-08-04 MED ORDER — SODIUM CHLORIDE 0.9 % IV SOLN
Freq: Once | INTRAVENOUS | Status: AC
  Administered 2012-08-04: 23:00:00 via INTRAVENOUS

## 2012-08-04 NOTE — ED Notes (Signed)
Pt given water per request

## 2012-08-04 NOTE — ED Notes (Addendum)
Pt took c-collar off; c-collar found on floor by RN; pt stated that he did not want to wear it anymore, "neck not broke"; pt informed of importance of wearing collar; pt verbalized understanding and still removed c-collar;

## 2012-08-04 NOTE — ED Notes (Signed)
Pt transported to xray 

## 2012-08-04 NOTE — ED Notes (Signed)
Pt given [place] soft collar; pt refused to wear soft collar; pt informed of importance of wearing soft collar; pt verbalized understanding stating "my neck not broke";

## 2012-08-04 NOTE — ED Notes (Signed)
Pt logrolled off back board with c-spine intact with assistance of 2 RNs and EMT; MD at bedside to assess;

## 2012-08-04 NOTE — ED Notes (Signed)
Pt back from CT

## 2012-08-04 NOTE — ED Notes (Signed)
Per EMS, pt states that he had a syncopal episode and lost consciousness for about 1-2 minutes; pt has had about 4 beers today; pt is easily irritated and aggressive per EMS; VSS

## 2012-08-05 LAB — RAPID URINE DRUG SCREEN, HOSP PERFORMED
Barbiturates: NOT DETECTED
Cocaine: NOT DETECTED
Tetrahydrocannabinol: NOT DETECTED

## 2012-08-05 LAB — URINALYSIS, ROUTINE W REFLEX MICROSCOPIC
Bilirubin Urine: NEGATIVE
Nitrite: NEGATIVE
Specific Gravity, Urine: 1.023 (ref 1.005–1.030)
Urobilinogen, UA: 0.2 mg/dL (ref 0.0–1.0)

## 2012-08-05 LAB — TROPONIN I: Troponin I: <0.3 ng/mL (ref <0.30)

## 2012-08-05 MED ORDER — IBUPROFEN 600 MG PO TABS: 600.0000 mg | ORAL_TABLET | Freq: Four times a day (QID) | ORAL | Status: DC | PRN

## 2012-08-05 NOTE — ED Provider Notes (Addendum)
History     CSN: [id number]  Arrival date & time 08/04/12  1851   First MD Initiated Contact with Patient 08/04/12 1857      Chief Complaint  Patient presents with  . Loss of Consciousness  . Chest Pain  . Shortness of Breath    (Consider location/radiation/quality/duration/timing/severity/associated sxs/prior treatment) HPI Comments: Pt comes in with cc of syncope. Level 5 caveat -patient is intoxicated. Pt is intoxicated, and brought in by EMS as he allegedly passed out. Pt has hx of DVT/PE, is on coumadin and CAD, but no CHF. He reports going to post office, where he passed out. He had no chest pain, palpitations that he can recall, but does admit that he has been having some ches pain, sob off and on. No nausea, no diaphoresis. Pt also complains of back pain and neck pain from the fall. No numbness, tingling, weakness. Pt denies any illicits, and EMS reports that patient was found on the floor, with multiple beer cans around him.   Patient is a 46 y.o. male presenting with syncope, chest pain, and shortness of breath. The history is provided by the patient.  Loss of Consciousness Associated symptoms include chest pain and shortness of breath.  Chest Pain Primary symptoms include syncope and shortness of breath. Pertinent negatives for primary symptoms include no nausea and no vomiting.  There was no nausea. The syncopal episode occurred with shortness of breath.    Shortness of Breath  Associated symptoms include chest pain and shortness of breath.    Past Medical History  Diagnosis Date  . Coronary artery disease   . Degenerative joint disease of spine   . Pulmonary embolism 06/2010  . Degenerative joint disease   . 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 ETOH abuse   . DVT (deep venous thrombosis)   . Hepatitis B     Past Surgical History  Procedure Date  . Left elbow surgery   . Tonsillectomy     No family history on file.  History  Substance Use  Topics  . Smoking status: Current Every Day Smoker -- 3.0 packs/day for 30 years    Types: Cigarettes  . Smokeless tobacco: Never Used  . Alcohol Use: Yes     Comment: heavy drinker daily      Review of Systems  Unable to perform ROS: Other  Constitutional: Negative for activity change and appetite change.  HENT: Positive for neck pain.   Respiratory: Positive for shortness of breath.   Cardiovascular: Positive for chest pain and syncope.  Gastrointestinal: Negative for nausea and vomiting.  Neurological: Positive for syncope.    Allergies  Review of patient's allergies indicates no known allergies.  Home Medications   Current Outpatient Rx  Name  Route  Sig  Dispense  Refill  . ACETAMINOPHEN 325 MG PO TABS   Oral   Take 650 mg by mouth every 6 (six) hours as needed. For pain         . ADULT MULTIVITAMIN W/MINERALS CH   Oral   Take 1 tablet by mouth daily.         . WARFARIN SODIUM 5 MG PO TABS   Oral   Take 20 mg by mouth daily.           BP 149/68  Pulse 104  Temp 97.8 F (36.6 C) (Oral)  Resp 14  SpO2 97%  Physical Exam  Nursing note and vitals reviewed. Constitutional: He is oriented to person, place, and time.  He appears well-developed.  HENT:  Head: Normocephalic and atraumatic.       ccollar on, midline c spine tenderness.  Eyes: Conjunctivae normal and EOM are normal. Pupils are equal, round, and reactive to light.  Neck: Normal range of motion. Neck supple. No JVD present.  Cardiovascular: Normal rate and regular rhythm.   Pulmonary/Chest: Effort normal and breath sounds normal.  Abdominal: Soft. Bowel sounds are normal. He exhibits no distension. There is no tenderness. There is no rebound and no guarding.  Musculoskeletal:       Head to toe evaluation shows no hematoma, bleeding of the scalp, no facial abrasions, step offs, crepitus, no tenderness to palpation of the bilateral upper and lower extremities, no gross deformities, no chest  tenderness, no pelvic pain.  Pt has thoracic and lumbar tenderness at multiple levels.  Neurological: He is alert and oriented to person, place, and time.       Normal sensory exam of the upper and lower extremity.  Skin: Skin is warm.    ED Course  Procedures (including critical care time)  Labs Reviewed  ETHANOL - Abnormal; Notable for the following:    Alcohol, Ethyl (B) 229 (*)     All other components within normal limits  PROTIME-INR - Abnormal; Notable for the following:    Prothrombin Time 26.4 (*)     INR 2.58 (*)     All other components within normal limits  APTT - Abnormal; Notable for the following:    aPTT 41 (*)     All other components within normal limits  BASIC METABOLIC PANEL - Abnormal; Notable for the following:    Potassium 3.4 (*)     All other components within normal limits  CBC WITH DIFFERENTIAL  TROPONIN I  URINE RAPID DRUG SCREEN (HOSP PERFORMED)  URINALYSIS, ROUTINE W REFLEX MICROSCOPIC   Dg Thoracic Spine 2 View  08/04/2012  *RADIOLOGY REPORT*  Clinical Data: 46 year old male with mid back pain following fall.  THORACIC SPINE - 2 VIEW  Comparison: 01/25/2012 chest radiograph  Findings: Normal alignment is noted. There is no evidence of fracture or subluxation. The disc spaces are maintained. No focal bony lesions are identified.  IMPRESSION: Unremarkable thoracic spine.   Original Report Authenticated By: Harmon Pier, M.D.    Dg Lumbar Spine Complete  08/04/2012  *RADIOLOGY REPORT*  Clinical Data: 46 year old male with low back pain following fall.  LUMBAR SPINE - COMPLETE 4+ VIEW  Comparison: 05/20/2012 CT  Findings: Five non-rib bearing lumbar type vertebra are again identified. A minimal apex left lumbar scoliosis is present. There is no evidence of fracture or subluxation. Moderate degenerative disc disease at L4-L5 and L5-S1 again noted. There is no evidence of focal bony lesion or spondylolysis.  An IVC filter is present.  IMPRESSION: No evidence  of acute bony abnormality.  Moderate degenerative disc disease at L4-L5 and L5-S1.   Original Report Authenticated By: Harmon Pier, M.D.    Ct Head Wo Contrast  08/04/2012  *RADIOLOGY REPORT*  Clinical Data:  46 year old male with syncope, loss of consciousness and headache and neck pain.  CT HEAD WITHOUT CONTRAST CT CERVICAL SPINE WITHOUT CONTRAST  Technique:  Multidetector CT imaging of the head and cervical spine was performed following the standard protocol without intravenous contrast.  Multiplanar CT image reconstructions of the cervical spine were also generated.  Comparison:  None  CT HEAD  Findings: Mild atrophy is noted.  No acute intracranial abnormalities are identified, including mass lesion or mass  effect, hydrocephalus, extra-axial fluid collection, midline shift, hemorrhage, or acute infarction.  The visualized bony calvarium is unremarkable.  IMPRESSION: No evidence of acute intracranial abnormality.  Atrophy.  CT CERVICAL SPINE  Findings: Loss of the normal cervical lordosis is identified. There is no evidence of acute fracture, subluxation or prevertebral soft tissue swelling.  Mild to moderate degenerative disc disease and spondylosis is identified at several levels contributing to bony foraminal narrowing as follows: Mild to moderate on the left at C3-C4 Moderate on the right at C4-C5 Mild on the left at C5-C6.  No focal bony lesions are identified. The soft tissue structures are within normal limits.  IMPRESSION: No static evidence of acute injury to the cervical spine.  Degenerative changes as described.   Original Report Authenticated By: Harmon Pier, M.D.    Ct Cervical Spine Wo Contrast  08/04/2012  *RADIOLOGY REPORT*  Clinical Data:  46 year old male with syncope, loss of consciousness and headache and neck pain.  CT HEAD WITHOUT CONTRAST CT CERVICAL SPINE WITHOUT CONTRAST  Technique:  Multidetector CT imaging of the head and cervical spine was performed following the standard  protocol without intravenous contrast.  Multiplanar CT image reconstructions of the cervical spine were also generated.  Comparison:  None  CT HEAD  Findings: Mild atrophy is noted.  No acute intracranial abnormalities are identified, including mass lesion or mass effect, hydrocephalus, extra-axial fluid collection, midline shift, hemorrhage, or acute infarction.  The visualized bony calvarium is unremarkable.  IMPRESSION: No evidence of acute intracranial abnormality.  Atrophy.  CT CERVICAL SPINE  Findings: Loss of the normal cervical lordosis is identified. There is no evidence of acute fracture, subluxation or prevertebral soft tissue swelling.  Mild to moderate degenerative disc disease and spondylosis is identified at several levels contributing to bony foraminal narrowing as follows: Mild to moderate on the left at C3-C4 Moderate on the right at C4-C5 Mild on the left at C5-C6.  No focal bony lesions are identified. The soft tissue structures are within normal limits.  IMPRESSION: No static evidence of acute injury to the cervical spine.  Degenerative changes as described.   Original Report Authenticated By: Harmon Pier, M.D.      No diagnosis found.    MDM  Patient comes in with cc of syncope. Pt is intoxicated. He has neck pain as well.  Syncope - likely vasovagal, or alcohol related or due to dehydration. EF is 65%, preserved, which is reassuring, and the EKF is not showing any interval irregularity. Not checking o/s as he has neck pain. Will hydrate, get trops x 2 and ekg.  Pt has hx of PE, will check his inr.If therapeutic, no need for CT - PE. ACS workup today will be limited to trops x 2, and we will request pcp followup.  Will get appropriate radiographs and Ct head.   Date: 08/05/2012  Rate: 104  Rhythm: sinus tachycardia  QRS Axis: normal  Intervals: normal  ST/T Wave abnormalities: normal  Conduction Disutrbances:none  Narrative Interpretation:   Old EKG Reviewed:  unchanged    Derwood Kaplan, MD 08/05/12 1610  Derwood Kaplan, MD 08/05/12 9604

## 2012-08-05 NOTE — ED Notes (Signed)
Gave pt sandwich and drink  

## 2012-09-29 ENCOUNTER — Emergency Department (HOSPITAL_COMMUNITY)
Admission: EM | Admit: 2012-09-29 | Discharge: 2012-09-29 | Disposition: A | Discharge status: Home / Self Care | Payer: Medicaid Other | Attending: Emergency Medicine | Admitting: Emergency Medicine

## 2012-09-29 ENCOUNTER — Encounter (HOSPITAL_COMMUNITY): Payer: Self-pay | Admitting: *Deleted

## 2012-09-29 DIAGNOSIS — Z046 Encounter for general psychiatric examination, requested by authority: Secondary | ICD-10-CM | POA: Insufficient documentation

## 2012-09-29 LAB — CBC
Hemoglobin: 14.1 g/dL (ref 13.0–17.0)
MCH: 30.9 pg (ref 26.0–34.0)
MCHC: 33.4 g/dL (ref 30.0–36.0)
MCV: 92.5 fL (ref 78.0–100.0)

## 2012-09-29 LAB — COMPREHENSIVE METABOLIC PANEL
BUN: 3 mg/dL — ABNORMAL LOW (ref 6–23)
Calcium: 8.4 mg/dL (ref 8.4–10.5)
Creatinine, Ser: 0.62 mg/dL (ref 0.50–1.35)
GFR calc Af Amer: >90 mL/min (ref >90)
Glucose, Bld: 90 mg/dL (ref 70–99)
Sodium: 140 mEq/L (ref 135–145)
Total Protein: 7.8 g/dL (ref 6.0–8.3)

## 2012-09-29 LAB — ETHANOL: Alcohol, Ethyl (B): 392 mg/dL — ABNORMAL HIGH (ref 0–11)

## 2012-09-29 LAB — SALICYLATE LEVEL: Salicylate Lvl: <2 mg/dL — ABNORMAL LOW (ref 2.8–20.0)

## 2012-09-29 NOTE — ED Provider Notes (Signed)
Went to see patient. He has reportedly been taken by [employer]. Reportedly does not want further evaluation.   Juliet Rude. Rubin Payor, MD 09/29/12 [phone]

## 2012-09-29 NOTE — ED Notes (Addendum)
Pt requesting detox. Sts he drinks a fifth of liquor and a case of beer a day. Last drank this am. Reports one 40oz beer today. Also reports vomiting and urinating blood. Pt sts he is on coumadin and does not know how much he has taken and does not know if he is "therapeutic." Denies SI.

## 2012-09-29 NOTE — ED Notes (Signed)
Pt acting out and being verbally abusive while in triage. Urinated in consultation room trash can twice. [employer] taking pt to drunk tank. Pt denies SI.

## 2012-09-29 NOTE — ED Notes (Signed)
Bed:[facility]<BR> Expected date:09/29/12<BR> Expected time:12:15 PM<BR> Means of arrival:Ambulance<BR> Comments:<BR>

## 2012-10-10 ENCOUNTER — Emergency Department (HOSPITAL_COMMUNITY)
Admission: EM | Admit: 2012-10-10 | Discharge: 2012-10-10 | Disposition: A | Discharge status: Home / Self Care | Payer: Medicaid Other | Attending: Emergency Medicine | Admitting: Emergency Medicine

## 2012-10-10 ENCOUNTER — Encounter (HOSPITAL_COMMUNITY): Payer: Self-pay | Admitting: *Deleted

## 2012-10-10 DIAGNOSIS — Z86711 Personal history of pulmonary embolism: Secondary | ICD-10-CM | POA: Insufficient documentation

## 2012-10-10 DIAGNOSIS — Z8619 Personal history of other infectious and parasitic diseases: Secondary | ICD-10-CM | POA: Insufficient documentation

## 2012-10-10 DIAGNOSIS — I2510 Atherosclerotic heart disease of native coronary artery without angina pectoris: Secondary | ICD-10-CM | POA: Insufficient documentation

## 2012-10-10 DIAGNOSIS — L02419 Cutaneous abscess of limb, unspecified: Secondary | ICD-10-CM | POA: Insufficient documentation

## 2012-10-10 DIAGNOSIS — M79609 Pain in unspecified limb: Secondary | ICD-10-CM | POA: Insufficient documentation

## 2012-10-10 DIAGNOSIS — L03119 Cellulitis of unspecified part of limb: Secondary | ICD-10-CM | POA: Insufficient documentation

## 2012-10-10 DIAGNOSIS — Z7901 Long term (current) use of anticoagulants: Secondary | ICD-10-CM | POA: Insufficient documentation

## 2012-10-10 DIAGNOSIS — R791 Abnormal coagulation profile: Secondary | ICD-10-CM | POA: Insufficient documentation

## 2012-10-10 DIAGNOSIS — D689 Coagulation defect, unspecified: Secondary | ICD-10-CM | POA: Insufficient documentation

## 2012-10-10 DIAGNOSIS — L03116 Cellulitis of left lower limb: Secondary | ICD-10-CM

## 2012-10-10 DIAGNOSIS — Z79899 Other long term (current) drug therapy: Secondary | ICD-10-CM | POA: Insufficient documentation

## 2012-10-10 DIAGNOSIS — F1010 Alcohol abuse, uncomplicated: Secondary | ICD-10-CM | POA: Insufficient documentation

## 2012-10-10 DIAGNOSIS — F17200 Nicotine dependence, unspecified, uncomplicated: Secondary | ICD-10-CM | POA: Insufficient documentation

## 2012-10-10 DIAGNOSIS — Z86718 Personal history of other venous thrombosis and embolism: Secondary | ICD-10-CM | POA: Insufficient documentation

## 2012-10-10 DIAGNOSIS — Z8739 Personal history of other diseases of the musculoskeletal system and connective tissue: Secondary | ICD-10-CM | POA: Insufficient documentation

## 2012-10-10 DIAGNOSIS — I82409 Acute embolism and thrombosis of unspecified deep veins of unspecified lower extremity: Secondary | ICD-10-CM

## 2012-10-10 LAB — CBC
HCT: 36.6 % — ABNORMAL LOW (ref 39.0–52.0)
MCV: 93.6 fL (ref 78.0–100.0)
Platelets: 289 10*3/uL (ref 150–400)
RBC: 3.91 MIL/uL — ABNORMAL LOW (ref 4.22–5.81)
WBC: 6 10*3/uL (ref 4.0–10.5)

## 2012-10-10 LAB — BASIC METABOLIC PANEL
CO2: 24 mEq/L (ref 19–32)
Chloride: 107 mEq/L (ref 96–112)
Creatinine, Ser: 0.58 mg/dL (ref 0.50–1.35)
Potassium: 3.8 mEq/L (ref 3.5–5.1)

## 2012-10-10 LAB — PROTIME-INR: Prothrombin Time: 41.4 seconds — ABNORMAL HIGH (ref 11.6–15.2)

## 2012-10-10 MED ORDER — CLINDAMYCIN HCL 150 MG PO CAPS: 150.0000 mg | ORAL_CAPSULE | Freq: Four times a day (QID) | ORAL | Status: DC

## 2012-10-10 MED ORDER — CLINDAMYCIN HCL 300 MG PO CAPS
300.0000 mg | ORAL_CAPSULE | Freq: Once | ORAL | Status: AC
  Administered 2012-10-10: 300 mg via ORAL
  Filled 2012-10-10: qty 1

## 2012-10-10 NOTE — ED Provider Notes (Signed)
History     CSN: [id number]  Arrival date & time 10/10/12  1528   First MD Initiated Contact with Patient 10/10/12 1659      Chief Complaint  Patient presents with  . Leg Swelling    (Consider location/radiation/quality/duration/timing/severity/associated sxs/prior treatment) HPI Pt presents with c/o left leg swelling and pain.  Pt has hx of DVT and is taking coumadin.  He states the left lower leg became red and swollen over the past several days.  He denies fainting, no chest pain or shortness of breath.  He states he last took his coumadin yesterday.  No fever/chills.  No vomiting.  There are no other associated systemic symptoms, there are no other alleviating or modifying factors.   Past Medical History  Diagnosis Date  . Coronary artery disease   . Degenerative joint disease of spine   . Pulmonary embolism 06/2010  . Degenerative joint disease   . 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 ETOH abuse   . DVT (deep venous thrombosis)   . Hepatitis B     Past Surgical History  Procedure Date  . Left elbow surgery   . Tonsillectomy     No family history on file.  History  Substance Use Topics  . Smoking status: Current Every Day Smoker -- 3.0 packs/day for 30 years    Types: Cigarettes  . Smokeless tobacco: Never Used  . Alcohol Use: Yes     Comment: heavy drinker daily      Review of Systems ROS reviewed and all otherwise negative except for mentioned in HPI  Allergies  Review of patient's allergies indicates no known allergies.  Home Medications   Current Outpatient Rx  Name  Route  Sig  Dispense  Refill  . ACETAMINOPHEN 325 MG PO TABS   Oral   Take 650 mg by mouth every 6 (six) hours as needed. Pain.         . BC HEADACHE POWDER PO   Oral   Take 1 packet by mouth daily as needed. Pain.         . ADULT MULTIVITAMIN W/MINERALS CH   Oral   Take 1 tablet by mouth daily.         . WARFARIN SODIUM 10 MG PO TABS   Oral   Take 20 mg by mouth  daily.         Marland Kitchen CLINDAMYCIN HCL 150 MG PO CAPS   Oral   Take 1 capsule (150 mg total) by mouth every 6 (six) hours.   28 capsule   0     BP 111/83  Pulse 95  Temp 97.6 F (36.4 C) (Oral)  Resp 18  SpO2 97% Vitals reviewed Physical Exam Physical Examination: General appearance - sleeping but arousable, somewhat disheveled, well appearing, and in no distress Mental status - alert, oriented to person, place, and time Mouth - mucous membranes moist, pharynx normal without lesions Chest - clear to auscultation, no wheezes, rales or rhonchi, symmetric air entry Heart - normal rate, regular rhythm, normal S1, S2, no murmurs, rubs, clicks or gallops Abdomen - soft, nontender, nondistended, no masses or organomegaly Musculoskeletal - no joint tenderness, deformity or swelling Extremities - peripheral pulses normal, assymetric edema of left lower extremity compared to right, nonpitting, no clubbing or cyanosis Skin - left lower extremity with erythema overlying anterior tibial region, otherwise no rashes, brisk cap refill  ED Course  Procedures (including critical care time)  Labs Reviewed  CBC - Abnormal; Notable  for the following:    RBC 3.91 (*)     Hemoglobin 12.1 (*)     HCT 36.6 (*)     RDW 17.8 (*)     All other components within normal limits  PROTIME-INR - Abnormal; Notable for the following:    Prothrombin Time 41.4 (*)     INR 4.73 (*)     All other components within normal limits  BASIC METABOLIC PANEL   No results found.   1. Cellulitis of left lower extremity   2. Supratherapeutic INR       MDM  Pt presents with c/o left lower extermity redness and swelling.  Has hx of DVT and INR is slightly elevated, but not subtherapeutic.  Extremity is slightly erythematous and warm.  Pt instructed to hold one dose of coumadin today and then restart.  Will start on clindamycin due to possible cellulitis.  He should have INR rechecked in 1 week.  Discharged with strict  return precautions.  Pt agreeable with plan.        Ethelda Chick, MD 10/10/12 2102

## 2012-10-10 NOTE — ED Notes (Signed)
Per EMS, pt reports LLE pain and swelling.  Dx recently with DVT-took his last coumadin x 24 hours ago.

## 2012-10-10 NOTE — ED Notes (Signed)
Vascular tech at bedside for study.

## 2012-10-10 NOTE — Progress Notes (Signed)
Left:  No evidence of DVT, superficial thrombosis, or Baker's cyst.  Right:  Negative for DVT in the common femoral vein.  

## 2012-10-12 ENCOUNTER — Telehealth: Payer: Self-pay | Admitting: *Deleted

## 2012-10-12 NOTE — Telephone Encounter (Signed)
lvm for pt on [phone] regarding to tomorrows appt.Marland KitchenMarland Kitchen

## 2012-10-13 ENCOUNTER — Ambulatory Visit: Payer: Self-pay | Admitting: Oncology

## 2012-10-13 ENCOUNTER — Telehealth: Payer: Self-pay | Admitting: Oncology

## 2012-10-13 ENCOUNTER — Other Ambulatory Visit: Payer: Self-pay | Admitting: Lab

## 2012-10-13 NOTE — Telephone Encounter (Signed)
pt called and could not make todays appt due to transportation...per Dr. Gaylyn Rong put pt on [facility] schedule

## 2012-10-14 ENCOUNTER — Ambulatory Visit (HOSPITAL_BASED_OUTPATIENT_CLINIC_OR_DEPARTMENT_OTHER): Payer: Medicaid Other | Admitting: Oncology

## 2012-10-14 ENCOUNTER — Other Ambulatory Visit (HOSPITAL_BASED_OUTPATIENT_CLINIC_OR_DEPARTMENT_OTHER): Payer: Medicaid Other

## 2012-10-14 ENCOUNTER — Encounter: Payer: Self-pay | Admitting: Oncology

## 2012-10-14 ENCOUNTER — Telehealth: Payer: Self-pay | Admitting: Oncology

## 2012-10-14 VITALS — BP 127/61 | HR 107 | Temp 97.6°F | Resp 20 | Ht 74.0 in | Wt 200.0 lb

## 2012-10-14 DIAGNOSIS — F17200 Nicotine dependence, unspecified, uncomplicated: Secondary | ICD-10-CM

## 2012-10-14 DIAGNOSIS — I82403 Acute embolism and thrombosis of unspecified deep veins of lower extremity, bilateral: Secondary | ICD-10-CM

## 2012-10-14 DIAGNOSIS — F1010 Alcohol abuse, uncomplicated: Secondary | ICD-10-CM

## 2012-10-14 DIAGNOSIS — I82409 Acute embolism and thrombosis of unspecified deep veins of unspecified lower extremity: Secondary | ICD-10-CM

## 2012-10-14 DIAGNOSIS — Z86711 Personal history of pulmonary embolism: Secondary | ICD-10-CM

## 2012-10-14 DIAGNOSIS — D6862 Lupus anticoagulant syndrome: Secondary | ICD-10-CM

## 2012-10-14 LAB — PROTIME-INR
INR: 2.7 (ref 2.00–3.50)
Protime: 32.4 Seconds — ABNORMAL HIGH (ref 10.6–13.4)

## 2012-10-14 NOTE — Progress Notes (Signed)
[facility]  [phone] [phone]   OFFICE PROGRESS NOTE   Cc:  Default, Provider, MD  DIAGNOSIS: DVT of the Left lower extremity. Patient is lupus anticoagulant positive. Patient has a history of pulmonary embolism in October 2011.  PAST THERAPY: Patient received Coumadin in October 2011 for a PE, but stopped this in December 2012. S/P IVC filter placement in October 2011.  CURRENT THERAPY: Coumadin 20 mg daily. Goal INR is 2-3. Patient states he has been out of this medication for 1 week.  INTERVAL HISTORY: [name] 47 y.o. male returns for routine follow-up with his brother in law. He has missed multiple appointments with our office. He has been in the ER earlier this week due to leg swelling. Thought to be due to cellulitis as Doppler was negative. He was given Clindamycin, but states he has not filled this yet. Continues to have painful swelling to the left lower extremity. Denies chest pain. Reports dyspnea on exertion. Denies any bleeding. Continues to smoke 2-3 ppd of cigarettes. Reports that he continues to drink; he could not quantify, but states that he drinks enough to make him pass out.  Past Medical History  Diagnosis Date  . Coronary artery disease   . Degenerative joint disease of spine   . Pulmonary embolism 06/2010  . Degenerative joint disease   . 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 ETOH abuse   . DVT (deep venous thrombosis)   . Hepatitis B     Past Surgical History  Procedure Date  . Left elbow surgery   . Tonsillectomy     Current Outpatient Prescriptions  Medication Sig Dispense Refill  . acetaminophen (TYLENOL) 325 MG tablet Take 650 mg by mouth every 6 (six) hours as needed. Pain.      . Aspirin-Salicylamide-Caffeine (BC HEADACHE POWDER PO) Take 1 packet by mouth daily as needed. Pain.      . clindamycin (CLEOCIN) 150 MG capsule Take 1 capsule (150 mg total) by mouth every 6 (six) hours.  28 capsule  0   . Multiple Vitamin (MULTIVITAMIN WITH MINERALS) TABS Take 1 tablet by mouth daily.      Marland Kitchen warfarin (COUMADIN) 10 MG tablet Take 20 mg by mouth daily.        ALLERGIES:   has no known allergies.  REVIEW OF SYSTEMS:  The rest of the 14-point review of system was negative.   Filed Vitals:   10/14/12 1012  BP: 127/61  Pulse: 107  Temp: 97.6 F (36.4 C)  Resp: 20   Wt Readings from Last 3 Encounters:  10/14/12 200 lb (90.719 kg)  05/21/12 178 lb 2.1 oz (80.8 kg)  04/12/12 198 lb (89.812 kg)   ECOG Performance status: 1  PHYSICAL EXAMINATION: General:  Disheveled male no acute distress.  Patient smells of alcohol. Eyes:  no scleral icterus.  ENT:  There were no oropharyngeal lesions.  Neck was without thyromegaly.  Lymphatics:  Negative cervical, supraclavicular or axillary adenopathy.  Respiratory: lungs were clear bilaterally without wheezing or crackles.  Cardiovascular:  Regular rate and rhythm, S1/S2, without murmur, rub or gallop.  There was no pedal edema to the right leg. Left lower extremity with 2+ edema. Evette Cristal is red. No streaking.  GI:  abdomen was soft, flat, nontender, nondistended, without organomegaly.  Muscoloskeletal:  no spinal tenderness of palpation of vertebral spine.  Skin exam was without echymosis, petichae.  Neuro exam was nonfocal.  Patient was able to get on and off  exam table without assistance.  Gait was normal.  Patient was alerted and oriented.  Attention was good.   Language was appropriate.  Mood was normal without depression.  Speech was slurred.    LABORATORY/RADIOLOGY DATA:  Lab Results  Component Value Date   WBC 6.0 10/10/2012   HGB 12.1* 10/10/2012   HCT 36.6* 10/10/2012   PLT 289 10/10/2012   GLUCOSE 86 10/10/2012   CHOL 195 03/02/2011   TRIG 147 03/02/2011   HDL 96 03/02/2011   LDLCALC 70 03/02/2011   ALKPHOS 88 09/29/2012   ALT 29 09/29/2012   AST 54* 09/29/2012   NA 142 10/10/2012   K 3.8 10/10/2012   CL 107 10/10/2012   CREATININE 0.58 10/10/2012   BUN 6  10/10/2012   CO2 24 10/10/2012   INR 2.70 10/14/2012   HGBA1C 5.3 03/02/2011    ASSESSMENT AND PLAN:   1. Left lower extremity DVT. Patient positive for antiphospholipid antibody. Patient has most recently been on Coumadin. He has been noncompliant with lab and provider visits. INR has been supratherapeutic on multiple occasions when he has been in the ER>Plan is for indefinite anticoagulation. Plan is to switch the patient to Xarelto 20 mg daily. INR today is still therapeutic at 2.7 despite being off Coumadin for 1 week. Discussed with patient that we cannot start Xarelto until INR is <2.0 due to risk of bleeding. The patient was unhappy with this and argued that he must start today because of his blood clot. I have attempted to reassure the patient that due to the fact that his INR remains therapeutic the risk of bleeding with the addition or Xarelto is higher than a complication from the clot at this time. Plan his to have him come back to recheck INR next week and if <2.0 will being Xarelto. Samples will be provided to him at that time. I have referred him to our financial counselor today to begin to complete paperwork to assist [place] with getting him more Xarelto in the future. 2. 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  3. Alcohol abuse. He has been set-up with [facility] in the past and has not gone. He continues to drink heavily. He is not interested in stopping at this time. 4. Tobacco dependence. Continues to smoke 2-3 ppd. He has no interest in quitting at this time. 5. Social issues. The patient is homeless and has been seen by SW in the past. I have referred him to the financial counselor for assistance with Xarelto and to being the process to apply for Medicaid. I was notified that after the appointment that the patient did not go for financial counseling as arranged. 6. Follow-up. 1 week for INR and visit in 5 weeks.     The length of time of the face-to-face encounter was 60 minutes. More  than 50% of time was spent counseling and coordination of care.

## 2012-10-15 ENCOUNTER — Emergency Department (HOSPITAL_COMMUNITY)
Admission: EM | Admit: 2012-10-15 | Discharge: 2012-10-16 | Disposition: A | Discharge status: Home / Self Care | Payer: Medicaid Other | Attending: Emergency Medicine | Admitting: Emergency Medicine

## 2012-10-15 ENCOUNTER — Encounter (HOSPITAL_COMMUNITY): Payer: Self-pay | Admitting: Emergency Medicine

## 2012-10-15 ENCOUNTER — Emergency Department (HOSPITAL_COMMUNITY): Payer: Medicaid Other

## 2012-10-15 DIAGNOSIS — Z8619 Personal history of other infectious and parasitic diseases: Secondary | ICD-10-CM | POA: Insufficient documentation

## 2012-10-15 DIAGNOSIS — M7989 Other specified soft tissue disorders: Secondary | ICD-10-CM | POA: Insufficient documentation

## 2012-10-15 DIAGNOSIS — F1010 Alcohol abuse, uncomplicated: Secondary | ICD-10-CM | POA: Insufficient documentation

## 2012-10-15 DIAGNOSIS — M1990 Unspecified osteoarthritis, unspecified site: Secondary | ICD-10-CM | POA: Insufficient documentation

## 2012-10-15 DIAGNOSIS — I2510 Atherosclerotic heart disease of native coronary artery without angina pectoris: Secondary | ICD-10-CM | POA: Insufficient documentation

## 2012-10-15 DIAGNOSIS — R6883 Chills (without fever): Secondary | ICD-10-CM | POA: Insufficient documentation

## 2012-10-15 DIAGNOSIS — I82509 Chronic embolism and thrombosis of unspecified deep veins of unspecified lower extremity: Secondary | ICD-10-CM | POA: Insufficient documentation

## 2012-10-15 DIAGNOSIS — R05 Cough: Secondary | ICD-10-CM | POA: Insufficient documentation

## 2012-10-15 DIAGNOSIS — Z7982 Long term (current) use of aspirin: Secondary | ICD-10-CM | POA: Insufficient documentation

## 2012-10-15 DIAGNOSIS — Z91199 Patient's noncompliance with other medical treatment and regimen due to unspecified reason: Secondary | ICD-10-CM | POA: Insufficient documentation

## 2012-10-15 DIAGNOSIS — M25579 Pain in unspecified ankle and joints of unspecified foot: Secondary | ICD-10-CM | POA: Insufficient documentation

## 2012-10-15 DIAGNOSIS — F17200 Nicotine dependence, unspecified, uncomplicated: Secondary | ICD-10-CM | POA: Insufficient documentation

## 2012-10-15 DIAGNOSIS — Z7901 Long term (current) use of anticoagulants: Secondary | ICD-10-CM | POA: Insufficient documentation

## 2012-10-15 DIAGNOSIS — M479 Spondylosis, unspecified: Secondary | ICD-10-CM | POA: Insufficient documentation

## 2012-10-15 DIAGNOSIS — Z5900 Homelessness unspecified: Secondary | ICD-10-CM | POA: Insufficient documentation

## 2012-10-15 DIAGNOSIS — M25559 Pain in unspecified hip: Secondary | ICD-10-CM | POA: Insufficient documentation

## 2012-10-15 DIAGNOSIS — Z86711 Personal history of pulmonary embolism: Secondary | ICD-10-CM | POA: Insufficient documentation

## 2012-10-15 DIAGNOSIS — R059 Cough, unspecified: Secondary | ICD-10-CM | POA: Insufficient documentation

## 2012-10-15 DIAGNOSIS — Z9119 Patient's noncompliance with other medical treatment and regimen: Secondary | ICD-10-CM | POA: Insufficient documentation

## 2012-10-15 DIAGNOSIS — Z862 Personal history of diseases of the blood and blood-forming organs and certain disorders involving the immune mechanism: Secondary | ICD-10-CM | POA: Insufficient documentation

## 2012-10-15 DIAGNOSIS — G8929 Other chronic pain: Secondary | ICD-10-CM | POA: Insufficient documentation

## 2012-10-15 LAB — PROTIME-INR
INR: 2.09 — ABNORMAL HIGH (ref 0.00–1.49)
Prothrombin Time: 22.6 seconds — ABNORMAL HIGH (ref 11.6–15.2)

## 2012-10-15 LAB — POCT I-STAT TROPONIN I: Troponin i, poc: 0.01 ng/mL (ref 0.00–0.08)

## 2012-10-15 LAB — BASIC METABOLIC PANEL
Calcium: 8 mg/dL — ABNORMAL LOW (ref 8.4–10.5)
GFR calc Af Amer: >90 mL/min (ref >90)
GFR calc non Af Amer: >90 mL/min (ref >90)
Glucose, Bld: 83 mg/dL (ref 70–99)
Potassium: 3.7 mEq/L (ref 3.5–5.1)
Sodium: 139 mEq/L (ref 135–145)

## 2012-10-15 LAB — POCT I-STAT, CHEM 8
Calcium, Ion: 1.02 mmol/L — ABNORMAL LOW (ref 1.12–1.23)
Glucose, Bld: 84 mg/dL (ref 70–99)
HCT: 37 % — ABNORMAL LOW (ref 39.0–52.0)
Hemoglobin: 12.6 g/dL — ABNORMAL LOW (ref 13.0–17.0)
TCO2: 21 mmol/L (ref 0–100)

## 2012-10-15 LAB — CBC
Hemoglobin: 12 g/dL — ABNORMAL LOW (ref 13.0–17.0)
MCHC: 33.2 g/dL (ref 30.0–36.0)
Platelets: 217 10*3/uL (ref 150–400)
RDW: 17.8 % — ABNORMAL HIGH (ref 11.5–15.5)

## 2012-10-15 LAB — ETHANOL: Alcohol, Ethyl (B): 289 mg/dL — ABNORMAL HIGH (ref 0–11)

## 2012-10-15 MED ORDER — SODIUM CHLORIDE 0.9 % IV SOLN
Freq: Once | INTRAVENOUS | Status: AC
  Administered 2012-10-15: 50 mL/h via INTRAVENOUS

## 2012-10-15 NOTE — ED Notes (Signed)
[id number]<QM> Expected date:<BR> Expected time:<BR> Means of arrival:<BR> Comments:<BR> Medic

## 2012-10-15 NOTE — ED Notes (Signed)
Per EMS, pt. Was picked up at [facility] [address] who called for intermittent chest pain, right hip pain and right ankle pain at 8/10. Pt. Also noted for ETOH . Alert and oriented x4. Homeless. No s/s of SOB.

## 2012-10-15 NOTE — ED Provider Notes (Signed)
History     CSN: [id number]  Arrival date & time 10/15/12  2056   First MD Initiated Contact with Patient 10/15/12 2111      Chief Complaint  Patient presents with  . Chest Pain  . right hip pain   . right ankle pain   . Alcohol Intoxication    (Consider location/radiation/quality/duration/timing/severity/associated sxs/prior treatment) HPI Comments: Patient with chronic bilateral DVT's has been on Coumadin until the beginning of Feb  INR was checked 2/7 and still therapeutic the paln was the switch him to [place] when inr was normal He has Hx of noncompliance with appointments due to homelessness and frequent stays in jail.  Now patient states leg more swollen and painful, he has a cough and his urine has been dark in color   Patient is a 47 y.o. male presenting with chest pain and intoxication. The history is provided by the patient.  Chest Pain Pain location:  Unable to specify Pain quality: dull   Pain radiates to:  Does not radiate Pain radiates to the back: no   Pain severity:  Mild Onset quality:  Unable to specify Timing:  Constant Chronicity:  Chronic Relieved by:  Nothing Associated symptoms: cough   Associated symptoms: no dizziness, no fever, no nausea, no numbness, no shortness of breath, not vomiting and no weakness   Alcohol Intoxication Associated symptoms include chest pain, chills and coughing. Pertinent negatives include no fever, joint swelling, nausea, numbness, rash, vomiting or weakness.    Past Medical History  Diagnosis Date  . Coronary artery disease   . Degenerative joint disease of spine   . Pulmonary embolism 06/2010  . Degenerative joint disease   . 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 ETOH abuse   . DVT (deep venous thrombosis)   . Hepatitis B     Past Surgical History  Procedure Laterality Date  . Left elbow surgery    . Tonsillectomy      History reviewed. No pertinent family history.  History  Substance Use Topics  .  Smoking status: Current Every Day Smoker -- 3.00 packs/day for 30 years    Types: Cigarettes  . Smokeless tobacco: Never Used  . Alcohol Use: Yes     Comment: heavy drinker daily      Review of Systems  Constitutional: Positive for chills. Negative for fever.  Respiratory: Positive for cough. Negative for shortness of breath and wheezing.   Cardiovascular: Positive for chest pain and leg swelling.  Gastrointestinal: Negative for nausea and vomiting.  Genitourinary: Positive for hematuria. Negative for dysuria.  Musculoskeletal: Negative for joint swelling.  Skin: Negative for rash and wound.  Neurological: Negative for dizziness, weakness and numbness.  Psychiatric/Behavioral: The patient is nervous/anxious.     Allergies  Review of patient's allergies indicates no known allergies.  Home Medications   Current Outpatient Rx  Name  Route  Sig  Dispense  Refill  . Aspirin-Salicylamide-Caffeine (BC HEADACHE POWDER PO)   Oral   Take 1 packet by mouth 2 (two) times daily as needed (for pain). Pain.         . Multiple Vitamin (MULTIVITAMIN WITH MINERALS) TABS   Oral   Take 1 tablet by mouth every morning.          . clindamycin (CLEOCIN) 150 MG capsule   Oral   Take 150 mg by mouth every 6 (six) hours.         Marland Kitchen warfarin (COUMADIN) 10 MG tablet   Oral  Take 20 mg by mouth every morning.            BP 105/48  Pulse 98  Temp(Src) 98.3 F (36.8 C) (Oral)  Resp 16  SpO2 95%  Physical Exam  ED Course  Procedures (including critical care time)  Labs Reviewed  CBC - Abnormal; Notable for the following:    RBC 3.84 (*)    Hemoglobin 12.0 (*)    HCT 36.1 (*)    RDW 17.8 (*)    All other components within normal limits  BASIC METABOLIC PANEL - Abnormal; Notable for the following:    Calcium 8.0 (*)    All other components within normal limits  ETHANOL - Abnormal; Notable for the following:    Alcohol, Ethyl (B) 289 (*)    All other components within  normal limits  PROTIME-INR - Abnormal; Notable for the following:    Prothrombin Time 22.6 (*)    INR 2.09 (*)    All other components within normal limits  POCT I-STAT, CHEM 8 - Abnormal; Notable for the following:    Calcium, Ion 1.02 (*)    Hemoglobin 12.6 (*)    HCT 37.0 (*)    All other components within normal limits  POCT I-STAT TROPONIN I   Dg Chest 2 View  10/17/2012  *RADIOLOGY REPORT*  Clinical Data: Chest pain.  Left leg pain.  CHEST - 2 VIEW  Comparison: 10/15/2012 and multiple previous  Findings: Heart size is normal.  Mediastinal shadows are normal. The lungs are clear except for a 9 mm calcified granuloma in the superior segment of the left lower lobe.  No effusions.  No significant bony findings.  IMPRESSION: No active disease.  Left lower lobe granuloma.   Original Report Authenticated By: Paulina Fusi, M.D.    Dg Chest 2 View  10/15/2012  *RADIOLOGY REPORT*  Clinical Data: Cough and shortness of breath.  History of lupus and smoker.  CHEST - 2 VIEW  Comparison: 01/25/2012  Findings: Normal heart size and pulmonary vascularity. Emphysematous changes and scattered fibrosis in the lungs. Calcified granuloma in the left midlung is stable.  No focal consolidation or airspace disease.  No blunting of costophrenic angles.  No pneumothorax.  No significant change since previous study.  IMPRESSION: Emphysematous changes with fibrosis and calcified granuloma.  No evidence of active pulmonary disease.  No significant change.   Original Report Authenticated By: Burman Nieves, M.D.      1. Cough   2. Anticoagulated on Coumadin       MDM           Arman Filter, NP 10/17/12 2020

## 2012-10-16 NOTE — ED Provider Notes (Signed)
History     CSN: [id number]  Arrival date & time 10/15/12  2056   First MD Initiated Contact with Patient 10/15/12 2111      Chief Complaint  Patient presents with  . Chest Pain  . right hip pain   . right ankle pain   . Alcohol Intoxication    (Consider location/radiation/quality/duration/timing/severity/associated sxs/prior treatment) HPI  Past Medical History  Diagnosis Date  . Coronary artery disease   . Degenerative joint disease of spine   . Pulmonary embolism 06/2010  . Degenerative joint disease   . 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 ETOH abuse   . DVT (deep venous thrombosis)   . Hepatitis B     Past Surgical History  Procedure Laterality Date  . Left elbow surgery    . Tonsillectomy      History reviewed. No pertinent family history.  History  Substance Use Topics  . Smoking status: Current Every Day Smoker -- 3.00 packs/day for 30 years    Types: Cigarettes  . Smokeless tobacco: Never Used  . Alcohol Use: Yes     Comment: heavy drinker daily      Review of Systems  Allergies  Review of patient's allergies indicates no known allergies.  Home Medications   Current Outpatient Rx  Name  Route  Sig  Dispense  Refill  . Aspirin-Salicylamide-Caffeine (BC HEADACHE POWDER PO)   Oral   Take 1 packet by mouth 2 (two) times daily as needed (for pain). Pain.         . Multiple Vitamin (MULTIVITAMIN WITH MINERALS) TABS   Oral   Take 1 tablet by mouth every morning.          . clindamycin (CLEOCIN) 150 MG capsule   Oral   Take 150 mg by mouth every 6 (six) hours.         Marland Kitchen warfarin (COUMADIN) 10 MG tablet   Oral   Take 20 mg by mouth every morning.            BP 103/57  Pulse 105  Temp(Src) 98 F (36.7 C) (Oral)  Resp 16  SpO2 97%  Physical Exam  ED Course  Procedures (including critical care time)  Labs Reviewed  CBC - Abnormal; Notable for the following:    RBC 3.84 (*)    Hemoglobin 12.0 (*)    HCT 36.1 (*)    RDW 17.8 (*)    All other components within normal limits  BASIC METABOLIC PANEL - Abnormal; Notable for the following:    Calcium 8.0 (*)    All other components within normal limits  ETHANOL - Abnormal; Notable for the following:    Alcohol, Ethyl (B) 289 (*)    All other components within normal limits  PROTIME-INR - Abnormal; Notable for the following:    Prothrombin Time 22.6 (*)    INR 2.09 (*)    All other components within normal limits  POCT I-STAT, CHEM 8 - Abnormal; Notable for the following:    Calcium, Ion 1.02 (*)    Hemoglobin 12.6 (*)    HCT 37.0 (*)    All other components within normal limits  URINALYSIS, ROUTINE W REFLEX MICROSCOPIC  POCT I-STAT TROPONIN I   Dg Chest 2 View  10/15/2012  *RADIOLOGY REPORT*  Clinical Data: Cough and shortness of breath.  History of lupus and smoker.  CHEST - 2 VIEW  Comparison: 01/25/2012  Findings: Normal heart size and pulmonary vascularity. Emphysematous changes and scattered fibrosis  in the lungs. Calcified granuloma in the left midlung is stable.  No focal consolidation or airspace disease.  No blunting of costophrenic angles.  No pneumothorax.  No significant change since previous study.  IMPRESSION: Emphysematous changes with fibrosis and calcified granuloma.  No evidence of active pulmonary disease.  No significant change.   Original Report Authenticated By: Burman Nieves, M.D.      1. Cough   2. Anticoagulated on Coumadin       MDM  Patient ghas been allowed to sleep in the ED for the night due to homeless and outside temperatures         Arman Filter, NP 10/16/12 [phone]

## 2012-10-16 NOTE — ED Provider Notes (Signed)
History     CSN: [id number]  Arrival date & time 10/15/12  2056   First MD Initiated Contact with Patient 10/15/12 2111      Chief Complaint  Patient presents with  . Chest Pain  . right hip pain   . right ankle pain   . Alcohol Intoxication    (Consider location/radiation/quality/duration/timing/severity/associated sxs/prior treatment) HPI Comments: Patient with vague CP cough non productive and worsening L leg swelling Has IVC flilter is currently off coumadin waiting for iNR to normalize to  +be able to switch to [facility] Has INR checked 2/7 it was 2.7 patient was to return in 2 weeks for recheck   Patient is a 47 y.o. male presenting with chest pain and intoxication. The history is provided by the patient.  Chest Pain Pain location:  Unable to specify Pain quality: dull   Pain radiates to:  Does not radiate Pain radiates to the back: no   Pain severity:  No pain Onset quality:  Unable to specify Relieved by:  Nothing Associated symptoms: cough   Associated symptoms: no diaphoresis, no dizziness, no fever, no nausea, no shortness of breath, not vomiting and no weakness   Alcohol Intoxication Associated symptoms include chest pain and coughing. Pertinent negatives include no diaphoresis, fever, nausea, rash, vomiting or weakness.    Past Medical History  Diagnosis Date  . Coronary artery disease   . Degenerative joint disease of spine   . Pulmonary embolism 06/2010  . Degenerative joint disease   . 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 ETOH abuse   . DVT (deep venous thrombosis)   . Hepatitis B     Past Surgical History  Procedure Laterality Date  . Left elbow surgery    . Tonsillectomy      History reviewed. No pertinent family history.  History  Substance Use Topics  . Smoking status: Current Every Day Smoker -- 3.00 packs/day for 30 years    Types: Cigarettes  . Smokeless tobacco: Never Used  . Alcohol Use: Yes     Comment: heavy drinker daily       Review of Systems  Constitutional: Negative for fever and diaphoresis.  Respiratory: Positive for cough. Negative for shortness of breath.   Cardiovascular: Positive for chest pain and leg swelling.  Gastrointestinal: Negative for nausea and vomiting.  Skin: Negative for rash.  Neurological: Negative for dizziness and weakness.    Allergies  Review of patient's allergies indicates no known allergies.  Home Medications   Current Outpatient Rx  Name  Route  Sig  Dispense  Refill  . Aspirin-Salicylamide-Caffeine (BC HEADACHE POWDER PO)   Oral   Take 1 packet by mouth 2 (two) times daily as needed (for pain). Pain.         . Multiple Vitamin (MULTIVITAMIN WITH MINERALS) TABS   Oral   Take 1 tablet by mouth every morning.          . clindamycin (CLEOCIN) 150 MG capsule   Oral   Take 150 mg by mouth every 6 (six) hours.         Marland Kitchen warfarin (COUMADIN) 10 MG tablet   Oral   Take 20 mg by mouth every morning.            BP 103/57  Pulse 105  Temp(Src) 98 F (36.7 C) (Oral)  Resp 16  SpO2 97%  Physical Exam  Constitutional: He is oriented to person, place, and time. He appears well-developed and well-nourished.  intoxicated  HENT:  Head: Normocephalic.  Eyes: Pupils are equal, round, and reactive to light.  Neck: Normal range of motion.  Cardiovascular: Normal rate and regular rhythm.   Pulmonary/Chest: Effort normal.  Abdominal: Soft. Bowel sounds are normal.  Musculoskeletal: He exhibits edema. He exhibits no tenderness.  Neurological: He is alert and oriented to person, place, and time.  Skin: Skin is warm.    ED Course  Procedures (including critical care time)  Labs Reviewed  CBC - Abnormal; Notable for the following:    RBC 3.84 (*)    Hemoglobin 12.0 (*)    HCT 36.1 (*)    RDW 17.8 (*)    All other components within normal limits  BASIC METABOLIC PANEL - Abnormal; Notable for the following:    Calcium 8.0 (*)    All other components  within normal limits  ETHANOL - Abnormal; Notable for the following:    Alcohol, Ethyl (B) 289 (*)    All other components within normal limits  PROTIME-INR - Abnormal; Notable for the following:    Prothrombin Time 22.6 (*)    INR 2.09 (*)    All other components within normal limits  POCT I-STAT, CHEM 8 - Abnormal; Notable for the following:    Calcium, Ion 1.02 (*)    Hemoglobin 12.6 (*)    HCT 37.0 (*)    All other components within normal limits  URINALYSIS, ROUTINE W REFLEX MICROSCOPIC  POCT I-STAT TROPONIN I   Dg Chest 2 View  10/15/2012  *RADIOLOGY REPORT*  Clinical Data: Cough and shortness of breath.  History of lupus and smoker.  CHEST - 2 VIEW  Comparison: 01/25/2012  Findings: Normal heart size and pulmonary vascularity. Emphysematous changes and scattered fibrosis in the lungs. Calcified granuloma in the left midlung is stable.  No focal consolidation or airspace disease.  No blunting of costophrenic angles.  No pneumothorax.  No significant change since previous study.  IMPRESSION: Emphysematous changes with fibrosis and calcified granuloma.  No evidence of active pulmonary disease.  No significant change.   Original Report Authenticated By: Burman Nieves, M.D.      1. Cough   2. Anticoagulated on Coumadin       MDM   L leg swollen chronically + pulses  INR today 2.0  Will encourage patient FU as schedule in 1 week with PCP for a recheck         Arman Filter, NP 10/16/12 [phone]

## 2012-10-17 ENCOUNTER — Encounter (HOSPITAL_COMMUNITY): Payer: Self-pay | Admitting: Emergency Medicine

## 2012-10-17 ENCOUNTER — Emergency Department (HOSPITAL_COMMUNITY): Payer: Medicaid Other

## 2012-10-17 ENCOUNTER — Other Ambulatory Visit: Payer: Self-pay

## 2012-10-17 ENCOUNTER — Emergency Department (HOSPITAL_COMMUNITY)
Admission: EM | Admit: 2012-10-17 | Discharge: 2012-10-17 | Disposition: A | Discharge status: Home / Self Care | Payer: Medicaid Other | Attending: Emergency Medicine | Admitting: Emergency Medicine

## 2012-10-17 DIAGNOSIS — R0602 Shortness of breath: Secondary | ICD-10-CM | POA: Insufficient documentation

## 2012-10-17 DIAGNOSIS — Z8619 Personal history of other infectious and parasitic diseases: Secondary | ICD-10-CM | POA: Insufficient documentation

## 2012-10-17 DIAGNOSIS — R791 Abnormal coagulation profile: Secondary | ICD-10-CM | POA: Insufficient documentation

## 2012-10-17 DIAGNOSIS — D6859 Other primary thrombophilia: Secondary | ICD-10-CM | POA: Insufficient documentation

## 2012-10-17 DIAGNOSIS — Z5900 Homelessness unspecified: Secondary | ICD-10-CM | POA: Insufficient documentation

## 2012-10-17 DIAGNOSIS — Z8739 Personal history of other diseases of the musculoskeletal system and connective tissue: Secondary | ICD-10-CM | POA: Insufficient documentation

## 2012-10-17 DIAGNOSIS — Z7982 Long term (current) use of aspirin: Secondary | ICD-10-CM | POA: Insufficient documentation

## 2012-10-17 DIAGNOSIS — M7989 Other specified soft tissue disorders: Secondary | ICD-10-CM | POA: Insufficient documentation

## 2012-10-17 DIAGNOSIS — I2699 Other pulmonary embolism without acute cor pulmonale: Secondary | ICD-10-CM | POA: Insufficient documentation

## 2012-10-17 DIAGNOSIS — M79609 Pain in unspecified limb: Secondary | ICD-10-CM | POA: Insufficient documentation

## 2012-10-17 DIAGNOSIS — I2510 Atherosclerotic heart disease of native coronary artery without angina pectoris: Secondary | ICD-10-CM | POA: Insufficient documentation

## 2012-10-17 DIAGNOSIS — Z79899 Other long term (current) drug therapy: Secondary | ICD-10-CM | POA: Insufficient documentation

## 2012-10-17 DIAGNOSIS — Z7901 Long term (current) use of anticoagulants: Secondary | ICD-10-CM | POA: Insufficient documentation

## 2012-10-17 DIAGNOSIS — R42 Dizziness and giddiness: Secondary | ICD-10-CM | POA: Insufficient documentation

## 2012-10-17 DIAGNOSIS — F1010 Alcohol abuse, uncomplicated: Secondary | ICD-10-CM | POA: Insufficient documentation

## 2012-10-17 DIAGNOSIS — Z86718 Personal history of other venous thrombosis and embolism: Secondary | ICD-10-CM | POA: Insufficient documentation

## 2012-10-17 DIAGNOSIS — R079 Chest pain, unspecified: Secondary | ICD-10-CM | POA: Insufficient documentation

## 2012-10-17 DIAGNOSIS — F17200 Nicotine dependence, unspecified, uncomplicated: Secondary | ICD-10-CM | POA: Insufficient documentation

## 2012-10-17 DIAGNOSIS — M79606 Pain in leg, unspecified: Secondary | ICD-10-CM

## 2012-10-17 LAB — CBC WITH DIFFERENTIAL/PLATELET
Basophils Absolute: 0.1 10*3/uL (ref 0.0–0.1)
Basophils Relative: 1 % (ref 0–1)
Eosinophils Absolute: 0.1 10*3/uL (ref 0.0–0.7)
MCH: 31.5 pg (ref 26.0–34.0)
MCHC: 33.5 g/dL (ref 30.0–36.0)
Neutro Abs: 2.9 10*3/uL (ref 1.7–7.7)
Neutrophils Relative %: 55 % (ref 43–77)
Platelets: 184 10*3/uL (ref 150–400)
RDW: 17.9 % — ABNORMAL HIGH (ref 11.5–15.5)

## 2012-10-17 LAB — POCT I-STAT TROPONIN I: Troponin i, poc: 0.01 ng/mL (ref 0.00–0.08)

## 2012-10-17 LAB — BASIC METABOLIC PANEL
Chloride: 107 mEq/L (ref 96–112)
GFR calc Af Amer: >90 mL/min (ref >90)
GFR calc non Af Amer: >90 mL/min (ref >90)
Potassium: 3.9 mEq/L (ref 3.5–5.1)

## 2012-10-17 LAB — PROTIME-INR: Prothrombin Time: 15.1 seconds (ref 11.6–15.2)

## 2012-10-17 MED ORDER — WARFARIN SODIUM 5 MG PO TABS: 15.0000 mg | ORAL_TABLET | Freq: Every day | ORAL | Status: DC

## 2012-10-17 MED ORDER — WARFARIN SODIUM 7.5 MG PO TABS
15.0000 mg | ORAL_TABLET | Freq: Once | ORAL | Status: AC
  Administered 2012-10-17: 15 mg via ORAL
  Filled 2012-10-17: qty 2

## 2012-10-17 MED ORDER — WARFARIN SODIUM 10 MG PO TABS: 20.0000 mg | ORAL_TABLET | Freq: Once | ORAL | Status: DC

## 2012-10-17 MED ORDER — KETOROLAC TROMETHAMINE 30 MG/ML IJ SOLN
60.0000 mg | Freq: Once | INTRAMUSCULAR | Status: AC
  Administered 2012-10-17: 60 mg via INTRAMUSCULAR
  Filled 2012-10-17: qty 2

## 2012-10-17 MED ORDER — WARFARIN - PHYSICIAN DOSING INPATIENT: Freq: Every day | Status: DC

## 2012-10-17 NOTE — ED Notes (Signed)
Pt c/o left leg pain from what he thinks is DVT that he says he has hx of and is not taking his coumadin; pt sts CP and dizziness also; pt smells of ETOH and is uncooperative with staff; [employer] at beside as EKG obtained; pt appears intoxicated and admits to ETOH

## 2012-10-17 NOTE — ED Notes (Signed)
Pt laying on floor in waiting room cussing.  This RN over to help pt to w/c and pt loudly cussing at this RN.  Pt requesting to be arrested.  [employer] over to see pt with this RN.  Pt put self back in w/c and sitting in chair at this time.  Pt in NAD, respirations even and unlabored.

## 2012-10-17 NOTE — ED Notes (Addendum)
Pt st's he ran out of his coumadin 2 weeks ago and now has a DVT to left lower leg.  Also st's he has had chest pain for 2 weeks.

## 2012-10-17 NOTE — ED Provider Notes (Signed)
Medical screening examination/treatment/procedure(s) were performed by non-physician practitioner and as supervising physician I was immediately available for consultation/collaboration.   Richardean Canal, MD 10/17/12 1409

## 2012-10-17 NOTE — ED Provider Notes (Signed)
History     CSN: [id number]  Arrival date & time 10/17/12  4098   First MD Initiated Contact with Patient 10/17/12 2056      Chief Complaint  Patient presents with  . Chest Pain  . Leg Pain    (Consider location/radiation/quality/duration/timing/severity/associated sxs/prior treatment) HPI Comments: 47 y/o homeless male h/o DVT/PE, lupus p/w left leg pain. Patient states he ran out of coumadin 2 days ago. Has not taken since then. Now with worsening left leg pain and swelling. States its similar to prior DVT's. Also with chest pain past few days. Associated mild SOB. CP is non-exertional. Mildly worsened with breathing. Patient admits to etoh today. Patient is a 47 y.o. male presenting with leg pain. The history is provided by the patient.  Leg Pain Location:  Leg Leg location:  L leg Pain details:    Quality:  Aching   Radiates to:  Does not radiate   Severity:  Severe   Onset quality:  Gradual   Timing:  Constant   Progression:  Worsening Chronicity:  Chronic Foreign body present:  No foreign bodies Relieved by:  Nothing Worsened by:  Nothing tried Associated symptoms: swelling   Associated symptoms: no fever and no numbness     Past Medical History  Diagnosis Date  . Coronary artery disease   . Degenerative joint disease of spine   . Pulmonary embolism 06/2010  . Degenerative joint disease   . 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 ETOH abuse   . DVT (deep venous thrombosis)   . Hepatitis B     Past Surgical History  Procedure Laterality Date  . Left elbow surgery    . Tonsillectomy      History reviewed. No pertinent family history.  History  Substance Use Topics  . Smoking status: Current Every Day Smoker -- 3.00 packs/day for 30 years    Types: Cigarettes  . Smokeless tobacco: Never Used  . Alcohol Use: Yes     Comment: heavy drinker daily      Review of Systems  Constitutional: Negative for fever and chills.  HENT: Negative for congestion  and rhinorrhea.   Eyes: Negative for pain and visual disturbance.  Respiratory: Negative for cough and shortness of breath.   Cardiovascular: Positive for chest pain (occasional chest pain.) and leg swelling.  Gastrointestinal: Negative for nausea, vomiting, abdominal pain and diarrhea.  Genitourinary: Negative for flank pain and difficulty urinating.  Skin: Negative for color change and rash.  Neurological: Positive for dizziness (mild dizziness earlier. none currently). Negative for headaches.  All other systems reviewed and are negative.    Allergies  Review of patient's allergies indicates no known allergies.  Home Medications   Current Outpatient Rx  Name  Route  Sig  Dispense  Refill  . Aspirin-Salicylamide-Caffeine (BC HEADACHE POWDER PO)   Oral   Take 1 packet by mouth 2 (two) times daily as needed (for pain). Pain.         . Multiple Vitamin (MULTIVITAMIN WITH MINERALS) TABS   Oral   Take 1 tablet by mouth every morning.          . clindamycin (CLEOCIN) 150 MG capsule   Oral   Take 150 mg by mouth every 6 (six) hours.         Marland Kitchen warfarin (COUMADIN) 10 MG tablet   Oral   Take 20 mg by mouth every morning.          . warfarin (COUMADIN) 5 MG tablet  Oral   Take 3 tablets (15 mg total) by mouth daily.   30 tablet   0     BP 115/83  Pulse 92  Temp(Src) 97.7 F (36.5 C) (Oral)  Resp 20  SpO2 96%  Physical Exam  Nursing note and vitals reviewed. Constitutional: He appears well-developed. No distress.  dissheveled male. Smells of etoh.  HENT:  Head: Normocephalic and atraumatic.  Eyes: Conjunctivae and EOM are normal. Pupils are equal, round, and reactive to light. Right eye exhibits no discharge. Left eye exhibits no discharge.  Neck: Normal range of motion. Neck supple. No tracheal deviation present.  Cardiovascular: Normal rate, regular rhythm, normal heart sounds and intact distal pulses.   Pulmonary/Chest: Effort normal and breath sounds  normal. No stridor. No respiratory distress. He has no wheezes. He has no rales. He exhibits no tenderness.  Abdominal: Soft. He exhibits no distension. There is no tenderness. There is no guarding.  Musculoskeletal: He exhibits edema (left leg). He exhibits no tenderness.  Neurological: He is alert. He has normal strength. No cranial nerve deficit. GCS eye subscore is 4. GCS verbal subscore is 4. GCS motor subscore is 6.  Skin: Skin is warm and dry.    ED Course  Procedures (including critical care time)  Labs Reviewed  CBC WITH DIFFERENTIAL - Abnormal; Notable for the following:    RBC 4.00 (*)    Hemoglobin 12.6 (*)    HCT 37.6 (*)    RDW 17.9 (*)    All other components within normal limits  BASIC METABOLIC PANEL - Abnormal; Notable for the following:    BUN 5 (*)    All other components within normal limits  PROTIME-INR  PROTIME-INR  POCT I-STAT TROPONIN I   Dg Chest 2 View  10/17/2012  *RADIOLOGY REPORT*  Clinical Data: Chest pain.  Left leg pain.  CHEST - 2 VIEW  Comparison: 10/15/2012 and multiple previous  Findings: Heart size is normal.  Mediastinal shadows are normal. The lungs are clear except for a 9 mm calcified granuloma in the superior segment of the left lower lobe.  No effusions.  No significant bony findings.  IMPRESSION: No active disease.  Left lower lobe granuloma.   Original Report Authenticated By: Paulina Fusi, M.D.      1. Subtherapeutic international normalized ratio (INR)   2. Leg pain   3. Leg swelling      Date: 10/17/2012  Rate: 90  Rhythm: normal sinus rhythm  QRS Axis: normal  Intervals: normal  ST/T Wave abnormalities: normal  Conduction Disutrbances:none  Narrative Interpretation:   Old EKG Reviewed: unchanged    MDM    47 y/o male p/w left leg pain and chest pain.  Possible DVT/PE. On coumadin, given home dose and to f/u pcp. Patient with negative [place] study 1 week ago. Doubt large DVT or PE if even present. HDS. No O2 requirement.  Would not be candidate for any invasive alternative treatment even IF with PE. Cont treatment with home dose coumadin. NV intact BLE. Unlikely cellulitis. Not warm to touch. Not erythematous. Recently treated with abx. Unlikely PNA as CXR neg, no leukocytosis, no cough, no fever Unlikely ACS as troponin neg, EKG unremarkable,  risk Doubt Aortic Dissection, Pancreatitis, Arrhythmia, Pneumothorax, Endo/Myo/Pericarditis, Shingles, Emergent complications of an Ulcer, Esophageal pathology, or other emergent pathology. Patient complained of mild dizziness earlier. Not currently. Admits to etoh. Grossly neuro intact.  Dc home. Return precautions given. Follow up with primary care provider. Patient in agreement with  plan   Labs and imaging reviewed by myself and considered in medical decision making if ordered. Imaging interpreted by radiology.   Discussed case with Dr. Hyacinth Meeker who is in agreement with assessment and plan.          Stevie Kern, MD 10/17/12 [phone]

## 2012-10-18 NOTE — ED Provider Notes (Signed)
Pt out of coumadin, has asymetry to LE's but has recent US showing no acute DVT - subtherapeutic on coumadin but willing to take if had prexcription.  Denies CP or SOB and has no sig hypoxia or tachycardia - start coumadin again, given dose and d/c with Rx, stable for d/c.  On my exam has clear lungs and heart, asymetry with edema of the LLE.  I saw and evaluated the patient, reviewed the resident's note and I agree with the findings and plan.   Vida Roller, MD 10/18/12 [phone]

## 2012-10-18 NOTE — ED Provider Notes (Signed)
Medical screening examination/treatment/procedure(s) were performed by non-physician practitioner and as supervising physician I was immediately available for consultation/collaboration.   David H Yao, MD 10/18/12 1455 

## 2012-10-20 ENCOUNTER — Other Ambulatory Visit: Payer: Self-pay | Admitting: Lab

## 2012-10-22 NOTE — ED Provider Notes (Signed)
Medical screening examination/treatment/procedure(s) were performed by non-physician practitioner and as supervising physician I was immediately available for consultation/collaboration.  Jones Skene, M.D.     Jones Skene, MD 10/22/12 [phone]

## 2012-10-24 ENCOUNTER — Telehealth: Payer: Self-pay | Admitting: Oncology

## 2012-10-24 NOTE — Telephone Encounter (Signed)
lvm for pt regarding to new appt d/t.Marland KitchenMarland Kitchen

## 2012-10-27 ENCOUNTER — Other Ambulatory Visit: Payer: Self-pay | Admitting: Lab

## 2012-11-01 ENCOUNTER — Emergency Department (HOSPITAL_COMMUNITY)
Admission: EM | Admit: 2012-11-01 | Discharge: 2012-11-02 | Disposition: A | Discharge status: Rehabilitation Facility | Payer: No Typology Code available for payment source | Source: Home / Self Care | Attending: Emergency Medicine | Admitting: Emergency Medicine

## 2012-11-01 ENCOUNTER — Encounter (HOSPITAL_COMMUNITY): Payer: Self-pay | Admitting: Emergency Medicine

## 2012-11-01 DIAGNOSIS — Z86718 Personal history of other venous thrombosis and embolism: Secondary | ICD-10-CM | POA: Insufficient documentation

## 2012-11-01 DIAGNOSIS — I2510 Atherosclerotic heart disease of native coronary artery without angina pectoris: Secondary | ICD-10-CM | POA: Diagnosis present

## 2012-11-01 DIAGNOSIS — F10939 Alcohol use, unspecified with withdrawal, unspecified: Principal | ICD-10-CM | POA: Diagnosis present

## 2012-11-01 DIAGNOSIS — F1020 Alcohol dependence, uncomplicated: Secondary | ICD-10-CM | POA: Diagnosis present

## 2012-11-01 DIAGNOSIS — Z8739 Personal history of other diseases of the musculoskeletal system and connective tissue: Secondary | ICD-10-CM | POA: Insufficient documentation

## 2012-11-01 DIAGNOSIS — Z79899 Other long term (current) drug therapy: Secondary | ICD-10-CM

## 2012-11-01 DIAGNOSIS — R5381 Other malaise: Secondary | ICD-10-CM | POA: Insufficient documentation

## 2012-11-01 DIAGNOSIS — Z7901 Long term (current) use of anticoagulants: Secondary | ICD-10-CM | POA: Insufficient documentation

## 2012-11-01 DIAGNOSIS — F332 Major depressive disorder, recurrent severe without psychotic features: Secondary | ICD-10-CM | POA: Diagnosis present

## 2012-11-01 DIAGNOSIS — M79609 Pain in unspecified limb: Secondary | ICD-10-CM | POA: Insufficient documentation

## 2012-11-01 DIAGNOSIS — F17200 Nicotine dependence, unspecified, uncomplicated: Secondary | ICD-10-CM | POA: Insufficient documentation

## 2012-11-01 DIAGNOSIS — F1010 Alcohol abuse, uncomplicated: Secondary | ICD-10-CM | POA: Insufficient documentation

## 2012-11-01 DIAGNOSIS — Z8619 Personal history of other infectious and parasitic diseases: Secondary | ICD-10-CM | POA: Insufficient documentation

## 2012-11-01 DIAGNOSIS — Z86711 Personal history of pulmonary embolism: Secondary | ICD-10-CM | POA: Insufficient documentation

## 2012-11-01 LAB — CBC
Hemoglobin: 12.5 g/dL — ABNORMAL LOW (ref 13.0–17.0)
MCH: 31.9 pg (ref 26.0–34.0)
MCHC: 33.2 g/dL (ref 30.0–36.0)
Platelets: 209 10*3/uL (ref 150–400)

## 2012-11-01 LAB — COMPREHENSIVE METABOLIC PANEL
ALT: 36 U/L (ref 0–53)
AST: 51 U/L — ABNORMAL HIGH (ref 0–37)
Albumin: 3.1 g/dL — ABNORMAL LOW (ref 3.5–5.2)
Alkaline Phosphatase: 79 U/L (ref 39–117)
Calcium: 8.1 mg/dL — ABNORMAL LOW (ref 8.4–10.5)
Glucose, Bld: 69 mg/dL — ABNORMAL LOW (ref 70–99)
Potassium: 4 mEq/L (ref 3.5–5.1)
Sodium: 142 mEq/L (ref 135–145)
Total Protein: 6.7 g/dL (ref 6.0–8.3)

## 2012-11-01 MED ORDER — LORAZEPAM 1 MG PO TABS
1.0000 mg | ORAL_TABLET | Freq: Three times a day (TID) | ORAL | Status: DC | PRN
  Filled 2012-11-01: qty 1

## 2012-11-01 MED ORDER — SODIUM CHLORIDE 0.9 % IV BOLUS (SEPSIS): 500.0000 mL | INTRAVENOUS | Status: DC

## 2012-11-01 MED ORDER — ACETAMINOPHEN 325 MG PO TABS: 650.0000 mg | ORAL_TABLET | ORAL | Status: DC | PRN

## 2012-11-01 MED ORDER — AMMONIA AROMATIC IN INHA
RESPIRATORY_TRACT | Status: AC
  Filled 2012-11-01: qty 10

## 2012-11-01 NOTE — ED Notes (Signed)
Pt unresponsive to verbal questions.  Continuing to sleep.  Patient mumbling occasionally but difficult to understand.  Smell of ETOH.  Pt on BP and O2 monitor.

## 2012-11-01 NOTE — ED Provider Notes (Signed)
History     CSN: [id number]  Arrival date & time 11/01/12  1918   First MD Initiated Contact with Patient 11/01/12 2055      Chief Complaint  Patient presents with  . Alcohol Intoxication  . Leg Problem    (Consider location/radiation/quality/duration/timing/severity/associated sxs/prior treatment) HPI The patient presents to 2 an episode of weakness, and with concerns of increasing left lower extremity pain and swelling.  On my initial exam the patient is sleeping, but he awakened with ammonia salts.  He states that he is tired of having the left lower extremity pain, tired of having to obtain medication, tired of bouncing between hospitals, homelessness.  Although initially the patient only complains of pain, he should have that he is hopeless, wishes to die. He denies other physical pain.  He states that he recently began taking his Coumadin again after a period of noncompliance.  He denies drug use, states that he does drink alcohol. Past Medical History  Diagnosis Date  . Coronary artery disease   . Degenerative joint disease of spine   . Pulmonary embolism 06/2010  . Degenerative joint disease   . 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 ETOH abuse   . DVT (deep venous thrombosis)   . Hepatitis B     Past Surgical History  Procedure Laterality Date  . Left elbow surgery    . Tonsillectomy      No family history on file.  History  Substance Use Topics  . Smoking status: Current Every Day Smoker -- 3.00 packs/day for 30 years    Types: Cigarettes  . Smokeless tobacco: Never Used  . Alcohol Use: Yes     Comment: heavy drinker daily      Review of Systems  Constitutional:       Per HPI, otherwise negative  HENT:       Per HPI, otherwise negative  Respiratory:       Per HPI, otherwise negative  Cardiovascular:       Per HPI, otherwise negative  Gastrointestinal: Negative for nausea and vomiting.  Endocrine:       Negative aside from HPI  Genitourinary:        Neg aside from HPI   Musculoskeletal:       Per HPI, otherwise negative  Skin: Negative for wound.  Neurological: Positive for weakness. Negative for syncope.  Hematological:       DVT left leg    Allergies  Review of patient's allergies indicates no known allergies.  Home Medications   Current Outpatient Rx  Name  Route  Sig  Dispense  Refill  . Aspirin-Salicylamide-Caffeine (BC HEADACHE POWDER PO)   Oral   Take 1 packet by mouth 2 (two) times daily as needed (for pain). Pain.         . clindamycin (CLEOCIN) 150 MG capsule   Oral   Take 150 mg by mouth every 6 (six) hours.         . Multiple Vitamin (MULTIVITAMIN WITH MINERALS) TABS   Oral   Take 1 tablet by mouth every morning.          . warfarin (COUMADIN) 10 MG tablet   Oral   Take 20 mg by mouth every morning.          . warfarin (COUMADIN) 5 MG tablet   Oral   Take 3 tablets (15 mg total) by mouth daily.   30 tablet   0     BP 110/42  Pulse  94  Temp(Src) 97.6 F (36.4 C) (Oral)  Resp 20  SpO2 100%  Physical Exam  Nursing note and vitals reviewed. Constitutional:  Disheveled elderly appearing male  HENT:  Head: Normocephalic and atraumatic.  Eyes: Conjunctivae are normal. Right eye exhibits no discharge. Left eye exhibits no discharge.  Neck: No tracheal deviation present.  Cardiovascular: Normal rate and regular rhythm.   Pulmonary/Chest: Effort normal. No stridor. No respiratory distress.  Abdominal: He exhibits no distension.  Musculoskeletal:  Left lower extremity is grossly larger than the contralateral side, though the patient can move the knee, ankle appropriately.  The edema is nonpitting.  There is no superficial changes that are appreciable.  Skin: Skin is warm and dry.  Psychiatric: His affect is labile and inappropriate. His speech is delayed and tangential. He is agitated. Cognition and memory are impaired. He expresses impulsivity. He exhibits a depressed mood. He  expresses suicidal ideation. He expresses no suicidal plans.    ED Course  Procedures (including critical care time)  Labs Reviewed  PROTIME-INR - Abnormal; Notable for the following:    Prothrombin Time 28.1 (*)    INR 2.80 (*)    All other components within normal limits  CBC - Abnormal; Notable for the following:    RBC 3.92 (*)    Hemoglobin 12.5 (*)    HCT 37.6 (*)    RDW 18.4 (*)    All other components within normal limits  COMPREHENSIVE METABOLIC PANEL - Abnormal; Notable for the following:    Glucose, Bld 69 (*)    BUN 4 (*)    Calcium 8.1 (*)    Albumin 3.1 (*)    AST 51 (*)    Total Bilirubin 0.2 (*)    All other components within normal limits  ETHANOL - Abnormal; Notable for the following:    Alcohol, Ethyl (B) 263 (*)    All other components within normal limits  URINE RAPID DRUG SCREEN (HOSP PERFORMED)  URINALYSIS, ROUTINE W REFLEX MICROSCOPIC   No results found.   No diagnosis found.   Labs demonstrate the patient has therapeutic INR. MDM  This patient presents intoxicated with concern of left lower extremity pain, where the patient has a previously identified the DVT.  Absent respiratory complaints, hypoxia, tachypnea, there is low suspicion for systemic spread.  Patient is also therapeutic with his Coumadin. The patient endorses suicidal ideation, hopelessness following initial evaluation, and is medically clear for further evaluation and management by our behavioral health team, once he is clinically sober.   Gerhard Munch, MD 11/01/12 2256

## 2012-11-01 NOTE — ED Notes (Signed)
[id number]<AV> Expected date:<BR> Expected time:<BR> Means of arrival:<BR> Comments:<BR> Leg wkness, hx of blood clots, ETOH

## 2012-11-01 NOTE — ED Notes (Signed)
Pt arrives by [facility] for c/o "legs giving out." Pt has ETOH on board.

## 2012-11-01 NOTE — Progress Notes (Signed)
EDP discussed patient with CSW. EDP recommended assessing once patient is more sober.   Doree Albee  [phone] 11/01/2012 22:33pm

## 2012-11-02 ENCOUNTER — Encounter (HOSPITAL_COMMUNITY): Payer: Self-pay

## 2012-11-02 ENCOUNTER — Inpatient Hospital Stay (HOSPITAL_COMMUNITY)
Admission: EM | Admit: 2012-11-02 | Discharge: 2012-11-08 | DRG: 897 | Disposition: A | Discharge status: Home / Self Care | Payer: No Typology Code available for payment source | Source: Intra-hospital | Attending: Psychiatry | Admitting: Psychiatry

## 2012-11-02 DIAGNOSIS — F332 Major depressive disorder, recurrent severe without psychotic features: Secondary | ICD-10-CM

## 2012-11-02 HISTORY — DX: Major depressive disorder, single episode, unspecified: F32.9

## 2012-11-02 HISTORY — DX: Depression, unspecified: F32.A

## 2012-11-02 LAB — RAPID URINE DRUG SCREEN, HOSP PERFORMED
Amphetamines: NOT DETECTED
Barbiturates: NOT DETECTED
Cocaine: NOT DETECTED
Opiates: NOT DETECTED
Tetrahydrocannabinol: NOT DETECTED

## 2012-11-02 LAB — URINALYSIS, ROUTINE W REFLEX MICROSCOPIC
Bilirubin Urine: NEGATIVE
Glucose, UA: NEGATIVE mg/dL
Hgb urine dipstick: NEGATIVE
Specific Gravity, Urine: 1.01 (ref 1.005–1.030)
Urobilinogen, UA: 0.2 mg/dL (ref 0.0–1.0)
pH: 5.5 (ref 5.0–8.0)

## 2012-11-02 MED ORDER — VITAMIN B-1 100 MG PO TABS
100.0000 mg | ORAL_TABLET | Freq: Every day | ORAL | Status: DC
  Administered 2012-11-02: 100 mg via ORAL
  Filled 2012-11-02: qty 1

## 2012-11-02 MED ORDER — LORAZEPAM 1 MG PO TABS: 0.0000 mg | ORAL_TABLET | Freq: Two times a day (BID) | ORAL | Status: DC

## 2012-11-02 MED ORDER — LORAZEPAM 1 MG PO TABS
0.0000 mg | ORAL_TABLET | Freq: Four times a day (QID) | ORAL | Status: DC
  Administered 2012-11-02: 1 mg via ORAL

## 2012-11-02 MED ORDER — LORAZEPAM 1 MG PO TABS: 1.0000 mg | ORAL_TABLET | Freq: Four times a day (QID) | ORAL | Status: DC | PRN

## 2012-11-02 MED ORDER — FOLIC ACID 1 MG PO TABS
1.0000 mg | ORAL_TABLET | Freq: Every day | ORAL | Status: DC
  Administered 2012-11-02: 1 mg via ORAL
  Filled 2012-11-02: qty 1

## 2012-11-02 MED ORDER — LORAZEPAM 2 MG/ML IJ SOLN: 1.0000 mg | Freq: Four times a day (QID) | INTRAMUSCULAR | Status: DC | PRN

## 2012-11-02 MED ORDER — WARFARIN SODIUM 7.5 MG PO TABS: 15.0000 mg | ORAL_TABLET | Freq: Once | ORAL | Status: DC

## 2012-11-02 MED ORDER — ADULT MULTIVITAMIN W/MINERALS CH
1.0000 | ORAL_TABLET | Freq: Every day | ORAL | Status: DC
  Administered 2012-11-02: 1 via ORAL
  Filled 2012-11-02: qty 1

## 2012-11-02 MED ORDER — WARFARIN - PHARMACIST DOSING INPATIENT: Freq: Every day | Status: DC

## 2012-11-02 MED ORDER — THIAMINE HCL 100 MG/ML IJ SOLN: 100.0000 mg | Freq: Every day | INTRAMUSCULAR | Status: DC

## 2012-11-02 NOTE — BHH Counselor (Signed)
TC from [facility] @ [facility]. Stated they are denying pt for admission due to his medical problems. Ms. Randa Evens stated they would not be able to provide the medical level of care this pt needs at this time. Updated CSW of this decline as well.

## 2012-11-02 NOTE — ED Notes (Signed)
Took pt a cup of coke

## 2012-11-02 NOTE — Progress Notes (Signed)
During [facility] ED 11/02/12 visit referral provided to [facility] liaison to offered services to assist with finding a [place] self pay provider, resources & health reform information Pt given information on IRC to assist with homeless status and resources Pt has been visiting [facility] facilities since 2011
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
## 2012-11-02 NOTE — ED Notes (Signed)
Security called to transport to [facility]. Pt aware per social work. Pt awaiting security arrival.

## 2012-11-02 NOTE — ED Provider Notes (Signed)
The pt stable awaiting placement  Benny Lennert, MD 11/02/12 [phone]

## 2012-11-02 NOTE — BHH Counselor (Signed)
[name], social work at [employer], submitted Pt for admission to [facility]. Thurman Coyer, [facility] confirmed there is not an appropriate bed at this time. Donell Sievert, PA reviewed clinical information and accepted Pt to the service of Dr. Jannifer Franklin pending a 400-hall bed becoming available. Disposition was communicated to [employer].  Harlin Rain Patsy Baltimore, LPC, [facility] Assessment Counselor

## 2012-11-02 NOTE — Progress Notes (Addendum)
Pt referred to [facility] pending review.  Pt referred to [facility] however directed CSW to call back after 7pm. RN walked patient and patient is able to walk fine no issues with gait or leg.  Pt referred to [facility] pending review.   Doree Albee  [phone] 11/02/2012 1834pm   Addendum 2049pm  Patient accepted to [facility] pending bed anticipated to be available tomorrow. Pt accepted by Karleen [facility] simon.   Doree Albee  [phone] 11/02/2012 1834pm   Pt accepted to 400[address]1 Donell Sievert- [place]. EDP and RN informed.   Catha Gosselin, LCSWA  [phone] .11/02/2012 2132pm

## 2012-11-02 NOTE — ED Notes (Signed)
Took pt a cup of water and a cup of coffee

## 2012-11-03 ENCOUNTER — Encounter (HOSPITAL_COMMUNITY): Payer: Self-pay | Admitting: Psychiatry

## 2012-11-03 DIAGNOSIS — F332 Major depressive disorder, recurrent severe without psychotic features: Secondary | ICD-10-CM

## 2012-11-03 LAB — PROTIME-INR
INR: 1.56 — ABNORMAL HIGH (ref 0.00–1.49)
Prothrombin Time: 18.2 seconds — ABNORMAL HIGH (ref 11.6–15.2)

## 2012-11-03 MED ORDER — NICOTINE 21 MG/24HR TD PT24
21.0000 mg | MEDICATED_PATCH | Freq: Every day | TRANSDERMAL | Status: DC
  Administered 2012-11-03 – 2012-11-08 (×6): 21 mg via TRANSDERMAL
  Filled 2012-11-03 (×7): qty 1

## 2012-11-03 MED ORDER — ENOXAPARIN SODIUM 80 MG/0.8ML ~~LOC~~ SOLN
80.0000 mg | Freq: Two times a day (BID) | SUBCUTANEOUS | Status: DC
  Administered 2012-11-03 – 2012-11-08 (×10): 80 mg via SUBCUTANEOUS
  Filled 2012-11-03 (×14): qty 0.8

## 2012-11-03 MED ORDER — WARFARIN SODIUM 7.5 MG PO TABS
15.0000 mg | ORAL_TABLET | Freq: Every day | ORAL | Status: DC
  Administered 2012-11-03 – 2012-11-05 (×3): 15 mg via ORAL
  Filled 2012-11-03 (×4): qty 2

## 2012-11-03 MED ORDER — NICOTINE 21 MG/24HR TD PT24: 21.0000 mg | MEDICATED_PATCH | Freq: Every day | TRANSDERMAL | Status: DC

## 2012-11-03 MED ORDER — CHLORDIAZEPOXIDE HCL 25 MG PO CAPS
25.0000 mg | ORAL_CAPSULE | Freq: Four times a day (QID) | ORAL | Status: AC
  Administered 2012-11-03 (×4): 25 mg via ORAL
  Filled 2012-11-03 (×4): qty 1

## 2012-11-03 MED ORDER — CHLORDIAZEPOXIDE HCL 25 MG PO CAPS
25.0000 mg | ORAL_CAPSULE | Freq: Four times a day (QID) | ORAL | Status: AC | PRN
  Administered 2012-11-04 – 2012-11-05 (×2): 25 mg via ORAL
  Filled 2012-11-03 (×2): qty 1

## 2012-11-03 MED ORDER — WARFARIN - PHARMACIST DOSING INPATIENT
Freq: Every day | Status: DC
  Filled 2012-11-03 (×9): qty 1

## 2012-11-03 MED ORDER — CHLORDIAZEPOXIDE HCL 25 MG PO CAPS
25.0000 mg | ORAL_CAPSULE | Freq: Three times a day (TID) | ORAL | Status: AC
  Administered 2012-11-04 (×3): 25 mg via ORAL
  Filled 2012-11-03 (×3): qty 1

## 2012-11-03 MED ORDER — CHLORDIAZEPOXIDE HCL 25 MG PO CAPS
25.0000 mg | ORAL_CAPSULE | ORAL | Status: AC
  Administered 2012-11-05 (×2): 25 mg via ORAL
  Filled 2012-11-03 (×2): qty 1

## 2012-11-03 MED ORDER — LOPERAMIDE HCL 2 MG PO CAPS: 2.0000 mg | ORAL_CAPSULE | ORAL | Status: AC | PRN

## 2012-11-03 MED ORDER — FLUOXETINE HCL 20 MG PO CAPS: 20.0000 mg | ORAL_CAPSULE | Freq: Every day | ORAL | Status: DC

## 2012-11-03 MED ORDER — ONDANSETRON 4 MG PO TBDP: 4.0000 mg | ORAL_TABLET | Freq: Four times a day (QID) | ORAL | Status: AC | PRN

## 2012-11-03 MED ORDER — ALUM & MAG HYDROXIDE-SIMETH 200-200-20 MG/5ML PO SUSP: 30.0000 mL | ORAL | Status: DC | PRN

## 2012-11-03 MED ORDER — CHLORDIAZEPOXIDE HCL 25 MG PO CAPS
25.0000 mg | ORAL_CAPSULE | Freq: Every day | ORAL | Status: AC
  Administered 2012-11-06: 25 mg via ORAL
  Filled 2012-11-03: qty 1

## 2012-11-03 MED ORDER — VITAMIN B-1 100 MG PO TABS
100.0000 mg | ORAL_TABLET | Freq: Every day | ORAL | Status: DC
  Administered 2012-11-04 – 2012-11-08 (×5): 100 mg via ORAL
  Filled 2012-11-03 (×7): qty 1

## 2012-11-03 MED ORDER — MIRTAZAPINE 15 MG PO TABS
15.0000 mg | ORAL_TABLET | Freq: Every day | ORAL | Status: DC
  Administered 2012-11-03 – 2012-11-07 (×5): 15 mg via ORAL
  Filled 2012-11-03 (×4): qty 1
  Filled 2012-11-03: qty 14
  Filled 2012-11-03 (×3): qty 1

## 2012-11-03 MED ORDER — HYDROXYZINE HCL 25 MG PO TABS
25.0000 mg | ORAL_TABLET | Freq: Four times a day (QID) | ORAL | Status: AC | PRN
  Administered 2012-11-03: 25 mg via ORAL

## 2012-11-03 MED ORDER — TRAZODONE HCL 50 MG PO TABS
50.0000 mg | ORAL_TABLET | Freq: Every evening | ORAL | Status: DC | PRN
  Administered 2012-11-03 – 2012-11-07 (×6): 50 mg via ORAL
  Filled 2012-11-03 (×5): qty 1
  Filled 2012-11-03 (×2): qty 28
  Filled 2012-11-03 (×11): qty 1

## 2012-11-03 MED ORDER — WARFARIN - PHYSICIAN DOSING INPATIENT
Freq: Every day | Status: DC
  Filled 2012-11-03 (×9): qty 1

## 2012-11-03 MED ORDER — TRAZODONE HCL 50 MG PO TABS
50.0000 mg | ORAL_TABLET | Freq: Every evening | ORAL | Status: DC | PRN
  Filled 2012-11-03: qty 1

## 2012-11-03 MED ORDER — NICOTINE 14 MG/24HR TD PT24
14.0000 mg | MEDICATED_PATCH | Freq: Every day | TRANSDERMAL | Status: DC
  Filled 2012-11-03: qty 1

## 2012-11-03 MED ORDER — ADULT MULTIVITAMIN W/MINERALS CH
1.0000 | ORAL_TABLET | Freq: Every morning | ORAL | Status: DC
  Administered 2012-11-03 – 2012-11-08 (×6): 1 via ORAL
  Filled 2012-11-03 (×8): qty 1

## 2012-11-03 MED ORDER — MAGNESIUM HYDROXIDE 400 MG/5ML PO SUSP: 30.0000 mL | Freq: Every day | ORAL | Status: DC | PRN

## 2012-11-03 MED ORDER — ADULT MULTIVITAMIN W/MINERALS CH: 1.0000 | ORAL_TABLET | Freq: Every day | ORAL | Status: DC

## 2012-11-03 MED ORDER — THIAMINE HCL 100 MG/ML IJ SOLN: 100.0000 mg | Freq: Once | INTRAMUSCULAR | Status: DC

## 2012-11-03 MED ORDER — ACETAMINOPHEN 325 MG PO TABS
650.0000 mg | ORAL_TABLET | Freq: Four times a day (QID) | ORAL | Status: DC | PRN
Start: 2012-11-03 — End: 2012-11-08
  Administered 2012-11-03 – 2012-11-08 (×9): 650 mg via ORAL

## 2012-11-03 NOTE — Progress Notes (Signed)
ANTICOAGULATION CONSULT NOTE - Follow Up Consult  Pharmacy Consult for lovenox Indication: h/o DVT  No Known Allergies  Patient Measurements: Patient states wt is 190 lbs 86kg  Vital Signs: Temp: 98 F (36.7 C) (02/27 0737) Temp src: Oral (02/27 0737) BP: 99/68 mmHg (02/27 0739) Pulse Rate: 89 (02/27 0739)  Labs:  Recent Labs  11/01/12 2159 11/03/12 0620  HGB 12.5*  --   HCT 37.6*  --   PLT 209  --   LABPROT 28.1* 18.2*  INR 2.80* 1.56*  CREATININE 0.63  --     The CrCl is unknown because both a height and weight (above a minimum accepted value) are required for this calculation.   Medications:  Prescriptions prior to admission  Medication Sig Dispense Refill  . Aspirin-Salicylamide-Caffeine (BC HEADACHE POWDER PO) Take 1 packet by mouth 2 (two) times daily as needed (for pain). Pain.      . Multiple Vitamin (MULTIVITAMIN WITH MINERALS) TABS Take 1 tablet by mouth every morning.       . warfarin (COUMADIN) 5 MG tablet Take 3 tablets (15 mg total) by mouth daily.  30 tablet  0    Assessment: Normal protocol.  Starting lovenox due to non therapuetic INR at 1.56 today.    Plan:  Lovenox 80 mg sq q12 8am and 8pm until INR >2.0 Continue coumadin as previously ordered Charyl Dancer 11/03/2012,1:25 PM

## 2012-11-03 NOTE — Progress Notes (Signed)
ANTICOAGULATION CONSULT NOTE - Initial Consult  Pharmacy Consult for coumadin Indication: DVT  No Known Allergies  Patient Measurements: Height: 4' 1.61" (126 cm) IBW/kg (Calculated) : 26.09   Vital Signs: Temp: 98 F (36.7 C) (02/27 0737) Temp src: Oral (02/27 0737) BP: 99/68 mmHg (02/27 0739) Pulse Rate: 89 (02/27 0739)  Labs:  Recent Labs  11/01/12 2159 11/03/12 0620  HGB 12.5*  --   HCT 37.6*  --   PLT 209  --   LABPROT 28.1* 18.2*  INR 2.80* 1.56*  CREATININE 0.63  --     The CrCl is unknown because both a height and weight (above a minimum accepted value) are required for this calculation.   Medical History: Past Medical History  Diagnosis Date  . Coronary artery disease   . Degenerative joint disease of spine   . Pulmonary embolism 06/2010  . Degenerative joint disease   . 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 ETOH abuse   . DVT (deep venous thrombosis)   . Hepatitis B     Medications:  Prescriptions prior to admission  Medication Sig Dispense Refill  . Aspirin-Salicylamide-Caffeine (BC HEADACHE POWDER PO) Take 1 packet by mouth 2 (two) times daily as needed (for pain). Pain.      . Multiple Vitamin (MULTIVITAMIN WITH MINERALS) TABS Take 1 tablet by mouth every morning.       . warfarin (COUMADIN) 5 MG tablet Take 3 tablets (15 mg total) by mouth daily.  30 tablet  0    Assessment: Normal protocol. Patient was therapeutic on 2/25 with INR of 2.8 and today 2/27 INR dropped to 1.56.  No Coumadin charted on 2/26 noted.  Decrease result of missed coumadin doses.    Patient stated that he has been non-compliant with coumadin but has restarted it before this admission due to lower extremity pain.  Complained of lower extremity pain at ED on this admission.  Goal of Therapy:  INR 2-3    Plan:  Coumadin 15 mg po daily order, which is home dose. Will speak with MD regarding addition of lovenox until INR >2.0 PT/INR in am 11/04/12 Follow up with  labs in am  Charyl Dancer 11/03/2012,8:04 AM
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
## 2012-11-03 NOTE — Progress Notes (Signed)
D: Patient denies SI/HI and auditory and visual hallucinations. The patient has a depressed mood and affect. The patient states that he is "here for detox." The patient is isolating to his room on the unit and is resting throughout the day.   A: Patient given emotional support from RN. Patient encouraged to come to staff with concerns and/or questions. Patient's medication routine continued. Patient's orders and plan of care reviewed.  R: Patient remains appropriate and cooperative. Will continue to monitor patient q15 minutes for safety.

## 2012-11-03 NOTE — Clinical Social Work Note (Signed)
BHH Group Notes:  (Counselor/Nursing/MHT/Case Management/Adjunct)  11/03/2012 2:18 PM   Type of Therapy:  Group Therapy  Did not attend   Smart, Heather N 07/21/2012, 2:34 PM

## 2012-11-03 NOTE — Treatment Plan (Signed)
Interdisciplinary Treatment Plan Update  Date Reviewed: 11/03/2012  Time Reviewed: 10:08 AM  Progress in Treatment:  Attending groups: Yes  Participating in groups: Yes  Taking medication as prescribed: Yes  Tolerating medication: Yes  Family/Significant other contact made: No.  Patient understands diagnosis: Yes, as evidenced by seeking detox for alcohol.   Discussing patient identified problems/goals with staff: Yes  See initial tx plan Medical problems stabilized or resolved: Yes  Denies suicidal/homicidal ideation: Yes, in group. Patient has not harmed self or others: Yes  For review of initial/current patient goals, please see plan of care.  Estimated Length of Stay: 3-5 days Reasons for Continued Hospitalization:  Librium taper-detox Depression  Medication stabilization  New Problems/Goals identified: Pt reports heavy drinking for extended period and feelings of depression/worthlessness. He has court order to attend treatment for alcohol abuse.  Discharge Plan or Barriers: Pt interested in longer term treatment for substance abuse.  Additional Comments:  Attendees:  Signature:    Signature: Thedore Mins, MD 11/03/2012 10:08 AM  Signature: Richelle Ito LCSW 11/03/2012 10:08 AM  Signature: Liborio Nixon 11/03/2012 10:08AM  Signature:    Signature:   Signature: Herbert Seta Smart MSW Intern 11/03/2012 10:08 AM  Signature:    Signature:    Signature:    Signature:    Signature:    Signature:    Scribe for Treatment Team:  Trula Slade, 11/03/2012 10:08 AM

## 2012-11-03 NOTE — Progress Notes (Signed)
Pt resting in bed with eyes closed. RR WNL, even and unlabored. Level III obs in place for safety and pt remains safe. [name]  

## 2012-11-03 NOTE — Tx Team (Signed)
Initial Interdisciplinary Treatment Plan  PATIENT STRENGTHS: (choose at least two) Ability for insight Average or above average intelligence Motivation for treatment/growth  PATIENT STRESSORS: Financial difficulties Health problems Legal issue Substance abuse   PROBLEM LIST: Problem List/Patient Goals Date to be addressed Date deferred Reason deferred Estimated date of resolution  ETOH abuse 11/02/2012     anxiety 11/02/2012     homeless 11/02/2012                                          DISCHARGE CRITERIA:  Ability to meet basic life and health needs Adequate post-discharge living arrangements Motivation to continue treatment in a less acute level of care Withdrawal symptoms are absent or subacute and managed without 24-hour nursing intervention  PRELIMINARY DISCHARGE PLAN: Attend aftercare/continuing care group Attend PHP/IOP Outpatient therapy Placement in alternative living arrangements  PATIENT/FAMIILY INVOLVEMENT: This treatment plan has been presented to and reviewed with the patient, [name], and/or family member,  The patient and family have been given the opportunity to ask questions and make suggestions.  JEHU-APPIAH, Tavonte Seybold K 11/03/2012, 12:55 AM

## 2012-11-03 NOTE — [facility] Suicide Risk Assessment (Signed)
Suicide Risk Assessment  Admission Assessment     Nursing information obtained from:  Patient Demographic factors:  Male;Caucasian;Low socioeconomic status;Living alone;Unemployed Current Mental Status:  NA Loss Factors:  Loss of significant relationship;Legal issues Historical Factors:  Family history of mental illness or substance abuse Risk Reduction Factors:  NA  CLINICAL FACTORS:   Depression:   Anhedonia Comorbid alcohol abuse/dependence Hopelessness Alcohol/Substance Abuse/Dependencies  COGNITIVE FEATURES THAT CONTRIBUTE TO RISK:  Closed-mindedness    SUICIDE RISK:   Mild:  Suicidal ideation of limited frequency, intensity, duration, and specificity.  There are no identifiable plans, no associated intent, mild dysphoria and related symptoms, good self-control (both objective and subjective assessment), few other risk factors, and identifiable protective factors, including available and accessible social support.  PLAN OF CARE:1. Admit for crisis management and stabilization. 2. Medication management to reduce current symptoms to base line and improve the  patient's overall level of functioning 3. Treat health problems as indicated. 4. Develop treatment plan to decrease risk of relapse upon discharge and the need for readmission. 5. Psycho-social education regarding relapse prevention and self care. 6. Health care follow up as needed for medical problems. 7. Restart home medications where appropriate.   I certify that inpatient services furnished can reasonably be expected to improve the patient's condition.  [name] 11/03/2012, 10:54 AM

## 2012-11-03 NOTE — Progress Notes (Signed)
Psychoeducational Group Note  Date:  11/03/2012 Time:  0930  Group Topic/Focus:  Rediscovering Joy:   The focus of this group is to explore various ways to relieve stress in a positive manner.  Participation Level: Did Not Attend  Participation Quality:  Not Applicable  Affect:  Not Applicable  Cognitive:  Not Applicable  Insight:  Not Applicable  Engagement in Group: Not Applicable  Additional Comments:  Pt was not feeling well at the time of group, he was allowed to rest in bed.  [name] E 11/03/2012, 3:00 PM

## 2012-11-03 NOTE — Progress Notes (Signed)
[facility] LCSW Aftercare Discharge Planning Group Note  11/03/2012 9:17 AM  Participation Quality:  Appropriate  Affect:  Anxious  Cognitive:  Appropriate  Insight:  Engaged  Engagement in Group:  Engaged  Modes of Intervention:  Clarification, Discussion, Problem-solving, Socialization and Support  Summary of Progress/Problems: Pt explained that "somebody called the ambulance" when asked whose idea it was for him to come to the hospital. He reported that he may have had a seizure prior to this person calling for help. Pt is interested in receiving treatment for alcohol abuse after stabilization and stated that it is court ordered. He reports drinking about a case of beer during each day and 1/5 of liquor each evening in order to help him sleep. The only time he remains sober is when he is in jail--last incarcerated a few months ago. He reports feeling bad today--some shakiness, agitation, and anxiety.   Smart, Heather N 11/03/2012, 9:17 AM
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
## 2012-11-04 MED ORDER — WARFARIN SODIUM 7.5 MG PO TABS: 15.0000 mg | ORAL_TABLET | Freq: Once | ORAL | Status: DC

## 2012-11-04 MED ORDER — TUBERCULIN PPD 5 UNIT/0.1ML ID SOLN
5.0000 [IU] | Freq: Once | INTRADERMAL | Status: AC
  Administered 2012-11-04: 5 [IU] via INTRADERMAL
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
## 2012-11-04 NOTE — Clinical Social Work Note (Signed)
Rece expressed interest in [facility] program for follow up.  He filled out an application, and it was FAXed in today.  Back up plan is [facility].

## 2012-11-04 NOTE — Progress Notes (Signed)
[facility] LCSW Aftercare Discharge Planning Group Note  11/04/2012 9:30 AM  Participation Quality:  Appropriate  Affect:  Anxious  Cognitive:  Appropriate  Insight:  Engaged  Engagement in Group:  Engaged  Modes of Intervention:  Discussion, Problem-solving, Socialization and Support  Summary of Progress/Problems: [name] reported that physical withdrawal symptoms have drastically decreased today. He rates anxiety as a 6 this morning. [name] was presented with options for treatment in group. He expressed interest in [facility] program in [facility] and was given application to complete. He also discussed his concerns regarding disability check being sent to wrong address for six months and asked for contact information to Social Security office to straighten this out.   Smart, Heather N 11/04/2012, 9:30 AM

## 2012-11-04 NOTE — Progress Notes (Signed)
[facility] MD Progress Note  11/04/2012 7:00 PM Dev Dhondt  MRN:  [id number] Subjective:  "I'm pretty anxious, I don't know where I'm going next." "Feeling a little better, but just worried about disability hearing, getting into [facility]." Objective: Patient is upright and active in the unit milieu, reports no new symptoms. States anxiety is biggest problem . 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  Psychiatric Specialty Exam: Review of Systems  Constitutional: Negative.  Negative for fever, chills, weight loss, malaise/fatigue and diaphoresis.  HENT: Negative for congestion and sore throat.   Eyes: Negative for blurred vision, double vision and photophobia.  Respiratory: Negative for cough, shortness of breath and wheezing.   Cardiovascular: Negative for chest pain, palpitations and PND.  Gastrointestinal: Negative for heartburn, nausea, vomiting, abdominal pain, diarrhea and constipation.  Musculoskeletal: Negative for myalgias, joint pain and falls.  Neurological: Negative for dizziness, tingling, tremors, sensory change, speech change, focal weakness, seizures, loss of consciousness, weakness and headaches.  Endo/Heme/Allergies: Negative for polydipsia. Does not bruise/bleed easily.  Psychiatric/Behavioral: Negative for depression, suicidal ideas, hallucinations, memory loss and substance abuse. The patient is not nervous/anxious and does not have insomnia.     Blood pressure 108/74, pulse 103, temperature 98.3 F (36.8 C), temperature source Oral, resp. rate 18, height 4' 1.61" (1.26 m).There is no weight on file to calculate BMI.  General Appearance: Disheveled  Eye Solicitor::  Fair  Speech:   Clear and Coherent  Volume:  Normal  Mood:  Anxious  Affect:  Congruent  Thought Process:  Linear  Orientation:  Full (Time, Place, and Person)  Thought Content:  Negative  Suicidal Thoughts:  No  Homicidal Thoughts:  No  Memory:  Immediate;   Fair  Judgement:  Fair  Insight:  Shallow  Psychomotor Activity:  Restlessness and Tremor  Concentration:  Fair  Recall:  Fair  Akathisia:  No  Handed:  Right  AIMS (if indicated):     Assets:  Communication Skills Desire for Improvement  Sleep:  Number of Hours: 6.25   Current Medications: Current Facility-Administered Medications  Medication Dose Route Frequency Provider Last Rate Last Dose  . acetaminophen (TYLENOL) tablet 650 mg  650 mg Oral Q6H PRN Kerry Hough, PA   650 mg at 11/04/12 1023  . alum & mag hydroxide-simeth (MAALOX/MYLANTA) 200-200-20 MG/5ML suspension 30 mL  30 mL Oral Q4H PRN Kerry Hough, PA      . chlordiazePOXIDE (LIBRIUM) capsule 25 mg  25 mg Oral Q6H PRN Kerry Hough, PA      . Melene Muller ON 11/05/2012] chlordiazePOXIDE (LIBRIUM) capsule 25 mg  25 mg Oral BH-qamhs Kerry Hough, PA       Followed by  . [START ON 11/06/2012] chlordiazePOXIDE (LIBRIUM) capsule 25 mg  25 mg Oral Daily Kerry Hough, PA      . enoxaparin (LOVENOX) injection 80 mg  80 mg Subcutaneous BID Mojeed Akintayo   80 mg at 11/04/12 0746  . hydrOXYzine (ATARAX/VISTARIL) tablet 25 mg  25 mg Oral Q6H PRN Kerry Hough, PA   25 mg at 11/03/12 0045  . loperamide (IMODIUM) capsule 2-4 mg  2-4 mg Oral PRN Kerry Hough, PA      . magnesium hydroxide (MILK OF MAGNESIA) suspension 30 mL  30 mL Oral Daily PRN Kerry Hough, PA      . mirtazapine (REMERON) tablet 15 mg  15 mg Oral QHS Mojeed Akintayo   15 mg at 11/03/12 2141  . multivitamin with minerals tablet 1 tablet  1 tablet Oral q morning - 10a Kerry Hough, PA   1 tablet at 11/04/12 0745  . nicotine (NICODERM CQ - dosed in mg/24 hours) patch 21 mg  21 mg Transdermal Q0600 Mojeed  Akintayo   21 mg at 11/04/12 0617  . ondansetron (ZOFRAN-ODT) disintegrating tablet 4 mg  4 mg Oral Q6H PRN Kerry Hough, PA      . thiamine (B-1) injection 100 mg  100 mg Intramuscular Once Kerry Hough, PA      . thiamine (VITAMIN B-1) tablet 100 mg  100 mg Oral Daily Kerry Hough, PA   100 mg at 11/04/12 0745  . traZODone (DESYREL) tablet 50 mg  50 mg Oral QHS,MR X 1 Kerry Hough, PA   50 mg at 11/03/12 2141  . tuberculin injection 5 Units  5 Units Intradermal Once Verne Spurr, PA-C   5 Units at 11/04/12 1548  . warfarin (COUMADIN) tablet 15 mg  15 mg Oral Daily Kerry Hough, PA   15 mg at 11/04/12 1606  . Warfarin - Pharmacist Dosing Inpatient   Does not apply q1800 Mojeed Akintayo        Lab Results:  Results for orders placed during the hospital encounter of 11/02/12 (from the past 48 hour(s))  TSH     Status: None   Collection Time    11/03/12  6:20 AM      Result Value Range   TSH 1.909  0.350 - 4.500 uIU/mL  PROTIME-INR     Status: Abnormal   Collection Time    11/03/12  6:20 AM      Result Value Range   Prothrombin Time 18.2 (*) 11.6 - 15.2 seconds   INR 1.56 (*) 0.00 - 1.49  PROTIME-INR     Status: None   Collection Time    11/04/12  6:20 AM      Result Value Range   Prothrombin Time 13.4  11.6 - 15.2 seconds   INR 1.03  0.00 - 1.49    Physical Findings: AIMS: Facial and Oral Movements Muscles of Facial Expression: None, normal Lips and Perioral Area: None, normal Jaw: None, normal Tongue: None, normal,Extremity Movements Upper (arms, wrists, hands, fingers): Mild Lower (legs, knees, ankles, toes): Mild, Trunk Movements Neck, shoulders, hips: None, normal, Overall Severity Severity of abnormal movements (highest score from questions above): Minimal Incapacitation due to abnormal movements: None, normal Patient's awareness of abnormal movements (rate only patient's report): Aware, no distress, Dental Status Current problems with teeth and/or  dentures?: No Does patient usually wear dentures?: No  CIWA:  CIWA-Ar Total: 2 COWS:     Treatment Plan Summary: Daily contact with patient to assess and evaluate symptoms and progress in treatment Medication management  Plan: 1. Continue crisis management and stabilization. 2. Medication management to reduce current symptoms to base line and improve the patient's overall level of functioning 3. Treat health problems as indicated. 4. Develop treatment plan to decrease risk of relapse upon discharge and the need for readmission. 5. Psycho-social education regarding relapse prevention and self care. 6. Health care follow up as  needed for medical problems. 7. Restart home medications where appropriate. 8. Patient desires to go to [facility] if he is accepted. 9. PPD is placed today. 10. Continue detox protocol as indicated. 11. CM will contact [facility] for possible placement. 12. May discharge to [facility] if bed is available over the weekend, or to [facility] if accepted. 13. ELOS 2-3 days. Medical Decision Making Problem Points:  Established problem, stable/improving (1) and Review of psycho-social stressors (1) Data Points:  Review of medication regiment & side effects (2)  I certify that inpatient services furnished can reasonably be expected to improve the patient's condition.  Rona Ravens. Nala Kachel RPAC 7:14 PM 11/04/2012

## 2012-11-04 NOTE — Progress Notes (Signed)
D: Patient denies SI/HI and auditory and visual hallucinations. The patient has a depressed mood and affect. The patient rates his depression a 6 out of 10 and his hopelessness an 8 out of 10 (1 low/10 high). The patient reports sleeping fairly well and states that his appetite is improving but that his energy level is low. The patient frequently isolates to his room to rest throughout the day. The patient is exhibiting tremors and anxiety related to withdrawal but states that he feels "much better."  A: Patient given emotional support from RN. Patient encouraged to come to staff with concerns and/or questions. Patient's medication routine continued. Patient's orders and plan of care reviewed.   R: Patient remains cooperative. Will continue to monitor patient q15 minutes for safety.

## 2012-11-04 NOTE — Progress Notes (Signed)
Recreation Therapy Notes   Date: 02.28.2014 Time: 9:30am Location: [address] Day Room      Group Topic/Focus: Communication  Participation Level: Active  Participation Quality: Appropriate  Affect: Euthymic  Cognitive: Appropriate   Additional Comments: Patient with peers tossed a communication ball with phrases written on it. Newman Pies has phrases such as Research scientist (physical sciences)?" "If you could travel any place in the world where would you go?" "Favorite TV show?" "Name 3 positive coping skills." "If you had three wishes what would they be?" "Name one thing you have learned while at [facility]." Patient answered questions from ball appropriately. Patient selected "If you had three wishes what would they be?" Patient stated he would ask for three more wishes. LRT asked patient what he would do with his additional two wishes, patient responded "Well I'd wouldn't need them if I had six wishes." Patient stated healthy communication can help him build a support system.    Marykay Lex Shelah Heatley, LRT/CTRS    Samaa Ueda L 11/04/2012 11:40 AM

## 2012-11-04 NOTE — Progress Notes (Signed)
PPD test given on right arm on 11/04/12 at 1548. Please read on 11/06/12.

## 2012-11-04 NOTE — Progress Notes (Signed)
ANTICOAGULATION CONSULT NOTE - Follow Up Consult  Pharmacy Consult for Coumadin Indication: DVT  No Known Allergies  Patient Measurements: Height: 4' 1.61" (126 cm) IBW/kg (Calculated) : 26.09   Vital Signs: BP: 139/89 mmHg (02/27 2140) Pulse Rate: 89 (02/27 2140)  Labs:  Recent Labs  11/01/12 2159 11/03/12 0620 11/04/12 0620  HGB 12.5*  --   --   HCT 37.6*  --   --   PLT 209  --   --   LABPROT 28.1* 18.2* 13.4  INR 2.80* 1.56* 1.03  CREATININE 0.63  --   --     The CrCl is unknown because both a height and weight (above a minimum accepted value) are required for this calculation.   Medications:  Scheduled:  . [COMPLETED] chlordiazePOXIDE  25 mg Oral QID   Followed by  . chlordiazePOXIDE  25 mg Oral TID   Followed by  . [START ON 11/05/2012] chlordiazePOXIDE  25 mg Oral BH-qamhs   Followed by  . [START ON 11/06/2012] chlordiazePOXIDE  25 mg Oral Daily  . enoxaparin (LOVENOX) injection  80 mg Subcutaneous BID  . mirtazapine  15 mg Oral QHS  . multivitamin with minerals  1 tablet Oral q morning - 10a  . nicotine  21 mg Transdermal Q0600  . thiamine  100 mg Intramuscular Once  . thiamine  100 mg Oral Daily  . traZODone  50 mg Oral QHS,MR X 1  . warfarin  15 mg Oral Daily  . Warfarin - Pharmacist Dosing Inpatient   Does not apply q1800  . [DISCONTINUED] FLUoxetine  20 mg Oral Daily  . [DISCONTINUED] warfarin  15 mg Oral ONCE-1800    Assessment: Patient INR dropped to 1.03  Goal of Therapy:  INR 2-3    Plan:  Will continue Lovenox until INR is > 2    Will give Coumadin 15 mg today and recheck PT/INR in AM  Malva Cogan 11/04/2012,8:16 AM

## 2012-11-05 LAB — PROTIME-INR: Prothrombin Time: 14.2 seconds (ref 11.6–15.2)

## 2012-11-05 NOTE — Progress Notes (Signed)
Patient ID: [name], male   DOB: Jan 13, 1966, 47 y.o.   MRN: [id number] D)  Was resting this evening with leg elevated.  Left lower leg still somewhat reddened and swollen, warm.  Denied pain in leg, but did c/o headache.  States also gets a sciatica-like pain if he is on his feet for very long.  Currently denies other c/o's, came out of room briefly and came to med window, compliant with meds, denies thoughts of self harm at this time.   A)  Will continue to monitor for safety, continue POC. R)  Safety maintained.

## 2012-11-05 NOTE — Progress Notes (Signed)
Patient ID: [name], male   DOB: November 10, 1965, 47 y.o.   MRN: [id number] D. The patient has an anxious mood and affect. He is pleasant and cooperative and is present in the milieu. Minimal interaction initiated, but responds appropriately when engaged by others. His goal is to include more exercise in his daily routine. A. Met with patient 1:1. Discussed discharge plans. Reviewed and administered medications. Encouraged to attend evening group. R. Attended and participated in evening group. Stated that he has attended all his groups today. Would like to go to a long term etoh rehab upon discharge. Compliant with medication.

## 2012-11-05 NOTE — Progress Notes (Signed)
ANTICOAGULATION CONSULT NOTE - Follow Up Consult  Pharmacy Consult for Coumadin Indication: DVT  No Known Allergies  Patient Measurements: Height: 4' 1.61" (126 cm) IBW/kg (Calculated) : 26.09   Vital Signs: Temp: 97.6 F (36.4 C) (03/01 0700) BP: 108/76 mmHg (03/01 0701) Pulse Rate: 91 (03/01 0701)  Labs:  Recent Labs  11/03/12 0620 11/04/12 0620 11/05/12 0628  LABPROT 18.2* 13.4 14.2  INR 1.56* 1.03 1.11    The CrCl is unknown because both a height and weight (above a minimum accepted value) are required for this calculation.   Medications:  Scheduled:  . [COMPLETED] chlordiazePOXIDE  25 mg Oral TID   Followed by  . chlordiazePOXIDE  25 mg Oral BH-qamhs   Followed by  . [START ON 11/06/2012] chlordiazePOXIDE  25 mg Oral Daily  . enoxaparin (LOVENOX) injection  80 mg Subcutaneous BID  . mirtazapine  15 mg Oral QHS  . multivitamin with minerals  1 tablet Oral q morning - 10a  . nicotine  21 mg Transdermal Q0600  . thiamine  100 mg Intramuscular Once  . thiamine  100 mg Oral Daily  . traZODone  50 mg Oral QHS,MR X 1  . tuberculin  5 Units Intradermal Once  . warfarin  15 mg Oral Daily  . Warfarin - Pharmacist Dosing Inpatient   Does not apply q1800    Assessment: Patient INR is now 1.11 and PT is 14.2  Goal of Therapy:  INR 2-3    Plan:  Will give another 15 mg dose of Coumadin tonight and recheck INR in AM  Pamala Duffel L 11/05/2012,10:06 AM

## 2012-11-05 NOTE — Progress Notes (Signed)
Patient ID: [name], male   DOB: [birth year], 47 y.o.   MRN: [id number] Psychoeducational Group Note  Date:  11/05/2012 Time:0930am  Group Topic/Focus:  Identifying Needs:   The focus of this group is to help patients identify their personal needs that have been historically problematic and identify healthy behaviors to address their needs.  Participation Level:  Active  Participation Quality:  Appropriate  Affect:  Appropriate  Cognitive:  Appropriate  Insight:  Supportive  Engagement in Group:  Supportive  Additional Comments:  Inventory group   Valente David 11/05/2012,9:55 AM

## 2012-11-05 NOTE — Progress Notes (Signed)
[facility] MD Progress Note  11/05/2012 9:47 AM [name]  MRN:  [id number]  Subjective: I'm feeling better today.  Objective: Patient seen and chart reviewed.  Patient continues to have mild tremors and shakes but overall he is feeling better.  He had a good night sleep.  He still has anxiety but he is compliant with his Librium protocol.  He is seeing more active in the groups.  There has been no management problem.  He is very anxious about his long-term rehabilitation.  Patient mentioned that he had PPD placed yesterday and in the past it was positive and then he had a chest x-ray and requires 6 months of medication.  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  Psychiatric Specialty Exam: Review of Systems  Constitutional: Negative.  Negative for fever, chills, weight loss, malaise/fatigue and diaphoresis.  HENT: Negative for congestion and sore throat.   Eyes: Negative for blurred vision, double vision and photophobia.  Respiratory: Negative for cough, shortness of breath and wheezing.   Cardiovascular: Negative for chest pain, palpitations and PND.  Gastrointestinal: Negative for heartburn, nausea, vomiting, abdominal pain, diarrhea and constipation.  Musculoskeletal: Negative for myalgias, joint pain and falls.  Neurological: Positive for dizziness and tremors. Negative for tingling, sensory change, speech change, focal weakness, seizures, loss of consciousness, weakness and headaches.  Endo/Heme/Allergies: Negative for polydipsia. Does not bruise/bleed easily.  Psychiatric/Behavioral: Positive for depression. Negative for hallucinations, memory loss and substance abuse. The patient is  nervous/anxious. The patient does not have insomnia.     Blood pressure 108/76, pulse 91, temperature 97.6 F (36.4 C), temperature source Oral, resp. rate 20, height 4' 1.61" (1.26 m).There is no weight on file to calculate BMI.  General Appearance: Disheveled  Eye Solicitor::  Fair  Speech:  Clear and Coherent  Volume:  Normal  Mood:  Anxious  Affect:  Congruent  Thought Process:  Linear  Orientation:  Full (Time, Place, and Person)  Thought Content:  Negative  Suicidal Thoughts:  No  Homicidal Thoughts:  No  Memory:  Immediate;   Fair  Judgement:  Fair  Insight:  Shallow  Psychomotor Activity:  Restlessness and Tremor  Concentration:  Fair  Recall:  Fair  Akathisia:  No  Handed:  Right  AIMS (if indicated):     Assets:  Communication Skills Desire for Improvement  Sleep:  Number of Hours: 6.75   Current Medications: Current Facility-Administered Medications  Medication Dose Route Frequency Provider Last Rate Last Dose  . acetaminophen (TYLENOL) tablet 650 mg  650 mg Oral Q6H PRN Kerry Hough, PA   650 mg at 11/05/12 0809  . alum & mag hydroxide-simeth (MAALOX/MYLANTA) 200-200-20 MG/5ML suspension 30 mL  30 mL Oral Q4H PRN Kerry Hough, PA      . chlordiazePOXIDE (LIBRIUM) capsule 25 mg  25 mg Oral Q6H PRN Kerry Hough, PA   25 mg at 11/04/12 2150  . chlordiazePOXIDE (LIBRIUM) capsule 25 mg  25 mg Oral BH-qamhs Kerry Hough, PA   25 mg at 11/05/12 4098   Followed by  . [START ON 11/06/2012] chlordiazePOXIDE (LIBRIUM) capsule 25 mg  25 mg Oral Daily Kerry Hough, PA      . enoxaparin (LOVENOX) injection 80 mg  80 mg Subcutaneous BID Mojeed Akintayo   80 mg at 11/05/12 0807  . hydrOXYzine (ATARAX/VISTARIL) tablet 25 mg  25 mg Oral Q6H PRN Kerry Hough, PA   25 mg at 11/03/12 0045  . loperamide (IMODIUM) capsule 2-4 mg  2-4 mg Oral PRN Kerry Hough, PA      . magnesium hydroxide (MILK OF MAGNESIA) suspension 30 mL  30 mL Oral Daily PRN Kerry Hough, PA       . mirtazapine (REMERON) tablet 15 mg  15 mg Oral QHS Mojeed Akintayo   15 mg at 11/04/12 2150  . multivitamin with minerals tablet 1 tablet  1 tablet Oral q morning - 10a Kerry Hough, PA   1 tablet at 11/05/12 4098  . nicotine (NICODERM CQ - dosed in mg/24 hours) patch 21 mg  21 mg Transdermal Q0600 Mojeed Akintayo   21 mg at 11/05/12 0701  . ondansetron (ZOFRAN-ODT) disintegrating tablet 4 mg  4 mg Oral Q6H PRN Kerry Hough, PA      . thiamine (B-1) injection 100 mg  100 mg Intramuscular Once Kerry Hough, PA      . thiamine (VITAMIN B-1) tablet 100 mg  100 mg Oral Daily Kerry Hough, PA   100 mg at 11/05/12 0808  . traZODone (DESYREL) tablet 50 mg  50 mg Oral QHS,MR X 1 Kerry Hough, PA   50 mg at 11/04/12 2150  . tuberculin injection 5 Units  5 Units Intradermal Once Verne Spurr, PA-C   5 Units at 11/04/12 1548  . warfarin (COUMADIN) tablet 15 mg  15 mg Oral Daily Kerry Hough, PA   15 mg at 11/04/12 1606  . Warfarin - Pharmacist Dosing Inpatient   Does not apply q1800 Mojeed Akintayo        Lab Results:  Results for orders placed during the hospital encounter of 11/02/12 (from the past 48 hour(s))  PROTIME-INR     Status: None   Collection Time    11/04/12  6:20 AM      Result Value Range   Prothrombin Time 13.4  11.6 - 15.2 seconds   INR 1.03  0.00 - 1.49  PROTIME-INR     Status: None   Collection Time    11/05/12  6:28 AM      Result Value Range   Prothrombin Time 14.2  11.6 - 15.2 seconds   INR 1.11  0.00 - 1.49    Physical Findings: AIMS: Facial and Oral Movements Muscles of Facial Expression: None, normal Lips and Perioral Area: None, normal Jaw: None, normal Tongue: None, normal,Extremity Movements Upper (arms, wrists, hands, fingers): Mild Lower (legs, knees, ankles, toes): Mild, Trunk Movements Neck, shoulders, hips: None, normal, Overall Severity Severity of abnormal movements (highest score from questions above): Minimal Incapacitation due to  abnormal movements: None, normal Patient's awareness of abnormal movements (rate only patient's report): Aware, no distress, Dental Status Current problems with teeth and/or dentures?: No Does patient usually wear dentures?: No  CIWA:  CIWA-Ar Total: 5 COWS:     Treatment Plan Summary: Daily contact with patient to assess and evaluate symptoms and progress in treatment Medication management  Plan: Continue crisis management and stabilization.  Continue Librium protocol. Continue current psychiatric medication and encouragement to participate in group therapy. 1. Continue crisis management and stabilization. Teeth health problem as needed.  Developed a plan to decrease risk of relapse upon discharge and need of readmission.  Continue psychosocial education regarding relapse prevention of self care.  Continue followup on long-term inpatient rehabilitation.  At this time patient does not have any side effects from medication.  He has PPD placed yesterday.  Medical Decision Making Problem Points:  Established problem, stable/improving (1) and Review of psycho-social stressors (1) Data Points:  Review of medication regiment & side effects (2)  I certify that inpatient services furnished can reasonably be expected to improve the patient's condition.  Rona Ravens. [place]burn RPAC 9:47 AM 11/05/2012

## 2012-11-05 NOTE — Progress Notes (Signed)
D   Pt has been appropriate and cooperative   He interacts minimally with others but his interactions are appropriate    His thinking is logical and speech is coherent   He attends and participates in groups   He appears sad and somewhat depressed   Denies suicidal and homicidal ideation  A   Verbal support given  Medications administered and effectiveness monitored   Q 15 min checks R   Pt safe at present

## 2012-11-05 NOTE — Clinical Social Work Note (Signed)
[facility] Group Notes:  (Clinical Social Work)  11/05/2012  11:15-11:45AM  Summary of Progress/Problems:   The main focus of today's process group was for the patient to identify ways in which they have in the past sabotaged their own recovery and reasons they may have done this/what they received from doing it.  We then worked to identify a specific plan to avoid doing this when discharged from the hospital for this admission.  The patient expressed that he self-sabotages by missing doctor appointments, using substances, and stopping his medications.  His plan is to go to [facility], and he stated the application has been faxed already.  Type of Therapy:  Group Therapy - Process  Participation Level:  Active  Participation Quality:  Attentive and Sharing  Affect:  Blunted and Depressed  Cognitive:  Appropriate  Insight:  Developing/Improving  Engagement in Therapy:  Engaged  Modes of Intervention:  Clarification, Education, Limit-setting, Problem-solving, Socialization, Support and Processing, Exploration, Discussion   Ambrose Mantle, LCSW 11/05/2012, 12:37 PM

## 2012-11-05 NOTE — Progress Notes (Signed)
Psychoeducational Group Note  Date:  11/05/2012 Time:  0945 am  Group Topic/Focus:  Identifying Needs:   The focus of this group is to help patients identify their personal needs that have been historically problematic and identify healthy behaviors to address their needs.  Participation Level:  Active  Participation Quality:  Appropriate  Affect:  Blunted  Cognitive:  Appropriate  Insight:  Engaged  Engagement in Group:  Engaged  Additional Comments:    Andrena Mews 11/05/2012,10:45 AM

## 2012-11-06 LAB — CBC
HCT: 42.9 % (ref 39.0–52.0)
MCH: 31.2 pg (ref 26.0–34.0)
MCHC: 31.7 g/dL (ref 30.0–36.0)
RDW: 17.8 % — ABNORMAL HIGH (ref 11.5–15.5)

## 2012-11-06 LAB — PROTIME-INR
INR: 1.02 (ref 0.00–1.49)
Prothrombin Time: 13.3 seconds (ref 11.6–15.2)

## 2012-11-06 MED ORDER — WARFARIN SODIUM 10 MG PO TABS
20.0000 mg | ORAL_TABLET | Freq: Once | ORAL | Status: AC
  Administered 2012-11-06: 20 mg via ORAL
  Filled 2012-11-06: qty 2

## 2012-11-06 NOTE — Clinical Social Work Note (Signed)
[facility] Group Notes:  (Clinical Social Work)  11/06/2012   11:15-11:45AM  Summary of Progress/Problems:  The main focus of today's process group was to listen to a variety of genres of music and to identify that different types of music provoke different responses.  The patient then was able to identify personally what was soothing for them, as well as energizing.  Handouts were used to record feelings evoked, as well as how patient can personally use this knowledge in sleep habits, with depression, and with other symptoms.  The patient expressed enjoyment of much of the music, and verbalized understanding how it affected his thoughts and moods.  He expressed a willingness to utilize this information in his wellness plan.  Type of Therapy:  Music Therapy with processing done  Participation Level:  Active  Participation Quality:  Attentive, Sharing and Supportive  Affect:  Blunted  Cognitive:  Appropriate and Oriented  Insight:  Engaged  Engagement in Therapy:  Engaged  Modes of Intervention:   Socialization, Support and Processing, Exploration, Education, Rapport Building   [employer], LCSW 11/06/2012, 12:22 PM

## 2012-11-06 NOTE — Progress Notes (Signed)
D   Pt has been appropriate and cooperative   He interacts minimally with others but his interactions are appropriate    His thinking is logical and speech is coherent   He attends and participates in groups   He appears sad and somewhat depressed   Denies suicidal and homicidal ideation  A   Verbal support given  Medications administered and effectiveness monitored   Q 15 min checks R   Pt safe at present 

## 2012-11-06 NOTE — Progress Notes (Signed)
D. Pt has been up and has been visible in milieu this evening, has been participating in various milieu activities throughout the day. Pt did speak this evening about his tremors are getting better and he is feeling better overall. Pt has denied any SI/HI this evening and has received medications without incident. A. Support and encouragement provided. R. Will continue to monitor.

## 2012-11-06 NOTE — Progress Notes (Signed)
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

## 2012-11-06 NOTE — Progress Notes (Signed)
[facility] MD Progress Note  11/06/2012 1:25 PM Nellie Chevalier  MRN:  [id number]  Subjective: I'm feeling better today.  Objective: Patient seen and chart reviewed.  Patient continues to have mild tremors and shakes but overall he is feeling better.  He is hoping for inpatient rehabilitation.  He sleeping better.  He is anxious about his discharge but there has been no management problem.  His PPD will be that tomorrow.    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  Psychiatric Specialty Exam: Review of Systems  Constitutional: Negative.  Negative for fever, chills, weight loss, malaise/fatigue and diaphoresis.  HENT: Negative for congestion and sore throat.   Eyes: Negative for blurred vision, double vision and photophobia.  Respiratory: Negative for cough, shortness of breath and wheezing.   Cardiovascular: Negative for chest pain, palpitations and PND.  Gastrointestinal: Negative for heartburn, nausea, vomiting, abdominal pain, diarrhea and constipation.  Musculoskeletal: Negative for myalgias, joint pain and falls.  Neurological: Positive for tremors. Negative for tingling, sensory change, speech change, focal weakness, seizures, loss of consciousness, weakness and headaches.  Endo/Heme/Allergies: Negative for polydipsia. Does not bruise/bleed easily.  Psychiatric/Behavioral: Positive for depression. Negative for hallucinations, memory loss and substance abuse. The patient is nervous/anxious. The patient does not have insomnia.     Blood pressure 90/59, pulse 91, temperature 97.6 F (36.4 C), temperature source Oral, resp. rate 24, height 4' 1.61" (1.26 m).There is no weight on file to calculate BMI.   General Appearance: Disheveled  Eye Solicitor::  Fair  Speech:  Clear and Coherent  Volume:  Normal  Mood:  Anxious  Affect:  Congruent  Thought Process:  Linear  Orientation:  Full (Time, Place, and Person)  Thought Content:  Negative  Suicidal Thoughts:  No  Homicidal Thoughts:  No  Memory:  Immediate;   Fair  Judgement:  Fair  Insight:  Shallow  Psychomotor Activity:  Restlessness and Tremor  Concentration:  Fair  Recall:  Fair  Akathisia:  No  Handed:  Right  AIMS (if indicated):     Assets:  Communication Skills Desire for Improvement  Sleep:  Number of Hours: 6   Current Medications: Current Facility-Administered Medications  Medication Dose Route Frequency Provider Last Rate Last Dose  . acetaminophen (TYLENOL) tablet 650 mg  650 mg Oral Q6H PRN Kerry Hough, PA   650 mg at 11/06/12 0839  . alum & mag hydroxide-simeth (MAALOX/MYLANTA) 200-200-20 MG/5ML suspension 30 mL  30 mL Oral Q4H PRN Kerry Hough, PA      . enoxaparin (LOVENOX) injection 80 mg  80 mg Subcutaneous BID Mojeed Akintayo   80 mg at 11/06/12 0759  . magnesium hydroxide (MILK OF MAGNESIA) suspension 30 mL  30 mL Oral Daily PRN Kerry Hough, PA      . mirtazapine (REMERON) tablet 15 mg  15 mg Oral QHS Mojeed Akintayo   15 mg at 11/05/12 2105  . multivitamin with minerals tablet 1 tablet  1 tablet Oral q morning - 10a Kerry Hough, PA   1 tablet at 11/06/12 0759  . nicotine (NICODERM CQ - dosed in mg/24 hours) patch  21 mg  21 mg Transdermal Q0600 Mojeed Akintayo   21 mg at 11/06/12 0757  . thiamine (B-1) injection 100 mg  100 mg Intramuscular Once Kerry Hough, PA      . thiamine (VITAMIN B-1) tablet 100 mg  100 mg Oral Daily Kerry Hough, PA   100 mg at 11/06/12 0759  . traZODone (DESYREL) tablet 50 mg  50 mg Oral QHS,MR X 1 Kerry Hough, PA   50 mg at 11/05/12 2105  . tuberculin injection 5 Units  5 Units Intradermal Once Verne Spurr, PA-C   5 Units at 11/04/12 1548  . warfarin  (COUMADIN) tablet 20 mg  20 mg Oral ONCE-1800 Malva Cogan, [facility]      . Warfarin - Pharmacist Dosing Inpatient   Does not apply q1800 Mojeed Akintayo        Lab Results:  Results for orders placed during the hospital encounter of 11/02/12 (from the past 48 hour(s))  PROTIME-INR     Status: None   Collection Time    11/05/12  6:28 AM      Result Value Range   Prothrombin Time 14.2  11.6 - 15.2 seconds   INR 1.11  0.00 - 1.49  PROTIME-INR     Status: None   Collection Time    11/06/12  6:45 AM      Result Value Range   Prothrombin Time 13.3  11.6 - 15.2 seconds   INR 1.02  0.00 - 1.49  CBC     Status: Abnormal   Collection Time    11/06/12  6:45 AM      Result Value Range   WBC 6.1  4.0 - 10.5 K/uL   RBC 4.36  4.22 - 5.81 MIL/uL   Hemoglobin 13.6  13.0 - 17.0 g/dL   HCT 78.2  95.6 - 21.3 %   MCV 98.4  78.0 - 100.0 fL   MCH 31.2  26.0 - 34.0 pg   MCHC 31.7  30.0 - 36.0 g/dL   RDW 08.6 (*) 57.8 - 46.9 %   Platelets 224  150 - 400 K/uL    Physical Findings: AIMS: Facial and Oral Movements Muscles of Facial Expression: None, normal Lips and Perioral Area: None, normal Jaw: None, normal Tongue: None, normal,Extremity Movements Upper (arms, wrists, hands, fingers): Mild Lower (legs, knees, ankles, toes): Mild, Trunk Movements Neck, shoulders, hips: None, normal, Overall Severity Severity of abnormal movements (highest score from questions above): Minimal Incapacitation due to abnormal movements: None, normal Patient's awareness of abnormal movements (rate only patient's report): Aware, no distress, Dental Status Current problems with teeth and/or dentures?: No Does patient usually wear dentures?: No  CIWA:  CIWA-Ar Total: 4 COWS:     Treatment Plan Summary: Daily contact with patient to assess and evaluate symptoms and progress in treatment Medication management  Plan: Continue crisis management and stabilization.  Continue Librium protocol. Continue current  psychiatric medication and encouragement to participate in group therapy. 1. Continue crisis management and stabilization. Teeth health problem as needed.  Developed a plan to decrease risk of relapse upon discharge and need of readmission.  Continue psychosocial education regarding relapse prevention of self care.  Continue followup on long-term inpatient rehabilitation.  At this time patient does not have any side effects from medication.  His PPD will be that tomorrow .    Medical Decision Making Problem Points:  Established problem, stable/improving (1) and Review of psycho-social stressors (1) Data Points:  Review of medication  regiment & side effects (2)  I certify that inpatient services furnished can reasonably be expected to improve the patient's condition.  Rona Ravens. Mashburn RPAC 1:25 PM 11/06/2012

## 2012-11-06 NOTE — Progress Notes (Signed)
ANTICOAGULATION CONSULT NOTE - Follow Up Consult  Pharmacy Consult for Coumadin Indication: DVT  No Known Allergies  Patient Measurements: Height: 4' 1.61" (126 cm) IBW/kg (Calculated) : 26.09  Vital Signs: BP: 90/59 mmHg (03/02 0701) Pulse Rate: 91 (03/02 0701)  Labs:  Recent Labs  11/04/12 0620 11/05/12 0628 11/06/12 0645  HGB  --   --  13.6  HCT  --   --  42.9  PLT  --   --  224  LABPROT 13.4 14.2 13.3  INR 1.03 1.11 1.02    The CrCl is unknown because both a height and weight (above a minimum accepted value) are required for this calculation.   Medications:  Scheduled:  . [COMPLETED] chlordiazePOXIDE  25 mg Oral BH-qamhs   Followed by  . [COMPLETED] chlordiazePOXIDE  25 mg Oral Daily  . enoxaparin (LOVENOX) injection  80 mg Subcutaneous BID  . mirtazapine  15 mg Oral QHS  . multivitamin with minerals  1 tablet Oral q morning - 10a  . nicotine  21 mg Transdermal Q0600  . thiamine  100 mg Intramuscular Once  . thiamine  100 mg Oral Daily  . traZODone  50 mg Oral QHS,MR X 1  . tuberculin  5 Units Intradermal Once  . warfarin  15 mg Oral Daily  . Warfarin - Pharmacist Dosing Inpatient   Does not apply q1800    Assessment: Patient's INR dropped to 1.02   Talked to patient and he has been taking his medication.  Eating salad and broccoli which may have some effect on INR.   No bleeding problems.  He told me he has taken as much as 20 mg Coumadin before.  Still on Levenox.  His Platelets are fine and actually a little higher than last CBC.  Goal of Therapy:  INR 2-3   Patient's Platelets are fine   Plan:  Will give Coumadin 20 mg today and recheck INR in AM  Pamala Duffel L 11/06/2012,9:00 AM

## 2012-11-06 NOTE — Progress Notes (Signed)
Psychoeducational Group Note  Date:  11/06/2012 Time:  0945 am  Group Topic/Focus:  Making Healthy Choices:   The focus of this group is to help patients identify negative/unhealthy choices they were using prior to admission and identify positive/healthier coping strategies to replace them upon discharge.  Participation Level:  Active  Participation Quality:  Appropriate  Affect:  Appropriate  Cognitive:  Appropriate  Insight:  Engaged  Engagement in Group:  Engaged  Additional Comments:    Andrena Mews 11/06/2012, 10:29 AM

## 2012-11-06 NOTE — Progress Notes (Signed)
Patient ID: [name], male   DOB: [birth year], 47 y.o.   MRN: [id number] Psychoeducational Group Note  Date:  11/06/2012 Time:  0930am  Group Topic/Focus:  Making Healthy Choices:   The focus of this group is to help patients identify negative/unhealthy choices they were using prior to admission and identify positive/healthier coping strategies to replace them upon discharge.  Participation Level:  Active  Participation Quality:  Appropriate  Affect:  Appropriate  Cognitive:  Appropriate  Insight:  Supportive  Engagement in Group:  Supportive  Additional Comments:  Inventory group   Valente David 11/06/2012,10:13 AM

## 2012-11-07 LAB — PROTIME-INR
INR: 1.05 (ref 0.00–1.49)
Prothrombin Time: 13.6 seconds (ref 11.6–15.2)

## 2012-11-07 MED ORDER — WARFARIN SODIUM 10 MG PO TABS
20.0000 mg | ORAL_TABLET | Freq: Once | ORAL | Status: AC
  Administered 2012-11-07: 20 mg via ORAL
  Filled 2012-11-07: qty 2

## 2012-11-07 NOTE — Progress Notes (Signed)
[facility] LCSW Aftercare Discharge Planning Group Note  11/07/2012 11:22 AM  Participation Quality:  Appropriate  Affect:  Anxious  Cognitive:  Oriented  Insight:  Improving  Engagement in Group:  Improving  Modes of Intervention:  Discussion, Exploration and Socialization  Summary of Progress/Problems: Demauri states that other than anxiety, his symptoms are gone, and he agrees the anxiety has to do with the next step from here, as well as some things he has no control over.  He is ready for the next step.  He and I discussed [facility], [facility] and [facility] as the 3 possibilities.  I assured him I would know by the end of the day what the plan would be.  Daryel Gerald B 11/07/2012, 11:22 AM

## 2012-11-07 NOTE — Progress Notes (Signed)
Patient ID: [name], male   DOB: 06-27-1966, 47 y.o.   MRN: [id number] [facility] MD Progress Note  11/07/2012 9:21 AM [name]  MRN:  [id number]  Subjective:"feeling a bunch of different feelings today. Not sure where I'm going, I've got lots of things to do, and it looks like [facility] and [facility] aren't going to work out."   Objective: Patient seen and chart reviewed. He is alert and active in the unit milieu. He is considering his options for discharge and states he can stay with a friend for a few days before he goes into rehab if a bed becomes available.  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  Psychiatric Specialty Exam: Review of Systems  Constitutional: Negative.  Negative for fever, chills, weight loss, malaise/fatigue and diaphoresis.  HENT: Negative for congestion and sore throat.   Eyes: Negative for blurred vision, double vision and photophobia.  Respiratory: Negative for cough, shortness of breath and wheezing.   Cardiovascular: Negative for chest pain, palpitations and PND.  Gastrointestinal: Negative for heartburn, nausea, vomiting, abdominal pain, diarrhea and constipation.  Musculoskeletal: Negative for myalgias, joint pain and falls.  Skin: Positive for rash (notes a mild rash to right forearm and legs ).  Neurological: Negative for tingling, tremors, sensory change, speech change, focal weakness, seizures, loss of consciousness, weakness and headaches.  Endo/Heme/Allergies: Negative for polydipsia. Does not bruise/bleed easily.  Psychiatric/Behavioral: Negative for depression, hallucinations, memory loss and substance abuse. The patient does not have insomnia. Nervous/anxious:  feeling lots of anxiety about where I'm going to go....     Blood pressure 101/49, pulse 84, temperature 97.5 F (36.4 C), temperature source Oral, resp. rate 24, height 4' 1.61" (1.26 m).There is no weight on file to calculate BMI.  General Appearance: Disheveled  Eye Solicitor::  Fair  Speech:  Clear and Coherent  Volume:  Normal  Mood:  Anxious  Affect:  Congruent  Thought Process:  Linear  Orientation:  Full (Time, Place, and Person)  Thought Content:  Negative  Suicidal Thoughts:  No  Homicidal Thoughts:  No  Memory:  Immediate;   Fair  Judgement:  Fair  Insight:  Shallow  Psychomotor Activity:  normal  Concentration:  Fair  Recall:  Fair  Akathisia:  No  Handed:  Right  AIMS (if indicated):     Assets:  Communication Skills Desire for Improvement  Sleep:  Number of Hours: 6.75   Current Medications: Current Facility-Administered Medications  Medication Dose Route Frequency Provider Last Rate Last Dose  . acetaminophen (TYLENOL) tablet 650 mg  650 mg Oral Q6H PRN Kerry Hough, PA   650 mg at 11/07/12 0809  . alum & mag hydroxide-simeth (MAALOX/MYLANTA) 200-200-20 MG/5ML suspension 30 mL  30 mL Oral Q4H PRN Kerry Hough, PA      . enoxaparin (LOVENOX) injection 80 mg  80 mg Subcutaneous BID Mojeed Akintayo   80 mg at 11/07/12 0810  . magnesium hydroxide (MILK OF MAGNESIA) suspension 30 mL  30 mL Oral Daily PRN Kerry Hough, PA      . mirtazapine (REMERON) tablet 15 mg  15 mg Oral QHS  Mojeed Akintayo   15 mg at 11/06/12 2128  . multivitamin with minerals tablet 1 tablet  1 tablet Oral q morning - 10a Kerry Hough, PA   1 tablet at 11/07/12 0810  . nicotine (NICODERM CQ - dosed in mg/24 hours) patch 21 mg  21 mg Transdermal Q0600 Mojeed Akintayo   21 mg at 11/07/12 0811  . thiamine (B-1) injection 100 mg  100 mg Intramuscular Once Kerry Hough, PA      . thiamine (VITAMIN B-1) tablet 100 mg  100 mg Oral Daily Kerry Hough, PA   100 mg at 11/07/12 0810  .  traZODone (DESYREL) tablet 50 mg  50 mg Oral QHS,MR X 1 Kerry Hough, PA   50 mg at 11/06/12 2128  . warfarin (COUMADIN) tablet 20 mg  20 mg Oral ONCE-1800 Mojeed Akintayo      . Warfarin - Pharmacist Dosing Inpatient   Does not apply q1800 Mojeed Akintayo        Lab Results:  Results for orders placed during the hospital encounter of 11/02/12 (from the past 48 hour(s))  PROTIME-INR     Status: None   Collection Time    11/06/12  6:45 AM      Result Value Range   Prothrombin Time 13.3  11.6 - 15.2 seconds   INR 1.02  0.00 - 1.49  CBC     Status: Abnormal   Collection Time    11/06/12  6:45 AM      Result Value Range   WBC 6.1  4.0 - 10.5 K/uL   RBC 4.36  4.22 - 5.81 MIL/uL   Hemoglobin 13.6  13.0 - 17.0 g/dL   HCT 16.1  09.6 - 04.5 %   MCV 98.4  78.0 - 100.0 fL   MCH 31.2  26.0 - 34.0 pg   MCHC 31.7  30.0 - 36.0 g/dL   RDW 40.9 (*) 81.1 - 91.4 %   Platelets 224  150 - 400 K/uL  PROTIME-INR     Status: None   Collection Time    11/07/12  6:10 AM      Result Value Range   Prothrombin Time 13.6  11.6 - 15.2 seconds   INR 1.05  0.00 - 1.49    Physical Findings: Minimal superficial excoriations to right forearm. AIMS: Facial and Oral Movements Muscles of Facial Expression: None, normal Lips and Perioral Area: None, normal Jaw: None, normal Tongue: None, normal,Extremity Movements Upper (arms, wrists, hands, fingers): Mild Lower (legs, knees, ankles, toes): Mild, Trunk Movements Neck, shoulders, hips: None, normal, Overall Severity Severity of abnormal movements (highest score from questions above): Minimal Incapacitation due to abnormal movements: None, normal Patient's awareness of abnormal movements (rate only patient's report): Aware, no distress, Dental Status Current problems with teeth and/or dentures?: No Does patient usually wear dentures?: No  CIWA:  CIWA-Ar Total: 3 COWS:     Treatment Plan Summary: Daily contact with patient to assess and evaluate symptoms  and progress in treatment Medication management  Plan: 1. Continue crisis management and stabilization. 2. Medication management to reduce current symptoms to base line and improve patient's overall level of functioning 3. Treat health problems as indicated. 4. Develop treatment plan to decrease risk of relapse upon discharge and the need for     readmission. 5. Psycho-social education regarding relapse prevention and self care. 6. Health care follow up as needed for medical problems. 7. Continue home medications where appropriate. 8. Patient will be discharged  out tomorrow and will follow up at [facility] for rehab bed. Medical Decision Making Problem Points:  Established problem, stable/improving (1) and Review of psycho-social stressors (1) Data Points:  Review of medication regiment & side effects (2)  I certify that inpatient services furnished can reasonably be expected to improve the patient's condition.  Rona Ravens. Mashburn RPAC 9:21 AM 11/07/2012

## 2012-11-07 NOTE — Progress Notes (Signed)
Patient resting quietly with eyes closed. Respirations even and unlabored. No distress noted . Q 15 minute check continues as ordered to maintain safety. 

## 2012-11-07 NOTE — Progress Notes (Signed)
ANTICOAGULATION CONSULT NOTE - Follow Up Consult  Pharmacy Consult for coumadin Indication: h/o dvt  No Known Allergies   Labs:  Recent Labs  11/05/12 0628 11/06/12 0645 11/07/12 0610  HGB  --  13.6  --   HCT  --  42.9  --   PLT  --  224  --   LABPROT 14.2 13.3 13.6  INR 1.11 1.02 1.05    The CrCl is unknown because both a height and weight (above a minimum accepted value) are required for this calculation.   Medications:  Prescriptions prior to admission  Medication Sig Dispense Refill  . Aspirin-Salicylamide-Caffeine (BC HEADACHE POWDER PO) Take 1 packet by mouth 2 (two) times daily as needed (for pain). Pain.      . Multiple Vitamin (MULTIVITAMIN WITH MINERALS) TABS Take 1 tablet by mouth every morning.       . warfarin (COUMADIN) 5 MG tablet Take 3 tablets (15 mg total) by mouth daily.  30 tablet  0    Assessment: INR still not increasing.  Patient received 20 mg x 1 on 11/07/12.  No problems noted with therapy.   Goal of Therapy:  INR 2-3    Plan:  Coumadin 20 mg po x 1 today at 1800. PT/INR in am  F/u in am   Charyl Dancer 11/07/2012,7:17 AM

## 2012-11-07 NOTE — Progress Notes (Signed)
Adult Psychoeducational Group Note  Date:  11/07/2012 Time:  11:00am  Group Topic/Focus:  Wellness Toolbox:   The focus of this group is to discuss various aspects of wellness, balancing those aspects and exploring ways to increase the ability to experience wellness.  Patients will create a wellness toolbox for use upon discharge.  Participation Level:  Active  Participation Quality:  Appropriate, Sharing and Supportive  Affect:  Appropriate  Cognitive:  Appropriate  Insight: Appropriate  Engagement in Group:  Engaged and Supportive  Modes of Intervention:  Education and Support  Additional Comments:  none  Ashlin Kreps M 11/07/2012, 12:44 PM 

## 2012-11-07 NOTE — Progress Notes (Signed)
D. Pt has been up this evening and visible in milieu, pt interacted in milieu activity this evening. Pt spoke about going to be discharged tomorrow and spoke about getting into [facility] in a few days. Pt states that he is feeling better and feels ready to be discharged. Pt denies SI/HI and received all medications this evening without incident. A. Support and encouragement provided. R. Will continue to monitor.

## 2012-11-07 NOTE — Clinical Social Work Note (Signed)
  Type of Therapy: Process Group Therapy  Participation Level:  Active  Participation Quality:  Appropriate  Affect:  Sarcastic  Cognitive:  Oriented  Insight:  Improving  Engagement in Group:  Improving  Engagement in Therapy:  Improving  Modes of Intervention:  Activity, Clarification, Education, Problem-solving and Support  Summary of Progress/Problems: Today's group addressed the issue of overcoming obstacles.  Patients were asked to identify their biggest obstacle post d/c that stands in the way of their on-going success, and then problem solve as to how to manage this. Flor identified living in the moment as his biggest obstacle.  He tends to get wrapped up in the past or planning for the future, and he knows to stay clean and sober he needs to work on this.  He feels confident that by getting into rehab, he will have the support he needs to get better at focusing on the here and now.       Daryel Gerald B 11/07/2012   2:29 PM

## 2012-11-07 NOTE — Progress Notes (Signed)
Patient ID: [name], male   DOB: [birth year], 47 y.o.   MRN: [id number] Patient remains hopeful about going to a long term facility for his alcohol treatment.  He has been declined at [facility] and [facility] due the medical condition; history of DVT.  At this point, plans for discharge are to go to [facility] and possibly get into [facility].  Patient is not exhibiting any signs of withdrawal; he continues to have a slight tremor in the hands.  Patient denies any SI/HI/AVH at this time.  He remains with flat affect; his speech is logical/coherent; eye contact good; thought processes intact.    Continue to monitor medication management and MD orders.  Safety checks continued every 15 minutes per protocol.  Patient's behavior has been appropriate on the milieu.

## 2012-11-07 NOTE — Progress Notes (Signed)
Recreation Therapy Notes   Date: 03.03.2014        Time: 9:30am Location: [address] Day Room      Group Topic/Focus: Wellness  Participation Level: Active  Participation Quality: Appropriate  Affect: Euthymic  Cognitive: Appropriate   Additional Comments: Patient played Health and Wellness trivia game. Trivia questions were asked from one of the following five categories: Fruits, Fitness, Veggies, Exercises, Comptroller. Trivia game required patients to divide themselves into two equal teams. All members of the team were required to agree on the category and the answer. Patient with peers chose name "Team Awesome" as their team name. Patient with peers successfully answered questions from each category. Patient interacted with peers appropriately. Patient asked for clarification when needed. Patient and male peer very chatty LRT had to ask patient and peer to contain their talking and laughter so we could finish wrap up of group activity.   Jearl Klinefelter, LRT/CTRS  [employer], LRT/CTRS   Jearl Klinefelter 11/07/2012 12:09 PM
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
## 2012-11-08 MED ORDER — WARFARIN SODIUM 7.5 MG PO TABS
15.0000 mg | ORAL_TABLET | Freq: Once | ORAL | Status: DC
  Filled 2012-11-08 (×2): qty 2

## 2012-11-08 MED ORDER — TRAZODONE HCL 50 MG PO TABS: 50.0000 mg | ORAL_TABLET | Freq: Every evening | ORAL | Status: DC | PRN

## 2012-11-08 MED ORDER — WARFARIN SODIUM 5 MG PO TABS: 15.0000 mg | ORAL_TABLET | Freq: Every day | ORAL | Status: DC

## 2012-11-08 MED ORDER — MIRTAZAPINE 15 MG PO TABS: 15.0000 mg | ORAL_TABLET | Freq: Every day | ORAL | Status: DC

## 2012-11-08 MED ORDER — WARFARIN SODIUM 5 MG PO TABS
25.0000 mg | ORAL_TABLET | Freq: Once | ORAL | Status: DC
  Filled 2012-11-08 (×2): qty 5

## 2012-11-08 NOTE — Progress Notes (Signed)
ANTICOAGULATION CONSULT NOTE - Follow Up Consult  Pharmacy Consult for Coumadin Indication: DVT  No Known Allergies  Patient Measurements: Height: 4' 1.61" (126 cm) IBW/kg (Calculated) : 26.09   Vital Signs: Temp: 97.7 F (36.5 C) (03/04 0747) Temp src: Oral (03/04 0747) BP: 125/86 mmHg (03/04 0748) Pulse Rate: 81 (03/04 0748)  Labs:  Recent Labs  11/06/12 0645 11/07/12 0610 11/08/12 0630  HGB 13.6  --   --   HCT 42.9  --   --   PLT 224  --   --   LABPROT 13.3 13.6 13.3  INR 1.02 1.05 1.02    The CrCl is unknown because both a height and weight (above a minimum accepted value) are required for this calculation.   Medications:  Prescriptions prior to admission  Medication Sig Dispense Refill  . Aspirin-Salicylamide-Caffeine (BC HEADACHE POWDER PO) Take 1 packet by mouth 2 (two) times daily as needed (for pain). Pain.      . Multiple Vitamin (MULTIVITAMIN WITH MINERALS) TABS Take 1 tablet by mouth every morning.       . warfarin (COUMADIN) 5 MG tablet Take 3 tablets (15 mg total) by mouth daily.  30 tablet  0    Assessment: INR not moving at all with dose of Coumadin 20 x 2 days given.  Patient currently on Lovenox until INR is therapeutic.   Patient has not had an increase in INR with previous dosing.     Goal of Therapy:  INR 2-3    Plan:  Coumadin 25 mg po x 1 today.  Recommend to resume home dosing of 15 mg daily upon discharge.  Continue lovenox for now as INR still well below 2.0.  Reinforce with patient diet limitations.     Charyl Dancer 11/08/2012,9:47 AM

## 2012-11-08 NOTE — Discharge Summary (Signed)
Physician Discharge Summary Note  Patient:  [name] is an 47 y.o., male MRN:  [id number] DOB:  Oct 16, 1965 Patient phone:  [phone] (home)  Patient address:   [place] [address],   Date of Admission:  11/02/2012 Date of Discharge: 11/08/2012  Reason for Admission:  Alcohol withdrawal  Discharge Diagnoses: Principal Problem:   Alcohol withdrawal Active Problems:   Major depressive disorder, recurrent episode, severe, without mention of psychotic behavior Discharge Diagnoses:  AXIS I: Alcohol dependence/Alcohol withdrawal  MDD-recurrent episode severe  AXIS II: Deferred  AXIS III:  Past Medical History   Diagnosis  Date   .  Coronary artery disease    .  Degenerative joint disease of spine    .  Pulmonary embolism  06/2010   .  Degenerative joint disease    .  Lupus anticoagulant disorder  02/02/2012   .  DVT (deep venous thrombosis)    .  Hepatitis B    AXIS IV: economic problems, housing problems, occupational problems, other psychosocial or environmental problems, problems related to legal system/crime and problems related to social environment  AXIS V: 61-70 mild symptoms Review of Systems  Constitutional: Negative.  Negative for fever, chills, weight loss, malaise/fatigue and diaphoresis.  HENT: Negative for congestion and sore throat.   Eyes: Negative for blurred vision, double vision and photophobia.  Respiratory: Negative for cough, shortness of breath and wheezing.   Cardiovascular: Negative for chest pain, palpitations and PND.  Gastrointestinal: Negative for heartburn, nausea, vomiting, abdominal pain, diarrhea and constipation.  Musculoskeletal: Negative for myalgias, joint pain and falls.  Neurological: Negative for dizziness, tingling, tremors, sensory change, speech change, focal weakness, seizures, loss of consciousness, weakness and headaches.  Endo/Heme/Allergies: Negative for polydipsia. Does not bruise/bleed easily.   Psychiatric/Behavioral: Negative for depression, suicidal ideas, hallucinations, memory loss and substance abuse. The patient is not nervous/anxious and does not have insomnia.     Level of Care:  [facility]  Hospital Course:  [name] presented to the ED requesting assistance with alcohol detox. He has a long history of alcohol abuse and notes he only becomes suicidal when he is intoxicated.  He was given medical clearance and transferred to [facility] for stabilization, detox, and crisis management.  He was placed on the 400 unit due to his history of psychosis many years ago, but stated that he is not psychotic and hasn't been in years.       [name] was treated with the standard Librium detox protocol. His regular medications were continued.  He was evaluated daily by clinical providers to assess his response to treatment and progress.  [name] was offered further alcohol treatment in the form of residential treatment upon discharge and he was interested in this.  Several facilities were petitioned, however due to his history of DVT he was not accepted into [facility] or [facility].  [name] reported decreasing symptoms each day and responded well to treatment. He did continue to note anxiety as his discharge approached related to the number of tasks he needed to accomplish related to his quest for disability. However, on the day of discharge he was alert and oriented x 3, denied SI/HI, denied AVH and was in full touch with reality.  He was notified that a bed would be available at [facility] residential treatment facility for him on Thursday and was given information on how to follow up there.   Consults:  None  Significant Diagnostic Studies:  labs: please see all labs associated with this admission via EMR  Discharge Vitals:   Blood pressure 125/86, pulse 81, temperature 97.7 F (36.5 C), temperature source Oral, resp. rate 20, height 4' 1.61" (1.26 m). There is no weight on file to calculate BMI. Lab Results:   Results for  orders placed during the hospital encounter of 11/02/12 (from the past 72 hour(s))  PROTIME-INR     Status: None   Collection Time    11/06/12  6:45 AM      Result Value Range   Prothrombin Time 13.3  11.6 - 15.2 seconds   INR 1.02  0.00 - 1.49  CBC     Status: Abnormal   Collection Time    11/06/12  6:45 AM      Result Value Range   WBC 6.1  4.0 - 10.5 K/uL   RBC 4.36  4.22 - 5.81 MIL/uL   Hemoglobin 13.6  13.0 - 17.0 g/dL   HCT 16.1  09.6 - 04.5 %   MCV 98.4  78.0 - 100.0 fL   MCH 31.2  26.0 - 34.0 pg   MCHC 31.7  30.0 - 36.0 g/dL   RDW 40.9 (*) 81.1 - 91.4 %   Platelets 224  150 - 400 K/uL  PROTIME-INR     Status: None   Collection Time    11/07/12  6:10 AM      Result Value Range   Prothrombin Time 13.6  11.6 - 15.2 seconds   INR 1.05  0.00 - 1.49  PROTIME-INR     Status: None   Collection Time    11/08/12  6:30 AM      Result Value Range   Prothrombin Time 13.3  11.6 - 15.2 seconds   INR 1.02  0.00 - 1.49    Physical Findings: AIMS: Facial and Oral Movements Muscles of Facial Expression: None, normal Lips and Perioral Area: None, normal Jaw: None, normal Tongue: None, normal,Extremity Movements Upper (arms, wrists, hands, fingers): Mild Lower (legs, knees, ankles, toes): Mild, Trunk Movements Neck, shoulders, hips: None, normal, Overall Severity Severity of abnormal movements (highest score from questions above): Minimal Incapacitation due to abnormal movements: None, normal Patient's awareness of abnormal movements (rate only patient's report): Aware, no distress, Dental Status Current problems with teeth and/or dentures?: No Does patient usually wear dentures?: No  CIWA:  CIWA-Ar Total: 3 COWS:     Psychiatric Specialty Exam: See Psychiatric Specialty Exam and Suicide Risk Assessment completed by Attending Physician prior to discharge.  Discharge destination:  [facility]  Is patient on multiple antipsychotic therapies at discharge:  No   Has Patient had three  or more failed trials of antipsychotic monotherapy by history:  No  Recommended Plan for Multiple Antipsychotic Therapies:  Not applicable   Future Appointments Provider Department Dept Phone   11/17/2012 10:00 AM Windell Hummingbird [facility] [phone]   11/17/2012 10:30 AM Exie Parody, MD [place] [facility] [phone]       Medication List    STOP taking these medications       BC HEADACHE POWDER PO     multivitamin with minerals Tabs      TAKE these medications     Indication   mirtazapine 15 MG tablet  Commonly known as:  REMERON  Take 1 tablet (15 mg total) by mouth at bedtime. For insomnia.   Indication:  Trouble Sleeping, Major Depressive Disorder     traZODone 50 MG tablet  Commonly known as:  DESYREL  Take 1  tablet (50 mg total) by mouth at bedtime and may repeat dose one time if needed. For insomnia.   Indication:  Trouble Sleeping     warfarin 5 MG tablet  Commonly known as:  COUMADIN  Take 3 tablets (15 mg total) by mouth daily. For anticoagulation.   Indication:  Blood Clot in a Deep Vein           Follow-up Information   Follow up with [facility] On 11/10/2012. (Assessment at [facility] Thursday for possible admission.  Call [facility] on Wednesday to let them know that you need a ride from the shelter Thursday morning, and they will pick you up)    Contact information:   [address]  [phone]      Follow up with Victorino Dike is your contact at [facility].  Her Direct # is [phone].      Follow-up recommendations:  As noted above  Comments:   Total Discharge Time:  Greater than 30 minutes.  Signed: Rona Ravens. M[place]burn RPAC 1:26 PM 11/08/2012

## 2012-11-08 NOTE — Progress Notes (Addendum)
Patient ID: [name], male   DOB: 07/26/1966, 47 y.o.   MRN: [id number] Patient to be discharged today per MD order.  Patient plans to take bus transportation to the [employer].  He hopes to be able to get into [facility] later this week.  He denies any SI/HI/AVH.  Patient c/o pain in his right hip travelling down his leg; tylenol was administered with decrease in symptoms.  Patient is not exhibiting any withdrawal signs; his hands remain slightly tremulous.    Paperwork for discharge initiated.  CW to give patient a bus pass for transportation.  Safety checks continued every 15 minutes per protocol.  Patient's behavior has been appropriate.  1300: Patient discharged with bus transportation.  Patient will report to shelter in [facility] and follow up with [facility] on Thursday.  Patient denies SI/HI/AVH.  Patient discharged without incident.

## 2012-11-08 NOTE — BHH Suicide Risk Assessment (Signed)
Suicide Risk Assessment  Discharge Assessment     Demographic Factors:  Male, Low socioeconomic status and Unemployed  Mental Status Per Nursing Assessment::   On Admission:  NA  Current Mental Status by Physician: patient denies suicidal ideation,intent or plan  Loss Factors: Financial problems/change in socioeconomic status  Historical Factors: NA  Risk Reduction Factors:   Positive coping skills or problem solving skills  Continued Clinical Symptoms:  Alcohol/Substance Abuse/Dependencies  Cognitive Features That Contribute To Risk:  Closed-mindedness    Suicide Risk:  Minimal: No identifiable suicidal ideation.  Patients presenting with no risk factors but with morbid ruminations; may be classified as minimal risk based on the severity of the depressive symptoms  Discharge Diagnoses:   AXIS I: Alcohol dependence/Alcohol withdrawal             MDD-recurrent episode severe  AXIS II:  Deferred AXIS III:   Past Medical History  Diagnosis Date  . Coronary artery disease   . Degenerative joint disease of spine   . Pulmonary embolism 06/2010  . Degenerative joint disease   . 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 DVT (deep venous thrombosis)   . Hepatitis B    AXIS IV:  economic problems, housing problems, occupational problems, other psychosocial or environmental problems, problems related to legal system/crime and problems related to social environment AXIS V:  61-70 mild symptoms  Plan Of Care/Follow-up recommendations:  Activity:  as tolerated Diet:  healthy Tests:  routine blood test. Other:  patient to keep his after care appointment.  Is patient on multiple antipsychotic therapies at discharge:  No   Has Patient had three or more failed trials of antipsychotic monotherapy by history:  No  Recommended Plan for Multiple Antipsychotic Therapies: N/A  Sloan Takagi,MD 11/08/2012, 10:18 AM

## 2012-11-08 NOTE — Progress Notes (Signed)
[facility] Adult Case Management Discharge Plan :  Will you be returning to the same living situation after discharge: No. At discharge, do you have transportation home?:Yes,  given bus pass Do you have the ability to pay for your medications:Yes,  mental health  Release of information consent forms completed and in the chart;  Patient's signature needed at discharge.  Patient to Follow up at: Follow-up Information   Follow up with [facility] On 11/10/2012. (Assessment at [facility] Thursday for possible admission.  Call [facility] on Wednesday to let them know that you need a ride from the shelter Thursday morning, and they will pick you up)    Contact information:   [address]  [phone]      Follow up with Victorino Dike is your contact at [facility].  Her Direct # is [phone].      Patient denies SI/HI:   Yes,  yes    Safety Planning and Suicide Prevention discussed:  Yes,  yes  Daryel Gerald B 11/08/2012, 10:07 AM

## 2012-11-10 NOTE — Discharge Summary (Signed)
Seen and agreed. Rimsha Trembley, MD 

## 2012-11-11 NOTE — Progress Notes (Signed)
Patient Discharge Instructions:  After Visit Summary (AVS):   Faxed to:  11/11/12 Discharge Summary Note:   Faxed to:  11/11/12 Psychiatric Admission Assessment Note:   Faxed to:  11/11/12 Suicide Risk Assessment - Discharge Assessment:   Faxed to:  11/11/12 Faxed/Sent to the Next Level Care provider:  11/11/12 Faxed to [facility] @ [phone]  Jerelene Redden, 11/11/2012, 4:00 PM

## 2012-11-17 ENCOUNTER — Ambulatory Visit: Payer: Self-pay | Admitting: Oncology

## 2012-11-17 ENCOUNTER — Other Ambulatory Visit: Payer: Self-pay | Admitting: Lab

## 2012-12-10 ENCOUNTER — Encounter (HOSPITAL_COMMUNITY): Payer: Self-pay | Admitting: Emergency Medicine

## 2012-12-10 ENCOUNTER — Emergency Department (HOSPITAL_COMMUNITY)
Admission: EM | Admit: 2012-12-10 | Discharge: 2012-12-11 | Disposition: A | Discharge status: Home / Self Care | Payer: Medicaid Other | Attending: Emergency Medicine | Admitting: Emergency Medicine

## 2012-12-10 DIAGNOSIS — Z86718 Personal history of other venous thrombosis and embolism: Secondary | ICD-10-CM | POA: Insufficient documentation

## 2012-12-10 DIAGNOSIS — Z86711 Personal history of pulmonary embolism: Secondary | ICD-10-CM | POA: Insufficient documentation

## 2012-12-10 DIAGNOSIS — Z7901 Long term (current) use of anticoagulants: Secondary | ICD-10-CM | POA: Insufficient documentation

## 2012-12-10 DIAGNOSIS — F329 Major depressive disorder, single episode, unspecified: Secondary | ICD-10-CM | POA: Insufficient documentation

## 2012-12-10 DIAGNOSIS — Z8611 Personal history of tuberculosis: Secondary | ICD-10-CM | POA: Insufficient documentation

## 2012-12-10 DIAGNOSIS — F17200 Nicotine dependence, unspecified, uncomplicated: Secondary | ICD-10-CM | POA: Insufficient documentation

## 2012-12-10 DIAGNOSIS — R05 Cough: Secondary | ICD-10-CM | POA: Insufficient documentation

## 2012-12-10 DIAGNOSIS — I2510 Atherosclerotic heart disease of native coronary artery without angina pectoris: Secondary | ICD-10-CM | POA: Insufficient documentation

## 2012-12-10 DIAGNOSIS — R42 Dizziness and giddiness: Secondary | ICD-10-CM | POA: Insufficient documentation

## 2012-12-10 DIAGNOSIS — R0602 Shortness of breath: Secondary | ICD-10-CM | POA: Insufficient documentation

## 2012-12-10 DIAGNOSIS — R269 Unspecified abnormalities of gait and mobility: Secondary | ICD-10-CM | POA: Insufficient documentation

## 2012-12-10 DIAGNOSIS — R059 Cough, unspecified: Secondary | ICD-10-CM | POA: Insufficient documentation

## 2012-12-10 DIAGNOSIS — Z8739 Personal history of other diseases of the musculoskeletal system and connective tissue: Secondary | ICD-10-CM | POA: Insufficient documentation

## 2012-12-10 DIAGNOSIS — F3289 Other specified depressive episodes: Secondary | ICD-10-CM | POA: Insufficient documentation

## 2012-12-10 DIAGNOSIS — Z79899 Other long term (current) drug therapy: Secondary | ICD-10-CM | POA: Insufficient documentation

## 2012-12-10 DIAGNOSIS — R609 Edema, unspecified: Secondary | ICD-10-CM | POA: Insufficient documentation

## 2012-12-10 LAB — CBC
HCT: 38 % — ABNORMAL LOW (ref 39.0–52.0)
MCH: 31.5 pg (ref 26.0–34.0)
MCV: 95 fL (ref 78.0–100.0)
Platelets: 143 10*3/uL — ABNORMAL LOW (ref 150–400)
RBC: 4 MIL/uL — ABNORMAL LOW (ref 4.22–5.81)
WBC: 6.2 10*3/uL (ref 4.0–10.5)

## 2012-12-10 NOTE — ED Notes (Signed)
Pt currently lying in floor in WR, refusing to sit in WR or WR chair

## 2012-12-10 NOTE — ED Provider Notes (Signed)
History     CSN: [id number]  Arrival date & time 12/10/12  2033   First MD Initiated Contact with Patient 12/10/12 2226      Chief Complaint  Patient presents with  . Generalized Body Aches    (Consider location/radiation/quality/duration/timing/severity/associated sxs/prior treatment) HPI Comments: 47 y.o. Male complaining of 3 days of 10/10 pain to his left calf. PMHx of DVT, PE, CAD. Pt has IVF (2011) and has been off his coumdin (15mg  daily) for 2 weeks. Pt states hurts more when he walks, is relieved with rest. Admits coughing up blood earlier today, hematemesis, shortness of breath, feeling dizzy.   Denies dyspnea, chest pain.   Pt is homeless and non-compliant with his medication/follow up appointments. States he has been off his coumadin because he missed his doctor appointment and could not get there.   PMHx includes alcohol abuse, depressive disorder.    Past Medical History  Diagnosis Date  . Coronary artery disease   . Degenerative joint disease of spine   . Pulmonary embolism 06/2010  . Degenerative joint disease   . 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 ETOH abuse   . DVT (deep venous thrombosis)   . Hepatitis B   . Mental disorder   . Depression     Past Surgical History  Procedure Laterality Date  . Left elbow surgery    . Tonsillectomy      History reviewed. No pertinent family history.  History  Substance Use Topics  . Smoking status: Current Every Day Smoker -- 3.00 packs/day for 30 years    Types: Cigarettes  . Smokeless tobacco: Never Used  . Alcohol Use: Yes     Comment: heavy drinker daily      Review of Systems  Constitutional: Negative for fever and diaphoresis.  HENT: Negative for neck pain and neck stiffness.   Eyes: Negative for visual disturbance.  Respiratory: Positive for cough and shortness of breath. Negative for apnea and chest tightness.        Hemoptysis  Cardiovascular: Negative for chest pain and palpitations.   Gastrointestinal: Positive for vomiting. Negative for nausea, diarrhea and constipation.       Hematemesis  Musculoskeletal: Positive for back pain and gait problem.       Left lower calf pain, hurts to walk Chronic back pain  Skin: Positive for color change.       Discoloration to left lower extremity  Neurological: Positive for dizziness. Negative for weakness, light-headedness, numbness and headaches.    Allergies  Review of patient's allergies indicates no known allergies.  Home Medications   Current Outpatient Rx  Name  Route  Sig  Dispense  Refill  . Aspirin-Salicylamide-Caffeine (BC HEADACHE PO)   Oral   Take 1 Package by mouth 2 (two) times daily as needed (for pain).         Marland Kitchen diphenhydrAMINE (BENADRYL) 25 MG tablet   Oral   Take 50 mg by mouth daily as needed for sleep.         . mirtazapine (REMERON) 15 MG tablet   Oral   Take 1 tablet (15 mg total) by mouth at bedtime. For insomnia.   30 tablet   0   . traZODone (DESYREL) 50 MG tablet   Oral   Take 50 mg by mouth at bedtime and may repeat dose one time if needed. For insomnia.         Marland Kitchen warfarin (COUMADIN) 5 MG tablet   Oral   Take 20  mg by mouth daily. For anticoagulation.           BP 111/69  Pulse 96  Temp(Src) 98 F (36.7 C) (Oral)  Resp 18  SpO2 100%  Physical Exam  Nursing note and vitals reviewed. Constitutional: He is oriented to person, place, and time. No distress.  dishevled  HENT:  Head: Normocephalic and atraumatic.  Eyes: Conjunctivae and EOM are normal.  Neck: Normal range of motion. Neck supple.  No meningeal signs  Cardiovascular: Normal rate, regular rhythm, normal heart sounds and intact distal pulses.  Exam reveals no gallop and no friction rub.   No murmur heard. Pulmonary/Chest: Effort normal. No respiratory distress. He has no wheezes. He has no rales. He exhibits tenderness.  Coarse breath sounds bilaterally, reproducible substernal chest pain  Abdominal: Soft.  Bowel sounds are normal. He exhibits no distension. There is no tenderness. There is no rebound and no guarding.  Musculoskeletal: Normal range of motion. He exhibits edema and tenderness.  Edema to left lower extremity.  Neurological: He is alert and oriented to person, place, and time. No cranial nerve deficit.  No focal deficits  Skin: Skin is warm and dry. He is not diaphoretic.  Darkened left shin    ED Course  Procedures (including critical care time)  Dg Chest 2 View  12/11/2012  *RADIOLOGY REPORT*  Clinical Data: Chest pain and coronary artery disease.  CHEST - 2 VIEW  Comparison: 10/17/2012  Findings: Emphysematous changes and scattered fibrosis in the lungs.  Peribronchial thickening consistent with chronic bronchitis.  No focal consolidation.  No blunting of costophrenic angles.  No pneumothorax.  Calcified granuloma in the left midlung. Normal heart size and pulmonary vascularity.  Stable appearance since previous study.  IMPRESSION: No evidence of active pulmonary disease.  Stable chronic changes since previous study.   Original Report Authenticated By: Burman Nieves, M.D.      No diagnosis at time of transfer of care.     MDM  Consider DVT/PE. IVF in place. Vitals stable. Discussed case with Dr. Ranae Palms who saw the pt as well. Will order labs, INR, check occult blood, get CXR and lower extremity venous doppler.   Test results pending at time of transfer of care. CXR shows stable chronic changes since previous study and no evidence active pulmonary disease.   Transfer of care to [facility], PA-C  [place] Nurse, [place] 12/11/12 0100

## 2012-12-10 NOTE — ED Notes (Signed)
[place] [facility] [place] c/o gereralized body pain, LE edema, hematemesis. No vomiting noted by EMS

## 2012-12-11 ENCOUNTER — Emergency Department (HOSPITAL_COMMUNITY): Payer: Medicaid Other

## 2012-12-11 ENCOUNTER — Encounter (HOSPITAL_COMMUNITY): Payer: Self-pay | Admitting: Radiology

## 2012-12-11 LAB — POCT I-STAT, CHEM 8
BUN: 6 mg/dL (ref 6–23)
Calcium, Ion: 0.99 mmol/L — ABNORMAL LOW (ref 1.12–1.23)
Chloride: 104 mEq/L (ref 96–112)
Glucose, Bld: 52 mg/dL — ABNORMAL LOW (ref 70–99)
TCO2: 28 mmol/L (ref 0–100)

## 2012-12-11 LAB — GLUCOSE, CAPILLARY
Glucose-Capillary: 81 mg/dL (ref 70–99)
Glucose-Capillary: 72 mg/dL (ref 70–99)

## 2012-12-11 LAB — OCCULT BLOOD, POC DEVICE: Fecal Occult Bld: NEGATIVE

## 2012-12-11 MED ORDER — IOHEXOL 350 MG/ML SOLN
100.0000 mL | Freq: Once | INTRAVENOUS | Status: AC | PRN
  Administered 2012-12-11: 100 mL via INTRAVENOUS

## 2012-12-11 MED ORDER — WARFARIN SODIUM 5 MG PO TABS: 15.0000 mg | ORAL_TABLET | Freq: Every day | ORAL | Status: DC

## 2012-12-11 NOTE — ED Provider Notes (Addendum)
Presents with left lower extremity swelling and pain onset yesterday patient also reports that he coughed up "red stuff" yesterday" he's been noncompliant with his Coumadin for 3 weeks. On exam patient is in no distress lungs clear auscultation heart regular rate and rhythm abdomen. All 4 extremities nontender neurovascularly intact left lower extremity with minimal nonpitting edema below the knee. Noninvasive Doppler of the left lower extremity negative for DVT 10:30 AM patient resting comfortably. no distress plan prescription Coumadin Followup Dr. Gaylyn Rong this week to get INR rechecked Results for orders placed during the hospital encounter of 12/10/12  CBC      Result Value Range   WBC 6.2  4.0 - 10.5 K/uL   RBC 4.00 (*) 4.22 - 5.81 MIL/uL   Hemoglobin 12.6 (*) 13.0 - 17.0 g/dL   HCT 44.0 (*) 10.2 - 72.5 %   MCV 95.0  78.0 - 100.0 fL   MCH 31.5  26.0 - 34.0 pg   MCHC 33.2  30.0 - 36.0 g/dL   RDW 36.6 (*) 44.0 - 34.7 %   Platelets 143 (*) 150 - 400 K/uL  PROTIME-INR      Result Value Range   Prothrombin Time 11.9  11.6 - 15.2 seconds   INR 0.88  0.00 - 1.49  POCT I-STAT, CHEM 8      Result Value Range   Sodium 141  135 - 145 mEq/L   Potassium 4.5  3.5 - 5.1 mEq/L   Chloride 104  96 - 112 mEq/L   BUN 6  6 - 23 mg/dL   Creatinine, Ser 4.25  0.50 - 1.35 mg/dL   Glucose, Bld 52 (*) 70 - 99 mg/dL   Calcium, Ion 9.56 (*) 1.12 - 1.23 mmol/L   TCO2 28  0 - 100 mmol/L   Hemoglobin 13.9  13.0 - 17.0 g/dL   HCT 38.7  56.4 - 33.2 %   Dg Chest 2 View  12/11/2012  *RADIOLOGY REPORT*  Clinical Data: Chest pain and coronary artery disease.  CHEST - 2 VIEW  Comparison: 10/17/2012  Findings: Emphysematous changes and scattered fibrosis in the lungs.  Peribronchial thickening consistent with chronic bronchitis.  No focal consolidation.  No blunting of costophrenic angles.  No pneumothorax.  Calcified granuloma in the left midlung. Normal heart size and pulmonary vascularity.  Stable appearance since  previous study.  IMPRESSION: No evidence of active pulmonary disease.  Stable chronic changes since previous study.   Original Report Authenticated By: Burman Nieves, M.D.    Ct Angio Chest Pe W/cm &/or Wo Cm  12/11/2012  *RADIOLOGY REPORT*  Clinical Data: Hemoptysis.  Evaluate for pulmonary embolus.  CT ANGIOGRAPHY CHEST  Technique:  Multidetector CT imaging of the chest using the standard protocol during bolus administration of intravenous contrast. Multiplanar reconstructed images including MIPs were obtained and reviewed to evaluate the vascular anatomy.  Contrast: OMNIPAQUE IOHEXOL 350 MG/ML SOLN  Comparison: 03/28/2012  Findings:  Lungs/pleura: There is no pleural effusion identified.  There is a calcified granuloma in the superior segment of the left lower lobe. Mild architectural distortion within the medial right upper lobe. Scarring noted within the periphery of the right lower lobe.  There is no airspace consolidation.  No interstitial edema.  Mild changes of centrilobular emphysema.  Heart/Mediastinum: Normal heart size.  There is no pericardial effusion.  The main pulmonary artery is patent.  There are no abnormal filling defects noted within the lumbar or segmental pulmonary arteries to suggest a clinically significant pulmonary embolus.  No mediastinal or hilar adenopathy.  Upper abdomen: There is mild fatty infiltration of the liver.  The adrenal glands are both within normal limits.  Bones/Musculoskeletal:  Review of the visualized osseous structures is significant for mild thoracic spondylosis.  IMPRESSION:  1.  No evidence for acute pulmonary embolus. 2.  Mild emphysema. 3.  Prior granulomatous disease   Original Report Authenticated By: Signa Kell, M.D.   Note: pt to be started on coumadin 15 mg daily as he states that was his most recent RX Dx #1 peripheral edema #2 hemoptysis #3 tobacco abuse  # 4 medication non compliance  Doug Sou, MD 12/11/12 1035  Doug Sou, MD 12/11/12 1038

## 2012-12-11 NOTE — ED Notes (Signed)
Pt verbalized understanding of d/c information, importance to adhering to coumadin regimen and contacting dr. Gaylyn Rong for f/u visit, inr, and refills. Pt given a bus pass for transportation.

## 2012-12-11 NOTE — ED Notes (Signed)
Patient transported to Ultrasound 

## 2012-12-11 NOTE — Progress Notes (Signed)
*  PRELIMINARY RESULTS* Vascular Ultrasound Left lower extremity venous duplex has been completed.  Preliminary findings: Left = no evidence of DVT. The venous flow of the left common femoral is continuous compared to the right common femoral, which is phasic. This could indicate an obstruction more proximal on the left side, possibly in the left iliac vein.  Farrel Demark, RDMS, RVT  12/11/2012, 8:36 AM

## 2012-12-12 NOTE — ED Provider Notes (Signed)
Medical screening examination/treatment/procedure(s) were conducted as a shared visit with non-physician practitioner(s) and myself.  I personally evaluated the patient during the encounter   Corinna Burkman, MD 12/12/12 0534 

## 2012-12-15 ENCOUNTER — Telehealth: Payer: Self-pay | Admitting: Oncology

## 2012-12-17 ENCOUNTER — Inpatient Hospital Stay (HOSPITAL_COMMUNITY)
Admission: EM | Admit: 2012-12-17 | Discharge: 2012-12-19 | DRG: 300 | Disposition: A | Discharge status: Home / Self Care | Payer: Medicaid Other | Attending: Internal Medicine | Admitting: Internal Medicine

## 2012-12-17 ENCOUNTER — Encounter (HOSPITAL_COMMUNITY): Payer: Self-pay | Admitting: *Deleted

## 2012-12-17 DIAGNOSIS — E876 Hypokalemia: Secondary | ICD-10-CM

## 2012-12-17 DIAGNOSIS — Z91199 Patient's noncompliance with other medical treatment and regimen due to unspecified reason: Secondary | ICD-10-CM

## 2012-12-17 DIAGNOSIS — I868 Varicose veins of other specified sites: Secondary | ICD-10-CM | POA: Diagnosis present

## 2012-12-17 DIAGNOSIS — I82401 Acute embolism and thrombosis of unspecified deep veins of right lower extremity: Secondary | ICD-10-CM

## 2012-12-17 DIAGNOSIS — F10939 Alcohol use, unspecified with withdrawal, unspecified: Secondary | ICD-10-CM

## 2012-12-17 DIAGNOSIS — F10239 Alcohol dependence with withdrawal, unspecified: Secondary | ICD-10-CM

## 2012-12-17 DIAGNOSIS — R894 Abnormal immunological findings in specimens from other organs, systems and tissues: Secondary | ICD-10-CM | POA: Diagnosis present

## 2012-12-17 DIAGNOSIS — D6862 Lupus anticoagulant syndrome: Secondary | ICD-10-CM

## 2012-12-17 DIAGNOSIS — Z7901 Long term (current) use of anticoagulants: Secondary | ICD-10-CM

## 2012-12-17 DIAGNOSIS — R791 Abnormal coagulation profile: Secondary | ICD-10-CM

## 2012-12-17 DIAGNOSIS — I82402 Acute embolism and thrombosis of unspecified deep veins of left lower extremity: Secondary | ICD-10-CM

## 2012-12-17 DIAGNOSIS — I82403 Acute embolism and thrombosis of unspecified deep veins of lower extremity, bilateral: Secondary | ICD-10-CM

## 2012-12-17 DIAGNOSIS — I82890 Acute embolism and thrombosis of other specified veins: Secondary | ICD-10-CM | POA: Diagnosis present

## 2012-12-17 DIAGNOSIS — F1010 Alcohol abuse, uncomplicated: Secondary | ICD-10-CM

## 2012-12-17 DIAGNOSIS — F329 Major depressive disorder, single episode, unspecified: Secondary | ICD-10-CM | POA: Diagnosis present

## 2012-12-17 DIAGNOSIS — I809 Phlebitis and thrombophlebitis of unspecified site: Secondary | ICD-10-CM

## 2012-12-17 DIAGNOSIS — I2510 Atherosclerotic heart disease of native coronary artery without angina pectoris: Secondary | ICD-10-CM | POA: Diagnosis present

## 2012-12-17 DIAGNOSIS — R109 Unspecified abdominal pain: Secondary | ICD-10-CM

## 2012-12-17 DIAGNOSIS — F17200 Nicotine dependence, unspecified, uncomplicated: Secondary | ICD-10-CM | POA: Diagnosis present

## 2012-12-17 DIAGNOSIS — E871 Hypo-osmolality and hyponatremia: Secondary | ICD-10-CM

## 2012-12-17 DIAGNOSIS — M47812 Spondylosis without myelopathy or radiculopathy, cervical region: Secondary | ICD-10-CM | POA: Diagnosis present

## 2012-12-17 DIAGNOSIS — M1990 Unspecified osteoarthritis, unspecified site: Secondary | ICD-10-CM

## 2012-12-17 DIAGNOSIS — Z720 Tobacco use: Secondary | ICD-10-CM

## 2012-12-17 DIAGNOSIS — I824Y9 Acute embolism and thrombosis of unspecified deep veins of unspecified proximal lower extremity: Principal | ICD-10-CM | POA: Diagnosis present

## 2012-12-17 DIAGNOSIS — F332 Major depressive disorder, recurrent severe without psychotic features: Secondary | ICD-10-CM

## 2012-12-17 DIAGNOSIS — Z9119 Patient's noncompliance with other medical treatment and regimen: Secondary | ICD-10-CM

## 2012-12-17 DIAGNOSIS — F489 Nonpsychotic mental disorder, unspecified: Secondary | ICD-10-CM | POA: Diagnosis present

## 2012-12-17 DIAGNOSIS — D649 Anemia, unspecified: Secondary | ICD-10-CM

## 2012-12-17 DIAGNOSIS — B1910 Unspecified viral hepatitis B without hepatic coma: Secondary | ICD-10-CM | POA: Diagnosis present

## 2012-12-17 DIAGNOSIS — R319 Hematuria, unspecified: Secondary | ICD-10-CM

## 2012-12-17 DIAGNOSIS — F3289 Other specified depressive episodes: Secondary | ICD-10-CM | POA: Diagnosis present

## 2012-12-17 DIAGNOSIS — Z86718 Personal history of other venous thrombosis and embolism: Secondary | ICD-10-CM

## 2012-12-17 DIAGNOSIS — I82409 Acute embolism and thrombosis of unspecified deep veins of unspecified lower extremity: Secondary | ICD-10-CM

## 2012-12-17 LAB — CBC WITH DIFFERENTIAL/PLATELET
Basophils Absolute: 0 10*3/uL (ref 0.0–0.1)
Eosinophils Relative: 1 % (ref 0–5)
Lymphocytes Relative: 23 % (ref 12–46)
Lymphs Abs: 1.9 10*3/uL (ref 0.7–4.0)
MCV: 93.3 fL (ref 78.0–100.0)
Neutro Abs: 5.3 10*3/uL (ref 1.7–7.7)
Neutrophils Relative %: 64 % (ref 43–77)
Platelets: 107 10*3/uL — ABNORMAL LOW (ref 150–400)
RBC: 4.19 MIL/uL — ABNORMAL LOW (ref 4.22–5.81)
RDW: 16.8 % — ABNORMAL HIGH (ref 11.5–15.5)
WBC: 8.4 10*3/uL (ref 4.0–10.5)

## 2012-12-17 LAB — BASIC METABOLIC PANEL
CO2: 23 mEq/L (ref 19–32)
Calcium: 8.6 mg/dL (ref 8.4–10.5)
Chloride: 102 mEq/L (ref 96–112)
Glucose, Bld: 91 mg/dL (ref 70–99)
Potassium: 4 mEq/L (ref 3.5–5.1)
Sodium: 139 mEq/L (ref 135–145)

## 2012-12-17 LAB — PROTIME-INR
INR: 1.29 (ref 0.00–1.49)
Prothrombin Time: 15.8 seconds — ABNORMAL HIGH (ref 11.6–15.2)

## 2012-12-17 MED ORDER — OXYCODONE-ACETAMINOPHEN 5-325 MG PO TABS
2.0000 | ORAL_TABLET | Freq: Once | ORAL | Status: AC
  Administered 2012-12-17: 2 via ORAL
  Filled 2012-12-17: qty 2

## 2012-12-17 MED ORDER — IOHEXOL 300 MG/ML  SOLN
50.0000 mL | Freq: Once | INTRAMUSCULAR | Status: AC | PRN
  Administered 2012-12-17: 50 mL via ORAL

## 2012-12-17 NOTE — ED Notes (Addendum)
Pt's 02 dropped to 88% on RA, placed pt on 2L .  Pt is not SOB, no chest pain.  Will continue to monitor.

## 2012-12-17 NOTE — ED Notes (Signed)
Per EMS: Pt c/o left leg pain and swelling, was seen at [facility] on 4/5 and states he was dx with DVT and put on coumadin.

## 2012-12-17 NOTE — ED Notes (Signed)
Pt made aware that we need a urine sample.

## 2012-12-17 NOTE — ED Notes (Signed)
EMT went to move pt to treatment room, pt found on floor.  States he fell.  Placed on fall precautions-fall risk bracelet, EMT aware of fall precautions now.  Ivonne Andrew, PA made aware.

## 2012-12-17 NOTE — ED Provider Notes (Signed)
History     CSN: [id number]  Arrival date & time 12/17/12  2033   First MD Initiated Contact with Patient 12/17/12 2142      Chief Complaint  Patient presents with  . Leg Pain   HPI  History provided by the patient. Patient is a 47 year old male with previous diagnosis of lupus, CAD, DVT, PE, EtOH abuse and hepatitis B who presents with complaints of persistent pain and swelling to his left lower extremity. Patient states he has been diagnosed with a DVT to his lower leg which continues to be painful and swollen. Patient denies any significant changes and swelling or skin. He has been taking 15 mg Coumadin daily as prescribed. Patient also has secondary complaints of pain and nodular swelling with redness of the skin to his pubic area. Symptoms began yesterday afternoon and have been persistent. He denies ever having similar symptoms previously. Denies any past history of STD. Denies any penile discharge or pain. Denies any scrotal or testicular pain or swelling. No dysuria, hematuria urinary frequency. Patient also had a slight fall while in the emergency room triage area. He states he slipped getting from the wheelchair without calling for help. He states he had to use bathroom and did "piss myself". He denies any pain from fall. There is no head injury or LOC. Denies any other aggravating or alleviating factors. Denies any other associated symptoms. Denies chest pain, shortness of breath, hemoptysis or any symptoms similar to previous PE.    Past Medical History  Diagnosis Date  . Coronary artery disease   . Degenerative joint disease of spine   . Pulmonary embolism 06/2010  . Degenerative joint disease   . 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 ETOH abuse   . DVT (deep venous thrombosis)   . Hepatitis B   . Mental disorder   . Depression     Past Surgical History  Procedure Laterality Date  . Left elbow surgery    . Tonsillectomy      History reviewed. No pertinent family  history.  History  Substance Use Topics  . Smoking status: Current Every Day Smoker -- 3.00 packs/day for 30 years    Types: Cigarettes  . Smokeless tobacco: Never Used  . Alcohol Use: Yes     Comment: heavy drinker daily      Review of Systems  Constitutional: Negative for fever, chills and diaphoresis.  Respiratory: Negative for shortness of breath.   Cardiovascular: Negative for chest pain.  All other systems reviewed and are negative.    Allergies  Review of patient's allergies indicates no known allergies.  Home Medications   Current Outpatient Rx  Name  Route  Sig  Dispense  Refill  . Aspirin-Salicylamide-Caffeine (BC HEADACHE PO)   Oral   Take 1 Package by mouth 2 (two) times daily as needed (for pain).         Marland Kitchen diphenhydrAMINE (BENADRYL) 25 MG tablet   Oral   Take 50 mg by mouth daily as needed for sleep.         . mirtazapine (REMERON) 15 MG tablet   Oral   Take 1 tablet (15 mg total) by mouth at bedtime. For insomnia.   30 tablet   0   . traZODone (DESYREL) 50 MG tablet   Oral   Take 50 mg by mouth at bedtime and may repeat dose one time if needed. For insomnia.         Marland Kitchen warfarin (COUMADIN) 5 MG tablet  Oral   Take 20 mg by mouth daily. For anticoagulation.         Marland Kitchen warfarin (COUMADIN) 5 MG tablet   Oral   Take 3 tablets (15 mg total) by mouth daily.   42 tablet   0     BP 111/87  Pulse 102  Temp(Src) 98.3 F (36.8 C) (Oral)  Resp 18  SpO2 100%  Physical Exam  Nursing note and vitals reviewed. Constitutional: He is oriented to person, place, and time. He appears well-developed and well-nourished. No distress.  HENT:  Head: Normocephalic.  Neck: Normal range of motion. Neck supple.  No meningeal signs  Cardiovascular: Normal rate and regular rhythm.   Pulmonary/Chest: Effort normal and breath sounds normal. No respiratory distress. He has no wheezes. He has no rales.  Abdominal: Soft. There is no tenderness. There is no  rebound and no guarding.  Genitourinary: Penis normal.  Uncircumcised. No penile discharge. No erythema or swelling of the penis, scrotum or testicles. No tenderness.  Musculoskeletal:  Significant swelling of the left lower extremity with tenderness. Slight erythema and increased warmth to the skin. Normal distal pulses and sensations.  Neurological: He is alert and oriented to person, place, and time.  Skin: Skin is warm. No rash noted. There is erythema.  There is diffuse erythema of the skin with induration over the pubic area. Several tender nodular areas.  Psychiatric: He has a normal mood and affect.    ED Course  Procedures   Results for orders placed during the hospital encounter of 12/17/12  PROTIME-INR      Result Value Range   Prothrombin Time 15.8 (*) 11.6 - 15.2 seconds   INR 1.29  0.00 - 1.49  CBC WITH DIFFERENTIAL      Result Value Range   WBC 8.4  4.0 - 10.5 K/uL   RBC 4.19 (*) 4.22 - 5.81 MIL/uL   Hemoglobin 13.7  13.0 - 17.0 g/dL   HCT 16.1  09.6 - 04.5 %   MCV 93.3  78.0 - 100.0 fL   MCH 32.7  26.0 - 34.0 pg   MCHC 35.0  30.0 - 36.0 g/dL   RDW 40.9 (*) 81.1 - 91.4 %   Platelets 107 (*) 150 - 400 K/uL   Neutrophils Relative 64  43 - 77 %   Neutro Abs 5.3  1.7 - 7.7 K/uL   Lymphocytes Relative 23  12 - 46 %   Lymphs Abs 1.9  0.7 - 4.0 K/uL   Monocytes Relative 12  3 - 12 %   Monocytes Absolute 1.0  0.1 - 1.0 K/uL   Eosinophils Relative 1  0 - 5 %   Eosinophils Absolute 0.1  0.0 - 0.7 K/uL   Basophils Relative 0  0 - 1 %   Basophils Absolute 0.0  0.0 - 0.1 K/uL  BASIC METABOLIC PANEL      Result Value Range   Sodium 139  135 - 145 mEq/L   Potassium 4.0  3.5 - 5.1 mEq/L   Chloride 102  96 - 112 mEq/L   CO2 23  19 - 32 mEq/L   Glucose, Bld 91  70 - 99 mg/dL   BUN 7  6 - 23 mg/dL   Creatinine, Ser 7.82  0.50 - 1.35 mg/dL   Calcium 8.6  8.4 - 95.6 mg/dL   GFR calc non Af Amer >90  >90 mL/min   GFR calc Af Amer >90  >90 mL/min  URINALYSIS, ROUTINE W  REFLEX MICROSCOPIC      Result Value Range   Color, Urine AMBER (*) YELLOW   APPearance CLOUDY (*) CLEAR   Specific Gravity, Urine 1.027  1.005 - 1.030   pH 5.5  5.0 - 8.0   Glucose, UA NEGATIVE  NEGATIVE mg/dL   Hgb urine dipstick NEGATIVE  NEGATIVE   Bilirubin Urine SMALL (*) NEGATIVE   Ketones, ur 15 (*) NEGATIVE mg/dL   Protein, ur 30 (*) NEGATIVE mg/dL   Urobilinogen, UA 1.0  0.0 - 1.0 mg/dL   Nitrite NEGATIVE  NEGATIVE   Leukocytes, UA NEGATIVE  NEGATIVE  URINE MICROSCOPIC-ADD ON      Result Value Range   Squamous Epithelial / LPF RARE  RARE   WBC, UA 0-2  <3 WBC/hpf   Bacteria, UA FEW (*) RARE   Urine-Other MUCOUS PRESENT         Ct Abdomen Pelvis W Contrast  12/18/2012  *RADIOLOGY REPORT*  Clinical Data: Lower abdominal pain. On Coumadin for left leg DVT.  CT ABDOMEN AND PELVIS WITH CONTRAST  Technique:  Multidetector CT imaging of the abdomen and pelvis was performed following the standard protocol during bolus administration of intravenous contrast.  Contrast: OMNIPAQUE IOHEXOL 300 MG/ML  SOLN  Comparison: 05/20/2012  Findings: No evidence of retroperitoneal hemorrhage.  IVC filter is seen in appropriate position.  DVT is seen in the left external iliac and common femoral veins, with increased varices seen in the inguinal regions and suprapubic abdominal wall soft tissues.  Moderate to severe diffuse hepatic steatosis is again demonstrated with focal fatty infiltration adjacent falciform ligament.  No liver masses are identified.  Gallbladder is unremarkable.  The pancreas, spleen, adrenal glands, and kidneys are normal appearance.  No evidence of hydronephrosis.  No soft tissue masses or lymphadenopathy identified.  No evidence of acute inflammatory process or abnormal fluid collections.  No evidence of dilated bowel loops or hernia.  IMPRESSION:  1.  DVT in left external iliac and common femoral veins, with increased venous collaterals and varices in the inguinal and  suprapubic regions. 2.  IVC filter in appropriate position.  No evidence of retroperitoneal hemorrhage. 3.  Moderate to severe hepatic steatosis.   Original Report Authenticated By: Myles Rosenthal, M.D.      1. Thrombophlebitis   2. DVT (deep venous thrombosis), left       MDM  9:40PM patient seen and evaluated. Patient resting appears well in no acute distress.  Patient was also seen and evaluated with attending physician. Bedside ultrasound performed over the suprapubic area. Will plan to do CT scan for better evaluation.  Spoke with Dr. Lovell Sheehan with [facility] hospitalist.  She will see pt and admit to med surg obs bed under team 10.       Angus Seller, PA-C 12/18/12 [phone]

## 2012-12-18 ENCOUNTER — Encounter (HOSPITAL_COMMUNITY): Payer: Self-pay | Admitting: Radiology

## 2012-12-18 ENCOUNTER — Emergency Department (HOSPITAL_COMMUNITY): Payer: Medicaid Other

## 2012-12-18 DIAGNOSIS — F17200 Nicotine dependence, unspecified, uncomplicated: Secondary | ICD-10-CM

## 2012-12-18 DIAGNOSIS — I809 Phlebitis and thrombophlebitis of unspecified site: Secondary | ICD-10-CM

## 2012-12-18 DIAGNOSIS — I82409 Acute embolism and thrombosis of unspecified deep veins of unspecified lower extremity: Secondary | ICD-10-CM

## 2012-12-18 DIAGNOSIS — R791 Abnormal coagulation profile: Secondary | ICD-10-CM

## 2012-12-18 DIAGNOSIS — R109 Unspecified abdominal pain: Secondary | ICD-10-CM

## 2012-12-18 DIAGNOSIS — F1010 Alcohol abuse, uncomplicated: Secondary | ICD-10-CM

## 2012-12-18 LAB — URINALYSIS, ROUTINE W REFLEX MICROSCOPIC
Glucose, UA: NEGATIVE mg/dL
Hgb urine dipstick: NEGATIVE
Leukocytes, UA: NEGATIVE
Protein, ur: 30 mg/dL — AB
Specific Gravity, Urine: 1.027 (ref 1.005–1.030)
Urobilinogen, UA: 1 mg/dL (ref 0.0–1.0)

## 2012-12-18 LAB — BASIC METABOLIC PANEL
BUN: 7 mg/dL (ref 6–23)
Chloride: 101 mEq/L (ref 96–112)
Creatinine, Ser: 0.66 mg/dL (ref 0.50–1.35)
GFR calc non Af Amer: >90 mL/min (ref >90)
Glucose, Bld: 69 mg/dL — ABNORMAL LOW (ref 70–99)
Potassium: 4 mEq/L (ref 3.5–5.1)

## 2012-12-18 LAB — URINE MICROSCOPIC-ADD ON

## 2012-12-18 LAB — CBC
HCT: 37.2 % — ABNORMAL LOW (ref 39.0–52.0)
Hemoglobin: 12.8 g/dL — ABNORMAL LOW (ref 13.0–17.0)
MCH: 31.8 pg (ref 26.0–34.0)
MCHC: 34.4 g/dL (ref 30.0–36.0)
RDW: 16.9 % — ABNORMAL HIGH (ref 11.5–15.5)

## 2012-12-18 MED ORDER — SODIUM CHLORIDE 0.9 % IV SOLN
INTRAVENOUS | Status: DC
  Administered 2012-12-18 (×4): via INTRAVENOUS

## 2012-12-18 MED ORDER — KETOROLAC TROMETHAMINE 30 MG/ML IJ SOLN
30.0000 mg | Freq: Once | INTRAMUSCULAR | Status: AC
  Administered 2012-12-18: 30 mg via INTRAVENOUS
  Filled 2012-12-18: qty 1

## 2012-12-18 MED ORDER — ZOLPIDEM TARTRATE 5 MG PO TABS: 5.0000 mg | ORAL_TABLET | Freq: Every evening | ORAL | Status: DC | PRN

## 2012-12-18 MED ORDER — WARFARIN - PHARMACIST DOSING INPATIENT: Freq: Every day | Status: DC

## 2012-12-18 MED ORDER — ONDANSETRON HCL 4 MG PO TABS: 4.0000 mg | ORAL_TABLET | Freq: Four times a day (QID) | ORAL | Status: DC | PRN

## 2012-12-18 MED ORDER — ADULT MULTIVITAMIN W/MINERALS CH
1.0000 | ORAL_TABLET | Freq: Every day | ORAL | Status: DC
  Administered 2012-12-18 – 2012-12-19 (×2): 1 via ORAL
  Filled 2012-12-18 (×2): qty 1

## 2012-12-18 MED ORDER — NICOTINE 14 MG/24HR TD PT24
14.0000 mg | MEDICATED_PATCH | Freq: Every day | TRANSDERMAL | Status: DC
  Administered 2012-12-18 – 2012-12-19 (×2): 14 mg via TRANSDERMAL
  Filled 2012-12-18 (×3): qty 1

## 2012-12-18 MED ORDER — LORAZEPAM 2 MG/ML IJ SOLN: 1.0000 mg | Freq: Four times a day (QID) | INTRAMUSCULAR | Status: DC | PRN

## 2012-12-18 MED ORDER — LORAZEPAM 1 MG PO TABS
0.0000 mg | ORAL_TABLET | Freq: Four times a day (QID) | ORAL | Status: DC
  Administered 2012-12-18 (×2): 1 mg via ORAL
  Filled 2012-12-18 (×2): qty 1

## 2012-12-18 MED ORDER — IOHEXOL 300 MG/ML  SOLN
100.0000 mL | Freq: Once | INTRAMUSCULAR | Status: AC | PRN
  Administered 2012-12-18: 100 mL via INTRAVENOUS

## 2012-12-18 MED ORDER — LORAZEPAM 1 MG PO TABS: 0.0000 mg | ORAL_TABLET | Freq: Two times a day (BID) | ORAL | Status: DC

## 2012-12-18 MED ORDER — HYDROMORPHONE HCL PF 1 MG/ML IJ SOLN: 0.5000 mg | INTRAMUSCULAR | Status: DC | PRN

## 2012-12-18 MED ORDER — ENOXAPARIN SODIUM 100 MG/ML ~~LOC~~ SOLN
90.0000 mg | Freq: Once | SUBCUTANEOUS | Status: DC
  Filled 2012-12-18: qty 1

## 2012-12-18 MED ORDER — LORAZEPAM 1 MG PO TABS: 1.0000 mg | ORAL_TABLET | Freq: Four times a day (QID) | ORAL | Status: DC | PRN

## 2012-12-18 MED ORDER — WARFARIN SODIUM 10 MG PO TABS
20.0000 mg | ORAL_TABLET | Freq: Once | ORAL | Status: AC
  Administered 2012-12-18: 20 mg via ORAL
  Filled 2012-12-18: qty 2

## 2012-12-18 MED ORDER — ONDANSETRON HCL 4 MG/2ML IJ SOLN: 4.0000 mg | Freq: Four times a day (QID) | INTRAMUSCULAR | Status: DC | PRN

## 2012-12-18 MED ORDER — ENOXAPARIN SODIUM 100 MG/ML ~~LOC~~ SOLN
90.0000 mg | Freq: Two times a day (BID) | SUBCUTANEOUS | Status: DC
  Administered 2012-12-18 – 2012-12-19 (×2): 90 mg via SUBCUTANEOUS
  Filled 2012-12-18 (×5): qty 1

## 2012-12-18 MED ORDER — VITAMIN B-1 100 MG PO TABS
100.0000 mg | ORAL_TABLET | Freq: Every day | ORAL | Status: DC
  Administered 2012-12-18 – 2012-12-19 (×2): 100 mg via ORAL
  Filled 2012-12-18 (×2): qty 1

## 2012-12-18 MED ORDER — ENOXAPARIN SODIUM 100 MG/ML ~~LOC~~ SOLN
90.0000 mg | Freq: Once | SUBCUTANEOUS | Status: AC
  Administered 2012-12-18: 90 mg via SUBCUTANEOUS
  Filled 2012-12-18: qty 1

## 2012-12-18 MED ORDER — FOLIC ACID 1 MG PO TABS
1.0000 mg | ORAL_TABLET | Freq: Every day | ORAL | Status: DC
  Administered 2012-12-18 – 2012-12-19 (×2): 1 mg via ORAL
  Filled 2012-12-18 (×2): qty 1

## 2012-12-18 MED ORDER — ACETAMINOPHEN 325 MG PO TABS: 650.0000 mg | ORAL_TABLET | Freq: Four times a day (QID) | ORAL | Status: DC | PRN

## 2012-12-18 MED ORDER — OXYCODONE HCL 5 MG PO TABS
5.0000 mg | ORAL_TABLET | ORAL | Status: DC | PRN
  Administered 2012-12-18 – 2012-12-19 (×6): 5 mg via ORAL
  Filled 2012-12-18 (×6): qty 1

## 2012-12-18 MED ORDER — THIAMINE HCL 100 MG/ML IJ SOLN
100.0000 mg | Freq: Every day | INTRAMUSCULAR | Status: DC
  Filled 2012-12-18 (×2): qty 1

## 2012-12-18 MED ORDER — ACETAMINOPHEN 650 MG RE SUPP: 650.0000 mg | Freq: Four times a day (QID) | RECTAL | Status: DC | PRN

## 2012-12-18 MED ORDER — ALUM & MAG HYDROXIDE-SIMETH 200-200-20 MG/5ML PO SUSP: 30.0000 mL | Freq: Four times a day (QID) | ORAL | Status: DC | PRN

## 2012-12-18 NOTE — ED Notes (Signed)
Attempted to call report, nurse unavailable.  Left my number for them to call back.   

## 2012-12-18 NOTE — ED Notes (Signed)
Pt's 02 dropped to 89% on 2L [place], increased 02 to 3L.  Pt's 02 up to 94%.

## 2012-12-18 NOTE — ED Notes (Signed)
Pt finished with PO contrast- CT notified.

## 2012-12-18 NOTE — ED Provider Notes (Signed)
Shared service with midlevel provider. I have personally seen and examined the patient, providing direct face to face care, presenting with the chief complaint of abd pain. Physical exam findings include erythema and tenderness on the suprapubic region. Plan will be to get CT pelvis, ensure there is no underlying abscess. I have reviewed the nursing documentation on past medical history, family history, and social history.  Derwood Kaplan, MD 12/18/12 1510

## 2012-12-18 NOTE — Progress Notes (Signed)
ANTICOAGULATION CONSULT NOTE - Initial Consult  Pharmacy Consult for Coumadin Indication: h/o PE/DVT w/ lupus coag disorder  No Known Allergies  Patient Measurements: Weight: ~90kg  Vital Signs: Temp: 98.3 F (36.8 C) (04/12 2036) Temp src: Oral (04/12 2036) BP: 124/74 mmHg (04/13 0515) Pulse Rate: 74 (04/13 0515)  Labs:  Recent Labs  12/17/12 2239 12/18/12 0504  HGB 13.7 12.8*  HCT 39.1 37.2*  PLT 107* 101*  LABPROT 15.8*  --   INR 1.29  --   CREATININE 0.66  --     Medical History: Past Medical History  Diagnosis Date  . Coronary artery disease   . Degenerative joint disease of spine   . Pulmonary embolism 06/2010  . Degenerative joint disease   . 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 ETOH abuse   . DVT (deep venous thrombosis)   . Hepatitis B   . Mental disorder   . Depression      Assessment: 47yo male w/ h/o PE/DVT w/ lupus coag disorder and plan to be anticoagulated indefinitely, also has IVC filter, known to be medical noncompliant, known alcohol abuse with multiple admissions for same, multiple events of sub- and supratherapeutic INR, likely due to noncompliance and EtOH; now comes to ED c/o LLE pain/swelling as well as above pubis, CT shows thrombophlebitis of collaterals, to continue Coumadin with LMWH bridge given low INR.  Of note prior hem/onc notes say that pt was to have changed to Xarelto given compliance issues but pt never returned to check INR and transition.  Goal of Therapy:  INR 2-3   Plan:  Will give Coumadin 20mg  po x1 today and monitor INR for dose adjustments.  Vernard Gambles, PharmD, BCPS  12/18/2012,5:34 AM

## 2012-12-18 NOTE — Progress Notes (Signed)
PATIENT DETAILS Name: [name] Age: 47 y.o. Sex: male Date of Birth: 25-Sep-1965 Admit Date: 12/17/2012 Admitting Physician Ron Parker, MD ZOX:[id number], Provider, MD  Subjective: Admitted with left leg pain and swelling. Non compliant to coumadin-as "ran out". Homeless and ETOH user-last drink yesterday. 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 -non complaint to coumadin -counseled extensively -c/w overlapping coumadin and Lovenox, has IVC filter in place -ideally needs this life long-but unfortunately non compliant  Active Problems: DVT of Left ext and common femoral veins -causing the left lower ext swelling-seen on abd CT scan -lower abd pain-likely cause by increased venous collaterals -as noted above on Overlapping coumadin and Lovenox  ETOH abuse -minimally tremulous today -last drink-4/13-drinks a "6 pack" daily-occasional Vodka -on Ativan per CIWA -c/w Thiamine  Tobacco Abuse -c/w Transdermal Nicotine -counseled extensively  Disposition: Remain inpatient  DVT Prophylaxis: Not needed as on overlapping Lovenox and coumadin  Code Status: Full code  Procedures:  None  CONSULTS:  None  PHYSICAL EXAM: Vital signs in last 24 hours: Filed Vitals:   12/18/12 0556 12/18/12 0953 12/18/12 1135 12/18/12 1334  BP: 126/78 125/71 127/80 146/80  Pulse: 70 100 83 89  Temp: 98.2 F (36.8 C) 98.5 F (36.9 C)  98.3 F (36.8 C)  TempSrc: Oral Oral  Oral  Resp: 18 20  20   Height: 6\' 2"  (1.88 m)     Weight: 85.2 kg (187 lb 13.3 oz)     SpO2: 95% 93%  92%    Weight change:  Body mass index is 24.11 kg/(m^2).   Gen Exam: Awake and alert with clear speech.   Neck: Supple, No JVD.   Chest: B/L Clear.   CVS: S1 S2 Regular, no murmurs.  Abdomen: soft, BS +, non tender, non distended.  Extremities: Left thigh and left leg  swelling Neurologic: Non Focal.   Skin: No Rash.   Wounds: N/A.    Intake/Output from previous day:  Intake/Output Summary (Last 24 hours) at 12/18/12 1510 Last data filed at 12/18/12 1325  Gross per 24 hour  Intake    120 ml  Output      1 ml  Net    119 ml     LAB RESULTS: CBC  Recent Labs Lab 12/17/12 2239 12/18/12 0504  WBC 8.4 5.7  HGB 13.7 12.8*  HCT 39.1 37.2*  PLT 107* 101*  MCV 93.3 92.5  MCH 32.7 31.8  MCHC 35.0 34.4  RDW 16.8* 16.9*  LYMPHSABS 1.9  --   MONOABS 1.0  --   EOSABS 0.1  --   BASOSABS 0.0  --     Chemistries   Recent Labs Lab 12/17/12 2239 12/18/12 0504  NA 139 136  K 4.0 4.0  CL 102 101  CO2 23 23  GLUCOSE 91 69*  BUN 7 7  CREATININE 0.66 0.66  CALCIUM 8.6 8.3*    CBG: No results found for this basename: GLUCAP,  in the last 168 hours  GFR Estimated Creatinine Clearance: 132.7 ml/min (by C-G formula based on Cr of 0.66).  Coagulation profile  Recent Labs Lab 12/17/12 2239  INR 1.29    Cardiac Enzymes No results found for this basename: CK, CKMB, TROPONINI, MYOGLOBIN,  in the last 168 hours  No components found with this basename: POCBNP,  No results found for this basename: DDIMER,  in the last 72 hours No results found for this  basename: HGBA1C,  in the last 72 hours No results found for this basename: CHOL, HDL, LDLCALC, TRIG, CHOLHDL, LDLDIRECT,  in the last 72 hours No results found for this basename: TSH, T4TOTAL, FREET3, T3FREE, THYROIDAB,  in the last 72 hours No results found for this basename: VITAMINB12, FOLATE, FERRITIN, TIBC, IRON, RETICCTPCT,  in the last 72 hours No results found for this basename: LIPASE, AMYLASE,  in the last 72 hours  Urine Studies No results found for this basename: UACOL, UAPR, USPG, UPH, UTP, UGL, UKET, UBIL, UHGB, UNIT, UROB, ULEU, UEPI, UWBC, URBC, UBAC, CAST, CRYS, UCOM, BILUA,  in the last 72 hours  MICROBIOLOGY: No results found for this or any previous visit (from the  past 240 hour(s)).  RADIOLOGY STUDIES/RESULTS: Dg Chest 2 View  12/11/2012  *RADIOLOGY REPORT*  Clinical Data: Chest pain and coronary artery disease.  CHEST - 2 VIEW  Comparison: 10/17/2012  Findings: Emphysematous changes and scattered fibrosis in the lungs.  Peribronchial thickening consistent with chronic bronchitis.  No focal consolidation.  No blunting of costophrenic angles.  No pneumothorax.  Calcified granuloma in the left midlung. Normal heart size and pulmonary vascularity.  Stable appearance since previous study.  IMPRESSION: No evidence of active pulmonary disease.  Stable chronic changes since previous study.   Original Report Authenticated By: Burman Nieves, M.D.    Ct Angio Chest Pe W/cm &/or Wo Cm  12/11/2012  *RADIOLOGY REPORT*  Clinical Data: Hemoptysis.  Evaluate for pulmonary embolus.  CT ANGIOGRAPHY CHEST  Technique:  Multidetector CT imaging of the chest using the standard protocol during bolus administration of intravenous contrast. Multiplanar reconstructed images including MIPs were obtained and reviewed to evaluate the vascular anatomy.  Contrast: OMNIPAQUE IOHEXOL 350 MG/ML SOLN  Comparison: 03/28/2012  Findings:  Lungs/pleura: There is no pleural effusion identified.  There is a calcified granuloma in the superior segment of the left lower lobe. Mild architectural distortion within the medial right upper lobe. Scarring noted within the periphery of the right lower lobe.  There is no airspace consolidation.  No interstitial edema.  Mild changes of centrilobular emphysema.  Heart/Mediastinum: Normal heart size.  There is no pericardial effusion.  The main pulmonary artery is patent.  There are no abnormal filling defects noted within the lumbar or segmental pulmonary arteries to suggest a clinically significant pulmonary embolus.  No mediastinal or hilar adenopathy.  Upper abdomen: There is mild fatty infiltration of the liver.  The adrenal glands are both within normal  limits.  Bones/Musculoskeletal:  Review of the visualized osseous structures is significant for mild thoracic spondylosis.  IMPRESSION:  1.  No evidence for acute pulmonary embolus. 2.  Mild emphysema. 3.  Prior granulomatous disease   Original Report Authenticated By: Signa Kell, M.D.    Ct Abdomen Pelvis W Contrast  12/18/2012  *RADIOLOGY REPORT*  Clinical Data: Lower abdominal pain. On Coumadin for left leg DVT.  CT ABDOMEN AND PELVIS WITH CONTRAST  Technique:  Multidetector CT imaging of the abdomen and pelvis was performed following the standard protocol during bolus administration of intravenous contrast.  Contrast: OMNIPAQUE IOHEXOL 300 MG/ML  SOLN  Comparison: 05/20/2012  Findings: No evidence of retroperitoneal hemorrhage.  IVC filter is seen in appropriate position.  DVT is seen in the left external iliac and common femoral veins, with increased varices seen in the inguinal regions and suprapubic abdominal wall soft tissues.  Moderate to severe diffuse hepatic steatosis is again demonstrated with focal fatty infiltration adjacent falciform ligament.  No liver masses are identified.  Gallbladder is unremarkable.  The pancreas, spleen, adrenal glands, and kidneys are normal appearance.  No evidence of hydronephrosis.  No soft tissue masses or lymphadenopathy identified.  No evidence of acute inflammatory process or abnormal fluid collections.  No evidence of dilated bowel loops or hernia.  IMPRESSION:  1.  DVT in left external iliac and common femoral veins, with increased venous collaterals and varices in the inguinal and suprapubic regions. 2.  IVC filter in appropriate position.  No evidence of retroperitoneal hemorrhage. 3.  Moderate to severe hepatic steatosis.   Original Report Authenticated By: Myles Rosenthal, M.D.     MEDICATIONS: Scheduled Meds: . enoxaparin (LOVENOX) injection  90 mg Subcutaneous Q12H  . folic acid  1 mg Oral Daily  . LORazepam  0-4 mg Oral Q6H   Followed by  .  [START ON 12/20/2012] LORazepam  0-4 mg Oral Q12H  . multivitamin with minerals  1 tablet Oral Daily  . nicotine  14 mg Transdermal Daily  . thiamine  100 mg Oral Daily   Or  . thiamine  100 mg Intravenous Daily  . warfarin  20 mg Oral ONCE-1800  . Warfarin - Pharmacist Dosing Inpatient   Does not apply q1800   Continuous Infusions: . sodium chloride 75 mL/hr at 12/18/12 0558   PRN Meds:.acetaminophen, acetaminophen, alum & mag hydroxide-simeth, HYDROmorphone (DILAUDID) injection, LORazepam, LORazepam, ondansetron (ZOFRAN) IV, ondansetron, oxyCODONE, zolpidem  Antibiotics: Anti-infectives   None       Jeoffrey Massed, MD  [facility] Pager:[phone]  If 7PM-7AM, please contact night-coverage [email or web address] Password [email or web address] 12/18/2012, 3:10 PM   LOS: 1 day

## 2012-12-18 NOTE — ED Notes (Signed)
Patient transported to CT 

## 2012-12-18 NOTE — Progress Notes (Signed)
[name] [id number] Admitted to [id number]: 12/18/2012 6:25 AM Attending Provider: Ron Parker, MD    [name] is a 47 y.o. male patient admitted from ED awake, alert  & orientated  X 3,  Full Code, VSS - Blood pressure 126/78, pulse 70, temperature 98.2 F (36.8 C), temperature source Oral, resp. rate 18, height 6\' 2"  (1.88 m), weight 85.2 kg (187 lb 13.3 oz), SpO2 95.00%., O2    2 L nasal cannular, no c/o shortness of breath, no c/o chest pain, no distress noted.    IV site WDL:  with a transparent dsg that's clean dry and intact.  Allergies:  No Known Allergies   Past Medical History  Diagnosis Date  . Coronary artery disease   . Degenerative joint disease of spine   . Pulmonary embolism 06/2010  . Degenerative joint disease   . 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 ETOH abuse   . DVT (deep venous thrombosis)   . Hepatitis B   . Mental disorder   . Depression     History:  obtained from patient  Pt orientation to unit, room and routine. Information packet given to patient/family and safety video refused.  Admission INP armband ID verified with patient, and in place. SR up x 2, fall risk assessment complete with Patient verbalizing understanding of risks associated with falls. Pt verbalizes an understanding of how to use the call bell and to call for help before getting out of bed.  Skin, LLE red and warm to the touch; scattered scabs.    Will cont to monitor and assist as needed.  Elisha Ponder, RN 12/18/2012 6:25 AM

## 2012-12-18 NOTE — H&P (Addendum)
[facility] History and Physical  [name] WUJ:[id number] DOB: 1966/02/02 DOA: 12/17/2012  Referring physician:  PCP: Default, Provider, MD  Specialists:   Chief Complaint: Swollen Left leg and lower ABD  HPI: [name] is a 47 y.o. 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   He also has an IVC filter which he has had for 2 years.    He denies having any fevers or chills, or any trauma to his legs or lower ABD.   He was evaluated in the ED and found to have a subtherapeutic INR at 1.29.   A CT scan was performed in the ED and he was found to have thrombophlebitis of collaterals around the suprapubic area.      Review of Systems: The patient denies anorexia, fever, chills, weight loss, vision loss, decreased hearing, hoarseness, chest pain, syncope, dyspnea on exertion, peripheral edema, balance deficits, hemoptysis, abdominal pain, nausea, vomiting, diarrhea, constipation, melena, hematochezia, severe indigestion/heartburn, hematuria, dysuria, incontinence, muscle weakness, suspicious skin lesions, transient blindness, difficulty walking, depression, unusual weight change, abnormal bleeding, enlarged lymph nodes, angioedema, and breast masses.    Past Medical History  Diagnosis Date  . Coronary artery disease   . Degenerative joint disease of spine   . Pulmonary embolism 06/2010  . Degenerative joint disease   . 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 ETOH abuse   . DVT (deep venous thrombosis)   . Hepatitis B   . Mental disorder   . Depression    Past Surgical History  Procedure Laterality Date  . Left elbow surgery    . Tonsillectomy       Medications:  HOME MEDS: Prior to Admission medications   Medication Sig Start Date End Date Taking? Authorizing Provider   Aspirin-Salicylamide-Caffeine (BC HEADACHE PO) Take 1 Package by mouth 2 (two) times daily as needed (for pain).   Yes Historical Provider, MD  diphenhydrAMINE (BENADRYL) 25 MG tablet Take 50 mg by mouth daily as needed for sleep.   Yes Historical Provider, MD  mirtazapine (REMERON) 15 MG tablet Take 1 tablet (15 mg total) by mouth at bedtime. For insomnia. 11/08/12  Yes Verne Spurr, PA-C  traZODone (DESYREL) 50 MG tablet Take 50 mg by mouth at bedtime and may repeat dose one time if needed. For insomnia. 11/08/12  Yes Verne Spurr, PA-C  warfarin (COUMADIN) 5 MG tablet Take 3 tablets (15 mg total) by mouth daily. 12/11/12  Yes Doug Sou, MD    Allergies:  No Known Allergies    Social History:   reports that he has been smoking Cigarettes.  He has a 90 pack-year smoking history. He has never used smokeless tobacco. He reports that  drinks alcohol, a 6 pack daily. And , he does not use illicit drugs.    Family History:  Cerebral Aneurysm in Paternal GrandMother and Paternal Uncle  AAA in Father    Physical Exam:  GEN:  Pleasant 47 year old Obese Caucasian Male examined  and in no acute distress; cooperative with exam Filed Vitals:   12/18/12 0330 12/18/12 0345 12/18/12 0400 12/18/12 0415  BP: 119/74 117/67 108/68 112/76  Pulse: 64 65 75 72  Temp:      TempSrc:      Resp:      SpO2: 96%  96% 96% 94%   Blood pressure 112/76, pulse 72, temperature 98.3 F (36.8 C), temperature source Oral, resp. rate 18, SpO2 94.00%. PSYCH: SHe is alert and oriented x4; does not appear anxious does not appear depressed; affect is normal HEENT: Normocephalic and Atraumatic, Mucous membranes pink; PERRLA; EOM intact; Fundi:  Benign;  No scleral icterus, Nares: Patent, Oropharynx: Clear, Edentulous or Fair Dentition, Neck:  FROM, no cervical lymphadenopathy nor thyromegaly or carotid bruit; no JVD; Breasts:: Not examined CHEST WALL: No tenderness CHEST: Normal respiration, clear to auscultation  bilaterally HEART: Regular rate and rhythm; no murmurs rubs or gallops BACK: No kyphosis or scoliosis; no CVA tenderness ABDOMEN: Positive Bowel Sounds, Scaphoid, Obese, soft non-tender; no masses, no organomegaly, no pannus; no intertriginous candida. Rectal Exam: Not done EXTREMITIES: No bone or joint deformity; age-appropriate arthropathy of the hands and knees; no cyanosis, clubbing or edema; no ulcerations. Genitalia: not examined PULSES: 2+ and symmetric SKIN: Normal hydration no rash or ulceration CNS: Cranial nerves 2-12 grossly intact no focal neurologic deficit   Labs & Imaging Results for orders placed during the hospital encounter of 12/17/12 (from the past 48 hour(s))  PROTIME-INR     Status: Abnormal   Collection Time    12/17/12 10:39 PM      Result Value Range   Prothrombin Time 15.8 (*) 11.6 - 15.2 seconds   INR 1.29  0.00 - 1.49  CBC WITH DIFFERENTIAL     Status: Abnormal   Collection Time    12/17/12 10:39 PM      Result Value Range   WBC 8.4  4.0 - 10.5 K/uL   RBC 4.19 (*) 4.22 - 5.81 MIL/uL   Hemoglobin 13.7  13.0 - 17.0 g/dL   HCT 78.2  95.6 - 21.3 %   MCV 93.3  78.0 - 100.0 fL   MCH 32.7  26.0 - 34.0 pg   MCHC 35.0  30.0 - 36.0 g/dL   RDW 08.6 (*) 57.8 - 46.9 %   Platelets 107 (*) 150 - 400 K/uL   Comment: PLATELET COUNT CONFIRMED BY SMEAR   Neutrophils Relative 64  43 - 77 %   Neutro Abs 5.3  1.7 - 7.7 K/uL   Lymphocytes Relative 23  12 - 46 %   Lymphs Abs 1.9  0.7 - 4.0 K/uL   Monocytes Relative 12  3 - 12 %   Monocytes Absolute 1.0  0.1 - 1.0 K/uL   Eosinophils Relative 1  0 - 5 %   Eosinophils Absolute 0.1  0.0 - 0.7 K/uL   Basophils Relative 0  0 - 1 %   Basophils Absolute 0.0  0.0 - 0.1 K/uL  BASIC METABOLIC PANEL     Status: None   Collection Time    12/17/12 10:39 PM      Result Value Range   Sodium 139  135 - 145 mEq/L   Potassium 4.0  3.5 - 5.1 mEq/L   Chloride 102  96 - 112 mEq/L   CO2 23  19 - 32 mEq/L   Glucose, Bld 91  70 - 99  mg/dL   BUN 7  6 - 23 mg/dL   Creatinine, Ser 6.29  0.50 - 1.35 mg/dL   Calcium 8.6  8.4 - 52.8 mg/dL   GFR calc non Af Amer >90  >90 mL/min   GFR calc Af Amer >90  >90 mL/min   Comment:            The eGFR has been  calculated     using the CKD EPI equation.     This calculation has not been     validated in all clinical     situations.     eGFR's persistently     <90 mL/min signify     possible Chronic Kidney Disease.  URINALYSIS, ROUTINE W REFLEX MICROSCOPIC     Status: Abnormal   Collection Time    12/18/12 12:04 AM      Result Value Range   Color, Urine AMBER (*) YELLOW   Comment: BIOCHEMICALS MAY BE AFFECTED BY COLOR   APPearance CLOUDY (*) CLEAR   Specific Gravity, Urine 1.027  1.005 - 1.030   pH 5.5  5.0 - 8.0   Glucose, UA NEGATIVE  NEGATIVE mg/dL   Hgb urine dipstick NEGATIVE  NEGATIVE   Bilirubin Urine SMALL (*) NEGATIVE   Ketones, ur 15 (*) NEGATIVE mg/dL   Protein, ur 30 (*) NEGATIVE mg/dL   Urobilinogen, UA 1.0  0.0 - 1.0 mg/dL   Nitrite NEGATIVE  NEGATIVE   Leukocytes, UA NEGATIVE  NEGATIVE  URINE MICROSCOPIC-ADD ON     Status: Abnormal   Collection Time    12/18/12 12:04 AM      Result Value Range   Squamous Epithelial / LPF RARE  RARE   WBC, UA 0-2  <3 WBC/hpf   Bacteria, UA FEW (*) RARE   Urine-Other MUCOUS PRESENT        Radiological Exams on Admission: Ct Abdomen Pelvis W Contrast  12/18/2012  *RADIOLOGY REPORT*  Clinical Data: Lower abdominal pain. On Coumadin for left leg DVT.  CT ABDOMEN AND PELVIS WITH CONTRAST  Technique:  Multidetector CT imaging of the abdomen and pelvis was performed following the standard protocol during bolus administration of intravenous contrast.  Contrast: OMNIPAQUE IOHEXOL 300 MG/ML  SOLN  Comparison: 05/20/2012  Findings: No evidence of retroperitoneal hemorrhage.  IVC filter is seen in appropriate position.  DVT is seen in the left external iliac and common femoral veins, with increased varices seen in the  inguinal regions and suprapubic abdominal wall soft tissues.  Moderate to severe diffuse hepatic steatosis is again demonstrated with focal fatty infiltration adjacent falciform ligament.  No liver masses are identified.  Gallbladder is unremarkable.  The pancreas, spleen, adrenal glands, and kidneys are normal appearance.  No evidence of hydronephrosis.  No soft tissue masses or lymphadenopathy identified.  No evidence of acute inflammatory process or abnormal fluid collections.  No evidence of dilated bowel loops or hernia.  IMPRESSION:  1.  DVT in left external iliac and common femoral veins, with increased venous collaterals and varices in the inguinal and suprapubic regions. 2.  IVC filter in appropriate position.  No evidence of retroperitoneal hemorrhage. 3.  Moderate to severe hepatic steatosis.   Original Report Authenticated By: Myles Rosenthal, M.D.       Assessment/Plan Principal Problem:   Subtherapeutic international normalized ratio (INR) Active Problems:   Alcohol abuse   Abdominal pain   Tobacco abuse   DVT of lower extremity, bilateral   1.   Subtherapeutic INR-   Admit  And place on Lovenox full dose to bridge  While adjusting his Coumadin level.   He may need to discharge to home on lovenox to continue adjustment of his coumadin as an outpatient.     2.   ABD Pain-  Thrombophebitis of suprapubic area,  Same Rx as in #1.  Pain control PRN.     3.   ETOH  Abuse-  CIWA protocol ordered.     4.   Tobacco Abuse-   Counseled in regard to Cessation, Nicotine patch ordered.     5.   DVT of LLE- acute on chronic-  Rx with Full dose Lovenox until Coumadin adjusted.       Code Status:  FULL CODE Family Communication:    No Family Present Disposition Plan:    Return to Home on Discharge  Time spent: 66 Minutes  Ron Parker [facility] Pager [phone]  If 7PM-7AM, please contact night-coverage [email or web address] Password [facility] 12/18/2012, 5:05 AM

## 2012-12-19 ENCOUNTER — Other Ambulatory Visit: Payer: Self-pay | Admitting: Lab

## 2012-12-19 ENCOUNTER — Ambulatory Visit: Payer: Self-pay | Admitting: Oncology

## 2012-12-19 DIAGNOSIS — F10239 Alcohol dependence with withdrawal, unspecified: Secondary | ICD-10-CM

## 2012-12-19 LAB — CBC
HCT: 37.7 % — ABNORMAL LOW (ref 39.0–52.0)
RBC: 4.01 MIL/uL — ABNORMAL LOW (ref 4.22–5.81)
RDW: 16.6 % — ABNORMAL HIGH (ref 11.5–15.5)
WBC: 5.8 10*3/uL (ref 4.0–10.5)

## 2012-12-19 LAB — COMPREHENSIVE METABOLIC PANEL
ALT: 20 U/L (ref 0–53)
AST: 36 U/L (ref 0–37)
Alkaline Phosphatase: 76 U/L (ref 39–117)
CO2: 27 mEq/L (ref 19–32)
Calcium: 8.4 mg/dL (ref 8.4–10.5)
GFR calc Af Amer: >90 mL/min (ref >90)
GFR calc non Af Amer: >90 mL/min (ref >90)
Potassium: 3.9 mEq/L (ref 3.5–5.1)
Sodium: 135 mEq/L (ref 135–145)
Total Protein: 6.3 g/dL (ref 6.0–8.3)

## 2012-12-19 LAB — PROTIME-INR: INR: 1.79 — ABNORMAL HIGH (ref 0.00–1.49)

## 2012-12-19 LAB — PHOSPHORUS: Phosphorus: 3.3 mg/dL (ref 2.3–4.6)

## 2012-12-19 MED ORDER — RIVAROXABAN 15 MG PO TABS
15.0000 mg | ORAL_TABLET | Freq: Two times a day (BID) | ORAL | Status: DC
  Administered 2012-12-19: 15 mg via ORAL
  Filled 2012-12-19 (×2): qty 1

## 2012-12-19 MED ORDER — RIVAROXABAN 15 MG PO TABS
15.0000 mg | ORAL_TABLET | Freq: Two times a day (BID) | ORAL | Status: DC
  Filled 2012-12-19 (×2): qty 1

## 2012-12-19 MED ORDER — OXYCODONE HCL 5 MG PO TABS: 5.0000 mg | ORAL_TABLET | ORAL | Status: DC | PRN

## 2012-12-19 MED ORDER — WARFARIN SODIUM 7.5 MG PO TABS
17.5000 mg | ORAL_TABLET | Freq: Once | ORAL | Status: DC
  Filled 2012-12-19: qty 1

## 2012-12-19 MED ORDER — TRAZODONE HCL 50 MG PO TABS: 50.0000 mg | ORAL_TABLET | Freq: Every evening | ORAL | Status: DC | PRN

## 2012-12-19 MED ORDER — RIVAROXABAN 15 MG PO TABS: 15.0000 mg | ORAL_TABLET | Freq: Two times a day (BID) | ORAL | Status: DC

## 2012-12-19 NOTE — Progress Notes (Signed)
Nutrition Brief Note  Patient identified on the Malnutrition Screening Tool (MST) Report.  Patient denies any recent weight loss.  Reports a good appetite.  Body mass index is 24.11 kg/(m^2). Patient meets criteria for Normal based on current BMI.   Current diet order is Heart Healthy, patient is consuming approximately 95% of meals at this time. Labs and medications reviewed.   No nutrition interventions warranted at this time. If nutrition issues arise, please consult RD.   Maureen Chatters, RD, LDN Pager #: [phone] After-Hours Pager #: [phone]

## 2012-12-19 NOTE — Progress Notes (Addendum)
   CARE MANAGEMENT NOTE 12/19/2012  Patient:  [name],[name]   Account Number:  [id number]  Date Initiated:  12/19/2012  Documentation initiated by:  [facility]  Subjective/Objective Assessment:   DVT     Action/Plan:   homeless, pt participates with [facility]   Anticipated DC Date:  12/19/2012   Anticipated DC Plan:  HOME/SELF CARE  In-house referral  Clinical Social Worker      DC Associate Professor  CM consult  Medication Assistance  [facility] Program      Choice offered to / List presented to:             Status of service:  Completed, signed off Medicare Important Message given?   (If response is "NO", the following Medicare IM given date fields will be blank) Date Medicare IM given:   Date Additional Medicare IM given:    Discharge Disposition:  HOME/SELF CARE  Per UR Regulation:    If discussed at Long Length of Stay Meetings, dates discussed:    Comments:  12/19/2012 11:48 AM  NCM spoke to pt and states [facility] He has an upcoming appt and plans to follow up. Pt has missed several of his appts in the past. CSW will provide a few bus passes for pt to get to his appt. NCM scheduled appt with new Family Medicine at [facility]. Provided pt with brochure on what to bring to the appt on 4/24 at 11:45 am. Completed the 10 day trial with Xarelto. Will fill 11 days with M[facility]. Explained to pt that program can be used only once per year. States he cannot provide a letter from [facility] about shelter. States he has used all his days. Isidoro Donning RN CCM Case Mgmt phone [phone]

## 2012-12-19 NOTE — Clinical Social Work Note (Signed)
Patient ready for discharge today and RN case manager requested 3 bus tickets for patient to assist him in getting to medical appointments scheduled for him. Three bus tickets provided nurse to give to patient once he is ready to leave.   Genelle Bal, MSW, LCSW [phone]

## 2012-12-19 NOTE — Progress Notes (Signed)
ANTICOAGULATION CONSULT NOTE   Pharmacy Consult for Coumadin Indication: h/o PE/DVT w/ lupus coag disorder  No Known Allergies  Patient Measurements: Weight: ~90kg  Labs:  Recent Labs  12/17/12 2239 12/18/12 0504 12/19/12 0532  HGB 13.7 12.8* 13.0  HCT 39.1 37.2* 37.7*  PLT 107* 101* 103*  LABPROT 15.8*  --  20.2*  INR 1.29  --  1.79*  CREATININE 0.66 0.66 0.63    Assessment: 47yo male w/ h/o PE/DVT w/ lupus coag disorder and plan to be anticoagulated indefinitely, also has IVC filter, known to be medical noncompliant, known alcohol abuse with multiple admissions for same, multiple events of sub- and supratherapeutic INR, likely due to noncompliance and EtOH; now comes to ED c/o LLE pain/swelling as well as above pubis, CT shows thrombophlebitis of collaterals, to continue Coumadin with LMWH bridge given low INR.  Of note prior hem/onc notes say that pt was to have changed to Xarelto given compliance issues but pt never returned to check INR and transition.  INR= 1.79  Goal of Therapy:  INR 2-3   Plan:  1) Continue Lovenox 90 mg sq Q 12 hours 2) Coumadin 17.5 mg po x 1 today 3) Daily INR  Thank you. Okey Regal, PharmD [phone] 12/19/2012,8:50 AM

## 2012-12-19 NOTE — Discharge Summary (Signed)
Physician Discharge Summary  [name] ZOX:[id number] DOB: [birth year] DOA: 12/17/2012  PCP: Default, Provider, MD  Admit date: 12/17/2012 Discharge date: 12/19/2012  Time spent: 45 minutes  Recommendations for Outpatient Follow-up:  Patient will be discharged on Xeralto  He is scheduled to follow up with Dr. Gaylyn Rong of Hematology on 4/17 at 2:00 (Dr. Lodema Pilot office has a supply of Xeralto waiting on him)   Discharge Diagnoses:  Principal Problem:   Subtherapeutic international normalized ratio (INR) Active Problems:   Alcohol abuse   Tobacco abuse   DVT of lower extremity, bilateral   Abdominal pain   Discharge Condition: Stable,  Right leg still swollen.  Diet recommendation: low fat heart healthy  Filed Weights   12/18/12 0556  Weight: 85.2 kg (187 lb 13.3 oz)    History of present illness:  [name] is a 47 y.o. 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 He also has an IVC filter which he has had for 2 years. He denies having any fevers or chills, or any trauma to his legs or lower ABD. He was evaluated in the ED and found to have a subtherapeutic INR at 1.29. A CT scan was performed in the ED and he was found to have thrombophlebitis of collaterals around the suprapubic area.   Hospital Course:   History of DVT/PE Hyper coagulable state.  Subtherapeutic international normalized ratio (INR)   With new acute DVT of Left ext and common femoral veins  The patient had left lower extremity swelling along with pelvic swelling and abdominal pain.  CT Abdomen Pelvis showed DVT in left external iliac and common femoral veins, with increased venous collaterals and varices in the inguinal and suprapubic regions.  The pelvic pain is caused by the varices in the suprapubic regions.   While  inpatient [name] was continued on overlapping coumadin and Lovenox.  He has an IVC filter in place.    Unfortunately the patient was non complaint on coumadin he reported he did not have transportation to INR checks or Doctor's office visits. Dr. Lodema Pilot office was called.  Per his RN, Dr. Gaylyn Rong planned to start the patient on Xeralto, but the patient did not show up for his appointment.  The patient will be discharged on Xeralto today (15 mg bid for the first 21 days).    The case manager has faxed the necessary information to [facility] for him to receive on-going support from the pharmaceutical company.  Dr. Gaylyn Rong will care for the patient's on going Xeralto prescription.  The patient has an appointment to see Dr. Gaylyn Rong on 4/17 at 2:00 pm.   ETOH abuse  Last drink was 4/13 and he reports he drink a "6 pack" daily with occasional Vodka.  He shows no signs of withdraw.  During the hospital stay he was monitored on CIWA protocol and given thiamine and folic acid.  Tobacco Abuse  Given Transdermal Nicotine as an inpatient.  He was counseled extensively to quit smoking as it will contribute to blood clotting.  Procedures: Venous duplex:  The venous flow of the left common femoral is continuous compared to right common femoral, which is phasic. This could indicate an obstrucion more proximal, possibly in the left iliac vein.   Discharge Exam: Filed Vitals:   12/18/12  2300 12/19/12 0149 12/19/12 0532 12/19/12 1255  BP:  123/71 126/84 134/84  Pulse: 78 72 74 90  Temp:  98.3 F (36.8 C) 98.2 F (36.8 C) 99.5 F (37.5 C)  TempSrc:  Oral Oral Oral  Resp:  18 17 18   Height:      Weight:      SpO2:  96% 98% 98%    General: A&O, NAD, Sitting up in bed.  Poor hygiene Cardiovascular: rrr no m/r/g  Respiratory: CTA no w/c/r Abdomen:  Thin, soft, nt, nd, no masses Lower extremities:  Left leg swollen compared to right.  Minimally able to flex the foot without pain. GU:  Palpable tender lumps in pelvic area.   Varying sizes approximately - .5 cm - 2 cm  Discharge Instructions      Discharge Orders   Future Appointments Provider Department Dept Phone   12/22/2012 2:00 PM Sherrie Mustache [facility] [phone]   12/22/2012 2:30 PM Exie Parody, MD [facility] [phone]   Future Orders Complete By Expires     Diet - low sodium heart healthy  As directed     Increase activity slowly  As directed         Medication List    STOP taking these medications       warfarin 5 MG tablet  Commonly known as:  COUMADIN      TAKE these medications       BC HEADACHE PO  Take 1 Package by mouth 2 (two) times daily as needed (for pain).     diphenhydrAMINE 25 MG tablet  Commonly known as:  BENADRYL  Take 50 mg by mouth daily as needed for sleep.     mirtazapine 15 MG tablet  Commonly known as:  REMERON  Take 1 tablet (15 mg total) by mouth at bedtime. For insomnia.     oxyCODONE 5 MG immediate release tablet  Commonly known as:  Oxy IR/ROXICODONE  Take 1 tablet (5 mg total) by mouth every 4 (four) hours as needed.     Rivaroxaban 15 MG Tabs tablet  Commonly known as:  XARELTO  Take 1 tablet (15 mg total) by mouth 2 (two) times daily.     traZODone 50 MG tablet  Commonly known as:  DESYREL  Take 1 tablet (50 mg total) by mouth at bedtime and may repeat dose one time if needed. For insomnia.       Follow-up Information   Follow up with [facility] [place]. (appt December 29, 2012, at 11:45 am. please call if you are unable to make appt to reschedule. )    Contact information:   [phone]     [address] [place] [address]       Follow up with Jethro Bolus, MD On 12/22/2012. (2:00 pm)    Contact information:   [address] [place] [address] [phone]        The results of significant diagnostics from this hospitalization (including imaging, microbiology, ancillary and laboratory) are  listed below for reference.    Significant Diagnostic Studies: Dg Chest 2 View  12/11/2012  *RADIOLOGY REPORT*  Clinical Data: Chest pain and coronary artery disease.  CHEST - 2 VIEW  Comparison: 10/17/2012  Findings: Emphysematous changes and scattered fibrosis in the lungs.  Peribronchial thickening consistent with chronic bronchitis.  No focal consolidation.  No blunting of costophrenic angles.  No pneumothorax.  Calcified granuloma in  the left midlung. Normal heart size and pulmonary vascularity.  Stable appearance since previous study.  IMPRESSION: No evidence of active pulmonary disease.  Stable chronic changes since previous study.   Original Report Authenticated By: Burman Nieves, M.D.    Ct Angio Chest Pe W/cm &/or Wo Cm  12/11/2012  *RADIOLOGY REPORT*  Clinical Data: Hemoptysis.  Evaluate for pulmonary embolus.  CT ANGIOGRAPHY CHEST  Technique:  Multidetector CT imaging of the chest using the standard protocol during bolus administration of intravenous contrast. Multiplanar reconstructed images including MIPs were obtained and reviewed to evaluate the vascular anatomy.  Contrast: OMNIPAQUE IOHEXOL 350 MG/ML SOLN  Comparison: 03/28/2012  Findings:  Lungs/pleura: There is no pleural effusion identified.  There is a calcified granuloma in the superior segment of the left lower lobe. Mild architectural distortion within the medial right upper lobe. Scarring noted within the periphery of the right lower lobe.  There is no airspace consolidation.  No interstitial edema.  Mild changes of centrilobular emphysema.  Heart/Mediastinum: Normal heart size.  There is no pericardial effusion.  The main pulmonary artery is patent.  There are no abnormal filling defects noted within the lumbar or segmental pulmonary arteries to suggest a clinically significant pulmonary embolus.  No mediastinal or hilar adenopathy.  Upper abdomen: There is mild fatty infiltration of the liver.  The adrenal glands are both  within normal limits.  Bones/Musculoskeletal:  Review of the visualized osseous structures is significant for mild thoracic spondylosis.  IMPRESSION:  1.  No evidence for acute pulmonary embolus. 2.  Mild emphysema. 3.  Prior granulomatous disease   Original Report Authenticated By: Signa Kell, M.D.    Ct Abdomen Pelvis W Contrast  12/18/2012  *RADIOLOGY REPORT*  Clinical Data: Lower abdominal pain. On Coumadin for left leg DVT.  CT ABDOMEN AND PELVIS WITH CONTRAST  Technique:  Multidetector CT imaging of the abdomen and pelvis was performed following the standard protocol during bolus administration of intravenous contrast.  Contrast: OMNIPAQUE IOHEXOL 300 MG/ML  SOLN  Comparison: 05/20/2012  Findings: No evidence of retroperitoneal hemorrhage.  IVC filter is seen in appropriate position.  DVT is seen in the left external iliac and common femoral veins, with increased varices seen in the inguinal regions and suprapubic abdominal wall soft tissues.  Moderate to severe diffuse hepatic steatosis is again demonstrated with focal fatty infiltration adjacent falciform ligament.  No liver masses are identified.  Gallbladder is unremarkable.  The pancreas, spleen, adrenal glands, and kidneys are normal appearance.  No evidence of hydronephrosis.  No soft tissue masses or lymphadenopathy identified.  No evidence of acute inflammatory process or abnormal fluid collections.  No evidence of dilated bowel loops or hernia.  IMPRESSION:  1.  DVT in left external iliac and common femoral veins, with increased venous collaterals and varices in the inguinal and suprapubic regions. 2.  IVC filter in appropriate position.  No evidence of retroperitoneal hemorrhage. 3.  Moderate to severe hepatic steatosis.   Original Report Authenticated By: Myles Rosenthal, M.D.     Labs: Basic Metabolic Panel:  Recent Labs Lab 12/17/12 2239 12/18/12 0504 12/19/12 0532  NA 139 136 135  K 4.0 4.0 3.9  CL 102 101 102  CO2 23 23 27    GLUCOSE 91 69* 77  BUN 7 7 8   CREATININE 0.66 0.66 0.63  CALCIUM 8.6 8.3* 8.4  MG  --   --  1.8  PHOS  --   --  3.3   Liver Function Tests:  Recent Labs Lab 12/19/12 0532  AST 36  ALT 20  ALKPHOS 76  BILITOT 0.2*  PROT 6.3  ALBUMIN 2.7*   CBC:  Recent Labs Lab 12/17/12 2239 12/18/12 0504 12/19/12 0532  WBC 8.4 5.7 5.8  NEUTROABS 5.3  --   --   HGB 13.7 12.8* 13.0  HCT 39.1 37.2* 37.7*  MCV 93.3 92.5 94.0  PLT 107* 101* 103*   BNP: BNP (last 3 results)  Recent Labs  01/25/12 1316  PROBNP 75.9    Signed:  Evaristo Bury [phone]  [facility] 12/19/2012, 4:33 PM   Attending Patient seen and examined, agree with the assessment and plan. Dr Gaylyn Rong has recommended discharge on Xarelto, patient will be discharged to have a quick follow up at the cancer center. Long d/w patient at bedside-have re-emphasized need to be compliant with anticoagulation and to follow up. Risk of catastrophic fall with Alcohol explained in detail-he understands.   S Arieh Bogue

## 2012-12-19 NOTE — Progress Notes (Signed)
[name] discharged  per MD order.  Discharge instructions reviewed and discussed with the patient, all questions and concerns answered. Copy of instructions and scripts given to patient.  Pt. Given bus passes and Xarelto pills given to him by case management.    Medication List    STOP taking these medications       warfarin 5 MG tablet  Commonly known as:  COUMADIN      TAKE these medications       BC HEADACHE PO  Take 1 Package by mouth 2 (two) times daily as needed (for pain).     diphenhydrAMINE 25 MG tablet  Commonly known as:  BENADRYL  Take 50 mg by mouth daily as needed for sleep.     mirtazapine 15 MG tablet  Commonly known as:  REMERON  Take 1 tablet (15 mg total) by mouth at bedtime. For insomnia.     oxyCODONE 5 MG immediate release tablet  Commonly known as:  Oxy IR/ROXICODONE  Take 1 tablet (5 mg total) by mouth every 4 (four) hours as needed.     Rivaroxaban 15 MG Tabs tablet  Commonly known as:  XARELTO  Take 1 tablet (15 mg total) by mouth 2 (two) times daily.     traZODone 50 MG tablet  Commonly known as:  DESYREL  Take 1 tablet (50 mg total) by mouth at bedtime and may repeat dose one time if needed. For insomnia.        Patients skin is clean, dry and intact, no evidence of skin break down. IV site discontinued and catheter remains intact. Site without signs and symptoms of complications. Dressing and pressure applied.  Patient walked out by himself,  no distress noted upon discharge.  Laural Benes, Olamide Carattini C 12/19/2012 6:09 PM

## 2012-12-19 NOTE — Progress Notes (Signed)
   CARE MANAGEMENT NOTE 12/19/2012  Patient:  [name],[name]   Account Number:  [id number]  Date Initiated:  12/19/2012  Documentation initiated by:  [facility]  Subjective/Objective Assessment:   DVT     Action/Plan:   homeless, pt participates with [facility]   Anticipated DC Date:  12/19/2012   Anticipated DC Plan:  HOME/SELF CARE  In-house referral  Clinical Social Worker      DC Associate Professor  CM consult  Medication Assistance  MATCH Program      Choice offered to / List presented to:             Status of service:  Completed, signed off Medicare Important Message given?   (If response is "NO", the following Medicare IM given date fields will be blank) Date Medicare IM given:   Date Additional Medicare IM given:    Discharge Disposition:  HOME/SELF CARE  Per UR Regulation:    If discussed at Long Length of Stay Meetings, dates discussed:    Comments:  12/19/2012 1530 NCM picked up Xarelto from outpt pharmacy and explained to pt the process for continuing on medications. Explained he will have to follow up with Dr Gaylyn Rong on 4/17. Pt signed application for PAP for Xarelto and completed all portion except MD section. Application sent via interoffice mail to Dr Gaylyn Rong office. Isidoro Donning RN CCM Case Mgmt phone [phone]  12/19/2012 11:48 AM  NCM spoke to pt and states he see Dr. Gaylyn Rong at the [facility]. He has an upcoming appt and plans to follow up. Pt has missed several of his appts in the past. CSW will provide a few bus passes for pt to get to his appt. NCM scheduled appt with new [facility] at [facility]. Provided pt with brochure on what to bring to the appt on 4/24 at 11:45 am. Completed the 10 day trial with Xarelto. Will fill 11 days with MATCH. Explained to pt that program can be used only once per year. States he cannot provide a letter from [facility] about shelter. States he has used all his days. Isidoro Donning RN CCM Case Mgmt phone [phone]

## 2012-12-22 ENCOUNTER — Telehealth: Payer: Self-pay | Admitting: Oncology

## 2012-12-22 ENCOUNTER — Other Ambulatory Visit (HOSPITAL_BASED_OUTPATIENT_CLINIC_OR_DEPARTMENT_OTHER): Payer: Medicaid Other | Admitting: Lab

## 2012-12-22 ENCOUNTER — Ambulatory Visit (HOSPITAL_BASED_OUTPATIENT_CLINIC_OR_DEPARTMENT_OTHER): Payer: Medicaid Other | Admitting: Oncology

## 2012-12-22 VITALS — BP 125/73 | HR 98 | Temp 98.2°F | Resp 20 | Ht 74.0 in | Wt 200.3 lb

## 2012-12-22 DIAGNOSIS — Z86711 Personal history of pulmonary embolism: Secondary | ICD-10-CM

## 2012-12-22 DIAGNOSIS — F1010 Alcohol abuse, uncomplicated: Secondary | ICD-10-CM

## 2012-12-22 DIAGNOSIS — D6862 Lupus anticoagulant syndrome: Secondary | ICD-10-CM

## 2012-12-22 DIAGNOSIS — D6859 Other primary thrombophilia: Secondary | ICD-10-CM

## 2012-12-22 DIAGNOSIS — I82403 Acute embolism and thrombosis of unspecified deep veins of lower extremity, bilateral: Secondary | ICD-10-CM

## 2012-12-22 DIAGNOSIS — I82409 Acute embolism and thrombosis of unspecified deep veins of unspecified lower extremity: Secondary | ICD-10-CM

## 2012-12-22 DIAGNOSIS — I82729 Chronic embolism and thrombosis of deep veins of unspecified upper extremity: Secondary | ICD-10-CM

## 2012-12-22 LAB — CBC WITH DIFFERENTIAL/PLATELET
BASO%: 0.5 % (ref 0.0–2.0)
Eosinophils Absolute: 0.1 10*3/uL (ref 0.0–0.5)
LYMPH%: 19.4 % (ref 14.0–49.0)
MCHC: 33.1 g/dL (ref 32.0–36.0)
MONO#: 0.6 10*3/uL (ref 0.1–0.9)
NEUT#: 5.9 10*3/uL (ref 1.5–6.5)
Platelets: 213 10*3/uL (ref 140–400)
RBC: 4.07 10*6/uL — ABNORMAL LOW (ref 4.20–5.82)
RDW: 16.5 % — ABNORMAL HIGH (ref 11.0–14.6)
WBC: 8.3 10*3/uL (ref 4.0–10.3)
lymph#: 1.6 10*3/uL (ref 0.9–3.3)
nRBC: 0 % (ref 0–0)

## 2012-12-22 LAB — PROTIME-INR
INR: 1.3 — ABNORMAL LOW (ref 2.00–3.50)
Protime: 15.6 Seconds — ABNORMAL HIGH (ref 10.6–13.4)

## 2012-12-22 NOTE — Telephone Encounter (Signed)
gve the pt his may 2014 appt calendar °

## 2012-12-22 NOTE — Progress Notes (Signed)
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 Patient has a history of pulmonary embolism in October 2011.  PAST THERAPY: Patient was very noncompliant with Coumadin monitoring due to lack of transportation and being homeless. S/P IVC filter placement in October 2011.  CURRENT THERAPY: Xarelto.  INTERVAL HISTORY: [name] 47 y.o. male returns for routine follow-up by himself. He was recently diagnosed with DVT  in the left external iliac and common femoral veins on 12/18/2012. He was placed on Xarelto upon discharge given noncompliant Coumadin monitoring.  He reports that he still feels very full and distended in the pelvic area. He has left lower extremity edema and ulceration. He still drinks about 6 beers a day. He still homeless he [place]. He denies any headache, fever, shortness of breath, dyspnea on exertion, nausea vomiting, bleeding symptom. The rest of the 14 point review of system was negative.   Past Medical History  Diagnosis Date  . Coronary artery disease   . Degenerative joint disease of spine   . Pulmonary embolism 06/2010  . Degenerative joint disease   . 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 ETOH abuse   . DVT (deep venous thrombosis)   . Hepatitis B   . Mental disorder   . Depression     Past Surgical History  Procedure Laterality Date  . Left elbow surgery    . Tonsillectomy      Current Outpatient Prescriptions  Medication Sig Dispense Refill  . oxyCODONE (OXY IR/ROXICODONE) 5 MG immediate release tablet Take 1 tablet (5 mg total) by mouth every 4 (four) hours as needed.  30 tablet  0  . Rivaroxaban (XARELTO) 15 MG TABS tablet Take 1 tablet (15 mg total) by mouth 2 (two) times daily.  41 tablet  0  . traZODone (DESYREL) 50 MG tablet Take 1 tablet  (50 mg total) by mouth at bedtime and may repeat dose one time if needed. For insomnia.  30 tablet  0   No current facility-administered medications for this visit.    ALLERGIES:  has No Known Allergies.  REVIEW OF SYSTEMS:  The rest of the 14-point review of system was negative.   Filed Vitals:   12/22/12 1352  BP: 125/73  Pulse: 98  Temp: 98.2 F (36.8 C)  Resp: 20   Wt Readings from Last 3 Encounters:  12/22/12 200 lb 4.8 oz (90.855 kg)  12/18/12 187 lb 13.3 oz (85.2 kg)  10/14/12 200 lb (90.719 kg)   ECOG Performance status: 1  PHYSICAL EXAMINATION: General:  Disheveled male no acute distress.  Patient  still smells of alcohol. Eyes:  no scleral icterus.  ENT:  There were no oropharyngeal lesions.  Neck was without thyromegaly.  Lymphatics:  Negative cervical, supraclavicular or axillary adenopathy.  Respiratory: lungs were clear bilaterally without wheezing or crackles.  Cardiovascular:  Regular rate and rhythm, S1/S2, without murmur, rub or gallop.  There was no pedal edema to the right leg. Left lower extremity with 2+ edema  with venous ulceration but no active cellulitis, increased warmth, or purulent discharge.  GI:  abdomen was soft, flat, nontender, nondistended, without organomegaly.  Muscoloskeletal:  no spinal tenderness of palpation of vertebral spine.  Skin exam was without echymosis, petichae.  Neuro exam was nonfocal.  Patient was able to  get on and off exam table without assistance.  Gait was normal.  Patient was alert and oriented.  Attention was good.   Language was appropriate.  Mood was normal without depression.     LABORATORY/RADIOLOGY DATA:  Lab Results  Component Value Date   WBC 8.3 12/22/2012   HGB 13.2 12/22/2012   HCT 39.9 12/22/2012   PLT 213 12/22/2012   GLUCOSE 77 12/19/2012   CHOL 195 03/02/2011   TRIG 147 03/02/2011   HDL 96 03/02/2011   LDLCALC 70 03/02/2011   ALKPHOS 76 12/19/2012   ALT 20 12/19/2012   AST 36 12/19/2012   NA 135 12/19/2012   K 3.9  12/19/2012   CL 102 12/19/2012   CREATININE 0.63 12/19/2012   BUN 8 12/19/2012   CO2 27 12/19/2012   INR 1.30* 12/22/2012   HGBA1C 5.3 03/02/2011    ASSESSMENT AND PLAN:   1. Left lower extremity DVT.  - Again, I recommended Xarelto given the fact that he was very noncompliant with Coumadin and is monitoring. He is a successive recurrent multiple DVT and PE. I discussed with patient that Xarelto is just as effective as Coumadin in decreasing the risk of recurrent DVT and PE. However, Xarelto has shorter half-life than Coumadin. If he misses dose of Xarelto, he'll be at high risk of clotting again.  I referred him to pressure capsule to apply for this medication. Advised that Xarelto 50 mg by mouth twice a day for one week to finish the free samples that he had from the hospital. We only have free samples for Xarelto 20 mg daily. He expressed informed understanding and wished to continue with Xarelto. We will see him in 1 month to ensure that he is doing well on Xarelto  2. 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  3. Alcohol abuse. He is not interested in rehabilitation at this time or going to AA. He says that he is cutting down his beer intake on his own. 5. Social issues. He is applying for disability  6. Follow-up. In about 1 month.     The length of time of the face-to-face encounter was 15 minutes. More than 50% of time was spent counseling and coordination of care.

## 2012-12-26 ENCOUNTER — Encounter: Payer: Self-pay | Admitting: Oncology

## 2012-12-26 NOTE — Progress Notes (Signed)
Called DSS to see if app on file for the patient with case worker Erin Fulling. Ms Andrey Campanile said there is but he didn't complete his app. I called him and told to go there on tomorrow and complete it. He filled out EPP here in the office. Patient is homeless, no means of support- odds and ends around town. No one to help. He has applied for disability and still waiting to hear from there. He was suppose to go and check on his mail and he has not. He said will go by wed. I told him to go to DSS Tuesday for sure and complete his application. He said he would.

## 2012-12-31 ENCOUNTER — Emergency Department (HOSPITAL_COMMUNITY)
Admission: EM | Admit: 2012-12-31 | Discharge: 2013-01-01 | Disposition: A | Discharge status: Home / Self Care | Payer: Medicaid Other | Source: Home / Self Care | Attending: Emergency Medicine | Admitting: Emergency Medicine

## 2012-12-31 DIAGNOSIS — G8929 Other chronic pain: Secondary | ICD-10-CM | POA: Insufficient documentation

## 2012-12-31 DIAGNOSIS — R1909 Other intra-abdominal and pelvic swelling, mass and lump: Secondary | ICD-10-CM

## 2012-12-31 DIAGNOSIS — M79602 Pain in left arm: Secondary | ICD-10-CM

## 2012-12-31 DIAGNOSIS — IMO0002 Reserved for concepts with insufficient information to code with codable children: Secondary | ICD-10-CM | POA: Insufficient documentation

## 2012-12-31 DIAGNOSIS — F17200 Nicotine dependence, unspecified, uncomplicated: Secondary | ICD-10-CM | POA: Insufficient documentation

## 2012-12-31 DIAGNOSIS — M79609 Pain in unspecified limb: Secondary | ICD-10-CM | POA: Insufficient documentation

## 2012-12-31 DIAGNOSIS — I2510 Atherosclerotic heart disease of native coronary artery without angina pectoris: Secondary | ICD-10-CM | POA: Insufficient documentation

## 2012-12-31 DIAGNOSIS — Z86718 Personal history of other venous thrombosis and embolism: Secondary | ICD-10-CM | POA: Insufficient documentation

## 2012-12-31 DIAGNOSIS — R109 Unspecified abdominal pain: Secondary | ICD-10-CM | POA: Insufficient documentation

## 2012-12-31 DIAGNOSIS — F3289 Other specified depressive episodes: Secondary | ICD-10-CM | POA: Insufficient documentation

## 2012-12-31 DIAGNOSIS — Z8739 Personal history of other diseases of the musculoskeletal system and connective tissue: Secondary | ICD-10-CM | POA: Insufficient documentation

## 2012-12-31 DIAGNOSIS — Z86711 Personal history of pulmonary embolism: Secondary | ICD-10-CM | POA: Insufficient documentation

## 2012-12-31 DIAGNOSIS — Z8619 Personal history of other infectious and parasitic diseases: Secondary | ICD-10-CM | POA: Insufficient documentation

## 2012-12-31 DIAGNOSIS — Z79899 Other long term (current) drug therapy: Secondary | ICD-10-CM | POA: Insufficient documentation

## 2012-12-31 DIAGNOSIS — F1010 Alcohol abuse, uncomplicated: Secondary | ICD-10-CM | POA: Insufficient documentation

## 2012-12-31 DIAGNOSIS — F329 Major depressive disorder, single episode, unspecified: Secondary | ICD-10-CM | POA: Insufficient documentation

## 2012-12-31 NOTE — ED Notes (Signed)
Per EMS: Pt c/o of R leg pain. Hx of DVT. Pt recently taken off of Coumadin and replaced with another medication.  EtOH on board. EMS reports leg swollen in comparison to L leg.

## 2012-12-31 NOTE — ED Notes (Signed)
Pt brought in via EMS, [facility]  With c/o leg pain and pelvic pain. He has lt leg edema and redness. He states he has a DVT, h/o PE. His lt leg is larger than Rt leg. Taking Xaralto 15mg  bid x 2 weeks and has continous pain and swelling.  He has Xarelto 20mg  to take after taking the current dose.   He also has pain and redness above groin.  Noticed x 3 weeks.

## 2013-01-01 ENCOUNTER — Ambulatory Visit (HOSPITAL_COMMUNITY)
Admission: AD | Admit: 2013-01-01 | Discharge: 2013-01-01 | Disposition: A | Discharge status: Home / Self Care | Payer: Medicaid Other | Attending: Psychiatry | Admitting: Psychiatry

## 2013-01-01 ENCOUNTER — Inpatient Hospital Stay (HOSPITAL_COMMUNITY)
Admission: EM | Admit: 2013-01-01 | Discharge: 2013-01-04 | DRG: 300 | Disposition: A | Discharge status: Other Acute Inpatient Hospital | Payer: Medicaid Other | Attending: Internal Medicine | Admitting: Internal Medicine

## 2013-01-01 ENCOUNTER — Encounter (HOSPITAL_COMMUNITY): Payer: Self-pay | Admitting: *Deleted

## 2013-01-01 ENCOUNTER — Encounter (HOSPITAL_COMMUNITY): Payer: Self-pay

## 2013-01-01 DIAGNOSIS — R319 Hematuria, unspecified: Secondary | ICD-10-CM

## 2013-01-01 DIAGNOSIS — Z7901 Long term (current) use of anticoagulants: Secondary | ICD-10-CM

## 2013-01-01 DIAGNOSIS — F329 Major depressive disorder, single episode, unspecified: Secondary | ICD-10-CM | POA: Diagnosis present

## 2013-01-01 DIAGNOSIS — W19XXXA Unspecified fall, initial encounter: Secondary | ICD-10-CM

## 2013-01-01 DIAGNOSIS — Z720 Tobacco use: Secondary | ICD-10-CM | POA: Diagnosis present

## 2013-01-01 DIAGNOSIS — I82403 Acute embolism and thrombosis of unspecified deep veins of lower extremity, bilateral: Secondary | ICD-10-CM

## 2013-01-01 DIAGNOSIS — F332 Major depressive disorder, recurrent severe without psychotic features: Secondary | ICD-10-CM

## 2013-01-01 DIAGNOSIS — IMO0001 Reserved for inherently not codable concepts without codable children: Secondary | ICD-10-CM

## 2013-01-01 DIAGNOSIS — M79609 Pain in unspecified limb: Secondary | ICD-10-CM

## 2013-01-01 DIAGNOSIS — R45851 Suicidal ideations: Secondary | ICD-10-CM

## 2013-01-01 DIAGNOSIS — I2510 Atherosclerotic heart disease of native coronary artery without angina pectoris: Secondary | ICD-10-CM | POA: Diagnosis present

## 2013-01-01 DIAGNOSIS — E876 Hypokalemia: Secondary | ICD-10-CM

## 2013-01-01 DIAGNOSIS — F17200 Nicotine dependence, unspecified, uncomplicated: Secondary | ICD-10-CM

## 2013-01-01 DIAGNOSIS — M7989 Other specified soft tissue disorders: Secondary | ICD-10-CM

## 2013-01-01 DIAGNOSIS — L03116 Cellulitis of left lower limb: Secondary | ICD-10-CM | POA: Diagnosis present

## 2013-01-01 DIAGNOSIS — D6859 Other primary thrombophilia: Secondary | ICD-10-CM

## 2013-01-01 DIAGNOSIS — R791 Abnormal coagulation profile: Secondary | ICD-10-CM

## 2013-01-01 DIAGNOSIS — E871 Hypo-osmolality and hyponatremia: Secondary | ICD-10-CM

## 2013-01-01 DIAGNOSIS — F1020 Alcohol dependence, uncomplicated: Secondary | ICD-10-CM | POA: Diagnosis present

## 2013-01-01 DIAGNOSIS — F10939 Alcohol use, unspecified with withdrawal, unspecified: Secondary | ICD-10-CM

## 2013-01-01 DIAGNOSIS — F10239 Alcohol dependence with withdrawal, unspecified: Secondary | ICD-10-CM

## 2013-01-01 DIAGNOSIS — Z79899 Other long term (current) drug therapy: Secondary | ICD-10-CM

## 2013-01-01 DIAGNOSIS — D649 Anemia, unspecified: Secondary | ICD-10-CM

## 2013-01-01 DIAGNOSIS — D6862 Lupus anticoagulant syndrome: Secondary | ICD-10-CM | POA: Diagnosis present

## 2013-01-01 DIAGNOSIS — R109 Unspecified abdominal pain: Secondary | ICD-10-CM

## 2013-01-01 DIAGNOSIS — L02219 Cutaneous abscess of trunk, unspecified: Secondary | ICD-10-CM | POA: Diagnosis present

## 2013-01-01 DIAGNOSIS — Z5900 Homelessness unspecified: Secondary | ICD-10-CM

## 2013-01-01 DIAGNOSIS — B1910 Unspecified viral hepatitis B without hepatic coma: Secondary | ICD-10-CM | POA: Diagnosis present

## 2013-01-01 DIAGNOSIS — Z9119 Patient's noncompliance with other medical treatment and regimen: Secondary | ICD-10-CM

## 2013-01-01 DIAGNOSIS — M1990 Unspecified osteoarthritis, unspecified site: Secondary | ICD-10-CM

## 2013-01-01 DIAGNOSIS — I82409 Acute embolism and thrombosis of unspecified deep veins of unspecified lower extremity: Principal | ICD-10-CM | POA: Diagnosis present

## 2013-01-01 DIAGNOSIS — Z86711 Personal history of pulmonary embolism: Secondary | ICD-10-CM

## 2013-01-01 DIAGNOSIS — F3289 Other specified depressive episodes: Secondary | ICD-10-CM | POA: Diagnosis present

## 2013-01-01 DIAGNOSIS — L03319 Cellulitis of trunk, unspecified: Secondary | ICD-10-CM | POA: Diagnosis present

## 2013-01-01 DIAGNOSIS — F1010 Alcohol abuse, uncomplicated: Secondary | ICD-10-CM | POA: Diagnosis present

## 2013-01-01 DIAGNOSIS — I82402 Acute embolism and thrombosis of unspecified deep veins of left lower extremity: Secondary | ICD-10-CM

## 2013-01-01 DIAGNOSIS — Z91199 Patient's noncompliance with other medical treatment and regimen due to unspecified reason: Secondary | ICD-10-CM

## 2013-01-01 DIAGNOSIS — J438 Other emphysema: Secondary | ICD-10-CM | POA: Diagnosis present

## 2013-01-01 LAB — CBC WITH DIFFERENTIAL/PLATELET
Basophils Absolute: 0.1 10*3/uL (ref 0.0–0.1)
Basophils Absolute: 0 10*3/uL (ref 0.0–0.1)
Basophils Relative: 1 % (ref 0–1)
Basophils Relative: 0 % (ref 0–1)
Eosinophils Relative: 1 % (ref 0–5)
Lymphocytes Relative: 22 % (ref 12–46)
Lymphocytes Relative: 14 % (ref 12–46)
MCHC: 33.2 g/dL (ref 30.0–36.0)
MCHC: 33.6 g/dL (ref 30.0–36.0)
MCV: 96.4 fL (ref 78.0–100.0)
Monocytes Absolute: 0.6 10*3/uL (ref 0.1–1.0)
Neutro Abs: 6.7 10*3/uL (ref 1.7–7.7)
Neutrophils Relative %: 70 % (ref 43–77)
Platelets: 329 10*3/uL (ref 150–400)
Platelets: 311 10*3/uL (ref 150–400)
RDW: 16.4 % — ABNORMAL HIGH (ref 11.5–15.5)
RDW: 16.5 % — ABNORMAL HIGH (ref 11.5–15.5)
WBC: 9.7 10*3/uL (ref 4.0–10.5)
WBC: 7.2 10*3/uL (ref 4.0–10.5)

## 2013-01-01 LAB — PROTIME-INR
INR: 1.4 (ref 0.00–1.49)
Prothrombin Time: 16.8 seconds — ABNORMAL HIGH (ref 11.6–15.2)

## 2013-01-01 LAB — POCT I-STAT, CHEM 8
Calcium, Ion: 1.08 mmol/L — ABNORMAL LOW (ref 1.12–1.23)
Chloride: 109 mEq/L (ref 96–112)
Glucose, Bld: 97 mg/dL (ref 70–99)
HCT: 43 % (ref 39.0–52.0)
Hemoglobin: 14.6 g/dL (ref 13.0–17.0)
Potassium: 3.8 mEq/L (ref 3.5–5.1)

## 2013-01-01 LAB — COMPREHENSIVE METABOLIC PANEL
ALT: 19 U/L (ref 0–53)
AST: 28 U/L (ref 0–37)
Albumin: 3.2 g/dL — ABNORMAL LOW (ref 3.5–5.2)
CO2: 24 mEq/L (ref 19–32)
Calcium: 8.7 mg/dL (ref 8.4–10.5)
Creatinine, Ser: 0.75 mg/dL (ref 0.50–1.35)
GFR calc non Af Amer: >90 mL/min (ref >90)
Sodium: 140 mEq/L (ref 135–145)
Total Protein: 6.9 g/dL (ref 6.0–8.3)

## 2013-01-01 LAB — APTT: aPTT: 41 seconds — ABNORMAL HIGH (ref 24–37)

## 2013-01-01 LAB — ETHANOL: Alcohol, Ethyl (B): 350 mg/dL — ABNORMAL HIGH (ref 0–11)

## 2013-01-01 MED ORDER — ADULT MULTIVITAMIN W/MINERALS CH
1.0000 | ORAL_TABLET | Freq: Every day | ORAL | Status: DC
  Administered 2013-01-01 – 2013-01-04 (×4): 1 via ORAL
  Filled 2013-01-01 (×4): qty 1

## 2013-01-01 MED ORDER — LORAZEPAM 1 MG PO TABS
1.0000 mg | ORAL_TABLET | Freq: Four times a day (QID) | ORAL | Status: DC | PRN
  Administered 2013-01-01 – 2013-01-03 (×4): 1 mg via ORAL
  Filled 2013-01-01 (×5): qty 1

## 2013-01-01 MED ORDER — ENOXAPARIN SODIUM 100 MG/ML ~~LOC~~ SOLN
90.0000 mg | Freq: Two times a day (BID) | SUBCUTANEOUS | Status: DC
  Administered 2013-01-02: 90 mg via SUBCUTANEOUS
  Administered 2013-01-02: 05:00:00 via SUBCUTANEOUS
  Administered 2013-01-02 – 2013-01-04 (×4): 90 mg via SUBCUTANEOUS
  Filled 2013-01-01 (×7): qty 1

## 2013-01-01 MED ORDER — FOLIC ACID 1 MG PO TABS
1.0000 mg | ORAL_TABLET | Freq: Every day | ORAL | Status: DC
  Administered 2013-01-01: 1 mg via ORAL
  Filled 2013-01-01: qty 1

## 2013-01-01 MED ORDER — SODIUM CHLORIDE 0.9 % IV SOLN
INTRAVENOUS | Status: DC
  Administered 2013-01-01 – 2013-01-03 (×2): via INTRAVENOUS
  Administered 2013-01-04 (×2): 75 mL/h via INTRAVENOUS

## 2013-01-01 MED ORDER — ENOXAPARIN SODIUM 100 MG/ML ~~LOC~~ SOLN
90.0000 mg | Freq: Once | SUBCUTANEOUS | Status: AC
  Administered 2013-01-01: 90 mg via SUBCUTANEOUS
  Filled 2013-01-01: qty 1

## 2013-01-01 MED ORDER — ZIPRASIDONE MESYLATE 20 MG IM SOLR
20.0000 mg | Freq: Once | INTRAMUSCULAR | Status: DC
  Filled 2013-01-01: qty 20

## 2013-01-01 MED ORDER — THIAMINE HCL 100 MG/ML IJ SOLN
100.0000 mg | Freq: Every day | INTRAMUSCULAR | Status: DC
  Filled 2013-01-01 (×4): qty 1

## 2013-01-01 MED ORDER — THIAMINE HCL 100 MG/ML IJ SOLN
Freq: Once | INTRAVENOUS | Status: AC
  Administered 2013-01-01: 17:00:00 via INTRAVENOUS
  Filled 2013-01-01: qty 1000

## 2013-01-01 MED ORDER — OXYCODONE HCL 5 MG PO TABS
5.0000 mg | ORAL_TABLET | ORAL | Status: DC | PRN
  Administered 2013-01-01 – 2013-01-03 (×5): 5 mg via ORAL
  Filled 2013-01-01 (×5): qty 1

## 2013-01-01 MED ORDER — DOXYCYCLINE HYCLATE 100 MG PO TABS
100.0000 mg | ORAL_TABLET | Freq: Two times a day (BID) | ORAL | Status: DC
  Administered 2013-01-01 – 2013-01-04 (×6): 100 mg via ORAL
  Filled 2013-01-01 (×9): qty 1

## 2013-01-01 MED ORDER — VITAMIN B-1 100 MG PO TABS
100.0000 mg | ORAL_TABLET | Freq: Every day | ORAL | Status: DC
  Administered 2013-01-01 – 2013-01-04 (×4): 100 mg via ORAL
  Filled 2013-01-01 (×4): qty 1

## 2013-01-01 MED ORDER — LORAZEPAM 1 MG PO TABS
1.0000 mg | ORAL_TABLET | Freq: Four times a day (QID) | ORAL | Status: DC | PRN
  Administered 2013-01-01: 1 mg via ORAL
  Filled 2013-01-01: qty 1

## 2013-01-01 MED ORDER — LORAZEPAM 2 MG/ML IJ SOLN
1.0000 mg | Freq: Once | INTRAMUSCULAR | Status: AC
  Administered 2013-01-01: 1 mg via INTRAVENOUS

## 2013-01-01 MED ORDER — VITAMIN B-1 100 MG PO TABS
100.0000 mg | ORAL_TABLET | Freq: Every day | ORAL | Status: DC
  Administered 2013-01-01: 100 mg via ORAL
  Filled 2013-01-01: qty 1

## 2013-01-01 MED ORDER — FOLIC ACID 1 MG PO TABS
1.0000 mg | ORAL_TABLET | Freq: Every day | ORAL | Status: DC
  Administered 2013-01-01 – 2013-01-04 (×4): 1 mg via ORAL
  Filled 2013-01-01 (×4): qty 1

## 2013-01-01 MED ORDER — LORAZEPAM 2 MG/ML IJ SOLN
1.0000 mg | Freq: Four times a day (QID) | INTRAMUSCULAR | Status: DC | PRN
  Administered 2013-01-02 – 2013-01-03 (×3): 1 mg via INTRAVENOUS
  Filled 2013-01-01 (×4): qty 1

## 2013-01-01 MED ORDER — THIAMINE HCL 100 MG/ML IJ SOLN: 100.0000 mg | Freq: Every day | INTRAMUSCULAR | Status: DC

## 2013-01-01 MED ORDER — ADULT MULTIVITAMIN W/MINERALS CH
1.0000 | ORAL_TABLET | Freq: Every day | ORAL | Status: DC
  Administered 2013-01-01: 1 via ORAL
  Filled 2013-01-01: qty 1

## 2013-01-01 NOTE — ED Provider Notes (Signed)
History     CSN: [id number]  Arrival date & time 12/31/12  2312   First MD Initiated Contact with Patient 01/01/13 0005      Chief Complaint  Patient presents with  . 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  CAD, mental disorder, alcohol abuse presents tonight with continued pain in his left leg, radiating to his groin is also had 3-4 weeks of a mass in the suprapubic area.  He was evaluated on April 14 for the same CT scan did not reveal any pathology, treat he was recently transitioned from Coumadin to to Xarelto as an anticoagulant, which the patient states he is taking.  History agitated, demanding, aggressive, not allowing, you to speak between his commands for an answer, demanding to speak with the doctor in demanding a diagnosis  Patient is a 47 y.o. male presenting with leg pain. The history is provided by the patient.  Leg Pain Location:  Leg Time since incident: years. Leg location:  L leg Pain details:    Quality:  Aching   Radiates to:  Suprapubic region   Severity:  Moderate   Onset quality:  Unable to specify   Timing:  Constant   Progression:  Worsening Chronicity:  Chronic Dislocation: no   Relieved by:  Nothing Worsened by:  Activity Associated symptoms: no fever     Past Medical History  Diagnosis Date  . Coronary artery disease   . Degenerative joint disease of spine   . Pulmonary embolism 06/2010  . Degenerative joint disease   . 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 ETOH abuse   . DVT (deep venous thrombosis)   . Hepatitis B   . Mental disorder   . Depression     Past Surgical History  Procedure Laterality Date  . Left elbow surgery    . Tonsillectomy      No family history on file.  History  Substance Use Topics  . Smoking status: Current Every Day Smoker --  1.00 packs/day for 30 years    Types: Cigarettes  . Smokeless tobacco: Never Used  . Alcohol Use: Yes     Comment: heavy drinker daily.  six pack of beer daily      Review of Systems  Constitutional: Negative for fever and chills.  Cardiovascular: Positive for leg swelling.  Gastrointestinal: Positive for abdominal pain. Negative for nausea.  Genitourinary: Negative for dysuria, discharge, penile swelling, scrotal swelling, penile pain and testicular pain.  Musculoskeletal: Negative for joint swelling.  Skin: Positive for color change. Negative for wound.  Psychiatric/Behavioral: Positive for agitation.  All other systems reviewed and are negative.    Allergies  Review of patient's allergies indicates no known allergies.  Home Medications   Current Outpatient Rx  Name  Route  Sig  Dispense  Refill  . oxyCODONE (OXY IR/ROXICODONE) 5 MG immediate release tablet   Oral   Take 1 tablet (5 mg total) by mouth every 4 (four) hours as needed.   30 tablet   0   . Rivaroxaban (XARELTO) 15 MG TABS tablet   Oral   Take 1 tablet (15 mg total) by mouth 2 (two) times daily.   41 tablet   0   . traZODone (DESYREL) 50 MG tablet   Oral   Take 1 tablet (50 mg total) by mouth at bedtime and may repeat dose one  time if needed. For insomnia.   30 tablet   0     BP 102/66  Pulse 98  Temp(Src) 97.6 F (36.4 C) (Oral)  Resp 21  SpO2 95%  Physical Exam  Nursing note and vitals reviewed. Constitutional: He is oriented to person, place, and time. He appears well-developed and well-nourished.  Non-toxic appearance. No distress.  HENT:  Head: Normocephalic.  Eyes: Pupils are equal, round, and reactive to light.  Neck: Normal range of motion.  Cardiovascular: Normal rate.   Pulmonary/Chest: Effort normal and breath sounds normal.  Abdominal: Soft. Bowel sounds are normal. He exhibits no distension. There is tenderness. No hernia. Hernia confirmed negative in the ventral area.     Lumpy, red, minimally tender mass over the pubic mons  Musculoskeletal: He exhibits edema.       Left lower leg: He exhibits swelling. He exhibits no edema.       Legs: Lower leg red swollen upper leg pink warm to touch   Neurological: He is alert and oriented to person, place, and time.  Skin: There is erythema.  Psychiatric: His affect is angry and inappropriate. He is aggressive.    ED Course  Procedures (including critical care time)  Labs Reviewed  CBC WITH DIFFERENTIAL - Abnormal; Notable for the following:    RBC 4.15 (*)    RDW 16.4 (*)    All other components within normal limits  ETHANOL - Abnormal; Notable for the following:    Alcohol, Ethyl (B) 350 (*)    All other components within normal limits  POCT I-STAT, CHEM 8 - Abnormal; Notable for the following:    BUN <3 (*)    Calcium, Ion 1.08 (*)    All other components within normal limits   No results found.   1. Intoxication   2. Chronic leg pain, left   3. Abdominal wall mass of suprapubic region       MDM   Spoke with Radiologist re appropriate study of leg abd abdomen   CT Scan would not be useful  Most appropriate study will be ultrasound         Arman Filter, NP 01/01/13 [phone]

## 2013-01-01 NOTE — ED Provider Notes (Signed)
Medical screening examination/treatment/procedure(s) were performed by non-physician practitioner and as supervising physician I was immediately available for consultation/collaboration.  Sunnie Nielsen, MD 01/01/13 [phone]
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
## 2013-01-01 NOTE — ED Notes (Signed)
Pt will not cooperate for blood pressure reading.

## 2013-01-01 NOTE — ED Notes (Signed)
Report given to [facility] on receiving unit.
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
## 2013-01-01 NOTE — ED Notes (Signed)
Admitting MD at bedside to eval pt.

## 2013-01-01 NOTE — ED Provider Notes (Signed)
History     CSN: [id number]  Arrival date & time 01/01/13  4098   First MD Initiated Contact with Patient 01/01/13 1013      Chief Complaint  Patient presents with  . Agitation    (Consider location/radiation/quality/duration/timing/severity/associated sxs/prior treatment) HPI  Patient was in the ED last night and was discharged to get outpatient alcohol rehabilitation. He presented to  [facility] this morning and became combative. He was also making threats of hurting himself and per police he was making threats to the ED staff from last night. IVC papers were filled out and patient sent to the emergency department. 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 He believes his leg is getting worse however he cannot tell me if it has gotten worse over the last 3 weeks or just last couple days. He did initially tell me that it wasn't getting worse he just wasn't getting better. He states he has nausea and vomiting most mornings. Patient is a heavy drinker. He also complains of some swelling in his lower abdomen and indicates his groin area. He states he has a chronic cough because he smokes 2 packs a day. He denies any fever. He denies any diarrhea. He states is chronically short of breath.  This is his 11th ED visit in 6 months, most are for alcohol related issues.   Hematologist Dr. Gaylyn Rong  Past Medical History  Diagnosis Date  . Coronary artery disease   . Degenerative joint disease of spine   . Pulmonary embolism 06/2010  . Degenerative joint disease   . 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 ETOH abuse   . DVT (deep venous thrombosis)   . Hepatitis B   . Mental disorder   . Depression     Past Surgical History  Procedure Laterality Date  . Left elbow surgery    . Tonsillectomy      No family history on file.  History  Substance Use Topics  . Smoking status: Current Every Day Smoker  -- 2.00 packs/day for 30 years    Types: Cigarettes  . Smokeless tobacco: Never Used  . Alcohol Use: Yes     Comment: heavy drinker daily.  six pack of beer daily   patient states he drinks a case of beer plus a fifth of alcohol daily Homeless   Review of Systems  All other systems reviewed and are negative.    Allergies  Review of patient's allergies indicates no known allergies.  Home Medications   Current Outpatient Rx  Name  Route  Sig  Dispense  Refill  . oxyCODONE (OXY IR/ROXICODONE) 5 MG immediate release tablet   Oral   Take 5 mg by mouth every 4 (four) hours as needed for pain.         . Rivaroxaban (XARELTO) 15 MG TABS tablet   Oral   Take 1 tablet (15 mg total) by mouth 2 (two) times daily.   41 tablet   0   . traZODone (DESYREL) 50 MG tablet   Oral   Take 1 tablet (50 mg total) by mouth at bedtime and may repeat dose one time if needed. For insomnia.   30 tablet   0     BP 126/77  Pulse 98  Temp(Src) 98.2 F (36.8 C) (Oral)  Resp 18  Vital signs normal    Physical Exam  Nursing note  and vitals reviewed. Constitutional: He is oriented to person, place, and time. He appears well-developed and well-nourished.  Non-toxic appearance. He does not appear ill. No distress.  Ill kept male with many police and security officers in his room.  HENT:  Head: Normocephalic and atraumatic.  Right Ear: External ear normal.  Left Ear: External ear normal.  Nose: Nose normal. No mucosal edema or rhinorrhea.  Mouth/Throat: Oropharynx is clear and moist and mucous membranes are normal. No dental abscesses or edematous.  Poor dentition  Eyes: Conjunctivae and EOM are normal. Pupils are equal, round, and reactive to light.  Neck: Normal range of motion and full passive range of motion without pain. Neck supple.  Cardiovascular: Normal rate, regular rhythm and normal heart sounds.  Exam reveals no gallop and no friction rub.   No murmur heard. Pulmonary/Chest:  Effort normal and breath sounds normal. No respiratory distress. He has no wheezes. He has no rhonchi. He has no rales. He exhibits no tenderness and no crepitus.  Abdominal: Soft. Normal appearance and bowel sounds are normal. He exhibits no distension. There is no tenderness. There is no rebound and no guarding.  Genitourinary:  Patient's noted to have several grape-sized swollen areas in his mons pubis that feel like lymph nodes  Musculoskeletal: Normal range of motion. He exhibits edema and tenderness.  Moves all extremities well.  Patient's noted to have diffuse swelling of his whole left leg from the fall it to his toes. He has diffuse redness of the whole leg. He has some pain to palpation in his calf. There are no open wounds or draining from his leg.  Neurological: He is alert and oriented to person, place, and time. He has normal strength. No cranial nerve deficit.  Skin: Skin is warm, dry and intact. No rash noted. No erythema. No pallor.  Face is flushed  Psychiatric: He has a normal mood and affect. His speech is normal and behavior is normal. His mood appears not anxious.  Cooperative at this time although just prior to arrival he was documented to be extremely combative.    ED Course  Procedures (including critical care time)  Medications  LORazepam (ATIVAN) tablet 1 mg (1 mg Oral Given 01/01/13 1405)  thiamine (VITAMIN B-1) tablet 100 mg (100 mg Oral Given 01/01/13 1405)    Or  thiamine (B-1) injection 100 mg ( Intravenous See Alternative 01/01/13 1405)  folic acid (FOLVITE) tablet 1 mg (1 mg Oral Given 01/01/13 1405)  multivitamin with minerals tablet 1 tablet (1 tablet Oral Given 01/01/13 1405)  enoxaparin (LOVENOX) injection 90 mg (not administered)    Patient states he's been taking his Xarelto twice a day. He however despite eating on his anticoagulant has extension of his DVT from his pelvic veins now into his femoral veins. Patient also has significant alcoholism and he  was started on the alcohol withdrawal protocol. Patient will need medical admission to decide what to do for his DVT. He does however have a IVC filter in place. Patient also would need psychiatric evaluation, he did present to the ED with IVC papers and had stated he had suicidal and actually homicidal thoughts.  14:09 Dr Sharl Ma will admit and have Dr Gaylyn Rong see tomorrow.    VASCULAR LAB  PRELIMINARY PRELIMINARY PRELIMINARY PRELIMINARY  Left lower extremity venous Doppler completed.  Preliminary report: There is acute, occlusive DVT noted in the common femoral and femoral veins of the left lower extremity. This is a new finding since study done  12/11/12.  KANADY, CANDACE, RVT  01/01/2013, 12:21 PM   *PRELIMINARY RESULTS*  Vascular Ultrasound  Left lower extremity venous duplex has been completed. Preliminary findings: Left = no evidence of DVT. The venous flow of the left common femoral is continuous compared to the right common femoral, which is phasic. This could indicate an obstruction more proximal on the left side, possibly in the left iliac vein.  Farrel Demark, RDMS, RVT  12/11/2012, 8:36 AM  12/17/2012 CT ABDOMEN AND PELVIS WITH CONTRAST  IMPRESSION:  1. DVT in left external iliac and common femoral veins, with  increased venous collaterals and varices in the inguinal and  suprapubic regions.  2. IVC filter in appropriate position. No evidence of  retroperitoneal hemorrhage.  3. Moderate to severe hepatic steatosis.  Original Report Authenticated By: Myles Rosenthal, M.D.     Results for orders placed during the hospital encounter of 01/01/13  CBC WITH DIFFERENTIAL      Result Value Range   WBC 7.2  4.0 - 10.5 K/uL   RBC 4.11 (*) 4.22 - 5.81 MIL/uL   Hemoglobin 13.3  13.0 - 17.0 g/dL   HCT 11.9  14.7 - 82.9 %   MCV 96.4  78.0 - 100.0 fL   MCH 32.4  26.0 - 34.0 pg   MCHC 33.6  30.0 - 36.0 g/dL   RDW 56.2 (*) 13.0 - 86.5 %   Platelets 311  150 - 400 K/uL   Neutrophils Relative 80 (*)  43 - 77 %   Neutro Abs 5.8  1.7 - 7.7 K/uL   Lymphocytes Relative 14  12 - 46 %   Lymphs Abs 1.0  0.7 - 4.0 K/uL   Monocytes Relative 5  3 - 12 %   Monocytes Absolute 0.3  0.1 - 1.0 K/uL   Eosinophils Relative 1  0 - 5 %   Eosinophils Absolute 0.1  0.0 - 0.7 K/uL   Basophils Relative 0  0 - 1 %   Basophils Absolute 0.0  0.0 - 0.1 K/uL  COMPREHENSIVE METABOLIC PANEL      Result Value Range   Sodium 140  135 - 145 mEq/L   Potassium 3.9  3.5 - 5.1 mEq/L   Chloride 104  96 - 112 mEq/L   CO2 24  19 - 32 mEq/L   Glucose, Bld 118 (*) 70 - 99 mg/dL   BUN 6  6 - 23 mg/dL   Creatinine, Ser 7.84  0.50 - 1.35 mg/dL   Calcium 8.7  8.4 - 69.6 mg/dL   Total Protein 6.9  6.0 - 8.3 g/dL   Albumin 3.2 (*) 3.5 - 5.2 g/dL   AST 28  0 - 37 U/L   ALT 19  0 - 53 U/L   Alkaline Phosphatase 79  39 - 117 U/L   Total Bilirubin 0.1 (*) 0.3 - 1.2 mg/dL   GFR calc non Af Amer >90  >90 mL/min   GFR calc Af Amer >90  >90 mL/min  ETHANOL      Result Value Range   Alcohol, Ethyl (B) 99 (*) 0 - 11 mg/dL  APTT      Result Value Range   aPTT 41 (*) 24 - 37 seconds  PROTIME-INR      Result Value Range   Prothrombin Time 16.8 (*) 11.6 - 15.2 seconds   INR 1.40  0.00 - 1.49   Laboratory interpretation all normal except for mild alcohol intoxication     Dg Chest 2 View  12/11/2012 IMPRESSION: No evidence of active pulmonary disease.  Stable chronic changes since previous study.   Original Report Authenticated By: Burman Nieves, M.D.    Ct Angio Chest Pe W/cm &/or Wo Cm  12/11/2012   IMPRESSION:  1.  No evidence for acute pulmonary embolus. 2.  Mild emphysema. 3.  Prior granulomatous disease   Original Report Authenticated By: Signa Kell, M.D.    Ct Abdomen Pelvis W Contrast  12/18/2012    IMPRESSION:  1.  DVT in left external iliac and common femoral veins, with increased venous collaterals and varices in the inguinal and suprapubic regions. 2.  IVC filter in appropriate position.  No evidence of  retroperitoneal hemorrhage. 3.  Moderate to severe hepatic steatosis.   Original Report Authenticated By: Myles Rosenthal, M.D.       1. Deep vein thrombosis, left   2. Alcoholism /alcohol abuse   3. Suicidal ideation   4. DVT (deep venous thrombosis), left     Plan admission   Devoria Albe, MD, FACEP   MDM          Ward Givens, MD 01/01/13 [phone]

## 2013-01-01 NOTE — ED Notes (Signed)
ZOX:[id number]<AV> Expected date:<BR> Expected time:<BR> Means of arrival:<BR> Comments:<BR>

## 2013-01-01 NOTE — Progress Notes (Signed)
Pharmacy - Brief Note  Mr. Comb's Xarelto prescriptions were signed in to the [facility] pharmacy for storage.  It consists of Xarelto 15mg  x 13 tablets(of 20 prescribed) and Xarelto 20mg  x 30 tablets in a sample bottle.  Charolotte Eke, PharmD, pager [phone]. 01/01/2013,5:22 PM.

## 2013-01-01 NOTE — ED Notes (Signed)
Ultrasound Tech at bedside.

## 2013-01-01 NOTE — ED Notes (Signed)
Sitter at bedside.

## 2013-01-01 NOTE — Consult Note (Signed)
Reason for Consult: IVC, SI, HI? Referring Physician: unknown  [name] is an 47 y.o. male.  HPI:   47 y/o male with h/o positive lupus anticoagulant, pulmonary embolism, alcohol abuse, who was recently diagnosed with recurrent DVT. He was put on Xarelto and says he was taking it as prescribed. [place] and drinks alcohol daily 1 pack per since age 50. Last drink yesterday.    he came to the ED for worsening swelling of left leg, and doppler ultrasound showed acute occlusive DVT in the common femoral and femoral veins of the lower extremity, and it is a new finding.   Per pt he wants drug rehab now as he wants to quit drinking. Per pt he is not sure he was IVC. Denies any si, hi or avh now. Reports being depressed for many years not sure if etoh play role in it. denies other drugs. Was angry today and not interested to talk much. Per pt he refused admission here recently because he was here and police brought to ED this time. When asked why he could not answer.   Past Medical History  Diagnosis Date  . Coronary artery disease   . Degenerative joint disease of spine   . Pulmonary embolism 06/2010  . Degenerative joint disease   . 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 ETOH abuse   . DVT (deep venous thrombosis)   . Hepatitis B   . Mental disorder   . Depression     Past Surgical History  Procedure Laterality Date  . Left elbow surgery    . Tonsillectomy      History reviewed. No pertinent family history.  Social History:  reports that he has been smoking Cigarettes.  He has a 60 pack-year smoking history. He has never used smokeless tobacco. He reports that  drinks alcohol. He reports that he does not use illicit drugs.  Allergies: No Known Allergies  Medications: I have reviewed the patient's current medications.  Results for orders placed during the hospital encounter of 01/01/13 (from the past 48 hour(s))  CBC WITH DIFFERENTIAL     Status:  Abnormal   Collection Time    01/01/13 11:29 AM      Result Value Range   WBC 7.2  4.0 - 10.5 K/uL   RBC 4.11 (*) 4.22 - 5.81 MIL/uL   Hemoglobin 13.3  13.0 - 17.0 g/dL   HCT 16.1  09.6 - 04.5 %   MCV 96.4  78.0 - 100.0 fL   MCH 32.4  26.0 - 34.0 pg   MCHC 33.6  30.0 - 36.0 g/dL   RDW 40.9 (*) 81.1 - 91.4 %   Platelets 311  150 - 400 K/uL   Neutrophils Relative 80 (*) 43 - 77 %   Neutro Abs 5.8  1.7 - 7.7 K/uL   Lymphocytes Relative 14  12 - 46 %   Lymphs Abs 1.0  0.7 - 4.0 K/uL   Monocytes Relative 5  3 - 12 %   Monocytes Absolute 0.3  0.1 - 1.0 K/uL   Eosinophils Relative 1  0 - 5 %   Eosinophils Absolute 0.1  0.0 - 0.7 K/uL   Basophils Relative 0  0 - 1 %   Basophils Absolute 0.0  0.0 - 0.1 K/uL  COMPREHENSIVE METABOLIC PANEL     Status: Abnormal   Collection Time    01/01/13 11:29 AM      Result Value Range   Sodium 140  135 - 145 mEq/L   Potassium 3.9  3.5 - 5.1 mEq/L   Chloride 104  96 - 112 mEq/L   CO2 24  19 - 32 mEq/L   Glucose, Bld 118 (*) 70 - 99 mg/dL   BUN 6  6 - 23 mg/dL   Creatinine, Ser 1.61  0.50 - 1.35 mg/dL   Comment: DELTA CHECK NOTED   Calcium 8.7  8.4 - 10.5 mg/dL   Total Protein 6.9  6.0 - 8.3 g/dL   Albumin 3.2 (*) 3.5 - 5.2 g/dL   AST 28  0 - 37 U/L   ALT 19  0 - 53 U/L   Alkaline Phosphatase 79  39 - 117 U/L   Total Bilirubin 0.1 (*) 0.3 - 1.2 mg/dL   GFR calc non Af Amer >90  >90 mL/min   GFR calc Af Amer >90  >90 mL/min   Comment:            The eGFR has been calculated     using the CKD EPI equation.     This calculation has not been     validated in all clinical     situations.     eGFR's persistently     <90 mL/min signify     possible Chronic Kidney Disease.  ETHANOL     Status: Abnormal   Collection Time    01/01/13 11:29 AM      Result Value Range   Alcohol, Ethyl (B) 99 (*) 0 - 11 mg/dL   Comment:            LOWEST DETECTABLE LIMIT FOR     SERUM ALCOHOL IS 11 mg/dL     FOR MEDICAL PURPOSES ONLY  APTT     Status: Abnormal    Collection Time    01/01/13 11:29 AM      Result Value Range   aPTT 41 (*) 24 - 37 seconds   Comment:            IF BASELINE aPTT IS ELEVATED,     SUGGEST PATIENT RISK ASSESSMENT     BE USED TO DETERMINE APPROPRIATE     ANTICOAGULANT THERAPY.  PROTIME-INR     Status: Abnormal   Collection Time    01/01/13 11:29 AM      Result Value Range   Prothrombin Time 16.8 (*) 11.6 - 15.2 seconds   INR 1.40  0.00 - 1.49    No results found.  ROS Blood pressure 128/80, pulse 84, temperature 98.1 F (36.7 C), temperature source Oral, resp. rate 18, height 6' 2.02" (1.88 m), weight 86.818 kg (191 lb 6.4 oz), SpO2 100.00%. Physical Exam  Mental Status Examination/Evaluation:   Appearance: on bed confused   Eye Contact::  poor   Speech: limited   Volume: low   Mood: sad   Affect: ristricted   Thought Process: organized   Orientation: 3/4 not times   Thought Content: denies AVH   Suicidal Thoughts: No   Homicidal Thoughts: no   Memory: Recent; Poor   Judgement: Impaired   Insight: Lacking   Psychomotor Activity: slow   Concentration: poor   Recall: poor   Akathisia: No   Assessment:   AXIS I: r/o Delirium  NOS, Alcohol Dep, AXIS II: Deferred   AXIS III: see emdical hx ?     AXIS IV: problems related to social environment, problems with access to health care services and problems with primary support group   AXIS V: 35   ?  Treatment Plan/Recommendations:   - will benefit from drug rehab after detox.   - will continue to follow. Will consider [facility] if needed for safety after medical clearance. Currently he dies any si, hi or avh  - will recommend UDS and TSH if not done before.   - will follow as needed  Wonda Cerise 01/01/2013, 10:47 PM

## 2013-01-01 NOTE — ED Notes (Signed)
Pt coming from [facility] with IVC papers, pt has H/I toward EDP "want to be the shit out of him" pt also stating he has S/I. Pt to be transported with [employer], has been hostile trying to destroy areas of [facility].

## 2013-01-01 NOTE — Progress Notes (Signed)
VASCULAR LAB PRELIMINARY  PRELIMINARY  PRELIMINARY  PRELIMINARY  Left lower extremity venous Doppler completed.    Preliminary report:  There is acute, occlusive DVT noted in the common femoral and femoral veins of the left lower extremity.  This is a new finding since study done 12/11/12.   Keili Hasten, RVT 01/01/2013, 12:21 PM

## 2013-01-01 NOTE — H&P (Signed)
PCP:   Default, Provider, MD   Chief Complaint:  Left leg swelling  HPI: 47 y/o male with h/o positive lupus anticoagulant, pulmonary embolism, alcohol abuse, who was recently diagnosed with recurrent DVT and was seen by Dr Gaylyn Rong on 12/22/12. He was put on Xarelto and says he was taking it as prescribed. [place], and drinks alcohol daily. Today he came to the ED for worsening swelling of left leg, and doppler ultrasound showed acute occlusive DVT in the common femoral and femoral veins of the lower extremity, and it is a new finding.Patient also has swelling of the skin in the pubic area, which he says developed recently. No injury as per patient, he is not sexually active.   Allergies:  No Known Allergies    Past Medical History  Diagnosis Date  . Coronary artery disease   . Degenerative joint disease of spine   . Pulmonary embolism 06/2010  . Degenerative joint disease   . 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 ETOH abuse   . DVT (deep venous thrombosis)   . Hepatitis B   . Mental disorder   . Depression     Past Surgical History  Procedure Laterality Date  . Left elbow surgery    . Tonsillectomy      Prior to Admission medications   Medication Sig Start Date End Date Taking? Authorizing Provider  oxyCODONE (OXY IR/ROXICODONE) 5 MG immediate release tablet Take 5 mg by mouth every 4 (four) hours as needed for pain.   Yes Historical Provider, MD  Rivaroxaban (XARELTO) 15 MG TABS tablet Take 1 tablet (15 mg total) by mouth 2 (two) times daily. 12/19/12  Yes Tora Kindred [place], PA-C  traZODone (DESYREL) 50 MG tablet Take 1 tablet (50 mg total) by mouth at bedtime and may repeat dose one time if needed. For insomnia. 12/19/12  Yes Stephani Police, PA-C    Social History:  reports that he has been smoking Cigarettes.  He has a 60 pack-year smoking history. He has never used smokeless tobacco. He reports that  drinks alcohol. He reports that he does not use illicit  drugs.  No family history on file.  Review of Systems:  HEENT: Denies headache, blurred vision, runny nose, sore throat,  Neck: Denies thyroid problems,lymphadenopathy Chest : Denies shortness of breath, no history of COPD Heart : Denies Chest pain,  coronary arterey disease GI: Denies  nausea, vomiting, diarrhea, constipation GU: Denies dysuria, urgency, frequency of urination, hematuria Neuro: Denies stroke, seizures, syncope Psych: Positive h/o depression  Physical Exam: Blood pressure 143/83, pulse 108, temperature 98.2 F (36.8 C), temperature source Oral, resp. rate 18, SpO2 98.00%. Constitutional:   Patient is a well-developed and well-nourished  Male in no  distress and cooperative with exam. Head: Normocephalic and atraumatic Mouth: Mucus membranes moist Eyes: PERRL, EOMI, conjunctivae normal Neck: Supple, No Thyromegaly Cardiovascular: RRR, S1 normal, S2 normal Pulmonary/Chest: CTAB, no wheezes, rales, or rhonchi Abdominal: Soft. Non-tender, non-distended, bowel sounds are normal, no masses, organomegaly, or guarding present.  Neurological: A&O x3, Strenght is normal and symmetric bilaterally, cranial nerve II-XII are grossly intact, no focal motor deficit, sensory intact to light touch bilaterally.  Extremities : Positive erythema of the left lower extremity,pedal pulses are 2+ Skin:there is  Mild swelling of the pubic area, with mild erythema, and positive induration, non tender, no fluctuation  Labs on Admission:  Results for orders placed during the hospital encounter of 01/01/13 (from the past 48 hour(s))  CBC WITH DIFFERENTIAL     Status: Abnormal   Collection Time    01/01/13 11:29 AM      Result Value Range   WBC 7.2  4.0 - 10.5 K/uL   RBC 4.11 (*) 4.22 - 5.81 MIL/uL   Hemoglobin 13.3  13.0 - 17.0 g/dL   HCT 82.9  56.2 - 13.0 %   MCV 96.4  78.0 - 100.0 fL   MCH 32.4  26.0 - 34.0 pg   MCHC 33.6  30.0 - 36.0 g/dL   RDW 86.5 (*) 78.4 - 69.6 %   Platelets 311   150 - 400 K/uL   Neutrophils Relative 80 (*) 43 - 77 %   Neutro Abs 5.8  1.7 - 7.7 K/uL   Lymphocytes Relative 14  12 - 46 %   Lymphs Abs 1.0  0.7 - 4.0 K/uL   Monocytes Relative 5  3 - 12 %   Monocytes Absolute 0.3  0.1 - 1.0 K/uL   Eosinophils Relative 1  0 - 5 %   Eosinophils Absolute 0.1  0.0 - 0.7 K/uL   Basophils Relative 0  0 - 1 %   Basophils Absolute 0.0  0.0 - 0.1 K/uL  COMPREHENSIVE METABOLIC PANEL     Status: Abnormal   Collection Time    01/01/13 11:29 AM      Result Value Range   Sodium 140  135 - 145 mEq/L   Potassium 3.9  3.5 - 5.1 mEq/L   Chloride 104  96 - 112 mEq/L   CO2 24  19 - 32 mEq/L   Glucose, Bld 118 (*) 70 - 99 mg/dL   BUN 6  6 - 23 mg/dL   Creatinine, Ser 2.95  0.50 - 1.35 mg/dL   Comment: DELTA CHECK NOTED   Calcium 8.7  8.4 - 10.5 mg/dL   Total Protein 6.9  6.0 - 8.3 g/dL   Albumin 3.2 (*) 3.5 - 5.2 g/dL   AST 28  0 - 37 U/L   ALT 19  0 - 53 U/L   Alkaline Phosphatase 79  39 - 117 U/L   Total Bilirubin 0.1 (*) 0.3 - 1.2 mg/dL   GFR calc non Af Amer >90  >90 mL/min   GFR calc Af Amer >90  >90 mL/min   Comment:            The eGFR has been calculated     using the CKD EPI equation.     This calculation has not been     validated in all clinical     situations.     eGFR's persistently     <90 mL/min signify     possible Chronic Kidney Disease.  ETHANOL     Status: Abnormal   Collection Time    01/01/13 11:29 AM      Result Value Range   Alcohol, Ethyl (B) 99 (*) 0 - 11 mg/dL   Comment:            LOWEST DETECTABLE LIMIT FOR     SERUM ALCOHOL IS 11 mg/dL     FOR MEDICAL PURPOSES ONLY  APTT     Status: Abnormal   Collection Time    01/01/13 11:29 AM      Result Value Range   aPTT 41 (*) 24 - 37 seconds   Comment:            IF BASELINE aPTT IS ELEVATED,     SUGGEST PATIENT RISK ASSESSMENT  BE USED TO DETERMINE APPROPRIATE     ANTICOAGULANT THERAPY.  PROTIME-INR     Status: Abnormal   Collection Time    01/01/13 11:29 AM       Result Value Range   Prothrombin Time 16.8 (*) 11.6 - 15.2 seconds   INR 1.40  0.00 - 1.49    Radiological Exams on Admission: No results found.  Assessment/Plan Recurrent DVT Alcohol abuse Positive lupus anticoagulant Mild cellulitis/insect bite Depression  Recurrent DVT Patient has recurrent DVT and was recently discharged on xarelto. At this time I'm not sure whether he was compliant with his medications though he says he has been taking it twice a day as prescribed due to ongoing alcohol use. Patient also live in tents  in the woods and does not have good social support. Patient will be started on Lovenox full dose therapy and will discontinue as altered this time. Patient has been on Coumadin in the past which was stopped because of noncompliant issues. Xarelto has short half-life and if he missed a dose it would have caused extension of the DVT. I've called and discussed with vascular surgeon on call in no need of any intervention at this time and recommend to continue anticoagulation. Patient has been seen by Dr Gaylyn Rong, and he should be consulted in the morning to discuss further treatment plan.   Alcohol abuse Patient has ongoing alcohol abuse. He was seen at [facility], and was agitated requiring involuntary commitment. At this time given to put the patient on alcohol withdrawal protocol and will be requiring one-to-one sitter. Patient's alcohol level last night was 350 and today it is 99  Once patient is medically stable [facility].   Positive lupus anticoagulant Patient has positive lupus anticoagulant and requires long-term anticoagulation but because of his social situation and ongoing alcohol abuse he is not a good candidate for long-term Coumadin and may not be able to afford xarelto. We'll have to discuss with hematology before discharge  Mild cellulitis/insect bite Patient has mild cellulitis at the suprapubic region. Which  seems more like insect bite. I will start him on doxycycline 100 g by mouth twice a day  Depression  We'll get psych consult   DVT prophylaxis On therapeutic anticoagulation  Time Spent on Admission: 75 min  Latajah Thuman S [facility] Pager: [phone] 01/01/2013, 3:21 PM

## 2013-01-01 NOTE — Progress Notes (Signed)
ANTICOAGULATION CONSULT NOTE - Initial Consult  Pharmacy Consult for Lovenox Indication: Hx recurrent PE/DVT, +Lupus anticoagulant.   No Known Allergies  Patient Measurements:     Vital Signs: Temp: 98.2 F (36.8 C) (04/27 1047) Temp src: Oral (04/27 1047) BP: 143/83 mmHg (04/27 1406) Pulse Rate: 108 (04/27 1406)  Labs:  Recent Labs  01/01/13 0045 01/01/13 0100 01/01/13 1129  HGB 13.2 14.6 13.3  HCT 39.8 43.0 39.6  PLT 329  --  311  APTT  --   --  41*  LABPROT  --   --  16.8*  INR  --   --  1.40  CREATININE  --  1.30 0.75    The CrCl is unknown because both a height and weight (above a minimum accepted value) are required for this calculation.   Medical History: Past Medical History  Diagnosis Date  . Coronary artery disease   . Degenerative joint disease of spine   . Pulmonary embolism 06/2010  . Degenerative joint disease   . 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 ETOH abuse   . DVT (deep venous thrombosis)   . Hepatitis B   . Mental disorder   . Depression     Medications:  Prescriptions prior to admission  Medication Sig Dispense Refill  . oxyCODONE (OXY IR/ROXICODONE) 5 MG immediate release tablet Take 5 mg by mouth every 4 (four) hours as needed for pain.      . Rivaroxaban (XARELTO) 15 MG TABS tablet Take 1 tablet (15 mg total) by mouth 2 (two) times daily.  41 tablet  0  . traZODone (DESYREL) 50 MG tablet Take 1 tablet (50 mg total) by mouth at bedtime and may repeat dose one time if needed. For insomnia.  30 tablet  0   Scheduled:  . doxycycline  100 mg Oral Q12H  . [COMPLETED] enoxaparin  90 mg Subcutaneous Once  . enoxaparin  90 mg Subcutaneous Q12H  . folic acid  1 mg Oral Daily  . multivitamin with minerals  1 tablet Oral Daily  . banana bag IV 1000 mL ** MVI currently unavailable **   Intravenous Once  . thiamine  100 mg Oral Daily   Or  . thiamine  100 mg Intravenous Daily  . [DISCONTINUED] folic acid  1 mg Oral Daily  .  [DISCONTINUED] multivitamin with minerals  1 tablet Oral Daily  . [DISCONTINUED] thiamine  100 mg Intravenous Daily  . [DISCONTINUED] thiamine  100 mg Oral Daily  . [DISCONTINUED] ziprasidone  20 mg Intramuscular Once    Assessment:  47yo M presenting with left leg swelling. Recently put on Xarelto for recurrent PE/DVT. He is homeless and an alcoholic.  Pharmacy asked to dose full-dose Lovenox.  Recent weight 90kg.  SCr wnl, CrCl ~100.  CBC ok.  Goal of Therapy:  Monitor platelets by anticoagulation protocol: Yes   Plan:   Lovenox 1mg /kg SQ q12h - started in the ER.  F/u daily.  Check CBC q48h while on Lovenox.  Charolotte Eke, PharmD, pager [phone]. 01/01/2013,4:04 PM.

## 2013-01-02 DIAGNOSIS — R404 Transient alteration of awareness: Secondary | ICD-10-CM

## 2013-01-02 LAB — COMPREHENSIVE METABOLIC PANEL
BUN: 11 mg/dL (ref 6–23)
CO2: 26 mEq/L (ref 19–32)
Calcium: 8.2 mg/dL — ABNORMAL LOW (ref 8.4–10.5)
Chloride: 103 mEq/L (ref 96–112)
Creatinine, Ser: 0.69 mg/dL (ref 0.50–1.35)
GFR calc Af Amer: >90 mL/min (ref >90)
GFR calc non Af Amer: >90 mL/min (ref >90)
Glucose, Bld: 83 mg/dL (ref 70–99)
Total Bilirubin: 0.3 mg/dL (ref 0.3–1.2)

## 2013-01-02 LAB — CBC
HCT: 35.6 % — ABNORMAL LOW (ref 39.0–52.0)
MCH: 32.3 pg (ref 26.0–34.0)
MCV: 97.5 fL (ref 78.0–100.0)
Platelets: 276 10*3/uL (ref 150–400)
RBC: 3.65 MIL/uL — ABNORMAL LOW (ref 4.22–5.81)
WBC: 8.5 10*3/uL (ref 4.0–10.5)

## 2013-01-02 MED ORDER — PNEUMOCOCCAL VAC POLYVALENT 25 MCG/0.5ML IJ INJ
0.5000 mL | INJECTION | INTRAMUSCULAR | Status: AC
  Administered 2013-01-03: 0.5 mL via INTRAMUSCULAR
  Filled 2013-01-02 (×2): qty 0.5

## 2013-01-02 NOTE — Progress Notes (Signed)
   CARE MANAGEMENT NOTE 01/02/2013  Patient:  [name],[name]   Account Number:  [id number]  Date Initiated:  01/02/2013  Documentation initiated by:  Jiles Crocker  Subjective/Objective Assessment:   ADMITTED WITH DVT     Action/Plan:   [facility] 01/04/2013; DR HA- APT 01/29/2013; LIVES IN A TENT IN THE WOODS; HAS MEDICAID AS PRIMARY INSURANCE   Anticipated DC Date:  01/07/2013   Anticipated DC Plan:  SKILLED NURSING FACILITY  In-house referral  Clinical Social Worker      DC Planning Services  CM consult             Status of service:  In process, will continue to follow Medicare Important Message given?  NA - LOS <3 / Initial given by admissions (If response is "NO", the following Medicare IM given date fields will be blank) Per UR Regulation:  Reviewed for med. necessity/level of care/duration of stay Comments:  01/02/2013- Talked to patient about follow up medical care; patient [place] the woods. He stated that if he stayed in a shelter, they would put him out during the daytime and his legs are too weak to do a lot of walking; Patient has a follow up apt with Dr Gaylyn Rong for 01/29/2013 and the [facility] for January 04, 2013; Patient is agreeable to go to a facility for short term - he requested [facility]. Vinnie Level Worker made aware and is working on placement. Abelino Derrick RN,BSN,MHA

## 2013-01-02 NOTE — Progress Notes (Signed)
Talked to patient about follow up medical care; patient lives in a tent in the woods. He stated that if he stayed in a shelter, they would put him out during the daytime and his legs are too weak to do a lot of walking; Patient has a follow up apt with Dr Gaylyn Rong for 01/29/2013 and the [facility] for January 04, 2013; Patient is agreeable to go to a facility for short term - he requested [facility]. [facility] Level Worker made aware and is working on placement. Abelino Derrick RN,BSN,MHA

## 2013-01-02 NOTE — Progress Notes (Signed)
Clinical Social Work  CSW spoke with psych MD who requests to rescind IVC. MD signed Notice of Commitment Change form and CSW faxed form to Magistrate. CSW called Magistrate to confirm information was received.  Patient signed ROI forms for [facility] and [facility]. CSW called and left messages with admission's coordinators to determine if any beds are available for treatment. CSW will continue to follow to assist with dc planning.  [place], [place] [phone]

## 2013-01-02 NOTE — Consult Note (Signed)
Reason for Consult: IVC, SI, HI? Alcohol intoxication Referring Physician: Dr. Darnelle Going [name] is an 47 y.o. male.  HPI: This is a 47 y/o male with h/o positive lupus anticoagulant, pulmonary embolism, alcohol abuse, who was recently diagnosed with recurrent DVT. He was put on Xarelto and says he was taking it as prescribed. He [place], and drinks alcohol daily 1 pack per since age 11. He came to the ED for worsening swelling of left leg, and doppler ultrasound showed acute occlusive DVT in the common femoral and femoral veins of the lower extremity, and it is a new finding.  Per pt he wants drug rehab now as he wants to quit drinking. Denies any si, hi or avh now. Reports being depressed for many years not sure if etoh play role in it. He was angry yesterday but feels fine today.  Per pt he refused admission here recently because he was here and police brought to ED this time.   Interval History: Patient was calm and cooperative. 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 He has chronic depression but denied current SI/HI and psychosis. He has fine hand tremors, feeling hot/cold and mild sweating. He has no withdrawal seizures or hallucinations.    Past Medical History  Diagnosis Date  . Coronary artery disease   . Degenerative joint disease of spine   . Pulmonary embolism 06/2010  . Degenerative joint disease   . 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 ETOH abuse   . DVT (deep venous thrombosis)   . Hepatitis B   . Mental disorder   . Depression     Past Surgical History  Procedure Laterality Date  . Left elbow surgery    . Tonsillectomy      History reviewed. No pertinent family history.  Social History:  reports that he has been smoking Cigarettes.  He has a 60 pack-year smoking history. He  has never used smokeless tobacco. He reports that  drinks alcohol. He reports that he does not use illicit drugs.  Allergies: No Known Allergies  Medications: I have reviewed the patient's current medications.  Results for orders placed during the hospital encounter of 01/01/13 (from the past 48 hour(s))  CBC WITH DIFFERENTIAL     Status: Abnormal   Collection Time    01/01/13 11:29 AM      Result Value Range   WBC 7.2  4.0 - 10.5 K/uL   RBC 4.11 (*) 4.22 - 5.81 MIL/uL   Hemoglobin 13.3  13.0 - 17.0 g/dL   HCT 11.9  14.7 - 82.9 %   MCV 96.4  78.0 - 100.0 fL   MCH 32.4  26.0 - 34.0 pg   MCHC 33.6  30.0 - 36.0 g/dL   RDW 56.2 (*) 13.0 - 86.5 %   Platelets 311  150 - 400 K/uL   Neutrophils Relative 80 (*) 43 - 77 %   Neutro Abs 5.8  1.7 - 7.7 K/uL   Lymphocytes Relative 14  12 - 46 %   Lymphs Abs 1.0  0.7 - 4.0 K/uL   Monocytes Relative 5  3 - 12 %   Monocytes Absolute 0.3  0.1 - 1.0 K/uL   Eosinophils Relative 1  0 - 5 %   Eosinophils Absolute 0.1  0.0 - 0.7 K/uL  Basophils Relative 0  0 - 1 %   Basophils Absolute 0.0  0.0 - 0.1 K/uL  COMPREHENSIVE METABOLIC PANEL     Status: Abnormal   Collection Time    01/01/13 11:29 AM      Result Value Range   Sodium 140  135 - 145 mEq/L   Potassium 3.9  3.5 - 5.1 mEq/L   Chloride 104  96 - 112 mEq/L   CO2 24  19 - 32 mEq/L   Glucose, Bld 118 (*) 70 - 99 mg/dL   BUN 6  6 - 23 mg/dL   Creatinine, Ser 1.61  0.50 - 1.35 mg/dL   Comment: DELTA CHECK NOTED   Calcium 8.7  8.4 - 10.5 mg/dL   Total Protein 6.9  6.0 - 8.3 g/dL   Albumin 3.2 (*) 3.5 - 5.2 g/dL   AST 28  0 - 37 U/L   ALT 19  0 - 53 U/L   Alkaline Phosphatase 79  39 - 117 U/L   Total Bilirubin 0.1 (*) 0.3 - 1.2 mg/dL   GFR calc non Af Amer >90  >90 mL/min   GFR calc Af Amer >90  >90 mL/min   Comment:            The eGFR has been calculated     using the CKD EPI equation.     This calculation has not been     validated in all clinical     situations.     eGFR's  persistently     <90 mL/min signify     possible Chronic Kidney Disease.  ETHANOL     Status: Abnormal   Collection Time    01/01/13 11:29 AM      Result Value Range   Alcohol, Ethyl (B) 99 (*) 0 - 11 mg/dL   Comment:            LOWEST DETECTABLE LIMIT FOR     SERUM ALCOHOL IS 11 mg/dL     FOR MEDICAL PURPOSES ONLY  APTT     Status: Abnormal   Collection Time    01/01/13 11:29 AM      Result Value Range   aPTT 41 (*) 24 - 37 seconds   Comment:            IF BASELINE aPTT IS ELEVATED,     SUGGEST PATIENT RISK ASSESSMENT     BE USED TO DETERMINE APPROPRIATE     ANTICOAGULANT THERAPY.  PROTIME-INR     Status: Abnormal   Collection Time    01/01/13 11:29 AM      Result Value Range   Prothrombin Time 16.8 (*) 11.6 - 15.2 seconds   INR 1.40  0.00 - 1.49  CBC     Status: Abnormal   Collection Time    01/02/13  4:31 AM      Result Value Range   WBC 8.5  4.0 - 10.5 K/uL   RBC 3.65 (*) 4.22 - 5.81 MIL/uL   Hemoglobin 11.8 (*) 13.0 - 17.0 g/dL   HCT 09.6 (*) 04.5 - 40.9 %   MCV 97.5  78.0 - 100.0 fL   MCH 32.3  26.0 - 34.0 pg   MCHC 33.1  30.0 - 36.0 g/dL   RDW 81.1 (*) 91.4 - 78.2 %   Platelets 276  150 - 400 K/uL  COMPREHENSIVE METABOLIC PANEL     Status: Abnormal   Collection Time    01/02/13  4:31 AM  Result Value Range   Sodium 136  135 - 145 mEq/L   Potassium 4.0  3.5 - 5.1 mEq/L   Chloride 103  96 - 112 mEq/L   CO2 26  19 - 32 mEq/L   Glucose, Bld 83  70 - 99 mg/dL   BUN 11  6 - 23 mg/dL   Creatinine, Ser 1.61  0.50 - 1.35 mg/dL   Calcium 8.2 (*) 8.4 - 10.5 mg/dL   Total Protein 5.8 (*) 6.0 - 8.3 g/dL   Albumin 2.6 (*) 3.5 - 5.2 g/dL   AST 22  0 - 37 U/L   ALT 14  0 - 53 U/L   Alkaline Phosphatase 66  39 - 117 U/L   Total Bilirubin 0.3  0.3 - 1.2 mg/dL   GFR calc non Af Amer >90  >90 mL/min   GFR calc Af Amer >90  >90 mL/min   Comment:            The eGFR has been calculated     using the CKD EPI equation.     This calculation has not been      validated in all clinical     situations.     eGFR's persistently     <90 mL/min signify     possible Chronic Kidney Disease.    No results found.  ROS Blood pressure 133/80, pulse 78, temperature 98.2 F (36.8 C), temperature source Oral, resp. rate 20, height 6' 2.02" (1.88 m), weight 191 lb 6.4 oz (86.818 kg), SpO2 98.00%. Physical Exam  Mental Status Examination/Evaluation:   Appearance: on bed confused   Eye Contact::  poor   Speech: limited   Volume: low   Mood: sad   Affect: ristricted   Thought Process: organized   Orientation: 3/4 not times   Thought Content: denies AVH   Suicidal Thoughts: No   Homicidal Thoughts: no   Memory: Recent; Poor   Judgement: Impaired   Insight: Lacking   Psychomotor Activity: slow   Concentration: poor   Recall: poor   Akathisia: No   Assessment:   AXIS I: r/o Delirium  NOS, Alcohol Dep, AXIS II: Deferred   AXIS III: see emdical hx ? AXIS IV: problems related to social environment, problems with access to health care services and problems with primary support group   AXIS V: 35   Treatment Plan/Recommendations:   - will benefit from drug rehab after detox.  - Will consider [facility] if needed for safety after medical clearance.   -Currently he dies any si, hi or avh and will rescind IVC  - will recommend alcohol rehab services upon completing medical treatment  - will follow as needed  Kaylani Fromme,JANARDHAHA R. 01/02/2013, 1:11 PM

## 2013-01-02 NOTE — Progress Notes (Signed)
[facility] PROGRESS NOTE  [name] JYN:[id number] DOB: 05-Mar-1966 DOA: 01/01/2013 PCP: Default, Provider, MD  Assessment/Plan: Recurrent DVT  Patient has recurrent DVT and was recently discharged on xarelto. At this time I'm not sure whether he was compliant with his medications though he says he has been taking it twice a day as prescribed due to ongoing alcohol use. Patient also live in tents in the woods and does not have good social support. Patient will be started on Lovenox full dose therapy and will discontinue as altered this time. Patient has been on Coumadin in the past which was stopped because of noncompliant issues. Xarelto has short half-life and if he missed a dose it would have caused extension of the DVT. I've called and discussed with vascular surgeon on call in no need of any intervention at this time and recommend to continue anticoagulation. Patient has been seen by Dr Gaylyn Rong, and he should be consulted in the morning to discuss further treatment plan.  Called and discussed with Dr Arline Asp, who recommends to stop the xarelto and start lovenox. He has been noncompliant with coumadin in past. Will continue with full dose Lovenox.  Alcohol abuse  Patient has ongoing alcohol abuse. He was seen at [facility], and was agitated requiring involuntary commitment. At this time given to put the patient on alcohol withdrawal protocol and will be requiring one-to-one sitter. Patient's alcohol level last night was 350 and today it is 99  Once patient is medically stable he can be [facility].   Positive lupus anticoagulant  Patient has positive lupus anticoagulant and requires long-term anticoagulation but because of his social situation and ongoing alcohol abuse he is not a good candidate for long-term Coumadin and may not be able to afford xarelto.  Continue with Lovenox  Mild cellulitis/insect bite improving  Patient has mild cellulitis at the  suprapubic region. Which seems more like insect bite. I will start him on doxycycline 100 g by mouth twice a day   Depression  We'll get psych consult    Code Status: Full code Family Communication: Discussed with patient Disposition Plan: TBD   Consultants:  Hematology, called and discussed with Dr Arline Asp  Procedures:  None  Antibiotics:  None  HPI/Subjective: Patient admitted with DVT, cellulitis. He feels better. Leg swelling and redness is improving.  Objective: Filed Vitals:   01/01/13 1406 01/01/13 1600 01/01/13 2204 01/02/13 0437  BP: 143/83 138/78 128/80 133/80  Pulse: 108 98 84 78  Temp:  98.3 F (36.8 C) 98.1 F (36.7 C) 98.2 F (36.8 C)  TempSrc:  Oral Oral Oral  Resp: 18  18 20   Height:  6' 2.02" (1.88 m)    Weight:  86.818 kg (191 lb 6.4 oz)    SpO2: 98% 99% 100% 98%    Intake/Output Summary (Last 24 hours) at 01/02/13 1442 Last data filed at 01/02/13 1301  Gross per 24 hour  Intake 2471.25 ml  Output    400 ml  Net 2071.25 ml   Filed Weights   01/01/13 1600  Weight: 86.818 kg (191 lb 6.4 oz)    Exam:   General: Appear in no acute distress  Cardiovascular: s1s2 rrr  Respiratory: Clear bilaterally  Abdomen: soft, nontender  Musculoskeletal: Leg swelling and erythema has improved  Data Reviewed: Basic Metabolic Panel:  Recent Labs Lab 01/01/13 0100 01/01/13 1129 01/02/13 0431  NA 140 140 136  K 3.8 3.9 4.0  CL 109 104 103  CO2  --  24 26  GLUCOSE 97 118* 83  BUN <3* 6 11  CREATININE 1.30 0.75 0.69  CALCIUM  --  8.7 8.2*   Liver Function Tests:  Recent Labs Lab 01/01/13 1129 01/02/13 0431  AST 28 22  ALT 19 14  ALKPHOS 79 66  BILITOT 0.1* 0.3  PROT 6.9 5.8*  ALBUMIN 3.2* 2.6*   No results found for this basename: LIPASE, AMYLASE,  in the last 168 hours No results found for this basename: AMMONIA,  in the last 168 hours CBC:  Recent Labs Lab 01/01/13 0045 01/01/13 0100 01/01/13 1129 01/02/13 0431   WBC 9.7  --  7.2 8.5  NEUTROABS 6.7  --  5.8  --   HGB 13.2 14.6 13.3 11.8*  HCT 39.8 43.0 39.6 35.6*  MCV 95.9  --  96.4 97.5  PLT 329  --  311 276   Cardiac Enzymes: No results found for this basename: CKTOTAL, CKMB, CKMBINDEX, TROPONINI,  in the last 168 hours BNP (last 3 results)  Recent Labs  01/25/12 1316  PROBNP 75.9   CBG: No results found for this basename: GLUCAP,  in the last 168 hours  No results found for this or any previous visit (from the past 240 hour(s)).   Studies: No results found.  Scheduled Meds: . doxycycline  100 mg Oral Q12H  . enoxaparin  90 mg Subcutaneous Q12H  . folic acid  1 mg Oral Daily  . multivitamin with minerals  1 tablet Oral Daily  . [START ON 01/03/2013] pneumococcal 23 valent vaccine  0.5 mL Intramuscular Tomorrow-1000  . thiamine  100 mg Oral Daily   Or  . thiamine  100 mg Intravenous Daily   Continuous Infusions: . sodium chloride 75 mL/hr at 01/01/13 1645    Active Problems:   Alcohol abuse   Tobacco abuse   Lupus anticoagulant disorder   Deep vein thrombosis    Time spent: 25 min    [facility]  [facility] Pager [phone] If 7PM-7AM, please contact night-coverage at [email or web address], password [facility] 01/02/2013, 2:42 PM  LOS: 1 day

## 2013-01-02 NOTE — Progress Notes (Signed)
Clinical Social Work Department CLINICAL SOCIAL WORK PSYCHIATRY SERVICE LINE ASSESSMENT 01/02/2013  Patient:  [name]  Account:  [id number]  Admit Date:  01/01/2013  Clinical Social Worker:  Unk Lightning, LCSW  Date/Time:  01/02/2013 11:45 AM Referred by:  Physician  Date referred:  01/02/2013 Reason for Referral  Psychosocial assessment   Presenting Symptoms/Problems (In the person's/family's own words):   Psych consulted due to substance abuse and HI/SI   Abuse/Neglect/Trauma History (check all that apply)  Emotional abuse   Abuse/Neglect/Trauma Comments:   Patient reports that he grew up in foster care and never had a stable environment.   Psychiatric History (check all that apply)  Outpatient treatment   Psychiatric medications:  None   Current Mental Health Hospitalizations/Previous Mental Health History:   Patient reports he has felt depressed for several years. Patient contributes situational factors to depression. Patient has never received any treatment for depression.   Current provider:   None   Place and Date:   N/A   Current Medications:   LORazepam, LORazepam, oxyCODONE            . doxycycline  100 mg Oral Q12H  . enoxaparin  90 mg Subcutaneous Q12H  . folic acid  1 mg Oral Daily  . multivitamin with minerals  1 tablet Oral Daily  . [START ON 01/03/2013] pneumococcal 23 valent vaccine  0.5 mL Intramuscular Tomorrow-1000  . thiamine  100 mg Oral Daily   Or     . thiamine  100 mg Intravenous Daily   Previous Impatient Admission/Date/Reason:   Patient reports he has been to detox at [facility] in the past but cannot remember specific dates.   Emotional Health / Current Symptoms    Suicide/Self Harm  Other harmful behavior (ex: homicidal ideation) (describe)   Suicide attempt in the past:   Patient placed under IVC on 4/27 due making HI and SI. Patient reports that he was just frustrated and no longer experiencing any HI.  Patient reports no SI. Patient  reports he attempted to OD when he was a teenager but reports no further attempts.   Other harmful behavior:   Psychotic/Dissociative Symptoms  None reported   Other Psychotic/Dissociative Symptoms:    Attention/Behavioral Symptoms  Withdrawn   Other Attention / Behavioral Symptoms:   Patient has limited eye contact and speaks softly.    Cognitive Impairment  Within Normal Limits   Other Cognitive Impairment:    Mood and Adjustment  Flat    Stress, Anxiety, Trauma, Any Recent Loss/Stressor  Other - See comment   Anxiety (frequency):   Phobia (specify):   Compulsive behavior (specify):   Obsessive behavior (specify):   Other:   Patient is homeless.   Substance Abuse/Use  Current substance use  Substance abuse treatment needed   SBIRT completed (please refer for detailed history):  Y  Self-reported substance use:   Patient reports he drinks alcohol on daily basis. Patient reports he drinks a case of beer a day and usually fifth of liquor at night. Patient reports he has been drinking since he was 47 years old and has been drinking heavily for several years. Patient denies any other substance use.   Urinary Drug Screen Completed:  Y Alcohol level:   350    Environmental/Housing/Living Arrangement  HOMELESS   Who is in the home:   Patient lives in tent in the woods   Emergency contact:  None   Financial  Medicaid   Patient's Strengths and Goals (patient's own  words):   Patient reports he wants treatment in order to remain sober   Clinical Social Worker's Interpretive Summary:   CSW received referral to complete psychosocial assessment. CSW reviewed chart and met with patient at bedside. Patient agreeable to assessment.    Patient minimally engaged throughout assessment with limited eye contact. Patient has not attempted any treatment in the past but reports that he is interested now. Patient is also working with unit CSW regarding housing options to  increase chances of sobriety. Patient reports no HI or SI currently but is under IVC and was served on 01/01/13.    CSW introduced myself and explained role. Patient reports he went to [facility] in order to detox from alcohol but had to come to [facility] in order to get cleared. Patient reports he does not know he was under IVC but CSW explained that per chart review, patient had threatened staff and reported SI. Patient denies any SI or HI at this time.    Patient is single and is homeless. Patient lives with friends in the woods and is not compliant with medications. Patient consumes alcohol on daily basis in large amounts. Patient reports that he feels now is the time to work on being sober so that he can get a job and find permanent housing.    Patient has been for detox in the past but reports no treatment for outpatient or inpatient treatment. Patient reports that he begins drinking as soon as he wakes up and drinks throughout the night. Patient contributes a poor childhood and unstable environment to his beginning to drink at a young age. Patient reports no periods of sobriety in the past but reports he feels he needs inpatient in order to remain sober.    CSW will continue to follow to assist with dc planning. Psych MD needs to clarify if patient can dc to substance abuse treatment or if he needs psychiatric care prior to alcohol rehab. CSW will continue to follow.   Disposition:  Recommend Psych CSW continuing to support while in hospital

## 2013-01-03 DIAGNOSIS — F1020 Alcohol dependence, uncomplicated: Secondary | ICD-10-CM

## 2013-01-03 DIAGNOSIS — L03116 Cellulitis of left lower limb: Secondary | ICD-10-CM | POA: Diagnosis present

## 2013-01-03 DIAGNOSIS — L03119 Cellulitis of unspecified part of limb: Secondary | ICD-10-CM

## 2013-01-03 MED ORDER — TRAZODONE HCL 50 MG PO TABS
50.0000 mg | ORAL_TABLET | Freq: Every evening | ORAL | Status: DC | PRN
  Administered 2013-01-03: 50 mg via ORAL
  Filled 2013-01-03: qty 1

## 2013-01-03 NOTE — Progress Notes (Signed)
Clinical Social Work  CSW called [facility] again to follow up on referral. Admissions report they need additional time to review referral. CSW will continue to follow up.  [place], [place] [phone]

## 2013-01-03 NOTE — Progress Notes (Signed)
[facility] PROGRESS NOTE  [name] GEX:[id number] DOB: December 17, 1965 DOA: 01/01/2013 PCP: Default, Provider, MD  Interval history 47 y/o male with h/o positive lupus anticoagulant, pulmonary embolism, alcohol abuse, who was recently diagnosed with recurrent DVT and was seen by Dr Gaylyn Rong on 12/22/12. He was put on Xarelto and says he was taking it as prescribed. He lives in [place] on woods, and drinks alcohol daily. Today he came to the ED for worsening swelling of left leg, and doppler ultrasound showed acute occlusive DVT in the common femoral and femoral veins of the lower extremity, and it is a new finding.Patient also has swelling of the skin in the pubic area, which he says developed recently. No injury as per patient, he is not sexually active. Called and discussed with Dr Arline Asp, who recommends to stop the xarelto and start lovenox. He has been noncompliant with coumadin in past. Will continue with full dose Lovenox.     Assessment/Plan: Recurrent DVT  Patient has recurrent DVT and was recently discharged on xarelto. At this time I'm not sure whether he was compliant with his medications though he says he has been taking it twice a day as prescribed due to ongoing alcohol use. Patient also live in tents in the woods and does not have good social support. Patient will be started on Lovenox full dose therapy and will discontinue xarelto at  this time. Patient has been on Coumadin in the past which was stopped because of noncompliant issues. Xarelto has short half-life and if he missed a dose it would have caused extension of the DVT. I've called and discussed with vascular surgeon on call in no need of any intervention at this time and recommend to continue anticoagulation. Patient has been seen by Dr Gaylyn Rong, and he should be consulted in the morning to discuss further treatment plan.  Called and discussed with Dr Arline Asp, who recommends to stop the xarelto and start lovenox. He has been  noncompliant with coumadin in past. Will continue with full dose Lovenox.  Alcohol abuse  Patient has ongoing alcohol abuse. He was seen at [facility], and was agitated requiring involuntary commitment. At this time given to put the patient on alcohol withdrawal protocol and will be requiring one-to-one sitter. Patient's alcohol level last night was 350 and today it is 99  Once stable he will be discharged to alcohol rehab  Positive lupus anticoagulant  Patient has positive lupus anticoagulant and requires long-term anticoagulation but because of his social situation and ongoing alcohol abuse he is not a good candidate for long-term Coumadin and may not be able to afford xarelto.  Continue with Lovenox, Patient has an appointment to see Dr Gaylyn Rong on 01/29/13, and should continue with daily Lovenox injections till that time, and he can discuss other options with Dr Gaylyn Rong at that time  Mild Left legcellulitis/insect bite improving , patient's left leg is now much improved with less erythema and swelling Patient has mild cellulitis at the suprapubic region. Which seems more like insect bite.  Continue with Doxycycline 100 mg po BID  Depression  We'll get psych consult    Code Status: Full code Family Communication: Discussed with patient Disposition Plan: TBD   Consultants:  Hematology, called and discussed with Dr Arline Asp  Procedures:  None  Antibiotics:  None  HPI/Subjective: Patient admitted with DVT, cellulitis. He feels better. Leg swelling and redness is improving.   Objective: Filed Vitals:   01/02/13 0102 01/02/13 1453 01/02/13 2245 01/03/13 7253  BP: 133/80 139/73 128/64 134/68  Pulse: 78 65 59 64  Temp: 98.2 F (36.8 C) 98 F (36.7 C) 97.3 F (36.3 C) 98 F (36.7 C)  TempSrc: Oral Oral Oral Oral  Resp: 20 18 18 18   Height:      Weight:      SpO2: 98% 99% 100% 66%    Intake/Output Summary (Last 24 hours) at 01/03/13 1230 Last data filed at  01/03/13 0981  Gross per 24 hour  Intake   3505 ml  Output      0 ml  Net   3505 ml   Filed Weights   01/01/13 1600  Weight: 86.818 kg (191 lb 6.4 oz)    Exam:   General: Appear in no acute distress  Cardiovascular: s1s2 rrr  Respiratory: Clear bilaterally  Abdomen: soft, nontender  Musculoskeletal: Leg swelling and erythema has improved  Data Reviewed: Basic Metabolic Panel:  Recent Labs Lab 01/01/13 0100 01/01/13 1129 01/02/13 0431  NA 140 140 136  K 3.8 3.9 4.0  CL 109 104 103  CO2  --  24 26  GLUCOSE 97 118* 83  BUN <3* 6 11  CREATININE 1.30 0.75 0.69  CALCIUM  --  8.7 8.2*   Liver Function Tests:  Recent Labs Lab 01/01/13 1129 01/02/13 0431  AST 28 22  ALT 19 14  ALKPHOS 79 66  BILITOT 0.1* 0.3  PROT 6.9 5.8*  ALBUMIN 3.2* 2.6*   No results found for this basename: LIPASE, AMYLASE,  in the last 168 hours No results found for this basename: AMMONIA,  in the last 168 hours CBC:  Recent Labs Lab 01/01/13 0045 01/01/13 0100 01/01/13 1129 01/02/13 0431  WBC 9.7  --  7.2 8.5  NEUTROABS 6.7  --  5.8  --   HGB 13.2 14.6 13.3 11.8*  HCT 39.8 43.0 39.6 35.6*  MCV 95.9  --  96.4 97.5  PLT 329  --  311 276   Cardiac Enzymes: No results found for this basename: CKTOTAL, CKMB, CKMBINDEX, TROPONINI,  in the last 168 hours BNP (last 3 results)  Recent Labs  01/25/12 1316  PROBNP 75.9   CBG: No results found for this basename: GLUCAP,  in the last 168 hours  No results found for this or any previous visit (from the past 240 hour(s)).   Studies: No results found.  Scheduled Meds: . doxycycline  100 mg Oral Q12H  . enoxaparin  90 mg Subcutaneous Q12H  . folic acid  1 mg Oral Daily  . multivitamin with minerals  1 tablet Oral Daily  . thiamine  100 mg Oral Daily   Or  . thiamine  100 mg Intravenous Daily   Continuous Infusions: . sodium chloride 75 mL/hr at 01/02/13 1500    Active Problems:   Alcohol abuse   Tobacco abuse    Lupus anticoagulant disorder   Deep vein thrombosis    Time spent: 25 min    [facility]  [facility] Pager [phone] If 7PM-7AM, please contact night-coverage at [email or web address], password [facility] 01/03/2013, 12:30 PM  LOS: 2 days

## 2013-01-03 NOTE — Progress Notes (Signed)
Clinical Social Work  CSW spoke with [facility] who reports that patient has not completed detox and could not accept until day 3 of detox. CSW will follow up with [facility] tomorrow. CSW text paged MD regarding treatment. CSW will continue to follow.  [place], [place] [phone]

## 2013-01-03 NOTE — Evaluation (Signed)
Physical Therapy One Time Evaluation Patient Details Name: [name] MRN: [id number] DOB: 03/17/1966 Today's Date: 01/03/2013 Time: 4098-1191 PT Time Calculation (min): 10 min  PT Assessment / Plan / Recommendation Clinical Impression  Pt is a 47 year old male admitted for L LE DVT and alcohol abuse.  Pt lives in tent and will likely d/c to inpatient detox center.  Pt is currently supervision to modified independent for mobility and agrees to not require skilled PT services at this time.    PT Assessment  Patent does not need any further PT services    Follow Up Recommendations  No PT follow up    Does the patient have the potential to tolerate intense rehabilitation      Barriers to Discharge        Equipment Recommendations  None recommended by PT    Recommendations for Other Services     Frequency      Precautions / Restrictions Precautions Precautions: None   Pertinent Vitals/Pain Pt reports slight pain in L LE however states redness, pain, and swelling in L LE much better today compared to admission.     Mobility  Bed Mobility Bed Mobility: Supine to Sit Supine to Sit: 7: Independent Transfers Transfers: Sit to Stand;Stand to Sit Sit to Stand: 6: Modified independent (Device/Increase time) Stand to Sit: 6: Modified independent (Device/Increase time) Ambulation/Gait Ambulation/Gait Assistance: 5: Supervision Ambulation Distance (Feet): 450 Feet Assistive device: None Ambulation/Gait Assistance Details: steady gait just slow pace, able to turn head and look around environment without unsteadiness, pt reports LEs feel fine with ambulation Gait Pattern: Within Functional Limits Gait velocity: decreased    Exercises     PT Diagnosis:    PT Problem List:   PT Treatment Interventions:     PT Goals    Visit Information  Last PT Received On: 01/03/13 Assistance Needed: +1    Subjective Data  Subjective: It looks much better since I came in.  (L calf)    Prior Functioning  Home Living Lives With: Alone Type of Home: Homeless Home Adaptive Equipment: None Additional Comments: lives in tent Prior Function Level of Independence: Independent Communication Communication: No difficulties    Cognition  Cognition Arousal/Alertness: Awake/alert Behavior During Therapy: WFL for tasks assessed/performed Overall Cognitive Status: Within Functional Limits for tasks assessed    Extremity/Trunk Assessment Right Upper Extremity Assessment RUE ROM/Strength/Tone: [facility] for tasks assessed Left Upper Extremity Assessment LUE ROM/Strength/Tone: WFL for tasks assessed Right Lower Extremity Assessment RLE ROM/Strength/Tone: [facility] for tasks assessed Left Lower Extremity Assessment LLE ROM/Strength/Tone: WFL for tasks assessed   Balance    End of Session PT - End of Session Activity Tolerance: Patient tolerated treatment well Patient left: in chair;with call bell/phone within reach;Other (comment) (with sitter)  GP     Maida Sale E 01/03/2013, 1:57 PM Zenovia Jarred, PT, DPT 01/03/2013 Pager: [phone]

## 2013-01-03 NOTE — Progress Notes (Signed)
Clinical Social Work  CSW spoke with MD who reports that patient is ready to dc for treatment. CSW sent referral to [facility] who is reviewing information and talking with RN to determine if patient can receive treatment if receiving Lovenox injections. CSW will continue to follow and will wait for [facility]'s decision.  CSW spoke with [facility] who reports no available beds until 5/14. If [facility] is unable to accept then CSW will place patient on [facility]'s waiting list.  Unk Lightning, LCSW [phone]

## 2013-01-03 NOTE — Progress Notes (Signed)
Clinical Social Work  CSW called [facility] again who reports MD still reviewing information. [facility] agreeable to call CSW back before 5pm. CSW will continue to follow.  [place], [place] [phone]

## 2013-01-04 ENCOUNTER — Other Ambulatory Visit: Payer: Self-pay | Admitting: Oncology

## 2013-01-04 DIAGNOSIS — F10239 Alcohol dependence with withdrawal, unspecified: Secondary | ICD-10-CM

## 2013-01-04 DIAGNOSIS — F1010 Alcohol abuse, uncomplicated: Secondary | ICD-10-CM

## 2013-01-04 DIAGNOSIS — I82409 Acute embolism and thrombosis of unspecified deep veins of unspecified lower extremity: Principal | ICD-10-CM

## 2013-01-04 DIAGNOSIS — R109 Unspecified abdominal pain: Secondary | ICD-10-CM

## 2013-01-04 LAB — CBC
Hemoglobin: 11.2 g/dL — ABNORMAL LOW (ref 13.0–17.0)
MCH: 31.3 pg (ref 26.0–34.0)
Platelets: 206 10*3/uL (ref 150–400)
RBC: 3.58 MIL/uL — ABNORMAL LOW (ref 4.22–5.81)
WBC: 8.9 10*3/uL (ref 4.0–10.5)

## 2013-01-04 MED ORDER — FOLIC ACID 1 MG PO TABS: 1.0000 mg | ORAL_TABLET | Freq: Every day | ORAL | Status: DC

## 2013-01-04 MED ORDER — ENOXAPARIN SODIUM 100 MG/ML ~~LOC~~ SOLN: 90.0000 mg | Freq: Two times a day (BID) | SUBCUTANEOUS | Status: DC

## 2013-01-04 MED ORDER — TRAZODONE HCL 50 MG PO TABS: 50.0000 mg | ORAL_TABLET | Freq: Every evening | ORAL | Status: DC | PRN

## 2013-01-04 MED ORDER — OXYCODONE HCL 5 MG PO TABS: 5.0000 mg | ORAL_TABLET | ORAL | Status: DC | PRN

## 2013-01-04 MED ORDER — DOXYCYCLINE HYCLATE 100 MG PO TABS: 100.0000 mg | ORAL_TABLET | Freq: Two times a day (BID) | ORAL | Status: DC

## 2013-01-04 NOTE — Progress Notes (Signed)
Clinical Social Work  CSW spoke with [facility] who will accept patient today. CSW informed MD who is working on [facility]. CSW spoke with CM in regards to medication assistance. Patient has to have medications in hand in order to be admitted to [facility]. [facility] will provide transportation to their facility.  [place], [place] [phone]

## 2013-01-04 NOTE — Consult Note (Signed)
[facility] INPATIENT PROGRESS NOTE  Name: [name]      MRN: [id number]    Location: 1422/1422-01  Date: 01/04/2013 Time:12:34 PM   Subjective: Interval History:[name] reported that he has had left distal leg swelling, edema, pain for the past few weeks.  Despite taking Xarelto without missing dose, per his report, the swelling and pain worsened.  Being on Lovenox for the past few days, his leg pain has slightly improved. He denied fever, mucositis, SOB, chest pain, abd pain, bleeding symptoms.  He has been drinking EtOH still.   Objective: Vital signs in last 24 hours: Temp:  [98.1 F (36.7 C)-98.4 F (36.9 C)] 98.2 F (36.8 C) (04/30 0516) Pulse Rate:  [56-60] 56 (04/30 0516) Resp:  [16-18] 16 (04/30 0516) BP: (128-141)/(60-76) 141/76 mmHg (04/30 0516) SpO2:  [56 %-98 %] 98 % (04/30 0516)    PHYSICAL EXAMINATION: General: well nourished male no acute distress. Eyes: no scleral icterus. ENT: There were no oropharyngeal lesions. Neck was without thyromegaly. Lymphatics: Negative cervical, supraclavicular or axillary adenopathy. Respiratory: lungs were clear bilaterally without wheezing or crackles. Cardiovascular: Regular rate and rhythm, S1/S2, without murmur, rub or gallop. There was no pedal edema to the right leg. Left lower extremity with 2+ edema with venous ulceration but no active cellulitis, increased warmth, or purulent discharge. There was slight pain in the right calf and thigh on palpation. GI: abdomen was soft, flat, nontender, nondistended, without organomegaly. Muscoloskeletal: no spinal tenderness of palpation of vertebral spine. Skin exam was without echymosis, petichae. Neuro exam was nonfocal. Patient was able to get on and off exam table without assistance. Gait was normal. Patient was alert and oriented. Attention was good. Language was appropriate. Mood was normal without depression.    Studies/Results: Results for orders placed during the hospital  encounter of 01/01/13 (from the past 48 hour(s))  CBC     Status: Abnormal   Collection Time    01/04/13 12:05 AM      Result Value Range   WBC 8.9  4.0 - 10.5 K/uL   RBC 3.58 (*) 4.22 - 5.81 MIL/uL   Hemoglobin 11.2 (*) 13.0 - 17.0 g/dL   HCT 78.2 (*) 95.6 - 21.3 %   MCV 96.6  78.0 - 100.0 fL   MCH 31.3  26.0 - 34.0 pg   MCHC 32.4  30.0 - 36.0 g/dL   RDW 08.6 (*) 57.8 - 46.9 %   Platelets 206  150 - 400 K/uL   Comment: DELTA CHECK NOTED     REPEATED TO VERIFY   No results found.   MEDICATIONS: reviewed.     PROBLEM LIST:  1.  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 2.  Last DVT in left low extremity in 12/11/2012.  He was started on Xarelto. 3.  Propagation of left low extremity DVT since 12/11/2012. 4.  Questionable adherence to anticoagulation. 5.  Alcohol abuse.    IMPRESSION AND PLAN:  He needs lifelong anticoagulation given lupus anticoagulant positive and recurrent thrombosis.  He did not fail Coumadin.  He just did not keep appointments for monitoring.  Therefore, we switched him to Xarelto earlier this month.  He now still has propagation of the previous clot.  I think that the best agent is Lovenox (1mg /kg SQ BID or 1.5 mg/kg SQ daily).  He is being discharged to rehab/EtOH detox.  However, in the future, when he is homeless again, he has no way to keep a supply of  Lovenox, and we will need to switch him back to Coumadin if he agrees to follow up.  He has appointment at the [facility] on 01/19/2013.  I encouraged him to keep this appointment.

## 2013-01-04 NOTE — Discharge Summary (Addendum)
Physician Discharge Summary  [name] WUJ:[id number] DOB: 03-15-1966 DOA: 01/01/2013  PCP: Default, Provider, MD  Admit date: 01/01/2013 Discharge date: 01/04/2013  Recommendations for Outpatient Follow-up:  1. Pt will need to follow up with PCP in 2-3 weeks post discharge 2. Please obtain BMP to evaluate electrolytes and kidney function 3. Please also check CBC to evaluate Hg and Hct levels 4. Please note that pt was discharged on Doxycycline to complete the therapy for 7 more days post discharge 5. Please also note that [name] was discontinued as pt was considered to have failed the therapy  6. Pt was started on Lovenox injections as per [name]. Arline Asp recommendations (heme onc) 7. Pt has an appointment with [name] 01/19/2013  Discharge Diagnoses: Acute DVT Active Problems:   Alcohol abuse   Tobacco abuse   Lupus anticoagulant disorder   Deep vein thrombosis   Cellulitis of leg, left  Discharge Condition: Stable  Diet recommendation: Heart healthy diet discussed in details   Interval history  47 y/o male with h/o positive lupus anticoagulant, pulmonary embolism, alcohol abuse, who was recently diagnosed with recurrent DVT and was seen by [name] on 12/22/12. He was put on Xarelto and says he was taking it as prescribed. [place], and drinks alcohol daily.  Came to the ED for worsening swelling of left leg, and doppler ultrasound showed acute occlusive DVT in the common femoral and femoral veins of the lower extremity, and it is a new finding. Patient also has swelling of the skin in the pubic area, which he says developed recently. No injury as per patient, he is not sexually active.  Discussed with [name] [name] Asp, who recommended to stop the xarelto and start lovenox. He has been noncompliant with coumadin in past. Will continue with full dose Lovenox.   Assessment/Plan:  Recurrent DVT  Patient has recurrent DVT and was recently discharged on xarelto. Patient also live  in tents in the woods and does not have good social support. Patient will be started on Lovenox full dose therapy and will discontinue xarelto at this time. Patient has been on Coumadin in the past which was stopped because of noncompliant issues. Xarelto has short half-life and if he missed a dose it would have caused extension of the DVT.  Discussed with vascular surgeon on call in no need of any intervention at this time and recommened to continue anticoagulation. Patient has been seen by [name], and will follow up with him in next month, appointment has been scheduled.  \[name], recommends to stop the xarelto and start lovenox. He has been noncompliant with coumadin in past. Will continue with full dose Lovenox.  Alcohol abuse  Patient has ongoing alcohol abuse. He was seen at [facility], and was agitated requiring involuntary commitment. At this time given to put the patient on alcohol withdrawal protocol and will be requiring one-to-one sitter. Patient's alcohol level was 350 and has continued to trend down. Positive lupus anticoagulant  Patient has positive lupus anticoagulant and requires long-term anticoagulation but because of his social situation and ongoing alcohol abuse he is not a good candidate for long-term Coumadin and may not be able to afford xarelto.  Continue with Lovenox, Patient has an appointment to see [name] on 01/19/13, and should continue with daily Lovenox injections till that time, and he can discuss other options with [name] at that time  Mild Left legcellulitis/insect bite  improving , patient's left leg is now  much improved with less erythema and swelling  Patient has mild cellulitis at the suprapubic region. Which seems more like insect bite.  Continue with Doxycycline 100 mg po BID for 7 more days post discharge  Depression  At baseline, sitter at bedside provided   Code Status: Full code  Family Communication: Discussed with patient   Consultants:   Hematology, called and discussed with [name] [name] Asp Antibiotics:  Doxycycline to complete for 7 more days post discharge    Procedures/Studies: Dg Chest 2 View 12/11/2012    IMPRESSION: No evidence of active pulmonary disease.  Stable chronic changes since previous study.     Ct Angio Chest Pe W/cm &/or Wo Cm 12/11/2012   1.  No evidence for acute pulmonary embolus.  2.  Mild emphysema.  3.  Prior granulomatous disease    Ct Abdomen Pelvis W Contrast 12/18/2012   1.  DVT in left external iliac and common femoral veins, with increased venous collaterals and varices in the inguinal and suprapubic regions.  2.  IVC filter in appropriate position.  No evidence of retroperitoneal hemorrhage.  3.  Moderate to severe hepatic steatosis.     Discharge Exam: Filed Vitals:   01/04/13 0516  BP: 141/76  Pulse: 56  Temp: 98.2 F (36.8 C)  Resp: 16   Filed Vitals:   01/03/13 0641 01/03/13 1350 01/03/13 2146 01/04/13 0516  BP: 134/68 128/63 131/60 141/76  Pulse: 64 56 60 56  Temp: 98 F (36.7 C) 98.4 F (36.9 C) 98.1 F (36.7 C) 98.2 F (36.8 C)  TempSrc: Oral Oral Oral Oral  Resp: 18 18 18 16   Height:      Weight:      SpO2: 66% 56% 98% 98%    General: Pt is alert, follows commands appropriately, not in acute distress Cardiovascular: Regular rate and rhythm, S1/S2 +, no murmurs, no rubs, no gallops Respiratory: Clear to auscultation bilaterally, no wheezing, no crackles, no rhonchi Abdominal: Soft, non tender, non distended, bowel sounds +, no guarding Extremities: trace bilateral pitting edema, no cyanosis, pulses palpable bilaterally DP and PT, erythema improving  Neuro: Grossly nonfocal  Discharge Instructions  Discharge Orders   Future Appointments Provider Department Dept Phone   01/19/2013 1:15 PM [name] [facility] [phone]   01/19/2013 1:45 PM [name] Ser, NP [facility] [phone]    Future Orders Complete By Expires     Diet - low sodium heart healthy  As directed     Increase activity slowly  As directed         Medication List    STOP taking these medications       Rivaroxaban 15 MG Tabs tablet  Commonly known as:  XARELTO      TAKE these medications       doxycycline 100 MG tablet  Commonly known as:  VIBRA-TABS  Take 1 tablet (100 mg total) by mouth every 12 (twelve) hours.     enoxaparin 100 MG/ML injection  Commonly known as:  LOVENOX  Inject 0.9 mLs (90 mg total) into the skin every 12 (twelve) hours.     folic acid 1 MG tablet  Commonly known as:  FOLVITE  Take 1 tablet (1 mg total) by mouth daily.     oxyCODONE 5 MG immediate release tablet  Commonly known as:  Oxy IR/ROXICODONE  Take 1 tablet (5 mg total) by mouth every 4 (four) hours as needed for pain.  traZODone 50 MG tablet  Commonly known as:  DESYREL  Take 1 tablet (50 mg total) by mouth at bedtime and may repeat dose one time if needed. For insomnia.           Follow-up Information   Follow up with Default, Provider, MD. (provider at the facility in 1 -2 weeks)    Contact information:   [address] [place] [address] [phone]       Follow up with Jethro Bolus, MD On 01/30/2013.   Contact information:   [address] [place] [address] [phone]        The results of significant diagnostics from this hospitalization (including imaging, microbiology, ancillary and laboratory) are listed below for reference.     Microbiology: No results found for this or any previous visit (from the past 240 hour(s)).   Labs: Basic Metabolic Panel:  Recent Labs Lab 01/01/13 0100 01/01/13 1129 01/02/13 0431  NA 140 140 136  K 3.8 3.9 4.0  CL 109 104 103  CO2  --  24 26  GLUCOSE 97 118* 83  BUN <3* 6 11  CREATININE 1.30 0.75 0.69  CALCIUM  --  8.7 8.2*   Liver Function Tests:  Recent Labs Lab 01/01/13 1129 01/02/13 0431  AST 28 22  ALT 19 14   ALKPHOS 79 66  BILITOT 0.1* 0.3  PROT 6.9 5.8*  ALBUMIN 3.2* 2.6*   CBC:  Recent Labs Lab 01/01/13 0045 01/01/13 0100 01/01/13 1129 01/02/13 0431 01/04/13 0005  WBC 9.7  --  7.2 8.5 8.9  NEUTROABS 6.7  --  5.8  --   --   HGB 13.2 14.6 13.3 11.8* 11.2*  HCT 39.8 43.0 39.6 35.6* 34.6*  MCV 95.9  --  96.4 97.5 96.6  PLT 329  --  311 276 206    BNP (last 3 results)  Recent Labs  01/25/12 1316  PROBNP 75.9   SIGNED: Time coordinating discharge: Over 30 minutes  Debbora Presto, MD  [facility] 01/04/2013, 10:36 AM Pager [phone]  If 7PM-7AM, please contact night-coverage [email or web address] Password [email or web address]

## 2013-01-04 NOTE — Progress Notes (Signed)
Clinical Social Work  CSW spoke with [facility] who reports that MD will accept patient with Lovenox injections. [facility] will call CSW in order to complete pre-screen for patient. CSW will continue to follow.  [place], [place] [phone]

## 2013-01-04 NOTE — Progress Notes (Signed)
Clinical Social Work  CM reports medications have been filled. CSW called [facility] to set up transportation. Patient informed patient and RN of plans. CSW is signing off but available if further needs arise.  [place], [place] [phone]

## 2013-01-04 NOTE — Progress Notes (Signed)
Patient discharged  To [facility], pick up by rehab facility staff.Patient no complaints of any pain or discomfort upon discharge. Discharge medications , Oxy and trazadone specifically endorsed  To the transport staff.

## 2013-01-04 NOTE — Progress Notes (Signed)
Clinical Social Work  CSW received a call from [facility] reporting that patient arrived to facility and refused admission. [facility] reports they are transporting patient back to ED. CSW explained that patient was dc from hospital. [facility] reports that they must transport him back to ED. CSW alerted ED CSW that patient was dc in plans to receive treatment at [facility] and that he refused treatment once he arrived. Please call with further questions regarding this patient [phone].  [place], [place]

## 2013-01-19 ENCOUNTER — Telehealth: Payer: Self-pay | Admitting: Oncology

## 2013-01-19 ENCOUNTER — Encounter: Payer: Self-pay | Admitting: Oncology

## 2013-01-19 ENCOUNTER — Ambulatory Visit (HOSPITAL_BASED_OUTPATIENT_CLINIC_OR_DEPARTMENT_OTHER): Payer: Medicaid Other | Admitting: Oncology

## 2013-01-19 ENCOUNTER — Other Ambulatory Visit (HOSPITAL_BASED_OUTPATIENT_CLINIC_OR_DEPARTMENT_OTHER): Payer: Medicaid Other | Admitting: Lab

## 2013-01-19 VITALS — BP 149/90 | HR 75 | Temp 97.4°F | Resp 18 | Ht 74.0 in | Wt 198.7 lb

## 2013-01-19 DIAGNOSIS — Z590 Homelessness: Secondary | ICD-10-CM

## 2013-01-19 DIAGNOSIS — D6862 Lupus anticoagulant syndrome: Secondary | ICD-10-CM

## 2013-01-19 DIAGNOSIS — D6859 Other primary thrombophilia: Secondary | ICD-10-CM

## 2013-01-19 DIAGNOSIS — L02219 Cutaneous abscess of trunk, unspecified: Secondary | ICD-10-CM

## 2013-01-19 DIAGNOSIS — I82403 Acute embolism and thrombosis of unspecified deep veins of lower extremity, bilateral: Secondary | ICD-10-CM

## 2013-01-19 DIAGNOSIS — F1010 Alcohol abuse, uncomplicated: Secondary | ICD-10-CM

## 2013-01-19 DIAGNOSIS — I82409 Acute embolism and thrombosis of unspecified deep veins of unspecified lower extremity: Secondary | ICD-10-CM

## 2013-01-19 LAB — CBC WITH DIFFERENTIAL/PLATELET
Basophils Absolute: 0 10*3/uL (ref 0.0–0.1)
Eosinophils Absolute: 0.2 10*3/uL (ref 0.0–0.5)
HGB: 14.2 g/dL (ref 13.0–17.1)
LYMPH%: 25.6 % (ref 14.0–49.0)
MCV: 96.7 fL (ref 79.3–98.0)
MONO#: 0.5 10*3/uL (ref 0.1–0.9)
MONO%: 6.3 % (ref 0.0–14.0)
NEUT#: 5.4 10*3/uL (ref 1.5–6.5)
Platelets: 234 10*3/uL (ref 140–400)
RDW: 16.1 % — ABNORMAL HIGH (ref 11.0–14.6)
WBC: 8.2 10*3/uL (ref 4.0–10.3)

## 2013-01-19 LAB — COMPREHENSIVE METABOLIC PANEL (CC13)
ALT: 14 U/L (ref 0–55)
CO2: 19 mEq/L — ABNORMAL LOW (ref 22–29)
Calcium: 8.9 mg/dL (ref 8.4–10.4)
Chloride: 109 mEq/L — ABNORMAL HIGH (ref 98–107)
Glucose: 82 mg/dl (ref 70–99)
Sodium: 139 mEq/L (ref 136–145)
Total Bilirubin: 0.25 mg/dL (ref 0.20–1.20)
Total Protein: 7.8 g/dL (ref 6.4–8.3)

## 2013-01-19 MED ORDER — DOXYCYCLINE HYCLATE 100 MG PO TABS: 100.0000 mg | ORAL_TABLET | Freq: Two times a day (BID) | ORAL | Status: DC

## 2013-01-19 MED ORDER — ENOXAPARIN SODIUM 100 MG/ML ~~LOC~~ SOLN: 90.0000 mg | Freq: Two times a day (BID) | SUBCUTANEOUS | Status: DC

## 2013-01-19 NOTE — Progress Notes (Signed)
[facility]  [phone] [phone]   OFFICE PROGRESS NOTE   Cc:  Default, Provider, MD  DIAGNOSIS: Recurrent DVT of the Left lower extremity. Patient is lupus anticoagulant positive. Patient has a history of pulmonary embolism in October 2011.  PAST THERAPY: Patient was very noncompliant with Coumadin monitoring due to lack of transportation and being homeless. S/P IVC filter placement in October 2011. He was started on Xarelto in April 2014 but this was stopped due to propagation of his left lower extremity DVT.  CURRENT THERAPY: Lovenox 90 mg twice a day started in April 2014  INTERVAL HISTORY: [name] 47 y.o. male returns for routine follow-up by himself. He was recently discharged from the hospital on Lovenox twice a day. States that he has been doing well on this with improved pain in his left lower extremity, decreased swelling, and decreased redness. Reports that he has not missed any doses of Lovenox.  He has completed a course of doxycycline and for mild cellulitis in his suprapubic region. Area is better, but he still has some mild swelling and does not think the infection is completely cleared. He still drinks about 6 beers a day. He still homeless he lives in a tent in the woods. He denies any headache, fever, shortness of breath, dyspnea on exertion, nausea vomiting, bleeding symptoms. The rest of the 14 point review of system was negative.   Past Medical History  Diagnosis Date  . Coronary artery disease   . Degenerative joint disease of spine   . Pulmonary embolism 06/2010  . Degenerative joint disease   . 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 ETOH abuse   . DVT (deep venous thrombosis)   . Hepatitis B   . Mental disorder   . Depression     Past Surgical History  Procedure Laterality Date  . Left elbow surgery    . Tonsillectomy      Current Outpatient Prescriptions  Medication Sig Dispense Refill  .  doxycycline (VIBRA-TABS) 100 MG tablet Take 1 tablet (100 mg total) by mouth every 12 (twelve) hours.  14 tablet  0  . enoxaparin (LOVENOX) 100 MG/ML injection Inject 0.9 mLs (90 mg total) into the skin every 12 (twelve) hours.  60 Syringe  1  . folic acid (FOLVITE) 1 MG tablet Take 1 tablet (1 mg total) by mouth daily.  30 tablet  1  . oxyCODONE (OXY IR/ROXICODONE) 5 MG immediate release tablet Take 1 tablet (5 mg total) by mouth every 4 (four) hours as needed for pain.  30 tablet  0  . traZODone (DESYREL) 50 MG tablet Take 1 tablet (50 mg total) by mouth at bedtime and may repeat dose one time if needed. For insomnia.  30 tablet  1   No current facility-administered medications for this visit.    ALLERGIES:  has No Known Allergies.  REVIEW OF SYSTEMS:  The rest of the 14-point review of system was negative.   Filed Vitals:   01/19/13 1341  BP: 149/90  Pulse: 75  Temp: 97.4 F (36.3 C)  Resp: 18   Wt Readings from Last 3 Encounters:  01/19/13 198 lb 11.2 oz (90.13 kg)  01/01/13 191 lb 6.4 oz (86.818 kg)  12/22/12 200 lb 4.8 oz (90.855 kg)   ECOG Performance status: 1  PHYSICAL EXAMINATION: General:  Disheveled male no acute distress.  Patient  still smells of alcohol. Eyes:  no scleral icterus.  ENT:  There were no  oropharyngeal lesions.  Neck was without thyromegaly.  Lymphatics:  Negative cervical, supraclavicular or axillary adenopathy.  Respiratory: lungs were clear bilaterally without wheezing or crackles.  Cardiovascular:  Regular rate and rhythm, S1/S2, without murmur, rub or gallop.  There was no pedal edema to the right leg. Left lower extremity with 2+ edema  with venous ulceration but no active cellulitis, increased warmth, or purulent discharge.  GI:  abdomen was soft, flat, nontender, nondistended, without organomegaly.  Muscoloskeletal:  no spinal tenderness of palpation of vertebral spine.  Skin exam was without echymosis, petichae.  Neuro exam was nonfocal.  Patient was  able to get on and off exam table without assistance.  Gait was normal.  Patient was alert and oriented.  Attention was good.   Language was appropriate.  Mood was normal without depression.     LABORATORY/RADIOLOGY DATA:  Lab Results  Component Value Date   WBC 8.2 01/19/2013   HGB 14.2 01/19/2013   HCT 41.8 01/19/2013   PLT 234 01/19/2013   GLUCOSE 82 01/19/2013   CHOL 195 03/02/2011   TRIG 147 03/02/2011   HDL 96 03/02/2011   LDLCALC 70 03/02/2011   ALKPHOS 84 01/19/2013   ALT 14 01/19/2013   AST 17 01/19/2013   NA 139 01/19/2013   K 4.1 01/19/2013   CL 109* 01/19/2013   CREATININE 0.7 01/19/2013   BUN 7.2 01/19/2013   CO2 19* 01/19/2013   INR 1.40 01/01/2013   HGBA1C 5.3 03/02/2011    ASSESSMENT AND PLAN:   1. Left lower extremity DVT.  - Patient has previously been on Coumadin, but was not compliant with lab draws been taking his medication as directed. He then had propagation of this clot while on Xarelto. He is now on Lovenox 90 mg twice a day and tolerating this well. His pain has improved and he has decreased swelling and redness to his left lower extremity. Recommend that he continue Lovenox 90 mg twice a day. 2. 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  3. Alcohol abuse. He is not interested in rehabilitation at this time or going to AA. He says that he is cutting down his beer intake on his own. 4. Mild cellulitis of his suprapubic area. This has improved with doxycycline but has not completely cleared I have given him another week of doxycycline. 5. Social issues. He is applying for disability  6. Follow-up. In about 3 months.     The length of time of the face-to-face encounter was 15 minutes. More than 50% of time was spent counseling and coordination of care.

## 2013-03-05 ENCOUNTER — Encounter (HOSPITAL_COMMUNITY): Payer: Self-pay | Admitting: Family Medicine

## 2013-03-05 ENCOUNTER — Emergency Department (HOSPITAL_COMMUNITY): Payer: Medicaid Other

## 2013-03-05 ENCOUNTER — Emergency Department (HOSPITAL_COMMUNITY)
Admission: EM | Admit: 2013-03-05 | Discharge: 2013-03-05 | Disposition: A | Discharge status: Home / Self Care | Payer: Medicaid Other | Attending: Emergency Medicine | Admitting: Emergency Medicine

## 2013-03-05 DIAGNOSIS — R51 Headache: Secondary | ICD-10-CM | POA: Insufficient documentation

## 2013-03-05 DIAGNOSIS — Z8739 Personal history of other diseases of the musculoskeletal system and connective tissue: Secondary | ICD-10-CM | POA: Insufficient documentation

## 2013-03-05 DIAGNOSIS — F17200 Nicotine dependence, unspecified, uncomplicated: Secondary | ICD-10-CM | POA: Insufficient documentation

## 2013-03-05 DIAGNOSIS — Z86718 Personal history of other venous thrombosis and embolism: Secondary | ICD-10-CM | POA: Insufficient documentation

## 2013-03-05 DIAGNOSIS — F10920 Alcohol use, unspecified with intoxication, uncomplicated: Secondary | ICD-10-CM

## 2013-03-05 DIAGNOSIS — Z8619 Personal history of other infectious and parasitic diseases: Secondary | ICD-10-CM | POA: Insufficient documentation

## 2013-03-05 DIAGNOSIS — F1010 Alcohol abuse, uncomplicated: Secondary | ICD-10-CM | POA: Insufficient documentation

## 2013-03-05 DIAGNOSIS — Z8659 Personal history of other mental and behavioral disorders: Secondary | ICD-10-CM | POA: Insufficient documentation

## 2013-03-05 DIAGNOSIS — Z7901 Long term (current) use of anticoagulants: Secondary | ICD-10-CM | POA: Insufficient documentation

## 2013-03-05 DIAGNOSIS — Z86711 Personal history of pulmonary embolism: Secondary | ICD-10-CM | POA: Insufficient documentation

## 2013-03-05 DIAGNOSIS — I2510 Atherosclerotic heart disease of native coronary artery without angina pectoris: Secondary | ICD-10-CM | POA: Insufficient documentation

## 2013-03-05 DIAGNOSIS — Z79899 Other long term (current) drug therapy: Secondary | ICD-10-CM | POA: Insufficient documentation

## 2013-03-05 DIAGNOSIS — H538 Other visual disturbances: Secondary | ICD-10-CM | POA: Insufficient documentation

## 2013-03-05 LAB — COMPREHENSIVE METABOLIC PANEL
ALT: 35 U/L (ref 0–53)
AST: 39 U/L — ABNORMAL HIGH (ref 0–37)
CO2: 22 mEq/L (ref 19–32)
Calcium: 8.9 mg/dL (ref 8.4–10.5)
Creatinine, Ser: 0.64 mg/dL (ref 0.50–1.35)
GFR calc Af Amer: >90 mL/min (ref >90)
GFR calc non Af Amer: >90 mL/min (ref >90)
Sodium: 141 mEq/L (ref 135–145)
Total Protein: 7.6 g/dL (ref 6.0–8.3)

## 2013-03-05 LAB — CBC WITH DIFFERENTIAL/PLATELET
Basophils Absolute: 0.1 10*3/uL (ref 0.0–0.1)
Eosinophils Absolute: 0.3 10*3/uL (ref 0.0–0.7)
Eosinophils Relative: 3 % (ref 0–5)
HCT: 39.9 % (ref 39.0–52.0)
Lymphocytes Relative: 39 % (ref 12–46)
MCH: 32.7 pg (ref 26.0–34.0)
MCHC: 33.6 g/dL (ref 30.0–36.0)
MCV: 97.3 fL (ref 78.0–100.0)
Monocytes Absolute: 0.6 10*3/uL (ref 0.1–1.0)
Platelets: 265 10*3/uL (ref 150–400)
RDW: 14.7 % (ref 11.5–15.5)

## 2013-03-05 MED ORDER — METOCLOPRAMIDE HCL 5 MG/ML IJ SOLN
10.0000 mg | Freq: Once | INTRAMUSCULAR | Status: AC
  Administered 2013-03-05: 10 mg via INTRAVENOUS
  Filled 2013-03-05: qty 2

## 2013-03-05 MED ORDER — AMMONIA AROMATIC IN INHA
RESPIRATORY_TRACT | Status: AC
  Administered 2013-03-05: 02:00:00
  Filled 2013-03-05: qty 10

## 2013-03-05 MED ORDER — DIPHENHYDRAMINE HCL 50 MG/ML IJ SOLN
12.5000 mg | Freq: Once | INTRAMUSCULAR | Status: AC
  Administered 2013-03-05: 12.5 mg via INTRAVENOUS
  Filled 2013-03-05: qty 1

## 2013-03-05 MED ORDER — AMMONIA AROMATIC IN INHA
RESPIRATORY_TRACT | Status: AC
  Administered 2013-03-05: 09:00:00
  Filled 2013-03-05: qty 20

## 2013-03-05 MED ORDER — SODIUM CHLORIDE 0.9 % IV BOLUS (SEPSIS)
1000.0000 mL | Freq: Once | INTRAVENOUS | Status: AC
  Administered 2013-03-05: 1000 mL via INTRAVENOUS

## 2013-03-05 NOTE — ED Notes (Signed)
In error, another pt received pt breakfast tray. Dietary has been called to send pt tray up so he can eat and be d/c

## 2013-03-05 NOTE — ED Provider Notes (Signed)
Medical screening examination/treatment/procedure(s) were performed by non-physician practitioner and as supervising physician I was immediately available for consultation/collaboration.  Lyanne Co, MD 03/05/13 0600

## 2013-03-05 NOTE — ED Provider Notes (Signed)
Medical screening examination/treatment/procedure(s) were performed by non-physician practitioner and as supervising physician I was immediately available for consultation/collaboration. Alicea Wente, MD, FACEP   Lauralie Blacksher L Pearlean Sabina, MD 03/05/13 1608 

## 2013-03-05 NOTE — ED Provider Notes (Addendum)
History    CSN: [id number] Arrival date & time 03/05/13  0059  First MD Initiated Contact with Patient 03/05/13 0210     Chief Complaint  Patient presents with  . Medical Clearance   (Consider location/radiation/quality/duration/timing/severity/associated sxs/prior Treatment) Patient is a 47 y.o. male presenting with headaches. The history is provided by the patient. No language interpreter was used.  Headache Pain location:  L temporal Quality:  Sharp Radiates to:  Does not radiate Severity currently:  10/10 Onset quality:  Gradual Associated symptoms: no abdominal pain, no fever, no myalgias, no neck pain and no vomiting   Associated symptoms comment:  Headache in left temple that is sharp and severe. He reports blurred vision bilaterally. No nausea or vomiting. He has had migraine headaches in the past but this headache is not like previous pain. No recent fever or injury.   Past Medical History  Diagnosis Date  . Coronary artery disease   . Degenerative joint disease of spine   . Pulmonary embolism 06/2010  . Degenerative joint disease   . 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 ETOH abuse   . DVT (deep venous thrombosis)   . Hepatitis B   . Mental disorder   . Depression    Past Surgical History  Procedure Laterality Date  . Left elbow surgery    . Tonsillectomy     No family history on file. History  Substance Use Topics  . Smoking status: Current Every Day Smoker -- 2.00 packs/day for 30 years    Types: Cigarettes  . Smokeless tobacco: Never Used  . Alcohol Use: Yes     Comment: heavy drinker daily.  six pack of beer daily    Review of Systems  Constitutional: Negative for fever.  HENT: Negative for neck pain.   Eyes: Positive for visual disturbance.  Respiratory: Negative for shortness of breath.   Cardiovascular: Negative for chest pain.  Gastrointestinal: Negative for vomiting and abdominal pain.  Musculoskeletal: Negative for myalgias.  Skin:  Negative for rash.  Neurological: Positive for headaches.  Psychiatric/Behavioral: Negative for confusion.    Allergies  Review of patient's allergies indicates no known allergies.  Home Medications   Current Outpatient Rx  Name  Route  Sig  Dispense  Refill  . enoxaparin (LOVENOX) 100 MG/ML injection   Subcutaneous   Inject 0.9 mLs (90 mg total) into the skin every 12 (twelve) hours.   60 Syringe   1   . folic acid (FOLVITE) 1 MG tablet   Oral   Take 1 tablet (1 mg total) by mouth daily.   30 tablet   1   . oxyCODONE (OXY IR/ROXICODONE) 5 MG immediate release tablet   Oral   Take 1 tablet (5 mg total) by mouth every 4 (four) hours as needed for pain.   30 tablet   0   . traZODone (DESYREL) 50 MG tablet   Oral   Take 1 tablet (50 mg total) by mouth at bedtime and may repeat dose one time if needed. For insomnia.   30 tablet   1    BP 149/88  Pulse 90  Temp(Src) 97.8 F (36.6 C) (Oral)  Resp 22  SpO2 97% Physical Exam  Constitutional: He is oriented to person, place, and time. He appears well-developed and well-nourished.  HENT:  Head: Normocephalic and atraumatic.  Eyes: EOM are normal. Pupils are equal, round, and reactive to light.  Neck: Normal range of motion.  Cardiovascular: Normal rate and regular rhythm.  No murmur heard. Pulmonary/Chest: Effort normal and breath sounds normal. He has no wheezes. He has no rales.  Abdominal: Soft. There is no tenderness.  Neurological: He is alert and oriented to person, place, and time. He has normal strength and normal reflexes. No sensory deficit. He displays a negative Romberg sign. Coordination normal.  Skin: Skin is warm and dry.  Psychiatric:  He is acutely intoxicated, agitated, minimally cooperative.     ED Course  Procedures (including critical care time) Labs Reviewed  CBC WITH DIFFERENTIAL - Abnormal; Notable for the following:    RBC 4.10 (*)    All other components within normal limits  ETHANOL   COMPREHENSIVE METABOLIC PANEL   Results for orders placed during the hospital encounter of 03/05/13  ETHANOL      Result Value Range   Alcohol, Ethyl (B) 274 (*) 0 - 11 mg/dL  CBC WITH DIFFERENTIAL      Result Value Range   WBC 7.9  4.0 - 10.5 K/uL   RBC 4.10 (*) 4.22 - 5.81 MIL/uL   Hemoglobin 13.4  13.0 - 17.0 g/dL   HCT 69.6  29.5 - 28.4 %   MCV 97.3  78.0 - 100.0 fL   MCH 32.7  26.0 - 34.0 pg   MCHC 33.6  30.0 - 36.0 g/dL   RDW 13.2  44.0 - 10.2 %   Platelets 265  150 - 400 K/uL   Neutrophils Relative % 49  43 - 77 %   Neutro Abs 3.9  1.7 - 7.7 K/uL   Lymphocytes Relative 39  12 - 46 %   Lymphs Abs 3.1  0.7 - 4.0 K/uL   Monocytes Relative 8  3 - 12 %   Monocytes Absolute 0.6  0.1 - 1.0 K/uL   Eosinophils Relative 3  0 - 5 %   Eosinophils Absolute 0.3  0.0 - 0.7 K/uL   Basophils Relative 1  0 - 1 %   Basophils Absolute 0.1  0.0 - 0.1 K/uL  COMPREHENSIVE METABOLIC PANEL      Result Value Range   Sodium 141  135 - 145 mEq/L   Potassium 3.7  3.5 - 5.1 mEq/L   Chloride 105  96 - 112 mEq/L   CO2 22  19 - 32 mEq/L   Glucose, Bld 86  70 - 99 mg/dL   BUN 6  6 - 23 mg/dL   Creatinine, Ser 7.25  0.50 - 1.35 mg/dL   Calcium 8.9  8.4 - 36.6 mg/dL   Total Protein 7.6  6.0 - 8.3 g/dL   Albumin 3.8  3.5 - 5.2 g/dL   AST 39 (*) 0 - 37 U/L   ALT 35  0 - 53 U/L   Alkaline Phosphatase 74  39 - 117 U/L   Total Bilirubin 0.1 (*) 0.3 - 1.2 mg/dL   GFR calc non Af Amer >90  >90 mL/min   GFR calc Af Amer >90  >90 mL/min   Ct Head Wo Contrast  03/05/2013   *RADIOLOGY REPORT*  Clinical Data: The patient complains of "aneurysm".  Mental status changes.  CT HEAD WITHOUT CONTRAST  Technique:  Contiguous axial images were obtained from the base of the skull through the vertex without contrast.  Comparison: 08/04/2012  Findings: Bone windows demonstrate mucous retention cyst or polyp in the left sphenoid sinus.  Minimal motion degradation. Clear mastoid air cells.  Soft tissue windows  demonstrate mildly advanced for age cerebral atrophy.  Mega cisterna magna. No  mass lesion, hemorrhage, hydrocephalus, acute infarct, intra-axial, or extra-axial fluid collection.  IMPRESSION:  1. No acute intracranial abnormality. 2.  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   Original Report Authenticated By: Jeronimo Greaves, M.D.   No results found. No diagnosis found. 1. Headache 2. Alcohol intoxication   MDM  Patient has been belligerent to staff, lying on the floor, uncooperative with historical questions. Patient appears intoxicated. Medications given, CT ordered, labs pending.   Arnoldo Hooker, PA-C 03/05/13 0559  Arnoldo Hooker, PA-C 04/06/13 1439

## 2013-03-05 NOTE — ED Notes (Signed)
[employer] at bedside, pt refusing to get out of floor. Pt yelling and cursing at security and staff

## 2013-03-05 NOTE — ED Notes (Signed)
Pt received breakfast tray, he is eating at this time

## 2013-03-05 NOTE — ED Notes (Signed)
Patient is resting comfortably. 

## 2013-03-05 NOTE — ED Provider Notes (Signed)
8:52 AM Handoff from [employer]. Patient is doing well, alert and oriented. He has been up in the hall and has eaten breakfast without difficulty. Feel stable for discharge.   Renne Crigler, PA-C 03/05/13 [phone]

## 2013-03-05 NOTE — ED Notes (Signed)
Patient laying on the floor, not responding verbally. Ammonia inhalant used to arouse patient. Patient states "I may be having a freakin aneurysm and y'all want to ask me all these questions." Patient yelling and not answering questions.

## 2013-03-22 ENCOUNTER — Emergency Department (HOSPITAL_COMMUNITY)
Admission: EM | Admit: 2013-03-22 | Discharge: 2013-03-22 | Disposition: A | Discharge status: Home / Self Care | Payer: Medicaid Other | Attending: Emergency Medicine | Admitting: Emergency Medicine

## 2013-03-22 ENCOUNTER — Encounter (HOSPITAL_COMMUNITY): Payer: Self-pay | Admitting: *Deleted

## 2013-03-22 ENCOUNTER — Emergency Department (HOSPITAL_COMMUNITY): Payer: Medicaid Other

## 2013-03-22 DIAGNOSIS — F1010 Alcohol abuse, uncomplicated: Secondary | ICD-10-CM | POA: Insufficient documentation

## 2013-03-22 DIAGNOSIS — F329 Major depressive disorder, single episode, unspecified: Secondary | ICD-10-CM | POA: Insufficient documentation

## 2013-03-22 DIAGNOSIS — I82409 Acute embolism and thrombosis of unspecified deep veins of unspecified lower extremity: Secondary | ICD-10-CM | POA: Insufficient documentation

## 2013-03-22 DIAGNOSIS — Z8739 Personal history of other diseases of the musculoskeletal system and connective tissue: Secondary | ICD-10-CM | POA: Insufficient documentation

## 2013-03-22 DIAGNOSIS — R059 Cough, unspecified: Secondary | ICD-10-CM | POA: Insufficient documentation

## 2013-03-22 DIAGNOSIS — I2510 Atherosclerotic heart disease of native coronary artery without angina pectoris: Secondary | ICD-10-CM | POA: Insufficient documentation

## 2013-03-22 DIAGNOSIS — Z7901 Long term (current) use of anticoagulants: Secondary | ICD-10-CM | POA: Insufficient documentation

## 2013-03-22 DIAGNOSIS — Z79899 Other long term (current) drug therapy: Secondary | ICD-10-CM | POA: Insufficient documentation

## 2013-03-22 DIAGNOSIS — Z8659 Personal history of other mental and behavioral disorders: Secondary | ICD-10-CM | POA: Insufficient documentation

## 2013-03-22 DIAGNOSIS — Z8619 Personal history of other infectious and parasitic diseases: Secondary | ICD-10-CM | POA: Insufficient documentation

## 2013-03-22 DIAGNOSIS — R05 Cough: Secondary | ICD-10-CM | POA: Insufficient documentation

## 2013-03-22 DIAGNOSIS — M7989 Other specified soft tissue disorders: Secondary | ICD-10-CM | POA: Insufficient documentation

## 2013-03-22 DIAGNOSIS — Z862 Personal history of diseases of the blood and blood-forming organs and certain disorders involving the immune mechanism: Secondary | ICD-10-CM | POA: Insufficient documentation

## 2013-03-22 DIAGNOSIS — F17200 Nicotine dependence, unspecified, uncomplicated: Secondary | ICD-10-CM | POA: Insufficient documentation

## 2013-03-22 DIAGNOSIS — F3289 Other specified depressive episodes: Secondary | ICD-10-CM | POA: Insufficient documentation

## 2013-03-22 MED ORDER — OXYCODONE-ACETAMINOPHEN 5-325 MG PO TABS
2.0000 | ORAL_TABLET | Freq: Once | ORAL | Status: AC
  Administered 2013-03-22: 2 via ORAL
  Filled 2013-03-22: qty 2

## 2013-03-22 MED ORDER — OXYCODONE-ACETAMINOPHEN 5-325 MG PO TABS
2.0000 | ORAL_TABLET | Freq: Four times a day (QID) | ORAL | Status: DC | PRN
Start: 2013-03-22 — End: 2013-04-03

## 2013-03-22 NOTE — ED Notes (Signed)
Pt has left leg pain and swelling HX of DVT and PE. Has clotting disorder. Reports taking Lovenox. Pt has not been taking medication for blood clots x 5 days. Swelling and pain started yesterday. Difficulty ambulating due to pain. Denies CP or SOB. BP 100/61, HR 72, CBG 140. Pulse present to foot.

## 2013-03-22 NOTE — ED Notes (Signed)
Order placed at 1853 and secretary called vascular, they said it was too late for study to be done today and to set pt up for outpatient study.

## 2013-03-22 NOTE — Progress Notes (Signed)
Order placed for left lower extremity venous duplex at 1854. Spoke with Diplomatic Services operational officer, she asked if study could be done tonight or if it was too late and it would have to be done tomorrow. I explained that although it is close to closing time the study can be completed tonight if the patient is placed in a room, or the patient can return in the morning as an outpatient. Secretary said she would talk to the nurse.  03/22/2013 7:06 PM Gertie Fey, RVT, RDCS, RDMS

## 2013-03-22 NOTE — ED Provider Notes (Signed)
History    CSN: [id number] Arrival date & time 03/22/13  4098  First MD Initiated Contact with Patient 03/22/13 2038     Chief Complaint  Patient presents with  . Leg Pain  . 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  Pt reports that he was in jail for the past five days and did not have access to his lovenox.  He now reports worsening pain and swelling in his LLE.  It is not radiating.  He denies chest pain.  He does endorse productive cough of yellow sputum for several days, but denies DOE or other sx of PE.  Walking makes his leg pain worse.  Nothing makes it better.  He denies any fevers or chills.  Past Medical History  Diagnosis Date  . Coronary artery disease   . Degenerative joint disease of spine   . Pulmonary embolism 06/2010  . Degenerative joint disease   . 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 ETOH abuse   . DVT (deep venous thrombosis)   . Hepatitis B   . Mental disorder   . Depression    Past Surgical History  Procedure Laterality Date  . Left elbow surgery    . Tonsillectomy     History reviewed. No pertinent family history. History  Substance Use Topics  . Smoking status: Current Every Day Smoker -- 2.00 packs/day for 30 years    Types: Cigarettes  . Smokeless tobacco: Never Used  . Alcohol Use: Yes     Comment: heavy drinker daily.  six pack of beer daily    Review of Systems  Constitutional: Negative for fever, chills, diaphoresis and fatigue.  HENT: Negative.   Respiratory: Positive for cough. Negative for chest tightness, shortness of breath, wheezing and stridor.   Cardiovascular: Positive for  leg swelling. Negative for chest pain and palpitations.  Gastrointestinal: Negative.   Endocrine: Negative.   Genitourinary: Negative.   Skin: Negative.   Neurological: Negative.     Allergies  Review of patient's allergies indicates no known allergies.  Home Medications   Current Outpatient Rx  Name  Route  Sig  Dispense  Refill  . enoxaparin (LOVENOX) 100 MG/ML injection   Subcutaneous   Inject 0.9 mLs (90 mg total) into the skin every 12 (twelve) hours.   60 Syringe   1   . traZODone (DESYREL) 50 MG tablet   Oral   Take 50 mg by mouth at bedtime as needed for sleep.          BP 97/64  Pulse 95  Temp(Src) 99.1 F (37.3 C) (Oral)  Resp 18  Ht 6\' 2"  (1.88 m)  Wt 190 lb (86.183 kg)  BMI 24.38 kg/m2  SpO2 98% Physical Exam  Nursing note and vitals reviewed. Constitutional: He is oriented to person, place, and time. No distress.  HENT:  Head: Normocephalic and atraumatic.  Eyes: EOM are normal. Pupils are equal, round, and reactive to light.  Neck: Normal range of motion. Neck supple.  Cardiovascular: Normal rate, regular rhythm, normal heart sounds and intact distal pulses.   Pulmonary/Chest: Effort normal and breath sounds normal.  Abdominal: Soft. Bowel sounds are normal.  Musculoskeletal:  Pt has LLE with pitting edema 3+ and  calf TTP.  Good peripheral pulses.  Foot is warm and well-perfused.  Neurological: He is alert and oriented to person, place, and time.  Skin: Skin is warm and dry. He is not diaphoretic.  Psychiatric: He has a normal mood and affect.    ED Course  Procedures (including critical care time) Labs Reviewed - No data to display No results found. No diagnosis found.  CHEST - 2 VIEW  Comparison: Chest radiograph and CTA of the chest performed 12/11/2012  Findings: The lungs are well-aerated. Mild chronic peribronchial thickening is seen. There is no evidence of focal opacification, pleural effusion or pneumothorax. A stable calcified  granuloma is noted at the left midlung zone.  The heart is normal in size; the mediastinal contour is within normal limits. No acute osseous abnormalities are seen.  IMPRESSION: Mild chronic peribronchial thickening seen; no acute cardiopulmonary process identified.   MDM  Pt with hx of lupus anticoagulant disorder and hx of DVTs/PEs now on lovenox and followed by heme.  He states he's missed about five days of his lovenox d/t being in jail.  He took a dose at about 1400hrs today; however, he came to the ED for c/o worsening swelling and pain in LLE.  He denies sx c/w PE.  He does have productive cough more c/f possible PNA, so will check CXR.  O/w will rec pt to continue lovenox and have f/u LE [place] in one week; close f/u with heme and strict return precautions.  Ashby Dawes, MD 03/22/13 [phone]

## 2013-03-26 ENCOUNTER — Inpatient Hospital Stay (HOSPITAL_COMMUNITY): Payer: Medicaid Other

## 2013-03-26 ENCOUNTER — Inpatient Hospital Stay (HOSPITAL_COMMUNITY)
Admission: EM | Admit: 2013-03-26 | Discharge: 2013-04-06 | DRG: 238 | Disposition: A | Discharge status: Skilled Nursing Facility | Payer: Medicaid Other | Attending: Internal Medicine | Admitting: Internal Medicine

## 2013-03-26 ENCOUNTER — Encounter (HOSPITAL_COMMUNITY): Payer: Self-pay

## 2013-03-26 DIAGNOSIS — L03116 Cellulitis of left lower limb: Secondary | ICD-10-CM

## 2013-03-26 DIAGNOSIS — B1910 Unspecified viral hepatitis B without hepatic coma: Secondary | ICD-10-CM | POA: Diagnosis present

## 2013-03-26 DIAGNOSIS — F329 Major depressive disorder, single episode, unspecified: Secondary | ICD-10-CM | POA: Diagnosis present

## 2013-03-26 DIAGNOSIS — D6862 Lupus anticoagulant syndrome: Secondary | ICD-10-CM

## 2013-03-26 DIAGNOSIS — F1010 Alcohol abuse, uncomplicated: Secondary | ICD-10-CM

## 2013-03-26 DIAGNOSIS — D649 Anemia, unspecified: Secondary | ICD-10-CM

## 2013-03-26 DIAGNOSIS — Z9119 Patient's noncompliance with other medical treatment and regimen: Secondary | ICD-10-CM

## 2013-03-26 DIAGNOSIS — I80202 Phlebitis and thrombophlebitis of unspecified deep vessels of left lower extremity: Secondary | ICD-10-CM

## 2013-03-26 DIAGNOSIS — E871 Hypo-osmolality and hyponatremia: Secondary | ICD-10-CM | POA: Diagnosis present

## 2013-03-26 DIAGNOSIS — M79609 Pain in unspecified limb: Secondary | ICD-10-CM

## 2013-03-26 DIAGNOSIS — L02419 Cutaneous abscess of limb, unspecified: Secondary | ICD-10-CM | POA: Diagnosis present

## 2013-03-26 DIAGNOSIS — M7989 Other specified soft tissue disorders: Secondary | ICD-10-CM

## 2013-03-26 DIAGNOSIS — Z720 Tobacco use: Secondary | ICD-10-CM

## 2013-03-26 DIAGNOSIS — D6859 Other primary thrombophilia: Secondary | ICD-10-CM | POA: Diagnosis present

## 2013-03-26 DIAGNOSIS — F3289 Other specified depressive episodes: Secondary | ICD-10-CM | POA: Diagnosis present

## 2013-03-26 DIAGNOSIS — I82409 Acute embolism and thrombosis of unspecified deep veins of unspecified lower extremity: Secondary | ICD-10-CM

## 2013-03-26 DIAGNOSIS — Z5900 Homelessness unspecified: Secondary | ICD-10-CM

## 2013-03-26 DIAGNOSIS — F17200 Nicotine dependence, unspecified, uncomplicated: Secondary | ICD-10-CM | POA: Diagnosis present

## 2013-03-26 DIAGNOSIS — I2510 Atherosclerotic heart disease of native coronary artery without angina pectoris: Secondary | ICD-10-CM | POA: Diagnosis present

## 2013-03-26 DIAGNOSIS — I82402 Acute embolism and thrombosis of unspecified deep veins of left lower extremity: Secondary | ICD-10-CM

## 2013-03-26 DIAGNOSIS — F1020 Alcohol dependence, uncomplicated: Secondary | ICD-10-CM | POA: Diagnosis present

## 2013-03-26 DIAGNOSIS — I80299 Phlebitis and thrombophlebitis of other deep vessels of unspecified lower extremity: Principal | ICD-10-CM | POA: Diagnosis present

## 2013-03-26 DIAGNOSIS — M479 Spondylosis, unspecified: Secondary | ICD-10-CM | POA: Diagnosis present

## 2013-03-26 DIAGNOSIS — Z7901 Long term (current) use of anticoagulants: Secondary | ICD-10-CM

## 2013-03-26 DIAGNOSIS — Z86711 Personal history of pulmonary embolism: Secondary | ICD-10-CM

## 2013-03-26 DIAGNOSIS — Z91199 Patient's noncompliance with other medical treatment and regimen due to unspecified reason: Secondary | ICD-10-CM

## 2013-03-26 DIAGNOSIS — Z86718 Personal history of other venous thrombosis and embolism: Secondary | ICD-10-CM

## 2013-03-26 DIAGNOSIS — I80209 Phlebitis and thrombophlebitis of unspecified deep vessels of unspecified lower extremity: Secondary | ICD-10-CM | POA: Diagnosis present

## 2013-03-26 LAB — CBC WITH DIFFERENTIAL/PLATELET
HCT: 35.3 % — ABNORMAL LOW (ref 39.0–52.0)
Hemoglobin: 12 g/dL — ABNORMAL LOW (ref 13.0–17.0)
Lymphs Abs: 1.8 10*3/uL (ref 0.7–4.0)
MCH: 33.1 pg (ref 26.0–34.0)
Monocytes Absolute: 0.9 10*3/uL (ref 0.1–1.0)
Monocytes Relative: 9 % (ref 3–12)
Neutro Abs: 6.8 10*3/uL (ref 1.7–7.7)
Neutrophils Relative %: 69 % (ref 43–77)
RBC: 3.63 MIL/uL — ABNORMAL LOW (ref 4.22–5.81)

## 2013-03-26 LAB — HEPATIC FUNCTION PANEL
ALT: 16 U/L (ref 0–53)
Alkaline Phosphatase: 68 U/L (ref 39–117)
Total Bilirubin: 0.2 mg/dL — ABNORMAL LOW (ref 0.3–1.2)
Total Protein: 7.5 g/dL (ref 6.0–8.3)

## 2013-03-26 LAB — BASIC METABOLIC PANEL
BUN: 5 mg/dL — ABNORMAL LOW (ref 6–23)
Chloride: 95 mEq/L — ABNORMAL LOW (ref 96–112)
Creatinine, Ser: 0.63 mg/dL (ref 0.50–1.35)
Glucose, Bld: 86 mg/dL (ref 70–99)
Potassium: 3.9 mEq/L (ref 3.5–5.1)

## 2013-03-26 LAB — APTT: aPTT: 37 seconds (ref 24–37)

## 2013-03-26 LAB — FIBRINOGEN: Fibrinogen: 707 mg/dL — ABNORMAL HIGH (ref 204–475)

## 2013-03-26 LAB — PROTIME-INR
INR: 0.94 (ref 0.00–1.49)
Prothrombin Time: 12.4 seconds (ref 11.6–15.2)

## 2013-03-26 MED ORDER — ALTEPLASE 100 MG IV SOLR
2.0000 mg | Freq: Once | INTRAVENOUS | Status: DC
  Filled 2013-03-26: qty 2

## 2013-03-26 MED ORDER — IOHEXOL 300 MG/ML  SOLN
150.0000 mL | Freq: Once | INTRAMUSCULAR | Status: AC | PRN
  Administered 2013-03-26: 40 mL via INTRAVENOUS

## 2013-03-26 MED ORDER — ADULT MULTIVITAMIN W/MINERALS CH
1.0000 | ORAL_TABLET | Freq: Every day | ORAL | Status: DC
  Administered 2013-03-27 – 2013-04-06 (×11): 1 via ORAL
  Filled 2013-03-26 (×11): qty 1

## 2013-03-26 MED ORDER — ACETAMINOPHEN 325 MG PO TABS
650.0000 mg | ORAL_TABLET | Freq: Four times a day (QID) | ORAL | Status: DC | PRN
  Administered 2013-03-28 – 2013-04-01 (×2): 650 mg via ORAL
  Filled 2013-03-26 (×2): qty 2

## 2013-03-26 MED ORDER — LORAZEPAM 2 MG/ML IJ SOLN
1.0000 mg | Freq: Four times a day (QID) | INTRAMUSCULAR | Status: AC | PRN
  Filled 2013-03-26: qty 1

## 2013-03-26 MED ORDER — ACETAMINOPHEN 650 MG RE SUPP: 650.0000 mg | Freq: Four times a day (QID) | RECTAL | Status: DC | PRN

## 2013-03-26 MED ORDER — SODIUM CHLORIDE 0.9 % IV SOLN
Freq: Once | INTRAVENOUS | Status: AC
  Administered 2013-03-26: 17:00:00 via INTRAVENOUS

## 2013-03-26 MED ORDER — MIDAZOLAM HCL 2 MG/2ML IJ SOLN
INTRAMUSCULAR | Status: AC | PRN
  Administered 2013-03-26 (×3): 1 mg via INTRAVENOUS
  Administered 2013-03-26: 2 mg via INTRAVENOUS
  Administered 2013-03-26: 1 mg via INTRAVENOUS

## 2013-03-26 MED ORDER — LORAZEPAM 1 MG PO TABS
1.0000 mg | ORAL_TABLET | Freq: Four times a day (QID) | ORAL | Status: AC | PRN
  Administered 2013-03-27 (×2): 1 mg via ORAL
  Filled 2013-03-26 (×2): qty 1

## 2013-03-26 MED ORDER — OXYCODONE HCL 5 MG PO TABS
5.0000 mg | ORAL_TABLET | ORAL | Status: DC | PRN
  Administered 2013-03-27 – 2013-04-01 (×18): 5 mg via ORAL
  Filled 2013-03-26 (×18): qty 1

## 2013-03-26 MED ORDER — HEPARIN (PORCINE) IN NACL 100-0.45 UNIT/ML-% IJ SOLN: 12.0000 [IU]/kg/h | INTRAMUSCULAR | Status: DC

## 2013-03-26 MED ORDER — THIAMINE HCL 100 MG/ML IJ SOLN
Freq: Once | INTRAVENOUS | Status: AC
  Administered 2013-03-26: 21:00:00 via INTRAVENOUS
  Filled 2013-03-26: qty 1000

## 2013-03-26 MED ORDER — LORAZEPAM 2 MG/ML IJ SOLN
0.0000 mg | Freq: Four times a day (QID) | INTRAMUSCULAR | Status: AC
  Administered 2013-03-27: 1 mg via INTRAVENOUS
  Administered 2013-03-28 (×2): 2 mg via INTRAVENOUS
  Filled 2013-03-26 (×2): qty 1

## 2013-03-26 MED ORDER — HEPARIN SODIUM (PORCINE) 5000 UNIT/ML IJ SOLN: 5000.0000 [IU] | Freq: Once | INTRAMUSCULAR | Status: DC

## 2013-03-26 MED ORDER — THIAMINE HCL 100 MG/ML IJ SOLN
100.0000 mg | Freq: Every day | INTRAMUSCULAR | Status: DC
  Filled 2013-03-26 (×3): qty 1

## 2013-03-26 MED ORDER — ALTEPLASE 100 MG IV SOLR
INTRAVENOUS | Status: AC | PRN
  Administered 2013-03-26 (×2): 2 mg

## 2013-03-26 MED ORDER — SODIUM CHLORIDE 0.9 % IJ SOLN
3.0000 mL | Freq: Two times a day (BID) | INTRAMUSCULAR | Status: DC
Start: 2013-03-26 — End: 2013-04-06
  Administered 2013-03-26 – 2013-03-28 (×2): 3 mL via INTRAVENOUS
  Administered 2013-03-28: via INTRAVENOUS
  Administered 2013-03-28 – 2013-04-04 (×13): 3 mL via INTRAVENOUS

## 2013-03-26 MED ORDER — LORAZEPAM 2 MG/ML IJ SOLN
0.0000 mg | Freq: Two times a day (BID) | INTRAMUSCULAR | Status: AC
  Administered 2013-03-30: 2 mg via INTRAVENOUS
  Filled 2013-03-26: qty 1

## 2013-03-26 MED ORDER — HYDROCODONE-ACETAMINOPHEN 5-325 MG PO TABS
2.0000 | ORAL_TABLET | Freq: Once | ORAL | Status: AC
  Administered 2013-03-26: 2 via ORAL
  Filled 2013-03-26: qty 2

## 2013-03-26 MED ORDER — FENTANYL CITRATE 0.05 MG/ML IJ SOLN
INTRAMUSCULAR | Status: AC | PRN
  Administered 2013-03-26 (×4): 50 ug via INTRAVENOUS

## 2013-03-26 MED ORDER — ONDANSETRON HCL 4 MG/2ML IJ SOLN: 4.0000 mg | Freq: Four times a day (QID) | INTRAMUSCULAR | Status: DC | PRN

## 2013-03-26 MED ORDER — HEPARIN (PORCINE) IN NACL 100-0.45 UNIT/ML-% IJ SOLN
1600.0000 [IU]/h | INTRAMUSCULAR | Status: DC
  Administered 2013-03-26: 1600 [IU]/h via INTRAVENOUS
  Filled 2013-03-26 (×3): qty 250

## 2013-03-26 MED ORDER — FOLIC ACID 1 MG PO TABS
1.0000 mg | ORAL_TABLET | Freq: Every day | ORAL | Status: DC
  Administered 2013-03-27 – 2013-04-06 (×11): 1 mg via ORAL
  Filled 2013-03-26 (×11): qty 1

## 2013-03-26 MED ORDER — TENECTEPLASE 50 MG IV KIT
0.5000 mg/h | PACK | INTRAVENOUS | Status: DC
  Administered 2013-03-27: 0.5 mg/h
  Filled 2013-03-26 (×3): qty 0.5

## 2013-03-26 MED ORDER — ONDANSETRON HCL 4 MG PO TABS: 4.0000 mg | ORAL_TABLET | Freq: Four times a day (QID) | ORAL | Status: DC | PRN

## 2013-03-26 MED ORDER — ALUM & MAG HYDROXIDE-SIMETH 200-200-20 MG/5ML PO SUSP: 30.0000 mL | Freq: Four times a day (QID) | ORAL | Status: DC | PRN

## 2013-03-26 MED ORDER — VITAMIN B-1 100 MG PO TABS
100.0000 mg | ORAL_TABLET | Freq: Every day | ORAL | Status: DC
  Administered 2013-03-27 – 2013-04-06 (×11): 100 mg via ORAL
  Filled 2013-03-26 (×11): qty 1

## 2013-03-26 MED ORDER — HYDROMORPHONE HCL PF 1 MG/ML IJ SOLN
1.0000 mg | INTRAMUSCULAR | Status: DC | PRN
  Administered 2013-03-27 – 2013-03-29 (×6): 1 mg via INTRAVENOUS
  Filled 2013-03-26 (×8): qty 1

## 2013-03-26 MED ORDER — HEPARIN BOLUS VIA INFUSION
5000.0000 [IU] | Freq: Once | INTRAVENOUS | Status: AC
  Administered 2013-03-26: 5000 [IU] via INTRAVENOUS

## 2013-03-26 MED ORDER — NICOTINE 21 MG/24HR TD PT24
21.0000 mg | MEDICATED_PATCH | Freq: Every day | TRANSDERMAL | Status: DC
  Administered 2013-03-26 – 2013-04-06 (×12): 21 mg via TRANSDERMAL
  Filled 2013-03-26 (×12): qty 1

## 2013-03-26 MED ORDER — SODIUM CHLORIDE 0.9 % IV BOLUS (SEPSIS): 1000.0000 mL | Freq: Once | INTRAVENOUS | Status: DC

## 2013-03-26 NOTE — ED Provider Notes (Signed)
Medical screening examination/treatment/procedure(s) were conducted as a shared visit with non-physician practitioner(s) and myself.  I personally evaluated the patient during the encounter    Nelia Shi, MD 03/26/13 2109

## 2013-03-26 NOTE — Progress Notes (Signed)
[facility] hospitalist progress note. Chief complaint. Transfer note. History of present illness. 47 year old male presented to [facility] long emergency room with redness and swelling of the left lower extremity. Does have a prior history of DVT. He does have a history of IVC filter placement. Patient transferred to [facility] for initiation of thrombolysis/thrombectomy procedure. Antibiotics initiated for possible superimposed cellulitis. Patient on IV heparin her interventional radiology recommendation. I can see the patient at bedside to ensure he remained stable post transfer and his orders transferred appropriately. Vital signs. Temperature 99.6, pulse 13, respiration 15, blood pressure 119/52. O2 sats 95%. General appearance. Well-developed middle-aged male who is alert, cooperative, in no distress. Cardiac. Rhythm regular. Lungs. Breath sounds clear and equal. Abdomen. Soft with positive bowel sounds. No pain. Extremities. Marked edema involving the entire left lower extremity. Mid tibial area of erythema suggestive possible cellulitis. Impression/plan. 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 Patient appears stable per evaluation now in step down unit. For emergent interventional radiology consult for consideration of clot lysis. Problem #2. Alcohol abuse. Detoxification pursue a protocol. Problem #3. Tobacco abuse. Cessation and nicotine patch. Problem #4. Lupus anticoagulant disorder. I needs lifelong anticoagulation. Patient appears clinically stable post transfer to  [facility]. All orders appear to of transferred appropriately.

## 2013-03-26 NOTE — Progress Notes (Signed)
VASCULAR LAB PRELIMINARY  PRELIMINARY  PRELIMINARY  PRELIMINARY  Left lower extremity venous Doppler completed.    Preliminary report:  There is extensive, acute, occlusive DVT noted throughout the left lower extremity beginning in the posterior tibial vein and coursing through to include the popliteal, femoral and common femoral veins.  There is superficial thrombosis noted in the greater saphenous vein from the origin to mid thigh.  Marks Scalera, RVT 03/26/2013, 4:43 PM

## 2013-03-26 NOTE — ED Notes (Signed)
Pt presents via EMS with c/o leg swelling. Pt says his leg started swelling three days ago and patient went to [facility] for treatment. D/c with oxycodone but patient says he is now out of that. Per EMS, pt's leg is doubled in size compared to the right leg and pt told EMS that the size has doubled in the past 24 hours.

## 2013-03-26 NOTE — ED Notes (Signed)
[facility] called for transport. 

## 2013-03-26 NOTE — ED Notes (Signed)
Total of 4mg  TPA intra-arterial

## 2013-03-26 NOTE — Procedures (Signed)
Interventional Radiology Procedure Note  Procedure:  1. ) LLE venogram and cavogram 2.) Reflux of TPA into proximal calf veins 3.) Initiation of venous lysis at 0.5 mg TNK/hr Complications: None Recommendations: - TNK at 0.5 mg/hr - Heparin per pharmacy - Fibrinogen, CBC Q6 hrs - Recheck tomorrow after 12 noon  Signed,  Sterling Big, MD Vascular & Interventional Radiologist [facility] Radiology

## 2013-03-26 NOTE — ED Provider Notes (Addendum)
History    CSN: [id number] Arrival date & time 03/26/13  1433  First MD Initiated Contact with Patient 03/26/13 1459     Chief Complaint  Patient presents with  . Leg Swelling   (Consider location/radiation/quality/duration/timing/severity/associated sxs/prior Treatment) HPI Comments: Patient is a 47 year old male with a history of Lupus anticoagulant d/o, DVT, PE's and IVC filter who presents for redness and swelling of his left leg. Patient states that symptoms have been persisting x3 days but have worsened significantly over the last 24 hours. States he takes Lovenox 90mg  BID daily for clots and has been compliant with this medication since he was last seen at [facility]. States was noncompliant prior to 03/22/13 secondary to incarceration. Patient admits to a throbbing pain sensation in his distal L extremity and ankle that is constant. Patient denies any modifying factors of his symptoms. Denies fevers, lightheadedness, dizziness, syncope, CP, SOB, numbness/tingling, and extremity weakness.  The history is provided by the patient. No language interpreter was used.   Past Medical History  Diagnosis Date  . Coronary artery disease   . Degenerative joint disease of spine   . Pulmonary embolism 06/2010  . Degenerative joint disease   . 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 ETOH abuse   . DVT (deep venous thrombosis)   . Hepatitis B   . Mental disorder   . Depression    Past Surgical History  Procedure Laterality Date  . Left elbow surgery    . Tonsillectomy     No family history on file. History  Substance Use Topics  . Smoking status: Current Every Day Smoker -- 2.00 packs/day for 30 years    Types: Cigarettes  . Smokeless tobacco: Never Used  . Alcohol Use: Yes     Comment: heavy drinker daily.  six pack of beer daily    Review of Systems  Constitutional: Negative for fever.  Respiratory: Negative for shortness of breath.   Cardiovascular: Positive for leg swelling.   Gastrointestinal: Negative for nausea and vomiting.  Musculoskeletal: Positive for joint swelling.  Skin: Positive for color change.  Neurological: Negative for weakness and numbness.  All other systems reviewed and are negative.   Allergies  Review of patient's allergies indicates no known allergies.  Home Medications   Current Outpatient Rx  Name  Route  Sig  Dispense  Refill  . enoxaparin (LOVENOX) 100 MG/ML injection   Subcutaneous   Inject 90 mg into the skin every 12 (twelve) hours.         Marland Kitchen oxyCODONE-acetaminophen (PERCOCET/ROXICET) 5-325 MG per tablet   Oral   Take 2 tablets by mouth every 6 (six) hours as needed for pain.   30 tablet   0   . traZODone (DESYREL) 50 MG tablet   Oral   Take 50 mg by mouth at bedtime as needed for sleep.          BP 122/77  Pulse 98  Temp(Src) 98.4 F (36.9 C) (Oral)  Resp 18  SpO2 100%  Physical Exam  Nursing note and vitals reviewed. Constitutional: He is oriented to person, place, and time. He appears well-developed and well-nourished. No distress.  HENT:  Head: Normocephalic and atraumatic.  Mouth/Throat: Oropharynx is clear and moist. No oropharyngeal exudate.  Eyes: Conjunctivae and EOM are normal. Pupils are equal, round, and reactive to light. No scleral icterus.  Neck: Normal range of motion.  Cardiovascular: Normal rate, regular rhythm, normal heart sounds and intact distal pulses.   Dorsalis pedis and  posterior tibial pulses 2+ bilaterally  Pulmonary/Chest: Effort normal. No respiratory distress. He has no wheezes.  Abdominal: Soft. He exhibits no distension. There is no tenderness.  Musculoskeletal: Normal range of motion. He exhibits edema (3+ pitting edema in LLE).  Left lower extremity diffusely edematous; 3+ pitting edema appreciated from mid calf down to the foot. Blanching erythema appreciated throughout with redness extending up the left medial thigh. Area tender to palpation. Extremity warm.   Neurological: He is alert and oriented to person, place, and time. He has normal reflexes.  No sensory or motor deficits appreciated. DTRs normal and symmetric.  Skin: He is not diaphoretic. There is erythema.  Psychiatric: He has a normal mood and affect. His behavior is normal.   ED Course  Procedures (including critical care time) Labs Reviewed  CBC WITH DIFFERENTIAL - Abnormal; Notable for the following:    RBC 3.63 (*)    Hemoglobin 12.0 (*)    HCT 35.3 (*)    Platelets 467 (*)    All other components within normal limits  BASIC METABOLIC PANEL - Abnormal; Notable for the following:    Sodium 130 (*)    Chloride 95 (*)    BUN 5 (*)    All other components within normal limits  PROTIME-INR  APTT  HEPARIN LEVEL (UNFRACTIONATED)   VASCULAR LAB  PRELIMINARY PRELIMINARY PRELIMINARY PRELIMINARY  Left lower extremity venous Doppler completed.  Preliminary report: There is extensive, acute, occlusive DVT noted throughout the left lower extremity beginning in the posterior tibial vein and coursing through to include the popliteal, femoral and common femoral veins. There is superficial thrombosis noted in the greater saphenous vein from the origin to mid thigh.  KANADY, CANDACE, RVT  03/26/2013, 4:43 PM  No results found.  1. DVT of lower limb, acute, left   2. Phlegmasia cerulea dolens, left    MDM  Patient presents for increased swelling of LLE. Patient on Lovenox BID; admits to noncompliance 4 days prior to 03/22/13 secondary to incarceration, but states he has been compliant since this time. Findings c/w phlegmasia Dolens. Dr. Archer Asa of IR consulted who states he will come and evaluate the patient. Will likely require thrombolysis or thrombectomy. Patient does have IVC filter. Will consult hospitalist for admission. 5K unit bolus of heparin ordered as well as subsequent weight based dosing of heparin infusion per pharmacy consult. No evidence of cellulitic process as patient  afebrile and without leukocytosis.   Dr. Darnelle Catalan to admit. Temporary admit orders placed.  Antony Madura, PA-C 03/26/13 1824  7:02 - Dr. Archer Asa requests transfer to [facility] for thrombolysis/thrombectomy procedure. EMTALA form completed and patient stable for transfer. Dr. Archer Asa also questioning superficial cellulitic process; however given lack of fever and leukocytosis have decided to hold IV abx at this time. Dr. Radford Pax in agreement.   Please page Dr. Archer Asa when patient leaves [facility] and when he arrives at [facility] in attempt to expediate thrombolysis procedure. He may be paged at [phone].  Antony Madura, PA-C 03/26/13 1904

## 2013-03-26 NOTE — Consult Note (Signed)
Interventional Radiology Consult Note  Date: 03/26/13 Reason for Consult:Left leg DVT, swelling, concern for phlegmasia Referring Physician: Lianne Moris, MD   HPI: [name] is an 47 y.o. male with PMH of alcoholism, tobacco abuse, homelessness and lupus anticoagulant disorder with prior venous thromboembolic disease with DVT/PE currently presenting to the ER with a chief complaint of redness and swelling of his left lower extremity. He is on chronic Lovenox therapy, 90 mg twice a day for his prior history of DVT. He had a 5 day interruption in his Lovenox secondary to being incarcerated (released on 03/22/13). The patient states he developed the redness, swelling and pain 4 days ago. He was evaluated in the ER but they could not do a Doppler that night and he was released and instructed to continue his usual dose of Lovenox (which he resumed after being released from jail).   His swelling has significantly increased over the past two days.  He has pain and difficulty ambulating but denies numbness or paresthesias.  He had a prior successful bilateral LE thrombolysis procedure by IR at [facility] in May of 2013.  He denies fever, chills or SOB.  Denies history of stroke, recent trauma, injury or procedure.  Denies recent bleeding including per rectum.  He has an IVC filter in place.  Past Medical History:  Past Medical History  Diagnosis Date  . Coronary artery disease   . Degenerative joint disease of spine   . Pulmonary embolism 06/2010  . Degenerative joint disease   . 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 ETOH abuse   . DVT (deep venous thrombosis)   . Hepatitis B   . Mental disorder   . Depression     Surgical History:  Past Surgical History  Procedure Laterality Date  . Left elbow surgery    . Tonsillectomy      Family History:  Family History  Problem Relation Age of Onset  . Cancer Mother     Ovarian    Social History:  reports that he has been smoking  Cigarettes.  He has a 60 pack-year smoking history. He has never used smokeless tobacco. He reports that  drinks alcohol. He reports that he does not use illicit drugs.  Allergies: No Known Allergies  Medications: I have reviewed the patient's current medications.  ROS: See HPI for pertinent findings, otherwise complete 10 system review negative.  Physical Exam: Blood pressure 117/54, pulse 103, temperature 99.9 F (37.7 C), temperature source Oral, resp. rate 18, SpO2 99.00%.  Gen: WNWD male in NAD Cardiac: RRR, no M/R/G Lungs: CTA B Abd: SOft, NT/ND Ext: Massive swelling of the LLE.  The left thigh is moderately swollen and several cm larger than the right.  He has mild erythema along the medial thigh.  Beginning at the knee, his lower leg is massively swollen and 50% larger than the right.  He has diffuse erythema along the anterior shin with areas of darker skin discoloration. 3+ pitting edema of the dorsal forefoot.   Pulses: 2+ doppler + pulses at the DP and AT.  The left femoral pulse is 2+ palpable Neuro: A&O x 4  Labs: CBC  Recent Labs  03/26/13 1654  WBC 9.8  HGB 12.0*  HCT 35.3*  PLT 467*   MET  Recent Labs  03/26/13 1654  NA 130*  K 3.9  CL 95*  CO2 23  GLUCOSE 86  BUN 5*  CREATININE 0.63  CALCIUM 9.2   No results found for  this basename: PROT, ALBUMIN, AST, ALT, ALKPHOS, BILITOT, BILIDIR, IBILI, LIPASE,  in the last 72 hours PT/INR  Recent Labs  03/26/13 1654  LABPROT 12.0  INR 0.90   ABG No results found for this basename: PHART, PCO2, PO2, HCO3,  in the last 72 hours  IMAGING:  VASCULAR LAB  PRELIMINARY PRELIMINARY PRELIMINARY PRELIMINARY  Left lower extremity venous Doppler completed.  Preliminary report: There is extensive, acute, occlusive DVT noted throughout the left lower extremity beginning in the posterior tibial vein and coursing through to include the popliteal, femoral and common femoral veins. There is superficial thrombosis  noted in the greater saphenous vein from the origin to mid thigh.  Sherren Kerns, RVT  03/26/2013, 4:43 PM    Assessment/Plan:  48 yo male with recurrent extensive acute LLE DVT and significant edema, particularly of the lower leg.  He may have some superimposed cellulitis anteriorly.   - Recommend full heparinization. - Transfer to [facility] for initiation of thrombolysis/thrombectomy procedure.  Patient has no contraindication to lysis.  Patient understands the risks, benefits and alternatives and wishes to proceed. - Consider empiric ABX for possible superimposed cellulitis. - Please keep NPO except for sips and chips   Signed,  Sterling Big, MD [facility]

## 2013-03-26 NOTE — ED Provider Notes (Signed)
Medical screening examination/treatment/procedure(s) were conducted as a shared visit with non-physician practitioner(s) and myself.  I personally evaluated the patient during the encounter    Nelia Shi, MD 03/26/13 1840

## 2013-03-26 NOTE — H&P (Addendum)
[facility] History and Physical  [name] AVW:[id number] DOB: Nov 25, 1965 DOA: 03/26/2013  Referring physician: Dr. Radford Pax PCP: Default, Provider, MD  Hematologist: Dr. Jethro Bolus  Chief Complaint:    History of Present Illness:  [name] is an 47 y.o. male with PMH of alcoholism, tobacco abuse, homelessness and lupus anticoagulant disorder with prior venous thromboembolic disease with DVT/PE who is status post IVC filter placement who presented to the emergency department with a chief complaint of redness and swelling of his left lower extremity. He is on chronic Lovenox therapy, 90 mg twice a day for his prior history of DVT. He had a 5 day interruption in his Lovenox secondary to being incarcerated (released on 03/22/13).  The patient states he developed the redness, swelling and pain 4 days ago.  He was evaluated in the ER but they could not do a Doppler that night and he was released and instructed to continue his usual dose of Lovenox (which he resumed after being released from jail) and to return in one week if he continues to have problems with swelling.  Patient has massive swelling of his left lower extremity and tells me he has had to have lysis of thrombus in the past. His first blood clot was approximately 4 years ago. He does not have a PCP, but does see Dr. Gaylyn Rong.  Review of Systems: Constitutional: No fever, no chills;  Appetite normal; No weight loss, no weight gain, no fatigue.  HEENT: No blurry vision, no diplopia, no pharyngitis, no dysphagia CV: No chest pain, no palpitations.  Resp: No SOB, no cough. GI: No nausea, no vomiting, no diarrhea, no melena, no hematochezia.  GU: No dysuria, no hematuria. MSK: no myalgias, no arthralgias.  Neuro:  No headache, no focal neurological deficits, no history of seizures.  Psych: + h/o depression and anxiety.  Endo: No heat intolerance, no cold intolerance, no polyuria, no polydipsia  Skin: +Erythematous left leg.  Heme: No easy  bruising.  Past Medical History Past Medical History  Diagnosis Date  . Coronary artery disease   . Degenerative joint disease of spine   . Pulmonary embolism 06/2010  . Degenerative joint disease   . 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 ETOH abuse   . DVT (deep venous thrombosis)   . Hepatitis B   . Mental disorder   . Depression      Past Surgical History Past Surgical History  Procedure Laterality Date  . Left elbow surgery    . Tonsillectomy       Social History: History   Social History  . Marital Status: Legally Separated    Spouse Name: N/A    Number of Children: N/A  . Years of Education: N/A   Occupational History  . Homeless   .     Social History Main Topics  . Smoking status: Current Every Day Smoker -- 2.00 packs/day for 30 years    Types: Cigarettes  . Smokeless tobacco: Never Used  . Alcohol Use: Yes     Comment: Drinks a 6-pack of beer daily.  . Drug Use: No  . Sexually Active: No   Other Topics Concern  . Not on file   Social History Narrative   Estranged from family.  Homeless.      Family History:  Family History  Problem Relation Age of Onset  . Cancer Mother     Ovarian    Allergies: Review of patient's allergies indicates no known allergies.  Meds: Prior to  Admission medications   Medication Sig Start Date End Date Taking? Authorizing Provider  enoxaparin (LOVENOX) 100 MG/ML injection Inject 90 mg into the skin every 12 (twelve) hours.   Yes Historical Provider, MD  oxyCODONE-acetaminophen (PERCOCET/ROXICET) 5-325 MG per tablet Take 2 tablets by mouth every 6 (six) hours as needed for pain. 03/22/13  Yes Ashby Dawes, MD  traZODone (DESYREL) 50 MG tablet Take 50 mg by mouth at bedtime as needed for sleep. 01/04/13  Yes Dorothea Ogle, MD    Physical Exam: Filed Vitals:   03/26/13 1434 03/26/13 1447  BP:  122/77  Pulse:  98  Temp:  98.4 F (36.9 C)  TempSrc:  Oral  Resp:  18  SpO2: 100% 100%      Physical Exam: Blood pressure 122/77, pulse 98, temperature 98.4 F (36.9 C), temperature source Oral, resp. rate 18, SpO2 100.00%. Gen: No acute distress. Head: Normocephalic, atraumatic. Eyes: PERRL, EOMI, sclerae nonicteric. Mouth: Oropharynx clear. No tonsillar exudates. Neck: Supple, no thyromegaly, no lymphadenopathy, no jugular venous distention. Chest: Lungs diminished but clear throughout. No wheezes, rales, or rhonchi. CV: Heart sounds are mildly tachycardic and without murmurs, rubs, or gallops. Abdomen: Soft, nontender, nondistended with normal active bowel sounds. Extremities: Left lower extremity massively edematous, erythematous, right lower extremity normal appearing. Skin: Warm and dry. Neuro: Alert and oriented times 3; cranial nerves II through XII grossly intact. Psych: Mood and affect normal.  Labs on Admission:  Basic Metabolic Panel:  Recent Labs Lab 03/26/13 1654  NA 130*  K 3.9  CL 95*  CO2 23  GLUCOSE 86  BUN 5*  CREATININE 0.63  CALCIUM 9.2   GFR The CrCl is unknown because both a height and weight (above a minimum accepted value) are required for this calculation. Liver Function Tests: No results found for this basename: AST, ALT, ALKPHOS, BILITOT, PROT, ALBUMIN,  in the last 168 hours No results found for this basename: LIPASE, AMYLASE,  in the last 168 hours No results found for this basename: AMMONIA,  in the last 168 hours Coagulation profile  Recent Labs Lab 03/26/13 1654  INR 0.90    CBC:  Recent Labs Lab 03/26/13 1654  WBC 9.8  NEUTROABS 6.8  HGB 12.0*  HCT 35.3*  MCV 97.2  PLT 467*   Microbiology No results found for this or any previous visit (from the past 240 hour(s)).    Radiological Exams on Admission: No results found.  Assessment/Plan Principal Problem:   Phlegmasia cerulea dolens, left lower extremity -Admit to the step down unit. Emergent interventional radiology consultation for consideration of  clot lysis. -Neurovascular checks every 4 hours per nursing staff. -IV heparin for now. Active Problems:   Alcohol abuse -Detoxification with Ativan per CIWA protocol. Supplement thiamine and folic acid.   Tobacco abuse -Tobacco cessation per nursing staff. Nicotine patch.   Lupus anticoagulant disorder -Lifelong anticoagulation needed.  Code Status: Full. Family Communication: Estranged from family.  Does not have an emergency contact. Disposition Plan: SW consult for homelessness.  Time spent: 65 minutes.  RAMA,CHRISTINA [facility] Pager [phone]  If 7PM-7AM, please contact night-coverage [email or web address] Password [email or web address] 03/26/2013, 6:18 PM

## 2013-03-26 NOTE — Progress Notes (Signed)
ANTICOAGULATION CONSULT NOTE - Initial Consult  Pharmacy Consult for Heparin Indication: DVT  No Known Allergies  Patient Measurements:  86kg 188cm = 74in IBW 82.2 Heparin Dosing Weight: 86kg  Vital Signs: Temp: 98.4 F (36.9 C) (07/20 1447) Temp src: Oral (07/20 1447) BP: 122/77 mmHg (07/20 1447) Pulse Rate: 98 (07/20 1447)  Labs:  Recent Labs  03/26/13 1654  HGB 12.0*  HCT 35.3*  PLT 467*  APTT 37  LABPROT 12.0  INR 0.90  CREATININE 0.63    The CrCl is unknown because both a height and weight (above a minimum accepted value) are required for this calculation.   Medical History: Past Medical History  Diagnosis Date  . Coronary artery disease   . Degenerative joint disease of spine   . Pulmonary embolism 06/2010  . Degenerative joint disease   . 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 ETOH abuse   . DVT (deep venous thrombosis)   . Hepatitis B   . Mental disorder   . Depression     PTA Medications:  Lovenox Percocet  Assessment: 47 yo homeless male w/ lupus anticoag positive and recurrent DVT, Hx PE, IVC filter. He is followed by Dr Gaylyn Rong @ [facility], last visit 01/19/13. Has previously been on Coumadin, but was not compliant with lab draws or taking Rx as directed so changed to Xarelto. He had propagation of this clot while on Xarelto so he was placed on Lovenox 90 mg twice a day. Was seen in ER on 7/16 w/ LE swelling and pain and reported hadn't taken Lovenox as prescribed for ~ 5 days because he was incarcerated, doppler study could not be performed so pt was d/c on Percocet for pain and d/c and instructed to continue Lovenox for VTE Tx. Pt returns to ER today for worsened sx of LE pain, increased swelling. Doppler confirms acute occlusive DVT LLE  PTT and INR wnl  Last dose of Lovenox was this morning at 0830  CBC ok  Goal of Therapy:  Heparin level 0.3-0.7 units/ml Monitor platelets by anticoagulation protocol: Yes   Plan:   Heparin bolus 5000  units - full bolus given normal aPTT despite pt report of Lovenox dose this AM  Heparin infusion 1600 units/hour  Heparin level at midnight  Daily Heparin level  Daily CBC  Gwen Her PharmD  [phone] 03/26/2013 5:35 PM

## 2013-03-27 ENCOUNTER — Inpatient Hospital Stay (HOSPITAL_COMMUNITY): Payer: Medicaid Other

## 2013-03-27 LAB — CBC
HCT: 34 % — ABNORMAL LOW (ref 39.0–52.0)
MCV: 98.6 fL (ref 78.0–100.0)
Platelets: 272 10*3/uL (ref 150–400)
RBC: 3.45 MIL/uL — ABNORMAL LOW (ref 4.22–5.81)
RDW: 14 % (ref 11.5–15.5)
WBC: 9.6 10*3/uL (ref 4.0–10.5)

## 2013-03-27 LAB — COMPREHENSIVE METABOLIC PANEL
ALT: 15 U/L (ref 0–53)
AST: 19 U/L (ref 0–37)
CO2: 24 mEq/L (ref 19–32)
Calcium: 8.5 mg/dL (ref 8.4–10.5)
Creatinine, Ser: 0.61 mg/dL (ref 0.50–1.35)
GFR calc Af Amer: >90 mL/min (ref >90)
GFR calc non Af Amer: >90 mL/min (ref >90)
Sodium: 132 mEq/L — ABNORMAL LOW (ref 135–145)
Total Protein: 6.4 g/dL (ref 6.0–8.3)

## 2013-03-27 LAB — HEMOGLOBIN AND HEMATOCRIT, BLOOD: HCT: 36.8 % — ABNORMAL LOW (ref 39.0–52.0)

## 2013-03-27 LAB — FIBRINOGEN: Fibrinogen: <60 mg/dL — CL (ref 204–475)

## 2013-03-27 MED ORDER — MIDAZOLAM HCL 2 MG/2ML IJ SOLN
INTRAMUSCULAR | Status: AC
  Filled 2013-03-27: qty 4

## 2013-03-27 MED ORDER — SODIUM CHLORIDE 0.9 % IJ SOLN: 3.0000 mL | INTRAMUSCULAR | Status: DC | PRN

## 2013-03-27 MED ORDER — SODIUM CHLORIDE 0.9 % IV SOLN
INTRAVENOUS | Status: DC
  Administered 2013-03-27: 10:00:00 via INTRAVENOUS

## 2013-03-27 MED ORDER — ONDANSETRON HCL 4 MG/2ML IJ SOLN: 4.0000 mg | Freq: Four times a day (QID) | INTRAMUSCULAR | Status: DC | PRN

## 2013-03-27 MED ORDER — SODIUM CHLORIDE 0.9 % IV SOLN: 250.0000 mL | INTRAVENOUS | Status: DC | PRN

## 2013-03-27 MED ORDER — MIDAZOLAM HCL 2 MG/2ML IJ SOLN
INTRAMUSCULAR | Status: AC | PRN
  Administered 2013-03-27: 1 mg via INTRAVENOUS
  Administered 2013-03-27: 0.5 mg via INTRAVENOUS
  Administered 2013-03-27: 1 mg via INTRAVENOUS
  Administered 2013-03-27: 0.5 mg via INTRAVENOUS

## 2013-03-27 MED ORDER — HEPARIN BOLUS VIA INFUSION
2500.0000 [IU] | Freq: Once | INTRAVENOUS | Status: AC
  Administered 2013-03-27: 2500 [IU] via INTRAVENOUS
  Filled 2013-03-27: qty 2500

## 2013-03-27 MED ORDER — TENECTEPLASE 50 MG IV KIT
0.2500 mg/h | PACK | INTRAVENOUS | Status: DC
  Filled 2013-03-27: qty 0.5

## 2013-03-27 MED ORDER — HEPARIN BOLUS VIA INFUSION
2500.0000 [IU] | Freq: Once | INTRAVENOUS | Status: DC
  Filled 2013-03-27: qty 2500

## 2013-03-27 MED ORDER — SODIUM CHLORIDE 0.9 % IJ SOLN: 3.0000 mL | Freq: Two times a day (BID) | INTRAMUSCULAR | Status: DC

## 2013-03-27 MED ORDER — FENTANYL CITRATE 0.05 MG/ML IJ SOLN
INTRAMUSCULAR | Status: AC | PRN
  Administered 2013-03-27 (×2): 50 ug via INTRAVENOUS
  Administered 2013-03-27 (×2): 25 ug via INTRAVENOUS

## 2013-03-27 MED ORDER — FENTANYL CITRATE 0.05 MG/ML IJ SOLN
INTRAMUSCULAR | Status: AC
  Filled 2013-03-27: qty 4

## 2013-03-27 MED ORDER — MIDAZOLAM HCL 2 MG/2ML IJ SOLN: 1.0000 mg | INTRAMUSCULAR | Status: DC | PRN

## 2013-03-27 MED ORDER — HEPARIN (PORCINE) IN NACL 100-0.45 UNIT/ML-% IJ SOLN
1200.0000 [IU]/h | INTRAMUSCULAR | Status: DC
  Administered 2013-03-27: 1200 [IU]/h via INTRAVENOUS

## 2013-03-27 MED ORDER — IOHEXOL 300 MG/ML  SOLN
100.0000 mL | Freq: Once | INTRAMUSCULAR | Status: AC | PRN
  Administered 2013-03-27: 65 mL via INTRAVENOUS

## 2013-03-27 MED ORDER — MORPHINE SULFATE 4 MG/ML IJ SOLN: 5.0000 mg | INTRAMUSCULAR | Status: DC | PRN

## 2013-03-27 MED ORDER — HEPARIN (PORCINE) IN NACL 100-0.45 UNIT/ML-% IJ SOLN
2200.0000 [IU]/h | INTRAMUSCULAR | Status: DC
  Administered 2013-03-27: 1550 [IU]/h via INTRAVENOUS
  Administered 2013-03-28: 2000 [IU]/h via INTRAVENOUS
  Administered 2013-03-28 – 2013-03-29 (×2): 2200 [IU]/h via INTRAVENOUS
  Filled 2013-03-27 (×2): qty 250

## 2013-03-27 NOTE — Progress Notes (Signed)
Dr Burley Saver notified for critical fibrigen level. No orders given. The 7p-7a nurse paged the IR MD on call with no answer. IR notified at 0700.

## 2013-03-27 NOTE — Progress Notes (Signed)
[facility] Progress Note [place] TEAM 1 - Stepdown/ICU [name] ZOX:[id number] DOB: 10-06-1965 DOA: 03/26/2013 PCP: Default, Provider, MD  Brief narrative: 47 y.o. 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 He is on chronic Lovenox therapy, 90 mg twice a day for his prior history of DVT. He had a 5 day interruption in his Lovenox secondary to being incarcerated (released on 03/22/13). The patient stated he developed the redness, swelling and pain 4 days prior to his admission. He was evaluated in the ER but they could not do a Doppler that night and he was released and instructed to continue his usual dose of Lovenox (which he resumed after being released from jail) and to return in one week if he continues to have problems with swelling. Patient had massive swelling of his left lower extremity and explained he had to have lysis of a thrombus in the past. His first blood clot was approximately 4 years ago. He does not have a PCP, but sees Dr. Gaylyn Rong.   Assessment/Plan:  Phlegmasia cerulea dolens, left lower extremity Now s/p IR intervention with TPA and TNK infusion - returned to IR today for angioplasty and angiojet as TNK had to be stopped due to falling fibrinogen level - cont heparin for now - probable return to lovenox for eventual d/c home (consider discussing w/ Dr. 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 On IV heparin for now - requires lifelong anticoag   Mild hyponatremia F/u in AM - related to EtOH abuse   Alcohol abuse Detoxification with Ativan per CIWA protocol - supplement thiamine and folic acid - no florid withdrawal at this time   Tobacco abuse  Hepatitis B  Code Status: FULL Family Communication:  no family present at time of exam Disposition Plan: SDU  Consultants: IR  Procedures: 7/20 - Venous doppler L LE - extensive, acute, occlusive DVT noted throughout the left lower extremity 7/20 - LLE venogram and cavogram - reflux of TPA into proximal calf veins - initiation of venous lysis at 0.5 mg TNK/hr 7/21 - FOLLOW-UP VENOGRAPHY - MECHANICAL THROMBECTOMY OF THE FEMORAL AND ILIAC VEINS - BALLOON ANGIOPLASTY OF FEMORAL AND ILIAC VEINS  Antibiotics: none  DVT prophylaxis: IV heparin  HPI/Subjective: Pt is alert and interactive.  Denies signif pain in the L leg.  No sob or chest pain.  Objective: Blood pressure 130/63, pulse 82, temperature 98.1 F (36.7 C), temperature source Oral, resp. rate 19, height 6\' 2"  (1.88 m), weight 86.2 kg (190 lb 0.6 oz), SpO2 96.00%.  Intake/Output Summary (Last 24 hours) at 03/27/13 1536 Last data filed at 03/27/13 1500  Gross per 24 hour  Intake 1863.4 ml  Output   1675 ml  Net  188.4 ml   Exam: General: No acute respiratory distress Lungs: Clear to auscultation bilaterally without wheezes or crackles Cardiovascular: Regular rate and rhythm without murmur gallop or rub normal S1 and S2 Abdomen: Nontender, nondistended, soft, bowel sounds positive, no rebound, no ascites, no appreciable mass Extremities: No significant cyanosis, clubbing;  massive 3++ edema L LE w/ diffuse erythema - trace edema only R LE  Data Reviewed: Basic Metabolic Panel:  Recent Labs Lab 03/26/13 1654 03/27/13 0510  NA 130* 132*  K 3.9 3.9  CL 95* 100  CO2 23 24  GLUCOSE 86 109*  BUN 5* 8  CREATININE 0.63 0.61  CALCIUM 9.2 8.5   Liver Function Tests:  Recent Labs Lab 03/26/13 1654 03/27/13 0510  AST 21 19  ALT 16 15  ALKPHOS 68 62  BILITOT 0.2* 0.5  PROT 7.5 6.4  ALBUMIN 3.0* 2.7*   CBC:  Recent Labs Lab 03/26/13 1654 03/27/13 0025 03/27/13 0510  WBC 9.8  --  9.6  NEUTROABS 6.8  --   --   HGB 12.0* 12.5* 11.4*  HCT 35.3* 36.8* 34.0*   MCV 97.2  --  98.6  PLT 467*  --  272    Recent Results (from the past 240 hour(s))  MRSA PCR SCREENING     Status: None   Collection Time    03/26/13  8:25 PM      Result Value Range Status   MRSA by PCR NEGATIVE  NEGATIVE Final   Comment:            The GeneXpert MRSA Assay (FDA     approved for NASAL specimens     only), is one component of a     comprehensive MRSA colonization     surveillance program. It is not     intended to diagnose MRSA     infection nor to guide or     monitor treatment for     MRSA infections.     Studies:  Recent x-ray studies have been reviewed in detail by the Attending Physician  Scheduled Meds:  Scheduled Meds: . alteplase  2 mg Intracatheter Once  . alteplase  2 mg Intracatheter Once  . folic acid  1 mg Oral Daily  . LORazepam  0-4 mg Intravenous Q6H   Followed by  . [START ON 03/28/2013] LORazepam  0-4 mg Intravenous Q12H  . multivitamin with minerals  1 tablet Oral Daily  . nicotine  21 mg Transdermal Daily  . sodium chloride  3 mL Intravenous Q12H  . sodium chloride  3 mL Intravenous Q12H  . thiamine  100 mg Oral Daily   Or  . thiamine  100 mg Intravenous Daily   Continuous Infusions: . sodium chloride 25 mL/hr at 03/27/13 1025  . heparin 1,200 Units/hr (03/27/13 1057)    Time spent on care of this patient:   [facility] T  [facility] Office  [phone] Pager - Text Page per Loretha Stapler as per below:  On-Call/Text Page:      [email or web address]      password [email or web address]  If 7PM-7AM, please contact night-coverage [email or web address] Password [facility] 03/27/2013, 3:36 PM   LOS: 1 day

## 2013-03-27 NOTE — Care Management Note (Signed)
    Page 1 of 2   04/06/2013     4:49:56 PM   CARE MANAGEMENT NOTE 04/06/2013  Patient:  Lok,Jermine   Account Number:  [id number]  Date Initiated:  03/27/2013  Documentation initiated by:  Junius Creamer  Subjective/Objective Assessment:   adm w dvt     Action/Plan:   chart states is homeless, will make sw ref   Anticipated DC Date:  04/05/2013   Anticipated DC Plan:  SKILLED NURSING FACILITY  In-house referral  Clinical Social Worker      DC Planning Services  CM consult      Choice offered to / List presented to:             Status of service:  Completed, signed off Medicare Important Message given?   (If response is "NO", the following Medicare IM given date fields will be blank) Date Medicare IM given:   Date Additional Medicare IM given:    Discharge Disposition:  SKILLED NURSING FACILITY  Per UR Regulation:  Reviewed for med. necessity/level of care/duration of stay  If discussed at Long Length of Stay Meetings, dates discussed:    Comments:  04/05/13 Raynelle Fanning Tessla Spurling,RN,BSN [phone] PT DISCHARGED TODAY TO SNF, PER CSW ARRANGEMENTS.  04/04/13 Safira Proffit,RN,BSN [phone] CSW CONT TO PURSUE POSSIBLE PLACEMENT OPTIONS FOR PT, AS HE STILL HAS PROBLEMS AMBULATING DUE TO LARGE CLOT IN LOWER LEG.  ASST DIRECTOR ZACK BROOKS STATES NO BED OFFERS PRESENTLY; HE PLANS TO SPEAK WITH FACILITY ADMINISTRATORS [facility].  PT STATES HE COULD STAY WITH BROTHER IN LAW ONLY A DAY OR TWO.   WILL FOLLOW.  03/31/13 Makeyla Govan,RN,BSN [phone] CSW CONSULTED FOR POSSIBLE DC TO ASSISTED LIVING FACILITY WHILE LEG IS HEALING.  WILL FOLLOW PROGRESS.  03/30/13 Jehad Bisono,RN,BSN [phone] PT ORIGINALLY GOING TO DC ON LOVENOX THERAPY...PT LIVES IN A [place] IN [place], AND IS HOMELESS.  LIKELY WILL NOT FOLLOW UP WITH LOVENOX/COUMADIN THERAPY, AND NEEDED PT/INR DRAWS.  CHECKED XARELTO BENEFITS:  XARELTO IS ON MEDICAID PREFERRED RX DRUG FORMULARY, AND HE WILL BE ABLE TO OBTAIN MED FOR  $3-4.  MD TO CHANGE THERAPY TO XARELTO FOR EASE OF ADMINISTRATION AND FOLLOW UP.

## 2013-03-27 NOTE — Progress Notes (Addendum)
ANTICOAGULATION CONSULT NOTE - Initial Consult  Pharmacy Consult for heparin Indication: DVT LLE   (now off TNKase)   No Known Allergies  Patient Measurements: Height: 6\' 2"  (188 cm) Weight: 190 lb 0.6 oz (86.2 kg) IBW/kg (Calculated) : 82.2 Heparin Dosing Weight:  82.2 kg  Vital Signs: Temp: 98.1 F (36.7 C) (07/21 1220) Temp src: Oral (07/21 1220) BP: 120/66 mmHg (07/21 1700) Pulse Rate: 72 (07/21 1715)  Labs:  Recent Labs  03/26/13 1654 03/26/13 2108 03/27/13 0025 03/27/13 0510 03/27/13 1441  HGB 12.0*  --  12.5* 11.4*  --   HCT 35.3*  --  36.8* 34.0*  --   PLT 467*  --   --  272  --   APTT 37  --   --   --   --   LABPROT 12.0 12.4  --   --   --   INR 0.90 0.94  --   --   --   HEPARINUNFRC  --   --   --  <0.10* <0.10*  CREATININE 0.63  --   --  0.61  --     Estimated Creatinine Clearance: 132.7 ml/min (by C-G formula based on Cr of 0.61).   Medical History: Past Medical History  Diagnosis Date  . Coronary artery disease   . Degenerative joint disease of spine   . Pulmonary embolism 06/2010  . Degenerative joint disease   . 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 ETOH abuse   . DVT (deep venous thrombosis)   . Hepatitis B   . Mental disorder   . Depression     Medications:  Prescriptions prior to admission  Medication Sig Dispense Refill  . enoxaparin (LOVENOX) 100 MG/ML injection Inject 90 mg into the skin every 12 (twelve) hours.      Marland Kitchen oxyCODONE-acetaminophen (PERCOCET/ROXICET) 5-325 MG per tablet Take 2 tablets by mouth every 6 (six) hours as needed for pain.  30 tablet  0  . traZODone (DESYREL) 50 MG tablet Take 50 mg by mouth at bedtime as needed for sleep.       Scheduled:  . alteplase  2 mg Intracatheter Once  . alteplase  2 mg Intracatheter Once  . fentaNYL      . folic acid  1 mg Oral Daily  . LORazepam  0-4 mg Intravenous Q6H   Followed by  . [START ON 03/28/2013] LORazepam  0-4 mg Intravenous Q12H  . midazolam      .  multivitamin with minerals  1 tablet Oral Daily  . nicotine  21 mg Transdermal Daily  . sodium chloride  3 mL Intravenous Q12H  . sodium chloride  3 mL Intravenous Q12H  . thiamine  100 mg Oral Daily   Or  . thiamine  100 mg Intravenous Daily     Assessment: 47 y.o male with lupus anticoag positive with recurrent DVT, h/o PE and IVC filter.  Doppler confirms  Heparin level remains undetectable (<0.1) on heparin 1200 units/hr.  Now off TNKase infusion since ~ 11AM thus goal of heparin level has been increased to 0.3-0.7 units/ml.  Last year this patient has required up to 2500 units/hr of IV heparin to achieve therapeutic heparin levels.    Goal of Therapy:  Heparin level 0.3-0.7 units/ml Monitor platelets by anticoagulation protocol: Yes   Plan:  Will give heparin 2500 units IV bolus and  increase heparin gtt by 4 units/kg/hr to 1550 units/hr and check level in 6hr.  Noah Delaine, RPh Clinical Pharmacist  Pager: [phone] 03/27/2013,5:05 PM  Addendum:  Patient just returned from IR per RN's report.  S/p IR procedure this PM for thrombectomy/balloon angioplasty femoral & iliac veins. Remains  OFF of TNKase(fibrinogen <60), hgb 11.4, pltc 242K , no bleeding noted.   Plan:  Proceed with plan as noted above.   Noah Delaine, RPh Clinical Pharmacist Pager: [phone] 03/27/2013 , 6:14 Pm

## 2013-03-27 NOTE — Progress Notes (Signed)
ANTICOAGULATION CONSULT NOTE - Follow Up Consult  Pharmacy Consult for heparin (TNKase currently running) Indication: DVT (now s/p lysis)  No Known Allergies  Patient Measurements: Height: 6\' 2"  (188 cm) Weight: 190 lb 0.6 oz (86.2 kg) IBW/kg (Calculated) : 82.2  Vital Signs: Temp: 99.6 F (37.6 C) (07/20 2017) Temp src: Oral (07/20 2017) BP: 152/89 mmHg (07/21 0015) Pulse Rate: 108 (07/21 0015)  Labs:  Recent Labs  03/26/13 1654 03/26/13 2108  HGB 12.0*  --   HCT 35.3*  --   PLT 467*  --   APTT 37  --   LABPROT 12.0 12.4  INR 0.90 0.94  CREATININE 0.63  --     Estimated Creatinine Clearance: 132.7 ml/min (by C-G formula based on Cr of 0.63).   Medications:  Prescriptions prior to admission  Medication Sig Dispense Refill  . enoxaparin (LOVENOX) 100 MG/ML injection Inject 90 mg into the skin every 12 (twelve) hours.      Marland Kitchen oxyCODONE-acetaminophen (PERCOCET/ROXICET) 5-325 MG per tablet Take 2 tablets by mouth every 6 (six) hours as needed for pain.  30 tablet  0  . traZODone (DESYREL) 50 MG tablet Take 50 mg by mouth at bedtime as needed for sleep.       Scheduled:  . alteplase  2 mg Intracatheter Once  . alteplase  2 mg Intracatheter Once  . folic acid  1 mg Oral Daily  . LORazepam  0-4 mg Intravenous Q6H   Followed by  . [START ON 03/28/2013] LORazepam  0-4 mg Intravenous Q12H  . multivitamin with minerals  1 tablet Oral Daily  . nicotine  21 mg Transdermal Daily  . sodium chloride  3 mL Intravenous Q12H  . sodium chloride  3 mL Intravenous Q12H  . thiamine  100 mg Oral Daily   Or  . thiamine  100 mg Intravenous Daily   Infusions:  . heparin 800 Units/hr (03/27/13 0015)  . tenecteplase (TNKase) infusion      Assessment: 47yo male c/o leg swelling x3d with doubling in size over 24hr despite Lovenox full-dose tx (pt states he is compliant with LMWH since last seen at [facility] though has been noncompliant in the past), taken to IR for lysis, now TNKase is  running, to continue heparin begun prior to procedure.  Goal of Therapy:  Heparin level 0.2-0.5 units/ml Monitor platelets by anticoagulation protocol: Yes   Plan:  Heparin gtt begun by radiologist at 800 units/hr (had been running at 1600 units/hr) = 10 units/kg/hr; last year pt req'd up to 2500 units/hr for full therapeutic goal; will continue gtt and monitor heparin levels and CBC.  Vernard Gambles, PharmD, BCPS  03/27/2013,12:24 AM

## 2013-03-27 NOTE — Procedures (Signed)
Procedure:  Follow up of left LE veins during thrombolysis, mechanical thrombectomy and venous angioplasty. Findings:  Improved flow through native veins.  TNK had to be discontinued earlier today due to persistent fibrinogen level of <60. Angiojet mechanical thrombectomy performed throughout femoral and iliac veins.  Balloon angioplasty with 7 mm balloon of femoral and iliac veins.  Balloon angioplasty with 9 mm balloon of common femoral and iliac veins.  Improved flow with residual narrowing, especially of iliac veins diffusely, suggesting chronic thrombus.  No indication to stent, as it would require multiple stents and crossing the inguinal ligament. Plan:  D/C thrombolysis.  Continue IV heparin per Pharmacy.  Transition to additional anticoagulant.

## 2013-03-27 NOTE — ED Notes (Signed)
Unable to start new sedation narrator; pt here for f/u lysis

## 2013-03-27 NOTE — Progress Notes (Signed)
Dr Burley Saver notified of critical labs pt not on TNK at this time. See MD notes .

## 2013-03-27 NOTE — Progress Notes (Signed)
ANTICOAGULATION CONSULT NOTE - Follow Up Consult  Pharmacy Consult for heparin Indication: DVT w/ TNKase running  Labs:  Recent Labs  03/26/13 1654 03/26/13 2108 03/27/13 0025 03/27/13 0510  HGB 12.0*  --  12.5* 11.4*  HCT 35.3*  --  36.8* 34.0*  PLT 467*  --   --  272  APTT 37  --   --   --   LABPROT 12.0 12.4  --   --   INR 0.90 0.94  --   --   HEPARINUNFRC  --   --   --  <0.10*  CREATININE 0.63  --   --  0.61    Assessment: 47yo male undetectable on heparin with initial dosing while TNKase running and low goal.  Goal of Therapy:  Heparin level 0.2-0.5 units/ml   Plan:  Will increase heparin gtt by 4 units/kg/hr to 1200 units/hr and check level in 6hr.  Vernard Gambles, PharmD, BCPS  03/27/2013,6:38 AM

## 2013-03-28 DIAGNOSIS — D6859 Other primary thrombophilia: Secondary | ICD-10-CM

## 2013-03-28 DIAGNOSIS — F1010 Alcohol abuse, uncomplicated: Secondary | ICD-10-CM

## 2013-03-28 DIAGNOSIS — I80299 Phlebitis and thrombophlebitis of other deep vessels of unspecified lower extremity: Principal | ICD-10-CM

## 2013-03-28 LAB — CBC
HCT: 33.1 % — ABNORMAL LOW (ref 39.0–52.0)
HCT: 34.4 % — ABNORMAL LOW (ref 39.0–52.0)
Hemoglobin: 11.3 g/dL — ABNORMAL LOW (ref 13.0–17.0)
Hemoglobin: 11.4 g/dL — ABNORMAL LOW (ref 13.0–17.0)
MCH: 33.5 pg (ref 26.0–34.0)
MCH: 32.6 pg (ref 26.0–34.0)
MCHC: 34.1 g/dL (ref 30.0–36.0)
MCV: 98.3 fL (ref 78.0–100.0)
RBC: 3.37 MIL/uL — ABNORMAL LOW (ref 4.22–5.81)
RBC: 3.5 MIL/uL — ABNORMAL LOW (ref 4.22–5.81)
WBC: 9.3 10*3/uL (ref 4.0–10.5)

## 2013-03-28 LAB — FIBRINOGEN
Fibrinogen: 134 mg/dL — ABNORMAL LOW (ref 204–475)
Fibrinogen: 161 mg/dL — ABNORMAL LOW (ref 204–475)
Fibrinogen: 249 mg/dL (ref 204–475)

## 2013-03-28 LAB — COMPREHENSIVE METABOLIC PANEL
ALT: 11 U/L (ref 0–53)
Alkaline Phosphatase: 60 U/L (ref 39–117)
BUN: 8 mg/dL (ref 6–23)
CO2: 24 mEq/L (ref 19–32)
Calcium: 8.2 mg/dL — ABNORMAL LOW (ref 8.4–10.5)
GFR calc Af Amer: >90 mL/min (ref >90)
GFR calc non Af Amer: >90 mL/min (ref >90)
Glucose, Bld: 97 mg/dL (ref 70–99)
Potassium: 3.7 mEq/L (ref 3.5–5.1)
Total Protein: 5.6 g/dL — ABNORMAL LOW (ref 6.0–8.3)

## 2013-03-28 LAB — HEPARIN LEVEL (UNFRACTIONATED): Heparin Unfractionated: 0.31 IU/mL (ref 0.30–0.70)

## 2013-03-28 MED ORDER — HEPARIN BOLUS VIA INFUSION
3000.0000 [IU] | Freq: Once | INTRAVENOUS | Status: AC
  Administered 2013-03-28: 3000 [IU] via INTRAVENOUS
  Filled 2013-03-28: qty 3000

## 2013-03-28 NOTE — Progress Notes (Signed)
ANTICOAGULATION CONSULT NOTE - Follow Up Consult  Pharmacy Consult for heparin Indication: DVT s/p TNKase  Labs:  Recent Labs  03/26/13 1654 03/26/13 2108 03/27/13 0025 03/27/13 0510 03/27/13 1441 03/28/13 0030  HGB 12.0*  --  12.5* 11.4*  --  11.3*  HCT 35.3*  --  36.8* 34.0*  --  33.1*  PLT 467*  --   --  272  --  246  APTT 37  --   --   --   --   --   LABPROT 12.0 12.4  --   --   --   --   INR 0.90 0.94  --   --   --   --   HEPARINUNFRC  --   --   --  <0.10* <0.10* 0.18*  CREATININE 0.63  --   --  0.61  --   --     Assessment: 47yo male remains subtherapeutic on heparin after rate increase s/p TNKase.  Goal of Therapy:  Heparin level 0.3-0.7 units/ml   Plan:  Will rebolus with heparin 3000 units and increase gtt by 4 units/kg/hr to 2000 units/hr and check level in 6hr.  Vernard Gambles, PharmD, BCPS  03/28/2013,2:58 AM

## 2013-03-28 NOTE — Progress Notes (Signed)
Pt sleeping most of AM not agitation noted. See CIWA protocol in notes. No Ativan given at this time.

## 2013-03-28 NOTE — Progress Notes (Signed)
Subjective: Patient s/p TNKase 7/20 and mechanical angiojet and PTA of left femoral and iliac veins 7/21. TNKase discontinued 7/21 secondary to low fibrinogen of 60. Patient is on IV heparin, noted to be subtherapeutic today with a re-bolus and gtt started. He is c/o back pain and LLE pain, he states pain is similar to previous days, no change. He denies any pain at left popliteal sheath site, denies any bleeding at site.   Objective: Physical Exam: BP 105/64  Pulse 83  Temp(Src) 97.9 F (36.6 C) (Oral)  Resp 20  Ht 6\' 2"  (1.88 m)  Wt 190 lb 0.6 oz (86.2 kg)  BMI 24.39 kg/m2  SpO2 93%  Heart- RRR without M/G/R Lungs- Dimnished throughout Extremities- LLE edematous, erythematous, popliteal sheath site dressing C/D/I, no signs of bleeding, hematoma, or infection. RLE WNL.   Labs: CBC  Recent Labs  03/28/13 0030 03/28/13 0345  WBC 9.1 9.3  HGB 11.3* 11.4*  HCT 33.1* 34.4*  PLT 246 255   BMET  Recent Labs  03/27/13 0510 03/28/13 0345  NA 132* 133*  K 3.9 3.7  CL 100 100  CO2 24 24  GLUCOSE 109* 97  BUN 8 8  CREATININE 0.61 0.60  CALCIUM 8.5 8.2*   LFT  Recent Labs  03/26/13 1654  03/28/13 0345  PROT 7.5  < > 5.6*  ALBUMIN 3.0*  < > 2.4*  AST 21  < > 19  ALT 16  < > 11  ALKPHOS 68  < > 60  BILITOT 0.2*  < > 0.2*  BILIDIR <0.1  --   --   IBILI NOT CALCULATED  --   --   < > = values in this interval not displayed. PT/INR  Recent Labs  03/26/13 1654 03/26/13 2108  LABPROT 12.0 12.4  INR 0.90 0.94     Studies/Results: Ir Veno/ext/uni Left  03/26/2013   *RADIOLOGY REPORT*  IR LEFT LOWER EXTREMITY VENOGRAM; IR VENOGRAM IVC; IR INFUSION OF VENOUS THROMBOLYTIC INITIAL  Date: 03/26/2013  Clinical History: 47 year old male with a 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  He has a history of prior vena thromboembolism with a successful bilateral lower extremity lysis procedure done in May 2013.  He has been on Lovenox therapy which  has been successful for until recently when he was incarcerated and missed 5 days and is therapeutic regimen.  He began experiencing progressive left lower extremity swelling shortly thereafter.  He now presents and extremity with massive left lower extremity swelling and extensive superficial and deep venous thrombus.  Procedures Performed: 1. Ultrasound-guided puncture of the left popliteal vein 2.  Dilated left lower extremity venogram 3.  Successful catheterization of the inferior vena cava 4.  Inferior vena cava gram 5.  Balloon occluded reflux of TPA into the proximal calf veins 6.  Placement of multi side-hole infusion catheter initiation of venous thrombolysis  Interventional Radiologist:  Sterling Big, MD  Sedation: Moderate (conscious) sedation was used.  Six mg Versed, 200 mcg Fentanyl were administered intravenously.  The patient's vital signs were monitored continuously by radiology nursing throughout the procedure.  Sedation Time: 15 minutes  Fluoroscopy time: 16 minutes  Contrast volume: 40 ml Omnipaque-300  Additional medications:  4 mg TPA administered intravenously  PROCEDURE/FINDINGS:   Informed consent was obtained from the patient following explanation of the procedure, risks, benefits and alternatives. The patient understands, agrees and consents for the procedure. All questions were addressed. A time out was performed.  Maximal barrier sterile  technique utilized including caps, mask, sterile gowns, sterile gloves, large sterile drape, hand hygiene, and betadine skin prep.  The left popliteal fossa was interrogated with ultrasound.  The popliteal vein is thrombosed.  Local anesthesia was attained by infiltration of 1% lidocaine.  Under direct sonographic guidance, the vessels punctured with a 21-gauge micropuncture needle.  An image was obtained and stored for the medical record.  A Benson wire was advanced into the right femoral vein and a 7- [place] vascular sheath advanced over the  wire.  Through the sheath, a limited left lower extremity venogram was performed.  There is extensive thrombus throughout the popliteal and femoral vein. Using a vertebral catheter and glide wire, the catheter was successfully navigated the common femoral vein.  A venogram demonstrates complete occlusion of the external and common iliac veins.  There is an extensive network of collateral vessels. Thrombus is also seen extending into the great saphenous vein. After an extensive manipulation, the catheter was finally navigated into the inferior vena cava.  An inferior vena cava gram confirms patency of the cava and IVC filter.  A wire was advanced into the inferior vena cava.  The sheath was pulled back to near the insertion point in the popliteal vein.  A 6 mm balloon was advanced into the popliteal vein above the knee and inflated.  A gentle hand injection of contrast material confirmed reflux into the proximal calf veins which are thrombosed.  A total of 4 mg of TPA was then slowly injected and refluxed into the proximal calf veins.  The sheath was re-advanced into the more distal popliteal vein.  A 90 cm length, 50 cm infusion Unifuse infusion catheter was then advanced over the wire and positioned so that it covered the common, external, common femoral and the majority of the femoral veins.  The sheath and catheter was secured with a post silk suture and a large sterile bandage.  Thrombolysis was initiated at 0.5 mg TNK- tPA per hour.  IMPRESSION:  1.  Left lower extremity venogram demonstrates complete occlusion of the left common and external iliac veins with extensive thrombus from the common femoral vein into the proximal calf veins as well as within the great saphenous vein.  2.  Reflux of 4 mg of TPA into the proximal calf veins.  3.  Placement of a multi side-hole infusion catheter from the common iliac vein into the distal femoral vein.  4.  Initiation of venous thrombolysis.  The patient will return to  interventional radiology in 12 - 18 hours for venogram and lysis check.  I expect the patient will acquire angioplasty, and likely stenting of the left common and external iliac veins.  Signed,  Sterling Big, MD [facility] Radiology   Original Report Authenticated By: Malachy Moan, M.D.   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  He has a history of prior vena thromboembolism with a successful bilateral lower extremity lysis procedure done in May 2013.  He has been on Lovenox therapy which has been successful for until recently when he was incarcerated and missed 5 days and is therapeutic regimen.  He began experiencing progressive left lower extremity swelling shortly thereafter.  He now presents and extremity with massive left lower extremity swelling and extensive superficial  and deep venous thrombus.  Procedures Performed: 1. Ultrasound-guided puncture of the left popliteal vein 2.  Dilated left lower extremity venogram 3.  Successful catheterization of the inferior vena cava 4.  Inferior vena cava gram 5.  Balloon occluded reflux of TPA into the proximal calf veins 6.  Placement of multi side-hole infusion catheter initiation of venous thrombolysis  Interventional Radiologist:  Sterling Big, MD  Sedation: Moderate (conscious) sedation was used.  Six mg Versed, 200 mcg Fentanyl were administered intravenously.  The patient's vital signs were monitored continuously by radiology nursing throughout the procedure.  Sedation Time: 15 minutes  Fluoroscopy time: 16 minutes  Contrast volume: 40 ml Omnipaque-300  Additional medications:  4 mg TPA administered intravenously  PROCEDURE/FINDINGS:   Informed consent was obtained  from the patient following explanation of the procedure, risks, benefits and alternatives. The patient understands, agrees and consents for the procedure. All questions were addressed. A time out was performed.  Maximal barrier sterile technique utilized including caps, mask, sterile gowns, sterile gloves, large sterile drape, hand hygiene, and betadine skin prep.  The left popliteal fossa was interrogated with ultrasound.  The popliteal vein is thrombosed.  Local anesthesia was attained by infiltration of 1% lidocaine.  Under direct sonographic guidance, the vessels punctured with a 21-gauge micropuncture needle.  An image was obtained and stored for the medical record.  A Benson wire was advanced into the right femoral vein and a 7- [place] vascular sheath advanced over the wire.  Through the sheath, a limited left lower extremity venogram was performed.  There is extensive thrombus throughout the popliteal and femoral vein. Using a vertebral catheter and glide wire, the catheter was successfully navigated the common femoral vein.  A venogram demonstrates complete occlusion of the external and common iliac veins.  There is an extensive network of collateral vessels. Thrombus is also seen extending into the great saphenous vein. After an extensive manipulation, the catheter was finally navigated into the inferior vena cava.  An inferior vena cava gram confirms patency of the cava and IVC filter.  A wire was advanced into the inferior vena cava.  The sheath was pulled back to near the insertion point in the popliteal vein.  A 6 mm balloon was advanced into the popliteal vein above the knee and inflated.  A gentle hand injection of contrast material confirmed reflux into the proximal calf veins which are thrombosed.  A total of 4 mg of TPA was then slowly injected and refluxed into the proximal calf veins.  The sheath was re-advanced into the more distal popliteal vein.  A 90 cm length, 50 cm infusion Unifuse infusion  catheter was then advanced over the wire and positioned so that it covered the common, external, common femoral and the majority of the femoral veins.  The sheath and catheter was secured with a post silk suture and a large sterile bandage.  Thrombolysis was initiated at 0.5 mg TNK- tPA per hour.  IMPRESSION:  1.  Left lower extremity venogram demonstrates complete occlusion of the left common and external iliac veins with extensive thrombus from the common femoral vein into the proximal calf veins as well as within the great saphenous vein.  2.  Reflux of 4 mg of TPA into the proximal calf veins.  3.  Placement of a multi side-hole infusion catheter from the common iliac vein into the distal femoral vein.  4.  Initiation of venous thrombolysis.  The patient will return to interventional radiology in 12 -  18 hours for venogram and lysis check.  I expect the patient will acquire angioplasty, and likely stenting of the left common and external iliac veins.  Signed,  Sterling Big, MD [facility] Radiology   Original Report Authenticated By: Malachy Moan, M.D.   Ir Pta Venous Left  03/27/2013   *RADIOLOGY REPORT*  Clinical Data: Extensive left lower extremity iliofemoral DVT and status post initiation of transcatheter thrombolytic therapy yesterday.  Tenecteplase infusion had to be discontinued earlier today due to precipitous drop in fibrinogen to dangerously low levels after overnight thrombolytic therapy.  1.  FOLLOW-UP VENOGRAPHY DURING VENOUS THROMBOLYSIS, FINAL DAY. 2.  MECHANICAL THROMBECTOMY OF THE FEMORAL AND ILIAC VEINS. 3.  BALLOON ANGIOPLASTY OF FEMORAL AND ILIAC VEINS.  Comparison:  Imaging on 03/26/2013.  Sedation: 3.0 mg IV Versed; 150 mcg IV Fentanyl.  Total Moderate Sedation Time: 50 minutes.  Contrast:  65 ml Omnipaque-300  Fluoroscopy Time: 7 minutes and 12 seconds.  Procedure:  The procedure, risks, benefits, and alternatives were explained  to the patient.  Questions regarding the procedure were encouraged and answered.  The patient understands and consents to the procedure.  The left popliteal fossa sheath and infusion catheter were prepped with Betadine in a sterile fashion, and a sterile drape was applied covering the operative field.  A sterile gown and sterile gloves were used for the procedure.  Venography was initially performed through the infusion catheter followed by a sheath.  Infusion catheter was removed over a guidewire.  Mechanical thrombectomy was performed throughout the femoral and iliac veins with the AngioJet device.  Additional venography was performed through the sheath as well as the AngioJet catheter.  Balloon angioplasty was performed in the thigh at the level of the femoral vein as well as throughout the entire common femoral vein, external iliac vein and common iliac vein with a 7 mm x 4 cm Conquest balloon.  Additional venography was performed.  Additional balloon angioplasty was performed of the common femoral vein, external iliac vein and common iliac vein with a 9 mm x 4 cm Mustang balloon.  Additional venography was performed.  The popliteal vein sheath and guidewire were then removed and hemostasis obtained with manual compression.  Complications: None  Findings: Patency of the native deep veins showed significant improvement compared to pre thrombolytic therapy venography. 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  After AngioJet mechanical thrombectomy, marginal improvement was noted in venous patency.  After balloon angioplasty, further improvement was noted at the level of the femoral vein of the thigh, the common femoral vein and external and common iliac veins. Further dilatation of the common femoral vein and iliac veins were performed with a larger balloon.   There is persistent diffuse stenosis present of the deep venous system.  However, stent placement was not felt necessary as it would require complete reconstruction of the iliac veins as well as the common femoral vein and likely necessitate crossing the inguinal ligament.  IMPRESSION: Improved patency of the deep venous system of the left lower extremity including the femoral veins and iliac veins after overnight thrombolytic therapy.  Thrombolytic therapy had to be discontinued due to precipitous drop in fibrinogen level.  Patency of the veins was further improved today after mechanical thrombectomy and extensive venous angioplasty.  Popliteal vein access was removed upon completion of the procedure.  Original Report Authenticated By: Irish Lack, M.D.   Ir Thrombect Veno Mech Mod Sed  03/27/2013   *RADIOLOGY REPORT*  Clinical Data: Extensive left lower extremity iliofemoral DVT and status post initiation of transcatheter thrombolytic therapy yesterday.  Tenecteplase infusion had to be discontinued earlier today due to precipitous drop in fibrinogen to dangerously low levels after overnight thrombolytic therapy.  1.  FOLLOW-UP VENOGRAPHY DURING VENOUS THROMBOLYSIS, FINAL DAY. 2.  MECHANICAL THROMBECTOMY OF THE FEMORAL AND ILIAC VEINS. 3.  BALLOON ANGIOPLASTY OF FEMORAL AND ILIAC VEINS.  Comparison:  Imaging on 03/26/2013.  Sedation: 3.0 mg IV Versed; 150 mcg IV Fentanyl.  Total Moderate Sedation Time: 50 minutes.  Contrast:  65 ml Omnipaque-300  Fluoroscopy Time: 7 minutes and 12 seconds.  Procedure:  The procedure, risks, benefits, and alternatives were explained to the patient.  Questions regarding the procedure were encouraged and answered.  The patient understands and consents to the procedure.  The left popliteal fossa sheath and infusion catheter were prepped with Betadine in a sterile fashion, and a sterile drape was applied covering the operative field.  A sterile gown and sterile gloves were used  for the procedure.  Venography was initially performed through the infusion catheter followed by a sheath.  Infusion catheter was removed over a guidewire.  Mechanical thrombectomy was performed throughout the femoral and iliac veins with the AngioJet device.  Additional venography was performed through the sheath as well as the AngioJet catheter.  Balloon angioplasty was performed in the thigh at the level of the femoral vein as well as throughout the entire common femoral vein, external iliac vein and common iliac vein with a 7 mm x 4 cm Conquest balloon.  Additional venography was performed.  Additional balloon angioplasty was performed of the common femoral vein, external iliac vein and common iliac vein with a 9 mm x 4 cm Mustang balloon.  Additional venography was performed.  The popliteal vein sheath and guidewire were then removed and hemostasis obtained with manual compression.  Complications: None  Findings: Patency of the native deep veins showed significant improvement compared to pre thrombolytic therapy venography. 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  After AngioJet mechanical thrombectomy, marginal improvement was noted in venous patency.  After balloon angioplasty, further improvement was noted at the level of the femoral vein of the thigh, the common femoral vein and external and common iliac veins. Further dilatation of the common femoral vein and iliac veins were performed with a larger balloon.  There is persistent diffuse stenosis present of the deep venous system.  However, stent placement was not felt necessary as it would require complete reconstruction of the iliac veins as well as the common femoral vein and likely necessitate crossing the inguinal ligament.  IMPRESSION: Improved patency of the deep venous system of the left lower  extremity including the femoral veins and iliac veins after overnight thrombolytic therapy.  Thrombolytic therapy had to be discontinued due to precipitous drop in fibrinogen level.  Patency of the veins was further improved today after mechanical thrombectomy and extensive venous angioplasty.  Popliteal vein access was removed upon completion of the procedure.   Original Report Authenticated By: Irish Lack, M.D.   Ir US Guide Vasc Access Left  03/26/2013   *RADIOLOGY REPORT*  IR LEFT LOWER EXTREMITY VENOGRAM; IR VENOGRAM IVC; IR INFUSION OF VENOUS THROMBOLYTIC INITIAL  Date:  03/26/2013  Clinical History: 47 year old male with a 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  He has a history of prior vena thromboembolism with a successful bilateral lower extremity lysis procedure done in May 2013.  He has been on Lovenox therapy which has been successful for until recently when he was incarcerated and missed 5 days and is therapeutic regimen.  He began experiencing progressive left lower extremity swelling shortly thereafter.  He now presents and extremity with massive left lower extremity swelling and extensive superficial and deep venous thrombus.  Procedures Performed: 1. Ultrasound-guided puncture of the left popliteal vein 2.  Dilated left lower extremity venogram 3.  Successful catheterization of the inferior vena cava 4.  Inferior vena cava gram 5.  Balloon occluded reflux of TPA into the proximal calf veins 6.  Placement of multi side-hole infusion catheter initiation of venous thrombolysis  Interventional Radiologist:  Sterling Big, MD  Sedation: Moderate (conscious) sedation was used.  Six mg Versed, 200 mcg Fentanyl were administered intravenously.  The patient's vital signs were monitored continuously by radiology nursing throughout the procedure.  Sedation Time: 15 minutes  Fluoroscopy time: 16 minutes  Contrast volume: 40 ml Omnipaque-300  Additional medications:  4 mg  TPA administered intravenously  PROCEDURE/FINDINGS:   Informed consent was obtained from the patient following explanation of the procedure, risks, benefits and alternatives. The patient understands, agrees and consents for the procedure. All questions were addressed. A time out was performed.  Maximal barrier sterile technique utilized including caps, mask, sterile gowns, sterile gloves, large sterile drape, hand hygiene, and betadine skin prep.  The left popliteal fossa was interrogated with ultrasound.  The popliteal vein is thrombosed.  Local anesthesia was attained by infiltration of 1% lidocaine.  Under direct sonographic guidance, the vessels punctured with a 21-gauge micropuncture needle.  An image was obtained and stored for the medical record.  A Benson wire was advanced into the right femoral vein and a 7- [place] vascular sheath advanced over the wire.  Through the sheath, a limited left lower extremity venogram was performed.  There is extensive thrombus throughout the popliteal and femoral vein. Using a vertebral catheter and glide wire, the catheter was successfully navigated the common femoral vein.  A venogram demonstrates complete occlusion of the external and common iliac veins.  There is an extensive network of collateral vessels. Thrombus is also seen extending into the great saphenous vein. After an extensive manipulation, the catheter was finally navigated into the inferior vena cava.  An inferior vena cava gram confirms patency of the cava and IVC filter.  A wire was advanced into the inferior vena cava.  The sheath was pulled back to near the insertion point in the popliteal vein.  A 6 mm balloon was advanced into the popliteal vein above the knee and inflated.  A gentle hand injection of contrast material confirmed reflux into the proximal calf veins which are thrombosed.  A total of 4 mg of TPA was then slowly injected and refluxed into the proximal calf veins.  The sheath was re-advanced  into the more distal popliteal vein.  A 90 cm length, 50 cm infusion Unifuse infusion catheter was then advanced over the wire and positioned so that it covered the common, external, common femoral and the majority of the femoral veins.  The sheath and catheter was secured with a post silk suture and a large sterile bandage.  Thrombolysis was initiated at 0.5 mg TNK- tPA per hour.  IMPRESSION:  1.  Left lower extremity venogram demonstrates complete occlusion of the left common and external iliac veins with extensive thrombus from the common femoral vein into the proximal calf veins as well as within the great saphenous vein.  2.  Reflux of 4 mg of TPA into the proximal calf veins.  3.  Placement of a multi side-hole infusion catheter from the common iliac vein into the distal femoral vein.  4.  Initiation of venous thrombolysis.  The patient will return to interventional radiology in 12 - 18 hours for venogram and lysis check.  I expect the patient will acquire angioplasty, and likely stenting of the left common and external iliac veins.  Signed,  Sterling Big, MD [facility] Radiology   Original Report Authenticated By: Malachy Moan, M.D.   Ir Infusion Thrombol Venous Initial (ms)  03/26/2013   *RADIOLOGY REPORT*  IR LEFT LOWER EXTREMITY VENOGRAM; IR VENOGRAM IVC; IR INFUSION OF VENOUS THROMBOLYTIC INITIAL  Date: 03/26/2013  Clinical History: 48 year old male with a 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  He has a history of prior vena thromboembolism with a successful bilateral lower extremity lysis procedure done in May 2013.  He has been on Lovenox therapy which has been successful for until recently when he was incarcerated and missed 5 days and is therapeutic regimen.  He began experiencing progressive left lower extremity swelling shortly thereafter.  He now presents and extremity with massive left lower extremity swelling  and extensive superficial and deep venous thrombus.  Procedures Performed: 1. Ultrasound-guided puncture of the left popliteal vein 2.  Dilated left lower extremity venogram 3.  Successful catheterization of the inferior vena cava 4.  Inferior vena cava gram 5.  Balloon occluded reflux of TPA into the proximal calf veins 6.  Placement of multi side-hole infusion catheter initiation of venous thrombolysis  Interventional Radiologist:  Sterling Big, MD  Sedation: Moderate (conscious) sedation was used.  Six mg Versed, 200 mcg Fentanyl were administered intravenously.  The patient's vital signs were monitored continuously by radiology nursing throughout the procedure.  Sedation Time: 15 minutes  Fluoroscopy time: 16 minutes  Contrast volume: 40 ml Omnipaque-300  Additional medications:  4 mg TPA administered intravenously  PROCEDURE/FINDINGS:   Informed consent was obtained from the patient following explanation of the procedure, risks, benefits and alternatives. The patient understands, agrees and consents for the procedure. All questions were addressed. A time out was performed.  Maximal barrier sterile technique utilized including caps, mask, sterile gowns, sterile gloves, large sterile drape, hand hygiene, and betadine skin prep.  The left popliteal fossa was interrogated with ultrasound.  The popliteal vein is thrombosed.  Local anesthesia was attained by infiltration of 1% lidocaine.  Under direct sonographic guidance, the vessels punctured with a 21-gauge micropuncture needle.  An image was obtained and stored for the medical record.  A Benson wire was advanced into the right femoral vein and a 7- [place] vascular sheath advanced over the wire.  Through the sheath, a limited left lower extremity venogram was performed.  There is extensive thrombus throughout the popliteal and femoral vein. Using a vertebral catheter and glide wire, the catheter was successfully navigated the common femoral vein.  A  venogram demonstrates complete occlusion of the external and common iliac veins.  There is an extensive network of collateral vessels. Thrombus is also seen extending into the great saphenous vein. After an extensive manipulation, the catheter was finally navigated into the inferior vena  cava.  An inferior vena cava gram confirms patency of the cava and IVC filter.  A wire was advanced into the inferior vena cava.  The sheath was pulled back to near the insertion point in the popliteal vein.  A 6 mm balloon was advanced into the popliteal vein above the knee and inflated.  A gentle hand injection of contrast material confirmed reflux into the proximal calf veins which are thrombosed.  A total of 4 mg of TPA was then slowly injected and refluxed into the proximal calf veins.  The sheath was re-advanced into the more distal popliteal vein.  A 90 cm length, 50 cm infusion Unifuse infusion catheter was then advanced over the wire and positioned so that it covered the common, external, common femoral and the majority of the femoral veins.  The sheath and catheter was secured with a post silk suture and a large sterile bandage.  Thrombolysis was initiated at 0.5 mg TNK- tPA per hour.  IMPRESSION:  1.  Left lower extremity venogram demonstrates complete occlusion of the left common and external iliac veins with extensive thrombus from the common femoral vein into the proximal calf veins as well as within the great saphenous vein.  2.  Reflux of 4 mg of TPA into the proximal calf veins.  3.  Placement of a multi side-hole infusion catheter from the common iliac vein into the distal femoral vein.  4.  Initiation of venous thrombolysis.  The patient will return to interventional radiology in 12 - 18 hours for venogram and lysis check.  I expect the patient will acquire angioplasty, and likely stenting of the left common and external iliac veins.  Signed,  Sterling Big, MD [facility]  [facility]   Original Report Authenticated By: Malachy Moan, M.D.   Ir Rande Lawman F/u Eval Art/ven Final Day (ms)  03/27/2013   *RADIOLOGY REPORT*  Clinical Data: Extensive left lower extremity iliofemoral DVT and status post initiation of transcatheter thrombolytic therapy yesterday.  Tenecteplase infusion had to be discontinued earlier today due to precipitous drop in fibrinogen to dangerously low levels after overnight thrombolytic therapy.  1.  FOLLOW-UP VENOGRAPHY DURING VENOUS THROMBOLYSIS, FINAL DAY. 2.  MECHANICAL THROMBECTOMY OF THE FEMORAL AND ILIAC VEINS. 3.  BALLOON ANGIOPLASTY OF FEMORAL AND ILIAC VEINS.  Comparison:  Imaging on 03/26/2013.  Sedation: 3.0 mg IV Versed; 150 mcg IV Fentanyl.  Total Moderate Sedation Time: 50 minutes.  Contrast:  65 ml Omnipaque-300  Fluoroscopy Time: 7 minutes and 12 seconds.  Procedure:  The procedure, risks, benefits, and alternatives were explained to the patient.  Questions regarding the procedure were encouraged and answered.  The patient understands and consents to the procedure.  The left popliteal fossa sheath and infusion catheter were prepped with Betadine in a sterile fashion, and a sterile drape was applied covering the operative field.  A sterile gown and sterile gloves were used for the procedure.  Venography was initially performed through the infusion catheter followed by a sheath.  Infusion catheter was removed over a guidewire.  Mechanical thrombectomy was performed throughout the femoral and iliac veins with the AngioJet device.  Additional venography was performed through the sheath as well as the AngioJet catheter.  Balloon angioplasty was performed in the thigh at the level of the femoral vein as well as throughout the entire common femoral vein, external iliac vein and common iliac vein with a 7 mm x 4 cm Conquest balloon.  Additional venography was performed.  Additional balloon angioplasty was performed of the common femoral vein,  external iliac vein and common iliac vein with a 9 mm x 4 cm Mustang balloon.  Additional venography was performed.  The popliteal vein sheath and guidewire were then removed and hemostasis obtained with manual compression.  Complications: None  Findings: Patency of the native deep veins showed significant improvement compared to pre thrombolytic therapy venography. 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  After AngioJet mechanical thrombectomy, marginal improvement was noted in venous patency.  After balloon angioplasty, further improvement was noted at the level of the femoral vein of the thigh, the common femoral vein and external and common iliac veins. Further dilatation of the common femoral vein and iliac veins were performed with a larger balloon.  There is persistent diffuse stenosis present of the deep venous system.  However, stent placement was not felt necessary as it would require complete reconstruction of the iliac veins as well as the common femoral vein and likely necessitate crossing the inguinal ligament.  IMPRESSION: Improved patency of the deep venous system of the left lower extremity including the femoral veins and iliac veins after overnight thrombolytic therapy.  Thrombolytic therapy had to be discontinued due to precipitous drop in fibrinogen level.  Patency of the veins was further improved today after mechanical thrombectomy and extensive venous angioplasty.  Popliteal vein access was removed upon completion of the procedure.   Original Report Authenticated By: Irish Lack, M.D.    Assessment/Plan: LLE DVT s/p TNKase and mechanical angiojet and PTA of left femoral and iliac veins. Sheath site left popliteal no signs of bleeding, hematoma or infection. Patient is not a stent candidate as there are multiple sites and would  require crossing the inguinal ligament. Currently on IV heparin, D/c'd TNKase on 7/21 secondary to low (60) fibrinogen. Would recommend outpatient thigh high compression stockings and transition to oral anticoagulation.     LOS: 2 days    Cloretta Ned 03/28/2013 11:38 AM

## 2013-03-28 NOTE — Progress Notes (Signed)
ANTICOAGULATION CONSULT NOTE - Follow Up Consult  Pharmacy Consult for heparin Indication: DVT s/p TNKase  Labs:  Recent Labs  03/26/13 1654 03/26/13 2108  03/27/13 0510 03/27/13 1441 03/28/13 0030 03/28/13 0345 03/28/13 0957  HGB 12.0*  --   < > 11.4*  --  11.3* 11.4*  --   HCT 35.3*  --   < > 34.0*  --  33.1* 34.4*  --   PLT 467*  --   --  272  --  246 255  --   APTT 37  --   --   --   --   --   --   --   LABPROT 12.0 12.4  --   --   --   --   --   --   INR 0.90 0.94  --   --   --   --   --   --   HEPARINUNFRC  --   --   < > <0.10* <0.10* 0.18*  --  0.31  CREATININE 0.63  --   --  0.61  --   --  0.60  --   < > = values in this interval not displayed.  Assessment: 47yo male lupus anticoagulant positive and Hx DVT admitted with new DVT after being non-compliant with [facility] as outpatient.  He underwent thrombectomy.  Heparin drip 2000 uts/hr gives HL 0.31 at goal but low end goal and this is after a bolus and rate increase. I will increase drip slihtly to ensure maintenance of therapeutic HL. Goal of Therapy:  Heparin level 0.3-0.7 units/ml   Plan:  Increase Heparin drip 2200 uts/hr  Daily CBC and HL  Leota Sauers Pharm.D. CPP, BCPS Clinical Pharmacist [phone] 03/28/2013 1:00 PM

## 2013-03-28 NOTE — Clinical Social Work Note (Signed)
Clinical Social Work Department BRIEF PSYCHOSOCIAL ASSESSMENT 03/28/2013  Patient:  [name],[name]     Account Number:  [id number]     Admit date:  03/26/2013  Clinical Social Worker:  Hulan Fray  Date/Time:  03/28/2013 02:59 PM  Referred by:  Physician  Date Referred:  03/26/2013 Referred for  Homelessness   Other Referral:   Interview type:  Patient Other interview type:    PSYCHOSOCIAL DATA Living Status:  ALONE Admitted from facility:   Level of care:   Primary support name:  [name] Primary support relationship to patient:  FRIEND Degree of support available:   adequate (per patient, he is homeless too)    CURRENT CONCERNS Current Concerns  Other - See comment   Other Concerns:   Homeless    SOCIAL WORK ASSESSMENT / PLAN Clinical Social Worker received referral for patient being homeless. CSW introduced self and explained reason for visit. Patient reported that he has been homeless for "years." Patient reported that he has been to [facility] and [facility]. Patient discussed his experience there and was not satified with the treatment he received. CSW offered resources for list of shelters/IRC information and patient reported, "you can leave them here, but I'm not gonna do anything with them." CSW left resources for patient. Patient reported that [place] "more comfortable" there. CSW will sign off at this time.   Assessment/plan status:  Information/Referral to [facility] Other assessment/ plan:   Information/referral to community resources:   List of shelters/IRC information    PATIENT'S/FAMILY'S RESPONSE TO PLAN OF CARE: Patient reported that he has been to [facility] and [facility] previously and was not satified with the treatment there. Patient reported that he plans [place].

## 2013-03-28 NOTE — Progress Notes (Signed)
[facility] Progress Note [place] TEAM 1 - Stepdown/ICU [name] ZOX:[id number] DOB: [birth year] DOA: 03/26/2013 PCP: Default, Provider, MD  Brief narrative: 47 y.o. 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 He is on chronic Lovenox therapy, 90 mg twice a day for his prior history of DVT. He had a 5 day interruption in his Lovenox secondary to being incarcerated (released on 03/22/13). The patient stated he developed the redness, swelling and pain 4 days prior to his admission. He was evaluated in the ER but they could not do a Doppler that night and he was released and instructed to continue his usual dose of Lovenox (which he resumed after being released from jail) and to return in one week if he continues to have problems with swelling. Patient had massive swelling of his left lower extremity and explained he had to have lysis of a thrombus in the past. His first blood clot was approximately 4 years ago. He does not have a PCP, but sees Dr. Gaylyn Rong.   Assessment/Plan:  Phlegmasia cerulea dolens, left lower extremity Now s/p IR intervention with TPA and TNK infusion - returned to IR today for angioplasty and angiojet as TNK had to be stopped due to falling fibrinogen level - cont heparin for now - probable return to lovenox for eventual d/c home (consider discussing w/ Dr. 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 On IV heparin for now - requires lifelong anticoag   Mild hyponatremia F/u in AM - related to EtOH abuse   Alcohol abuse Detoxification with Ativan per CIWA protocol - supplement thiamine and folic acid - no florid withdrawal at this time - was in jail for 5 days prior to being admitted and received Librium while  there  Tobacco abuse  Hepatitis B  Code Status: FULL Family Communication: no family present at time of exam Disposition Plan: SDU  Consultants: IR  Procedures: 7/20 - Venous doppler L LE - extensive, acute, occlusive DVT noted throughout the left lower extremity 7/20 - LLE venogram and cavogram - reflux of TPA into proximal calf veins - initiation of venous lysis at 0.5 mg TNK/hr 7/21 - FOLLOW-UP VENOGRAPHY - MECHANICAL THROMBECTOMY OF THE FEMORAL AND ILIAC VEINS - BALLOON ANGIOPLASTY OF FEMORAL AND ILIAC VEINS  Antibiotics: none  DVT prophylaxis: IV heparin  HPI/Subjective: Pt is alert - continues to have pain in left leg- no anxiety/ tremors/ nausea.   Objective: Blood pressure 131/69, pulse 72, temperature 98.3 F (36.8 C), temperature source Oral, resp. rate 18, height 6\' 2"  (1.88 m), weight 86.2 kg (190 lb 0.6 oz), SpO2 95.00%.  Intake/Output Summary (Last 24 hours) at 03/28/13 1415 Last data filed at 03/28/13 1301  Gross per 24 hour  Intake 992.23 ml  Output   1976 ml  Net -983.77 ml   Exam: General: No acute respiratory distress Lungs: Clear to auscultation bilaterally without wheezes or crackles Cardiovascular: Regular rate and rhythm without murmur gallop or rub normal S1 and S2 Abdomen: Nontender, nondistended, soft, bowel sounds positive, no rebound, no ascites, no appreciable mass Extremities: No significant cyanosis, clubbing;  massive 3++ edema L LE w/ diffuse erythema - trace edema only R LE  Data Reviewed: Basic Metabolic Panel:  Recent Labs Lab 03/26/13 1654  03/27/13 0510 03/28/13 0345  NA 130* 132* 133*  K 3.9 3.9 3.7  CL 95* 100 100  CO2 23 24 24   GLUCOSE 86 109* 97  BUN 5* 8 8  CREATININE 0.63 0.61 0.60  CALCIUM 9.2 8.5 8.2*   Liver Function Tests:  Recent Labs Lab 03/26/13 1654 03/27/13 0510 03/28/13 0345  AST 21 19 19   ALT 16 15 11   ALKPHOS 68 62 60  BILITOT 0.2* 0.5 0.2*  PROT 7.5 6.4 5.6*  ALBUMIN 3.0* 2.7* 2.4*    CBC:  Recent Labs Lab 03/26/13 1654 03/27/13 0025 03/27/13 0510 03/28/13 0030 03/28/13 0345  WBC 9.8  --  9.6 9.1 9.3  NEUTROABS 6.8  --   --   --   --   HGB 12.0* 12.5* 11.4* 11.3* 11.4*  HCT 35.3* 36.8* 34.0* 33.1* 34.4*  MCV 97.2  --  98.6 98.2 98.3  PLT 467*  --  272 246 255    Recent Results (from the past 240 hour(s))  MRSA PCR SCREENING     Status: None   Collection Time    03/26/13  8:25 PM      Result Value Range Status   MRSA by PCR NEGATIVE  NEGATIVE Final   Comment:            The GeneXpert MRSA Assay (FDA     approved for NASAL specimens     only), is one component of a     comprehensive MRSA colonization     surveillance program. It is not     intended to diagnose MRSA     infection nor to guide or     monitor treatment for     MRSA infections.     Studies:  Recent x-ray studies have been reviewed in detail by the Attending Physician  Scheduled Meds:  Scheduled Meds: . alteplase  2 mg Intracatheter Once  . alteplase  2 mg Intracatheter Once  . folic acid  1 mg Oral Daily  . LORazepam  0-4 mg Intravenous Q6H   Followed by  . LORazepam  0-4 mg Intravenous Q12H  . multivitamin with minerals  1 tablet Oral Daily  . nicotine  21 mg Transdermal Daily  . sodium chloride  3 mL Intravenous Q12H  . thiamine  100 mg Oral Daily   Or  . thiamine  100 mg Intravenous Daily   Continuous Infusions: . sodium chloride 10 mL/hr at 03/28/13 0700  . heparin 2,200 Units/hr (03/28/13 1301)    Time spent on care of this patient:   Calvert Cantor, MD  [facility] Office  [phone] Pager - Text Page per Amion as per below:  On-Call/Text Page:      [email or web address]      password [email or web address]  If 7PM-7AM, please contact night-coverage [email or web address] Password [email or web address] 03/28/2013, 2:15 PM   LOS: 2 days

## 2013-03-29 LAB — CBC
MCH: 32.2 pg (ref 26.0–34.0)
MCHC: 32.4 g/dL (ref 30.0–36.0)
Platelets: 290 10*3/uL (ref 150–400)
RBC: 3.48 MIL/uL — ABNORMAL LOW (ref 4.22–5.81)
RDW: 14.1 % (ref 11.5–15.5)

## 2013-03-29 LAB — FIBRINOGEN: Fibrinogen: 373 mg/dL (ref 204–475)

## 2013-03-29 LAB — HEPARIN LEVEL (UNFRACTIONATED): Heparin Unfractionated: 0.72 IU/mL — ABNORMAL HIGH (ref 0.30–0.70)

## 2013-03-29 MED ORDER — ENOXAPARIN SODIUM 100 MG/ML ~~LOC~~ SOLN
90.0000 mg | Freq: Two times a day (BID) | SUBCUTANEOUS | Status: DC
  Administered 2013-03-29 – 2013-03-30 (×2): 90 mg via SUBCUTANEOUS
  Filled 2013-03-29 (×5): qty 1

## 2013-03-29 MED ORDER — HEPARIN (PORCINE) IN NACL 100-0.45 UNIT/ML-% IJ SOLN
2100.0000 [IU]/h | INTRAMUSCULAR | Status: DC
  Administered 2013-03-29: 2100 [IU]/h via INTRAVENOUS
  Filled 2013-03-29: qty 250

## 2013-03-29 NOTE — Progress Notes (Addendum)
Subjective: Pt without sig changes; still with sig pain, swelling LLE  Objective: Vital signs in last 24 hours: Temp:  [98.1 F (36.7 C)-99.2 F (37.3 C)] 98.5 F (36.9 C) (07/23 0354) Pulse Rate:  [68-79] 68 (07/23 0600) Resp:  [15-20] 15 (07/23 0833) BP: (108-131)/(54-74) 108/65 mmHg (07/23 0833) SpO2:  [92 %-98 %] 92 % (07/23 0833) Last BM Date: 03/27/13  Intake/Output from previous day: 07/22 0701 - 07/23 0700 In: 1933.5 [P.O.:660; I.V.:1273.5] Out: 1975 [Urine:1975] Intake/Output this shift: Total I/O In: 575 [P.O.:480; I.V.:95] Out: 300 [Urine:300]  LLE with 3+ edema , erythema, diffusely tender to palpation, sens/motor fxn intact; puncture site left popliteal space ok with no hematoma  Lab Results:   Recent Labs  03/28/13 0345 03/29/13 0635  WBC 9.3 11.0*  HGB 11.4* 11.2*  HCT 34.4* 34.6*  PLT 255 290   BMET  Recent Labs  03/27/13 0510 03/28/13 0345  NA 132* 133*  K 3.9 3.7  CL 100 100  CO2 24 24  GLUCOSE 109* 97  BUN 8 8  CREATININE 0.61 0.60  CALCIUM 8.5 8.2*   PT/INR  Recent Labs  03/26/13 1654 03/26/13 2108  LABPROT 12.0 12.4  INR 0.90 0.94   ABG No results found for this basename: PHART, PCO2, PO2, HCO3,  in the last 72 hours  Studies/Results: Ir Pta Venous Left  03/27/2013   *RADIOLOGY REPORT*  Clinical Data: Extensive left lower extremity iliofemoral DVT and status post initiation of transcatheter thrombolytic therapy yesterday.  Tenecteplase infusion had to be discontinued earlier today due to precipitous drop in fibrinogen to dangerously low levels after overnight thrombolytic therapy.  1.  FOLLOW-UP VENOGRAPHY DURING VENOUS THROMBOLYSIS, FINAL DAY. 2.  MECHANICAL THROMBECTOMY OF THE FEMORAL AND ILIAC VEINS. 3.  BALLOON ANGIOPLASTY OF FEMORAL AND ILIAC VEINS.  Comparison:  Imaging on 03/26/2013.  Sedation: 3.0 mg IV Versed; 150 mcg IV Fentanyl.  Total Moderate Sedation Time: 50 minutes.  Contrast:  65 ml Omnipaque-300  Fluoroscopy  Time: 7 minutes and 12 seconds.  Procedure:  The procedure, risks, benefits, and alternatives were explained to the patient.  Questions regarding the procedure were encouraged and answered.  The patient understands and consents to the procedure.  The left popliteal fossa sheath and infusion catheter were prepped with Betadine in a sterile fashion, and a sterile drape was applied covering the operative field.  A sterile gown and sterile gloves were used for the procedure.  Venography was initially performed through the infusion catheter followed by a sheath.  Infusion catheter was removed over a guidewire.  Mechanical thrombectomy was performed throughout the femoral and iliac veins with the AngioJet device.  Additional venography was performed through the sheath as well as the AngioJet catheter.  Balloon angioplasty was performed in the thigh at the level of the femoral vein as well as throughout the entire common femoral vein, external iliac vein and common iliac vein with a 7 mm x 4 cm Conquest balloon.  Additional venography was performed.  Additional balloon angioplasty was performed of the common femoral vein, external iliac vein and common iliac vein with a 9 mm x 4 cm Mustang balloon.  Additional venography was performed.  The popliteal vein sheath and guidewire were then removed and hemostasis obtained with manual compression.  Complications: None  Findings: Patency of the native deep veins showed significant improvement compared to pre thrombolytic therapy venography. Continuous in-line flow was present throughout the femoral and iliac veins with some persistent filling of collateral veins as  well as a diffusely stenotic venous channel due to chronic thrombus.  After AngioJet mechanical thrombectomy, marginal improvement was noted in venous patency.  After balloon angioplasty, further improvement was noted at the level of the femoral vein of the thigh, the common femoral vein and external and common iliac  veins. Further dilatation of the common femoral vein and iliac veins were performed with a larger balloon.  There is persistent diffuse stenosis present of the deep venous system.  However, stent placement was not felt necessary as it would require complete reconstruction of the iliac veins as well as the common femoral vein and likely necessitate crossing the inguinal ligament.  IMPRESSION: Improved patency of the deep venous system of the left lower extremity including the femoral veins and iliac veins after overnight thrombolytic therapy.  Thrombolytic therapy had to be discontinued due to precipitous drop in fibrinogen level.  Patency of the veins was further improved today after mechanical thrombectomy and extensive venous angioplasty.  Popliteal vein access was removed upon completion of the procedure.   Original Report Authenticated By: Irish Lack, M.D.   Ir Thrombect Veno Mech Mod Sed  03/27/2013   *RADIOLOGY REPORT*  Clinical Data: Extensive left lower extremity iliofemoral DVT and status post initiation of transcatheter thrombolytic therapy yesterday.  Tenecteplase infusion had to be discontinued earlier today due to precipitous drop in fibrinogen to dangerously low levels after overnight thrombolytic therapy.  1.  FOLLOW-UP VENOGRAPHY DURING VENOUS THROMBOLYSIS, FINAL DAY. 2.  MECHANICAL THROMBECTOMY OF THE FEMORAL AND ILIAC VEINS. 3.  BALLOON ANGIOPLASTY OF FEMORAL AND ILIAC VEINS.  Comparison:  Imaging on 03/26/2013.  Sedation: 3.0 mg IV Versed; 150 mcg IV Fentanyl.  Total Moderate Sedation Time: 50 minutes.  Contrast:  65 ml Omnipaque-300  Fluoroscopy Time: 7 minutes and 12 seconds.  Procedure:  The procedure, risks, benefits, and alternatives were explained to the patient.  Questions regarding the procedure were encouraged and answered.  The patient understands and consents to the procedure.  The left popliteal fossa sheath and infusion catheter were prepped with Betadine in a sterile fashion,  and a sterile drape was applied covering the operative field.  A sterile gown and sterile gloves were used for the procedure.  Venography was initially performed through the infusion catheter followed by a sheath.  Infusion catheter was removed over a guidewire.  Mechanical thrombectomy was performed throughout the femoral and iliac veins with the AngioJet device.  Additional venography was performed through the sheath as well as the AngioJet catheter.  Balloon angioplasty was performed in the thigh at the level of the femoral vein as well as throughout the entire common femoral vein, external iliac vein and common iliac vein with a 7 mm x 4 cm Conquest balloon.  Additional venography was performed.  Additional balloon angioplasty was performed of the common femoral vein, external iliac vein and common iliac vein with a 9 mm x 4 cm Mustang balloon.  Additional venography was performed.  The popliteal vein sheath and guidewire were then removed and hemostasis obtained with manual compression.  Complications: None  Findings: Patency of the native deep veins showed significant improvement compared to pre thrombolytic therapy venography. 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  After AngioJet mechanical thrombectomy, marginal improvement was noted in venous patency.  After balloon angioplasty, further improvement was noted at the level of the femoral vein  of the thigh, the common femoral vein and external and common iliac veins. Further dilatation of the common femoral vein and iliac veins were performed with a larger balloon.  There is persistent diffuse stenosis present of the deep venous system.  However, stent placement was not felt necessary as it would require complete reconstruction of the iliac veins as well as the common femoral vein and likely necessitate  crossing the inguinal ligament.  IMPRESSION: Improved patency of the deep venous system of the left lower extremity including the femoral veins and iliac veins after overnight thrombolytic therapy.  Thrombolytic therapy had to be discontinued due to precipitous drop in fibrinogen level.  Patency of the veins was further improved today after mechanical thrombectomy and extensive venous angioplasty.  Popliteal vein access was removed upon completion of the procedure.   Original Report Authenticated By: Irish Lack, M.D.   Ir Rande Lawman F/u Eval Art/ven Final Day (ms)  03/27/2013   *RADIOLOGY REPORT*  Clinical Data: Extensive left lower extremity iliofemoral DVT and status post initiation of transcatheter thrombolytic therapy yesterday.  Tenecteplase infusion had to be discontinued earlier today due to precipitous drop in fibrinogen to dangerously low levels after overnight thrombolytic therapy.  1.  FOLLOW-UP VENOGRAPHY DURING VENOUS THROMBOLYSIS, FINAL DAY. 2.  MECHANICAL THROMBECTOMY OF THE FEMORAL AND ILIAC VEINS. 3.  BALLOON ANGIOPLASTY OF FEMORAL AND ILIAC VEINS.  Comparison:  Imaging on 03/26/2013.  Sedation: 3.0 mg IV Versed; 150 mcg IV Fentanyl.  Total Moderate Sedation Time: 50 minutes.  Contrast:  65 ml Omnipaque-300  Fluoroscopy Time: 7 minutes and 12 seconds.  Procedure:  The procedure, risks, benefits, and alternatives were explained to the patient.  Questions regarding the procedure were encouraged and answered.  The patient understands and consents to the procedure.  The left popliteal fossa sheath and infusion catheter were prepped with Betadine in a sterile fashion, and a sterile drape was applied covering the operative field.  A sterile gown and sterile gloves were used for the procedure.  Venography was initially performed through the infusion catheter followed by a sheath.  Infusion catheter was removed over a guidewire.  Mechanical thrombectomy was performed throughout the femoral and iliac  veins with the AngioJet device.  Additional venography was performed through the sheath as well as the AngioJet catheter.  Balloon angioplasty was performed in the thigh at the level of the femoral vein as well as throughout the entire common femoral vein, external iliac vein and common iliac vein with a 7 mm x 4 cm Conquest balloon.  Additional venography was performed.  Additional balloon angioplasty was performed of the common femoral vein, external iliac vein and common iliac vein with a 9 mm x 4 cm Mustang balloon.  Additional venography was performed.  The popliteal vein sheath and guidewire were then removed and hemostasis obtained with manual compression.  Complications: None  Findings: Patency of the native deep veins showed significant improvement compared to pre thrombolytic therapy venography. 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  After AngioJet mechanical thrombectomy, marginal improvement was noted in venous patency.  After balloon angioplasty, further improvement was noted at the level of the femoral vein of the thigh, the common femoral vein and external and common iliac veins. Further dilatation of the common femoral vein and iliac veins were performed with a larger balloon.  There is persistent diffuse stenosis present  of the deep venous system.  However, stent placement was not felt necessary as it would require complete reconstruction of the iliac veins as well as the common femoral vein and likely necessitate crossing the inguinal ligament.  IMPRESSION: Improved patency of the deep venous system of the left lower extremity including the femoral veins and iliac veins after overnight thrombolytic therapy.  Thrombolytic therapy had to be discontinued due to precipitous drop in fibrinogen level.  Patency of the veins was further improved today  after mechanical thrombectomy and extensive venous angioplasty.  Popliteal vein access was removed upon completion of the procedure.   Original Report Authenticated By: Irish Lack, M.D.    Anti-infectives: Anti-infectives   None      Assessment/Plan: s/p completion of thrombolytic therapy/angioplasty/thrombectomy for extensive LLE iliofemoral DVT 7/21; procedure halted early secondary to very low fibrinogen levels; cont IV heparin with eventual transition to oral anticoagulation; keep LLE elevated;  thigh high compression stocking ; ?short course of IV antibiotic tx for superficial cellulitis in view of rising WBC        LOS: 3 days    Kalimah Capurro,D [facility] 03/29/2013

## 2013-03-29 NOTE — Progress Notes (Signed)
[facility] Progress Note [place] TEAM 1 - Stepdown/ICU Franz Svec HYQ:[id number] DOB: May 17, 1966 DOA: 03/26/2013 PCP: Default, Provider, MD  Brief narrative: 47 y.o. 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 He is supposed to be on chronic Lovenox therapy, 90 mg twice a day for his prior history of DVT. He had a 5 day interruption in his Lovenox secondary to being incarcerated (released on 03/22/13). The patient stated he developed the redness, swelling and pain 4 days prior to his admission. He was evaluated in the ER but they could not do a Doppler that night and he was released and instructed to continue his usual dose of Lovenox (which he resumed after being released from jail) and to return in one week if he continues to have problems with swelling. Patient had massive swelling of his left lower extremity and explained he had to have lysis of a thrombus in the past. His first blood clot was approximately 4 years ago. He does not have a PCP, but sees Dr. Gaylyn Rong.   Assessment/Plan:  Phlegmasia cerulea dolens, left lower extremity Now s/p IR intervention with TPA and TNK infusion - returned to IR for angioplasty and angiojet as TNK had to be stopped due to falling fibrinogen level - transition back to lovenox tody - review of office visits with Dr. Gaylyn Rong reveals that compliance was an issue, and that pt is not felt to have "failed" prior tx attempts with coumadin or Xarelto, but rather to have been noncompliant - we will need to assess his ability to obtain lovenox prior to d/c - if this is not possible, we will have little choice but to switch him back to coumadin (as was Dr. 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  s/p IVC filter On IV heparin to be transitioned to lovenox as per discussion above  - requires lifelong anticoag   Mild hyponatremia Slowly improving - related to EtOH abuse   Alcohol abuse Detoxification with Ativan per [facility] protocol - supplement thiamine and folic acid - no florid withdrawal at this time   Tobacco abuse Counseled on need to d/c tobacco   Hepatitis B LFTs are normal at this time  Code Status: FULL Family Communication: no family present at time of exam Disposition Plan: transfer to tele bed  Consultants: IR  Procedures: 7/20 - Venous doppler L LE - extensive, acute, occlusive DVT noted throughout the left lower extremity 7/20 - LLE venogram and cavogram - reflux of TPA into proximal calf veins - initiation of venous lysis at 0.5 mg TNK/hr 7/21 - FOLLOW-UP VENOGRAPHY - MECHANICAL THROMBECTOMY OF THE FEMORAL AND ILIAC VEINS - BALLOON ANGIOPLASTY OF FEMORAL AND ILIAC VEINS  Antibiotics: none  DVT prophylaxis: IV heparin >> lovenox  HPI/Subjective: Pt c/o some modest pain in L leg.  He feels the leg is "a little less swollen" and significantly less red.  He denies cp or sob. No hematochezia or melena.  Objective: Blood pressure 119/66, pulse 70, temperature 99.3 F (37.4 C), temperature source Oral, resp. rate 15, height 6\' 2"  (1.88 m), weight 86.2 kg (190 lb 0.6 oz), SpO2 97.00%.  Intake/Output Summary (Last 24 hours) at 03/29/13 1436 Last data filed at 03/29/13 1300  Gross per 24  hour  Intake 2143.58 ml  Output   1625 ml  Net 518.58 ml   Exam: General: No acute respiratory distress Lungs: Clear to auscultation bilaterally without wheezes or crackles Cardiovascular: Regular rate and rhythm without murmur gallop or rub normal S1 and S2 Abdomen: Nontender, nondistended, soft, bowel sounds positive, no rebound, no ascites, no appreciable mass Extremities: No significant cyanosis, clubbing;  3++ edema L LE w/ less severe erythema - trace edema only R  LE  Data Reviewed: Basic Metabolic Panel:  Recent Labs Lab 03/26/13 1654 03/27/13 0510 03/28/13 0345  NA 130* 132* 133*  K 3.9 3.9 3.7  CL 95* 100 100  CO2 23 24 24   GLUCOSE 86 109* 97  BUN 5* 8 8  CREATININE 0.63 0.61 0.60  CALCIUM 9.2 8.5 8.2*   Liver Function Tests:  Recent Labs Lab 03/26/13 1654 03/27/13 0510 03/28/13 0345  AST 21 19 19   ALT 16 15 11   ALKPHOS 68 62 60  BILITOT 0.2* 0.5 0.2*  PROT 7.5 6.4 5.6*  ALBUMIN 3.0* 2.7* 2.4*   CBC:  Recent Labs Lab 03/26/13 1654 03/27/13 0025 03/27/13 0510 03/28/13 0030 03/28/13 0345 03/29/13 0635  WBC 9.8  --  9.6 9.1 9.3 11.0*  NEUTROABS 6.8  --   --   --   --   --   HGB 12.0* 12.5* 11.4* 11.3* 11.4* 11.2*  HCT 35.3* 36.8* 34.0* 33.1* 34.4* 34.6*  MCV 97.2  --  98.6 98.2 98.3 99.4  PLT 467*  --  272 246 255 290    Recent Results (from the past 240 hour(s))  MRSA PCR SCREENING     Status: None   Collection Time    03/26/13  8:25 PM      Result Value Range Status   MRSA by PCR NEGATIVE  NEGATIVE Final   Comment:            The GeneXpert MRSA Assay (FDA     approved for NASAL specimens     only), is one component of a     comprehensive MRSA colonization     surveillance program. It is not     intended to diagnose MRSA     infection nor to guide or     monitor treatment for     MRSA infections.     Studies:  Recent x-ray studies have been reviewed in detail by the Attending Physician  Scheduled Meds:  Scheduled Meds: . alteplase  2 mg Intracatheter Once  . alteplase  2 mg Intracatheter Once  . folic acid  1 mg Oral Daily  . LORazepam  0-4 mg Intravenous Q12H  . multivitamin with minerals  1 tablet Oral Daily  . nicotine  21 mg Transdermal Daily  . sodium chloride  3 mL Intravenous Q12H  . thiamine  100 mg Oral Daily   Or  . thiamine  100 mg Intravenous Daily   Continuous Infusions: . sodium chloride 10 mL/hr at 03/28/13 0700  . heparin 2,100 Units/hr (03/29/13 1300)    Time spent on  care of this patient:   [facility] T  [facility] Office  [phone] Pager - Text Page per Loretha Stapler as per below:  On-Call/Text Page:      [email or web address]      password [email or web address]  If 7PM-7AM, please contact night-coverage [email or web address] Password [facility] 03/29/2013, 2:36 PM   LOS: 3 days

## 2013-03-29 NOTE — Progress Notes (Addendum)
ANTICOAGULATION CONSULT NOTE - Follow Up Consult  Pharmacy Consult for Heparin-->Lovenox Indication: DVT s/p TNKase  No Known Allergies  Patient Measurements: Height: 6\' 2"  (188 cm) Weight: 190 lb 0.6 oz (86.2 kg) IBW/kg (Calculated) : 82.2 Heparin Dosing Weight: 86kg  Vital Signs: Temp: 98.5 F (36.9 C) (07/23 0354) Temp src: Oral (07/23 0354) BP: 131/74 mmHg (07/23 0600) Pulse Rate: 68 (07/23 0600)  Labs:  Recent Labs  03/26/13 1654 03/26/13 2108  03/27/13 0510  03/28/13 0030 03/28/13 0345 03/28/13 0957 03/29/13 0635  HGB 12.0*  --   < > 11.4*  --  11.3* 11.4*  --  11.2*  HCT 35.3*  --   < > 34.0*  --  33.1* 34.4*  --  34.6*  PLT 467*  --   --  272  --  246 255  --  290  APTT 37  --   --   --   --   --   --   --   --   LABPROT 12.0 12.4  --   --   --   --   --   --   --   INR 0.90 0.94  --   --   --   --   --   --   --   HEPARINUNFRC  --   --   --  <0.10*  < > 0.18*  --  0.31 0.72*  CREATININE 0.63  --   --  0.61  --   --  0.60  --   --   < > = values in this interval not displayed.  Estimated Creatinine Clearance: 132.7 ml/min (by C-G formula based on Cr of 0.6).   Medications:  Heparin @ 2200 units/hr  Assessment: 47yo male lupus anticoagulant positive and Hx DVT admitted with new LLE DVT (7/20) after being non-compliant with [facility] as outpatient. He is s/p lysis on 7/20 and s/p thrombectomy/balloon angioplasty of the femoral and iliac veins on 7/21. TNK was d/c'ed 7/21 due to fibrinogen < 60 x2, however, he continues on heparin. Heparin level today is slightly above goal. CBC is stable. No bleeding reported.  Goal of Therapy:  Heparin level 0.3-0.7 units/ml Monitor platelets by anticoagulation protocol: Yes   Plan:  1) Decrease heparin to 2100 units/hr 2) Follow up heparin level and CBC in AM 3) Follow up transition to oral anticoagulation  Fredrik Rigger 03/29/2013,8:13 AM   Pharmacy has received orders to transition patient from IV heparin to SQ  lovenox. He was supposed to be on lovenox 90mg  SQ q12 PTA. Given CrCl > 13ml/min and weight = 86.2kg, will use 1mg /kg q12 dosing.  Plan: 1) Stop heparin drip 2) ~1 hour after heparin drip is off, start lovenox 90mg  sq q12 3) CBC q72h  Fredrik Rigger 03/29/2013, 3:27 PM

## 2013-03-30 DIAGNOSIS — D649 Anemia, unspecified: Secondary | ICD-10-CM

## 2013-03-30 LAB — BASIC METABOLIC PANEL
BUN: 7 mg/dL (ref 6–23)
Calcium: 8.8 mg/dL (ref 8.4–10.5)
Creatinine, Ser: 0.59 mg/dL (ref 0.50–1.35)
GFR calc Af Amer: >90 mL/min (ref >90)
GFR calc non Af Amer: >90 mL/min (ref >90)
Glucose, Bld: 91 mg/dL (ref 70–99)

## 2013-03-30 MED ORDER — RIVAROXABAN 15 MG PO TABS
15.0000 mg | ORAL_TABLET | Freq: Two times a day (BID) | ORAL | Status: DC
  Administered 2013-03-30 – 2013-04-06 (×14): 15 mg via ORAL
  Filled 2013-03-30 (×16): qty 1

## 2013-03-30 NOTE — Evaluation (Signed)
Physical Therapy Evaluation Patient Details Name: [name] MRN: [id number] DOB: Sep 30, 1965 Today's Date: 03/30/2013 Time: 8295-6213 PT Time Calculation (min): 22 min  PT Assessment / Plan / Recommendation History of Present Illness  47 y.o. 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  Clinical Impression  *Pt ambulated 62' with RW with heavy use of BUEs to unweight LLE 2* pain. Would benefit from a RW upon DC. PT will follow to address decreased mobility, decreased L ankle ROM, pain, edema and to maximize functional independence. **    PT Assessment  Patient needs continued PT services    Follow Up Recommendations  Outpatient PT    Does the patient have the potential to tolerate intense rehabilitation      Barriers to Discharge        Equipment Recommendations  Rolling walker with 5" wheels    Recommendations for Other Services     Frequency Min 3X/week    Precautions / Restrictions Precautions Precautions: Fall Precaution Comments: pt reports h/o lots of falls   Pertinent Vitals/Pain **8/10 L ankle with walking 5/10 at rest Elevated BLEs*      Mobility  Bed Mobility Bed Mobility: Supine to Sit Supine to Sit: 6: Modified independent (Device/Increase time);With rails;HOB elevated Transfers Transfers: Sit to Stand;Stand to Sit Sit to Stand: 5: Supervision;From bed Stand to Sit: 5: Supervision;To bed Details for Transfer Assistance: VCs for hand placement Ambulation/Gait Ambulation/Gait Assistance: 5: Supervision Ambulation Distance (Feet): 50 Feet Assistive device: Rolling walker Ambulation/Gait Assistance Details: heavy use of UEs to unweight LLE 2* pain with WB Gait Pattern: Step-to pattern;Trunk flexed;Antalgic;Decreased  dorsiflexion - left;Decreased weight shift to left    Exercises General Exercises - Lower Extremity Ankle Circles/Pumps: AROM;Left;5 reps   PT Diagnosis: Difficulty walking;Acute pain  PT Problem List: Decreased range of motion;Pain;Decreased mobility PT Treatment Interventions: DME instruction;Gait training;Patient/family education;Therapeutic exercise     PT Goals(Current goals can be found in the care plan section) Acute Rehab PT Goals Patient Stated Goal: to be able to put weight on LLE, decrease pain PT Goal Formulation: With patient Time For Goal Achievement: 04/13/13  Visit Information  Last PT Received On: 03/30/13 Assistance Needed: +1 History of Present Illness: 47 y.o. 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       Prior Functioning  Home Living Family/patient expects to be discharged to:: Shelter/Homeless Additional Comments: lives in tent Prior Function Level of Independence: Independent Communication Communication: No difficulties    Cognition  Cognition Arousal/Alertness: Awake/alert Behavior During Therapy: WFL for tasks assessed/performed Overall Cognitive Status: Within Functional Limits for tasks assessed    Extremity/Trunk Assessment Upper Extremity Assessment Upper Extremity Assessment: Overall WFL for tasks assessed Lower Extremity Assessment Lower Extremity Assessment: LLE deficits/detail LLE Deficits / Details: L ankle DF/PF decreased 75% due to pain/edema LLE: Unable to fully assess due to pain Cervical / Trunk Assessment Cervical / Trunk Assessment: Normal   Balance Balance Balance Assessed: Yes Static Sitting Balance Static Sitting - Balance Support: Bilateral upper extremity supported;Feet supported Static  Sitting - Level of Assistance: 7: Independent Static Sitting -  Comment/# of Minutes: 2  End of Session PT - End of Session Activity Tolerance: Patient limited by pain Patient left: in bed;with call bell/phone within reach Nurse Communication: Mobility status  GP     [name] 03/30/2013, 1:43 PM [phone]

## 2013-03-30 NOTE — Consult Note (Signed)
ANTICOAGULATION CONSULT NOTE - Initial Consult  Pharmacy Consult:  Xarelto Indication: Recurrent DVT with + Lupus Anticoagulant  Dosing weight 86 kg  Active Problems: Principal Problem:   Phlegmasia cerulea dolens, left lower extremity Active Problems:   Alcohol abuse   Tobacco abuse   Lupus anticoagulant disorder   Patient is Homeless   History of non-compliance   Labs:  Recent Labs  03/28/13 0030 03/28/13 0345 03/28/13 0957 03/29/13 0635 03/30/13 0430  HGB 11.3* 11.4*  --  11.2*  --   HCT 33.1* 34.4*  --  34.6*  --   PLT 246 255  --  290  --   HEPARINUNFRC 0.18*  --  0.31 0.72* <0.10*  CREATININE  --  0.60  --   --  0.59   Lab Results  Component Value Date   INR 0.94 03/26/2013   Estimated Creatinine Clearance: 132.7 ml/min (by C-G formula based on Cr of 0.59).  Medical / Surgical History: Past Medical History  Diagnosis Date  . Coronary artery disease   . Degenerative joint disease of spine   . Pulmonary embolism 06/2010  . Degenerative joint disease   . 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 ETOH abuse   . DVT (deep venous thrombosis)   . Hepatitis B   . Mental disorder   . Depression    Past Surgical History  Procedure Laterality Date  . Left elbow surgery    . Tonsillectomy      Medications:   Scheduled:  . folic acid  1 mg Oral Daily  . LORazepam  0-4 mg Intravenous Q12H  . multivitamin with minerals  1 tablet Oral Daily  . nicotine  21 mg Transdermal Daily  . sodium chloride  3 mL Intravenous Q12H  . thiamine  100 mg Oral Daily   Assessment:  Homeless 46 y.o.male with recurrent DVT, + Lupus anticoagulant, IVC Filter, and history of medication non-commpliance  who is to be placed on Xarelto in preparation for hospital discharge.  The patient is unlikely to give himself shots as [place]  He has no access to refrigeration or temperature control for the  Lovenox syringes for neither Summer nor Winter temperature  extremes  Xarelto is on the preferred [employer]..  Goal of Therapy:   Anticoagulation for life-long treatment in this gentleman with recurrent VTE and a hypercoagulable state.      Plan:   Begin Xarelto 15 mg po BID x 21 days, first dose tonight, then 20 mg po daily with a meal.  Xarelto discharge education to insure patient understanding of the importance of compliance.  Meriem Lemieux, Elisha Headland,  Pharm.D.. 03/30/2013,  2:13 PM

## 2013-03-30 NOTE — Progress Notes (Signed)
OT Cancellation Note  Patient Details Name: Shervin Cypert MRN: [id number] DOB: 18-May-1966   Cancelled Treatment:    Reason Eval/Treat Not Completed: Fatigue/lethargy limiting ability to participate. Pt declining OT session at this time stating that he is tired and had just finished with PT.  Will re-attempt tomorrow.   03/30/2013 Cipriano Mile OTR/L Pager [phone] Office [phone]

## 2013-03-30 NOTE — Progress Notes (Signed)
PT Cancellation Note  Patient Details Name: [name] MRN: [id number] DOB: [birth year]   Cancelled Treatment:    Reason Eval/Treat Not Completed: Patient declined, no reason specified ("I don't feel up to it". ) Pt stated he knew the Dr wants him to get up and walk but he needs a nap first due to sleeping poorly last night. Will re-attempt later today.    [name] 03/30/2013, 8:43 AM [phone]

## 2013-03-30 NOTE — Progress Notes (Signed)
[facility] Progress Note    [name] ZOX:[id number] DOB: 1966-03-09 DOA: 03/26/2013 PCP: Default, Provider, MD  Brief narrative: 47 y.o. 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 He is supposed to be on chronic Lovenox therapy, 90 mg twice a day for his prior history of DVT. He had a 5 day interruption in his Lovenox secondary to being incarcerated (released on 03/22/13). The patient stated he developed the redness, swelling and pain 4 days prior to his admission. He was evaluated in the ER but they could not do a Doppler that night and he was released and instructed to continue his usual dose of Lovenox (which he resumed after being released from jail) and to return in one week if he continues to have problems with swelling. Patient had massive swelling of his left lower extremity and explained he had to have lysis of a thrombus in the past. His first blood clot was approximately 4 years ago. He does not have a PCP, but sees Dr. Gaylyn [name].   Assessment/Plan:  Phlegmasia cerulea dolens, left lower extremity Now s/p IR intervention with TPA and TNK infusion - returned to IR for angioplasty and angiojet as TNK had to be stopped due to falling fibrinogen level - transition back to lovenox tody - review of office visits with Dr. Gaylyn [name] reveals that compliance was an issue, and that pt is not felt to have "failed" prior tx attempts with coumadin or Xarelto, but rather to have been noncompliant - discussed in detail about compliance, he reported that he did not have medicaid previously, hence has been missing medications. Now he has medicaid and will be able to obtain meds and will be compliant. Agreed to be on xarelto rather than lovenox injections.   Lupus anticoagulant disorder  w/ prior hx of DVT/PE s/p IVC filter On IV heparin to be transitioned to  xarelto  - requires lifelong anticoag   Mild hyponatremia Slowly improving - related to EtOH abuse   Alcohol abuse Detoxification with Ativan per CIWA protocol - supplement thiamine and folic acid - no florid withdrawal at this time   Tobacco abuse Counseled on need to d/c tobacco   Hepatitis B LFTs are normal at this time  Code Status: FULL Family Communication: no family present at time of exam Disposition Plan: transfer to tele bed  Consultants: IR  Procedures: 7/20 - Venous doppler L LE - extensive, acute, occlusive DVT noted throughout the left lower extremity 7/20 - LLE venogram and cavogram - reflux of TPA into proximal calf veins - initiation of venous lysis at 0.5 mg TNK/hr 7/21 - FOLLOW-UP VENOGRAPHY - MECHANICAL THROMBECTOMY OF THE FEMORAL AND ILIAC VEINS - BALLOON ANGIOPLASTY OF FEMORAL AND ILIAC VEINS  Antibiotics: none  DVT prophylaxis: IV heparin >> lovenox >. xarelto  HPI/Subjective: Pt c/o some modest pain in L leg.  He feels the leg is "a little less swollen" and significantly less red.  He denies cp or sob. No hematochezia or melena.  Objective: Blood pressure 116/67, pulse 74, temperature 98.1 F (36.7 C), temperature source Oral, resp. rate 18, height 6\' 2"  (1.88 m), weight 86.2 kg (190 lb 0.6 oz), SpO2 99.00%.  Intake/Output Summary (Last 24 hours) at 03/30/13 1915 Last data filed at 03/30/13 1700  Gross per 24 hour  Intake  600 ml  Output   2450 ml  Net  -1850 ml   Exam: General: No acute respiratory distress Lungs: Clear to auscultation bilaterally without wheezes or crackles Cardiovascular: Regular rate and rhythm without murmur gallop or rub normal S1 and S2 Abdomen: Nontender, nondistended, soft, bowel sounds positive, no rebound, no ascites, no appreciable mass Extremities: No significant cyanosis, clubbing;  3++ edema L LE w/ less severe erythema - trace edema  only R LE  Data Reviewed: Basic Metabolic Panel:  Recent Labs Lab 03/26/13 1654 03/27/13 0510 03/28/13 0345 03/30/13 0430  NA 130* 132* 133* 137  K 3.9 3.9 3.7 3.9  CL 95* 100 100 101  CO2 23 24 24 28   GLUCOSE 86 109* 97 91  BUN 5* 8 8 7   CREATININE 0.63 0.61 0.60 0.59  CALCIUM 9.2 8.5 8.2* 8.8   Liver Function Tests:  Recent Labs Lab 03/26/13 1654 03/27/13 0510 03/28/13 0345  AST 21 19 19   ALT 16 15 11   ALKPHOS 68 62 60  BILITOT 0.2* 0.5 0.2*  PROT 7.5 6.4 5.6*  ALBUMIN 3.0* 2.7* 2.4*   CBC:  Recent Labs Lab 03/26/13 1654 03/27/13 0025 03/27/13 0510 03/28/13 0030 03/28/13 0345 03/29/13 0635  WBC 9.8  --  9.6 9.1 9.3 11.0*  NEUTROABS 6.8  --   --   --   --   --   HGB 12.0* 12.5* 11.4* 11.3* 11.4* 11.2*  HCT 35.3* 36.8* 34.0* 33.1* 34.4* 34.6*  MCV 97.2  --  98.6 98.2 98.3 99.4  PLT 467*  --  272 246 255 290    Recent Results (from the past 240 hour(s))  MRSA PCR SCREENING     Status: None   Collection Time    03/26/13  8:25 PM      Result Value Range Status   MRSA by PCR NEGATIVE  NEGATIVE Final   Comment:            The GeneXpert MRSA Assay (FDA     approved for NASAL specimens     only), is one component of a     comprehensive MRSA colonization     surveillance program. It is not     intended to diagnose MRSA     infection nor to guide or     monitor treatment for     MRSA infections.     Studies:  Recent x-ray studies have been reviewed in detail by the Attending Physician  Scheduled Meds:  Scheduled Meds: . folic acid  1 mg Oral Daily  . multivitamin with minerals  1 tablet Oral Daily  . nicotine  21 mg Transdermal Daily  . Rivaroxaban  15 mg Oral BID WC  . sodium chloride  3 mL Intravenous Q12H  . thiamine  100 mg Oral Daily   Continuous Infusions:    Time spent on care of this patient:   [name] [phone]   On-Call/Text Page:      [email or web address]      password [email or web address]  If 7PM-7AM, please contact  night-coverage [email or web address] Password [facility] 03/30/2013, 7:15 PM   LOS: 4 days

## 2013-03-31 NOTE — Progress Notes (Addendum)
[facility] Progress Note    [name] HYQ:[id number] DOB: 06-05-1966 DOA: 03/26/2013 PCP: Default, Provider, MD  Brief narrative: 47 y.o. 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 He is supposed to be on chronic Lovenox therapy, 90 mg twice a day for his prior history of DVT. He had a 5 day interruption in his Lovenox secondary to being incarcerated (released on 03/22/13). The patient stated he developed the redness, swelling and pain 4 days prior to his admission. He was evaluated in the ER but they could not do a Doppler that night and he was released and instructed to continue his usual dose of Lovenox (which he resumed after being released from jail) and to return in one week if he continues to have problems with swelling. Patient had massive swelling of his left lower extremity and explained he had to have lysis of a thrombus in the past. His first blood clot was approximately 4 years ago. He does not have a PCP, but sees Dr. Gaylyn [name].   Assessment/Plan:  Phlegmasia cerulea dolens, left lower extremity Now s/p IR intervention with TPA and TNK infusion - returned to IR for angioplasty and angiojet as TNK had to be stopped due to falling fibrinogen level - transition back to lovenox tody - review of office visits with Dr. Gaylyn [name] reveals that compliance was an issue, and that pt is not felt to have "failed" prior tx attempts with coumadin or Xarelto, but rather to have been noncompliant - discussed in detail about compliance, he reported that he did not have medicaid previously, hence has been missing medications. Now he has medicaid and will be able to obtain meds and will be compliant. Agreed to be on xarelto rather than lovenox injections.   Lupus anticoagulant disorder  w/ prior hx of DVT/PE s/p IVC filter On IV heparin to be transitioned to  xarelto  - requires lifelong anticoag   Mild hyponatremia Slowly improving - related to EtOH abuse   Alcohol abuse Detoxification with Ativan per CIWA protocol - supplement thiamine and folic acid - no florid withdrawal at this time   Tobacco abuse Counseled on need to d/c tobacco   Hepatitis B LFTs are normal at this time  Code Status: FULL Family Communication: no family present at time of exam Disposition Plan: awaiting ALF disposition if possible.   Consultants: IR  Procedures: 7/20 - Venous doppler L LE - extensive, acute, occlusive DVT noted throughout the left lower extremity 7/20 - LLE venogram and cavogram - reflux of TPA into proximal calf veins - initiation of venous lysis at 0.5 mg TNK/hr 7/21 - FOLLOW-UP VENOGRAPHY - MECHANICAL THROMBECTOMY OF THE FEMORAL AND ILIAC VEINS - BALLOON ANGIOPLASTY OF FEMORAL AND ILIAC VEINS  Antibiotics: none  DVT prophylaxis: IV heparin >> lovenox >. xarelto  HPI/Subjective: Pt c/o some modest pain in L leg.  He feels the leg is "a little less swollen" and significantly less red.  He denies cp or sob. No hematochezia or melena.  Objective: Blood pressure 128/79, pulse 70, temperature 98.8 F (37.1 C), temperature source Oral, resp. rate 18, height 6\' 2"  (1.88 m), weight 86.2 kg (190 lb 0.6 oz), SpO2 100.00%.  Intake/Output Summary (Last 24 hours) at 03/31/13 1318 Last data filed at 03/31/13 0953  Gross per 24 hour  Intake    240 ml  Output   2900 ml  Net  -2660 ml   Exam: General: No acute respiratory distress Lungs: Clear to auscultation bilaterally without wheezes or crackles Cardiovascular: Regular rate and rhythm without murmur gallop or rub normal S1 and S2 Abdomen: Nontender, nondistended, soft, bowel sounds positive, no rebound, no ascites, no appreciable mass Extremities: No significant cyanosis, clubbing;  3++ edema L LE w/ less severe  erythema - trace edema only R LE  Data Reviewed: Basic Metabolic Panel:  Recent Labs Lab 03/26/13 1654 03/27/13 0510 03/28/13 0345 03/30/13 0430  NA 130* 132* 133* 137  K 3.9 3.9 3.7 3.9  CL 95* 100 100 101  CO2 23 24 24 28   GLUCOSE 86 109* 97 91  BUN 5* 8 8 7   CREATININE 0.63 0.61 0.60 0.59  CALCIUM 9.2 8.5 8.2* 8.8   Liver Function Tests:  Recent Labs Lab 03/26/13 1654 03/27/13 0510 03/28/13 0345  AST 21 19 19   ALT 16 15 11   ALKPHOS 68 62 60  BILITOT 0.2* 0.5 0.2*  PROT 7.5 6.4 5.6*  ALBUMIN 3.0* 2.7* 2.4*   CBC:  Recent Labs Lab 03/26/13 1654 03/27/13 0025 03/27/13 0510 03/28/13 0030 03/28/13 0345 03/29/13 0635  WBC 9.8  --  9.6 9.1 9.3 11.0*  NEUTROABS 6.8  --   --   --   --   --   HGB 12.0* 12.5* 11.4* 11.3* 11.4* 11.2*  HCT 35.3* 36.8* 34.0* 33.1* 34.4* 34.6*  MCV 97.2  --  98.6 98.2 98.3 99.4  PLT 467*  --  272 246 255 290    Recent Results (from the past 240 hour(s))  MRSA PCR SCREENING     Status: None   Collection Time    03/26/13  8:25 PM      Result Value Range Status   MRSA by PCR NEGATIVE  NEGATIVE Final   Comment:            The GeneXpert MRSA Assay (FDA     approved for NASAL specimens     only), is one component of a     comprehensive MRSA colonization     surveillance program. It is not     intended to diagnose MRSA     infection nor to guide or     monitor treatment for     MRSA infections.     Studies:  Recent x-ray studies have been reviewed in detail by the Attending Physician  Scheduled Meds:  Scheduled Meds: . folic acid  1 mg Oral Daily  . multivitamin with minerals  1 tablet Oral Daily  . nicotine  21 mg Transdermal Daily  . Rivaroxaban  15 mg Oral BID WC  . sodium chloride  3 mL Intravenous Q12H  . thiamine  100 mg Oral Daily       Time spent on care of this patient:   [name] [phone]   On-Call/Text Page:      [email or web address]      password [email or web address]  If 7PM-7AM, please contact  night-coverage [email or web address] Password [email or web address] 03/31/2013, 1:18 PM   LOS: 5 days

## 2013-03-31 NOTE — Progress Notes (Signed)
Occupational Therapy Evaluation Patient Details Name: Mat Stuard MRN: [id number] DOB: 02/24/1966 Today's Date: 03/31/2013 Time: 4098-1191 OT Time Calculation (min): 27 min  OT Assessment / Plan / Recommendation History of present illness 47 y.o. 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   Clinical Impression   PTA, pt lived in a tent in the woods and was independent with ADL and mobility. Pt walked 3-4 miles to the drugstore to get his meds. Currently, pt requires a RW for mobility, but is overall able to complete his self care with increased time. Pt will need to go to  an ALF until he is able to ambulate without a RW, as [place]. Any further OT needs can be addressed at ALF. Acute OT signing off.    OT Assessment  Patient does not need any further OT services    Follow Up Recommendations  Supervision - Intermittent;Other (comment) (ALF)    Barriers to Discharge      Equipment Recommendations  None recommended by OT    Recommendations for Other Services    Frequency       Precautions / Restrictions Precautions Precautions: Fall Precaution Comments: pt reports h/o lots of falls   Pertinent Vitals/Pain no apparent distress C/o pain LLE 5. nsg aware    ADL  Toilet Transfer: Supervision/safety Toilet Transfer Method: Other (comment) (ambulating) Equipment Used: Rolling walker Transfers/Ambulation Related to ADLs: S ADL Comments: overall mod I with incresed time.    OT Diagnosis:    OT Problem List:   OT Treatment Interventions:     OT Goals(Current goals can be found in the care plan section) Acute Rehab OT Goals Patient Stated Goal: to get back to normal  Visit Information  Last OT Received On:  03/31/13 Assistance Needed: +1 History of Present Illness: 47 y.o. 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       Prior Functioning     Home Living Family/patient expects to be discharged to:: Other (Comment) Additional Comments: lives in tent (camps in woods by himself, Does not go to shelter in cold) Prior Function Level of Independence: Independent Comments: has to walk 3-4 miles to drug store where he gets his meds Communication Communication: No difficulties         Vision/Perception Vision - History Baseline Vision: Wears glasses all the time (does not have any) Visual History: Other (comment)   Cognition  Cognition Arousal/Alertness: Awake/alert Behavior During Therapy: WFL for tasks assessed/performed Overall Cognitive Status: History of cognitive impairments - at baseline (most likely baseline functioning)    Extremity/Trunk Assessment Upper Extremity Assessment Upper Extremity Assessment: Overall WFL for tasks assessed Lower Extremity Assessment Lower Extremity Assessment: LLE deficits/detail LLE Deficits / Details: L ankle DF/PF decreased 75% due to pain/edema LLE: Unable to fully assess due to pain     Mobility Bed Mobility Bed Mobility: Supine to Sit;Sit to Supine Supine to Sit: 6: Modified independent (Device/Increase time) Sit to Supine: 6: Modified independent (Device/Increase time) Transfers Transfers: Sit to Stand;Stand to Sit Sit to Stand:  5: Supervision Stand to Sit: 5: Supervision Details for Transfer Assistance: VCs for hand placement     Exercise     Balance     End of Session OT - End of Session Equipment Utilized During Treatment: Rolling walker Activity Tolerance: Patient tolerated treatment  well Patient left: in chair;with call bell/phone within reach Nurse Communication: Mobility status  GO     Georgean Spainhower,HILLARY 03/31/2013, 11:50 AM Luisa Dago, OTR/L  [phone] 03/31/2013

## 2013-03-31 NOTE — Progress Notes (Signed)
Clinical Social Work Department CLINICAL SOCIAL WORK PLACEMENT NOTE 03/31/2013  Patient:  [name], [name]  Account Number:  [id number] Admit date:  03/26/2013  Clinical Social Worker:  Robin Searing  Date/time:  03/31/2013 03:29 PM  Clinical Social Work is seeking post-discharge placement for this patient at the following level of care:   ASSISTED LIVING/REST HOME   (*CSW will update this form in Epic as items are completed)   03/31/2013  Patient/family provided with [facility] Department of Clinical Social Work's list of facilities offering this level of care within the geographic area requested by the patient (or if unable, by the patient's family).  03/31/2013  Patient/family informed of their freedom to choose among providers that offer the needed level of care, that participate in Medicare, Medicaid or managed care program needed by the patient, have an available bed and are willing to accept the patient.  03/31/2013  Patient/family informed of [facility]' ownership interest in [facility], as well as of the fact that they are under no obligation to receive care at this facility.  PASARR submitted to EDS on 03/31/2013 PASARR number received from EDS on 03/31/2013  FL2 transmitted to all facilities in geographic area requested by pt/family on  03/31/2013 FL2 transmitted to all facilities within larger geographic area on   Patient informed that his/her managed care company has contracts with or will negotiate with  certain facilities, including the following:     Patient/family informed of bed offers received:   Patient chooses bed at  Physician recommends and patient chooses bed at    Patient to be transferred to  on   Patient to be transferred to facility by   The following physician request were entered in Epic:   Additional Comments: Reece Levy, MSW [phone]

## 2013-03-31 NOTE — Progress Notes (Signed)
Met with patient who is open to ALF placement at d/c- he states he has been to [facility]F in the past and is open to this option again to receive adequate care/clean envmt while his leg is healing.  Reece Levy, MSW [phone]

## 2013-04-01 DIAGNOSIS — L02419 Cutaneous abscess of limb, unspecified: Secondary | ICD-10-CM

## 2013-04-01 LAB — CBC
MCH: 31.9 pg (ref 26.0–34.0)
MCHC: 32.5 g/dL (ref 30.0–36.0)
RDW: 13.7 % (ref 11.5–15.5)

## 2013-04-01 MED ORDER — DOXYCYCLINE HYCLATE 100 MG PO TABS: 100.0000 mg | ORAL_TABLET | Freq: Two times a day (BID) | ORAL | Status: DC

## 2013-04-01 MED ORDER — OXYCODONE HCL 5 MG PO TABS
10.0000 mg | ORAL_TABLET | ORAL | Status: DC | PRN
  Administered 2013-04-01 – 2013-04-06 (×26): 10 mg via ORAL
  Filled 2013-04-01 (×26): qty 2

## 2013-04-01 MED ORDER — VANCOMYCIN HCL IN DEXTROSE 1-5 GM/200ML-% IV SOLN
1000.0000 mg | Freq: Two times a day (BID) | INTRAVENOUS | Status: DC
  Administered 2013-04-01 – 2013-04-03 (×6): 1000 mg via INTRAVENOUS
  Filled 2013-04-01 (×8): qty 200

## 2013-04-01 NOTE — Progress Notes (Signed)
ANTIBIOTIC CONSULT NOTE - INITIAL  Pharmacy Consult for vancomycin Indication: cellulitis  No Known Allergies  Patient Measurements: Height: 6\' 2"  (188 cm) Weight: 190 lb 0.6 oz (86.2 kg) IBW/kg (Calculated) : 82.2  Vital Signs: Temp: 99.1 F (37.3 C) (07/26 0640) Temp src: Oral (07/26 0640) BP: 114/68 mmHg (07/26 0640) Pulse Rate: 74 (07/26 0640) Intake/Output from previous day: 07/25 0701 - 07/26 0700 In: 715 [P.O.:715] Out: 1550 [Urine:1550] Intake/Output from this shift: Total I/O In: 240 [P.O.:240] Out: 600 [Urine:600]  Labs:  Recent Labs  03/30/13 0430 04/01/13 0435  WBC  --  10.7*  HGB  --  11.5*  PLT  --  345  CREATININE 0.59  --    Estimated Creatinine Clearance: 132.7 ml/min (by C-G formula based on Cr of 0.59). No results found for this basename: VANCOTROUGH, Leodis Binet, VANCORANDOM, GENTTROUGH, GENTPEAK, GENTRANDOM, TOBRATROUGH, TOBRAPEAK, TOBRARND, AMIKACINPEAK, AMIKACINTROU, AMIKACIN,  in the last 72 hours   Microbiology: Recent Results (from the past 720 hour(s))  MRSA PCR SCREENING     Status: None   Collection Time    03/26/13  8:25 PM      Result Value Range Status   MRSA by PCR NEGATIVE  NEGATIVE Final   Comment:            The GeneXpert MRSA Assay (FDA     approved for NASAL specimens     only), is one component of a     comprehensive MRSA colonization     surveillance program. It is not     intended to diagnose MRSA     infection nor to guide or     monitor treatment for     MRSA infections.    Medical History: Past Medical History  Diagnosis Date  . Coronary artery disease   . Degenerative joint disease of spine   . Pulmonary embolism 06/2010  . Degenerative joint disease   . 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 ETOH abuse   . DVT (deep venous thrombosis)   . Hepatitis B   . Mental disorder   . Depression     Medications:  Prescriptions prior to admission  Medication Sig Dispense Refill  . enoxaparin (LOVENOX) 100  MG/ML injection Inject 90 mg into the skin every 12 (twelve) hours.      Marland Kitchen oxyCODONE-acetaminophen (PERCOCET/ROXICET) 5-325 MG per tablet Take 2 tablets by mouth every 6 (six) hours as needed for pain.  30 tablet  0  . traZODone (DESYREL) 50 MG tablet Take 50 mg by mouth at bedtime as needed for sleep.       Assessment: 47 year old man to start on vancomycin IV for cellulitis  Goal of Therapy:  Vancomycin trough level 10-15 mcg/ml  Plan:  Measure antibiotic drug levels at steady state Follow up culture results Vancomycin 1g IV q12h - monitor renal function  Mickeal Skinner 04/01/2013,12:01 PM

## 2013-04-01 NOTE — Progress Notes (Signed)
[facility] Progress Note    [name] ZOX:[id number] DOB: May 22, 1966 DOA: 03/26/2013 PCP: Default, Provider, MD  Brief narrative: 47 y.o. 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 He is supposed to be on chronic Lovenox therapy, 90 mg twice a day for his prior history of DVT. He had a 5 day interruption in his Lovenox secondary to being incarcerated (released on 03/22/13). The patient stated he developed the redness, swelling and pain 4 days prior to his admission. He was evaluated in the ER but they could not do a Doppler that night and he was released and instructed to continue his usual dose of Lovenox (which he resumed after being released from jail) and to return in one week if he continues to have problems with swelling. Patient had massive swelling of his left lower extremity and explained he had to have lysis of a thrombus in the past. His first blood clot was approximately 4 years ago. He does not have a PCP, but sees Dr. Gaylyn Rong. 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 Plan for disposition to ALF on Monday. Meanwhile his left leg is tender and erythematous, with some cellulitis changes started him on IV vancomycin.   Assessment/Plan:  Phlegmasia cerulea dolens, left lower extremity Now s/p IR intervention with TPA and TNK infusion - returned to IR for angioplasty and angiojet as TNK had to be stopped due to falling fibrinogen level - transition back to lovenox tody - review of office visits with Dr. Gaylyn Rong reveals that compliance was an issue, and that pt is not felt to have "failed" prior tx attempts with coumadin or Xarelto, but rather to have been noncompliant - discussed in detail about compliance, he  reported that he did not have medicaid previously, hence has been missing medications. Now he has medicaid and will be able to obtain meds and will be compliant. Agreed to be on xarelto rather than lovenox injections.   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   Code Status: FULL Family Communication: no family present at time of exam Disposition Plan: awaiting ALF disposition if possible.   Consultants: IR  Procedures: 7/20 - Venous doppler L LE - extensive, acute, occlusive DVT noted throughout the left lower extremity 7/20 - LLE venogram and cavogram - reflux of TPA into proximal calf veins - initiation of venous lysis at 0.5 mg TNK/hr 7/21 - FOLLOW-UP VENOGRAPHY - MECHANICAL THROMBECTOMY OF THE FEMORAL AND ILIAC VEINS - BALLOON ANGIOPLASTY OF FEMORAL AND ILIAC VEINS  Antibiotics: Vancomycin 7/26  DVT prophylaxis: IV heparin >> lovenox >. xarelto  HPI/Subjective: Pt c/o some modest pain in L leg.  He feels the leg is "a little less swollen" and significantly less red.  He denies cp or sob. No  hematochezia or melena.  Objective: Blood pressure 114/68, pulse 74, temperature 99.1 F (37.3 C), temperature source Oral, resp. rate 18, height 6\' 2"  (1.88 m), weight 86.2 kg (190 lb 0.6 oz), SpO2 96.00%.  Intake/Output Summary (Last 24 hours) at 04/01/13 1150 Last data filed at 04/01/13 1015  Gross per 24 hour  Intake    715 ml  Output   2150 ml  Net  -1435 ml   Exam: General: No acute respiratory  distress Lungs: Clear to auscultation bilaterally without wheezes or crackles Cardiovascular: Regular rate and rhythm without murmur gallop or rub normal S1 and S2 Abdomen: Nontender, nondistended, soft, bowel sounds positive, no rebound, no ascites, no appreciable mass Extremities: No significant cyanosis, clubbing;  3++ edema L LE w/ less severe erythema - trace edema only R LE  Data Reviewed: Basic Metabolic Panel:  Recent Labs Lab 03/26/13 1654 03/27/13 0510 03/28/13 0345 03/30/13 0430  NA 130* 132* 133* 137  K 3.9 3.9 3.7 3.9  CL 95* 100 100 101  CO2 23 24 24 28   GLUCOSE 86 109* 97 91  BUN 5* 8 8 7   CREATININE 0.63 0.61 0.60 0.59  CALCIUM 9.2 8.5 8.2* 8.8   Liver Function Tests:  Recent Labs Lab 03/26/13 1654 03/27/13 0510 03/28/13 0345  AST 21 19 19   ALT 16 15 11   ALKPHOS 68 62 60  BILITOT 0.2* 0.5 0.2*  PROT 7.5 6.4 5.6*  ALBUMIN 3.0* 2.7* 2.4*   CBC:  Recent Labs Lab 03/26/13 1654  03/27/13 0510 03/28/13 0030 03/28/13 0345 03/29/13 0635 04/01/13 0435  WBC 9.8  --  9.6 9.1 9.3 11.0* 10.7*  NEUTROABS 6.8  --   --   --   --   --   --   HGB 12.0*  < > 11.4* 11.3* 11.4* 11.2* 11.5*  HCT 35.3*  < > 34.0* 33.1* 34.4* 34.6* 35.4*  MCV 97.2  --  98.6 98.2 98.3 99.4 98.1  PLT 467*  --  272 246 255 290 345  < > = values in this interval not displayed.  Recent Results (from the past 240 hour(s))  MRSA PCR SCREENING     Status: None   Collection Time    03/26/13  8:25 PM      Result Value Range Status   MRSA by PCR NEGATIVE  NEGATIVE Final   Comment:            The GeneXpert MRSA Assay (FDA     approved for NASAL specimens     only), is one component of a     comprehensive MRSA colonization     surveillance program. It is not     intended to diagnose MRSA     infection nor to guide or     monitor treatment for     MRSA infections.     Studies:  Recent x-ray studies have been reviewed in detail by the Attending Physician  Scheduled  Meds:  Scheduled Meds: . doxycycline  100 mg Oral Q12H  . folic acid  1 mg Oral Daily  . multivitamin with minerals  1 tablet Oral Daily  . nicotine  21 mg Transdermal Daily  . Rivaroxaban  15 mg Oral BID WC  . sodium chloride  3 mL Intravenous Q12H  . thiamine  100 mg Oral Daily       Time spent on care of this patient:   Maelani Yarbro [phone]   On-Call/Text Page:      [email or web address]  password [email or web address]  If 7PM-7AM, please contact night-coverage [email or web address] Password [email or web address] 04/01/2013, 11:50 AM   LOS: 6 days

## 2013-04-02 DIAGNOSIS — F17200 Nicotine dependence, unspecified, uncomplicated: Secondary | ICD-10-CM

## 2013-04-02 NOTE — Progress Notes (Signed)
[facility] Progress Note    [name] ZOX:[id number] DOB: 1966-07-01 DOA: 03/26/2013 PCP: Default, Provider, MD  Brief narrative: 47 y.o. 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 He is supposed to be on chronic Lovenox therapy, 90 mg twice a day for his prior history of DVT. He had a 5 day interruption in his Lovenox secondary to being incarcerated (released on 03/22/13). The patient stated he developed the redness, swelling and pain 4 days prior to his admission. He was evaluated in the ER but they could not do a Doppler that night and he was released and instructed to continue his usual dose of Lovenox (which he resumed after being released from jail) and to return in one week if he continues to have problems with swelling. Patient had massive swelling of his left lower extremity and explained he had to have lysis of a thrombus in the past. His first blood clot was approximately 4 years ago. He does not have a PCP, but sees Dr. Gaylyn Rong. 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 Plan for disposition to ALF on Monday. Meanwhile his left leg is tender and erythematous, with some cellulitis changes started him on IV vancomycin.   Assessment/Plan:  Phlegmasia cerulea dolens, left lower extremity Now s/p IR intervention with TPA and TNK infusion - returned to IR for angioplasty and angiojet as TNK had to be stopped due to falling fibrinogen level - transition back to lovenox tody - review of office visits with Dr. Gaylyn Rong reveals that compliance was an issue, and that pt is not felt to have "failed" prior tx attempts with coumadin or Xarelto, but rather to have been noncompliant - discussed in detail about compliance, he  reported that he did not have medicaid previously, hence has been missing medications. Now he has medicaid and will be able to obtain meds and will be compliant. Agreed to be on xarelto rather than lovenox injections.   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   Code Status: FULL Family Communication: no family present at time of exam Disposition Plan: awaiting ALF disposition if possible.   Consultants: IR  Procedures: 7/20 - Venous doppler L LE - extensive, acute, occlusive DVT noted throughout the left lower extremity 7/20 - LLE venogram and cavogram - reflux of TPA into proximal calf veins - initiation of venous lysis at 0.5 mg TNK/hr 7/21 - FOLLOW-UP VENOGRAPHY - MECHANICAL THROMBECTOMY OF THE FEMORAL AND ILIAC VEINS - BALLOON ANGIOPLASTY OF FEMORAL AND ILIAC VEINS  Antibiotics: Vancomycin 7/26  DVT prophylaxis: IV heparin >> lovenox >. xarelto  HPI/Subjective: Pain is better controlled. Looking forward to going to ALF in am.   Objective: Blood pressure 112/64, pulse 60, temperature 99.1 F (37.3 C), temperature source Oral, resp.  rate 17, height 6\' 2"  (1.88 m), weight 86.2 kg (190 lb 0.6 oz), SpO2 100.00%.  Intake/Output Summary (Last 24 hours) at 04/02/13 1130 Last data filed at 04/02/13 0100  Gross per 24 hour  Intake    240 ml  Output   2050 ml  Net  -1810 ml   Exam: General: No acute respiratory distress Lungs: Clear to auscultation bilaterally without wheezes or crackles Cardiovascular:  Regular rate and rhythm without murmur gallop or rub normal S1 and S2 Abdomen: Nontender, nondistended, soft, bowel sounds positive, no rebound, no ascites, no appreciable mass Extremities: No significant cyanosis, clubbing;  3++ edema L LE w/ less severe erythema - trace edema only R LE  Data Reviewed: Basic Metabolic Panel:  Recent Labs Lab 03/26/13 1654 03/27/13 0510 03/28/13 0345 03/30/13 0430  NA 130* 132* 133* 137  K 3.9 3.9 3.7 3.9  CL 95* 100 100 101  CO2 23 24 24 28   GLUCOSE 86 109* 97 91  BUN 5* 8 8 7   CREATININE 0.63 0.61 0.60 0.59  CALCIUM 9.2 8.5 8.2* 8.8   Liver Function Tests:  Recent Labs Lab 03/26/13 1654 03/27/13 0510 03/28/13 0345  AST 21 19 19   ALT 16 15 11   ALKPHOS 68 62 60  BILITOT 0.2* 0.5 0.2*  PROT 7.5 6.4 5.6*  ALBUMIN 3.0* 2.7* 2.4*   CBC:  Recent Labs Lab 03/26/13 1654  03/27/13 0510 03/28/13 0030 03/28/13 0345 03/29/13 0635 04/01/13 0435  WBC 9.8  --  9.6 9.1 9.3 11.0* 10.7*  NEUTROABS 6.8  --   --   --   --   --   --   HGB 12.0*  < > 11.4* 11.3* 11.4* 11.2* 11.5*  HCT 35.3*  < > 34.0* 33.1* 34.4* 34.6* 35.4*  MCV 97.2  --  98.6 98.2 98.3 99.4 98.1  PLT 467*  --  272 246 255 290 345  < > = values in this interval not displayed.  Recent Results (from the past 240 hour(s))  MRSA PCR SCREENING     Status: None   Collection Time    03/26/13  8:25 PM      Result Value Range Status   MRSA by PCR NEGATIVE  NEGATIVE Final   Comment:            The GeneXpert MRSA Assay (FDA     approved for NASAL specimens     only), is one component of a     comprehensive MRSA colonization     surveillance program. It is not     intended to diagnose MRSA     infection nor to guide or     monitor treatment for     MRSA infections.     Studies:  Recent x-ray studies have been reviewed in detail by the Attending Physician  Scheduled Meds:  Scheduled Meds: . folic acid  1 mg Oral Daily  . multivitamin with minerals  1 tablet Oral Daily   . nicotine  21 mg Transdermal Daily  . Rivaroxaban  15 mg Oral BID WC  . sodium chloride  3 mL Intravenous Q12H  . thiamine  100 mg Oral Daily  . vancomycin  1,000 mg Intravenous Q12H       Time spent on care of this patient:   Sanders Manninen [phone]   On-Call/Text Page:      [email or web address]      password [email or web address]  If 7PM-7AM, please contact night-coverage [email or web address] Password [email or web address] 04/02/2013, 11:30 AM  LOS: 7 days

## 2013-04-02 NOTE — Progress Notes (Signed)
ANTICOAGULATION CONSULT NOTE - Follow Up Consult  Pharmacy Consult for Xarelto Indication: Recurrent DVT with + Lupus Anticoagulant  No Known Allergies  Patient Measurements: Height: 6\' 2"  (188 cm) Weight: 190 lb 0.6 oz (86.2 kg) IBW/kg (Calculated) : 82.2  Vital Signs: Temp: 99.1 F (37.3 C) (07/27 0343) Temp src: Oral (07/27 0343) BP: 112/64 mmHg (07/27 0343) Pulse Rate: 60 (07/27 0343)  Labs:  Recent Labs  04/01/13 0435  HGB 11.5*  HCT 35.4*  PLT 345    Estimated Creatinine Clearance: 132.7 ml/min (by C-G formula based on Cr of 0.59).  Assessment: 47 y/o homeless male with recurrent DVT, + Lupus anticoagulant, IVC Filter, and history of medication non-compliance on Xarelto in preparation for hospital discharge. Patient has Medicaid now and should be able to obtain medication per MD note. No bleeding noted, CBC is stable. Renal function is stable.  Goal of Therapy:  therapeutic anticoagulation   Plan:  -Xarelto 15 mg PO bid with meals for 21 days  (thru 8/13) then 20 mg PO qdws -Monitor for signs/symptoms of bleeding Discharge education complete  [facility], [place].D., BCPS Clinical Pharmacist Pager: [phone] 04/02/2013 2:04 PM

## 2013-04-03 MED ORDER — THIAMINE HCL 100 MG PO TABS: 100.0000 mg | ORAL_TABLET | Freq: Every day | ORAL | Status: DC

## 2013-04-03 MED ORDER — OXYCODONE-ACETAMINOPHEN 5-325 MG PO TABS: 2.0000 | ORAL_TABLET | Freq: Four times a day (QID) | ORAL | Status: DC | PRN

## 2013-04-03 MED ORDER — DOXYCYCLINE HYCLATE 50 MG PO CAPS: 100.0000 mg | ORAL_CAPSULE | Freq: Two times a day (BID) | ORAL | Status: DC

## 2013-04-03 MED ORDER — NICOTINE 21 MG/24HR TD PT24: 1.0000 | MEDICATED_PATCH | Freq: Every day | TRANSDERMAL | Status: DC

## 2013-04-03 MED ORDER — RIVAROXABAN 15 MG PO TABS: 15.0000 mg | ORAL_TABLET | Freq: Two times a day (BID) | ORAL | Status: DC

## 2013-04-03 MED ORDER — FOLIC ACID 1 MG PO TABS: 1.0000 mg | ORAL_TABLET | Freq: Every day | ORAL | Status: DC

## 2013-04-03 MED ORDER — RIVAROXABAN 20 MG PO TABS: 20.0000 mg | ORAL_TABLET | Freq: Every day | ORAL | Status: DC

## 2013-04-03 MED ORDER — ADULT MULTIVITAMIN W/MINERALS CH: 1.0000 | ORAL_TABLET | Freq: Every day | ORAL | Status: DC

## 2013-04-03 NOTE — Discharge Summary (Signed)
Physician Discharge Summary  [name] AVW:[id number] DOB: 10-08-1965 DOA: 03/26/2013  PCP: Default, Provider, MD  Admit date: 03/26/2013 Discharge date: 04/03/2013  Time spent: 30 minutes  Recommendations for Outpatient Follow-up:  1. Follow up with PCP in one week.  2. FOLLOW UP with Dr Gaylyn Rong in 2 weeks.   Discharge Diagnoses:  Principal Problem:   Phlegmasia cerulea dolens, left lower extremity Active Problems:   Alcohol abuse   Tobacco abuse   Lupus anticoagulant disorder   Discharge Condition: improved.   Diet recommendation: regular  Filed Weights   03/27/13 0015 04/03/13 0617  Weight: 86.2 kg (190 lb 0.6 oz) 87.4 kg (192 lb 10.9 oz)    Brief narrative:  47 y.o. 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 He is supposed to be on chronic Lovenox therapy, 90 mg twice a day for his prior history of DVT. He had a 5 day interruption in his Lovenox secondary to being incarcerated (released on 03/22/13). The patient stated he developed the redness, swelling and pain 4 days prior to his admission. He was evaluated in the ER but they could not do a Doppler that night and he was released and instructed to continue his usual dose of Lovenox (which he resumed after being released from jail) and to return in one week if he continues to have problems with swelling. Patient had massive swelling of his left lower extremity and explained he had to have lysis of a thrombus in the past. His first blood clot was approximately 4 years ago. He does not have a PCP, but sees Dr. Gaylyn Rong. 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 Plan for disposition to ALF on Monday. Meanwhile his left leg is tender and erythematous, with some  cellulitis changes started him on IV vancomycin. He will be transitioned to po doxycycline on discharge.   Assessment/Plan:  Phlegmasia cerulea dolens, left lower extremity  Now s/p IR intervention with TPA and TNK infusion - returned to IR for angioplasty and angiojet as TNK had to be stopped due to falling fibrinogen level - transition back to lovenox tody - review of office visits with Dr. Gaylyn Rong reveals that compliance was an issue, and that pt is not felt to have "failed" prior tx attempts with coumadin or Xarelto, but rather to have been noncompliant - discussed in detail about compliance, he reported that he did not have medicaid previously, hence has been missing medications. Now he has medicaid and will be able to obtain meds and will be compliant. Agreed to be on xarelto rather than lovenox injections.  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      Consultants:  IR  Procedures:  7/20 - Venous doppler L LE - extensive, acute, occlusive DVT noted throughout the left lower extremity  7/20 - LLE venogram and cavogram - reflux of TPA into proximal calf veins - initiation of venous lysis at 0.5 mg TNK/hr  7/21 - FOLLOW-UP VENOGRAPHY - MECHANICAL THROMBECTOMY OF THE FEMORAL AND ILIAC VEINS - BALLOON ANGIOPLASTY OF FEMORAL AND ILIAC VEINS   Discharge Exam: Filed Vitals:   04/02/13 0343 04/02/13 1500 04/02/13 2014 04/03/13 0617  BP: 112/64 100/61  98/60 96/49  Pulse: 60 70 70 68  Temp: 99.1 F (37.3 C) 98.2 F (36.8 C) 97.9 F (36.6 C) 97.9 F (36.6 C)  TempSrc: Oral Oral Oral   Resp: 17 16 18 18   Height:      Weight:    87.4 kg (192 lb 10.9 oz)  SpO2: 100% 99% 98% 98%    General: No acute respiratory distress  Lungs: Clear to auscultation bilaterally without wheezes or crackles  Cardiovascular: Regular rate and rhythm without murmur gallop or rub normal S1 and S2  Abdomen: Nontender, nondistended, soft, bowel sounds positive, no rebound, no ascites, no appreciable mass  Extremities: No significant cyanosis, clubbing; 3++ edema L LE w/ less severe erythema - trace edema only R LE  Discharge Instructions   Future Appointments Provider Department Dept Phone   04/21/2013 8:30 AM [facility] [phone]   04/21/2013 9:00 AM [facility]  [facility] [phone]       Medication List    ASK your doctor about these medications       enoxaparin 100 MG/ML injection  Commonly known as:  LOVENOX  Inject 90 mg into the skin every 12 (twelve) hours.     oxyCODONE-acetaminophen 5-325 MG per tablet  Commonly known as:  PERCOCET/ROXICET  Take 2 tablets by mouth every 6 (six) hours as needed for pain.     traZODone 50 MG tablet  Commonly known as:  DESYREL  Take 50 mg by mouth at bedtime as needed for sleep.       No Known Allergies    The results of significant diagnostics from this hospitalization (including imaging, microbiology, ancillary and laboratory) are listed below for reference.    Significant Diagnostic Studies: Dg Chest 2 View  03/22/2013   *RADIOLOGY REPORT*  Clinical Data: Shortness of breath with exertion; history of smoking.  Left leg pain and swelling.  CHEST - 2 VIEW  Comparison: Chest radiograph and CTA of the chest performed 12/11/2012  Findings: The lungs are well-aerated.  Mild chronic peribronchial  thickening is seen.  There is no evidence of focal opacification, pleural effusion or pneumothorax.  A stable calcified granuloma is noted at the left midlung zone.  The heart is normal in size; the mediastinal contour is within normal limits.  No acute osseous abnormalities are seen.  IMPRESSION: Mild chronic peribronchial thickening seen; no acute cardiopulmonary process identified.   Original Report Authenticated By: Tonia Ghent, M.D.   Ct Head Wo Contrast  03/05/2013   *RADIOLOGY REPORT*  Clinical Data: The patient complains of "aneurysm".  Mental status changes.  CT HEAD WITHOUT CONTRAST  Technique:  Contiguous axial images were obtained from the base of the skull through the vertex without contrast.  Comparison: 08/04/2012  Findings: Bone windows demonstrate mucous retention cyst or polyp in the left sphenoid sinus.  Minimal motion degradation. Clear mastoid air cells.  Soft tissue windows demonstrate mildly advanced for age cerebral atrophy.  Mega cisterna magna. No  mass lesion, hemorrhage, hydrocephalus, acute infarct, intra-axial, or extra-axial fluid collection.  IMPRESSION:  1. No acute intracranial abnormality. 2.  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   Original Report Authenticated By: Jeronimo Greaves, M.D.   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  He has a history of prior vena thromboembolism with a successful bilateral lower extremity lysis procedure done in May 2013.  He has been on Lovenox therapy which has been successful for until recently when he was incarcerated and missed 5 days and is therapeutic regimen.  He began experiencing progressive left lower extremity swelling shortly thereafter.  He now presents and extremity with massive left lower extremity swelling  and extensive superficial and deep venous thrombus.  Procedures Performed: 1. Ultrasound-guided puncture of the left popliteal vein 2.  Dilated left lower extremity venogram 3.  Successful catheterization of the inferior vena cava 4.  Inferior vena cava gram 5.  Balloon occluded reflux of TPA into the proximal calf veins 6.  Placement of multi side-hole infusion catheter initiation of venous thrombolysis  Interventional Radiologist:  Sterling Big, MD  Sedation: Moderate (conscious) sedation was used.  Six mg Versed, 200 mcg Fentanyl were administered intravenously.  The patient's vital signs were monitored continuously by radiology nursing throughout the procedure.  Sedation Time: 15 minutes  Fluoroscopy time: 16 minutes  Contrast volume: 40 ml Omnipaque-300  Additional medications:  4 mg TPA administered intravenously  PROCEDURE/FINDINGS:   Informed consent was obtained from the patient following explanation of the procedure, risks, benefits and alternatives. The patient understands, agrees and consents for the procedure. All questions were addressed. A time out was performed.  Maximal barrier sterile technique utilized including caps, mask, sterile gowns, sterile gloves, large sterile drape, hand hygiene, and betadine skin prep.  The left popliteal fossa was interrogated with ultrasound.  The popliteal vein is thrombosed.  Local anesthesia was attained by infiltration of 1% lidocaine.  Under direct sonographic guidance, the vessels punctured with a 21-gauge micropuncture needle.  An image was obtained and stored for the medical record.  A Benson wire was advanced into the right femoral vein and a 7- [place] vascular sheath advanced over the wire.  Through the sheath, a limited left lower extremity venogram was performed.  There is extensive thrombus throughout the popliteal and femoral vein. Using a vertebral catheter and glide wire, the catheter was successfully navigated the common femoral vein.  A  venogram demonstrates complete occlusion of the external and common iliac veins.  There is an extensive network of collateral vessels. Thrombus is also seen extending into the great saphenous vein. After an extensive manipulation, the catheter was finally navigated into the inferior vena cava.  An inferior vena cava gram confirms patency of the cava and IVC filter.  A wire was advanced into the inferior vena cava.  The sheath was pulled back to near the insertion point in the popliteal vein.  A 6 mm balloon was advanced into the popliteal vein above the knee and inflated.  A gentle hand injection of contrast material confirmed reflux into the proximal calf veins which are thrombosed.  A total of 4 mg of TPA was then slowly injected and refluxed into the proximal calf veins.  The sheath was re-advanced into the more distal popliteal  vein.  A 90 cm length, 50 cm infusion Unifuse infusion catheter was then advanced over the wire and positioned so that it covered the common, external, common femoral and the majority of the femoral veins.  The sheath and catheter was secured with a post silk suture and a large sterile bandage.  Thrombolysis was initiated at 0.5 mg TNK- tPA per hour.  IMPRESSION:  1.  Left lower extremity venogram demonstrates complete occlusion of the left common and external iliac veins with extensive thrombus from the common femoral vein into the proximal calf veins as well as within the great saphenous vein.  2.  Reflux of 4 mg of TPA into the proximal calf veins.  3.  Placement of a multi side-hole infusion catheter from the common iliac vein into the distal femoral vein.  4.  Initiation of venous thrombolysis.  The patient will return to interventional radiology in 12 - 18 hours for venogram and lysis check.  I expect the patient will acquire angioplasty, and likely stenting of the left common and external iliac veins.  Signed,  Sterling Big, MD [facility]  [facility]   Original Report Authenticated By: Malachy Moan, M.D.   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  He has a history of prior vena thromboembolism with a successful bilateral lower extremity lysis procedure done in May 2013.  He has been on Lovenox therapy which has been successful for until recently when he was incarcerated and missed 5 days and is therapeutic regimen.  He began experiencing progressive left lower extremity swelling shortly thereafter.  He now presents and extremity with massive left lower extremity swelling and extensive superficial and deep venous thrombus.  Procedures Performed: 1. Ultrasound-guided puncture of the left popliteal vein 2.  Dilated left lower extremity venogram 3.  Successful catheterization of the inferior vena cava 4.  Inferior vena cava gram 5.  Balloon occluded reflux of TPA into the proximal calf veins 6.  Placement of multi side-hole infusion catheter initiation of venous thrombolysis  Interventional Radiologist:  Sterling Big, MD  Sedation: Moderate (conscious) sedation was used.  Six mg Versed, 200 mcg Fentanyl were administered intravenously.  The patient's vital signs were monitored continuously by radiology nursing throughout the procedure.  Sedation Time: 15 minutes  Fluoroscopy time: 16 minutes  Contrast volume: 40 ml Omnipaque-300  Additional medications:  4 mg TPA administered intravenously  PROCEDURE/FINDINGS:   Informed consent was obtained from the patient following explanation of the procedure, risks, benefits and alternatives. The patient understands, agrees and consents for the procedure. All questions were addressed. A time out was performed.  Maximal barrier sterile technique utilized including  caps, mask, sterile gowns, sterile gloves, large sterile drape, hand hygiene, and betadine skin prep.  The left popliteal fossa was interrogated with ultrasound.  The popliteal vein is thrombosed.  Local anesthesia was attained by infiltration of 1% lidocaine.  Under direct sonographic guidance, the vessels punctured with a 21-gauge micropuncture needle.  An image was obtained and stored for the medical record.  A Benson wire was advanced into the right femoral vein and a 7- [place] vascular sheath advanced over the wire.  Through the sheath, a limited left lower extremity venogram was performed.  There is extensive thrombus throughout the popliteal  and femoral vein. Using a vertebral catheter and glide wire, the catheter was successfully navigated the common femoral vein.  A venogram demonstrates complete occlusion of the external and common iliac veins.  There is an extensive network of collateral vessels. Thrombus is also seen extending into the great saphenous vein. After an extensive manipulation, the catheter was finally navigated into the inferior vena cava.  An inferior vena cava gram confirms patency of the cava and IVC filter.  A wire was advanced into the inferior vena cava.  The sheath was pulled back to near the insertion point in the popliteal vein.  A 6 mm balloon was advanced into the popliteal vein above the knee and inflated.  A gentle hand injection of contrast material confirmed reflux into the proximal calf veins which are thrombosed.  A total of 4 mg of TPA was then slowly injected and refluxed into the proximal calf veins.  The sheath was re-advanced into the more distal popliteal vein.  A 90 cm length, 50 cm infusion Unifuse infusion catheter was then advanced over the wire and positioned so that it covered the common, external, common femoral and the majority of the femoral veins.  The sheath and catheter was secured with a post silk suture and a large sterile bandage.  Thrombolysis was  initiated at 0.5 mg TNK- tPA per hour.  IMPRESSION:  1.  Left lower extremity venogram demonstrates complete occlusion of the left common and external iliac veins with extensive thrombus from the common femoral vein into the proximal calf veins as well as within the great saphenous vein.  2.  Reflux of 4 mg of TPA into the proximal calf veins.  3.  Placement of a multi side-hole infusion catheter from the common iliac vein into the distal femoral vein.  4.  Initiation of venous thrombolysis.  The patient will return to interventional radiology in 12 - 18 hours for venogram and lysis check.  I expect the patient will acquire angioplasty, and likely stenting of the left common and external iliac veins.  Signed,  Sterling Big, MD [facility]   Original Report Authenticated By: Malachy Moan, M.D.   Ir Pta Venous Left  03/27/2013   *RADIOLOGY REPORT*  Clinical Data: Extensive left lower extremity iliofemoral DVT and status post initiation of transcatheter thrombolytic therapy yesterday.  Tenecteplase infusion had to be discontinued earlier today due to precipitous drop in fibrinogen to dangerously low levels after overnight thrombolytic therapy.  1.  FOLLOW-UP VENOGRAPHY DURING VENOUS THROMBOLYSIS, FINAL DAY. 2.  MECHANICAL THROMBECTOMY OF THE FEMORAL AND ILIAC VEINS. 3.  BALLOON ANGIOPLASTY OF FEMORAL AND ILIAC VEINS.  Comparison:  Imaging on 03/26/2013.  Sedation: 3.0 mg IV Versed; 150 mcg IV Fentanyl.  Total Moderate Sedation Time: 50 minutes.  Contrast:  65 ml Omnipaque-300  Fluoroscopy Time: 7 minutes and 12 seconds.  Procedure:  The procedure, risks, benefits, and alternatives were explained to the patient.  Questions regarding the procedure were encouraged and answered.  The patient understands and consents to the procedure.  The left popliteal fossa sheath and infusion catheter were prepped with Betadine in a sterile fashion, and a sterile  drape was applied covering the operative field.  A sterile gown and sterile gloves were used for the procedure.  Venography was initially performed through the infusion catheter followed by a sheath.  Infusion catheter was removed over a guidewire.  Mechanical thrombectomy was performed throughout the femoral and iliac veins with the AngioJet  device.  Additional venography was performed through the sheath as well as the AngioJet catheter.  Balloon angioplasty was performed in the thigh at the level of the femoral vein as well as throughout the entire common femoral vein, external iliac vein and common iliac vein with a 7 mm x 4 cm Conquest balloon.  Additional venography was performed.  Additional balloon angioplasty was performed of the common femoral vein, external iliac vein and common iliac vein with a 9 mm x 4 cm Mustang balloon.  Additional venography was performed.  The popliteal vein sheath and guidewire were then removed and hemostasis obtained with manual compression.  Complications: None  Findings: Patency of the native deep veins showed significant improvement compared to pre thrombolytic therapy venography. 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  After AngioJet mechanical thrombectomy, marginal improvement was noted in venous patency.  After balloon angioplasty, further improvement was noted at the level of the femoral vein of the thigh, the common femoral vein and external and common iliac veins. Further dilatation of the common femoral vein and iliac veins were performed with a larger balloon.  There is persistent diffuse stenosis present of the deep venous system.  However, stent placement was not felt necessary as it would require complete reconstruction of the iliac veins as well as the common femoral vein and likely necessitate crossing the  inguinal ligament.  IMPRESSION: Improved patency of the deep venous system of the left lower extremity including the femoral veins and iliac veins after overnight thrombolytic therapy.  Thrombolytic therapy had to be discontinued due to precipitous drop in fibrinogen level.  Patency of the veins was further improved today after mechanical thrombectomy and extensive venous angioplasty.  Popliteal vein access was removed upon completion of the procedure.   Original Report Authenticated By: Irish Lack, M.D.   Ir Thrombect Veno Mech Mod Sed  03/27/2013   *RADIOLOGY REPORT*  Clinical Data: Extensive left lower extremity iliofemoral DVT and status post initiation of transcatheter thrombolytic therapy yesterday.  Tenecteplase infusion had to be discontinued earlier today due to precipitous drop in fibrinogen to dangerously low levels after overnight thrombolytic therapy.  1.  FOLLOW-UP VENOGRAPHY DURING VENOUS THROMBOLYSIS, FINAL DAY. 2.  MECHANICAL THROMBECTOMY OF THE FEMORAL AND ILIAC VEINS. 3.  BALLOON ANGIOPLASTY OF FEMORAL AND ILIAC VEINS.  Comparison:  Imaging on 03/26/2013.  Sedation: 3.0 mg IV Versed; 150 mcg IV Fentanyl.  Total Moderate Sedation Time: 50 minutes.  Contrast:  65 ml Omnipaque-300  Fluoroscopy Time: 7 minutes and 12 seconds.  Procedure:  The procedure, risks, benefits, and alternatives were explained to the patient.  Questions regarding the procedure were encouraged and answered.  The patient understands and consents to the procedure.  The left popliteal fossa sheath and infusion catheter were prepped with Betadine in a sterile fashion, and a sterile drape was applied covering the operative field.  A sterile gown and sterile gloves were used for the procedure.  Venography was initially performed through the infusion catheter followed by a sheath.  Infusion catheter was removed over a guidewire.  Mechanical thrombectomy was performed throughout the femoral and iliac veins with the AngioJet  device.  Additional venography was performed through the sheath as well as the AngioJet catheter.  Balloon angioplasty was performed in the thigh at the level of the femoral vein as well as throughout the entire common femoral  vein, external iliac vein and common iliac vein with a 7 mm x 4 cm Conquest balloon.  Additional venography was performed.  Additional balloon angioplasty was performed of the common femoral vein, external iliac vein and common iliac vein with a 9 mm x 4 cm Mustang balloon.  Additional venography was performed.  The popliteal vein sheath and guidewire were then removed and hemostasis obtained with manual compression.  Complications: None  Findings: Patency of the native deep veins showed significant improvement compared to pre thrombolytic therapy venography. 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  After AngioJet mechanical thrombectomy, marginal improvement was noted in venous patency.  After balloon angioplasty, further improvement was noted at the level of the femoral vein of the thigh, the common femoral vein and external and common iliac veins. Further dilatation of the common femoral vein and iliac veins were performed with a larger balloon.  There is persistent diffuse stenosis present of the deep venous system.  However, stent placement was not felt necessary as it would require complete reconstruction of the iliac veins as well as the common femoral vein and likely necessitate crossing the inguinal ligament.  IMPRESSION: Improved patency of the deep venous system of the left lower extremity including the femoral veins and iliac veins after overnight thrombolytic therapy.  Thrombolytic therapy had to be discontinued due to precipitous drop in fibrinogen level.  Patency of the veins was further improved today after mechanical  thrombectomy and extensive venous angioplasty.  Popliteal vein access was removed upon completion of the procedure.   Original Report Authenticated By: Irish Lack, M.D.   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  He has a history of prior vena thromboembolism with a successful bilateral lower extremity lysis procedure done in May 2013.  He has been on Lovenox therapy which has been successful for until recently when he was incarcerated and missed 5 days and is therapeutic regimen.  He began experiencing progressive left lower extremity swelling shortly thereafter.  He now presents and extremity with massive left lower extremity swelling and extensive superficial and deep venous thrombus.  Procedures Performed: 1. Ultrasound-guided puncture of the left popliteal vein 2.  Dilated left lower extremity venogram 3.  Successful catheterization of the inferior vena cava 4.  Inferior vena cava gram 5.  Balloon occluded reflux of TPA into the proximal calf veins 6.  Placement of multi side-hole infusion catheter initiation of venous thrombolysis  Interventional Radiologist:  Sterling Big, MD  Sedation: Moderate (conscious) sedation was used.  Six mg Versed, 200 mcg Fentanyl were administered intravenously.  The patient's vital signs were monitored continuously by radiology nursing throughout the procedure.  Sedation Time: 15 minutes  Fluoroscopy time: 16 minutes  Contrast volume: 40 ml Omnipaque-300  Additional medications:  4 mg TPA administered intravenously  PROCEDURE/FINDINGS:   Informed consent was obtained from the patient following explanation of the procedure, risks, benefits and alternatives. The patient understands, agrees and consents for the procedure. All  questions were addressed. A time out was performed.  Maximal barrier sterile technique utilized including caps, mask, sterile gowns, sterile gloves, large sterile drape, hand hygiene, and betadine  skin prep.  The left popliteal fossa was interrogated with ultrasound.  The popliteal vein is thrombosed.  Local anesthesia was attained by infiltration of 1% lidocaine.  Under direct sonographic guidance, the vessels punctured with a 21-gauge micropuncture needle.  An image was obtained and stored for the medical record.  A Benson wire was advanced into the right femoral vein and a 7- [place] vascular sheath advanced over the wire.  Through the sheath, a limited left lower extremity venogram was performed.  There is extensive thrombus throughout the popliteal and femoral vein. Using a vertebral catheter and glide wire, the catheter was successfully navigated the common femoral vein.  A venogram demonstrates complete occlusion of the external and common iliac veins.  There is an extensive network of collateral vessels. Thrombus is also seen extending into the great saphenous vein. After an extensive manipulation, the catheter was finally navigated into the inferior vena cava.  An inferior vena cava gram confirms patency of the cava and IVC filter.  A wire was advanced into the inferior vena cava.  The sheath was pulled back to near the insertion point in the popliteal vein.  A 6 mm balloon was advanced into the popliteal vein above the knee and inflated.  A gentle hand injection of contrast material confirmed reflux into the proximal calf veins which are thrombosed.  A total of 4 mg of TPA was then slowly injected and refluxed into the proximal calf veins.  The sheath was re-advanced into the more distal popliteal vein.  A 90 cm length, 50 cm infusion Unifuse infusion catheter was then advanced over the wire and positioned so that it covered the common, external, common femoral and the majority of the femoral veins.  The  sheath and catheter was secured with a post silk suture and a large sterile bandage.  Thrombolysis was initiated at 0.5 mg TNK- tPA per hour.  IMPRESSION:  1.  Left lower extremity venogram demonstrates complete occlusion of the left common and external iliac veins with extensive thrombus from the common femoral vein into the proximal calf veins as well as within the great saphenous vein.  2.  Reflux of 4 mg of TPA into the proximal calf veins.  3.  Placement of a multi side-hole infusion catheter from the common iliac vein into the distal femoral vein.  4.  Initiation of venous thrombolysis.  The patient will return to interventional radiology in 12 - 18 hours for venogram and lysis check.  I expect the patient will acquire angioplasty, and likely stenting of the left common and external iliac veins.  Signed,  Sterling Big, MD [facility]   Original Report Authenticated By: Malachy Moan, M.D.   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  He has a history of prior vena thromboembolism with a successful bilateral lower extremity lysis procedure done in May 2013.  He has been on Lovenox therapy which has been successful for until recently when he was incarcerated and missed 5 days and is therapeutic regimen.  He began experiencing progressive left lower extremity swelling shortly thereafter.  He now presents and extremity with massive left lower extremity swelling and extensive superficial and deep venous thrombus.  Procedures Performed: 1. Ultrasound-guided puncture of the left popliteal  vein 2.  Dilated left lower extremity venogram 3.  Successful catheterization of the inferior vena cava 4.   Inferior vena cava gram 5.  Balloon occluded reflux of TPA into the proximal calf veins 6.  Placement of multi side-hole infusion catheter initiation of venous thrombolysis  Interventional Radiologist:  Sterling Big, MD  Sedation: Moderate (conscious) sedation was used.  Six mg Versed, 200 mcg Fentanyl were administered intravenously.  The patient's vital signs were monitored continuously by radiology nursing throughout the procedure.  Sedation Time: 15 minutes  Fluoroscopy time: 16 minutes  Contrast volume: 40 ml Omnipaque-300  Additional medications:  4 mg TPA administered intravenously  PROCEDURE/FINDINGS:   Informed consent was obtained from the patient following explanation of the procedure, risks, benefits and alternatives. The patient understands, agrees and consents for the procedure. All questions were addressed. A time out was performed.  Maximal barrier sterile technique utilized including caps, mask, sterile gowns, sterile gloves, large sterile drape, hand hygiene, and betadine skin prep.  The left popliteal fossa was interrogated with ultrasound.  The popliteal vein is thrombosed.  Local anesthesia was attained by infiltration of 1% lidocaine.  Under direct sonographic guidance, the vessels punctured with a 21-gauge micropuncture needle.  An image was obtained and stored for the medical record.  A Benson wire was advanced into the right femoral vein and a 7- [place] vascular sheath advanced over the wire.  Through the sheath, a limited left lower extremity venogram was performed.  There is extensive thrombus throughout the popliteal and femoral vein. Using a vertebral catheter and glide wire, the catheter was successfully navigated the common femoral vein.  A venogram demonstrates complete occlusion of the external and common iliac veins.  There is an extensive network of collateral vessels. Thrombus is also seen extending into the great saphenous vein. After an extensive manipulation, the  catheter was finally navigated into the inferior vena cava.  An inferior vena cava gram confirms patency of the cava and IVC filter.  A wire was advanced into the inferior vena cava.  The sheath was pulled back to near the insertion point in the popliteal vein.  A 6 mm balloon was advanced into the popliteal vein above the knee and inflated.  A gentle hand injection of contrast material confirmed reflux into the proximal calf veins which are thrombosed.  A total of 4 mg of TPA was then slowly injected and refluxed into the proximal calf veins.  The sheath was re-advanced into the more distal popliteal vein.  A 90 cm length, 50 cm infusion Unifuse infusion catheter was then advanced over the wire and positioned so that it covered the common, external, common femoral and the majority of the femoral veins.  The sheath and catheter was secured with a post silk suture and a large sterile bandage.  Thrombolysis was initiated at 0.5 mg TNK- tPA per hour.  IMPRESSION:  1.  Left lower extremity venogram demonstrates complete occlusion of the left common and external iliac veins with extensive thrombus from the common femoral vein into the proximal calf veins as well as within the great saphenous vein.  2.  Reflux of 4 mg of TPA into the proximal calf veins.  3.  Placement of a multi side-hole infusion catheter from the common iliac vein into the distal femoral vein.  4.  Initiation of venous thrombolysis.  The patient will return to interventional radiology in 12 - 18 hours for venogram and lysis check.  I expect the patient will  acquire angioplasty, and likely stenting of the left common and external iliac veins.  Signed,  Sterling Big, MD [facility] Radiology   Original Report Authenticated By: Malachy Moan, M.D.   Ir Rande Lawman F/u Eval Art/ven Final Day (ms)  03/27/2013   *RADIOLOGY REPORT*  Clinical Data: Extensive left lower extremity iliofemoral DVT and status  post initiation of transcatheter thrombolytic therapy yesterday.  Tenecteplase infusion had to be discontinued earlier today due to precipitous drop in fibrinogen to dangerously low levels after overnight thrombolytic therapy.  1.  FOLLOW-UP VENOGRAPHY DURING VENOUS THROMBOLYSIS, FINAL DAY. 2.  MECHANICAL THROMBECTOMY OF THE FEMORAL AND ILIAC VEINS. 3.  BALLOON ANGIOPLASTY OF FEMORAL AND ILIAC VEINS.  Comparison:  Imaging on 03/26/2013.  Sedation: 3.0 mg IV Versed; 150 mcg IV Fentanyl.  Total Moderate Sedation Time: 50 minutes.  Contrast:  65 ml Omnipaque-300  Fluoroscopy Time: 7 minutes and 12 seconds.  Procedure:  The procedure, risks, benefits, and alternatives were explained to the patient.  Questions regarding the procedure were encouraged and answered.  The patient understands and consents to the procedure.  The left popliteal fossa sheath and infusion catheter were prepped with Betadine in a sterile fashion, and a sterile drape was applied covering the operative field.  A sterile gown and sterile gloves were used for the procedure.  Venography was initially performed through the infusion catheter followed by a sheath.  Infusion catheter was removed over a guidewire.  Mechanical thrombectomy was performed throughout the femoral and iliac veins with the AngioJet device.  Additional venography was performed through the sheath as well as the AngioJet catheter.  Balloon angioplasty was performed in the thigh at the level of the femoral vein as well as throughout the entire common femoral vein, external iliac vein and common iliac vein with a 7 mm x 4 cm Conquest balloon.  Additional venography was performed.  Additional balloon angioplasty was performed of the common femoral vein, external iliac vein and common iliac vein with a 9 mm x 4 cm Mustang balloon.  Additional venography was performed.  The popliteal vein sheath and guidewire were then removed and hemostasis obtained with manual compression.   Complications: None  Findings: Patency of the native deep veins showed significant improvement compared to pre thrombolytic therapy venography. 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  After AngioJet mechanical thrombectomy, marginal improvement was noted in venous patency.  After balloon angioplasty, further improvement was noted at the level of the femoral vein of the thigh, the common femoral vein and external and common iliac veins. Further dilatation of the common femoral vein and iliac veins were performed with a larger balloon.  There is persistent diffuse stenosis present of the deep venous system.  However, stent placement was not felt necessary as it would require complete reconstruction of the iliac veins as well as the common femoral vein and likely necessitate crossing the inguinal ligament.  IMPRESSION: Improved patency of the deep venous system of the left lower extremity including the femoral veins and iliac veins after overnight thrombolytic therapy.  Thrombolytic therapy had to be discontinued due to precipitous drop in fibrinogen level.  Patency of the veins was further improved today after mechanical thrombectomy and extensive venous angioplasty.  Popliteal vein access was removed upon completion of the procedure.   Original Report Authenticated By: Irish Lack, M.D.  Microbiology: Recent Results (from the past 240 hour(s))  MRSA PCR SCREENING     Status: None   Collection Time    03/26/13  8:25 PM      Result Value Range Status   MRSA by PCR NEGATIVE  NEGATIVE Final   Comment:            The GeneXpert MRSA Assay (FDA     approved for NASAL specimens     only), is one component of a     comprehensive MRSA colonization     surveillance program. It is not     intended to diagnose MRSA     infection nor to guide or      monitor treatment for     MRSA infections.     Labs: Basic Metabolic Panel:  Recent Labs Lab 03/28/13 0345 03/30/13 0430  NA 133* 137  K 3.7 3.9  CL 100 101  CO2 24 28  GLUCOSE 97 91  BUN 8 7  CREATININE 0.60 0.59  CALCIUM 8.2* 8.8   Liver Function Tests:  Recent Labs Lab 03/28/13 0345  AST 19  ALT 11  ALKPHOS 60  BILITOT 0.2*  PROT 5.6*  ALBUMIN 2.4*   No results found for this basename: LIPASE, AMYLASE,  in the last 168 hours No results found for this basename: AMMONIA,  in the last 168 hours CBC:  Recent Labs Lab 03/28/13 0030 03/28/13 0345 03/29/13 0635 04/01/13 0435  WBC 9.1 9.3 11.0* 10.7*  HGB 11.3* 11.4* 11.2* 11.5*  HCT 33.1* 34.4* 34.6* 35.4*  MCV 98.2 98.3 99.4 98.1  PLT 246 255 290 345   Cardiac Enzymes: No results found for this basename: CKTOTAL, CKMB, CKMBINDEX, TROPONINI,  in the last 168 hours BNP: BNP (last 3 results) No results found for this basename: PROBNP,  in the last 8760 hours CBG: No results found for this basename: GLUCAP,  in the last 168 hours     Signed:  Kaeya Schiffer  [facility] 04/03/2013, 1:06 PM

## 2013-04-03 NOTE — Progress Notes (Signed)
Physical Therapy Treatment Patient Details Name: [name] MRN: [id number] DOB: 01-Oct-1965 Today's Date: 04/03/2013 Time: 4098-1191 PT Time Calculation (min): 27 min  PT Assessment / Plan / Recommendation  History of Present Illness 47 y.o. 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   Clinical Impression Patient demonstrates some improvements in activity tolerance but is still significantly limited in overall distance and continues to rely heavily on rw for support. Will continue to work with patient and progress activity as tolerated.  Pt not safe to return to current living situation until patient is able to ambulate safely without rw.       Follow Up Recommendations  Outpatient PT           Equipment Recommendations  Rolling walker with 5" wheels       Frequency Min 3X/week   Progress towards PT Goals Progress towards PT goals: Progressing toward goals  Plan Current plan remains appropriate    Precautions / Restrictions Precautions Precautions: Fall Precaution Comments: pt reports h/o lots of falls   Pertinent Vitals/Pain Minimal pain at this time (3/10) pre medicated    Mobility  Bed Mobility Bed Mobility: Supine to Sit;Sit to Supine Supine to Sit: 6: Modified independent (Device/Increase time) Sit to Supine: 6: Modified independent (Device/Increase time) Transfers Transfers: Sit to Stand;Stand to Sit Sit to Stand: 5: Supervision Stand to Sit: 5: Supervision Details for Transfer Assistance: VCs for hand placement Ambulation/Gait Ambulation/Gait Assistance: 5: Supervision Ambulation Distance (Feet): 160 Feet Assistive device: Rolling walker Gait Pattern: Step-to pattern;Trunk flexed;Antalgic;Decreased dorsiflexion - left;Decreased  weight shift to left Gait velocity: significant decreased  General Gait Details: steady but painful ambulation with heavy reliance on rw    Exercises General Exercises - Lower Extremity Ankle Circles/Pumps: AROM;Left;5 reps Other Exercises Other Exercises: educated on importance of elevation for edema control     PT Goals (current goals can now be found in the care plan section) Acute Rehab PT Goals Patient Stated Goal: to get back to normal PT Goal Formulation: With patient Time For Goal Achievement: 04/13/13  Visit Information  Last PT Received On: 04/03/13 Assistance Needed: +1 History of Present Illness: 47 y.o. 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    Subjective Data  Subjective: I feel like i am doing much better Patient Stated Goal: to get back to normal   Cognition  Cognition Arousal/Alertness: Awake/alert Behavior During Therapy: WFL for tasks assessed/performed Overall Cognitive Status: History of cognitive impairments - at baseline (most likely baseline functioning)    Balance  Static Sitting Balance Static Sitting - Balance Support: Bilateral upper extremity supported;Feet supported Static Sitting - Level of Assistance: 7: Independent  End of Session PT - End of Session Equipment Utilized During Treatment: Gait belt Activity Tolerance: Patient limited by pain Patient left: in bed;with call bell/phone within reach Nurse Communication: Mobility status   GP     Fabio Asa 04/03/2013, 3:35 PM Charlotte Crumb, PT DPT  [phone]

## 2013-04-04 LAB — CBC
HCT: 37.8 % — ABNORMAL LOW (ref 39.0–52.0)
MCHC: 31.7 g/dL (ref 30.0–36.0)
MCV: 99 fL (ref 78.0–100.0)
Platelets: 458 10*3/uL — ABNORMAL HIGH (ref 150–400)
RDW: 13.8 % (ref 11.5–15.5)
WBC: 7.4 10*3/uL (ref 4.0–10.5)

## 2013-04-04 MED ORDER — DOXYCYCLINE HYCLATE 100 MG PO TABS
100.0000 mg | ORAL_TABLET | Freq: Two times a day (BID) | ORAL | Status: DC
  Administered 2013-04-04 – 2013-04-06 (×5): 100 mg via ORAL
  Filled 2013-04-04 (×7): qty 1

## 2013-04-04 NOTE — Discharge Summary (Signed)
Physician Discharge Summary  [name] WUX:[id number] DOB: 11/08/1965 DOA: 03/26/2013  PCP: Default, Provider, MD  Admit date: 03/26/2013 Discharge date: 04/04/2013  Time spent: 30 minutes  Recommendations for Outpatient Follow-up:  1. Follow up with PCP in one week.  2. FOLLOW UP with Dr Gaylyn Rong in 2 weeks.   Discharge Diagnoses:  Principal Problem:   Phlegmasia cerulea dolens, left lower extremity Active Problems:   Alcohol abuse   Tobacco abuse   Lupus anticoagulant disorder   Discharge Condition: improved.   Diet recommendation: regular  Filed Weights   03/27/13 0015 04/03/13 0617  Weight: 86.2 kg (190 lb 0.6 oz) 87.4 kg (192 lb 10.9 oz)    Brief narrative:  47 y.o. 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 He is supposed to be on chronic Lovenox therapy, 90 mg twice a day for his prior history of DVT. He had a 5 day interruption in his Lovenox secondary to being incarcerated (released on 03/22/13). The patient stated he developed the redness, swelling and pain 4 days prior to his admission. He was evaluated in the ER but they could not do a Doppler that night and he was released and instructed to continue his usual dose of Lovenox (which he resumed after being released from jail) and to return in one week if he continues to have problems with swelling. Patient had massive swelling of his left lower extremity and explained he had to have lysis of a thrombus in the past. His first blood clot was approximately 4 years ago. He does not have a PCP, but sees Dr. Gaylyn Rong. 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 Plan for disposition to ALF on Monday. Meanwhile his left leg is tender and erythematous, with some  cellulitis changes started him on IV vancomycin. He will be transitioned to po doxycycline on discharge.   Assessment/Plan:  Phlegmasia cerulea dolens, left lower extremity  Now s/p IR intervention with TPA and TNK infusion - returned to IR for angioplasty and angiojet as TNK had to be stopped due to falling fibrinogen level - transition back to lovenox tody - review of office visits with Dr. Gaylyn Rong reveals that compliance was an issue, and that pt is not felt to have "failed" prior tx attempts with coumadin or Xarelto, but rather to have been noncompliant - discussed in detail about compliance, he reported that he did not have medicaid previously, hence has been missing medications. Now he has medicaid and will be able to obtain meds and will be compliant. Agreed to be on xarelto rather than lovenox injections.  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      Consultants:  IR  Procedures:  7/20 - Venous doppler L LE - extensive, acute, occlusive DVT noted throughout the left lower extremity  7/20 - LLE venogram and cavogram - reflux of TPA into proximal calf veins - initiation of venous lysis at 0.5 mg TNK/hr  7/21 - FOLLOW-UP VENOGRAPHY - MECHANICAL THROMBECTOMY OF THE FEMORAL AND ILIAC VEINS - BALLOON ANGIOPLASTY OF FEMORAL AND ILIAC VEINS   Discharge Exam: Filed Vitals:   04/03/13 1400 04/03/13 2227 04/04/13 0442 04/04/13 1354  BP: 98/59 103/55  106/64 92/50  Pulse: 68 61 66 72  Temp: 98.1 F (36.7 C) 99.2 F (37.3 C) 99 F (37.2 C) 98.2 F (36.8 C)  TempSrc: Oral Oral Oral Oral  Resp: 18 18 20 18   Height:      Weight:      SpO2: 99% 100% 99% 99%    General: No acute respiratory distress  Lungs: Clear to auscultation bilaterally without wheezes or crackles  Cardiovascular: Regular rate and rhythm without murmur gallop or rub normal S1 and S2  Abdomen: Nontender, nondistended, soft, bowel sounds positive, no rebound, no ascites, no appreciable mass  Extremities: No significant cyanosis, clubbing; 3++ edema L LE w/ less severe erythema - trace edema only R LE  Discharge Instructions       Future Appointments Provider Department Dept Phone   04/21/2013 8:30 AM [facility] [place] [facility] [phone]   04/21/2013 9:00 AM [facility] Provider [address] [place] [facility] [phone]       Medication List    STOP taking these medications       enoxaparin 100 MG/ML injection  Commonly known as:  LOVENOX      TAKE these medications       doxycycline 50 MG capsule  Commonly known as:  VIBRAMYCIN  Take 2 capsules (100 mg total) by mouth 2 (two) times daily.     folic acid 1 MG tablet  Commonly known as:  FOLVITE  Take 1 tablet (1 mg total) by mouth daily.     multivitamin with minerals Tabs  Take 1 tablet by mouth daily.     nicotine 21 mg/24hr patch  Commonly known as:  NICODERM CQ - dosed in mg/24 hours  Place 1 patch onto the skin daily.     oxyCODONE-acetaminophen 5-325 MG per tablet  Commonly known as:  PERCOCET/ROXICET  Take 2 tablets by mouth every 6 (six) hours as needed for pain.     Rivaroxaban 15 MG Tabs tablet  Commonly known as:  XARELTO  Take 1 tablet (15 mg total) by mouth 2 (two) times daily with a meal.     Rivaroxaban 20 MG Tabs  Commonly known as:  XARELTO  Take 1 tablet (20 mg total) by mouth daily.  Start taking on:   04/20/2013     thiamine 100 MG tablet  Take 1 tablet (100 mg total) by mouth daily.     traZODone 50 MG tablet  Commonly known as:  DESYREL  Take 50 mg by mouth at bedtime as needed for sleep.       No Known Allergies Follow-up Information   Follow up with Jearld Lesch, MD. Schedule an appointment as soon as possible for a visit in 1 week.   Contact information:   [address] [place] [address] [phone]        The results of significant diagnostics from this hospitalization (including imaging, microbiology, ancillary and laboratory) are listed below for reference.  Significant Diagnostic Studies: Dg Chest 2 View  03/22/2013   *RADIOLOGY REPORT*  Clinical Data: Shortness of breath with exertion; history of smoking.  Left leg pain and swelling.  CHEST - 2 VIEW  Comparison: Chest radiograph and CTA of the chest performed 12/11/2012  Findings: The lungs are well-aerated.  Mild chronic peribronchial thickening is seen.  There is no evidence of focal opacification, pleural effusion or pneumothorax.  A stable calcified granuloma is noted at the left midlung zone.  The heart is normal in size; the mediastinal contour is within normal limits.  No acute osseous abnormalities are seen.  IMPRESSION: Mild chronic peribronchial thickening seen; no acute cardiopulmonary process identified.   Original Report Authenticated By: Tonia Ghent, M.D.   Ct Head Wo Contrast  03/05/2013   *RADIOLOGY REPORT*  Clinical Data: The patient complains of "aneurysm".  Mental status changes.  CT HEAD WITHOUT CONTRAST  Technique:  Contiguous axial images were obtained from the base of the skull through the vertex without contrast.  Comparison: 08/04/2012  Findings: Bone windows demonstrate mucous retention cyst or polyp in the left sphenoid sinus.  Minimal motion degradation. Clear mastoid air cells.  Soft tissue windows demonstrate mildly advanced for age cerebral atrophy.  Mega cisterna magna. No   mass lesion, hemorrhage, hydrocephalus, acute infarct, intra-axial, or extra-axial fluid collection.  IMPRESSION:  1. No acute intracranial abnormality. 2.  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   Original Report Authenticated By: Jeronimo Greaves, M.D.   Ir Veno/ext/uni Left  03/26/2013   *RADIOLOGY REPORT*  IR LEFT LOWER EXTREMITY VENOGRAM; IR VENOGRAM IVC; IR INFUSION OF VENOUS THROMBOLYTIC INITIAL  Date: 03/26/2013  Clinical History: 47 year old male with a 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  He has a history of prior vena thromboembolism with a successful bilateral lower extremity lysis procedure done in May 2013.  He has been on Lovenox therapy which has been successful for until recently when he was incarcerated and missed 5 days and is therapeutic regimen.  He began experiencing progressive left lower extremity swelling shortly thereafter.  He now presents and extremity with massive left lower extremity swelling and extensive superficial and deep venous thrombus.  Procedures Performed: 1. Ultrasound-guided puncture of the left popliteal vein 2.  Dilated left lower extremity venogram 3.  Successful catheterization of the inferior vena cava 4.  Inferior vena cava gram 5.  Balloon occluded reflux of TPA into the proximal calf veins 6.  Placement of multi side-hole infusion catheter initiation of venous thrombolysis  Interventional Radiologist:  Sterling Big, MD  Sedation: Moderate (conscious) sedation was used.  Six mg Versed, 200 mcg Fentanyl were administered intravenously.  The patient's vital signs were monitored continuously by radiology nursing throughout the procedure.  Sedation Time: 15 minutes  Fluoroscopy time: 16 minutes  Contrast volume: 40 ml Omnipaque-300  Additional medications:  4 mg TPA administered intravenously  PROCEDURE/FINDINGS:   Informed consent was obtained from the patient following explanation of the procedure, risks, benefits and alternatives. The patient  understands, agrees and consents for the procedure. All questions were addressed. A time out was performed.  Maximal barrier sterile technique utilized including caps, mask, sterile gowns, sterile gloves, large sterile drape, hand hygiene, and betadine skin prep.  The left popliteal fossa was interrogated with ultrasound.  The popliteal vein is thrombosed.  Local anesthesia was attained by infiltration of 1% lidocaine.  Under direct sonographic guidance, the vessels punctured with a 21-gauge micropuncture needle.  An image was obtained and stored for  the medical record.  A Benson wire was advanced into the right femoral vein and a 7- [place] vascular sheath advanced over the wire.  Through the sheath, a limited left lower extremity venogram was performed.  There is extensive thrombus throughout the popliteal and femoral vein. Using a vertebral catheter and glide wire, the catheter was successfully navigated the common femoral vein.  A venogram demonstrates complete occlusion of the external and common iliac veins.  There is an extensive network of collateral vessels. Thrombus is also seen extending into the great saphenous vein. After an extensive manipulation, the catheter was finally navigated into the inferior vena cava.  An inferior vena cava gram confirms patency of the cava and IVC filter.  A wire was advanced into the inferior vena cava.  The sheath was pulled back to near the insertion point in the popliteal vein.  A 6 mm balloon was advanced into the popliteal vein above the knee and inflated.  A gentle hand injection of contrast material confirmed reflux into the proximal calf veins which are thrombosed.  A total of 4 mg of TPA was then slowly injected and refluxed into the proximal calf veins.  The sheath was re-advanced into the more distal popliteal vein.  A 90 cm length, 50 cm infusion Unifuse infusion catheter was then advanced over the wire and positioned so that it covered the common, external,  common femoral and the majority of the femoral veins.  The sheath and catheter was secured with a post silk suture and a large sterile bandage.  Thrombolysis was initiated at 0.5 mg TNK- tPA per hour.  IMPRESSION:  1.  Left lower extremity venogram demonstrates complete occlusion of the left common and external iliac veins with extensive thrombus from the common femoral vein into the proximal calf veins as well as within the great saphenous vein.  2.  Reflux of 4 mg of TPA into the proximal calf veins.  3.  Placement of a multi side-hole infusion catheter from the common iliac vein into the distal femoral vein.  4.  Initiation of venous thrombolysis.  The patient will return to interventional radiology in 12 - 18 hours for venogram and lysis check.  I expect the patient will acquire angioplasty, and likely stenting of the left common and external iliac veins.  Signed,  Sterling Big, MD [facility]   Original Report Authenticated By: Malachy Moan, M.D.   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  He has a history of prior vena thromboembolism with a successful bilateral lower extremity lysis procedure done in May 2013.  He has been on Lovenox therapy which has been successful for until recently when he was incarcerated and missed 5 days and is therapeutic regimen.  He began experiencing progressive left lower extremity swelling shortly thereafter.  He now presents and extremity with massive left lower extremity swelling and extensive superficial and deep venous thrombus.  Procedures Performed: 1. Ultrasound-guided puncture of the left popliteal vein 2.  Dilated left lower extremity venogram 3.  Successful catheterization  of the inferior vena cava 4.  Inferior vena cava gram 5.  Balloon occluded reflux of TPA into the proximal calf veins 6.  Placement of multi side-hole infusion catheter initiation of venous thrombolysis  Interventional Radiologist:  Sterling Big, MD  Sedation: Moderate (conscious) sedation was used.  Six mg Versed, 200 mcg Fentanyl were administered intravenously.  The patient's vital signs were monitored continuously by radiology nursing throughout the procedure.  Sedation Time: 15 minutes  Fluoroscopy time: 16 minutes  Contrast volume: 40 ml Omnipaque-300  Additional medications:  4 mg TPA administered intravenously  PROCEDURE/FINDINGS:   Informed consent was obtained from the patient following explanation of the procedure, risks, benefits and alternatives. The patient understands, agrees and consents for the procedure. All questions were addressed. A time out was performed.  Maximal barrier sterile technique utilized including caps, mask, sterile gowns, sterile gloves, large sterile drape, hand hygiene, and betadine skin prep.  The left popliteal fossa was interrogated with ultrasound.  The popliteal vein is thrombosed.  Local anesthesia was attained by infiltration of 1% lidocaine.  Under direct sonographic guidance, the vessels punctured with a 21-gauge micropuncture needle.  An image was obtained and stored for the medical record.  A Benson wire was advanced into the right femoral vein and a 7- [place] vascular sheath advanced over the wire.  Through the sheath, a limited left lower extremity venogram was performed.  There is extensive thrombus throughout the popliteal and femoral vein. Using a vertebral catheter and glide wire, the catheter was successfully navigated the common femoral vein.  A venogram demonstrates complete occlusion of the external and common iliac veins.  There is an extensive network of collateral vessels. Thrombus is also seen extending into the great saphenous vein. After an  extensive manipulation, the catheter was finally navigated into the inferior vena cava.  An inferior vena cava gram confirms patency of the cava and IVC filter.  A wire was advanced into the inferior vena cava.  The sheath was pulled back to near the insertion point in the popliteal vein.  A 6 mm balloon was advanced into the popliteal vein above the knee and inflated.  A gentle hand injection of contrast material confirmed reflux into the proximal calf veins which are thrombosed.  A total of 4 mg of TPA was then slowly injected and refluxed into the proximal calf veins.  The sheath was re-advanced into the more distal popliteal vein.  A 90 cm length, 50 cm infusion Unifuse infusion catheter was then advanced over the wire and positioned so that it covered the common, external, common femoral and the majority of the femoral veins.  The sheath and catheter was secured with a post silk suture and a large sterile bandage.  Thrombolysis was initiated at 0.5 mg TNK- tPA per hour.  IMPRESSION:  1.  Left lower extremity venogram demonstrates complete occlusion of the left common and external iliac veins with extensive thrombus from the common femoral vein into the proximal calf veins as well as within the great saphenous vein.  2.  Reflux of 4 mg of TPA into the proximal calf veins.  3.  Placement of a multi side-hole infusion catheter from the common iliac vein into the distal femoral vein.  4.  Initiation of venous thrombolysis.  The patient will return to interventional radiology in 12 - 18 hours for venogram and lysis check.  I expect the patient will acquire angioplasty, and likely stenting of the left common and external iliac veins.  Signed,  Sterling Big, MD [facility]iology   Original Report Authenticated By: Malachy Moan, M.D.   Ir Pta Venous Left  03/27/2013   *RADIOLOGY REPORT*  Clinical Data: Extensive  left lower extremity iliofemoral DVT and  status post initiation of transcatheter thrombolytic therapy yesterday.  Tenecteplase infusion had to be discontinued earlier today due to precipitous drop in fibrinogen to dangerously low levels after overnight thrombolytic therapy.  1.  FOLLOW-UP VENOGRAPHY DURING VENOUS THROMBOLYSIS, FINAL DAY. 2.  MECHANICAL THROMBECTOMY OF THE FEMORAL AND ILIAC VEINS. 3.  BALLOON ANGIOPLASTY OF FEMORAL AND ILIAC VEINS.  Comparison:  Imaging on 03/26/2013.  Sedation: 3.0 mg IV Versed; 150 mcg IV Fentanyl.  Total Moderate Sedation Time: 50 minutes.  Contrast:  65 ml Omnipaque-300  Fluoroscopy Time: 7 minutes and 12 seconds.  Procedure:  The procedure, risks, benefits, and alternatives were explained to the patient.  Questions regarding the procedure were encouraged and answered.  The patient understands and consents to the procedure.  The left popliteal fossa sheath and infusion catheter were prepped with Betadine in a sterile fashion, and a sterile drape was applied covering the operative field.  A sterile gown and sterile gloves were used for the procedure.  Venography was initially performed through the infusion catheter followed by a sheath.  Infusion catheter was removed over a guidewire.  Mechanical thrombectomy was performed throughout the femoral and iliac veins with the AngioJet device.  Additional venography was performed through the sheath as well as the AngioJet catheter.  Balloon angioplasty was performed in the thigh at the level of the femoral vein as well as throughout the entire common femoral vein, external iliac vein and common iliac vein with a 7 mm x 4 cm Conquest balloon.  Additional venography was performed.  Additional balloon angioplasty was performed of the common femoral vein, external iliac vein and common iliac vein with a 9 mm x 4 cm Mustang balloon.  Additional venography was performed.  The popliteal vein sheath and guidewire were then removed and hemostasis obtained with manual compression.   Complications: None  Findings: Patency of the native deep veins showed significant improvement compared to pre thrombolytic therapy venography. 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  After AngioJet mechanical thrombectomy, marginal improvement was noted in venous patency.  After balloon angioplasty, further improvement was noted at the level of the femoral vein of the thigh, the common femoral vein and external and common iliac veins. Further dilatation of the common femoral vein and iliac veins were performed with a larger balloon.  There is persistent diffuse stenosis present of the deep venous system.  However, stent placement was not felt necessary as it would require complete reconstruction of the iliac veins as well as the common femoral vein and likely necessitate crossing the inguinal ligament.  IMPRESSION: Improved patency of the deep venous system of the left lower extremity including the femoral veins and iliac veins after overnight thrombolytic therapy.  Thrombolytic therapy had to be discontinued due to precipitous drop in fibrinogen level.  Patency of the veins was further improved today after mechanical thrombectomy and extensive venous angioplasty.  Popliteal vein access was removed upon completion of the procedure.   Original Report Authenticated By: Irish Lack, M.D.   Ir Thrombect Veno Mech Mod Sed  03/27/2013   *RADIOLOGY REPORT*  Clinical Data: Extensive left lower extremity iliofemoral DVT and status post initiation of transcatheter thrombolytic therapy yesterday.  Tenecteplase infusion had to be discontinued earlier today due to precipitous drop in fibrinogen to dangerously low levels after overnight thrombolytic therapy.  1.  FOLLOW-UP VENOGRAPHY DURING VENOUS THROMBOLYSIS, FINAL DAY. 2.  MECHANICAL THROMBECTOMY OF THE FEMORAL AND  ILIAC VEINS. 3.  BALLOON ANGIOPLASTY OF FEMORAL AND ILIAC VEINS.  Comparison:  Imaging on 03/26/2013.  Sedation: 3.0 mg IV Versed; 150 mcg IV Fentanyl.  Total Moderate Sedation Time: 50 minutes.  Contrast:  65 ml Omnipaque-300  Fluoroscopy Time: 7 minutes and 12 seconds.  Procedure:  The procedure, risks, benefits, and alternatives were explained to the patient.  Questions regarding the procedure were encouraged and answered.  The patient understands and consents to the procedure.  The left popliteal fossa sheath and infusion catheter were prepped with Betadine in a sterile fashion, and a sterile drape was applied covering the operative field.  A sterile gown and sterile gloves were used for the procedure.  Venography was initially performed through the infusion catheter followed by a sheath.  Infusion catheter was removed over a guidewire.  Mechanical thrombectomy was performed throughout the femoral and iliac veins with the AngioJet device.  Additional venography was performed through the sheath as well as the AngioJet catheter.  Balloon angioplasty was performed in the thigh at the level of the femoral vein as well as throughout the entire common femoral vein, external iliac vein and common iliac vein with a 7 mm x 4 cm Conquest balloon.  Additional venography was performed.  Additional balloon angioplasty was performed of the common femoral vein, external iliac vein and common iliac vein with a 9 mm x 4 cm Mustang balloon.  Additional venography was performed.  The popliteal vein sheath and guidewire were then removed and hemostasis obtained with manual compression.  Complications: None  Findings: Patency of the native deep veins showed significant improvement compared to pre thrombolytic therapy venography. 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  After  AngioJet mechanical thrombectomy, marginal improvement was noted in venous patency.  After balloon angioplasty, further improvement was noted at the level of the femoral vein of the thigh, the common femoral vein and external and common iliac veins. Further dilatation of the common femoral vein and iliac veins were performed with a larger balloon.  There is persistent diffuse stenosis present of the deep venous system.  However, stent placement was not felt necessary as it would require complete reconstruction of the iliac veins as well as the common femoral vein and likely necessitate crossing the inguinal ligament.  IMPRESSION: Improved patency of the deep venous system of the left lower extremity including the femoral veins and iliac veins after overnight thrombolytic therapy.  Thrombolytic therapy had to be discontinued due to precipitous drop in fibrinogen level.  Patency of the veins was further improved today after mechanical thrombectomy and extensive venous angioplasty.  Popliteal vein access was removed upon completion of the procedure.   Original Report Authenticated By: Irish Lack, M.D.   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  He has a history of prior vena thromboembolism with a successful bilateral lower extremity lysis procedure done in May 2013.  He has been on Lovenox therapy which has been successful for until recently when he was incarcerated and missed 5 days and is therapeutic regimen.  He began experiencing progressive left lower extremity swelling shortly thereafter.  He now presents and extremity with massive left lower extremity swelling and extensive superficial and deep venous thrombus.  Procedures Performed: 1. Ultrasound-guided puncture of  the left popliteal vein 2.  Dilated left lower extremity venogram 3.  Successful catheterization of the inferior vena cava 4.  Inferior vena cava gram 5.  Balloon occluded reflux of TPA into the proximal calf veins 6.  Placement of multi side-hole infusion catheter initiation of venous thrombolysis  Interventional Radiologist:  Sterling Big, MD  Sedation: Moderate (conscious) sedation was used.  Six mg Versed, 200 mcg Fentanyl were administered intravenously.  The patient's vital signs were monitored continuously by radiology nursing throughout the procedure.  Sedation Time: 15 minutes  Fluoroscopy time: 16 minutes  Contrast volume: 40 ml Omnipaque-300  Additional medications:  4 mg TPA administered intravenously  PROCEDURE/FINDINGS:   Informed consent was obtained from the patient following explanation of the procedure, risks, benefits and alternatives. The patient understands, agrees and consents for the procedure. All questions were addressed. A time out was performed.  Maximal barrier sterile technique utilized including caps, mask, sterile gowns, sterile gloves, large sterile drape, hand hygiene, and betadine skin prep.  The left popliteal fossa was interrogated with ultrasound.  The popliteal vein is thrombosed.  Local anesthesia was attained by infiltration of 1% lidocaine.  Under direct sonographic guidance, the vessels punctured with a 21-gauge micropuncture needle.  An image was obtained and stored for the medical record.  A Benson wire was advanced into the right femoral vein and a 7- [place] vascular sheath advanced over the wire.  Through the sheath, a limited left lower extremity venogram was performed.  There is extensive thrombus throughout the popliteal and femoral vein. Using a vertebral catheter and glide wire, the catheter was successfully navigated the common femoral vein.  A venogram demonstrates complete occlusion of the external and common iliac veins.  There is an extensive network of  collateral vessels. Thrombus is also seen extending into the great saphenous vein. After an extensive manipulation, the catheter was finally navigated into the inferior vena cava.  An inferior vena cava gram confirms patency of the cava and IVC filter.  A wire was advanced into the inferior vena cava.  The sheath was pulled back to near the insertion point in the popliteal vein.  A 6 mm balloon was advanced into the popliteal vein above the knee and inflated.  A gentle hand injection of contrast material confirmed reflux into the proximal calf veins which are thrombosed.  A total of 4 mg of TPA was then slowly injected and refluxed into the proximal calf veins.  The sheath was re-advanced into the more distal popliteal vein.  A 90 cm length, 50 cm infusion Unifuse infusion catheter was then advanced over the wire and positioned so that it covered the common, external, common femoral and the majority of the femoral veins.  The sheath and catheter was secured with a post silk suture and a large sterile bandage.  Thrombolysis was initiated at 0.5 mg TNK- tPA per hour.  IMPRESSION:  1.  Left lower extremity venogram demonstrates complete occlusion of the left common and external iliac veins with extensive thrombus from the common femoral vein into the proximal calf veins as well as within the great saphenous vein.  2.  Reflux of 4 mg of TPA into the proximal calf veins.  3.  Placement of a multi side-hole infusion catheter from the  common iliac vein into the distal femoral vein.  4.  Initiation of venous thrombolysis.  The patient will return to interventional radiology in 12 - 18 hours for venogram and lysis check.  I expect the patient will acquire angioplasty, and likely stenting of the left common and external iliac veins.  Signed,  Sterling Big, MD [facility]   Original Report Authenticated By: Malachy Moan, M.D.   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  He has a history of prior vena thromboembolism with a successful bilateral lower extremity lysis procedure done in May 2013.  He has been on Lovenox therapy which has been successful for until recently when he was incarcerated and missed 5 days and is therapeutic regimen.  He began experiencing progressive left lower extremity swelling shortly thereafter.  He now presents and extremity with massive left lower extremity swelling and extensive superficial and deep venous thrombus.  Procedures Performed: 1. Ultrasound-guided puncture of the left popliteal vein 2.  Dilated left lower extremity venogram 3.  Successful catheterization of the inferior vena cava 4.  Inferior vena cava gram 5.  Balloon occluded reflux of TPA into the proximal calf veins 6.  Placement of multi side-hole infusion catheter initiation of venous thrombolysis  Interventional Radiologist:  Sterling Big, MD  Sedation: Moderate (conscious) sedation was used.  Six mg Versed, 200 mcg Fentanyl were administered intravenously.  The patient's vital signs were monitored continuously by radiology nursing throughout the procedure.  Sedation Time: 15 minutes  Fluoroscopy time: 16 minutes  Contrast volume: 40 ml Omnipaque-300  Additional medications:  4 mg TPA administered intravenously  PROCEDURE/FINDINGS:   Informed consent was obtained from the patient following explanation of the procedure, risks, benefits and alternatives. The patient understands, agrees and consents for the procedure. All questions were addressed. A time out was performed.  Maximal barrier sterile technique utilized including caps, mask, sterile gowns, sterile gloves, large sterile drape, hand hygiene, and betadine  skin prep.  The left popliteal fossa was interrogated with ultrasound.  The popliteal vein is thrombosed.  Local anesthesia was attained by infiltration of 1% lidocaine.  Under direct sonographic guidance, the vessels punctured with a 21-gauge micropuncture needle.  An image was obtained and stored for the medical record.  A Benson wire was advanced into the right femoral vein and a 7- [place] vascular sheath advanced over the wire.  Through the sheath, a limited left lower extremity venogram was performed.  There is extensive thrombus throughout the popliteal and femoral vein. Using a vertebral catheter and glide wire, the catheter was successfully navigated the common femoral vein.  A venogram demonstrates complete occlusion of the external and common iliac veins.  There is an extensive network of collateral vessels. Thrombus is also seen extending into the great saphenous vein. After an extensive manipulation, the catheter was finally navigated into the inferior vena cava.  An inferior vena cava gram confirms patency of the cava and IVC filter.  A wire was advanced into the inferior vena cava.  The sheath was pulled back to near the insertion point in the popliteal vein.  A 6 mm balloon was advanced into the popliteal vein above the knee and inflated.  A gentle hand injection of contrast material  confirmed reflux into the proximal calf veins which are thrombosed.  A total of 4 mg of TPA was then slowly injected and refluxed into the proximal calf veins.  The sheath was re-advanced into the more distal popliteal vein.  A 90 cm length, 50 cm infusion Unifuse infusion catheter was then advanced over the wire and positioned so that it covered the common, external, common femoral and the majority of the femoral veins.  The sheath and catheter was secured with a post silk suture and a large sterile bandage.  Thrombolysis was initiated at 0.5 mg TNK- tPA per hour.  IMPRESSION:  1.  Left lower extremity venogram  demonstrates complete occlusion of the left common and external iliac veins with extensive thrombus from the common femoral vein into the proximal calf veins as well as within the great saphenous vein.  2.  Reflux of 4 mg of TPA into the proximal calf veins.  3.  Placement of a multi side-hole infusion catheter from the common iliac vein into the distal femoral vein.  4.  Initiation of venous thrombolysis.  The patient will return to interventional radiology in 12 - 18 hours for venogram and lysis check.  I expect the patient will acquire angioplasty, and likely stenting of the left common and external iliac veins.  Signed,  Sterling Big, MD [facility] Radiology   Original Report Authenticated By: Malachy Moan, M.D.   Ir Rande Lawman F/u Eval Art/ven Final Day (ms)  03/27/2013   *RADIOLOGY REPORT*  Clinical Data: Extensive left lower extremity iliofemoral DVT and status post initiation of transcatheter thrombolytic therapy yesterday.  Tenecteplase infusion had to be discontinued earlier today due to precipitous drop in fibrinogen to dangerously low levels after overnight thrombolytic therapy.  1.  FOLLOW-UP VENOGRAPHY DURING VENOUS THROMBOLYSIS, FINAL DAY. 2.  MECHANICAL THROMBECTOMY OF THE FEMORAL AND ILIAC VEINS. 3.  BALLOON ANGIOPLASTY OF FEMORAL AND ILIAC VEINS.  Comparison:  Imaging on 03/26/2013.  Sedation: 3.0 mg IV Versed; 150 mcg IV Fentanyl.  Total Moderate Sedation Time: 50 minutes.  Contrast:  65 ml Omnipaque-300  Fluoroscopy Time: 7 minutes and 12 seconds.  Procedure:  The procedure, risks, benefits, and alternatives were explained to the patient.  Questions regarding the procedure were encouraged and answered.  The patient understands and consents to the procedure.  The left popliteal fossa sheath and infusion catheter were prepped with Betadine in a sterile fashion, and a sterile drape was applied covering the operative field.  A sterile gown and  sterile gloves were used for the procedure.  Venography was initially performed through the infusion catheter followed by a sheath.  Infusion catheter was removed over a guidewire.  Mechanical thrombectomy was performed throughout the femoral and iliac veins with the AngioJet device.  Additional venography was performed through the sheath as well as the AngioJet catheter.  Balloon angioplasty was performed in the thigh at the level of the femoral vein as well as throughout the entire common femoral vein, external iliac vein and common iliac vein with a 7 mm x 4 cm Conquest balloon.  Additional venography was performed.  Additional balloon angioplasty was performed of the common femoral vein, external iliac vein and common iliac vein with a 9 mm x 4 cm Mustang balloon.  Additional venography was performed.  The popliteal vein sheath and guidewire were then removed and hemostasis obtained with manual compression.  Complications: None  Findings: Patency of the native deep veins showed significant improvement compared to  pre thrombolytic therapy venography. 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  After AngioJet mechanical thrombectomy, marginal improvement was noted in venous patency.  After balloon angioplasty, further improvement was noted at the level of the femoral vein of the thigh, the common femoral vein and external and common iliac veins. Further dilatation of the common femoral vein and iliac veins were performed with a larger balloon.  There is persistent diffuse stenosis present of the deep venous system.  However, stent placement was not felt necessary as it would require complete reconstruction of the iliac veins as well as the common femoral vein and likely necessitate crossing the inguinal ligament.  IMPRESSION: Improved patency of the deep venous  system of the left lower extremity including the femoral veins and iliac veins after overnight thrombolytic therapy.  Thrombolytic therapy had to be discontinued due to precipitous drop in fibrinogen level.  Patency of the veins was further improved today after mechanical thrombectomy and extensive venous angioplasty.  Popliteal vein access was removed upon completion of the procedure.   Original Report Authenticated By: Irish Lack, M.D.    Microbiology: Recent Results (from the past 240 hour(s))  MRSA PCR SCREENING     Status: None   Collection Time    03/26/13  8:25 PM      Result Value Range Status   MRSA by PCR NEGATIVE  NEGATIVE Final   Comment:            The GeneXpert MRSA Assay (FDA     approved for NASAL specimens     only), is one component of a     comprehensive MRSA colonization     surveillance program. It is not     intended to diagnose MRSA     infection nor to guide or     monitor treatment for     MRSA infections.     Labs: Basic Metabolic Panel:  Recent Labs Lab 03/30/13 0430  NA 137  K 3.9  CL 101  CO2 28  GLUCOSE 91  BUN 7  CREATININE 0.59  CALCIUM 8.8   Liver Function Tests: No results found for this basename: AST, ALT, ALKPHOS, BILITOT, PROT, ALBUMIN,  in the last 168 hours No results found for this basename: LIPASE, AMYLASE,  in the last 168 hours No results found for this basename: AMMONIA,  in the last 168 hours CBC:  Recent Labs Lab 03/29/13 0635 04/01/13 0435 04/04/13 0605  WBC 11.0* 10.7* 7.4  HGB 11.2* 11.5* 12.0*  HCT 34.6* 35.4* 37.8*  MCV 99.4 98.1 99.0  PLT 290 345 458*   Cardiac Enzymes: No results found for this basename: CKTOTAL, CKMB, CKMBINDEX, TROPONINI,  in the last 168 hours BNP: BNP (last 3 results) No results found for this basename: PROBNP,  in the last 8760 hours CBG: No results found for this basename: GLUCAP,  in the last 168 hours     Signed:  Donaciano Range  [facility] 04/04/2013, 7:09  PM  Addendum: pt's pain is better. Leg swelling and erythema decreased . Stopped vancomycin and started him on oral doxycycline.   Kathlen Mody,  [phone].

## 2013-04-04 NOTE — Progress Notes (Signed)
CSW consulting with [facility] Silos in assistance with LOG for ALF. CSW has completed FL2 & will continue to follow and assist with return.   Maree Krabbe, MSW, Theresia Majors [phone]

## 2013-04-04 NOTE — Progress Notes (Signed)
ANTIBIOTIC CONSULT NOTE - FOLLOW UP  Pharmacy Consult for vancomycin Indication: cellulitis  No Known Allergies  Patient Measurements: Height: 6\' 2"  (188 cm) Weight: 192 lb 10.9 oz (87.4 kg) IBW/kg (Calculated) : 82.2 Adjusted Body Weight:   Vital Signs: Temp: 99 F (37.2 C) (07/29 0442) Temp src: Oral (07/29 0442) BP: 106/64 mmHg (07/29 0442) Pulse Rate: 66 (07/29 0442) Intake/Output from previous day: 07/28 0701 - 07/29 0700 In: 480 [P.O.:480] Out: 2400 [Urine:2400] Intake/Output from this shift:    Labs:  Recent Labs  04/04/13 0605  WBC 7.4  HGB 12.0*  PLT 458*   Estimated Creatinine Clearance: 132.7 ml/min (by C-G formula based on Cr of 0.59). No results found for this basename: VANCOTROUGH, Leodis Binet, VANCORANDOM, GENTTROUGH, GENTPEAK, GENTRANDOM, TOBRATROUGH, TOBRAPEAK, TOBRARND, AMIKACINPEAK, AMIKACINTROU, AMIKACIN,  in the last 72 hours   Microbiology: Recent Results (from the past 720 hour(s))  MRSA PCR SCREENING     Status: None   Collection Time    03/26/13  8:25 PM      Result Value Range Status   MRSA by PCR NEGATIVE  NEGATIVE Final   Comment:            The GeneXpert MRSA Assay (FDA     approved for NASAL specimens     only), is one component of a     comprehensive MRSA colonization     surveillance program. It is not     intended to diagnose MRSA     infection nor to guide or     monitor treatment for     MRSA infections.    Anti-infectives   Start     Dose/Rate Route Frequency Ordered Stop   04/03/13 0000  doxycycline (VIBRAMYCIN) 50 MG capsule     100 mg Oral 2 times daily 04/03/13 1313     04/01/13 1300  vancomycin (VANCOCIN) IVPB 1000 mg/200 mL premix     1,000 mg 200 mL/hr over 60 Minutes Intravenous Every 12 hours 04/01/13 1203     04/01/13 1200  doxycycline (VIBRA-TABS) tablet 100 mg  Status:  Discontinued     100 mg Oral Every 12 hours 04/01/13 1150 04/01/13 1152      Assessment: 47 yo male with cellulitis is currently on  vancomycin.  UOP 1.7 ml/kg/hr  Goal of Therapy:  Vancomycin trough level 10-15 mcg/ml  Plan:  1) Continue vancomycin 1g iv q12h 2) Will not check a vancomycin trough at this point since saw discharge order to change to PO doxycycline.  Venancio Chenier, Tsz-Yin 04/04/2013,8:36 AM

## 2013-04-05 NOTE — Progress Notes (Signed)
AC:LLE DVT (7/20), s/p lysis on 7/20, s/p IVC filter. Requires life-long AC [place] most likely non-compliant w/Lovenox, has Medicaid now [name] should be able to take meds  CCL = > 120 >>Hgb 12 and Plt 458 K on 07/29 Discharge edu completed  PLAN: Xarelto 15 mg BID x 21 days, then change to 20 mg daily starting 08/14;

## 2013-04-05 NOTE — Progress Notes (Signed)
[facility] PROGRESS NOTE  [name] ZOX:[id number] DOB: [birth year] DOA: 03/26/2013  PCP: None  Brief HPI: 47 y.o. 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 He is supposed to be on chronic Lovenox therapy, 90 mg twice a day for his prior history of DVT. He had a 5 day interruption in his Lovenox secondary to being incarcerated (released on 03/22/13). The patient stated he developed the redness, swelling and pain 4 days prior to his admission. He was evaluated in the ER but they could not do a Doppler that night and he was released and instructed to continue his usual dose of Lovenox (which he resumed after being released from jail) and to return in one week if he continues to have problems with swelling. Patient had massive swelling of his left lower extremity and explained he had to have lysis of a thrombus in the past. His first blood clot was approximately 4 years ago. He does not have a PCP, but sees Dr. Gaylyn Rong. He was found to have phlegmasia cerulea dolens, started on IV heparin transitioned to xarelto. Meanwhile his left leg was tender and erythematous, with some cellulitis changes and so started him on IV vancomycin. He was then transitioned to po doxycycline.   Past medical history:  Past Medical History  Diagnosis Date  . Coronary artery disease   . Degenerative joint disease of spine   . Pulmonary embolism 06/2010  . Degenerative joint disease   . 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 ETOH abuse   . DVT (deep venous thrombosis)   . Hepatitis B   . Mental disorder   . Depression     Consultants: IR  Procedures:   7/20 - Venous doppler L LE - extensive, acute, occlusive DVT noted throughout the left lower extremity  7/20 - LLE venogram and  cavogram - reflux of TPA into proximal calf veins - initiation of venous lysis at 0.5 mg TNK/hr  7/21 - FOLLOW-UP VENOGRAPHY - MECHANICAL THROMBECTOMY OF THE FEMORAL AND ILIAC VEINS - BALLOON ANGIOPLASTY OF FEMORAL AND ILIAC VEINS  Antibiotics: IV Vanc till 7/29 Oral Doxycycline  Subjective: Patient feels well. Says the leg swelling is much better. Denies any other complaints.  Objective: Vital Signs  Filed Vitals:   04/04/13 1354 04/04/13 1949 04/05/13 0429 04/05/13 1301  BP: 92/50 106/69 102/61 99/59  Pulse: 72 71 52 67  Temp: 98.2 F (36.8 C) 97.7 F (36.5 C) 97.8 F (36.6 C) 98.8 F (37.1 C)  TempSrc: Oral Oral Oral Oral  Resp: 18 18 18 20   Height:      Weight:      SpO2: 99% 97% 99% 98%    Intake/Output Summary (Last 24 hours) at 04/05/13 1318 Last data filed at 04/05/13 0810  Gross per 24 hour  Intake    240 ml  Output    350 ml  Net   -110 ml   Filed Weights   03/27/13 0015 04/03/13 0617  Weight: 86.2 kg (190 lb 0.6 oz) 87.4 kg (192 lb 10.9 oz)   General appearance: alert, cooperative, appears stated age and no distress Resp: clear to auscultation bilaterally Cardio: regular rate and rhythm, S1, S2 normal, no murmur, click, rub or gallop GI: soft, non-tender; bowel sounds normal; no masses,  no organomegaly Extremities: Left  leg is more swollen than right with chronic skin changes. Neurologic: Alert and oriented X 3, normal strength and tone. Normal symmetric reflexes. Normal coordination and gait  Lab Results:  Basic Metabolic Panel:  Recent Labs Lab 03/30/13 0430  NA 137  K 3.9  CL 101  CO2 28  GLUCOSE 91  BUN 7  CREATININE 0.59  CALCIUM 8.8   CBC:  Recent Labs Lab 04/01/13 0435 04/04/13 0605  WBC 10.7* 7.4  HGB 11.5* 12.0*  HCT 35.4* 37.8*  MCV 98.1 99.0  PLT 345 458*    Recent Results (from the past 240 hour(s))  MRSA PCR SCREENING     Status: None   Collection Time    03/26/13  8:25 PM      Result Value Range Status   MRSA  by PCR NEGATIVE  NEGATIVE Final   Comment:            The GeneXpert MRSA Assay (FDA     approved for NASAL specimens     only), is one component of a     comprehensive MRSA colonization     surveillance program. It is not     intended to diagnose MRSA     infection nor to guide or     monitor treatment for     MRSA infections.      Studies/Results: No results found.  Medications:  Scheduled: . doxycycline  100 mg Oral Q12H  . folic acid  1 mg Oral Daily  . multivitamin with minerals  1 tablet Oral Daily  . nicotine  21 mg Transdermal Daily  . Rivaroxaban  15 mg Oral BID WC  . sodium chloride  3 mL Intravenous Q12H  . thiamine  100 mg Oral Daily   Continuous:  EXB:[id number], acetaminophen, alum & mag hydroxide-simeth, HYDROmorphone (DILAUDID) injection, ondansetron (ZOFRAN) IV, ondansetron, oxyCODONE  Assessment/Plan:  Principal Problem:   Phlegmasia cerulea dolens, left lower extremity Active Problems:   Alcohol abuse   Tobacco abuse   Lupus anticoagulant disorder    Phlegmasia cerulea dolens, left lower extremity  Now s/p IR intervention with TPA and TNK infusion - returned to IR for angioplasty and angiojet as TNK had to be stopped due to falling fibrinogen level. Review of office visits with Dr. Gaylyn Rong reveals that compliance was an issue, and that pt is not felt to have "failed" prior tx attempts with coumadin or Xarelto, but rather to have been noncompliant. Compliance discussed with patient by Dr. Blake Divine. He reported that he did not have medicaid previously, hence has been missing medications. Now he has medicaid and will be able to obtain meds and will be compliant. Agreed to be on xarelto rather than lovenox injections. Was started on Xarelto. Was also placed on antibiotics for presumed cellulitis in same leg. Now on oral Doxy.  Lupus anticoagulant disorder w/ prior hx of DVT/PE s/p IVC filter  On IV heparin to be transitioned to xarelto - requires lifelong  anticoag   Mild hyponatremia  Resolved. Likely related to EtOH abuse   Alcohol abuse  Detoxification with Ativan per CIWA protocol - supplement thiamine and folic acid - no florid withdrawal at this time   Tobacco abuse  Counseled on need to d/c tobacco   Hepatitis B  LFTs are normal at this time   Code Status:  Full Code DVT Prophylaxis:   xarelto Family Communication: Discussed with patient  Disposition Plan: Await placement to ALF per CSW. Patient is otherwise homeless. Medically he is  stable for discharge.    LOS: 10 days   [facility]  [facility] Pager [phone] 04/05/2013, 1:18 PM  If 8PM-8AM, please contact night-coverage at [email or web address], password [facility]

## 2013-04-05 NOTE — Progress Notes (Signed)
PIV removed per Pt request, and MD approval, awaiting DC plans per CSW.

## 2013-04-06 MED ORDER — TRAZODONE HCL 50 MG PO TABS: 50.0000 mg | ORAL_TABLET | Freq: Every evening | ORAL | Status: DC | PRN

## 2013-04-06 NOTE — Progress Notes (Signed)
Clinical Social Worker facilitated patient discharge by contacting the patient and facility [facility]. Patient agreeable to this plan and arranging transport via EMS . CSW will sign off, as social work intervention is no longer needed.  Maree Krabbe, MSW, [facility] [phone]

## 2013-04-06 NOTE — Progress Notes (Signed)
Physical Therapy Treatment Patient Details Name: [name] MRN: [id number] DOB: [birth year] Today's Date: 04/06/2013 Time: 4098-1191 PT Time Calculation (min): 18 min  PT Assessment / Plan / Recommendation  History of Present Illness 47 y.o. 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   PT Comments   Pt demonstrates steady progress towards PT goals at this time. Improvements in ambulation, will benefit from use of cane. Will continue to see as indicated.   Follow Up Recommendations  Outpatient PT     Does the patient have the potential to tolerate intense rehabilitation     Barriers to Discharge        Equipment Recommendations  Cane    Recommendations for Other Services    Frequency Min 3X/week   Progress towards PT Goals Progress towards PT goals: Progressing toward goals  Plan Current plan remains appropriate    Precautions / Restrictions Precautions Precautions: Fall Precaution Comments: pt reports h/o lots of falls Restrictions Weight Bearing Restrictions: No   Pertinent Vitals/Pain 2/10    Mobility  Bed Mobility Bed Mobility: Supine to Sit;Sit to Supine Supine to Sit: 6: Modified independent (Device/Increase time) Sit to Supine: 6: Modified independent (Device/Increase time) Transfers Transfers: Sit to Stand;Stand to Sit Sit to Stand: 5: Supervision Stand to Sit: 5: Supervision Details for Transfer Assistance: VCs for hand placement Ambulation/Gait Ambulation/Gait Assistance: 5: Supervision Ambulation Distance (Feet): 600 Feet Assistive device: Rolling walker;None Ambulation/Gait Assistance Details: some instability noted but patient able to self correct; pt may benefit from use of SPC Gait Pattern: Step-to pattern;Trunk  flexed;Antalgic;Decreased dorsiflexion - left;Decreased weight shift to left Gait velocity: decreased General Gait Details: steady but painful ambulation with heavy reliance on rw    Exercises General Exercises - Lower Extremity Ankle Circles/Pumps: AROM;Left;5 reps Other Exercises Other Exercises: reinforced edema control techniques     PT Goals (current goals can now be found in the care plan section) Acute Rehab PT Goals Patient Stated Goal: to get back to normal PT Goal Formulation: With patient Time For Goal Achievement: 04/13/13  Visit Information  Last PT Received On: 04/06/13 Assistance Needed: +1 History of Present Illness: 47 y.o. 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    Subjective Data  Subjective: I feel like I am healing a lot Patient Stated Goal: to get back to normal   Cognition  Cognition Arousal/Alertness: Awake/alert Behavior During Therapy: WFL for tasks assessed/performed Overall Cognitive Status: History of cognitive impairments - at baseline (most likely baseline functioning)    Balance  Static Sitting Balance Static Sitting - Balance Support: Bilateral upper extremity supported;Feet supported Static Sitting - Level of Assistance: 7: Independent  End of Session PT - End of Session Equipment Utilized During Treatment: Gait belt Activity Tolerance: Patient limited by pain Patient left: in bed;with call bell/phone within reach Nurse Communication: Mobility status   GP     Fabio Asa 04/06/2013, 2:11 PM Charlotte Crumb, PT DPT  [phone]

## 2013-04-06 NOTE — Progress Notes (Signed)
[facility] PROGRESS NOTE  [name] ZOX:[id number] DOB: 1965/12/29 DOA: 03/26/2013  PCP: None  Brief HPI: 47 y.o. 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 He is supposed to be on chronic Lovenox therapy, 90 mg twice a day for his prior history of DVT. He had a 5 day interruption in his Lovenox secondary to being incarcerated (released on 03/22/13). The patient stated he developed the redness, swelling and pain 4 days prior to his admission. He was evaluated in the ER but they could not do a Doppler that night and he was released and instructed to continue his usual dose of Lovenox (which he resumed after being released from jail) and to return in one week if he continues to have problems with swelling. Patient had massive swelling of his left lower extremity and explained he had to have lysis of a thrombus in the past. His first blood clot was approximately 4 years ago. He does not have a PCP, but sees Dr. Gaylyn Rong. He was found to have phlegmasia cerulea dolens, started on IV heparin transitioned to xarelto. Meanwhile his left leg was tender and erythematous, with some cellulitis changes and so started him on IV vancomycin. He was then transitioned to po doxycycline.   Past medical history:  Past Medical History  Diagnosis Date  . Coronary artery disease   . Degenerative joint disease of spine   . Pulmonary embolism 06/2010  . Degenerative joint disease   . 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 ETOH abuse   . DVT (deep venous thrombosis)   . Hepatitis B   . Mental disorder   . Depression     Consultants: IR  Procedures:   7/20 - Venous doppler L LE - extensive, acute, occlusive DVT noted throughout the left lower extremity  7/20 - LLE venogram and  cavogram - reflux of TPA into proximal calf veins - initiation of venous lysis at 0.5 mg TNK/hr  7/21 - FOLLOW-UP VENOGRAPHY - MECHANICAL THROMBECTOMY OF THE FEMORAL AND ILIAC VEINS - BALLOON ANGIOPLASTY OF FEMORAL AND ILIAC VEINS  Antibiotics: IV Vanc till 7/29 Oral Doxycycline  Subjective: Patient continues to feel well. Says the leg swelling is much better. Denies any other complaints.  Objective: Vital Signs  Filed Vitals:   04/05/13 0429 04/05/13 1301 04/05/13 2111 04/06/13 0435  BP: 102/61 99/59 99/55  90/51  Pulse: 52 67 50 59  Temp: 97.8 F (36.6 C) 98.8 F (37.1 C) 99.1 F (37.3 C) 98.2 F (36.8 C)  TempSrc: Oral Oral Oral Oral  Resp: 18 20 18 18   Height:      Weight:      SpO2: 99% 98% 99% 99%    Intake/Output Summary (Last 24 hours) at 04/06/13 1152 Last data filed at 04/05/13 1721  Gross per 24 hour  Intake    240 ml  Output      0 ml  Net    240 ml   Filed Weights   03/27/13 0015 04/03/13 0617  Weight: 86.2 kg (190 lb 0.6 oz) 87.4 kg (192 lb 10.9 oz)   General appearance: alert, cooperative, appears stated age and no distress Resp: clear to auscultation bilaterally Cardio: regular rate and rhythm, S1, S2 normal, no murmur, click, rub or gallop GI: soft, non-tender; bowel sounds normal; no masses,  no organomegaly Extremities: Left leg is more swollen than right with chronic skin changes. Neurologic: Alert and oriented X 3, normal strength and tone. Normal symmetric reflexes. Normal coordination and gait  Lab Results:  Basic Metabolic Panel: No results found for this basename: NA, K, CL, CO2, GLUCOSE, BUN, CREATININE, CALCIUM, MG, PHOS,  in the last 168 hours CBC:  Recent Labs Lab 04/01/13 0435 04/04/13 0605  WBC 10.7* 7.4  HGB 11.5* 12.0*  HCT 35.4* 37.8*  MCV 98.1 99.0  PLT 345 458*    No results found for this or any previous visit (from the past 240 hour(s)).    Studies/Results: No results found.  Medications:  Scheduled: .  doxycycline  100 mg Oral Q12H  . folic acid  1 mg Oral Daily  . multivitamin with minerals  1 tablet Oral Daily  . nicotine  21 mg Transdermal Daily  . Rivaroxaban  15 mg Oral BID WC  . sodium chloride  3 mL Intravenous Q12H  . thiamine  100 mg Oral Daily   Continuous:  NWG:[id number], acetaminophen, alum & mag hydroxide-simeth, HYDROmorphone (DILAUDID) injection, ondansetron (ZOFRAN) IV, ondansetron, oxyCODONE  Assessment/Plan:  Principal Problem:   Phlegmasia cerulea dolens, left lower extremity Active Problems:   Alcohol abuse   Tobacco abuse   Lupus anticoagulant disorder    Phlegmasia cerulea dolens, left lower extremity  Now s/p IR intervention with TPA and TNK infusion - returned to IR for angioplasty and angiojet as TNK had to be stopped due to falling fibrinogen level. Review of office visits with Dr. Gaylyn Rong reveals that compliance was an issue, and that pt is not felt to have "failed" prior tx attempts with coumadin or Xarelto, but rather to have been noncompliant. Compliance discussed with patient by Dr. Blake Divine. He reported that he did not have medicaid previously, hence has been missing medications. Now he has medicaid and will be able to obtain meds and will be compliant. Agreed to be on xarelto rather than lovenox injections. Was started on Xarelto. Was also placed on antibiotics for presumed cellulitis in same leg. Now on oral Doxy.  Lupus anticoagulant disorder w/ prior hx of DVT/PE s/p IVC filter  On IV heparin to be transitioned to xarelto - requires lifelong anticoag   Mild hyponatremia  Resolved. Likely related to EtOH abuse   Alcohol abuse  Detoxification with Ativan per CIWA protocol - supplement thiamine and folic acid - no florid withdrawal at this time   Tobacco abuse  Counseled on need to d/c tobacco   Hepatitis B  LFTs are normal at this time   Code Status:  Full Code DVT Prophylaxis:   xarelto Family Communication: Discussed with patient   Disposition Plan: Medically he is stable for discharge. For ALF today. See discharge summary by Dr. Blake Divine.    LOS: 11 days   [facility]  [facility] Pager [phone] 04/06/2013, 11:52 AM  If 8PM-8AM, please contact night-coverage at [email or web address], password [facility]

## 2013-04-06 NOTE — Progress Notes (Signed)
Clinical Social Worker provided patient with bed offer at [facility] in [place]. Patient states he is okay with going there. CSW confirmed bed with [facility]. Plan is for d/c later today. CSW working on d/c. Will update when new information arises.  Maree Krabbe, MSW, [facility] [phone]

## 2013-04-07 ENCOUNTER — Telehealth: Payer: Self-pay | Admitting: Hematology and Oncology

## 2013-04-07 NOTE — ED Provider Notes (Signed)
Medical screening examination/treatment/procedure(s) were performed by non-physician practitioner and as supervising physician I was immediately available for consultation/collaboration.  Mallory Enriques M Yanuel Tagg, MD 04/07/13 0209 

## 2013-04-07 NOTE — Telephone Encounter (Signed)
S/w pt re appt for 8/15 @ 8:30am lb/CP2. Per pt he is currently in Rehab in [place] but they said they would get him to appt.

## 2013-04-21 ENCOUNTER — Ambulatory Visit (HOSPITAL_BASED_OUTPATIENT_CLINIC_OR_DEPARTMENT_OTHER): Payer: Medicaid Other | Admitting: Hematology and Oncology

## 2013-04-21 ENCOUNTER — Encounter (HOSPITAL_COMMUNITY): Payer: Self-pay

## 2013-04-21 ENCOUNTER — Other Ambulatory Visit (HOSPITAL_BASED_OUTPATIENT_CLINIC_OR_DEPARTMENT_OTHER): Payer: Medicaid Other | Admitting: Lab

## 2013-04-21 ENCOUNTER — Inpatient Hospital Stay (HOSPITAL_COMMUNITY)
Admission: AD | Admit: 2013-04-21 | Discharge: 2013-04-25 | DRG: 300 | Disposition: A | Discharge status: Home / Self Care | Payer: Medicaid Other | Source: Ambulatory Visit | Attending: Internal Medicine | Admitting: Internal Medicine

## 2013-04-21 VITALS — BP 114/67 | HR 78 | Temp 98.8°F | Resp 19 | Ht 74.0 in | Wt 194.1 lb

## 2013-04-21 DIAGNOSIS — Z86711 Personal history of pulmonary embolism: Secondary | ICD-10-CM

## 2013-04-21 DIAGNOSIS — I82409 Acute embolism and thrombosis of unspecified deep veins of unspecified lower extremity: Secondary | ICD-10-CM

## 2013-04-21 DIAGNOSIS — F17200 Nicotine dependence, unspecified, uncomplicated: Secondary | ICD-10-CM

## 2013-04-21 DIAGNOSIS — I80202 Phlebitis and thrombophlebitis of unspecified deep vessels of left lower extremity: Secondary | ICD-10-CM

## 2013-04-21 DIAGNOSIS — Z720 Tobacco use: Secondary | ICD-10-CM

## 2013-04-21 DIAGNOSIS — Z7901 Long term (current) use of anticoagulants: Secondary | ICD-10-CM

## 2013-04-21 DIAGNOSIS — L0390 Cellulitis, unspecified: Secondary | ICD-10-CM

## 2013-04-21 DIAGNOSIS — D6862 Lupus anticoagulant syndrome: Secondary | ICD-10-CM

## 2013-04-21 DIAGNOSIS — I80299 Phlebitis and thrombophlebitis of other deep vessels of unspecified lower extremity: Secondary | ICD-10-CM

## 2013-04-21 DIAGNOSIS — D6859 Other primary thrombophilia: Secondary | ICD-10-CM

## 2013-04-21 DIAGNOSIS — R109 Unspecified abdominal pain: Secondary | ICD-10-CM

## 2013-04-21 DIAGNOSIS — F1020 Alcohol dependence, uncomplicated: Secondary | ICD-10-CM

## 2013-04-21 DIAGNOSIS — L02419 Cutaneous abscess of limb, unspecified: Secondary | ICD-10-CM | POA: Diagnosis present

## 2013-04-21 DIAGNOSIS — I82403 Acute embolism and thrombosis of unspecified deep veins of lower extremity, bilateral: Secondary | ICD-10-CM

## 2013-04-21 DIAGNOSIS — L03116 Cellulitis of left lower limb: Secondary | ICD-10-CM

## 2013-04-21 DIAGNOSIS — I87009 Postthrombotic syndrome without complications of unspecified extremity: Principal | ICD-10-CM | POA: Diagnosis present

## 2013-04-21 DIAGNOSIS — I2510 Atherosclerotic heart disease of native coronary artery without angina pectoris: Secondary | ICD-10-CM | POA: Diagnosis present

## 2013-04-21 DIAGNOSIS — R609 Edema, unspecified: Secondary | ICD-10-CM

## 2013-04-21 DIAGNOSIS — F1010 Alcohol abuse, uncomplicated: Secondary | ICD-10-CM

## 2013-04-21 DIAGNOSIS — Z79899 Other long term (current) drug therapy: Secondary | ICD-10-CM

## 2013-04-21 DIAGNOSIS — I82402 Acute embolism and thrombosis of unspecified deep veins of left lower extremity: Secondary | ICD-10-CM

## 2013-04-21 DIAGNOSIS — M7989 Other specified soft tissue disorders: Secondary | ICD-10-CM

## 2013-04-21 LAB — COMPREHENSIVE METABOLIC PANEL (CC13)
ALT: 24 U/L (ref 0–55)
AST: 25 U/L (ref 5–34)
Albumin: 4.1 g/dL (ref 3.5–5.0)
Alkaline Phosphatase: 79 U/L (ref 40–150)
BUN: 14.7 mg/dL (ref 7.0–26.0)
Calcium: 10 mg/dL (ref 8.4–10.4)
Chloride: 104 mEq/L (ref 98–109)
Potassium: 4.5 mEq/L (ref 3.5–5.1)
Sodium: 137 mEq/L (ref 136–145)

## 2013-04-21 LAB — CBC WITH DIFFERENTIAL/PLATELET
BASO%: 0.4 % (ref 0.0–2.0)
Basophils Absolute: 0 10*3/uL (ref 0.0–0.1)
EOS%: 1.1 % (ref 0.0–7.0)
HGB: 12.2 g/dL — ABNORMAL LOW (ref 13.0–17.1)
MCH: 31.6 pg (ref 27.2–33.4)
MCHC: 32.9 g/dL (ref 32.0–36.0)
MCV: 96.1 fL (ref 79.3–98.0)
MONO%: 5 % (ref 0.0–14.0)
RBC: 3.86 10*6/uL — ABNORMAL LOW (ref 4.20–5.82)
RDW: 13.8 % (ref 11.0–14.6)
lymph#: 2 10*3/uL (ref 0.9–3.3)

## 2013-04-21 MED ORDER — RIVAROXABAN 20 MG PO TABS
20.0000 mg | ORAL_TABLET | Freq: Every day | ORAL | Status: DC
  Administered 2013-04-22 – 2013-04-23 (×2): 20 mg via ORAL
  Filled 2013-04-21 (×2): qty 1

## 2013-04-21 MED ORDER — VANCOMYCIN HCL 10 G IV SOLR
1500.0000 mg | Freq: Two times a day (BID) | INTRAVENOUS | Status: DC
  Administered 2013-04-22 – 2013-04-23 (×3): 1500 mg via INTRAVENOUS
  Filled 2013-04-21 (×4): qty 1500

## 2013-04-21 MED ORDER — NICOTINE 21 MG/24HR TD PT24
21.0000 mg | MEDICATED_PATCH | Freq: Every day | TRANSDERMAL | Status: DC
  Administered 2013-04-21 – 2013-04-25 (×5): 21 mg via TRANSDERMAL
  Filled 2013-04-21 (×5): qty 1

## 2013-04-21 MED ORDER — FOLIC ACID 1 MG PO TABS
1.0000 mg | ORAL_TABLET | Freq: Every day | ORAL | Status: DC
  Administered 2013-04-21 – 2013-04-25 (×5): 1 mg via ORAL
  Filled 2013-04-21 (×5): qty 1

## 2013-04-21 MED ORDER — ACETAMINOPHEN 325 MG PO TABS: 650.0000 mg | ORAL_TABLET | Freq: Four times a day (QID) | ORAL | Status: DC | PRN

## 2013-04-21 MED ORDER — OXYCODONE-ACETAMINOPHEN 5-325 MG PO TABS
1.0000 | ORAL_TABLET | Freq: Four times a day (QID) | ORAL | Status: DC | PRN
  Administered 2013-04-21 (×3): 1 via ORAL
  Filled 2013-04-21 (×3): qty 1

## 2013-04-21 MED ORDER — VANCOMYCIN HCL 10 G IV SOLR
1500.0000 mg | INTRAVENOUS | Status: AC
  Administered 2013-04-21: 1500 mg via INTRAVENOUS
  Filled 2013-04-21: qty 1500

## 2013-04-21 MED ORDER — TRAZODONE HCL 50 MG PO TABS
50.0000 mg | ORAL_TABLET | Freq: Every evening | ORAL | Status: DC | PRN
  Administered 2013-04-22 – 2013-04-24 (×3): 50 mg via ORAL
  Filled 2013-04-21 (×3): qty 1

## 2013-04-21 MED ORDER — OXYCODONE-ACETAMINOPHEN 5-325 MG PO TABS
1.0000 | ORAL_TABLET | ORAL | Status: DC | PRN
  Administered 2013-04-22 – 2013-04-25 (×15): 1 via ORAL
  Filled 2013-04-21 (×15): qty 1

## 2013-04-21 NOTE — H&P (Signed)
[facility] History and Physical  [name] ZOX:[id number] DOB: 18-Jul-1966 DOA: 04/21/2013  Referring physician: Oncology PCP: Default, Provider, MD  Specialists: Dr.Ha, Onc  Chief Complaint: Sent from Cancer center due to concern for cellulitis  HPI: [name] is a 47 y.o. male with PMH of alcoholism, tobacco abuse, L leg DVT,  lupus anticoagulant, h/o TPA/ Mechanical Thrombectomy of the femoral and iliac veins - Balloon Angioplasty of Femoral and Iliac veins, this was done at  [facility] 3 weeks ago, s/p IVC filter and discharged to SNF on 7/31, was just discharged from SNF 2 days ago and is currently homeless, living in a tent. He was seen at the [facility] today by [facility] and referred to the floor as a direct admit for management for cellulitis. Pt reports chronic L leg swelling with redness and intermittent pain for 2-21months, he tells me that his leg is not too different from how it appeared at discharge on 7/31, but does reports occasional increased swelling esp when he's up on his feet most of the day. No fevers or chills. Pt reports compliance with Xarelto.   Review of Systems: The patient denies anorexia, fever, weight loss,, vision loss, decreased hearing, hoarseness, chest pain, syncope, dyspnea on exertion, peripheral edema, balance deficits, hemoptysis, abdominal pain, melena, hematochezia, severe indigestion/heartburn, hematuria, incontinence, genital sores, muscle weakness, suspicious skin lesions, transient blindness, difficulty walking, depression, unusual weight change, abnormal bleeding, enlarged lymph nodes, angioedema, and breast masses.    Past Medical History  Diagnosis Date  . Coronary artery disease   . Degenerative joint disease of spine   . Pulmonary embolism 06/2010  . Degenerative joint disease   . 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 ETOH abuse   . DVT (deep venous thrombosis)   . Hepatitis B   . Mental disorder   . Depression    Past  Surgical History  Procedure Laterality Date  . Left elbow surgery    . Tonsillectomy     Social History:  reports that he has been smoking Cigarettes.  He has a 60 pack-year smoking history. He has never used smokeless tobacco. He reports that  drinks alcohol. He reports that he does not use illicit drugs. Currently homeless  No Known Allergies  Family History  Problem Relation Age of Onset  . Cancer Mother     Ovarian    Prior to Admission medications   Medication Sig Start Date End Date Taking? Authorizing Provider  folic acid (FOLVITE) 1 MG tablet Take 1 tablet (1 mg total) by mouth daily. 04/03/13  Yes Kathlen Mody, MD  nicotine (NICODERM CQ - DOSED IN MG/24 HOURS) 21 mg/24hr patch Place 1 patch onto the skin daily. 04/03/13  Yes Kathlen Mody, MD  oxyCODONE-acetaminophen (PERCOCET/ROXICET) 5-325 MG per tablet Take 2 tablets by mouth every 6 (six) hours as needed for pain. 04/03/13  Yes Kathlen Mody, MD  Rivaroxaban (XARELTO) 20 MG TABS Take 1 tablet (20 mg total) by mouth daily. 04/20/13  Yes Kathlen Mody, MD  traZODone (DESYREL) 50 MG tablet Take 1 tablet (50 mg total) by mouth at bedtime as needed for sleep. 04/06/13  Yes Osvaldo Shipper, MD   Physical Exam: Filed Vitals:   04/21/13 1317  BP: 111/68  Pulse: 52  Temp: 98 F (36.7 C)  Resp: 18     General:  AAOx3  HEENT: PERRLA, EOMI  CVS: S1S2/RRR  Lungs: CTAB  Abd: soft, NT, BS present  Ext: LLE with 2-3plus edema, area of blotchy erythema in  middle of lower leg, warmth over the area of erythema, diminished pulses  Psychiatric: appropriate mood and affect  Neurologic: non focal  Labs on Admission:  Basic Metabolic Panel:  Recent Labs Lab 04/21/13 0809  NA 137  K 4.5  CO2 20*  GLUCOSE 98  BUN 14.7  CREATININE 0.9  CALCIUM 10.0   Liver Function Tests:  Recent Labs Lab 04/21/13 0809  AST 25  ALT 24  ALKPHOS 79  BILITOT 0.52  PROT 8.4*  ALBUMIN 4.1   No results found for this basename: LIPASE,  AMYLASE,  in the last 168 hours No results found for this basename: AMMONIA,  in the last 168 hours CBC:  Recent Labs Lab 04/21/13 0809  WBC 9.5  NEUTROABS 6.9*  HGB 12.2*  HCT 37.1*  MCV 96.1  PLT 292   Cardiac Enzymes: No results found for this basename: CKTOTAL, CKMB, CKMBINDEX, TROPONINI,  in the last 168 hours  BNP (last 3 results) No results found for this basename: PROBNP,  in the last 8760 hours CBG: No results found for this basename: GLUCAP,  in the last 168 hours  Radiological Exams on Admission: No results found.   Assessment/Plan Active Problems:   Alcohol abuse   Tobacco abuse   Lupus anticoagulant disorder   Deep vein thrombosis   Post-phlebitic syndrome   1. L leg post phlebitic syndrome/? Possible cellulitis - clinically unimpressed by cellulitis - but will treat with IV Vanc and could transition to a short defined course of PO abx soon -check Dopplers LLE -continue xraelto -elevate LLE  Addendum: Dopplers this afternoon shows DVT -? Recurrent vs Old  I called and D/w IR, dr.Hoss, he did not recommended any intervention since extensive procedures on L leg done recently and I also d/w dr.Victor Roshtashov, Oncology, he recommended continuing Xarelto only and felt that DVTs seen were likely the prior clots and IV Abx  2. Tobacco use -continue nicotine patch  3. H/o Recent extensive LLE DVT -s/p TPA/Thrombectomy -continue xarelto  4. H/o ETOH abuse -sober for 1-2 months -continue thiamine  DVT proph: on therapeutic anticoagulation  Code Status: Full Code Family Communication: none at bedside Disposition Plan: inpatient  Time spent:  [facility] Pager [phone]  If 7PM-7AM, please contact night-coverage [email or web address] Password [facility] 04/21/2013, 3:51 PM

## 2013-04-21 NOTE — Progress Notes (Signed)
Patient declined activation of MyChart-homeless and no computer

## 2013-04-21 NOTE — Progress Notes (Addendum)
*  PRELIMINARY RESULTS* Vascular Ultrasound Left lower extremity venous duplex has been completed.  Preliminary findings: Left = Recurrent occlusive DVT involving the common femoral, femoral, and popliteal veins. Calf veins could not be clearly evaluated due to edema. Right common femoral vein is patent.   Farrel Demark, RDMS, RVT  04/21/2013, 2:06 PM

## 2013-04-21 NOTE — Progress Notes (Signed)
ANTIBIOTIC CONSULT NOTE - INITIAL  Pharmacy Consult for Vancomycin  Indication: L leg cellulitis    No Known Allergies  Patient Measurements:    Vital Signs: Temp: 98 F (36.7 C) (08/15 1100) Temp src: Oral (08/15 1100) BP: 132/72 mmHg (08/15 1100) Pulse Rate: 72 (08/15 1100) Intake/Output from previous day:   Intake/Output from this shift:    Labs:  Recent Labs  04/21/13 0809 04/21/13 0809  WBC 9.5  --   HGB 12.2*  --   PLT 292  --   CREATININE  --  0.9   The CrCl is unknown because both a height and weight (above a minimum accepted value) are required for this calculation. No results found for this basename: VANCOTROUGH, Leodis Binet, VANCORANDOM, GENTTROUGH, GENTPEAK, GENTRANDOM, TOBRATROUGH, TOBRAPEAK, TOBRARND, AMIKACINPEAK, AMIKACINTROU, AMIKACIN,  in the last 72 hours   Microbiology: Recent Results (from the past 720 hour(s))  MRSA PCR SCREENING     Status: None   Collection Time    03/26/13  8:25 PM      Result Value Range Status   MRSA by PCR NEGATIVE  NEGATIVE Final   Comment:            The GeneXpert MRSA Assay (FDA     approved for NASAL specimens     only), is one component of a     comprehensive MRSA colonization     surveillance program. It is not     intended to diagnose MRSA     infection nor to guide or     monitor treatment for     MRSA infections.    Medical History: Past Medical History  Diagnosis Date  . Coronary artery disease   . Degenerative joint disease of spine   . Pulmonary embolism 06/2010  . Degenerative joint disease   . 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 ETOH abuse   . DVT (deep venous thrombosis)   . Hepatitis B   . Mental disorder   . Depression     Medications:  Scheduled:  . folic acid  1 mg Oral Daily  . nicotine  21 mg Transdermal Daily  . [START ON 04/22/2013] Rivaroxaban  20 mg Oral Daily   Infusions:   PRN: acetaminophen, oxyCODONE-acetaminophen, traZODone Assessment: 47 yo M presents to the ER  with LLE cellulitis.  Pharmacy asked to dose vancomycin.   Scr is WNL with estimated CrCl > 100 ml/min   WBC WNL, af  Of note, Patient has a history of + lupus anticoagulant with Hx of DVT/PE who is s/p IVC filter.  Patient is on Xarelto 20 mg daily prior to admission.  His last dose was 8/15.  Will f/u r/o DVT   Goal of Therapy:  Vancomycin trough level 10-15 mcg/ml  Plan:  1.) Vancomycin 1500 mg IV q12h 2.) Monitor renal function, vancomycin troughs as needed 3.) f/u r/o DVT  Shenice Dolder, Loma Messing PharmD Pager #: [phone] 12:26 PM 04/21/2013

## 2013-04-21 NOTE — Progress Notes (Signed)
Patient walked from admitting to his room 1504, alert and oriented.  States he was discharged from [facility] 2 days ago , patient homeless, living in a tent.  Left leg is extremely edematous, red and painful.  Flow manager called for physcian assignment, awaiting orders, oriented patient to room, facilities, phone, ordering of meals.

## 2013-04-21 NOTE — Progress Notes (Signed)
Spoke with Dr. Jomarie Longs to assure she was aware of doppler findings.

## 2013-04-22 DIAGNOSIS — R109 Unspecified abdominal pain: Secondary | ICD-10-CM

## 2013-04-22 DIAGNOSIS — F1010 Alcohol abuse, uncomplicated: Secondary | ICD-10-CM

## 2013-04-22 DIAGNOSIS — L02419 Cutaneous abscess of limb, unspecified: Secondary | ICD-10-CM

## 2013-04-22 LAB — BASIC METABOLIC PANEL
CO2: 24 mEq/L (ref 19–32)
Calcium: 8.7 mg/dL (ref 8.4–10.5)
Creatinine, Ser: 0.64 mg/dL (ref 0.50–1.35)
GFR calc non Af Amer: >90 mL/min (ref >90)
Glucose, Bld: 89 mg/dL (ref 70–99)
Sodium: 139 mEq/L (ref 135–145)

## 2013-04-22 LAB — CBC
Hemoglobin: 11.2 g/dL — ABNORMAL LOW (ref 13.0–17.0)
MCH: 31.4 pg (ref 26.0–34.0)
MCHC: 32.7 g/dL (ref 30.0–36.0)
MCV: 95.8 fL (ref 78.0–100.0)

## 2013-04-22 MED ORDER — DIPHENHYDRAMINE HCL 25 MG PO CAPS: 25.0000 mg | ORAL_CAPSULE | Freq: Four times a day (QID) | ORAL | Status: DC | PRN

## 2013-04-22 NOTE — Progress Notes (Signed)
[facility] PROGRESS NOTE  [name] AVW:[id number] DOB: 1965/12/12 DOA: 04/21/2013 PCP: Default, Provider, MD  Assessment/Plan: #1 left leg postphlebitic syndrome/questionable probable cellulitis Clinical improvement. Decreased erythema decreased swelling. Deep lower extremity elevated. Continue empiric IV vancomycin. Follow.  #2 history of recent extensive left lower extremity DVT Status post TPA/thrombectomy. Patient's case has been discussed with Dr. Bonnielee [name] (the radiology. Continues her outcome.  #3 history of alcohol abuse Has been sober for the past one to 2 months. Continue thiamine.  #4 tobacco abuse Continue nicotine patch.  Code Status: Full Family Communication: Updated patient no family at bedside. Disposition Plan: Home when medically stable.   Consultants:  None  Procedures:  Lower extremity Dopplers 04/21/2013  Antibiotics:  IV vancomycin 04/22/2013  HPI/Subjective: Patient states she's feeling better. No complaints.  Objective: Filed Vitals:   04/22/13 1417  BP: 109/69  Pulse: 71  Temp: 98.1 F (36.7 C)  Resp: 16    Intake/Output Summary (Last 24 hours) at 04/22/13 1450 Last data filed at 04/22/13 1300  Gross per 24 hour  Intake   2458 ml  Output      0 ml  Net   2458 ml   Filed Weights   04/21/13 1100  Weight: 88.043 kg (194 lb 1.6 oz)    Exam:   General:  NAD  Cardiovascular:RRR  Respiratory: CTAB  Abdomen: Soft, nontender, nondistended, positive bowel sounds.  Musculoskeletal: Left lower extremity with decreased erythema, decreased warmth, decreased tenderness to palpation, decreased swelling.  Data Reviewed: Basic Metabolic Panel:  Recent Labs Lab 04/21/13 0809 04/22/13 0540  NA 137 139  K 4.5 3.7  CL  --  107  CO2 20* 24  GLUCOSE 98 89  BUN 14.7 13  CREATININE 0.9 0.64  CALCIUM 10.0 8.7   Liver Function Tests:  Recent Labs Lab 04/21/13 0809  AST 25  ALT 24  ALKPHOS 79  BILITOT 0.52  PROT 8.4*   ALBUMIN 4.1   No results found for this basename: LIPASE, AMYLASE,  in the last 168 hours No results found for this basename: AMMONIA,  in the last 168 hours CBC:  Recent Labs Lab 04/21/13 0809 04/22/13 0540  WBC 9.5 6.5  NEUTROABS 6.9*  --   HGB 12.2* 11.2*  HCT 37.1* 34.2*  MCV 96.1 95.8  PLT 292 236   Cardiac Enzymes: No results found for this basename: CKTOTAL, CKMB, CKMBINDEX, TROPONINI,  in the last 168 hours BNP (last 3 results) No results found for this basename: PROBNP,  in the last 8760 hours CBG: No results found for this basename: GLUCAP,  in the last 168 hours  No results found for this or any previous visit (from the past 240 hour(s)).   Studies: No results found.  Scheduled Meds: . folic acid  1 mg Oral Daily  . nicotine  21 mg Transdermal Daily  . Rivaroxaban  20 mg Oral Daily  . vancomycin  1,500 mg Intravenous Q12H   Continuous Infusions:   Principal Problem:   Post-phlebitic syndrome Active Problems:   Alcohol abuse   Tobacco abuse   Lupus anticoagulant disorder   Deep vein thrombosis   Cellulitis of leg, left    Time spent: . 35 mins    [facility]  [facility] Pager [phone]. If 7PM-7AM, please contact night-coverage at [email or web address], password [facility] 04/22/2013, 2:50 PM  LOS: 1 day

## 2013-04-22 NOTE — Plan of Care (Signed)
Problem: Phase I Progression Outcomes Goal: Pain controlled with appropriate interventions Outcome: Progressing Patient reports takes 2 percocets at home. Only ordered 1 tab here. Did get frequency changed to q4h from q6h.

## 2013-04-22 NOTE — Progress Notes (Signed)
Clinical Social Work Department BRIEF PSYCHOSOCIAL ASSESSMENT 04/22/2013  Patient:  [name],[name]     Account Number:  [id number]     Admit date:  04/21/2013  Clinical Social Worker:  Doroteo Glassman  Date/Time:  04/22/2013 04:28 PM  Referred by:  Physician  Date Referred:  04/22/2013 Referred for  Homelessness   Other Referral:   Interview type:  Patient Other interview type:    PSYCHOSOCIAL DATA Living Status:   Admitted from facility:   Level of care:   Primary support name:  Candee Furbish Primary support relationship to patient:  FRIEND Degree of support available:   unknown    CURRENT CONCERNS Current Concerns  Post-Acute Placement   Other Concerns:    SOCIAL WORK ASSESSMENT / PLAN Met with Pt to discuss d/c plans.  Pt left [facility] after 15 days of being there through a Letter of Guarantee. Per Supervisor, [facility], if facility accepts Pt back, Pt can return for the last 15 days.    Pt reported that he was told when he was admitted at [facility] on July 30th that he was going to be there for 15 days and that his d/c date would be August 13th.  He stated that he did not leave there AMA, rather he was d/c'd by the facility.    Pt reported that he has to contend with DSS and Social Security and that these things need to be handled quickly. He stated that he's concerned that if he return to [facility] that these 2 things will fall by the wayside. CSW attempted to redirect Pt's focus and brought attention back to Pt's health.  Pt agreed that he should be focusing on his health and decided that he'd like the evening to think about his d/c plans.    CSW to meet with Pt tomorrow to discuss d/c plans futher.    CSW thanked Pt for his time.   Assessment/plan status:   Other assessment/ plan:   Information/referral to community resources:   n/a    PATIENT'S/FAMILY'S RESPONSE TO PLAN OF CARE: Pt unsure what he wants to do upon d/c.  Pt concerned  about his [place] and disability claim falling by the wayside, should he return to [facility].    Pt thanked CSW for time and assistance.   Providence Crosby, LCSWA Clinical Social Work [phone]

## 2013-04-23 LAB — BASIC METABOLIC PANEL
BUN: 8 mg/dL (ref 6–23)
Calcium: 9 mg/dL (ref 8.4–10.5)
Creatinine, Ser: 0.59 mg/dL (ref 0.50–1.35)
GFR calc Af Amer: >90 mL/min (ref >90)
GFR calc non Af Amer: >90 mL/min (ref >90)
Glucose, Bld: 89 mg/dL (ref 70–99)
Potassium: 3.9 mEq/L (ref 3.5–5.1)

## 2013-04-23 LAB — CBC
Hemoglobin: 11.7 g/dL — ABNORMAL LOW (ref 13.0–17.0)
MCH: 31.6 pg (ref 26.0–34.0)
MCHC: 32.7 g/dL (ref 30.0–36.0)
Platelets: 234 10*3/uL (ref 150–400)
RDW: 13.5 % (ref 11.5–15.5)

## 2013-04-23 MED ORDER — DOXYCYCLINE HYCLATE 100 MG PO TABS
100.0000 mg | ORAL_TABLET | Freq: Two times a day (BID) | ORAL | Status: DC
  Administered 2013-04-23 – 2013-04-25 (×5): 100 mg via ORAL
  Filled 2013-04-23 (×6): qty 1

## 2013-04-23 MED ORDER — RIVAROXABAN 20 MG PO TABS
20.0000 mg | ORAL_TABLET | Freq: Every day | ORAL | Status: DC
  Administered 2013-04-24 – 2013-04-25 (×2): 20 mg via ORAL
  Filled 2013-04-23 (×3): qty 1

## 2013-04-23 NOTE — Progress Notes (Signed)
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 Patient is lupus anticoagulant positive. Patient has a history of pulmonary embolism in October 2011.   PAST THERAPY: Patient was very noncompliant with Coumadin monitoring due to lack of transportation and being homeless. S/P IVC filter placement in October 2011. He was started on Xarelto in April 2014 but this was stopped due to propagation of his left lower extremity DVT.   CURRENT THERAPY: Lovenox 90 mg twice a day started in April 2014   INTERVAL HISTORY:  [name] 47 y.o. male returns for routine follow-up visit.  He complaints on fatigue, dyspnea on exertion, pain all over especially in lower back and legs, migraine (not daily) double and blurry vision. His appetite is good and weight is stable.The patient denied fever, chills, night sweats. He denied  nasal congestion, nasal discharge, hearing problems, odynophagia or dysphagia. No chest pain, palpitations,  cough, abdominal pain, nausea, vomiting, diarrhea, constipation, hematochezia. The patient denied dysuria, nocturia, polyuria, hematuria, myalgia, numbness, tingling, psychiatric problems.   Review of Systems  Constitutional: Positive for malaise/fatigue. Negative for fever, chills, weight loss and diaphoresis.  HENT: Negative for hearing loss, nosebleeds, congestion, sore throat, neck pain, tinnitus and ear discharge.   Eyes: Positive for blurred vision and double vision. Negative for photophobia and pain.  Respiratory: Positive for shortness of breath. Negative for cough, hemoptysis, sputum production and wheezing.   Cardiovascular: Negative for chest pain, palpitations, orthopnea, claudication, leg swelling and PND.  Gastrointestinal: Negative for heartburn, nausea, vomiting, abdominal pain, diarrhea, constipation, blood in stool and melena.  Genitourinary: Negative for dysuria,  urgency, frequency and hematuria.  Musculoskeletal: Positive for back pain and joint pain. Negative for myalgias and falls.  Skin: Positive for itching. Negative for rash.  Neurological: Positive for headaches. Negative for dizziness, tingling, tremors, sensory change, speech change, focal weakness, loss of consciousness and weakness.  Psychiatric/Behavioral: Negative.    PAST MEDICAL HISTORY: Past Medical History  Diagnosis Date  . Coronary artery disease   . Degenerative joint disease of spine   . Pulmonary embolism 06/2010  . Degenerative joint disease   . 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 ETOH abuse   . DVT (deep venous thrombosis)   . Hepatitis B   . Mental disorder   . Depression     PAST SURGICAL HISTORY: Past Surgical History  Procedure Laterality Date  . Left elbow surgery    . Tonsillectomy      FAMILY HISTORY Family History  Problem Relation Age of Onset  . Cancer Mother     Ovarian   HEALTH MAINTENANCE: History  Substance Use Topics  . Smoking status: Current Every Day Smoker -- 2.00 packs/day for 30 years    Types: Cigarettes  . Smokeless tobacco: Never Used  . Alcohol Use: Yes     Comment: Drinks a 6-pack of beer daily.   No Known Allergies  No current facility-administered medications for this visit.   No current outpatient prescriptions on file.   Facility-Administered Medications Ordered in Other Visits  Medication Dose Route Frequency Provider Last Rate Last Dose  . acetaminophen (TYLENOL) tablet 650 mg  650 mg Oral Q6H PRN Zannie Cove, MD      . diphenhydrAMINE (BENADRYL) capsule 25 mg  25 mg Oral Q6H PRN Jinger Neighbors, NP      . doxycycline (VIBRA-TABS) tablet 100 mg  100 mg Oral Q12H Reuel Boom  Ninfa Linden, MD   100 mg at 04/23/13 2100  . folic acid (FOLVITE) tablet 1 mg  1 mg Oral Daily Zannie Cove, MD   1 mg at 04/23/13 0944  . nicotine (NICODERM CQ - dosed in mg/24 hours) patch 21 mg  21 mg Transdermal Daily Zannie Cove, MD    21 mg at 04/23/13 0943  . oxyCODONE-acetaminophen (PERCOCET/ROXICET) 5-325 MG per tablet 1 tablet  1 tablet Oral Q4H PRN Jinger Neighbors, NP   1 tablet at 04/23/13 1857  . [START ON 04/24/2013] Rivaroxaban (XARELTO) tablet 20 mg  20 mg Oral Q breakfast Rodolph Bong, MD      . traZODone (DESYREL) tablet 50 mg  50 mg Oral QHS PRN Zannie Cove, MD   50 mg at 04/22/13 2236    OBJECTIVE: Filed Vitals:   04/21/13 0847  BP: 114/67  Pulse: 78  Temp: 98.8 F (37.1 C)  Resp: 19     Body mass index is 24.91 kg/(m^2).    ECOG FS:1  HEENT: Sclerae anicteric.  Conjunctivae were pink. Pupils round and reactive bilaterally. Oral mucosa is moist without ulceration or thrush. No occipital, submandibular, cervical, supraclavicular or axillar adenopathy. Lungs: clear to auscultation without wheezes. No rales or rhonchi. Heart: regular rate and rhythm. No murmur, gallop or rubs. Abdomen: soft, non tender. No guarding or rebound tenderness. Bowel sounds are present. No palpable hepatosplenomegaly. MSK: no focal spinal tenderness. Extremities: LLE with +3  Peripheral edema,  erythema in middle of lower leg, warmth over the area of erythema, diminished pulses. Neuro: non-focal, alert and oriented to time, person and place, appropriate affect  LAB RESULTS:  CMP     Component Value Date/Time   NA 138 04/23/2013 0536   NA 137 04/21/2013 0809   K 3.9 04/23/2013 0536   K 4.5 04/21/2013 0809   CL 105 04/23/2013 0536   CL 109* 01/19/2013 1247   CO2 24 04/23/2013 0536   CO2 20* 04/21/2013 0809   GLUCOSE 89 04/23/2013 0536   GLUCOSE 98 04/21/2013 0809   GLUCOSE 82 01/19/2013 1247   BUN 8 04/23/2013 0536   BUN 14.7 04/21/2013 0809   CREATININE 0.59 04/23/2013 0536   CREATININE 0.9 04/21/2013 0809   CALCIUM 9.0 04/23/2013 0536   CALCIUM 10.0 04/21/2013 0809   PROT 8.4* 04/21/2013 0809   PROT 5.6* 03/28/2013 0345   ALBUMIN 4.1 04/21/2013 0809   ALBUMIN 2.4* 03/28/2013 0345   AST 25 04/21/2013 0809   AST 19 03/28/2013  0345   ALT 24 04/21/2013 0809   ALT 11 03/28/2013 0345   ALKPHOS 79 04/21/2013 0809   ALKPHOS 60 03/28/2013 0345   BILITOT 0.52 04/21/2013 0809   BILITOT 0.2* 03/28/2013 0345   GFRNONAA >90 04/23/2013 0536   GFRAA >90 04/23/2013 0536    Lab Results  Component Value Date   WBC 5.3 04/23/2013   NEUTROABS 6.9* 04/21/2013   HGB 11.7* 04/23/2013   HCT 35.8* 04/23/2013   MCV 96.8 04/23/2013   PLT 234 04/23/2013      Chemistry      Component Value Date/Time   NA 138 04/23/2013 0536   NA 137 04/21/2013 0809   K 3.9 04/23/2013 0536   K 4.5 04/21/2013 0809   CL 105 04/23/2013 0536   CL 109* 01/19/2013 1247   CO2 24 04/23/2013 0536   CO2 20* 04/21/2013 0809   BUN 8 04/23/2013 0536   BUN 14.7 04/21/2013 0809   CREATININE 0.59 04/23/2013 0536  CREATININE 0.9 04/21/2013 0809      Component Value Date/Time   CALCIUM 9.0 04/23/2013 0536   CALCIUM 10.0 04/21/2013 0809   ALKPHOS 79 04/21/2013 0809   ALKPHOS 60 03/28/2013 0345   AST 25 04/21/2013 0809   AST 19 03/28/2013 0345   ALT 24 04/21/2013 0809   ALT 11 03/28/2013 0345   BILITOT 0.52 04/21/2013 0809   BILITOT 0.2* 03/28/2013 0345     STUDIES: Ir Veno/ext/uni Left  03/26/2013   *RADIOLOGY REPORT*  IR LEFT LOWER EXTREMITY VENOGRAM; IR VENOGRAM IVC; IR INFUSION OF VENOUS THROMBOLYTIC INITIAL  Date: 03/26/2013  Clinical History: 47 year old male with a 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  He has a history of prior vena thromboembolism with a successful bilateral lower extremity lysis procedure done in May 2013.  He has been on Lovenox therapy which has been successful for until recently when he was incarcerated and missed 5 days and is therapeutic regimen.  He began experiencing progressive left lower extremity swelling shortly thereafter.  He now presents and extremity with massive left lower extremity swelling and extensive superficial and deep venous thrombus.  Procedures Performed: 1. Ultrasound-guided puncture of the left popliteal  vein 2.  Dilated left lower extremity venogram 3.  Successful catheterization of the inferior vena cava 4.  Inferior vena cava gram 5.  Balloon occluded reflux of TPA into the proximal calf veins 6.  Placement of multi side-hole infusion catheter initiation of venous thrombolysis  Interventional Radiologist:  Sterling Big, MD  Sedation: Moderate (conscious) sedation was used.  Six mg Versed, 200 mcg Fentanyl were administered intravenously.  The patient's vital signs were monitored continuously by radiology nursing throughout the procedure.  Sedation Time: 15 minutes  Fluoroscopy time: 16 minutes  Contrast volume: 40 ml Omnipaque-300  Additional medications:  4 mg TPA administered intravenously  PROCEDURE/FINDINGS:   Informed consent was obtained from the patient following explanation of the procedure, risks, benefits and alternatives. The patient understands, agrees and consents for the procedure. All questions were addressed. A time out was performed.  Maximal barrier sterile technique utilized including caps, mask, sterile gowns, sterile gloves, large sterile drape, hand hygiene, and betadine skin prep.  The left popliteal fossa was interrogated with ultrasound.  The popliteal vein is thrombosed.  Local anesthesia was attained by infiltration of 1% lidocaine.  Under direct sonographic guidance, the vessels punctured with a 21-gauge micropuncture needle.  An image was obtained and stored for the medical record.  A Benson wire was advanced into the right femoral vein and a 7- [place] vascular sheath advanced over the wire.  Through the sheath, a limited left lower extremity venogram was performed.  There is extensive thrombus throughout the popliteal and femoral vein. Using a vertebral catheter and glide wire, the catheter was successfully navigated the common femoral vein.  A venogram demonstrates complete occlusion of the external and common iliac veins.  There is an extensive network of collateral vessels.  Thrombus is also seen extending into the great saphenous vein. After an extensive manipulation, the catheter was finally navigated into the inferior vena cava.  An inferior vena cava gram confirms patency of the cava and IVC filter.  A wire was advanced into the inferior vena cava.  The sheath was pulled back to near the insertion point in the popliteal vein.  A 6 mm balloon was advanced into the popliteal vein above the knee and inflated.  A gentle hand injection of contrast material confirmed reflux into  the proximal calf veins which are thrombosed.  A total of 4 mg of TPA was then slowly injected and refluxed into the proximal calf veins.  The sheath was re-advanced into the more distal popliteal vein.  A 90 cm length, 50 cm infusion Unifuse infusion catheter was then advanced over the wire and positioned so that it covered the common, external, common femoral and the majority of the femoral veins.  The sheath and catheter was secured with a post silk suture and a large sterile bandage.  Thrombolysis was initiated at 0.5 mg TNK- tPA per hour.  IMPRESSION:  1.  Left lower extremity venogram demonstrates complete occlusion of the left common and external iliac veins with extensive thrombus from the common femoral vein into the proximal calf veins as well as within the great saphenous vein.  2.  Reflux of 4 mg of TPA into the proximal calf veins.  3.  Placement of a multi side-hole infusion catheter from the common iliac vein into the distal femoral vein.  4.  Initiation of venous thrombolysis.  The patient will return to interventional radiology in 12 - 18 hours for venogram and lysis check.  I expect the patient will acquire angioplasty, and likely stenting of the left common and external iliac veins.  Signed,  Sterling Big, MD [facility]   Original Report Authenticated By: Malachy Moan, M.D.   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  He has a history of prior vena thromboembolism with a successful bilateral lower extremity lysis procedure done in May 2013.  He has been on Lovenox therapy which has been successful for until recently when he was incarcerated and missed 5 days and is therapeutic regimen.  He began experiencing progressive left lower extremity swelling shortly thereafter.  He now presents and extremity with massive left lower extremity swelling and extensive superficial and deep venous thrombus.  Procedures Performed: 1. Ultrasound-guided puncture of the left popliteal vein 2.  Dilated left lower extremity venogram 3.  Successful catheterization of the inferior vena cava 4.  Inferior vena cava gram 5.  Balloon occluded reflux of TPA into the proximal calf veins 6.  Placement of multi side-hole infusion catheter initiation of venous thrombolysis  Interventional Radiologist:  Sterling Big, MD  Sedation: Moderate (conscious) sedation was used.  Six mg Versed, 200 mcg Fentanyl were administered intravenously.  The patient's vital signs were monitored continuously by radiology nursing throughout the procedure.  Sedation Time: 15 minutes  Fluoroscopy time: 16 minutes  Contrast volume: 40 ml Omnipaque-300  Additional medications:  4 mg TPA administered intravenously  PROCEDURE/FINDINGS:   Informed consent was obtained from the patient following explanation of the procedure, risks, benefits and alternatives. The patient understands, agrees and consents for the procedure. All questions were addressed. A time out was performed.  Maximal barrier sterile technique utilized including caps, mask, sterile gowns, sterile gloves, large sterile drape, hand hygiene, and betadine skin prep.  The left popliteal fossa was  interrogated with ultrasound.  The popliteal vein is thrombosed.  Local anesthesia was attained by infiltration of 1% lidocaine.  Under direct sonographic guidance, the vessels punctured with a 21-gauge micropuncture needle.  An image was obtained and stored for the medical record.  A Benson wire was  advanced into the right femoral vein and a 7- [place] vascular sheath advanced over the wire.  Through the sheath, a limited left lower extremity venogram was performed.  There is extensive thrombus throughout the popliteal and femoral vein. Using a vertebral catheter and glide wire, the catheter was successfully navigated the common femoral vein.  A venogram demonstrates complete occlusion of the external and common iliac veins.  There is an extensive network of collateral vessels. Thrombus is also seen extending into the great saphenous vein. After an extensive manipulation, the catheter was finally navigated into the inferior vena cava.  An inferior vena cava gram confirms patency of the cava and IVC filter.  A wire was advanced into the inferior vena cava.  The sheath was pulled back to near the insertion point in the popliteal vein.  A 6 mm balloon was advanced into the popliteal vein above the knee and inflated.  A gentle hand injection of contrast material confirmed reflux into the proximal calf veins which are thrombosed.  A total of 4 mg of TPA was then slowly injected and refluxed into the proximal calf veins.  The sheath was re-advanced into the more distal popliteal vein.  A 90 cm length, 50 cm infusion Unifuse infusion catheter was then advanced over the wire and positioned so that it covered the common, external, common femoral and the majority of the femoral veins.  The sheath and catheter was secured with a post silk suture and a large sterile bandage.  Thrombolysis was initiated at 0.5 mg TNK- tPA per hour.  IMPRESSION:  1.  Left lower extremity venogram demonstrates complete occlusion of the left common  and external iliac veins with extensive thrombus from the common femoral vein into the proximal calf veins as well as within the great saphenous vein.  2.  Reflux of 4 mg of TPA into the proximal calf veins.  3.  Placement of a multi side-hole infusion catheter from the common iliac vein into the distal femoral vein.  4.  Initiation of venous thrombolysis.  The patient will return to interventional radiology in 12 - 18 hours for venogram and lysis check.  I expect the patient will acquire angioplasty, and likely stenting of the left common and external iliac veins.  Signed,  Sterling Big, MD [facility]   Original Report Authenticated By: Malachy Moan, M.D.   Ir Pta Venous Left  03/27/2013   *RADIOLOGY REPORT*  Clinical Data: Extensive left lower extremity iliofemoral DVT and status post initiation of transcatheter thrombolytic therapy yesterday.  Tenecteplase infusion had to be discontinued earlier today due to precipitous drop in fibrinogen to dangerously low levels after overnight thrombolytic therapy.  1.  FOLLOW-UP VENOGRAPHY DURING VENOUS THROMBOLYSIS, FINAL DAY. 2.  MECHANICAL THROMBECTOMY OF THE FEMORAL AND ILIAC VEINS. 3.  BALLOON ANGIOPLASTY OF FEMORAL AND ILIAC VEINS.  Comparison:  Imaging on 03/26/2013.  Sedation: 3.0 mg IV Versed; 150 mcg IV Fentanyl.  Total Moderate Sedation Time: 50 minutes.  Contrast:  65 ml Omnipaque-300  Fluoroscopy Time: 7 minutes and 12 seconds.  Procedure:  The procedure, risks, benefits, and alternatives were explained to the patient.  Questions regarding the procedure were encouraged and answered.  The patient understands and consents to the procedure.  The left popliteal fossa sheath and infusion catheter were prepped with Betadine in a sterile fashion, and a sterile drape was applied covering the operative field.  A sterile gown and sterile gloves were used for the procedure.  Venography was initially  performed through the infusion catheter followed by a sheath.  Infusion catheter was removed over a guidewire.  Mechanical thrombectomy was performed throughout the femoral and iliac veins with the AngioJet device.  Additional venography was performed through the sheath as well as the AngioJet catheter.  Balloon angioplasty was performed in the thigh at the level of the femoral vein as well as throughout the entire common femoral vein, external iliac vein and common iliac vein with a 7 mm x 4 cm Conquest balloon.  Additional venography was performed.  Additional balloon angioplasty was performed of the common femoral vein, external iliac vein and common iliac vein with a 9 mm x 4 cm Mustang balloon.  Additional venography was performed.  The popliteal vein sheath and guidewire were then removed and hemostasis obtained with manual compression.  Complications: None  Findings: Patency of the native deep veins showed significant improvement compared to pre thrombolytic therapy venography. 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  After AngioJet mechanical thrombectomy, marginal improvement was noted in venous patency.  After balloon angioplasty, further improvement was noted at the level of the femoral vein of the thigh, the common femoral vein and external and common iliac veins. Further dilatation of the common femoral vein and iliac veins were performed with a larger balloon.  There is persistent diffuse stenosis present of the deep venous system.  However, stent placement was not felt necessary as it would require complete reconstruction of the iliac veins as well as the common femoral vein and likely necessitate crossing the inguinal ligament.  IMPRESSION: Improved patency of the deep venous system of the left lower extremity including the femoral veins and iliac  veins after overnight thrombolytic therapy.  Thrombolytic therapy had to be discontinued due to precipitous drop in fibrinogen level.  Patency of the veins was further improved today after mechanical thrombectomy and extensive venous angioplasty.  Popliteal vein access was removed upon completion of the procedure.   Original Report Authenticated By: Irish Lack, M.D.   Ir Thrombect Veno Mech Mod Sed  03/27/2013   *RADIOLOGY REPORT*  Clinical Data: Extensive left lower extremity iliofemoral DVT and status post initiation of transcatheter thrombolytic therapy yesterday.  Tenecteplase infusion had to be discontinued earlier today due to precipitous drop in fibrinogen to dangerously low levels after overnight thrombolytic therapy.  1.  FOLLOW-UP VENOGRAPHY DURING VENOUS THROMBOLYSIS, FINAL DAY. 2.  MECHANICAL THROMBECTOMY OF THE FEMORAL AND ILIAC VEINS. 3.  BALLOON ANGIOPLASTY OF FEMORAL AND ILIAC VEINS.  Comparison:  Imaging on 03/26/2013.  Sedation: 3.0 mg IV Versed; 150 mcg IV Fentanyl.  Total Moderate Sedation Time: 50 minutes.  Contrast:  65 ml Omnipaque-300  Fluoroscopy Time: 7 minutes and 12 seconds.  Procedure:  The procedure, risks, benefits, and alternatives were explained to the patient.  Questions regarding the procedure were encouraged and answered.  The patient understands and consents to the procedure.  The left popliteal fossa sheath and infusion catheter were prepped with Betadine in a sterile fashion, and a sterile drape was applied covering the operative field.  A sterile gown and sterile gloves were used for the procedure.  Venography was initially performed through the infusion catheter followed by a sheath.  Infusion catheter was removed over a guidewire.  Mechanical thrombectomy was performed throughout the femoral and iliac veins with the AngioJet device.  Additional venography  was performed through the sheath as well as the AngioJet catheter.  Balloon angioplasty was performed in the thigh  at the level of the femoral vein as well as throughout the entire common femoral vein, external iliac vein and common iliac vein with a 7 mm x 4 cm Conquest balloon.  Additional venography was performed.  Additional balloon angioplasty was performed of the common femoral vein, external iliac vein and common iliac vein with a 9 mm x 4 cm Mustang balloon.  Additional venography was performed.  The popliteal vein sheath and guidewire were then removed and hemostasis obtained with manual compression.  Complications: None  Findings: Patency of the native deep veins showed significant improvement compared to pre thrombolytic therapy venography. 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  After AngioJet mechanical thrombectomy, marginal improvement was noted in venous patency.  After balloon angioplasty, further improvement was noted at the level of the femoral vein of the thigh, the common femoral vein and external and common iliac veins. Further dilatation of the common femoral vein and iliac veins were performed with a larger balloon.  There is persistent diffuse stenosis present of the deep venous system.  However, stent placement was not felt necessary as it would require complete reconstruction of the iliac veins as well as the common femoral vein and likely necessitate crossing the inguinal ligament.  IMPRESSION: Improved patency of the deep venous system of the left lower extremity including the femoral veins and iliac veins after overnight thrombolytic therapy.  Thrombolytic therapy had to be discontinued due to precipitous drop in fibrinogen level.  Patency of the veins was further improved today after mechanical thrombectomy and extensive venous angioplasty.  Popliteal vein access was removed upon completion of the procedure.   Original Report Authenticated By:  Irish Lack, M.D.   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  He has a history of prior vena thromboembolism with a successful bilateral lower extremity lysis procedure done in May 2013.  He has been on Lovenox therapy which has been successful for until recently when he was incarcerated and missed 5 days and is therapeutic regimen.  He began experiencing progressive left lower extremity swelling shortly thereafter.  He now presents and extremity with massive left lower extremity swelling and extensive superficial and deep venous thrombus.  Procedures Performed: 1. Ultrasound-guided puncture of the left popliteal vein 2.  Dilated left lower extremity venogram 3.  Successful catheterization of the inferior vena cava 4.  Inferior vena cava gram 5.  Balloon occluded reflux of TPA into the proximal calf veins 6.  Placement of multi side-hole infusion catheter initiation of venous thrombolysis  Interventional Radiologist:  Sterling Big, MD  Sedation: Moderate (conscious) sedation was used.  Six mg Versed, 200 mcg Fentanyl were administered intravenously.  The patient's vital signs were monitored continuously by radiology nursing throughout the procedure.  Sedation Time: 15 minutes  Fluoroscopy time: 16 minutes  Contrast volume: 40 ml Omnipaque-300  Additional medications:  4 mg TPA administered intravenously  PROCEDURE/FINDINGS:   Informed consent was obtained from the patient following explanation of the procedure, risks, benefits and alternatives. The patient understands, agrees  and consents for the procedure. All questions were addressed. A time out was performed.  Maximal barrier sterile technique utilized including caps, mask, sterile gowns, sterile gloves, large  sterile drape, hand hygiene, and betadine skin prep.  The left popliteal fossa was interrogated with ultrasound.  The popliteal vein is thrombosed.  Local anesthesia was attained by infiltration of 1% lidocaine.  Under direct sonographic guidance, the vessels punctured with a 21-gauge micropuncture needle.  An image was obtained and stored for the medical record.  A Benson wire was advanced into the right femoral vein and a 7- [place] vascular sheath advanced over the wire.  Through the sheath, a limited left lower extremity venogram was performed.  There is extensive thrombus throughout the popliteal and femoral vein. Using a vertebral catheter and glide wire, the catheter was successfully navigated the common femoral vein.  A venogram demonstrates complete occlusion of the external and common iliac veins.  There is an extensive network of collateral vessels. Thrombus is also seen extending into the great saphenous vein. After an extensive manipulation, the catheter was finally navigated into the inferior vena cava.  An inferior vena cava gram confirms patency of the cava and IVC filter.  A wire was advanced into the inferior vena cava.  The sheath was pulled back to near the insertion point in the popliteal vein.  A 6 mm balloon was advanced into the popliteal vein above the knee and inflated.  A gentle hand injection of contrast material confirmed reflux into the proximal calf veins which are thrombosed.  A total of 4 mg of TPA was then slowly injected and refluxed into the proximal calf veins.  The sheath was re-advanced into the more distal popliteal vein.  A 90 cm length, 50 cm infusion Unifuse infusion catheter was then advanced over the wire and positioned so that it covered the common, external, common femoral and the majority of the femoral veins.  The sheath and catheter was secured with a post silk suture and a large sterile bandage.  Thrombolysis was initiated at 0.5 mg TNK- tPA per hour.  IMPRESSION:   1.  Left lower extremity venogram demonstrates complete occlusion of the left common and external iliac veins with extensive thrombus from the common femoral vein into the proximal calf veins as well as within the great saphenous vein.  2.  Reflux of 4 mg of TPA into the proximal calf veins.  3.  Placement of a multi side-hole infusion catheter from the common iliac vein into the distal femoral vein.  4.  Initiation of venous thrombolysis.  The patient will return to interventional radiology in 12 - 18 hours for venogram and lysis check.  I expect the patient will acquire angioplasty, and likely stenting of the left common and external iliac veins.  Signed,  Sterling Big, MD [facility] Radiology   Original Report Authenticated By: Malachy Moan, M.D.   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  He has a history of prior vena thromboembolism with a successful bilateral lower extremity lysis procedure done in May 2013.  He has been on Lovenox therapy which has been successful for until recently when he was incarcerated and missed 5 days and is therapeutic regimen.  He began experiencing progressive  left lower extremity swelling shortly thereafter.  He now presents and extremity with massive left lower extremity swelling and extensive superficial and deep venous thrombus.  Procedures Performed: 1. Ultrasound-guided puncture of the left popliteal vein 2.  Dilated left lower extremity venogram 3.  Successful catheterization of the inferior vena cava 4.  Inferior vena cava gram 5.  Balloon occluded reflux of TPA into the proximal calf veins 6.  Placement of multi side-hole infusion catheter initiation of venous  thrombolysis  Interventional Radiologist:  Sterling Big, MD  Sedation: Moderate (conscious) sedation was used.  Six mg Versed, 200 mcg Fentanyl were administered intravenously.  The patient's vital signs were monitored continuously by radiology nursing throughout the procedure.  Sedation Time: 15 minutes  Fluoroscopy time: 16 minutes  Contrast volume: 40 ml Omnipaque-300  Additional medications:  4 mg TPA administered intravenously  PROCEDURE/FINDINGS:   Informed consent was obtained from the patient following explanation of the procedure, risks, benefits and alternatives. The patient understands, agrees and consents for the procedure. All questions were addressed. A time out was performed.  Maximal barrier sterile technique utilized including caps, mask, sterile gowns, sterile gloves, large sterile drape, hand hygiene, and betadine skin prep.  The left popliteal fossa was interrogated with ultrasound.  The popliteal vein is thrombosed.  Local anesthesia was attained by infiltration of 1% lidocaine.  Under direct sonographic guidance, the vessels punctured with a 21-gauge micropuncture needle.  An image was obtained and stored for the medical record.  A Benson wire was advanced into the right femoral vein and a 7- [place] vascular sheath advanced over the wire.  Through the sheath, a limited left lower extremity venogram was performed.  There is extensive thrombus throughout the popliteal and femoral vein. Using a vertebral catheter and glide wire, the catheter was successfully navigated the common femoral vein.  A venogram demonstrates complete occlusion of the external and common iliac veins.  There is an extensive network of collateral vessels. Thrombus is also seen extending into the great saphenous vein. After an extensive manipulation, the catheter was finally navigated into the inferior vena cava.  An inferior vena cava gram confirms patency of the cava and IVC filter.  A wire was advanced into the  inferior vena cava.  The sheath was pulled back to near the insertion point in the popliteal vein.  A 6 mm balloon was advanced into the popliteal vein above the knee and inflated.  A gentle hand injection of contrast material confirmed reflux into the proximal calf veins which are thrombosed.  A total of 4 mg of TPA was then slowly injected and refluxed into the proximal calf veins.  The sheath was re-advanced into the more distal popliteal vein.  A 90 cm length, 50 cm infusion Unifuse infusion catheter was then advanced over the wire and positioned so that it covered the common, external, common femoral and the majority of the femoral veins.  The sheath and catheter was secured with a post silk suture and a large sterile bandage.  Thrombolysis was initiated at 0.5 mg TNK- tPA per hour.  IMPRESSION:  1.  Left lower extremity venogram demonstrates complete occlusion of the left common and external iliac veins with extensive thrombus from the common femoral vein into the proximal calf veins as well as within the great saphenous vein.  2.  Reflux of 4 mg of TPA into the proximal calf veins.  3.  Placement of a multi side-hole infusion catheter from the common iliac vein into  the distal femoral vein.  4.  Initiation of venous thrombolysis.  The patient will return to interventional radiology in 12 - 18 hours for venogram and lysis check.  I expect the patient will acquire angioplasty, and likely stenting of the left common and external iliac veins.  Signed,  Sterling Big, MD [facility] Radiology   Original Report Authenticated By: Malachy Moan, M.D.   Ir Rande Lawman F/u Eval Art/ven Final Day (ms)  03/27/2013   *RADIOLOGY REPORT*  Clinical Data: Extensive left lower extremity iliofemoral DVT and status post initiation of transcatheter thrombolytic therapy yesterday.  Tenecteplase infusion had to be discontinued earlier today due to precipitous drop in fibrinogen  to dangerously low levels after overnight thrombolytic therapy.  1.  FOLLOW-UP VENOGRAPHY DURING VENOUS THROMBOLYSIS, FINAL DAY. 2.  MECHANICAL THROMBECTOMY OF THE FEMORAL AND ILIAC VEINS. 3.  BALLOON ANGIOPLASTY OF FEMORAL AND ILIAC VEINS.  Comparison:  Imaging on 03/26/2013.  Sedation: 3.0 mg IV Versed; 150 mcg IV Fentanyl.  Total Moderate Sedation Time: 50 minutes.  Contrast:  65 ml Omnipaque-300  Fluoroscopy Time: 7 minutes and 12 seconds.  Procedure:  The procedure, risks, benefits, and alternatives were explained to the patient.  Questions regarding the procedure were encouraged and answered.  The patient understands and consents to the procedure.  The left popliteal fossa sheath and infusion catheter were prepped with Betadine in a sterile fashion, and a sterile drape was applied covering the operative field.  A sterile gown and sterile gloves were used for the procedure.  Venography was initially performed through the infusion catheter followed by a sheath.  Infusion catheter was removed over a guidewire.  Mechanical thrombectomy was performed throughout the femoral and iliac veins with the AngioJet device.  Additional venography was performed through the sheath as well as the AngioJet catheter.  Balloon angioplasty was performed in the thigh at the level of the femoral vein as well as throughout the entire common femoral vein, external iliac vein and common iliac vein with a 7 mm x 4 cm Conquest balloon.  Additional venography was performed.  Additional balloon angioplasty was performed of the common femoral vein, external iliac vein and common iliac vein with a 9 mm x 4 cm Mustang balloon.  Additional venography was performed.  The popliteal vein sheath and guidewire were then removed and hemostasis obtained with manual compression.  Complications: None  Findings: Patency of the native deep veins showed significant improvement compared to pre thrombolytic therapy venography. Continuous in-line flow was  present throughout the femoral and iliac veins with some persistent filling of collateral veins as well as a diffusely stenotic venous channel due to chronic thrombus.  After AngioJet mechanical thrombectomy, marginal improvement was noted in venous patency.  After balloon angioplasty, further improvement was noted at the level of the femoral vein of the thigh, the common femoral vein and external and common iliac veins. Further dilatation of the common femoral vein and iliac veins were performed with a larger balloon.  There is persistent diffuse stenosis present of the deep venous system.  However, stent placement was not felt necessary as it would require complete reconstruction of the iliac veins as well as the common femoral vein and likely necessitate crossing the inguinal ligament.  IMPRESSION: Improved patency of the deep venous system of the left lower extremity including the femoral veins and iliac veins after overnight thrombolytic therapy.  Thrombolytic therapy had to be discontinued due to precipitous drop in fibrinogen level.  Patency of the veins was further improved today after mechanical thrombectomy and extensive venous angioplasty.  Popliteal vein access was removed upon completion of the procedure.   Original Report Authenticated By: Irish Lack, M.D.   ASSESSMENT AND PLAN:  1. Cellulitis of LLE with +3 edema,  erythema in middle of lower leg, warmth over the area of erythema, diminished pulses. Admit to the hospital for cellulitis treatment with IV antibiotics. 1. Left lower extremity DVT.  - Patient has previously been on Coumadin, but was not compliant with lab draws been taking his medication as directed. He then had propagation of this clot while on Xarelto which he not always took.Marland Kitchen He is now on Lovenox 90 mg twice a day and tolerating this well. His pain has improved and he has decreased swelling and redness to his left lower extremity. He had in July 2014 thrombolytic therapy,  mechanical thrombectomy and extensive venous angioplasty.  Recommend that he continue Xarelto.  2. 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  3. Smoking.  Recommend to stop smoking. 4. Alcohol abuse. Recommend stop drinking.  Myra Rude, MD   04/23/2013 10:44 PM

## 2013-04-23 NOTE — Progress Notes (Signed)
[facility] PROGRESS NOTE  [name] AVW:[id number] DOB: 02-18-1966 DOA: 04/21/2013 PCP: Default, Provider, MD  Assessment/Plan: #1 left leg postphlebitic syndrome/questionable probable cellulitis Clinical improvement. Decreased erythema decreased swelling. Deep lower extremity elevated. Change empiric IV vancomycin to oral doxycycline. Follow.  #2 history of recent extensive left lower extremity DVT Status post TPA/thrombectomy. Patient's case has been discussed with Dr. Bonnielee Haff (the radiology.) Continue xaralto.   #3 history of alcohol abuse Has been sober for the past one to 2 months. Continue thiamine.  #4 tobacco abuse Continue nicotine patch.  Code Status: Full Family Communication: Updated patient no family at bedside. Disposition Plan: Home when medically stable.   Consultants:  None  Procedures:  Lower extremity Dopplers 04/21/2013  Antibiotics:  IV vancomycin 04/22/2013---> 04/23/13  Oral doxycycline 04/23/13  HPI/Subjective: Patient states he's feeling better. No complaints.  Objective: Filed Vitals:   04/23/13 0620  BP: 122/72  Pulse: 47  Temp: 97.4 F (36.3 C)  Resp: 16    Intake/Output Summary (Last 24 hours) at 04/23/13 1112 Last data filed at 04/23/13 0830  Gross per 24 hour  Intake   1880 ml  Output    200 ml  Net   1680 ml   Filed Weights   04/21/13 1100  Weight: 88.043 kg (194 lb 1.6 oz)    Exam:   General:  NAD  Cardiovascular:RRR  Respiratory: CTAB  Abdomen: Soft, nontender, nondistended, positive bowel sounds.  Musculoskeletal: Left lower extremity with decreased erythema, decreased warmth, decreased tenderness to palpation, decreased swelling.  Data Reviewed: Basic Metabolic Panel:  Recent Labs Lab 04/21/13 0809 04/22/13 0540 04/23/13 0536  NA 137 139 138  K 4.5 3.7 3.9  CL  --  107 105  CO2 20* 24 24  GLUCOSE 98 89 89  BUN 14.7 13 8   CREATININE 0.9 0.64 0.59  CALCIUM 10.0 8.7 9.0   Liver Function  Tests:  Recent Labs Lab 04/21/13 0809  AST 25  ALT 24  ALKPHOS 79  BILITOT 0.52  PROT 8.4*  ALBUMIN 4.1   No results found for this basename: LIPASE, AMYLASE,  in the last 168 hours No results found for this basename: AMMONIA,  in the last 168 hours CBC:  Recent Labs Lab 04/21/13 0809 04/22/13 0540 04/23/13 0536  WBC 9.5 6.5 5.3  NEUTROABS 6.9*  --   --   HGB 12.2* 11.2* 11.7*  HCT 37.1* 34.2* 35.8*  MCV 96.1 95.8 96.8  PLT 292 236 234   Cardiac Enzymes: No results found for this basename: CKTOTAL, CKMB, CKMBINDEX, TROPONINI,  in the last 168 hours BNP (last 3 results) No results found for this basename: PROBNP,  in the last 8760 hours CBG: No results found for this basename: GLUCAP,  in the last 168 hours  No results found for this or any previous visit (from the past 240 hour(s)).   Studies: No results found.  Scheduled Meds: . folic acid  1 mg Oral Daily  . nicotine  21 mg Transdermal Daily  . [START ON 04/24/2013] Rivaroxaban  20 mg Oral Q breakfast  . vancomycin  1,500 mg Intravenous Q12H   Continuous Infusions:   Principal Problem:   Post-phlebitic syndrome Active Problems:   Alcohol abuse   Tobacco abuse   Lupus anticoagulant disorder   Deep vein thrombosis   Cellulitis of leg, left    Time spent: . 35 mins    [facility]  [facility] Pager [phone]. If 7PM-7AM, please contact night-coverage at [email or web address], password [facility] 04/23/2013,  11:12 AM  LOS: 2 days

## 2013-04-24 LAB — BASIC METABOLIC PANEL
Calcium: 9.4 mg/dL (ref 8.4–10.5)
GFR calc Af Amer: >90 mL/min (ref >90)
GFR calc non Af Amer: >90 mL/min (ref >90)
Glucose, Bld: 90 mg/dL (ref 70–99)
Potassium: 3.8 mEq/L (ref 3.5–5.1)
Sodium: 140 mEq/L (ref 135–145)

## 2013-04-24 NOTE — Evaluation (Signed)
Occupational Therapy Evaluation Patient Details Name: [name] MRN: [id number] DOB: 01/21/1966 Today's Date: 04/24/2013 Time: 1478-2956 OT Time Calculation (min): 19 min  OT Assessment / Plan / Recommendation History of present illness 47 y.o. 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   Clinical Impression   Pt is currently modified independent with ADL and reports just soreness in L calf. He does not have any further OT needs at this time.    OT Assessment  Patient does not need any further OT services    Follow Up Recommendations  No OT follow up    Barriers to Discharge      Equipment Recommendations  None recommended by OT    Recommendations for Other Services    Frequency       Precautions / Restrictions Precautions Precautions: None Restrictions Weight Bearing Restrictions: No   Pertinent Vitals/Pain Soreness L calf. Reposition, elevate    ADL  Eating/Feeding: Simulated;Independent Where Assessed - Eating/Feeding: Chair Grooming: Performed;Wash/dry hands;Independent Where Assessed - Grooming: Unsupported standing Upper Body Bathing: Simulated;Chest;Right arm;Left arm;Abdomen;Independent Where Assessed - Upper Body Bathing: Unsupported sitting Lower Body Bathing: Simulated;Modified independent Where Assessed - Lower Body Bathing: Supported sit to stand Upper Body Dressing: Simulated;Independent Where Assessed - Upper Body Dressing: Unsupported sitting Lower Body Dressing: Simulated;Modified independent Where Assessed - Lower Body Dressing: Supported sit to Pharmacist, hospital: Simulated;Modified independent Toilet Transfer Method: Sit to stand Toileting - Clothing Manipulation and Hygiene: Simulated;Independent Where Assessed -  Toileting Clothing Manipulation and Hygiene: Standing ADL Comments: Pt states at his tent he uses wet wipes to clean himself. He has sleeping bags and pillows for bedding.  Educated on need to elevate L LE and placed pillow under LE. Per pt he took a shower this morning without difficulty. He states he can get around ok just tenderness in L calf.     OT Diagnosis:    OT Problem List:   OT Treatment Interventions:     OT Goals(Current goals can be found in the care plan section) Acute Rehab OT Goals Patient Stated Goal: wants to know what his discharge plan is (social work on board)  Visit Information  Last OT Received On: 04/24/13 Assistance Needed: +1 History of Present Illness: 47 y.o. 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       Prior Functioning     Home Living Family/patient expects to be discharged to:: Unsure (SNF versus his tent. Social Work is working with pt on plan) Additional Comments: lives in tent Prior Function Level of Independence: Independent (was using a cane but not needing it anymore per his report) Communication Communication: No difficulties         Vision/Perception     Cognition  Cognition Arousal/Alertness: Awake/alert Behavior During Therapy: WFL for tasks assessed/performed Overall Cognitive Status: History of cognitive impairments - at baseline    Extremity/Trunk Assessment Upper Extremity Assessment Upper Extremity Assessment: Overall WFL for tasks assessed     Mobility Bed Mobility Bed Mobility: Supine to Sit Supine to Sit: 6: Modified independent (Device/Increase time) Transfers Transfers: Sit to Stand;Stand to  Sit Sit to Stand: 6: Modified independent (Device/Increase time);From bed Stand to Sit: 6:  Modified independent (Device/Increase time);To chair/3-in-1     Exercise     Balance Balance Balance Assessed: Yes Dynamic Standing Balance Dynamic Standing - Level of Assistance: 7: Independent (washing hands at sink, reaching for paper towel)   End of Session OT - End of Session Activity Tolerance: Patient tolerated treatment well Patient left: in chair;with call bell/phone within reach  GO     Lennox Laity [phone] 04/24/2013, 12:17 PM

## 2013-04-24 NOTE — Evaluation (Signed)
Physical Therapy Evaluation Patient Details Name: [name] MRN: [id number] DOB: 1965/11/02 Today's Date: 04/24/2013 Time:  -     PT Assessment / Plan / Recommendation History of Present Illness  47 y.o. 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  Clinical Impression  Pt is independent in mobility and gait and levels and stairs    PT Assessment  Patent does not need any further PT services    Follow Up Recommendations  No PT follow up    Does the patient have the potential to tolerate intense rehabilitation      Barriers to Discharge        Equipment Recommendations  None recommended by PT    Recommendations for Other Services     Frequency      Precautions / Restrictions Precautions Precautions: None Restrictions Weight Bearing Restrictions: No   Pertinent Vitals/Pain C/o generalized back and hip pain  "I think its from laying down too much"      Mobility  Bed Mobility Bed Mobility: Supine to Sit Supine to Sit: 6: Modified independent (Device/Increase time) Transfers Sit to Stand: From bed;7: Independent Ambulation/Gait Ambulation/Gait Assistance: 7: Independent Ambulation Distance (Feet): 400 Feet Assistive device: None Gait Pattern: Within Functional Limits Gait velocity: WFL Stairs: Yes Stairs Assistance: 6: Modified independent (Device/Increase time) Stair Management Technique: One rail Right Number of Stairs: 4 Wheelchair Mobility Wheelchair Mobility: No    Exercises     PT Diagnosis:    PT Problem List:   PT Treatment Interventions:       PT Goals(Current goals can be found in the care plan section) Acute Rehab PT Goals Patient Stated Goal: wants to know what his discharge plan is (social work on board)  Visit  Information  Last PT Received On: 04/24/13 Assistance Needed: +1 History of Present Illness: 47 y.o. 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       Prior Functioning  Home Living Family/patient expects to be discharged to:: Unsure (SNF versus his tent. Social Work is working with pt on plan) Additional Comments: lives in tent Prior Function Level of Independence: Independent (was using a cane but not needing it anymore per his report) Communication Communication: No difficulties    Cognition  Cognition Arousal/Alertness: Awake/alert Behavior During Therapy: WFL for tasks assessed/performed Overall Cognitive Status: History of cognitive impairments - at baseline    Extremity/Trunk Assessment Upper Extremity Assessment Upper Extremity Assessment: Overall WFL for tasks assessed Lower Extremity Assessment Lower Extremity Assessment: LLE deficits/detail LLE Deficits / Details: pt with edema and hemoderin staining in LLE ,but pt reports his leg is better than it was   Balance Balance Balance Assessed: Yes Static Sitting Balance Static Sitting - Balance Support: No upper extremity supported Static Sitting - Level of Assistance: 7: Independent Dynamic Standing Balance Dynamic Standing - Level of Assistance: 7: Independent Standardized Balance Assessment Standardized Balance Assessment: Dynamic Gait Index Dynamic Gait Index Level Surface: Normal Change in Gait Speed: Normal Gait with Horizontal Head Turns: Normal Gait with Vertical Head Turns: Normal Gait and Pivot Turn: Normal Step Over Obstacle: Normal  Step Around Obstacles: Normal Steps: Normal Total Score: 24  End of Session PT - End of Session Activity Tolerance: Patient tolerated treatment  well Patient left:  (in room, pt did not want to sit or lie down) Nurse Communication: Mobility status  GP    Rosey Bath K. Manson Passey, [place] [phone] 04/24/2013, 3:19 PM

## 2013-04-24 NOTE — Progress Notes (Signed)
[facility] PROGRESS NOTE  [name] QIH:[id number] DOB: August 22, 1966 DOA: 04/21/2013 PCP: Default, Provider, MD  Assessment/Plan: #1 left leg postphlebitic syndrome/questionable probable cellulitis Clinical improvement. Decreased erythema decreased swelling. Deep lower extremity elevated. Continue oral doxycycline. Follow.  #2 history of recent extensive left lower extremity DVT Status post TPA/thrombectomy. Patient's case has been discussed with Dr. Bonnielee Haff (the radiology.) Continue xaralto.   #3 history of alcohol abuse Has been sober for the past one to 2 months. Continue thiamine.  #4 tobacco abuse Continue nicotine patch.  Code Status: Full Family Communication: Updated patient no family at bedside. Disposition Plan: SNF when medically stable hopefully [place]mm.   Consultants:  None  Procedures:  Lower extremity Dopplers 04/21/2013  Antibiotics:  IV vancomycin 04/22/2013---> 04/23/13  Oral doxycycline 04/23/13  HPI/Subjective: Patient states he's feeling better. No complaints.  Objective: Filed Vitals:   04/24/13 1519  BP: 101/61  Pulse: 66  Temp: 97.8 F (36.6 C)  Resp: 18   No intake or output data in the 24 hours ending 04/24/13 1551 Filed Weights   04/21/13 1100  Weight: 88.043 kg (194 lb 1.6 oz)    Exam:   General:  NAD  Cardiovascular:RRR  Respiratory: CTAB  Abdomen: Soft, nontender, nondistended, positive bowel sounds.  Musculoskeletal: Left lower extremity with decreased erythema, decreased warmth, decreased tenderness to palpation, decreased swelling.  Data Reviewed: Basic Metabolic Panel:  Recent Labs Lab 04/21/13 0809 04/22/13 0540 04/23/13 0536 04/24/13 0448  NA 137 139 138 140  K 4.5 3.7 3.9 3.8  CL  --  107 105 106  CO2 20* 24 24 27   GLUCOSE 98 89 89 90  BUN 14.7 13 8 8   CREATININE 0.9 0.64 0.59 0.66  CALCIUM 10.0 8.7 9.0 9.4   Liver Function Tests:  Recent Labs Lab 04/21/13 0809  AST 25  ALT 24  ALKPHOS 79   BILITOT 0.52  PROT 8.4*  ALBUMIN 4.1   No results found for this basename: LIPASE, AMYLASE,  in the last 168 hours No results found for this basename: AMMONIA,  in the last 168 hours CBC:  Recent Labs Lab 04/21/13 0809 04/22/13 0540 04/23/13 0536  WBC 9.5 6.5 5.3  NEUTROABS 6.9*  --   --   HGB 12.2* 11.2* 11.7*  HCT 37.1* 34.2* 35.8*  MCV 96.1 95.8 96.8  PLT 292 236 234   Cardiac Enzymes: No results found for this basename: CKTOTAL, CKMB, CKMBINDEX, TROPONINI,  in the last 168 hours BNP (last 3 results) No results found for this basename: PROBNP,  in the last 8760 hours CBG: No results found for this basename: GLUCAP,  in the last 168 hours  No results found for this or any previous visit (from the past 240 hour(s)).   Studies: No results found.  Scheduled Meds: . doxycycline  100 mg Oral Q12H  . folic acid  1 mg Oral Daily  . nicotine  21 mg Transdermal Daily  . Rivaroxaban  20 mg Oral Q breakfast   Continuous Infusions:   Principal Problem:   Post-phlebitic syndrome Active Problems:   Alcohol abuse   Tobacco abuse   Lupus anticoagulant disorder   Deep vein thrombosis   Cellulitis of leg, left    Time spent: . 35 mins    [facility]  [facility] Pager [phone]. If 7PM-7AM, please contact night-coverage at [email or web address], password [facility] 04/24/2013, 3:51 PM  LOS: 3 days

## 2013-04-25 MED ORDER — OXYCODONE-ACETAMINOPHEN 5-325 MG PO TABS: 2.0000 | ORAL_TABLET | Freq: Four times a day (QID) | ORAL | Status: DC | PRN

## 2013-04-25 MED ORDER — RIVAROXABAN 20 MG PO TABS: 20.0000 mg | ORAL_TABLET | Freq: Every day | ORAL | Status: DC

## 2013-04-25 MED ORDER — TRAZODONE HCL 50 MG PO TABS: 50.0000 mg | ORAL_TABLET | Freq: Every evening | ORAL | Status: DC | PRN

## 2013-04-25 MED ORDER — NICOTINE 21 MG/24HR TD PT24: 1.0000 | MEDICATED_PATCH | Freq: Every day | TRANSDERMAL | Status: DC

## 2013-04-25 MED ORDER — DOXYCYCLINE HYCLATE 100 MG PO TABS: 100.0000 mg | ORAL_TABLET | Freq: Two times a day (BID) | ORAL | Status: AC

## 2013-04-25 NOTE — Care Management Note (Signed)
    Page 1 of 1   04/25/2013     12:30:48 PM   CARE MANAGEMENT NOTE 04/25/2013  Patient:  [name],[name]   Account Number:  [id number]  Date Initiated:  04/22/2013  Documentation initiated by:  Algernon Huxley  Subjective/Objective Assessment:   47 year old male admitted with cellulitis.     Action/Plan:   Homelesss at this time, CSW assisting with d/c venue.   Anticipated DC Date:  04/25/2013   Anticipated DC Plan:  SKILLED NURSING FACILITY  In-house referral  Clinical Social Worker      DC Planning Services  CM consult      Choice offered to / List presented to:             Status of service:  Completed, signed off Medicare Important Message given?  NA - LOS <3 / Initial given by admissions (If response is "NO", the following Medicare IM given date fields will be blank) Date Medicare IM given:   Date Additional Medicare IM given:    Discharge Disposition:    Per UR Regulation:  Reviewed for med. necessity/level of care/duration of stay  If discussed at Long Length of Stay Meetings, dates discussed:    Comments:  04/25/2013 Colleen Can BSN RN CCM [phone] Pt has no HH needs or DME needs. States he has Medicaid benefits with prescription coverage. Plans to go to chosen MD for follow up health care. Stes he will return to his tent dwelling in [place].   04/21/13 RUTH MIRINGU RN BSN Consult placed for medication needs, pt has medicaid hence does not qualify for [facility] program.

## 2013-04-25 NOTE — Clinical Social Work Note (Signed)
Clinical Social Work currently working with patient at [facility] (outpatient setting).  Per note by CSW Merlene Pulling, patient not eligible for SNF placement.  CSW met with patient at bedside to review plans for establishing PCP and medication coverage.  CSW contacted patient's Medicaid caseworker, patient has full Medicaid benefits and may choose any PCP that accepts Medicaid.  Mr. Mcclenahan plans to have walk in appointment at Dr. Lerry Liner clinic on [address] (Ph# [phone]).  CSW encouraged patient to set up appointment for in the next 2 weeks.  Mr. Beegle plans to go to [facility] to fill any necessary prescriptions.  CSW provided patient with CSW contact information to follow up in outpatient setting.  Kathrin Penner, MSW, LCSW Clinical Social Worker [facility] [phone]

## 2013-04-25 NOTE — Discharge Summary (Signed)
Physician Discharge Summary  [name] ZOX:[id number] DOB: 1966-07-25 DOA: 04/21/2013  PCP: Default, Provider, MD  Admit date: 04/21/2013 Discharge date: 04/25/2013  Time spent: 65 minutes  Recommendations for Outpatient Follow-up:  1. Patient is to pick a PCP and followup in one to 2 weeks. On followup patient's left lower extremity would need to be reassessed. Patient also needs a basic metabolic profile done to followup on electrolytes and renal function.  Discharge Diagnoses:  Principal Problem:   Post-phlebitic syndrome Active Problems:   Alcohol abuse   Tobacco abuse   Lupus anticoagulant disorder   Deep vein thrombosis   Cellulitis of leg, left   Discharge Condition: Stable and improved  Diet recommendation: regular  Filed Weights   04/21/13 1100  Weight: 88.043 kg (194 lb 1.6 oz)    History of present illness:  [name] is a 47 y.o. male with PMH of alcoholism, tobacco abuse, L leg DVT, lupus anticoagulant, h/o TPA/ Mechanical Thrombectomy of the femoral and iliac veins - Balloon Angioplasty of Femoral and Iliac veins, this was done at [facility] 3 weeks ago, s/p IVC filter and discharged to SNF on 7/31, was just discharged from SNF 2 days ago and is currently homeless, living in a tent.  He was seen at the [facility] today by [facility] and referred to the floor as a direct admit for management for cellulitis.  Pt reports chronic L leg swelling with redness and intermittent pain for 2-61months, he tells me that his leg is not too different from how it appeared at discharge on 7/31, but does reports occasional increased swelling esp when he's up on his feet most of the day.  No fevers or chills.  Pt reports compliance with Xarelto.   Hospital Course:  #1 left leg postphlebitic syndrome/questionable probable cellulitis  Patient had presented with intermittent left lower extremity swelling and redness over the past 2-3 months. Patient did state he been compliant with his  oral to which she was started on full left lower extremity DVT with a prior history of mechanical thrombectomy for femoral and iliac veins and balloon angioplasty of femoral and iliac veins. Lower extremity Dopplers were done which had some concern for recurrent versus old DVTs. Dr. Jomarie Longs the admitting physician spoke with Dr. Scharlene Gloss of interventional radiology did not recommend any intervention since extensive procedures on the left lower extremity were done recently. It was felt was seen on the Dopplers were likely prior clots. Patient's case was also discussed with oncology who recommended continuing xaralto only. Patient was placed empirically on IV antibiotics for possible cellulitis. Patient was also instructed to elevate lower extremity. Patient improved clinically on IV antibiotics and subsequently was transitioned to oral antibiotics. Patient's edema erythema and pain improved. Patient be discharged home on 4 more days of oral doxycycline to complete a one-week course of antibiotic therapy. Patient was maintained on his xaralto and will be discharged on the xarelto as well. It was felt patient's symptoms were likely secondary to postphlebitic syndrome. Patient has been covered with antibiotics in case of probable cellulitis. Patient is to pick a PCP and is to followup in one week post discharge.  #2 history of recent extensive left lower extremity DVT  Status post TPA/thrombectomy. Patient's case has been discussed with Dr. Bonnielee Haff (the radiology.) Continued on xaralto.  #3 history of alcohol abuse  Has been sober for the past one to 2 months. Continue thiamine. Patient had no signs or symptoms of withdrawal. She remained in stable condition  throughout the hospitalization.  #4 tobacco abuse  Patient was maintained on a nicotine patch.   Procedures: Lower extremity Dopplers 04/21/2013     Consultations:  None  Discharge Exam: Filed Vitals:   04/25/13 0503  BP: 105/68  Pulse: 69  Temp:  98 F (36.7 C)  Resp: 16    General: NAD Cardiovascular: RRR Respiratory: CTAB  Discharge Instructions  Discharge Orders   Future Orders Complete By Expires   Diet general  As directed    Discharge instructions  As directed    Comments:     Follow up with PCP in 1 week. Keep left leg elevated above heart when laying or sitting down.   Increase activity slowly  As directed        Medication List         doxycycline 100 MG tablet  Commonly known as:  VIBRA-TABS  Take 1 tablet (100 mg total) by mouth every 12 (twelve) hours. Take for 4 days then stop.     folic acid 1 MG tablet  Commonly known as:  FOLVITE  Take 1 tablet (1 mg total) by mouth daily.     nicotine 21 mg/24hr patch  Commonly known as:  NICODERM CQ - dosed in mg/24 hours  Place 1 patch onto the skin daily.     oxyCODONE-acetaminophen 5-325 MG per tablet  Commonly known as:  PERCOCET/ROXICET  Take 2 tablets by mouth every 6 (six) hours as needed for pain.     Rivaroxaban 20 MG Tabs tablet  Commonly known as:  XARELTO  Take 1 tablet (20 mg total) by mouth daily.     traZODone 50 MG tablet  Commonly known as:  DESYREL  Take 1 tablet (50 mg total) by mouth at bedtime as needed for sleep.       No Known Allergies     Follow-up Information   Schedule an appointment as soon as possible for a visit in 1 week to follow up. (f/u with PCP in 1-2 weeks)        The results of significant diagnostics from this hospitalization (including imaging, microbiology, ancillary and laboratory) are listed below for reference.    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  He has a history of prior vena thromboembolism with a successful bilateral lower  extremity lysis procedure done in May 2013.  He has been on Lovenox therapy which has been successful for until recently when he was incarcerated and missed 5 days and is therapeutic regimen.  He began experiencing progressive left lower extremity swelling shortly thereafter.  He now presents and extremity with massive left lower extremity swelling and extensive superficial and deep venous thrombus.  Procedures Performed: 1. Ultrasound-guided puncture of the left popliteal vein 2.  Dilated left lower extremity venogram 3.  Successful catheterization of the inferior vena cava 4.  Inferior vena cava gram 5.  Balloon occluded reflux of TPA into the proximal calf veins 6.  Placement of multi side-hole infusion catheter initiation of venous thrombolysis  Interventional Radiologist:  Sterling Big, MD  Sedation: Moderate (conscious) sedation was used.  Six mg Versed, 200 mcg Fentanyl were administered intravenously.  The patient's vital signs were monitored continuously by radiology nursing throughout the procedure.  Sedation Time: 15 minutes  Fluoroscopy time: 16 minutes  Contrast volume: 40 ml Omnipaque-300  Additional medications:  4 mg TPA administered intravenously  PROCEDURE/FINDINGS:   Informed consent was obtained from the patient following explanation of the procedure, risks, benefits and alternatives. The patient understands, agrees and consents for the procedure. All questions were addressed. A time out was performed.  Maximal barrier sterile technique utilized including caps, mask, sterile gowns, sterile gloves, large sterile drape, hand hygiene, and betadine skin prep.  The left popliteal fossa was interrogated with ultrasound.  The popliteal vein is thrombosed.  Local anesthesia was attained by infiltration of 1% lidocaine.  Under direct sonographic guidance, the vessels punctured with a 21-gauge micropuncture needle.  An image was obtained and stored for the medical record.  A Benson wire was advanced  into the right femoral vein and a 7- [place] vascular sheath advanced over the wire.  Through the sheath, a limited left lower extremity venogram was performed.  There is extensive thrombus throughout the popliteal and femoral vein. Using a vertebral catheter and glide wire, the catheter was successfully navigated the common femoral vein.  A venogram demonstrates complete occlusion of the external and common iliac veins.  There is an extensive network of collateral vessels. Thrombus is also seen extending into the great saphenous vein. After an extensive manipulation, the catheter was finally navigated into the inferior vena cava.  An inferior vena cava gram confirms patency of the cava and IVC filter.  A wire was advanced into the inferior vena cava.  The sheath was pulled back to near the insertion point in the popliteal vein.  A 6 mm balloon was advanced into the popliteal vein above the knee and inflated.  A gentle hand injection of contrast material confirmed reflux into the proximal calf veins which are thrombosed.  A total of 4 mg of TPA was then slowly injected and refluxed into the proximal calf veins.  The sheath was re-advanced into the more distal popliteal vein.  A 90 cm length, 50 cm infusion Unifuse infusion catheter was then advanced over the wire and positioned so that it covered the common, external, common femoral and the majority of the femoral veins.  The sheath and catheter was secured with a post silk suture and a large sterile bandage.  Thrombolysis was initiated at 0.5 mg TNK- tPA per hour.  IMPRESSION:  1.  Left lower extremity venogram demonstrates complete occlusion of the left common and external iliac veins with extensive thrombus from the common femoral vein into the proximal calf veins as well as within the great saphenous vein.  2.  Reflux of 4 mg of TPA into the proximal calf veins.  3.  Placement of a multi side-hole infusion catheter from the common iliac vein into the distal  femoral vein.  4.  Initiation of venous thrombolysis.  The patient will return to interventional radiology in 12 - 18 hours for venogram and lysis check.  I expect the patient will acquire angioplasty, and likely stenting of the left common and external iliac veins.  Signed,  Sterling Big, MD [facility] Radiology   Original Report Authenticated By: Malachy Moan, M.D.   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  He has a history of prior vena thromboembolism with a successful  bilateral lower extremity lysis procedure done in May 2013.  He has been on Lovenox therapy which has been successful for until recently when he was incarcerated and missed 5 days and is therapeutic regimen.  He began experiencing progressive left lower extremity swelling shortly thereafter.  He now presents and extremity with massive left lower extremity swelling and extensive superficial and deep venous thrombus.  Procedures Performed: 1. Ultrasound-guided puncture of the left popliteal vein 2.  Dilated left lower extremity venogram 3.  Successful catheterization of the inferior vena cava 4.  Inferior vena cava gram 5.  Balloon occluded reflux of TPA into the proximal calf veins 6.  Placement of multi side-hole infusion catheter initiation of venous thrombolysis  Interventional Radiologist:  Sterling Big, MD  Sedation: Moderate (conscious) sedation was used.  Six mg Versed, 200 mcg Fentanyl were administered intravenously.  The patient's vital signs were monitored continuously by radiology nursing throughout the procedure.  Sedation Time: 15 minutes  Fluoroscopy time: 16 minutes  Contrast volume: 40 ml Omnipaque-300  Additional medications:  4 mg  TPA administered intravenously  PROCEDURE/FINDINGS:   Informed consent was obtained from the patient following explanation of the procedure, risks, benefits and alternatives. The patient understands, agrees and consents for the procedure. All questions were addressed. A time out was performed.  Maximal barrier sterile technique utilized including caps, mask, sterile gowns, sterile gloves, large sterile drape, hand hygiene, and betadine skin prep.  The left popliteal fossa was interrogated with ultrasound.  The popliteal vein is thrombosed.  Local anesthesia was attained by infiltration of 1% lidocaine.  Under direct sonographic guidance, the vessels punctured with a 21-gauge micropuncture needle.  An image was obtained and stored for the medical record.  A Benson wire was advanced into the right femoral vein and a 7- [place] vascular sheath advanced over the wire.  Through the sheath, a limited left lower extremity venogram was performed.  There is extensive thrombus throughout the popliteal and femoral vein. Using a vertebral catheter and glide wire, the catheter was successfully navigated the common femoral vein.  A venogram demonstrates complete occlusion of the external and common iliac veins.  There is an extensive network of collateral vessels. Thrombus is also seen extending into the great saphenous vein. After an extensive manipulation, the catheter was finally navigated into the inferior vena cava.  An inferior vena cava gram confirms patency of the cava and IVC filter.  A wire was advanced into the inferior vena cava.  The sheath was pulled back to near the insertion point in the popliteal vein.  A 6 mm balloon was advanced into the popliteal vein above the knee and inflated.  A gentle hand injection of contrast material confirmed reflux into the proximal calf veins which are thrombosed.  A total of 4 mg of TPA was then slowly injected and refluxed into the proximal calf veins.  The sheath was re-advanced  into the more distal popliteal vein.  A 90 cm length, 50 cm infusion Unifuse infusion catheter was then advanced over the wire and positioned so that it covered the common, external, common femoral and the majority of the femoral veins.  The sheath and catheter was secured with a post silk suture and a large sterile bandage.  Thrombolysis was initiated at 0.5 mg TNK- tPA per hour.  IMPRESSION:  1.  Left lower extremity venogram demonstrates complete occlusion of the left common and external iliac veins with extensive thrombus from the common femoral vein into the proximal  calf veins as well as within the great saphenous vein.  2.  Reflux of 4 mg of TPA into the proximal calf veins.  3.  Placement of a multi side-hole infusion catheter from the common iliac vein into the distal femoral vein.  4.  Initiation of venous thrombolysis.  The patient will return to interventional radiology in 12 - 18 hours for venogram and lysis check.  I expect the patient will acquire angioplasty, and likely stenting of the left common and external iliac veins.  Signed,  Sterling Big, MD [facility] Radiology   Original Report Authenticated By: Malachy Moan, M.D.   Ir Pta Venous Left  03/27/2013   *RADIOLOGY REPORT*  Clinical Data: Extensive left lower extremity iliofemoral DVT and status post initiation of transcatheter thrombolytic therapy yesterday.  Tenecteplase infusion had to be discontinued earlier today due to precipitous drop in fibrinogen to dangerously low levels after overnight thrombolytic therapy.  1.  FOLLOW-UP VENOGRAPHY DURING VENOUS THROMBOLYSIS, FINAL DAY. 2.  MECHANICAL THROMBECTOMY OF THE FEMORAL AND ILIAC VEINS. 3.  BALLOON ANGIOPLASTY OF FEMORAL AND ILIAC VEINS.  Comparison:  Imaging on 03/26/2013.  Sedation: 3.0 mg IV Versed; 150 mcg IV Fentanyl.  Total Moderate Sedation Time: 50 minutes.  Contrast:  65 ml Omnipaque-300  Fluoroscopy Time: 7 minutes and 12  seconds.  Procedure:  The procedure, risks, benefits, and alternatives were explained to the patient.  Questions regarding the procedure were encouraged and answered.  The patient understands and consents to the procedure.  The left popliteal fossa sheath and infusion catheter were prepped with Betadine in a sterile fashion, and a sterile drape was applied covering the operative field.  A sterile gown and sterile gloves were used for the procedure.  Venography was initially performed through the infusion catheter followed by a sheath.  Infusion catheter was removed over a guidewire.  Mechanical thrombectomy was performed throughout the femoral and iliac veins with the AngioJet device.  Additional venography was performed through the sheath as well as the AngioJet catheter.  Balloon angioplasty was performed in the thigh at the level of the femoral vein as well as throughout the entire common femoral vein, external iliac vein and common iliac vein with a 7 mm x 4 cm Conquest balloon.  Additional venography was performed.  Additional balloon angioplasty was performed of the common femoral vein, external iliac vein and common iliac vein with a 9 mm x 4 cm Mustang balloon.  Additional venography was performed.  The popliteal vein sheath and guidewire were then removed and hemostasis obtained with manual compression.  Complications: None  Findings: Patency of the native deep veins showed significant improvement compared to pre thrombolytic therapy venography. 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  After AngioJet mechanical thrombectomy, marginal improvement was noted in venous patency.  After balloon angioplasty, further improvement was noted at the level of the femoral vein of the thigh, the common femoral vein and external and common iliac veins. Further  dilatation of the common femoral vein and iliac veins were performed with a larger balloon.  There is persistent diffuse stenosis present of the deep venous system.  However, stent placement was not felt necessary as it would require complete reconstruction of the iliac veins as well as the common femoral vein and likely necessitate crossing the inguinal ligament.  IMPRESSION: Improved patency  of the deep venous system of the left lower extremity including the femoral veins and iliac veins after overnight thrombolytic therapy.  Thrombolytic therapy had to be discontinued due to precipitous drop in fibrinogen level.  Patency of the veins was further improved today after mechanical thrombectomy and extensive venous angioplasty.  Popliteal vein access was removed upon completion of the procedure.   Original Report Authenticated By: Irish Lack, M.D.   Ir Thrombect Veno Mech Mod Sed  03/27/2013   *RADIOLOGY REPORT*  Clinical Data: Extensive left lower extremity iliofemoral DVT and status post initiation of transcatheter thrombolytic therapy yesterday.  Tenecteplase infusion had to be discontinued earlier today due to precipitous drop in fibrinogen to dangerously low levels after overnight thrombolytic therapy.  1.  FOLLOW-UP VENOGRAPHY DURING VENOUS THROMBOLYSIS, FINAL DAY. 2.  MECHANICAL THROMBECTOMY OF THE FEMORAL AND ILIAC VEINS. 3.  BALLOON ANGIOPLASTY OF FEMORAL AND ILIAC VEINS.  Comparison:  Imaging on 03/26/2013.  Sedation: 3.0 mg IV Versed; 150 mcg IV Fentanyl.  Total Moderate Sedation Time: 50 minutes.  Contrast:  65 ml Omnipaque-300  Fluoroscopy Time: 7 minutes and 12 seconds.  Procedure:  The procedure, risks, benefits, and alternatives were explained to the patient.  Questions regarding the procedure were encouraged and answered.  The patient understands and consents to the procedure.  The left popliteal fossa sheath and infusion catheter were prepped with Betadine in a sterile fashion, and a sterile  drape was applied covering the operative field.  A sterile gown and sterile gloves were used for the procedure.  Venography was initially performed through the infusion catheter followed by a sheath.  Infusion catheter was removed over a guidewire.  Mechanical thrombectomy was performed throughout the femoral and iliac veins with the AngioJet device.  Additional venography was performed through the sheath as well as the AngioJet catheter.  Balloon angioplasty was performed in the thigh at the level of the femoral vein as well as throughout the entire common femoral vein, external iliac vein and common iliac vein with a 7 mm x 4 cm Conquest balloon.  Additional venography was performed.  Additional balloon angioplasty was performed of the common femoral vein, external iliac vein and common iliac vein with a 9 mm x 4 cm Mustang balloon.  Additional venography was performed.  The popliteal vein sheath and guidewire were then removed and hemostasis obtained with manual compression.  Complications: None  Findings: Patency of the native deep veins showed significant improvement compared to pre thrombolytic therapy venography. 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  After AngioJet mechanical thrombectomy, marginal improvement was noted in venous patency.  After balloon angioplasty, further improvement was noted at the level of the femoral vein of the thigh, the common femoral vein and external and common iliac veins. Further dilatation of the common femoral vein and iliac veins were performed with a larger balloon.  There is persistent diffuse stenosis present of the deep venous system.  However, stent placement was not felt necessary as it would require complete reconstruction of the iliac veins as well as the common femoral vein and likely necessitate crossing the  inguinal ligament.  IMPRESSION: Improved patency of the deep venous system of the left lower extremity including the femoral veins and iliac veins after overnight thrombolytic therapy.  Thrombolytic therapy had to be discontinued due to precipitous drop in fibrinogen level.  Patency of  the veins was further improved today after mechanical thrombectomy and extensive venous angioplasty.  Popliteal vein access was removed upon completion of the procedure.   Original Report Authenticated By: Irish Lack, M.D.   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  He has a history of prior vena thromboembolism with a successful bilateral lower extremity lysis procedure done in May 2013.  He has been on Lovenox therapy which has been successful for until recently when he was incarcerated and missed 5 days and is therapeutic regimen.  He began experiencing progressive left lower extremity swelling shortly thereafter.  He now presents and extremity with massive left lower extremity swelling and extensive superficial and deep venous thrombus.  Procedures Performed: 1. Ultrasound-guided puncture of the left popliteal vein 2.  Dilated left lower extremity venogram 3.  Successful catheterization of the inferior vena cava 4.  Inferior vena cava gram 5.  Balloon occluded reflux of TPA into the proximal calf veins 6.  Placement of multi side-hole infusion catheter initiation of venous thrombolysis  Interventional Radiologist:  Sterling Big, MD  Sedation: Moderate (conscious) sedation was used.  Six mg Versed, 200 mcg Fentanyl were administered intravenously.  The patient's vital signs were monitored continuously by radiology nursing throughout the procedure.  Sedation Time: 15 minutes   Fluoroscopy time: 16 minutes  Contrast volume: 40 ml Omnipaque-300  Additional medications:  4 mg TPA administered intravenously  PROCEDURE/FINDINGS:   Informed consent was obtained from the patient following explanation of the procedure, risks, benefits and alternatives. The patient understands, agrees and consents for the procedure. All questions were addressed. A time out was performed.  Maximal barrier sterile technique utilized including caps, mask, sterile gowns, sterile gloves, large sterile drape, hand hygiene, and betadine skin prep.  The left popliteal fossa was interrogated with ultrasound.  The popliteal vein is thrombosed.  Local anesthesia was attained by infiltration of 1% lidocaine.  Under direct sonographic guidance, the vessels punctured with a 21-gauge micropuncture needle.  An image was obtained and stored for the medical record.  A Benson wire was advanced into the right femoral vein and a 7- [place] vascular sheath advanced over the wire.  Through the sheath, a limited left lower extremity venogram was performed.  There is extensive thrombus throughout the popliteal and femoral vein. Using a vertebral catheter and glide wire, the catheter was successfully navigated the common femoral vein.  A venogram demonstrates complete occlusion of the external and common iliac veins.  There is an extensive network of collateral vessels. Thrombus is also seen extending into the great saphenous vein. After an extensive manipulation, the catheter was finally navigated into the inferior vena cava.  An inferior vena cava gram confirms patency of the cava and IVC filter.  A wire was advanced into the inferior vena cava.  The sheath was pulled back to near the insertion point in the popliteal vein.  A 6 mm balloon was advanced into the popliteal vein above the knee and inflated.  A gentle hand injection of contrast material confirmed reflux into the proximal calf veins which are thrombosed.  A total of 4 mg of TPA  was then slowly injected and refluxed into the proximal calf veins.  The sheath was re-advanced into the more distal popliteal vein.  A  90 cm length, 50 cm infusion Unifuse infusion catheter was then advanced over the wire and positioned so that it covered the common, external, common femoral and the majority of the femoral veins.  The sheath and catheter was secured with a post silk suture and a large sterile bandage.  Thrombolysis was initiated at 0.5 mg TNK- tPA per hour.  IMPRESSION:  1.  Left lower extremity venogram demonstrates complete occlusion of the left common and external iliac veins with extensive thrombus from the common femoral vein into the proximal calf veins as well as within the great saphenous vein.  2.  Reflux of 4 mg of TPA into the proximal calf veins.  3.  Placement of a multi side-hole infusion catheter from the common iliac vein into the distal femoral vein.  4.  Initiation of venous thrombolysis.  The patient will return to interventional radiology in 12 - 18 hours for venogram and lysis check.  I expect the patient will acquire angioplasty, and likely stenting of the left common and external iliac veins.  Signed,  Sterling Big, MD [facility]iology   Original Report Authenticated By: Malachy Moan, M.D.   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  He has a history of prior vena thromboembolism with a successful bilateral lower extremity lysis procedure done in May 2013.  He has been on Lovenox therapy which has been successful for until recently when he was incarcerated and missed 5 days and is therapeutic regimen.  He began experiencing progressive left lower extremity  swelling shortly thereafter.  He now presents and extremity with massive left lower extremity swelling and extensive superficial and deep venous thrombus.  Procedures Performed: 1. Ultrasound-guided puncture of the left popliteal vein 2.  Dilated left lower extremity venogram 3.  Successful catheterization of the inferior vena cava 4.  Inferior vena cava gram 5.  Balloon occluded reflux of TPA into the proximal calf veins 6.  Placement of multi side-hole infusion catheter initiation of venous thrombolysis  Interventional Radiologist:  Sterling Big, MD  Sedation: Moderate (conscious) sedation was used.  Six mg Versed, 200 mcg Fentanyl were administered intravenously.  The patient's vital signs were monitored continuously by radiology nursing throughout the procedure.  Sedation Time: 15 minutes  Fluoroscopy time: 16 minutes  Contrast volume: 40 ml Omnipaque-300  Additional medications:  4 mg TPA administered intravenously  PROCEDURE/FINDINGS:   Informed consent was obtained from the patient following explanation of the procedure, risks, benefits and alternatives. The patient understands, agrees and consents for the procedure. All questions were addressed. A time out was performed.  Maximal barrier sterile technique utilized including caps, mask, sterile gowns, sterile gloves, large sterile drape, hand hygiene, and betadine skin prep.  The left popliteal fossa was interrogated with ultrasound.  The popliteal vein is thrombosed.  Local anesthesia was attained by infiltration of 1% lidocaine.  Under direct sonographic guidance, the vessels punctured with a 21-gauge micropuncture needle.  An image was obtained and stored for the medical record.  A Benson wire was advanced into the right femoral vein and a 7- [place] vascular sheath advanced over the wire.  Through the sheath, a limited left lower extremity venogram was performed.  There is extensive thrombus throughout the popliteal and  femoral vein. Using a  vertebral catheter and glide wire, the catheter was successfully navigated the common femoral vein.  A venogram demonstrates complete occlusion of the external and common iliac veins.  There is an extensive network of collateral vessels. Thrombus is also seen extending into the great saphenous vein. After an extensive manipulation, the catheter was finally navigated into the inferior vena cava.  An inferior vena cava gram confirms patency of the cava and IVC filter.  A wire was advanced into the inferior vena cava.  The sheath was pulled back to near the insertion point in the popliteal vein.  A 6 mm balloon was advanced into the popliteal vein above the knee and inflated.  A gentle hand injection of contrast material confirmed reflux into the proximal calf veins which are thrombosed.  A total of 4 mg of TPA was then slowly injected and refluxed into the proximal calf veins.  The sheath was re-advanced into the more distal popliteal vein.  A 90 cm length, 50 cm infusion Unifuse infusion catheter was then advanced over the wire and positioned so that it covered the common, external, common femoral and the majority of the femoral veins.  The sheath and catheter was secured with a post silk suture and a large sterile bandage.  Thrombolysis was initiated at 0.5 mg TNK- tPA per hour.  IMPRESSION:  1.  Left lower extremity venogram demonstrates complete occlusion of the left common and external iliac veins with extensive thrombus from the common femoral vein into the proximal calf veins as well as within the great saphenous vein.  2.  Reflux of 4 mg of TPA into the proximal calf veins.  3.  Placement of a multi side-hole infusion catheter from the common iliac vein into the distal femoral vein.  4.  Initiation of venous thrombolysis.  The patient will return to interventional radiology in 12 - 18 hours for venogram and lysis check.  I expect the patient will acquire angioplasty, and likely stenting of the left common and  external iliac veins.  Signed,  Sterling Big, MD [facility] Radiology   Original Report Authenticated By: Malachy Moan, M.D.   Ir Rande Lawman F/u Eval Art/ven Final Day (ms)  03/27/2013   *RADIOLOGY REPORT*  Clinical Data: Extensive left lower extremity iliofemoral DVT and status post initiation of transcatheter thrombolytic therapy yesterday.  Tenecteplase infusion had to be discontinued earlier today due to precipitous drop in fibrinogen to dangerously low levels after overnight thrombolytic therapy.  1.  FOLLOW-UP VENOGRAPHY DURING VENOUS THROMBOLYSIS, FINAL DAY. 2.  MECHANICAL THROMBECTOMY OF THE FEMORAL AND ILIAC VEINS. 3.  BALLOON ANGIOPLASTY OF FEMORAL AND ILIAC VEINS.  Comparison:  Imaging on 03/26/2013.  Sedation: 3.0 mg IV Versed; 150 mcg IV Fentanyl.  Total Moderate Sedation Time: 50 minutes.  Contrast:  65 ml Omnipaque-300  Fluoroscopy Time: 7 minutes and 12 seconds.  Procedure:  The procedure, risks, benefits, and alternatives were explained to the patient.  Questions regarding the procedure were encouraged and answered.  The patient understands and consents to the procedure.  The left popliteal fossa sheath and infusion catheter were prepped with Betadine in a sterile fashion, and a sterile drape was applied covering the operative field.  A sterile gown and sterile gloves were used for the procedure.  Venography was initially performed through the infusion catheter followed by a sheath.  Infusion catheter was removed over a guidewire.  Mechanical thrombectomy was performed throughout the femoral and iliac veins  with the AngioJet device.  Additional venography was performed through the sheath as well as the AngioJet catheter.  Balloon angioplasty was performed in the thigh at the level of the femoral vein as well as throughout the entire common femoral vein, external iliac vein and common iliac vein with a 7 mm x 4 cm Conquest balloon.  Additional  venography was performed.  Additional balloon angioplasty was performed of the common femoral vein, external iliac vein and common iliac vein with a 9 mm x 4 cm Mustang balloon.  Additional venography was performed.  The popliteal vein sheath and guidewire were then removed and hemostasis obtained with manual compression.  Complications: None  Findings: Patency of the native deep veins showed significant improvement compared to pre thrombolytic therapy venography. 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  After AngioJet mechanical thrombectomy, marginal improvement was noted in venous patency.  After balloon angioplasty, further improvement was noted at the level of the femoral vein of the thigh, the common femoral vein and external and common iliac veins. Further dilatation of the common femoral vein and iliac veins were performed with a larger balloon.  There is persistent diffuse stenosis present of the deep venous system.  However, stent placement was not felt necessary as it would require complete reconstruction of the iliac veins as well as the common femoral vein and likely necessitate crossing the inguinal ligament.  IMPRESSION: Improved patency of the deep venous system of the left lower extremity including the femoral veins and iliac veins after overnight thrombolytic therapy.  Thrombolytic therapy had to be discontinued due to precipitous drop in fibrinogen level.  Patency of the veins was further improved today after mechanical thrombectomy and extensive venous angioplasty.  Popliteal vein access was removed upon completion of the procedure.   Original Report Authenticated By: Irish Lack, M.D.    Microbiology: No results found for this or any previous visit (from the past 240 hour(s)).   Labs: Basic Metabolic Panel:  Recent Labs Lab  04/21/13 0809 04/22/13 0540 04/23/13 0536 04/24/13 0448  NA 137 139 138 140  K 4.5 3.7 3.9 3.8  CL  --  107 105 106  CO2 20* 24 24 27   GLUCOSE 98 89 89 90  BUN 14.7 13 8 8   CREATININE 0.9 0.64 0.59 0.66  CALCIUM 10.0 8.7 9.0 9.4   Liver Function Tests:  Recent Labs Lab 04/21/13 0809  AST 25  ALT 24  ALKPHOS 79  BILITOT 0.52  PROT 8.4*  ALBUMIN 4.1   No results found for this basename: LIPASE, AMYLASE,  in the last 168 hours No results found for this basename: AMMONIA,  in the last 168 hours CBC:  Recent Labs Lab 04/21/13 0809 04/22/13 0540 04/23/13 0536  WBC 9.5 6.5 5.3  NEUTROABS 6.9*  --   --   HGB 12.2* 11.2* 11.7*  HCT 37.1* 34.2* 35.8*  MCV 96.1 95.8 96.8  PLT 292 236 234   Cardiac Enzymes: No results found for this basename: CKTOTAL, CKMB, CKMBINDEX, TROPONINI,  in the last 168 hours BNP: BNP (last 3 results) No results found for this basename: PROBNP,  in the last 8760 hours CBG: No results found for this basename: GLUCAP,  in the last 168 hours     Signed:  THOMPSON,DANIEL  [facility] 04/25/2013, 11:28 AM

## 2013-04-25 NOTE — Progress Notes (Signed)
CSW received a call back from [facility]. They state that patient was only there for a 2 week letter of guarantee and that patient has no disposition due to being homeless. CSW asked if they would consider taking patient back for two more weeks. Admissions coordinator stated no because patient has no place to go afterwards. CSW communicated same to MD. Patient has no other facility offers. [place]  Kyaira Trantham C. Zamaria Brazzle MSW, LCSW [phone]

## 2013-05-16 ENCOUNTER — Telehealth: Payer: Self-pay | Admitting: Hematology and Oncology

## 2013-06-12 ENCOUNTER — Encounter (HOSPITAL_COMMUNITY): Payer: Self-pay

## 2013-06-12 ENCOUNTER — Inpatient Hospital Stay (HOSPITAL_COMMUNITY)
Admission: EM | Admit: 2013-06-12 | Discharge: 2013-06-14 | DRG: 300 | Disposition: A | Discharge status: Home / Self Care | Payer: Medicaid Other | Attending: Internal Medicine | Admitting: Internal Medicine

## 2013-06-12 DIAGNOSIS — K219 Gastro-esophageal reflux disease without esophagitis: Secondary | ICD-10-CM | POA: Diagnosis present

## 2013-06-12 DIAGNOSIS — F10239 Alcohol dependence with withdrawal, unspecified: Secondary | ICD-10-CM

## 2013-06-12 DIAGNOSIS — D649 Anemia, unspecified: Secondary | ICD-10-CM

## 2013-06-12 DIAGNOSIS — R319 Hematuria, unspecified: Secondary | ICD-10-CM

## 2013-06-12 DIAGNOSIS — Z86718 Personal history of other venous thrombosis and embolism: Secondary | ICD-10-CM

## 2013-06-12 DIAGNOSIS — M479 Spondylosis, unspecified: Secondary | ICD-10-CM | POA: Diagnosis present

## 2013-06-12 DIAGNOSIS — F17200 Nicotine dependence, unspecified, uncomplicated: Secondary | ICD-10-CM | POA: Diagnosis present

## 2013-06-12 DIAGNOSIS — F10939 Alcohol use, unspecified with withdrawal, unspecified: Secondary | ICD-10-CM

## 2013-06-12 DIAGNOSIS — F1010 Alcohol abuse, uncomplicated: Secondary | ICD-10-CM

## 2013-06-12 DIAGNOSIS — E876 Hypokalemia: Secondary | ICD-10-CM

## 2013-06-12 DIAGNOSIS — R791 Abnormal coagulation profile: Secondary | ICD-10-CM

## 2013-06-12 DIAGNOSIS — I82401 Acute embolism and thrombosis of unspecified deep veins of right lower extremity: Secondary | ICD-10-CM

## 2013-06-12 DIAGNOSIS — Z5900 Homelessness unspecified: Secondary | ICD-10-CM

## 2013-06-12 DIAGNOSIS — Z7901 Long term (current) use of anticoagulants: Secondary | ICD-10-CM

## 2013-06-12 DIAGNOSIS — Z79899 Other long term (current) drug therapy: Secondary | ICD-10-CM

## 2013-06-12 DIAGNOSIS — Z9114 Patient's other noncompliance with medication regimen: Secondary | ICD-10-CM

## 2013-06-12 DIAGNOSIS — D638 Anemia in other chronic diseases classified elsewhere: Secondary | ICD-10-CM | POA: Diagnosis present

## 2013-06-12 DIAGNOSIS — I82403 Acute embolism and thrombosis of unspecified deep veins of lower extremity, bilateral: Secondary | ICD-10-CM

## 2013-06-12 DIAGNOSIS — I824Y2 Acute embolism and thrombosis of unspecified deep veins of left proximal lower extremity: Secondary | ICD-10-CM

## 2013-06-12 DIAGNOSIS — R109 Unspecified abdominal pain: Secondary | ICD-10-CM

## 2013-06-12 DIAGNOSIS — I82629 Acute embolism and thrombosis of deep veins of unspecified upper extremity: Secondary | ICD-10-CM | POA: Diagnosis present

## 2013-06-12 DIAGNOSIS — Z9119 Patient's noncompliance with other medical treatment and regimen: Secondary | ICD-10-CM

## 2013-06-12 DIAGNOSIS — Z7982 Long term (current) use of aspirin: Secondary | ICD-10-CM

## 2013-06-12 DIAGNOSIS — I82409 Acute embolism and thrombosis of unspecified deep veins of unspecified lower extremity: Secondary | ICD-10-CM

## 2013-06-12 DIAGNOSIS — Z23 Encounter for immunization: Secondary | ICD-10-CM

## 2013-06-12 DIAGNOSIS — I87009 Postthrombotic syndrome without complications of unspecified extremity: Secondary | ICD-10-CM

## 2013-06-12 DIAGNOSIS — I2782 Chronic pulmonary embolism: Secondary | ICD-10-CM | POA: Diagnosis present

## 2013-06-12 DIAGNOSIS — Z91199 Patient's noncompliance with other medical treatment and regimen due to unspecified reason: Secondary | ICD-10-CM

## 2013-06-12 DIAGNOSIS — I82402 Acute embolism and thrombosis of unspecified deep veins of left lower extremity: Secondary | ICD-10-CM

## 2013-06-12 DIAGNOSIS — Z91148 Patient's other noncompliance with medication regimen for other reason: Secondary | ICD-10-CM

## 2013-06-12 DIAGNOSIS — D6859 Other primary thrombophilia: Secondary | ICD-10-CM | POA: Diagnosis present

## 2013-06-12 DIAGNOSIS — Z720 Tobacco use: Secondary | ICD-10-CM

## 2013-06-12 DIAGNOSIS — I824Y9 Acute embolism and thrombosis of unspecified deep veins of unspecified proximal lower extremity: Secondary | ICD-10-CM

## 2013-06-12 DIAGNOSIS — F332 Major depressive disorder, recurrent severe without psychotic features: Secondary | ICD-10-CM

## 2013-06-12 DIAGNOSIS — I2510 Atherosclerotic heart disease of native coronary artery without angina pectoris: Secondary | ICD-10-CM | POA: Diagnosis present

## 2013-06-12 DIAGNOSIS — I82611 Acute embolism and thrombosis of superficial veins of right upper extremity: Secondary | ICD-10-CM

## 2013-06-12 DIAGNOSIS — D6862 Lupus anticoagulant syndrome: Secondary | ICD-10-CM

## 2013-06-12 DIAGNOSIS — Z8672 Personal history of thrombophlebitis: Secondary | ICD-10-CM

## 2013-06-12 DIAGNOSIS — L03116 Cellulitis of left lower limb: Secondary | ICD-10-CM

## 2013-06-12 DIAGNOSIS — E871 Hypo-osmolality and hyponatremia: Secondary | ICD-10-CM

## 2013-06-12 DIAGNOSIS — M1990 Unspecified osteoarthritis, unspecified site: Secondary | ICD-10-CM

## 2013-06-12 DIAGNOSIS — I824Z9 Acute embolism and thrombosis of unspecified deep veins of unspecified distal lower extremity: Principal | ICD-10-CM | POA: Diagnosis present

## 2013-06-12 DIAGNOSIS — F1020 Alcohol dependence, uncomplicated: Secondary | ICD-10-CM | POA: Diagnosis present

## 2013-06-12 LAB — BASIC METABOLIC PANEL
BUN: 3 mg/dL — ABNORMAL LOW (ref 6–23)
Chloride: 101 mEq/L (ref 96–112)
GFR calc Af Amer: >90 mL/min (ref >90)
GFR calc non Af Amer: >90 mL/min (ref >90)
Glucose, Bld: 73 mg/dL (ref 70–99)
Potassium: 4 mEq/L (ref 3.5–5.1)
Sodium: 137 mEq/L (ref 135–145)

## 2013-06-12 LAB — CBC WITH DIFFERENTIAL/PLATELET
Basophils Relative: 1 % (ref 0–1)
Eosinophils Absolute: 0.1 10*3/uL (ref 0.0–0.7)
Hemoglobin: 12.5 g/dL — ABNORMAL LOW (ref 13.0–17.0)
Lymphs Abs: 1.6 10*3/uL (ref 0.7–4.0)
MCH: 32.5 pg (ref 26.0–34.0)
Monocytes Relative: 11 % (ref 3–12)
Neutro Abs: 4.4 10*3/uL (ref 1.7–7.7)
Neutrophils Relative %: 64 % (ref 43–77)
Platelets: 194 10*3/uL (ref 150–400)
RBC: 3.85 MIL/uL — ABNORMAL LOW (ref 4.22–5.81)
WBC: 6.8 10*3/uL (ref 4.0–10.5)

## 2013-06-12 LAB — PROTIME-INR: Prothrombin Time: 12.3 seconds (ref 11.6–15.2)

## 2013-06-12 LAB — HEPATIC FUNCTION PANEL
Albumin: 2.7 g/dL — ABNORMAL LOW (ref 3.5–5.2)
Alkaline Phosphatase: 91 U/L (ref 39–117)
Bilirubin, Direct: <0.1 mg/dL (ref 0.0–0.3)
Total Bilirubin: 0.2 mg/dL — ABNORMAL LOW (ref 0.3–1.2)

## 2013-06-12 LAB — APTT: aPTT: 31 seconds (ref 24–37)

## 2013-06-12 MED ORDER — THIAMINE HCL 100 MG/ML IJ SOLN
100.0000 mg | Freq: Every day | INTRAMUSCULAR | Status: DC
  Filled 2013-06-12 (×3): qty 1

## 2013-06-12 MED ORDER — LORAZEPAM 1 MG PO TABS
1.0000 mg | ORAL_TABLET | Freq: Four times a day (QID) | ORAL | Status: DC | PRN
  Administered 2013-06-13: 19:00:00 1 mg via ORAL
  Filled 2013-06-12: qty 1

## 2013-06-12 MED ORDER — HEPARIN BOLUS VIA INFUSION
4500.0000 [IU] | Freq: Once | INTRAVENOUS | Status: AC
  Administered 2013-06-12: 4500 [IU] via INTRAVENOUS
  Filled 2013-06-12: qty 4500

## 2013-06-12 MED ORDER — NICOTINE 21 MG/24HR TD PT24
42.0000 mg | MEDICATED_PATCH | Freq: Every day | TRANSDERMAL | Status: DC
  Administered 2013-06-12 – 2013-06-13 (×2): 42 mg via TRANSDERMAL
  Filled 2013-06-12 (×4): qty 2

## 2013-06-12 MED ORDER — FOLIC ACID 1 MG PO TABS: 1.0000 mg | ORAL_TABLET | Freq: Every day | ORAL | Status: DC

## 2013-06-12 MED ORDER — INFLUENZA VAC SPLIT QUAD 0.5 ML IM SUSP: 0.5000 mL | INTRAMUSCULAR | Status: DC | PRN

## 2013-06-12 MED ORDER — VITAMIN B-1 100 MG PO TABS
100.0000 mg | ORAL_TABLET | Freq: Every day | ORAL | Status: DC
  Administered 2013-06-12 – 2013-06-14 (×3): 100 mg via ORAL
  Filled 2013-06-12 (×3): qty 1

## 2013-06-12 MED ORDER — HEPARIN (PORCINE) IN NACL 100-0.45 UNIT/ML-% IJ SOLN
1450.0000 [IU]/h | INTRAMUSCULAR | Status: DC
  Administered 2013-06-12: 1450 [IU]/h via INTRAVENOUS
  Filled 2013-06-12 (×2): qty 250

## 2013-06-12 MED ORDER — ADULT MULTIVITAMIN W/MINERALS CH
1.0000 | ORAL_TABLET | Freq: Every day | ORAL | Status: DC
  Administered 2013-06-13 – 2013-06-14 (×2): 1 via ORAL
  Filled 2013-06-12 (×3): qty 1

## 2013-06-12 MED ORDER — LORAZEPAM 2 MG/ML IJ SOLN
1.0000 mg | Freq: Four times a day (QID) | INTRAMUSCULAR | Status: DC | PRN
  Administered 2013-06-12: 1 mg via INTRAVENOUS
  Filled 2013-06-12: qty 1

## 2013-06-12 MED ORDER — FOLIC ACID 1 MG PO TABS
1.0000 mg | ORAL_TABLET | Freq: Every day | ORAL | Status: DC
  Administered 2013-06-12 – 2013-06-14 (×3): 1 mg via ORAL
  Filled 2013-06-12 (×3): qty 1

## 2013-06-12 MED ORDER — TRAZODONE HCL 50 MG PO TABS
50.0000 mg | ORAL_TABLET | Freq: Every evening | ORAL | Status: DC | PRN
  Administered 2013-06-12 – 2013-06-13 (×2): 50 mg via ORAL
  Filled 2013-06-12 (×2): qty 1

## 2013-06-12 NOTE — Progress Notes (Signed)
VASCULAR LAB PRELIMINARY  PRELIMINARY  PRELIMINARY  PRELIMINARY  Left lower extremity venous duplex and right upper extremity venous duplex completed.    Preliminary report:    1.  Left lower extremity:  DVT noted from the CFV through the calf veins.  This thrombus appears unchanged since previous studies.  There appears to be acute DVT in the gastrocnemius veins.  2.  Right upper extremity:  Superficial thrombosis noted in the basilic vein from the axilla to the mid forearm.  Close to but not in the axillary vein and brachial vein.     Ekaterini Capitano, RVT 06/12/2013, 4:51 PM

## 2013-06-12 NOTE — ED Notes (Signed)
Ultrasound at bedside

## 2013-06-12 NOTE — ED Provider Notes (Signed)
CSN: [id number]     Arrival date & time 06/12/13  1126 History   First MD Initiated Contact with Patient 06/12/13 1452     Chief Complaint  Patient presents with  . Arm Swelling   (Consider location/radiation/quality/duration/timing/severity/associated sxs/prior Treatment) The history is provided by the patient.   patient presents with swelling in his left lower leg. He states he has DVTs in this area. He also some pain in his right upper extremity near the elbow. He states it feels like he has a blood clot there too. He is supposed to be on Xarelto but he has been off of it for 2 weeks. He has some chronic shortness of breath is unchanged. No fevers. He has chronic cough. [facility].  Past Medical History  Diagnosis Date  . Coronary artery disease   . Degenerative joint disease of spine   . Pulmonary embolism 06/2010  . Degenerative joint disease   . 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 ETOH abuse   . DVT (deep venous thrombosis)   . Hepatitis B   . Mental disorder   . Depression    Past Surgical History  Procedure Laterality Date  . Left elbow surgery    . Tonsillectomy     Family History  Problem Relation Age of Onset  . Cancer Mother     Ovarian   History  Substance Use Topics  . Smoking status: Current Every Day Smoker -- 2.00 packs/day for 30 years    Types: Cigarettes  . Smokeless tobacco: Never Used  . Alcohol Use: Yes     Comment: Drinks a 6-pack of beer daily.    Review of Systems  Constitutional: Negative for activity change and appetite change.  HENT: Negative for neck stiffness.   Eyes: Negative for pain.  Respiratory: Positive for cough and shortness of breath. Negative for chest tightness.   Cardiovascular: Positive for leg swelling. Negative for chest pain.  Gastrointestinal: Negative for nausea, vomiting, abdominal pain and diarrhea.  Genitourinary: Negative for flank pain.  Musculoskeletal:  Negative for back pain.  Skin: Negative for rash.  Neurological: Negative for weakness, numbness and headaches.  Psychiatric/Behavioral: Negative for behavioral problems.    Allergies  Review of patient's allergies indicates no known allergies.  Home Medications   Current Outpatient Rx  Name  Route  Sig  Dispense  Refill  . Aspirin-Salicylamide-Caffeine (BC HEADACHE PO)   Oral   Take 1 each by mouth daily as needed (pain).         . folic acid (FOLVITE) 1 MG tablet   Oral   Take 1 mg by mouth daily as needed.         . Rivaroxaban (XARELTO) 20 MG TABS tablet   Oral   Take 20 mg by mouth daily.         . traZODone (DESYREL) 50 MG tablet   Oral   Take 1 tablet (50 mg total) by mouth at bedtime as needed for sleep.   30 tablet   0    BP 128/73  Pulse 92  Temp(Src) 98.1 F (36.7 C) (Oral)  Resp 16  SpO2 95% Physical Exam  Nursing note and vitals reviewed. Constitutional: He is oriented to person, place, and time. He appears well-developed and well-nourished.  HENT:  Head: Normocephalic and atraumatic.  Eyes: EOM are normal. Pupils are equal, round, and reactive to light.  Neck: Normal range of motion. Neck supple.  Cardiovascular: Normal rate,  regular rhythm and normal heart sounds.   No murmur heard. Pulmonary/Chest: Effort normal and breath sounds normal.  Mildly harsh diffuse breath sounds.  Abdominal: Soft. Bowel sounds are normal. He exhibits no distension and no mass. There is no tenderness. There is no rebound and no guarding.  Musculoskeletal: Normal range of motion. He exhibits tenderness. He exhibits no edema.  Edema to left lower extremity. Some chronic venous changes. Patient states does not look like when he had cellulitis. There is a cord also over the medial aspect of the right elbow. Neurovascular intact distally. Strong peripheral pulses.  Neurological: He is alert and oriented to person, place, and time. No cranial nerve deficit.  Skin: Skin is  warm and dry.  Psychiatric: He has a normal mood and affect.    ED Course  Procedures (including critical care time) Labs Review Labs Reviewed  CBC WITH DIFFERENTIAL - Abnormal; Notable for the following:    RBC 3.85 (*)    Hemoglobin 12.5 (*)    HCT 37.6 (*)    RDW 17.3 (*)    All other components within normal limits  BASIC METABOLIC PANEL - Abnormal; Notable for the following:    BUN 3 (*)    Calcium 8.2 (*)    All other components within normal limits  HEPATIC FUNCTION PANEL - Abnormal; Notable for the following:    Albumin 2.7 (*)    AST 42 (*)    Total Bilirubin 0.2 (*)    All other components within normal limits  PROTIME-INR  APTT   Imaging Review No results found.  MDM   1. DVT (deep venous thrombosis), left   2. Superficial venous thrombosis of arm, right   3. Noncompliance with medications    Patient with likely new DVT in his left calf and SVT nearing DVT in his right upper extremity. His been noncompliant with his anticoagulation. Discussed with oncology. They recommended IV heparin and monitoring for 24-48 hours. Will also need start of anticoagulation. Will be admitted to internal medicine.    Juliet Rude. Rubin Payor, MD 06/12/13 1755

## 2013-06-12 NOTE — Progress Notes (Signed)
Utilization Review completed.  Tama Grosz RN CM  

## 2013-06-12 NOTE — Progress Notes (Deleted)
[facility] History and Physical  [name] ZOX:[id number] DOB: 19-Sep-1965 DOA: 06/12/2013  Referring physician: Rubin Payor, MD PCP: Default, Provider, MD  Specialists: none currently  Chief Complaint: Acute DVT  HPI: [name] is a 47 y.o. male, known Lupus AC +, chronic ETOH/TObacco h/o First DVT with thrombectomy of Femoral and Iliac veins in 04/2013 presented to [facility] long ED 06/12/2013 with swelling in his left lower extremity. He also said to swelling in his right upper extremity. He is noncompliant on his arrival having taken that about 2 weeks ago last time. He is homeless and lives in a tent and during the winter towards himself with the heater. He panhandles and gets money for alcohol and drinks about one case of 24 bottles a day vs. a fifth of liquor when he can get it. His last dose of Xarelto was about 2-1/2 weeks ago. He states that he's been more short of breath than usual as he can usually walk more the 50-75 but as of 4 days ago he has been having more windedness with this. He denies any overt chest pain but states that he has some reflux issues as he eats spicy food Cage questions are 2/4   Review of Systems: The patient denies blurred vision double vision falls weakness rash fever or sputum diarrhea nausea constipation dysphagia dysuria abdominal pain  Past Medical History  Diagnosis Date  . Coronary artery disease   . Degenerative joint disease of spine   . Pulmonary embolism 06/2010  . Degenerative joint disease   . 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 ETOH abuse   . DVT (deep venous thrombosis)   . Hepatitis B   . Mental disorder   . Depression    Past Surgical History  Procedure Laterality Date  . Left elbow surgery    . Tonsillectomy     Social History:  reports that he has been smoking Cigarettes.  He has a 60 pack-year smoking history. He has never used smokeless tobacco. He reports that  drinks alcohol. He reports that he does not use illicit  drugs. 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 Cancer Mother     Ovarian     Prior to Admission medications   Medication Sig Start Date End Date Taking? Authorizing Provider  Aspirin-Salicylamide-Caffeine (BC HEADACHE PO) Take 1 each by mouth daily as needed (pain).   Yes Historical Provider, MD  folic acid (FOLVITE) 1 MG tablet Take 1 mg by mouth daily as needed. 04/03/13  Yes Kathlen Mody, MD  Rivaroxaban (XARELTO) 20 MG TABS tablet Take 20 mg by mouth daily. 04/25/13  Yes Rodolph Bong, MD  traZODone (DESYREL) 50 MG tablet Take 1 tablet (50 mg total) by mouth at bedtime as needed for sleep. 04/25/13  Yes Rodolph Bong, MD   Physical Exam: Filed Vitals:   06/12/13 1558  BP: 125/74  Pulse: 95  Temp:   Resp: 16     General:  Disheveled obese  Caucasian male There is no weight on file to calculate BMI.  Eyes: EOMI, no pallor no icterus  ENT: Soft supple no JVD no thyromegaly  Neck: C. above-in addition poor dentition  Cardiovascular: S1-S2 no murmur rub or gallop  Respiratory: Clinically clear no added sound  Abdomen: Soft nontender no rebound no guarding bowel sounds normal  Skin: Tattoo on left thigh both knees-erythema to right inner aspect of arm and forearm with some redness and some tenderness  Musculoskeletal: Range of motion intact  Psychiatric: Flat affect  Neurologic: Grossly intact moving all 4 limbs equally  Labs on Admission:   Basic Metabolic Panel:  Recent Labs Lab 06/12/13 1510  NA 137  K 4.0  CL 101  CO2 22  GLUCOSE 73  BUN 3*  CREATININE 0.51  CALCIUM 8.2*   Liver Function Tests:  Recent Labs Lab 06/12/13 1550  AST 42*  ALT 24  ALKPHOS 91  BILITOT 0.2*  PROT 6.5  ALBUMIN 2.7*   No results found for this basename: LIPASE, AMYLASE,  in the last 168 hours No results found for this basename: AMMONIA,  in the last 168 hours CBC:  Recent Labs Lab 06/12/13 1510  WBC 6.8  NEUTROABS 4.4  HGB 12.5*  HCT 37.6*  MCV 97.7  PLT 194   Cardiac Enzymes: No results found for this basename: CKTOTAL, CKMB, CKMBINDEX, TROPONINI,  in the last 168 hours  BNP (last 3 results) No results found for this basename: PROBNP,  in the last 8760 hours CBG: No results found for this basename: GLUCAP,  in the last 168 hours  Radiological Exams on Admission: No results found.  EKG: Independently reviewed. None performed  Assessment/Plan Principal Problem:   Upper leg DVT (deep venous thromboembolism), acute Active Problems:   Tobacco abuse   Lupus anticoagulant disorder   Alcohol withdrawal   Homeless single person   1. Acute upper extremity DVT-oncology was consulted and recommended 2 days of IV heparin given his symptomatology may be suspicious for DVT of the chest. I will not get a CT scan of chest as this would not change overall management. He will need 2 days of IV heparin and then transitioned at actors are out to [facility] has coupon to get this at pharmacy and just needs prescription] or Coumadin-we will potentially discuss this with oncologist to discuss the case with Dr. Rubin Payor of the ED.  Needs telemetry to monitor for arrhythmias given potential for decompensation 2. History of ethanol use-placed on CIWA protocol 3.  lupus anticoagulant disorder-potentially will also need to be on antithrombotic medication in addition to anticoagulant 4. Tobacco abuse will place on Nicoderm 42 mg 5. Likely  Multifactorial anemia-monitor.  Continue Folic acid 6. Homelessness  Time spent: 55  Mahala Menghini [facility] Pager [phone]  If 7PM-7AM, please contact night-coverage [email or web address] Password [facility] 06/12/2013, 5:36 PM

## 2013-06-12 NOTE — ED Notes (Signed)
Pickering, MD at bedside.  

## 2013-06-12 NOTE — Progress Notes (Signed)
ANTICOAGULATION CONSULT NOTE - Initial Consult  Pharmacy Consult for Heparin Indication: VTE treatment  No Known Allergies  Patient Measurements: Height: 6' 2.02" (188 cm) (Documented 04/21/13) Weight: 196 lb (88.905 kg) (This was per phone from RN on 06/12/13) IBW/kg (Calculated) : 82.24 Heparin Dosing Weight: 88.9kg  Vital Signs: Temp: 98.1 F (36.7 C) (10/06 1214) Temp src: Oral (10/06 1214) BP: 128/73 mmHg (10/06 1738) Pulse Rate: 92 (10/06 1738)  Labs:  Recent Labs  06/12/13 1510 06/12/13 1550  HGB 12.5*  --   HCT 37.6*  --   PLT 194  --   APTT  --  31  LABPROT  --  12.3  INR  --  0.93  CREATININE 0.51  --     Estimated Creatinine Clearance: 131.3 ml/min (by C-G formula based on Cr of 0.51).   Medical History: Past Medical History  Diagnosis Date  . Coronary artery disease   . Degenerative joint disease of spine   . Pulmonary embolism 06/2010  . Degenerative joint disease   . 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 ETOH abuse   . DVT (deep venous thrombosis)   . Hepatitis B   . Mental disorder   . Depression     Medications:  Scheduled:  . nicotine  42 mg Transdermal Daily   Infusions:   PRN:   Assessment: 47 yo M with swelling in leg and arm and noncompliance with anticoagulation (Xarelto) Goal of Therapy:  Heparin level 0.3-0.7 units/ml Monitor platelets by anticoagulation protocol: Yes   Plan:   Give 4500 units bolus x 1  Start heparin infusion at 1450 units/hr  Continue to monitor H&H and platelets  Heparin level 6 hours after starting heparin and daily while on heparin.  [name] 06/12/2013,6:46 PM

## 2013-06-12 NOTE — Progress Notes (Signed)
   CARE MANAGEMENT ED NOTE 06/12/2013  Patient:  [name],[name]   Account Number:  [id number]  Date Initiated:  06/12/2013  Documentation initiated by:  Edd Arbour  Subjective/Objective Assessment:   47 yr old medicaid [place] patient who states having issues obtaining his xarelto with his $3 medicaid co pay Pt confirmed with EDP pickering that his co pay is $3     Subjective/Objective Assessment Detail:     Action/Plan:   Action/Plan Detail:   Reviewed EPIC notes & spoke with Dr Rubin Payor & Dr Mahala Menghini about pt concerns related to medication assistance, homelessness & behavioral concerns Pt not Eligible for [facility] or medication assistance at this time   Anticipated DC Date:  06/12/2013     Status Recommendation to Physician:   Result of Recommendation:    Other ED Services  Consult Working Plan    DC Planning Services  Other  PCP issues  Outpatient Services - Pt will follow up  Medication Assistance    Choice offered to / List presented to:            Status of service:    ED Comments:   ED Comments Detail:  Pt homeless and not eligible for home health services unless pt receives safe plan for a residence

## 2013-06-12 NOTE — ED Notes (Signed)
Hospitalist MD at bedside. 

## 2013-06-12 NOTE — ED Notes (Signed)
Patient reports a history of DVT. Patient c/o swelling to the right arm and left leg swelling. Patient states he has been off of his medication/Xarelto x 1 1/2 weeks.

## 2013-06-13 DIAGNOSIS — F10239 Alcohol dependence with withdrawal, unspecified: Secondary | ICD-10-CM

## 2013-06-13 DIAGNOSIS — F1010 Alcohol abuse, uncomplicated: Secondary | ICD-10-CM

## 2013-06-13 LAB — CBC
HCT: 37.9 % — ABNORMAL LOW (ref 39.0–52.0)
Hemoglobin: 12.3 g/dL — ABNORMAL LOW (ref 13.0–17.0)
RDW: 17.3 % — ABNORMAL HIGH (ref 11.5–15.5)
WBC: 6.5 10*3/uL (ref 4.0–10.5)

## 2013-06-13 LAB — COMPREHENSIVE METABOLIC PANEL
Alkaline Phosphatase: 89 U/L (ref 39–117)
BUN: 5 mg/dL — ABNORMAL LOW (ref 6–23)
CO2: 24 mEq/L (ref 19–32)
Calcium: 8.1 mg/dL — ABNORMAL LOW (ref 8.4–10.5)
Chloride: 103 mEq/L (ref 96–112)
Creatinine, Ser: 0.53 mg/dL (ref 0.50–1.35)
GFR calc Af Amer: >90 mL/min (ref >90)
GFR calc non Af Amer: >90 mL/min (ref >90)
Glucose, Bld: 84 mg/dL (ref 70–99)
Total Bilirubin: 0.3 mg/dL (ref 0.3–1.2)
Total Protein: 5.7 g/dL — ABNORMAL LOW (ref 6.0–8.3)

## 2013-06-13 LAB — PROTIME-INR
INR: 0.96 (ref 0.00–1.49)
Prothrombin Time: 12.6 seconds (ref 11.6–15.2)

## 2013-06-13 LAB — HEPARIN LEVEL (UNFRACTIONATED): Heparin Unfractionated: 0.13 IU/mL — ABNORMAL LOW (ref 0.30–0.70)

## 2013-06-13 MED ORDER — HEPARIN (PORCINE) IN NACL 100-0.45 UNIT/ML-% IJ SOLN
1900.0000 [IU]/h | INTRAMUSCULAR | Status: DC
  Administered 2013-06-13: 1900 [IU]/h via INTRAVENOUS
  Filled 2013-06-13 (×2): qty 250

## 2013-06-13 MED ORDER — TRAMADOL HCL 50 MG PO TABS: 50.0000 mg | ORAL_TABLET | Freq: Four times a day (QID) | ORAL | Status: DC | PRN

## 2013-06-13 MED ORDER — HEPARIN (PORCINE) IN NACL 100-0.45 UNIT/ML-% IJ SOLN
1800.0000 [IU]/h | INTRAMUSCULAR | Status: DC
  Filled 2013-06-13 (×2): qty 250

## 2013-06-13 MED ORDER — AMOXICILLIN-POT CLAVULANATE 875-125 MG PO TABS
1.0000 | ORAL_TABLET | Freq: Two times a day (BID) | ORAL | Status: DC
  Administered 2013-06-13 (×2): 1 via ORAL
  Filled 2013-06-13 (×5): qty 1

## 2013-06-13 MED ORDER — ACETAMINOPHEN 325 MG PO TABS: 650.0000 mg | ORAL_TABLET | ORAL | Status: DC | PRN

## 2013-06-13 MED ORDER — HEPARIN (PORCINE) IN NACL 100-0.45 UNIT/ML-% IJ SOLN
1550.0000 [IU]/h | INTRAMUSCULAR | Status: DC
  Administered 2013-06-13: 1550 [IU]/h via INTRAVENOUS
  Filled 2013-06-13: qty 250

## 2013-06-13 MED ORDER — HEPARIN BOLUS VIA INFUSION
2500.0000 [IU] | INTRAVENOUS | Status: AC
  Administered 2013-06-13: 2500 [IU] via INTRAVENOUS
  Filled 2013-06-13: qty 2500

## 2013-06-13 MED ORDER — OXYCODONE HCL 5 MG PO TABS
5.0000 mg | ORAL_TABLET | ORAL | Status: DC | PRN
  Administered 2013-06-13 – 2013-06-14 (×4): 5 mg via ORAL
  Filled 2013-06-13 (×4): qty 1

## 2013-06-13 NOTE — Progress Notes (Signed)
ANTICOAGULATION CONSULT NOTE - Follow Up Consult  Pharmacy Consult for Heparin Indication: DVT  No Known Allergies  Patient Measurements: Height: 6\' 2"  (188 cm) Weight: 191 lb 9.3 oz (86.9 kg) IBW/kg (Calculated) : 82.2 Heparin Dosing Weight: 87kg  Vital Signs: Temp: 98.1 F (36.7 C) (10/07 1405) Temp src: Oral (10/07 1405) BP: 132/75 mmHg (10/07 1405) Pulse Rate: 94 (10/07 1405)  Labs:  Recent Labs  06/12/13 1510 06/12/13 1550 06/13/13 0245 06/13/13 1005 06/13/13 1804  HGB 12.5*  --  12.3*  --   --   HCT 37.6*  --  37.9*  --   --   PLT 194  --  183  --   --   APTT  --  31  --   --   --   LABPROT  --  12.3 12.6  --   --   INR  --  0.93 0.96  --   --   HEPARINUNFRC  --   --  0.24* 0.13* 0.38  CREATININE 0.51  --  0.53  --   --     Estimated Creatinine Clearance: 131.3 ml/min (by C-G formula based on Cr of 0.53).   Assessment: 47 y.o. male, known Lupus AC +, chronic ETOH/TObacco h/o first DVT with thrombectomy of femoral and Iliac veins in 04/2013 presented 10/6 with swelling in his left lower extremity. He has been off of Xarelto for ~2 weeks.  Heparin level now in therapeutic range at 0.38  No bleeding or infusion line issues per RN  CBC stable, no bleeding reported  Plan is for 2 days IV heparin, then transition back to Xarelto  Goal of Therapy:  Heparin level 0.3-0.7 units/ml Monitor platelets by anticoagulation protocol: Yes   Plan:   Increase infusion rate to 1900 units/hr to keep in therapeutic range  Recheck heparin level in 6hrs  Daily heparin level  Monitor for bleeding  Follow-up oral anticoagulant plans  Thank you for the consult.  Tomi Bamberger, PharmD Clinical Pharmacist Pager: [phone] Pharmacy: [phone] 06/13/2013 7:28 PM

## 2013-06-13 NOTE — Progress Notes (Signed)
Pt has Medicaid medication coverage, unable to assist with medications at this time. Thanks.

## 2013-06-13 NOTE — Progress Notes (Signed)
ANTICOAGULATION CONSULT NOTE - Follow Up Consult  Pharmacy Consult for Heparin Indication: DVT  No Known Allergies  Patient Measurements: Height: 6\' 2"  (188 cm) Weight: 191 lb 9.3 oz (86.9 kg) IBW/kg (Calculated) : 82.2 Heparin Dosing Weight: 87kg  Vital Signs: Temp: 98.4 F (36.9 C) (10/07 0614) Temp src: Oral (10/07 0614) BP: 149/85 mmHg (10/07 0614) Pulse Rate: 87 (10/07 0614)  Labs:  Recent Labs  06/12/13 1510 06/12/13 1550 06/13/13 0245 06/13/13 1005  HGB 12.5*  --  12.3*  --   HCT 37.6*  --  37.9*  --   PLT 194  --  183  --   APTT  --  31  --   --   LABPROT  --  12.3 12.6  --   INR  --  0.93 0.96  --   HEPARINUNFRC  --   --  0.24* 0.13*  CREATININE 0.51  --  0.53  --     Estimated Creatinine Clearance: 131.3 ml/min (by C-G formula based on Cr of 0.53).   Medications:  Scheduled:  . folic acid  1 mg Oral Daily  . multivitamin with minerals  1 tablet Oral Daily  . nicotine  42 mg Transdermal Daily  . thiamine  100 mg Oral Daily   Or  . thiamine  100 mg Intravenous Daily   Infusions:  . heparin 1,550 Units/hr (06/13/13 0645)    Assessment: 47 y.o. male, known Lupus AC +, chronic ETOH/TObacco h/o first DVT with thrombectomy of femoral and Iliac veins in 04/2013 presented 10/6 with swelling in his left lower extremity. He has been off of Xarelto for ~2 weeks.  Heparin level subtherapeutic, verified that IV is running at correct rate of 1550 units/hr and no interruptions per RN.  CBC stable, no bleeding reported  Plan is for 2 days IV heparin, then transition back to Xarelto  Goal of Therapy:  Heparin level 0.3-0.7 units/ml Monitor platelets by anticoagulation protocol: Yes   Plan:   Rebolus heparin 2500 units IV x 1  Increase infusion rate to 1800 units/hr  Recheck heparin level in 6hrs  Loralee Pacas, PharmD, BCPS Pager: [phone] 06/13/2013,10:43 AM

## 2013-06-13 NOTE — Progress Notes (Signed)
[facility] PROGRESS NOTE  [name] JYN:[id number] DOB: April 28, 1966 DOA: 06/12/2013 PCP: Default, Provider, MD Brief HPI: [name] is a 47 y.o. male, known Lupus AC +, chronic ETOH/TObacco h/o First DVT with thrombectomy of Femoral and Iliac veins in 04/2013 presented to [facility] long ED 06/12/2013 with swelling in his left lower extremity. He also said to swelling in his right upper extremity. He is noncompliant to xarelto and has been off xarelto for   about 2 weeks.  He was found to have new acute DVT in the gastrocnemius veins. He was admitted to be started on IV heparin for atleast 2 days before restarting xarelto.   Assessment/Plan: 1.  1. Acute upper extremity DVT-oncology was consulted and recommended 2 days of IV heparin given his symptomatology .will not get a CT scan of chest as this would not change overall management. He will need 2 days of IV heparin and then transitioned  To xarelto .  2. History of ethanol use-placed on CIWA protocol 3. lupus anticoagulant disorder-on chronic anticoagulation 4. Tobacco abuse will place on Nicoderm 42 mg 5. Likely Multifactorial anemia-monitor. Continue Folic acid 6. Homelessness  Code Status: full code Family Communication: none at bedside Disposition Plan: pending   Consultants:  none  Procedures:  Venous duplex  Antibiotics:  none  HPI/Subjective: Pain int he legs and arm.   Objective: Filed Vitals:   06/13/13 0614  BP: 149/85  Pulse: 87  Temp: 98.4 F (36.9 C)  Resp: 16    Intake/Output Summary (Last 24 hours) at 06/13/13 1304 Last data filed at 06/13/13 1138  Gross per 24 hour  Intake 545.97 ml  Output    875 ml  Net -329.03 ml   Filed Weights   06/12/13 1738 06/12/13 2132  Weight: 88.905 kg (196 lb) 86.9 kg (191 lb 9.3 oz)    Exam:   General:  Alert afebrile comfortable.   Cardiovascular: s1s2  Respiratory: ctab  Abdomen: soft NT ND BS +  Musculoskeletal:  Left LEG EDEMA, with superficial  thrombophlebitis.    Data Reviewed: Basic Metabolic Panel:  Recent Labs Lab 06/12/13 1510 06/13/13 0245  NA 137 136  K 4.0 3.7  CL 101 103  CO2 22 24  GLUCOSE 73 84  BUN 3* 5*  CREATININE 0.51 0.53  CALCIUM 8.2* 8.1*   Liver Function Tests:  Recent Labs Lab 06/12/13 1550 06/13/13 0245  AST 42* 31  ALT 24 20  ALKPHOS 91 89  BILITOT 0.2* 0.3  PROT 6.5 5.7*  ALBUMIN 2.7* 2.3*   No results found for this basename: LIPASE, AMYLASE,  in the last 168 hours No results found for this basename: AMMONIA,  in the last 168 hours CBC:  Recent Labs Lab 06/12/13 1510 06/13/13 0245  WBC 6.8 6.5  NEUTROABS 4.4  --   HGB 12.5* 12.3*  HCT 37.6* 37.9*  MCV 97.7 97.9  PLT 194 183   Cardiac Enzymes: No results found for this basename: CKTOTAL, CKMB, CKMBINDEX, TROPONINI,  in the last 168 hours BNP (last 3 results) No results found for this basename: PROBNP,  in the last 8760 hours CBG: No results found for this basename: GLUCAP,  in the last 168 hours  No results found for this or any previous visit (from the past 240 hour(s)).   Studies: No results found.  Scheduled Meds: . folic acid  1 mg Oral Daily  . multivitamin with minerals  1 tablet Oral Daily  . nicotine  42 mg Transdermal Daily  .  thiamine  100 mg Oral Daily   Or  . thiamine  100 mg Intravenous Daily   Continuous Infusions: . heparin 1,800 Units/hr (06/13/13 1124)    Principal Problem:   Upper leg DVT (deep venous thromboembolism), acute Active Problems:   Tobacco abuse   Lupus anticoagulant disorder   Alcohol withdrawal   Major depressive disorder, recurrent episode, severe, without mention of psychotic behavior   Homeless single person    Time spent: 35 minutes    Corita Allinson  [facility] Pager [phone]. If 7PM-7AM, please contact night-coverage at [email or web address], password [facility] 06/13/2013, 1:04 PM  LOS: 1 day

## 2013-06-13 NOTE — Progress Notes (Signed)
Pt c/o pain in left leg-states redness in upper left thigh is new. MD paged.

## 2013-06-13 NOTE — Progress Notes (Signed)
ANTICOAGULATION CONSULT NOTE - Follow Up Consult  Pharmacy Consult for Heparin Indication: VTE Treatment  No Known Allergies  Patient Measurements: Height: 6\' 2"  (188 cm) Weight: 191 lb 9.3 oz (86.9 kg) IBW/kg (Calculated) : 82.2 Heparin Dosing Weight:   Vital Signs: Temp: 98.5 F (36.9 C) (10/06 2132) Temp src: Oral (10/06 2132) BP: 121/65 mmHg (10/06 2132) Pulse Rate: 93 (10/06 2132)  Labs:  Recent Labs  06/12/13 1510 06/12/13 1550 06/13/13 0245  HGB 12.5*  --  12.3*  HCT 37.6*  --  37.9*  PLT 194  --  183  APTT  --  31  --   LABPROT  --  12.3 12.6  INR  --  0.93 0.96  HEPARINUNFRC  --   --  0.24*  CREATININE 0.51  --   --     Estimated Creatinine Clearance: 131.3 ml/min (by C-G formula based on Cr of 0.51).   Medications:  Infusions:  . heparin      Assessment: Patient with low heparin level.  RN noted some iv line issues around 2200 10/6.  Drip could have been interrupted for up to , but probably less.  No other issues.  Goal of Therapy:  Heparin level 0.3-0.7 units/ml Monitor platelets by anticoagulation protocol: Yes   Plan:  Increase heparin to only 1550 units/hr (due to possible false low due to iv line issues) Recheck level at 1000  [name], [name] 06/13/2013,3:15 AM

## 2013-06-13 NOTE — Progress Notes (Signed)
Asked to call previous PCP to see if pt could return.  Call revealed, Pt can return.  Center now under new management with new MD. Pt will need to go there, also will need his Medicaid Card changed to [employer] of Care, [phone]. Hours 8-5 PM. Pt was made aware with [facility].

## 2013-06-14 DIAGNOSIS — D649 Anemia, unspecified: Secondary | ICD-10-CM

## 2013-06-14 LAB — HEPARIN LEVEL (UNFRACTIONATED): Heparin Unfractionated: 0.32 IU/mL (ref 0.30–0.70)

## 2013-06-14 MED ORDER — TRAMADOL HCL 50 MG PO TABS: 50.0000 mg | ORAL_TABLET | Freq: Four times a day (QID) | ORAL | Status: DC | PRN

## 2013-06-14 MED ORDER — INFLUENZA VAC SPLIT QUAD 0.5 ML IM SUSP
0.5000 mL | Freq: Once | INTRAMUSCULAR | Status: AC
  Administered 2013-06-14: 15:00:00 0.5 mL via INTRAMUSCULAR
  Filled 2013-06-14: qty 0.5

## 2013-06-14 MED ORDER — NICOTINE 21 MG/24HR TD PT24: 1.0000 | MEDICATED_PATCH | Freq: Every day | TRANSDERMAL | Status: DC

## 2013-06-14 MED ORDER — TRAZODONE HCL 50 MG PO TABS: 50.0000 mg | ORAL_TABLET | Freq: Every evening | ORAL | Status: DC | PRN

## 2013-06-14 MED ORDER — RIVAROXABAN 15 MG PO TABS
15.0000 mg | ORAL_TABLET | Freq: Two times a day (BID) | ORAL | Status: DC
  Administered 2013-06-14: 09:00:00 15 mg via ORAL
  Filled 2013-06-14 (×3): qty 1

## 2013-06-14 MED ORDER — RIVAROXABAN 15 MG PO TABS: 15.0000 mg | ORAL_TABLET | Freq: Two times a day (BID) | ORAL | Status: DC

## 2013-06-14 MED ORDER — HEPARIN (PORCINE) IN NACL 100-0.45 UNIT/ML-% IJ SOLN
2000.0000 [IU]/h | INTRAMUSCULAR | Status: DC
  Filled 2013-06-14: qty 250

## 2013-06-14 MED ORDER — RIVAROXABAN 20 MG PO TABS: 20.0000 mg | ORAL_TABLET | Freq: Every day | ORAL | Status: DC

## 2013-06-14 MED ORDER — THIAMINE HCL 100 MG PO TABS: 100.0000 mg | ORAL_TABLET | Freq: Every day | ORAL | Status: DC

## 2013-06-14 MED ORDER — ADULT MULTIVITAMIN W/MINERALS CH: 1.0000 | ORAL_TABLET | Freq: Every day | ORAL | Status: DC

## 2013-06-14 NOTE — Progress Notes (Addendum)
ANTICOAGULATION CONSULT NOTE - Follow Up Consult  Pharmacy Consult for Heparin Indication: DVT  No Known Allergies  Patient Measurements: Height: 6\' 2"  (188 cm) Weight: 191 lb 9.3 oz (86.9 kg) IBW/kg (Calculated) : 82.2 Heparin Dosing Weight:   Vital Signs: Temp: 98.5 F (36.9 C) (10/08 0411) Temp src: Oral (10/08 0411) BP: 118/70 mmHg (10/08 0411) Pulse Rate: 82 (10/08 0411)  Labs:  Recent Labs  06/12/13 1510 06/12/13 1550  06/13/13 0245 06/13/13 1005 06/13/13 1804 06/14/13 0201  HGB 12.5*  --   --  12.3*  --   --   --   HCT 37.6*  --   --  37.9*  --   --   --   PLT 194  --   --  183  --   --   --   APTT  --  31  --   --   --   --   --   LABPROT  --  12.3  --  12.6  --   --   --   INR  --  0.93  --  0.96  --   --   --   HEPARINUNFRC  --   --   < > 0.24* 0.13* 0.38 0.32  CREATININE 0.51  --   --  0.53  --   --   --   < > = values in this interval not displayed.  Estimated Creatinine Clearance: 131.3 ml/min (by C-G formula based on Cr of 0.53).   Medications:  Infusions:  . heparin 1,900 Units/hr (06/13/13 1946)    Assessment: Patient with heparin drip at goal a second time.  No issues noted.  Goal of Therapy:  Heparin level 0.3-0.7 units/ml Monitor platelets by anticoagulation protocol: Yes   Plan:  Increase heparin to 2000 units/hr, recheck level at [address] Jacquenette Shone Crowford 06/14/2013,5:43 AM

## 2013-06-14 NOTE — Discharge Summary (Signed)
Physician Discharge Summary  [name] WUJ:[id number] DOB: 06/28/1966 DOA: 06/12/2013  PCP: Default, Provider, MD  Admit date: 06/12/2013 Discharge date: 06/14/2013  Recommendations for Outpatient Follow-up:  1. Follow up with primary care doctor in 1-2 weeks for repeat CBC to check for bleeding/anemia while on xarelto and to provide new prescription for xarelto 20mg  daily to start after initial BID dosing complete in three weeks, new dose to start on October 29th.    Discharge Diagnoses:  Principal Problem:   Upper leg DVT (deep venous thromboembolism), acute Active Problems:   Tobacco abuse   Lupus anticoagulant disorder   Alcohol withdrawal   Major depressive disorder, recurrent episode, severe, without mention of psychotic behavior   Homeless single person   Discharge Condition: stable, improved  Diet recommendation: regular  Wt Readings from Last 3 Encounters:  06/12/13 86.9 kg (191 lb 9.3 oz)  04/21/13 88.043 kg (194 lb 1.6 oz)  04/21/13 88.043 kg (194 lb 1.6 oz)    History of present illness:  Steel Kerney is a 47 y.o. male, known Lupus AC +, chronic ETOH/TObacco h/o First DVT with thrombectomy of Femoral and Iliac veins in 04/2013 presented to [facility] long ED 06/12/2013 with swelling in his left lower extremity. He also said to swelling in his right upper extremity. He is noncompliant on his arrival having taken that about 2 weeks ago last time. He is homeless and lives in a tent and during the winter towards himself with the heater. He panhandles and gets money for alcohol and drinks about one case of 24 bottles a day vs. a fifth of liquor when he can get it. His last dose of Xarelto was about 2-1/2 weeks ago.  He states that he's been more Keairra Bardon of breath than usual as he can usually walk more the 50-75 but as of 4 days ago he has been having more windedness with this.  He denies any overt chest pain but states that he has some reflux issues as he eats spicy food  Cage  questions are 2/4   Hospital Course:   Acute right upper extremity DVT with previous known left lower extremity DVT.  He was placed on IV heparin at the recommendation of Hematology/Oncology which he continued for two days.  He was then transitioned to xarelto 15mg  BID on 10/8.  He states that he can afford the medication because he has a free year-long prescription card which expires on April, however, typically he runs out of medication after one month because he does not have a primary care doctor that he can get a refill from.  The case manager met with him repeatedly and explained that he has a primary care doctor on [facility] that he can schedule an appointment with.  The name and number are on his insurance care.  He lives a couple of miles from where the office is, but gets around via bus or by walking.  He panhandles for money and is able to afford 24 bottles of beer a day or a fifth of liquor a day.  He was advised he could use this money for transportation to his appointment to get his refills.  He appears pre-contemplative at this time.    Alcohol withdrawal:  Placed on CIWA protocol and currently appears well.  Recommended cessation of alcohol use and given resources in the area.  Recommended to continue folate and thiamine.    Lupus anticoagulant.  He should continue life-long anticoagulation.  Cigarette nicotine dependence with withdrawal.  Started on nicotine patch.  Normocytic anemia, likely due to chronic disease, alcohol abuse, possible vitamin deficiencies.    Homeless and given resources for the area.     Consultants:  none Procedures:  Venous duplex Antibiotics:  Augmentin   Discharge Exam: Filed Vitals:   06/14/13 0411  BP: 118/70  Pulse: 82  Temp: 98.5 F (36.9 C)  Resp: 19   Filed Vitals:   06/13/13 0614 06/13/13 1405 06/13/13 2102 06/14/13 0411  BP: 149/85 132/75 115/57 118/70  Pulse: 87 94 92 82  Temp: 98.4 F (36.9 C) 98.1 F (36.7 C) 98.6 F (37 C)  98.5 F (36.9 C)  TempSrc: Oral Oral Oral Oral  Resp: 16 16 18 19   Height:      Weight:      SpO2: 94% 98% 98% 96%    General: CM, no acute distress HEENT:  NCAT, MMM, no fasciculations Cardiovascular: RRR, no mrg, nl S1, S2, 2+ pulses Respiratory: CTAB, no increased WOB ABD:  NABS, soft, ND/NT MSK:  Palpable cord and 1+ LEE of LLE and RUE.  No warmth.    Discharge Instructions      Discharge Orders   Future Appointments Provider Department Dept Phone   07/14/2013 9:00 AM Krista Blue [facility] MEDICAL ONCOLOGY [phone]   07/14/2013 9:30 AM Artis Delay, MD [facility] MEDICAL ONCOLOGY [phone]   Future Orders Complete By Expires   Call MD for:  difficulty breathing, headache or visual disturbances  As directed    Call MD for:  extreme fatigue  As directed    Call MD for:  hives  As directed    Call MD for:  persistant dizziness or light-headedness  As directed    Call MD for:  persistant nausea and vomiting  As directed    Call MD for:  severe uncontrolled pain  As directed    Call MD for:  temperature >100.4  As directed    Diet general  As directed    Discharge instructions  As directed    Comments:     You were hospitalized with new blood clots.  You have lupus anticoagulant, a problem with your blood which causes it to clot.  You are at increased risk of stroke, heart attack, deep vein clots, and pulmonary embolism or blood clots of the lungs which can cause death.  You should stay on blood thinners for the rest of your life to reduce your risk of blood clot.  Please take xarelto 15mg  tabs twice a day for the next three weeks.  After the first three weeks (all the tabs should be gone) start taking the 20mg  tabs ONCE a day.  You have an appointment with oncology already scheduled for next month.  Also, you have been given information about primary care doctors.  Please get an appointment scheduled before you leave the hospital. You will  need to plan in advance to save money for transportation to this appointment.  I strongly encourage you to stop drinking and take advantage of the resources the social worker and case manager have provided for you.   Increase activity slowly  As directed        Medication List         BC HEADACHE PO  Take 1 each by mouth daily as needed (pain).     folic acid 1 MG tablet  Commonly known as:  FOLVITE  Take 1 mg by mouth daily as needed.  multivitamin with minerals Tabs tablet  Take 1 tablet by mouth daily.     nicotine 21 mg/24hr patch  Commonly known as:  NICODERM CQ - dosed in mg/24 hours  Place 1 patch onto the skin daily.     Rivaroxaban 15 MG Tabs tablet  Commonly known as:  XARELTO  Take 1 tablet (15 mg total) by mouth 2 (two) times daily with a meal.     Rivaroxaban 20 MG Tabs tablet  Commonly known as:  XARELTO  Take 1 tablet (20 mg total) by mouth daily. Start after 15 mg tabs are gone in 3 weeks.     thiamine 100 MG tablet  Take 1 tablet (100 mg total) by mouth daily.     traMADol 50 MG tablet  Commonly known as:  ULTRAM  Take 1 tablet (50 mg total) by mouth every 6 (six) hours as needed.     traZODone 50 MG tablet  Commonly known as:  DESYREL  Take 1 tablet (50 mg total) by mouth at bedtime as needed for sleep.          The results of significant diagnostics from this hospitalization (including imaging, microbiology, ancillary and laboratory) are listed below for reference.    Significant Diagnostic Studies: No results found.  Microbiology: No results found for this or any previous visit (from the past 240 hour(s)).   Labs: Basic Metabolic Panel:  Recent Labs Lab 06/12/13 1510 06/13/13 0245  NA 137 136  K 4.0 3.7  CL 101 103  CO2 22 24  GLUCOSE 73 84  BUN 3* 5*  CREATININE 0.51 0.53  CALCIUM 8.2* 8.1*   Liver Function Tests:  Recent Labs Lab 06/12/13 1550 06/13/13 0245  AST 42* 31  ALT 24 20  ALKPHOS 91 89  BILITOT 0.2* 0.3   PROT 6.5 5.7*  ALBUMIN 2.7* 2.3*   No results found for this basename: LIPASE, AMYLASE,  in the last 168 hours No results found for this basename: AMMONIA,  in the last 168 hours CBC:  Recent Labs Lab 06/12/13 1510 06/13/13 0245  WBC 6.8 6.5  NEUTROABS 4.4  --   HGB 12.5* 12.3*  HCT 37.6* 37.9*  MCV 97.7 97.9  PLT 194 183   Cardiac Enzymes: No results found for this basename: CKTOTAL, CKMB, CKMBINDEX, TROPONINI,  in the last 168 hours BNP: BNP (last 3 results) No results found for this basename: PROBNP,  in the last 8760 hours CBG: No results found for this basename: GLUCAP,  in the last 168 hours  Time coordinating discharge: 45 minutes  Signed:  Shailey Butterbaugh  [facility] 06/14/2013, 12:46 PM

## 2013-06-14 NOTE — Progress Notes (Signed)
A call was made to DSS in [facility]. Spoke with Angelique Blonder [phone] concerning, if pt had to go to the assigned name on card, PCP list and Procedure to change PCP.   A visit to [email or web address] web page revealed: the local Department of Social Services (DSS) will link pt to the new provider that they have chosen.  A change cannot be made to a current month; however, if the change is made before the cut-off of that month (four working days before the end of the month), it will be effective the first day of the following month.   This CM asked Shanda Bumps, RN, CN and Maurine Minister, RN to come in pt's room with me.  This CM spoke with pt to reiterate his Medicaid card has assigned him to [facility], [address], for PCP.  Pt states he cannot walk that far, no money to ride a bus.  Information given and explained to pt each sheet of information given to him to change PCP on Medicaid card, PCP's taking Medicaid pt's, [place] and [facility] information, transportation services from [place] [phone] to and from PCP office.  Copies given to pt with all of the above information from: [facility], [facility] and [employer].  Teach Back given. Pt repeated what he needs to do to change PCP and repeated each step.  Pt then stated, "I know what you are saying, I am not dumb."   This CM asked pt if he wanted me to make an appointment for him. Pt states, "All are walk in appointments; I am not sure of what day or time I will go. I will need to call also for transportation 24 hours before I go. I will need to tell them where to pick me up at."

## 2013-06-14 NOTE — Progress Notes (Signed)
Patient is discharged. RN gave patient bus pass and went over AVS with no questions at this time. Patient thanked Charity fundraiser. J.Annaston Upham, RN

## 2013-06-14 NOTE — Progress Notes (Signed)
Assuming Care of patient. Received report and agree with previous assessment. J.Laguana Desautel, RN

## 2013-06-14 NOTE — Progress Notes (Signed)
CSW received referral to assist with transportation.   Patient provided with bus pass. Patient states he will [place].   No further CSW needs identified.   Unice Bailey, LCSW [facility] Clinical Social Worker cell #: [phone]

## 2013-06-20 NOTE — H&P (Signed)
[facility] History and Physical  [name] [id number] DOB: June 26, 1966 DOA: 06/12/2013  Referring physician: Rubin Payor, MD PCP: Default, Provider, MD  Specialists: none currently  Chief Complaint: Acute DVT  HPI: [name] is a 47 y.o. male, known Lupus AC +, chronic ETOH/TObacco h/o First DVT with thrombectomy of Femoral and Iliac veins in 04/2013 presented to [facility] long ED 06/12/2013 with swelling in his left lower extremity. He also said to swelling in his right upper extremity. He is noncompliant on his arrival having taken that about 2 weeks ago last time. He is homeless and lives in a tent and during the winter towards himself with the heater. He panhandles and gets money for alcohol and drinks about one case of 24 bottles a day vs. a fifth of liquor when he can get it. His last dose of Xarelto was about 2-1/2 weeks ago. He states that he's been more short of breath than usual as he can usually walk more the 50-75 but as of 4 days ago he has been having more windedness with this. He denies any overt chest pain but states that he has some reflux issues as he eats spicy food Cage questions are 2/4   Review of Systems: The patient denies blurred vision double vision falls weakness rash fever or sputum diarrhea nausea constipation dysphagia dysuria abdominal pain  Past Medical History  Diagnosis Date  . Coronary artery disease   . Degenerative joint disease of spine   . Pulmonary embolism 06/2010  . Degenerative joint disease   . 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 ETOH abuse   . DVT (deep venous thrombosis)   . Hepatitis B   . Mental disorder   . Depression    Past Surgical History  Procedure Laterality Date  . Left elbow surgery    . Tonsillectomy     Social History:  reports that he has been smoking Cigarettes.  He has a 60 pack-year smoking history. He has never used smokeless tobacco. He reports that he drinks alcohol. He reports that he does not use  illicit drugs. 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 Cancer Mother     Ovarian     Prior to Admission medications   Medication Sig Start Date End Date Taking? Authorizing Provider  Aspirin-Salicylamide-Caffeine (BC HEADACHE PO) Take 1 each by mouth daily as needed (pain).   Yes Historical Provider, MD  folic acid (FOLVITE) 1 MG tablet Take 1 mg by mouth daily as needed. 04/03/13  Yes Kathlen Mody, MD  Rivaroxaban (XARELTO) 20 MG TABS tablet Take 20 mg by mouth daily. 04/25/13  Yes Rodolph Bong, MD  traZODone (DESYREL) 50 MG tablet Take 1 tablet (50 mg total) by mouth at bedtime as needed for sleep. 04/25/13  Yes Rodolph Bong, MD   Physical Exam: Filed Vitals:   06/14/13 1402  BP: 114/60  Pulse: 90  Temp: 98.1 F (36.7 C)  Resp: 18     General:  Disheveled obese Caucasian male Body mass index is 24.59 kg/(m^2).  Eyes: EOMI, no pallor no icterus  ENT: Soft supple no JVD no thyromegaly  Neck: C. above-in addition poor dentition  Cardiovascular: S1-S2 no murmur rub or gallop  Respiratory: Clinically clear no added sound  Abdomen: Soft nontender no rebound no guarding bowel sounds normal  Skin: Tattoo on left thigh both knees-erythema to right inner aspect of arm and forearm with some redness and some tenderness  Musculoskeletal: Range of motion intact  Psychiatric: Flat affect  Neurologic: Grossly intact moving all 4 limbs equally  Labs on  Admission:  Basic Metabolic Panel: No results found for this basename: NA, K, CL, CO2, GLUCOSE, BUN, CREATININE, CALCIUM, MG, PHOS,  in the last 168 hours Liver Function Tests: No results found for this basename: AST, ALT, ALKPHOS, BILITOT, PROT, ALBUMIN,  in the last 168 hours No results found for this basename: LIPASE, AMYLASE,  in the last 168 hours No results found for this basename: AMMONIA,  in the last 168 hours CBC: No results found for this basename: WBC, NEUTROABS, HGB, HCT, MCV, PLT,  in the last 168 hours Cardiac Enzymes: No results found for this basename: CKTOTAL, CKMB, CKMBINDEX, TROPONINI,  in the last 168 hours  BNP (last 3 results) No results found for this basename: PROBNP,  in the last 8760 hours CBG: No results found for this basename: GLUCAP,  in the last 168 hours  Radiological Exams on Admission: No results found.  EKG: Independently reviewed. None performed  Assessment/Plan Principal Problem:   Upper leg DVT (deep venous thromboembolism), acute Active Problems:   Tobacco abuse   Lupus anticoagulant disorder   Alcohol withdrawal   Major depressive disorder, recurrent episode, severe, without mention of psychotic behavior   Homeless single person   1. Acute upper extremity DVT-oncology was consulted and recommended 2 days of IV heparin given his symptomatology may be suspicious for DVT of the chest. I will not get a CT scan of chest as this would not change overall management. He will need 2 days of IV heparin and then transitioned at actors are out to [facility] has coupon to get this at pharmacy and just needs prescription] or Coumadin-we will potentially discuss this with oncologist to discuss the case with Dr. Rubin Payor of the ED.  Needs telemetry to monitor for arrhythmias given potential for decompensation 2. History of ethanol use-placed on CIWA protocol 3.  lupus anticoagulant disorder-potentially will also need to be on antithrombotic medication in addition  to anticoagulant 4. Tobacco abuse will place on Nicoderm 42 mg 5. Likely Multifactorial anemia-monitor.  Continue Folic acid 6. Homelessness  Time spent: 60  Mahala Menghini [facility] Pager [phone]  If 7PM-7AM, please contact night-coverage [email or web address] Password [email or web address] 06/20/2013, 10:03 AM

## 2013-07-14 ENCOUNTER — Encounter: Payer: Self-pay | Admitting: Hematology and Oncology

## 2013-07-14 ENCOUNTER — Encounter: Payer: Self-pay | Admitting: *Deleted

## 2013-07-14 ENCOUNTER — Ambulatory Visit (HOSPITAL_BASED_OUTPATIENT_CLINIC_OR_DEPARTMENT_OTHER): Payer: Medicaid Other | Admitting: Hematology and Oncology

## 2013-07-14 ENCOUNTER — Other Ambulatory Visit: Payer: Self-pay | Admitting: Lab

## 2013-07-14 ENCOUNTER — Telehealth: Payer: Self-pay | Admitting: Hematology and Oncology

## 2013-07-14 VITALS — BP 156/92 | HR 89 | Temp 97.6°F | Resp 18 | Ht 74.0 in | Wt 196.1 lb

## 2013-07-14 DIAGNOSIS — I82409 Acute embolism and thrombosis of unspecified deep veins of unspecified lower extremity: Secondary | ICD-10-CM

## 2013-07-14 DIAGNOSIS — Z720 Tobacco use: Secondary | ICD-10-CM

## 2013-07-14 DIAGNOSIS — I2699 Other pulmonary embolism without acute cor pulmonale: Secondary | ICD-10-CM

## 2013-07-14 DIAGNOSIS — F1010 Alcohol abuse, uncomplicated: Secondary | ICD-10-CM

## 2013-07-14 DIAGNOSIS — F17200 Nicotine dependence, unspecified, uncomplicated: Secondary | ICD-10-CM

## 2013-07-14 DIAGNOSIS — R894 Abnormal immunological findings in specimens from other organs, systems and tissues: Secondary | ICD-10-CM

## 2013-07-14 MED ORDER — RIVAROXABAN 20 MG PO TABS: 20.0000 mg | ORAL_TABLET | Freq: Every day | ORAL | Status: DC

## 2013-07-14 NOTE — Progress Notes (Signed)
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 Patient is lupus anticoagulant positive. Patient has a history of pulmonary embolism in October 2011. Patient was very noncompliant with Coumadin monitoring due to lack of transportation and being homeless. S/P IVC filter placement in October 2011. He was started on Xarelto in April 2014 but this was stopped due to propagation of his left lower extremity DVT. He was placed on Lovenox 90 mg twice a day started in April 2014. Most recently, since October, his anticoagulation therapy was switched to Xarelto due to cost issues  INTERVAL HISTORY: [name] 47 y.o. male returns for further followup. The patient denies any recent signs or symptoms of bleeding such as spontaneous epistaxis, hematuria or hematochezia. The patient stated he has been compliant taking his Xarelto He still had persistent lower extremity edema. Previously noted cellulitis has resolved.   I have reviewed the past medical history, past surgical history, social history and family history with the patient and they are unchanged from previous note.  ALLERGIES:  has No Known Allergies.  MEDICATIONS:  Current Outpatient Prescriptions  Medication Sig Dispense Refill  . Aspirin-Salicylamide-Caffeine (BC HEADACHE PO) Take 1 each by mouth daily as needed (pain).      . Rivaroxaban (XARELTO) 20 MG TABS tablet Take 1 tablet (20 mg total) by mouth daily.  30 tablet  6  . folic acid (FOLVITE) 1 MG tablet Take 1 mg by mouth daily as needed.      . Multiple Vitamin (MULTIVITAMIN WITH MINERALS) TABS tablet Take 1 tablet by mouth daily.  30 tablet  0  . nicotine (NICODERM CQ - DOSED IN MG/24 HOURS) 21 mg/24hr patch Place 1 patch onto the skin daily.  28 patch  0  . thiamine 100 MG tablet Take 1 tablet  (100 mg total) by mouth daily.  30 tablet  0  . traMADol (ULTRAM) 50 MG tablet Take 1 tablet (50 mg total) by mouth every 6 (six) hours as needed.  30 tablet  0  . traZODone (DESYREL) 50 MG tablet Take 1 tablet (50 mg total) by mouth at bedtime as needed for sleep.  30 tablet  0   No current facility-administered medications for this visit.     REVIEW OF SYSTEMS:   Constitutional: Denies fevers, chills or night sweats Behavioral/Psych: Mood is stable, no new changes  All other systems were reviewed with the patient and are negative.  PHYSICAL EXAMINATION: ECOG PERFORMANCE STATUS: 0 - Asymptomatic  Filed Vitals:   07/14/13 0835  BP: 156/92  Pulse: 89  Temp: 97.6 F (36.4 C)  Resp: 18   Filed Weights   07/14/13 0835  Weight: 196 lb 1.6 oz (88.95 kg)    GENERAL:alert, no distress and comfortable SKIN: skin color, texture, turgor are normal,  noted persistent changes in the left lower extremity consistent with chronic dermatitis changes  HEART:  persistent left lower extremity edema NEURO: alert & oriented x 3 with fluent speech, no focal motor/sensory deficits  LABORATORY DATA:  I have reviewed the data as listed ASSESSMENT:  Recurrent DVT and PE   PLAN:  #1 recurrent DVT and PE This is due ti history of noncompliance. He also tested positive for lupus anticoagulant in the past. Duration of therapy is life long. He has been tried on various options and at present time Xarelto is  the best option for him due to cost issue I consider to encourage him compliance. I gave him a prescription with multiple refills. Months. I will continue to see him every 6 months with history, physical examination, and but will. #2 heavy smoking I encouraged the patient to quit smoking. He is not interested to quit. #3 significant alcoholism The patient has significant alcoholism and continued to drink heavily. I encouraged him to quit drinking and again the patient is not interested #4 per  social circumstances with financial issues The patient has Medicaid and states that he has no problem pain for his prescription medication. He has transportation issues and I'm getting our Child psychotherapist to help. [place] and not interested to go to a shelter I also helped him to identify a primary care provider for him.  All questions were answered. The patient knows to call the clinic with any problems, questions or concerns. No barriers to learning was detected.  I spent 15 minutes counseling the patient face to face. The total time spent in the appointment was 20 minutes and more than 50% was on counseling.     Tristine Langi, MD 07/14/2013 9:01 AM

## 2013-07-14 NOTE — Telephone Encounter (Signed)
gv and printed appt sched and avs for pt for May 2015...SW gv pt bus pass

## 2013-07-14 NOTE — Progress Notes (Signed)
[facility] Clinical Social Work  Clinical Social Work was referred by Systems developer for assessment of psychosocial needs.  Clinical Social Worker met with patient at [facility] after his appointment  to offer support and assess for needs.  Pt requesting bus pass today. CSW provided bus pass and attempted to inquire about other needs, as Pt appears homeless. Pt reports he is homeless and denies wanting assistance with this issue. CSW inquired about follow up and additional appointments. Pt reports he returns in May 2015 for a follow up appointment. Pt provided with CSW contact info and denies other concerns.     Clinical Social Work interventions: Bus pass Education on community resources  [employer], [place] Clinical Social Worker [facility] Wednesday, Thursday and Friday Phone: [phone] Fax: [phone]

## 2013-08-08 IMAGING — CT CT ANGIO CHEST
1 of 3 series · 18 of 33 positions shown · IV contrast (APPLIED)
Comparison: 03/01/2011

CLINICAL DATA: Shortness of breath, left leg pain, history of deep
venous thrombosis, question pulmonary embolism

CT ANGIOGRAPHY CHEST
TECHNIQUE: Multidetector CT imaging of the chest using the
standard protocol during bolus administration of intravenous
contrast. Multiplanar reconstructed images including MIPs were
obtained and reviewed to evaluate the vascular anatomy. Due to
inadequate opacification of the pulmonary arterial system on
initial exam, the patient was reinjected with contrast and the exam
repeated.
Contrast: 100mL OMNIPAQUE IOHEXOL 300 MG/ML SOLN; 80 ml of
Omnipaque 300

[Series 10: thins for pacs · axial · 0.74mm/px · z∈[-36,+210]mm · 18 of 282 slices shown]
[im 18/282  lung]
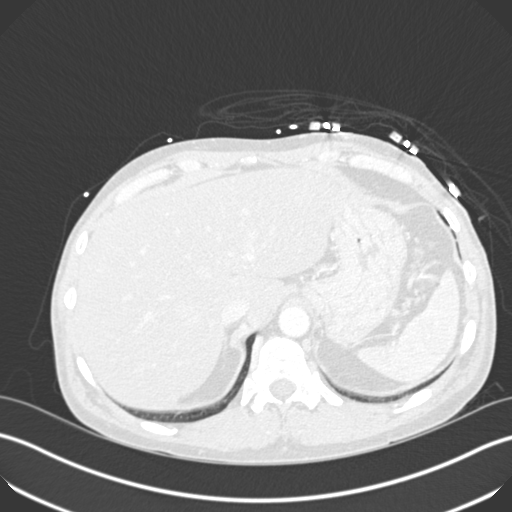
[im 36/282  mediastinal]
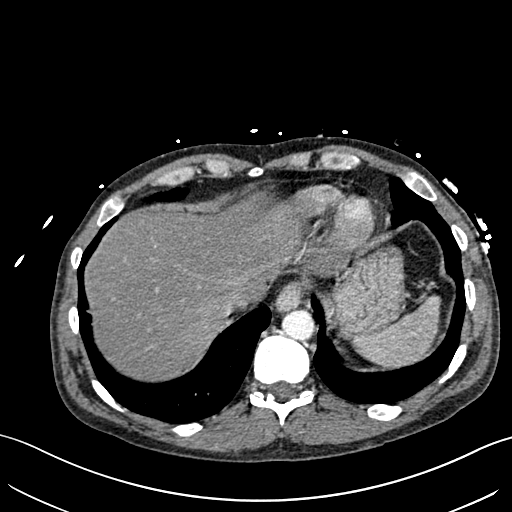
[im 53/282  lung]
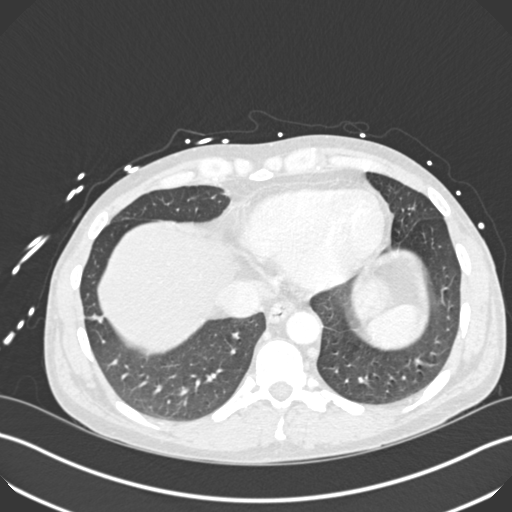
[im 71/282  mediastinal]
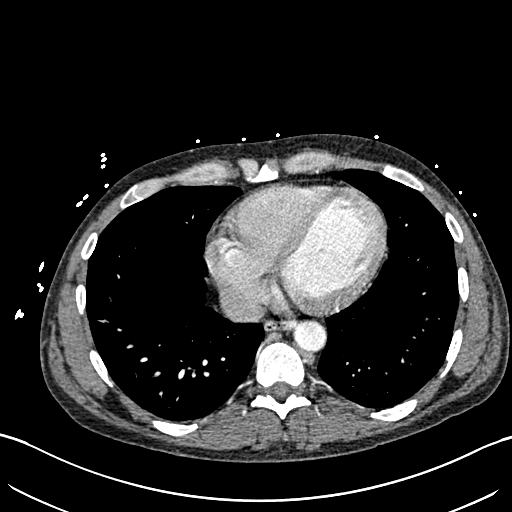
[im 88/282  lung]
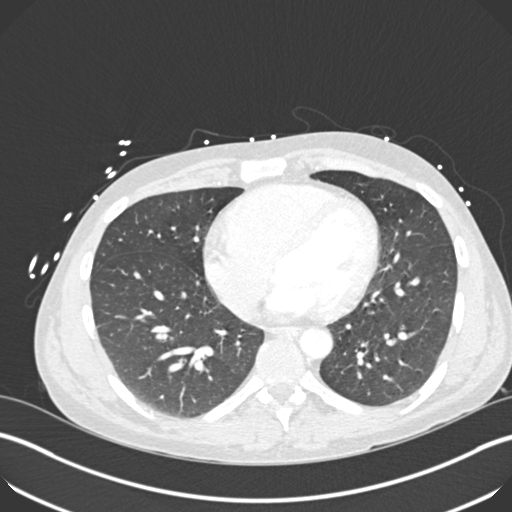
[im 94/282  mediastinal]
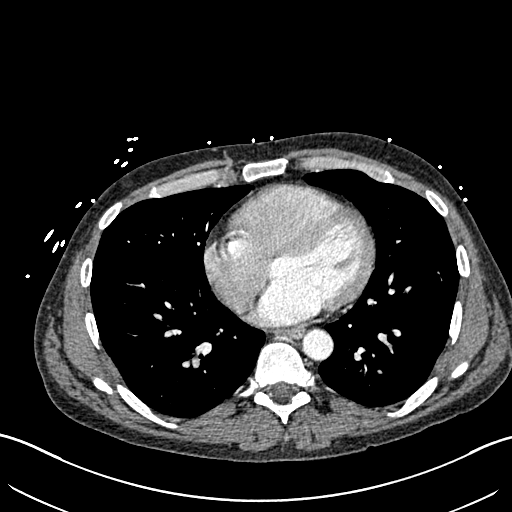
[im 106/282  lung]
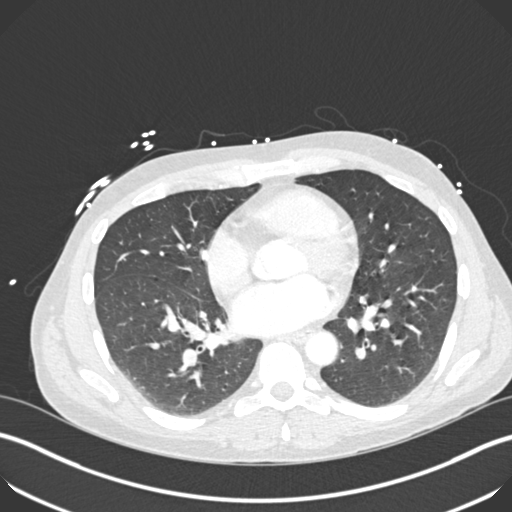
[im 123/282  mediastinal]
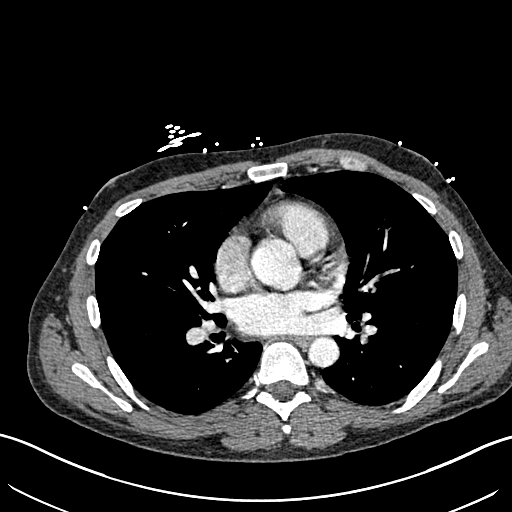
[im 127/282  lung]
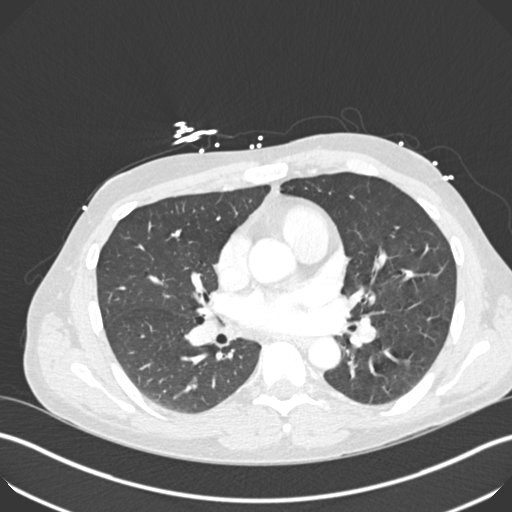
[im 141/282  mediastinal]
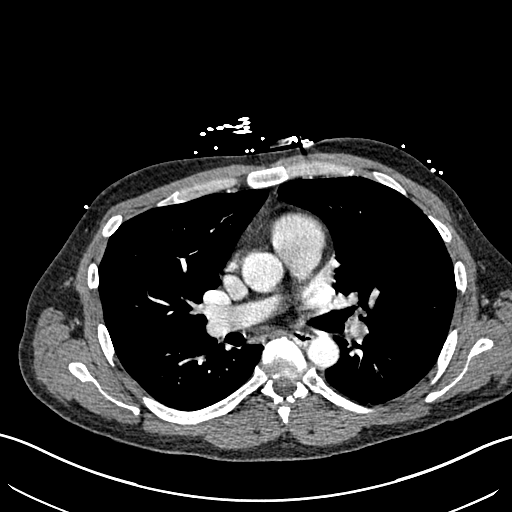
[im 159/282  lung]
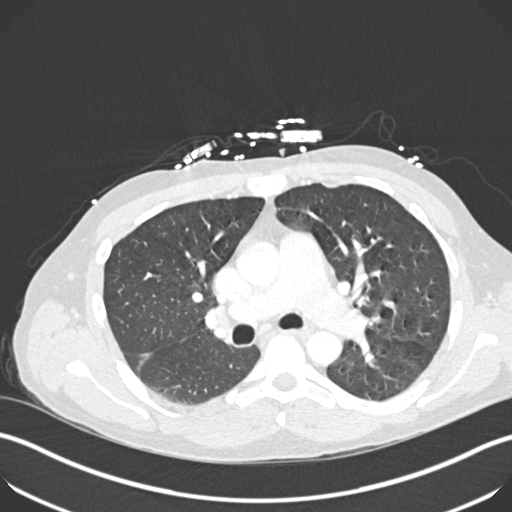
[im 176/282  mediastinal]
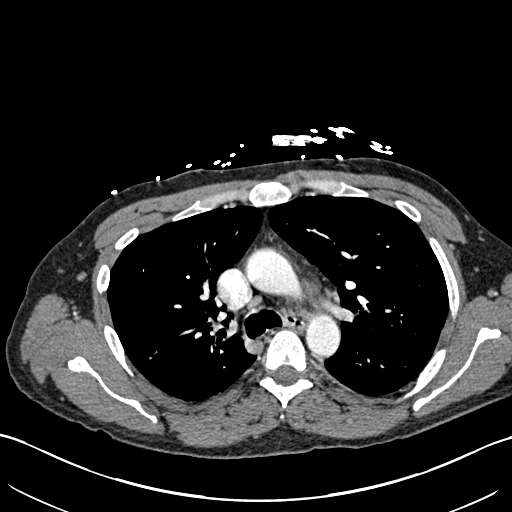
[im 188/282  lung]
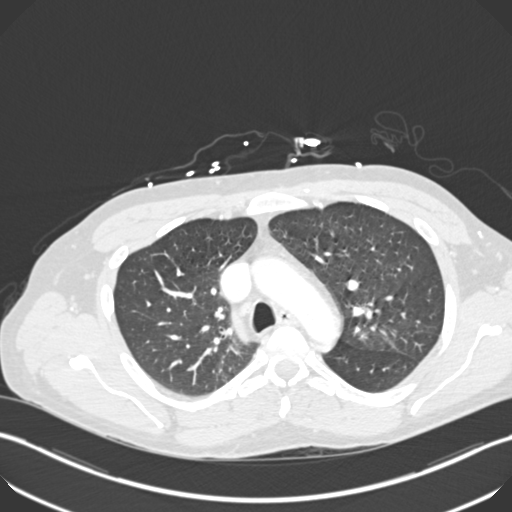
[im 194/282  mediastinal]
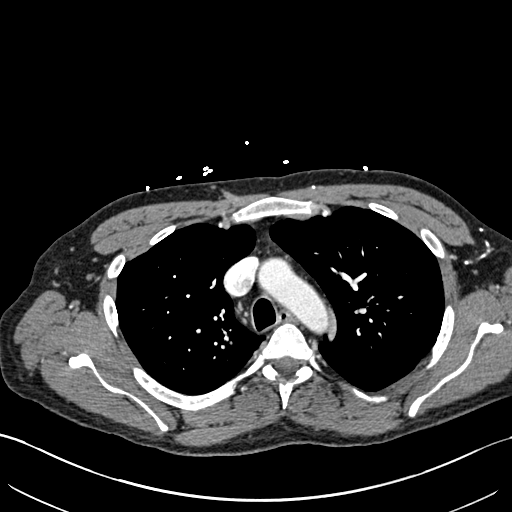
[im 211/282  lung]
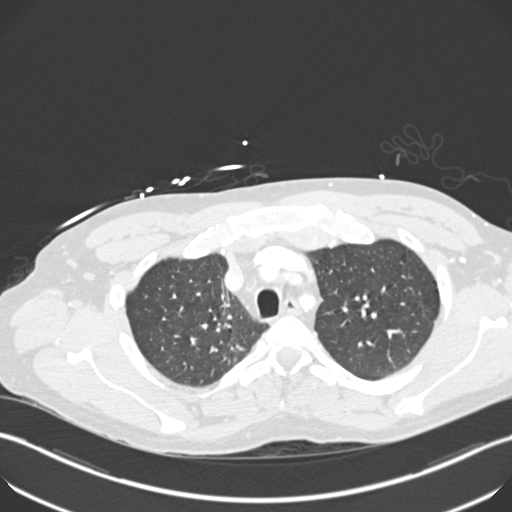
[im 229/282  mediastinal]
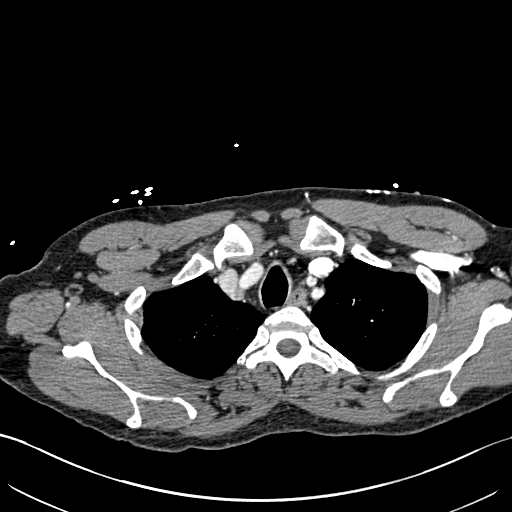
[im 246/282  lung]
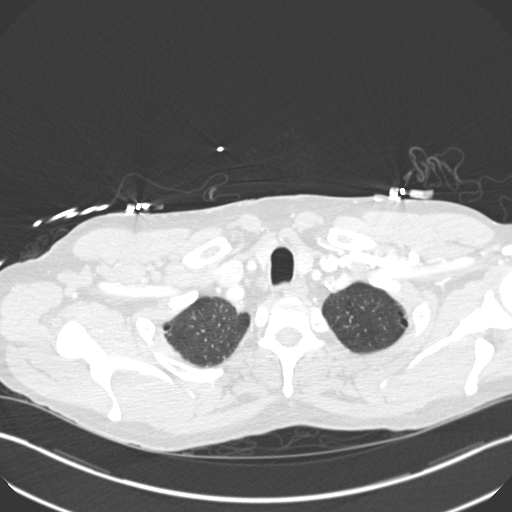
[im 264/282  mediastinal]
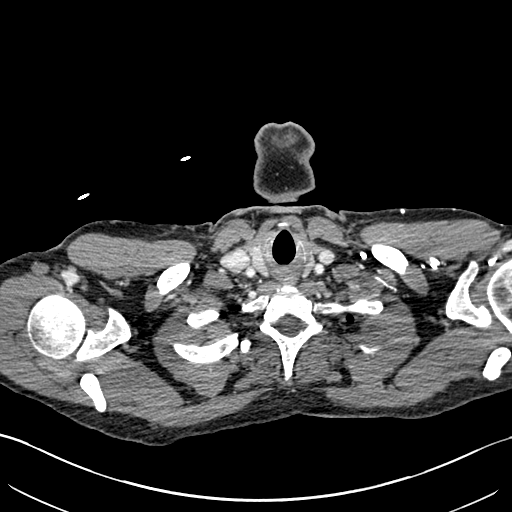

[18 of 33 positions shown; findings below may reference images not displayed]

FINDINGS: Aorta normal caliber without aneurysm or dissection.
No thoracic adenopathy.
Pulmonary arteries suboptimally opacified but grossly patent.
No definite evidence of pulmonary embolism.
Visualized portion of upper abdomen normal appearance.
Minimal linear scarring versus chronic atelectasis lateral right
lower lobe, minimally nodular in appearance but unchanged since
03/01/2011.
Large calcified granuloma superior segment left lower lobe.
Additional scattered chronic lung changes.
No definite pulmonary infiltrate, pleural effusion or pneumothorax.
Osseous structures unremarkable.
IMPRESSION: No definite evidence of pulmonary embolism identified on exam
limited by suboptimal pulmonary arterial opacification, despite
repeating exam.
Few scattered chronic lung changes as above.
No definite acute intrathoracic abnormality identified.

## 2013-08-16 ENCOUNTER — Encounter (HOSPITAL_COMMUNITY): Payer: Self-pay | Admitting: Emergency Medicine

## 2013-08-16 ENCOUNTER — Emergency Department (HOSPITAL_COMMUNITY)
Admission: EM | Admit: 2013-08-16 | Discharge: 2013-08-16 | Disposition: A | Discharge status: Home / Self Care | Payer: Medicaid Other | Attending: Emergency Medicine | Admitting: Emergency Medicine

## 2013-08-16 ENCOUNTER — Ambulatory Visit (HOSPITAL_COMMUNITY)
Admission: RE | Admit: 2013-08-16 | Payer: Federal, State, Local not specified - Other | Source: Home / Self Care | Admitting: Psychiatry

## 2013-08-16 DIAGNOSIS — Z8619 Personal history of other infectious and parasitic diseases: Secondary | ICD-10-CM | POA: Insufficient documentation

## 2013-08-16 DIAGNOSIS — M79609 Pain in unspecified limb: Secondary | ICD-10-CM | POA: Insufficient documentation

## 2013-08-16 DIAGNOSIS — F3289 Other specified depressive episodes: Secondary | ICD-10-CM | POA: Insufficient documentation

## 2013-08-16 DIAGNOSIS — IMO0002 Reserved for concepts with insufficient information to code with codable children: Secondary | ICD-10-CM | POA: Insufficient documentation

## 2013-08-16 DIAGNOSIS — F1021 Alcohol dependence, in remission: Secondary | ICD-10-CM | POA: Insufficient documentation

## 2013-08-16 DIAGNOSIS — Z79899 Other long term (current) drug therapy: Secondary | ICD-10-CM | POA: Insufficient documentation

## 2013-08-16 DIAGNOSIS — I2510 Atherosclerotic heart disease of native coronary artery without angina pectoris: Secondary | ICD-10-CM | POA: Insufficient documentation

## 2013-08-16 DIAGNOSIS — F17200 Nicotine dependence, unspecified, uncomplicated: Secondary | ICD-10-CM | POA: Insufficient documentation

## 2013-08-16 DIAGNOSIS — Z8739 Personal history of other diseases of the musculoskeletal system and connective tissue: Secondary | ICD-10-CM | POA: Insufficient documentation

## 2013-08-16 DIAGNOSIS — Z86718 Personal history of other venous thrombosis and embolism: Secondary | ICD-10-CM | POA: Insufficient documentation

## 2013-08-16 DIAGNOSIS — Z862 Personal history of diseases of the blood and blood-forming organs and certain disorders involving the immune mechanism: Secondary | ICD-10-CM | POA: Insufficient documentation

## 2013-08-16 DIAGNOSIS — Z86711 Personal history of pulmonary embolism: Secondary | ICD-10-CM | POA: Insufficient documentation

## 2013-08-16 DIAGNOSIS — F329 Major depressive disorder, single episode, unspecified: Secondary | ICD-10-CM | POA: Insufficient documentation

## 2013-08-16 DIAGNOSIS — M7989 Other specified soft tissue disorders: Secondary | ICD-10-CM | POA: Insufficient documentation

## 2013-08-16 DIAGNOSIS — F603 Borderline personality disorder: Secondary | ICD-10-CM | POA: Insufficient documentation

## 2013-08-16 NOTE — ED Notes (Signed)
Brought in by EMS from home with c/o left leg pain and redness.  Per EMS, pt reported that he has been having progressive pain to left leg; pt reports that he is taking "Xarelto".

## 2013-08-16 NOTE — ED Notes (Signed)
[employer] and security notified--- at pt's bedside at this time.

## 2013-08-16 NOTE — ED Notes (Signed)
Bed: WA21 Expected date:  Expected time:  Means of arrival:  Comments: EMS 48yo M, leg pain, hx of DVT

## 2013-08-16 NOTE — ED Provider Notes (Signed)
CSN: [id number]     Arrival date & time 08/16/13  1933 History   First MD Initiated Contact with Patient 08/16/13 1945     Chief Complaint  Patient presents with  . Leg Pain   (Consider location/radiation/quality/duration/timing/severity/associated sxs/prior Treatment) HPI  Patient is homeless and presented with complaints of left leg pain. However my PA has researched this chart and he has chronic swelling in this leg. He reports being compliant with the xarelto. When I enter the room patient is sleeping. He awakens slowly. He then became very agitated and hostile and started yelling at me. He sat up and got into my face and started swinging his arms.      Review of Systems     Physical Exam  Nursing note and vitals reviewed. Constitutional: He is oriented to person, place, and time. He appears well-developed and well-nourished.  Non-toxic appearance. He does not appear ill. No distress.  HENT:  Head: Normocephalic and atraumatic.  Right Ear: External ear normal.  Left Ear: External ear normal.  Nose: Nose normal. No mucosal edema or rhinorrhea.  Mouth/Throat: Mucous membranes are normal. No dental abscesses or uvula swelling.  Eyes: Conjunctivae and EOM are normal. Pupils are equal, round, and reactive to light.  Neck: Full passive range of motion without pain.  Pulmonary/Chest: Effort normal. No respiratory distress. He has no rhonchi. He exhibits no crepitus.  Abdominal: Normal appearance.  Musculoskeletal: Normal range of motion. He exhibits no edema and no tenderness.  Moves all extremities well. See pictures below. Has mild redness without warmth of his LLE that appears chronic.   Neurological: He is alert and oriented to person, place, and time. He has normal strength. No cranial nerve deficit.  Skin: Skin is warm, dry and intact. No rash noted. No erythema. No pallor.  Face flushed  Psychiatric: His speech is normal. His affect is angry and labile. He is agitated and  aggressive.           ED Course  Procedures  Pictures taken of his leg for a baseline.    Medical screening examination/treatment/procedure(s) were conducted as a shared visit with non-physician practitioner(s) and myself.  I personally evaluated the patient during the encounter.   Devoria Albe, MD, [facility]     Ward Givens, MD 08/16/13 [phone]

## 2013-08-16 NOTE — ED Provider Notes (Signed)
CSN: [id number]     Arrival date & time 08/16/13  1933 History   First MD Initiated Contact with Patient 08/16/13 1945     Chief Complaint  Patient presents with  . Leg Pain   (Consider location/radiation/quality/duration/timing/severity/associated sxs/prior Treatment) HPI Comments: Patient presents today with increased LLE pain.  He reports that his pain worsened today.  He denies any acute injury or trauma.  He reports that he also has swelling and erythema of the LLE, but states that his leg actually looks better than it usually does.  He states that the swelling is also improved from baseline.  He is currently not taking anything for pain.  Note from his PCP in November was reviewed in the chart and states that the patient has chronic swelling of the LLE and chronic dermatological changes.  Patient has a history of recurrent DVT and PE.  He is currently on Xarelto.  He also had an IVC filter placed in 2011 after the PE.  He reports that he has been compliant with his Xarelto and has not missed any doses.  He denies fever, chills, nausea, or vomiting.  He denies chest pain or SOB.  Patient is a 47 y.o. male presenting with leg pain. The history is provided by the patient.  Leg Pain   Past Medical History  Diagnosis Date  . Coronary artery disease   . Degenerative joint disease of spine   . Pulmonary embolism 06/2010  . Degenerative joint disease   . 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 ETOH abuse   . DVT (deep venous thrombosis)   . Hepatitis B   . Mental disorder   . Depression    Past Surgical History  Procedure Laterality Date  . Left elbow surgery    . Tonsillectomy     Family History  Problem Relation Age of Onset  . Cancer Mother     Ovarian   History  Substance Use Topics  . Smoking status: Current Every Day Smoker -- 2.00 packs/day for 30 years    Types: Cigarettes  . Smokeless tobacco: Never Used  . Alcohol Use: 7.2 oz/week    12 Cans of beer per week   Comment: Drinks a 6-pack of beer daily.    Review of Systems  Musculoskeletal:       LLE pain  All other systems reviewed and are negative.    Allergies  Review of patient's allergies indicates no known allergies.  Home Medications   Current Outpatient Rx  Name  Route  Sig  Dispense  Refill  . nicotine (NICODERM CQ - DOSED IN MG/24 HOURS) 21 mg/24hr patch   Transdermal   Place 1 patch onto the skin daily.   28 patch   0   . Rivaroxaban (XARELTO) 20 MG TABS tablet   Oral   Take 20 mg by mouth daily.         . traZODone (DESYREL) 50 MG tablet   Oral   Take 50 mg by mouth at bedtime as needed for sleep.          BP 123/68  Pulse 90  Temp(Src) 97.5 F (36.4 C) (Oral)  Resp 18  SpO2 96% Physical Exam  Nursing note and vitals reviewed. Constitutional: He appears well-developed and well-nourished.  HENT:  Head: Normocephalic and atraumatic.  Mouth/Throat: Oropharynx is clear and moist.  Neck: Normal range of motion. Neck supple.  Cardiovascular: Normal rate, regular rhythm and normal heart sounds.   Pulses:  Dorsalis pedis pulses are 2+ on the right side, and 2+ on the left side.  Pulmonary/Chest: Effort normal and breath sounds normal. No respiratory distress. He has no wheezes. He has no rales.  Musculoskeletal: Normal range of motion.  LLE with swelling and mild erythema.  No warmth.    Neurological: He is alert.  Distal sensation of left foot intact.  Skin: Skin is warm and dry.  Psychiatric: He has a normal mood and affect.    ED Course  Procedures (including critical care time) Labs Review Labs Reviewed - No data to display Imaging Review No results found.  EKG Interpretation   None       MDM  No diagnosis found. Patient with a history of DVT currently on Xarelto presents today with increased LLE pain.  Review of the chart shows that the patient has chronic lower extremity swelling and dermatologic changes.  Patient denies chest pain or  SOB.  Patient is not hypoxic or tachycardic.  Patient is afebrile.  Patient became agitated during ED course.  Patient discharged home.  Patient instructed to continue taking Xarelto.  Return precautions given.    Santiago Glad, PA-C 08/16/13 2237

## 2013-08-22 NOTE — ED Provider Notes (Signed)
See prior note   Ward Givens, MD 08/22/13 1146

## 2013-11-05 ENCOUNTER — Encounter (HOSPITAL_COMMUNITY): Payer: Self-pay | Admitting: Emergency Medicine

## 2013-11-05 ENCOUNTER — Emergency Department (HOSPITAL_COMMUNITY)
Admission: EM | Admit: 2013-11-05 | Discharge: 2013-11-05 | Disposition: A | Discharge status: Home / Self Care | Payer: Medicaid Other | Attending: Emergency Medicine | Admitting: Emergency Medicine

## 2013-11-05 DIAGNOSIS — Z86711 Personal history of pulmonary embolism: Secondary | ICD-10-CM | POA: Insufficient documentation

## 2013-11-05 DIAGNOSIS — L02419 Cutaneous abscess of limb, unspecified: Secondary | ICD-10-CM | POA: Insufficient documentation

## 2013-11-05 DIAGNOSIS — W010XXA Fall on same level from slipping, tripping and stumbling without subsequent striking against object, initial encounter: Secondary | ICD-10-CM | POA: Insufficient documentation

## 2013-11-05 DIAGNOSIS — Y939 Activity, unspecified: Secondary | ICD-10-CM | POA: Insufficient documentation

## 2013-11-05 DIAGNOSIS — I2510 Atherosclerotic heart disease of native coronary artery without angina pectoris: Secondary | ICD-10-CM | POA: Insufficient documentation

## 2013-11-05 DIAGNOSIS — Y929 Unspecified place or not applicable: Secondary | ICD-10-CM | POA: Insufficient documentation

## 2013-11-05 DIAGNOSIS — M479 Spondylosis, unspecified: Secondary | ICD-10-CM | POA: Insufficient documentation

## 2013-11-05 DIAGNOSIS — S59909A Unspecified injury of unspecified elbow, initial encounter: Secondary | ICD-10-CM | POA: Insufficient documentation

## 2013-11-05 DIAGNOSIS — F3289 Other specified depressive episodes: Secondary | ICD-10-CM | POA: Insufficient documentation

## 2013-11-05 DIAGNOSIS — L03119 Cellulitis of unspecified part of limb: Principal | ICD-10-CM

## 2013-11-05 DIAGNOSIS — F489 Nonpsychotic mental disorder, unspecified: Secondary | ICD-10-CM | POA: Insufficient documentation

## 2013-11-05 DIAGNOSIS — F17200 Nicotine dependence, unspecified, uncomplicated: Secondary | ICD-10-CM | POA: Insufficient documentation

## 2013-11-05 DIAGNOSIS — S6990XA Unspecified injury of unspecified wrist, hand and finger(s), initial encounter: Secondary | ICD-10-CM | POA: Insufficient documentation

## 2013-11-05 DIAGNOSIS — Z86718 Personal history of other venous thrombosis and embolism: Secondary | ICD-10-CM | POA: Insufficient documentation

## 2013-11-05 DIAGNOSIS — S59919A Unspecified injury of unspecified forearm, initial encounter: Secondary | ICD-10-CM

## 2013-11-05 DIAGNOSIS — Z79899 Other long term (current) drug therapy: Secondary | ICD-10-CM | POA: Insufficient documentation

## 2013-11-05 DIAGNOSIS — Z9889 Other specified postprocedural states: Secondary | ICD-10-CM | POA: Insufficient documentation

## 2013-11-05 DIAGNOSIS — Z8619 Personal history of other infectious and parasitic diseases: Secondary | ICD-10-CM | POA: Insufficient documentation

## 2013-11-05 DIAGNOSIS — Z862 Personal history of diseases of the blood and blood-forming organs and certain disorders involving the immune mechanism: Secondary | ICD-10-CM | POA: Insufficient documentation

## 2013-11-05 DIAGNOSIS — F1010 Alcohol abuse, uncomplicated: Secondary | ICD-10-CM | POA: Insufficient documentation

## 2013-11-05 DIAGNOSIS — L03116 Cellulitis of left lower limb: Secondary | ICD-10-CM

## 2013-11-05 DIAGNOSIS — M1990 Unspecified osteoarthritis, unspecified site: Secondary | ICD-10-CM | POA: Insufficient documentation

## 2013-11-05 DIAGNOSIS — F329 Major depressive disorder, single episode, unspecified: Secondary | ICD-10-CM | POA: Insufficient documentation

## 2013-11-05 MED ORDER — CLINDAMYCIN HCL 300 MG PO CAPS
300.0000 mg | ORAL_CAPSULE | Freq: Once | ORAL | Status: AC
  Administered 2013-11-05: 300 mg via ORAL
  Filled 2013-11-05: qty 1

## 2013-11-05 MED ORDER — CLINDAMYCIN HCL 300 MG PO CAPS: 300.0000 mg | ORAL_CAPSULE | Freq: Four times a day (QID) | ORAL | Status: DC

## 2013-11-05 NOTE — ED Notes (Signed)
Police in to speak with patient. Patient refusing to sign

## 2013-11-05 NOTE — Discharge Instructions (Signed)
Please use the antibiotic prescribed to treat an infection in your leg. Continue your Xarelto as instructed. Keep her leg elevated. Have a recheck of your infection in 2 days. Return sooner if you develop any fever, chills or sweats.     Cellulitis Cellulitis is an infection of the skin and the tissue beneath it. The infected area is usually red and tender. Cellulitis occurs most often in the arms and lower legs.  CAUSES  Cellulitis is caused by bacteria that enter the skin through cracks or cuts in the skin. The most common types of bacteria that cause cellulitis are Staphylococcus and Streptococcus. SYMPTOMS   Redness and warmth.  Swelling.  Tenderness or pain.  Fever. DIAGNOSIS  Your caregiver can usually determine what is wrong based on a physical exam. Blood tests may also be done. TREATMENT  Treatment usually involves taking an antibiotic medicine. HOME CARE INSTRUCTIONS   Take your antibiotics as directed. Finish them even if you start to feel better.  Keep the infected arm or leg elevated to reduce swelling.  Apply a warm cloth to the affected area up to 4 times per day to relieve pain.  Only take over-the-counter or prescription medicines for pain, discomfort, or fever as directed by your caregiver.  Keep all follow-up appointments as directed by your caregiver. SEEK MEDICAL CARE IF:   You notice red streaks coming from the infected area.  Your red area gets larger or turns dark in color.  Your bone or joint underneath the infected area becomes painful after the skin has healed.  Your infection returns in the same area or another area.  You notice a swollen bump in the infected area.  You develop new symptoms. SEEK IMMEDIATE MEDICAL CARE IF:   You have a fever.  You feel very sleepy.  You develop vomiting or diarrhea.  You have a general ill feeling (malaise) with muscle aches and pains. MAKE SURE YOU:   Understand these instructions.  Will watch  your condition.  Will get help right away if you are not doing well or get worse. Document Released: 06/03/2005 Document Revised: 02/23/2012 Document Reviewed: 11/09/2011 [facility] Patient Information 2014 [place].

## 2013-11-05 NOTE — ED Notes (Signed)
Per EMS pt comes from [facility] for ETOH, DVT in left leg and left elbow pain from previous fall 3 days ago.

## 2013-11-05 NOTE — ED Notes (Signed)
Patient upset that he is being discharged. States that he can not walk 6 miles across town or his leg will get worse. Now refusing his prescription for his infection in his leg. States that he wants the cops to take him to jail.

## 2013-11-05 NOTE — ED Notes (Signed)
Patient has multiple complaints but is most concerned with his left lower leg. States that he has a DVT and cellulitis. taking xarelto for DVT and not on any antibiotics. Left lower leg is considerably larger than the right. Is hot to the touch, red and blanchable in some areas.. Patient  Also having pain in left shoulder down to elbow--fell on it a few days ago.

## 2013-11-05 NOTE — ED Provider Notes (Signed)
CSN: [id number]     Arrival date & time 11/05/13  1911 History   First MD Initiated Contact with Patient 11/05/13 2140    This chart was scribed for Hazel Sams PA-C, a non-physician practitioner working with Tanna Furry, MD by Denice Bors, ED Scribe. This patient was seen in room [place] and the patient's care was started at 10:28 PM     Chief Complaint  Patient presents with  . Alcohol Intoxication  . dvt left leg   . left elbow pain    The history is provided by the patient. No language interpreter was used.  HPI Comments: [name] is a 48 y.o. 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 Pt states he "has a filter" due to recurrent clots. Reports associated sweats, and subjective fever. Denies any aggravating or alleviating factors. Reports he is still taking Xarelto as prescribed. Denies associated recent illness.    Additionally, reports constant left elbow pain onset from slipping on ice and falling on buttocks 3 days ago. Denies associated head injury, and LOC. Reports having left elbow surgery when he was 48 years of age.    Past Medical History  Diagnosis Date  . Coronary artery disease   . Degenerative joint disease of spine   . Pulmonary embolism 06/2010  . Degenerative joint disease   . 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 ETOH abuse   . DVT (deep venous thrombosis)   . Hepatitis B   . Mental disorder   . Depression    Past Surgical History  Procedure Laterality Date  . Left elbow surgery    . Tonsillectomy     Family History  Problem Relation Age of Onset  . Cancer Mother     Ovarian   History  Substance Use Topics  . Smoking status: Current Every Day Smoker -- 2.00 packs/day for 30 years    Types: Cigarettes  . Smokeless tobacco: Never Used  . Alcohol Use: 7.2 oz/week    12 Cans of beer per week     Comment: Drinks a  6-pack of beer daily.    Review of Systems  A complete 10 system review of systems was obtained and all systems are negative except as noted in the HPI and PMHx.     Allergies  Review of patient's allergies indicates no known allergies.  Home Medications   Current Outpatient Rx  Name  Route  Sig  Dispense  Refill  . ibuprofen (ADVIL,MOTRIN) 200 MG tablet   Oral   Take 400 mg by mouth every 6 (six) hours as needed for moderate pain.         . Rivaroxaban (XARELTO) 20 MG TABS tablet   Oral   Take 20 mg by mouth daily.         . traZODone (DESYREL) 50 MG tablet   Oral   Take 50 mg by mouth at bedtime as needed for sleep.          BP 110/64  Pulse 91  Temp(Src) 98.2 F (36.8 C) (Oral)  Resp 20  SpO2 99% Physical Exam  Nursing note and vitals reviewed. Constitutional: He is oriented to person, place, and time. He appears well-developed and well-nourished. No distress.  HENT:  Head: Normocephalic and atraumatic.  Eyes: EOM are normal.  Neck: Neck supple. No tracheal deviation present.  Cardiovascular: Normal rate.   Pulmonary/Chest: Effort  normal. No respiratory distress.  Musculoskeletal: Normal range of motion.       Left elbow: He exhibits normal range of motion, no swelling, no effusion and no deformity. Tenderness found. Lateral epicondyle tenderness noted.       Left lower leg: He exhibits tenderness and edema.       Legs: Mild TTP of right lateral epicondyle  Left lower leg is diffusely edematous, warm to the touch, and erythematous   Distal pulses intact of left leg. Normal ROM of left leg.  Neurological: He is alert and oriented to person, place, and time.  Skin: Skin is warm and dry.  Psychiatric: He has a normal mood and affect. His behavior is normal.    ED Course  Procedures   COORDINATION OF CARE:  Nursing notes reviewed. Vital signs reviewed. Initial pt interview and examination performed.   10:28 PM-patient seen and evaluated. Patient  has multiple prior visits to the emergency room with similar complaints of left lower extremity pain and swelling. His leg does possibly appear slightly more swollen with some erythema from recent medical chart and pictures in December. He denies significantly large changes. He is warm. There are a few superficial scabs of the skin which may be a possible source for infection. At this time no given prescription for clindamycin for possibilities of saline this. Likely his symptoms are chronic in nature. He is afebrile with normal heart rate and no other signs concerning for infection. He does have a filter in place. No chest pain or shortness of breath. Discussed treatment plan with pt at bedside. Pt agrees with plan.    MDM   Final diagnoses:  Cellulitis of leg, left      I personally performed the services described in this documentation, which was scribed in my presence. The recorded information has been reviewed and is accurate.    Martie Lee, PA-C 11/06/13 [phone]

## 2013-11-14 NOTE — ED Provider Notes (Signed)
Medical screening examination/treatment/procedure(s) were performed by non-physician practitioner and as supervising physician I was immediately available for consultation/collaboration.   EKG Interpretation None        Tanna Furry, MD 11/14/13 [phone]

## 2013-12-01 ENCOUNTER — Telehealth: Payer: Self-pay | Admitting: Hematology and Oncology

## 2013-12-01 NOTE — Telephone Encounter (Signed)
cld pt to adv that appt had been moved per Dr Alvy Bimler from pm to am appt/recvd a recording stating pt does not take -incomong calls-will mail out letter to adv pt of appt chge.

## 2014-01-10 ENCOUNTER — Other Ambulatory Visit: Payer: Self-pay

## 2014-01-10 ENCOUNTER — Ambulatory Visit: Payer: Self-pay | Admitting: Hematology and Oncology

## 2014-01-19 ENCOUNTER — Other Ambulatory Visit: Payer: Self-pay | Admitting: Hematology and Oncology

## 2014-01-19 ENCOUNTER — Telehealth: Payer: Self-pay | Admitting: *Deleted

## 2014-01-19 NOTE — Telephone Encounter (Signed)
Message copied by Cathlean Cower on Fri Jan 19, 2014  1:43 PM ------      Message from: [facility], [place]      Created: Fri Jan 19, 2014 12:45 PM      Regarding: rtn appt       pls get him back      Anytime that works for him ------

## 2014-01-19 NOTE — Telephone Encounter (Signed)
Attempted to reach pt on cell phone [phone] and it is not in service.  Attempted to call emergency contact numbers listed for a friend and those numbers are also out of service.  [facility] and s/w Pharmacist who states pt was just there and left angry because his Patient Assistance has expired from [place] and [place].  Pt could not afford to pay for Xarelto w/o this assistance.  Pharmacist tried to explain to pt about re-applying for the assistance but says pt left angry and did not get Xarelto.   Asked pharmacist to please ask pt to contact us to schedule appt if he talks to him again.  He agreed. Dr. Alvy Bimler aware.

## 2014-01-26 ENCOUNTER — Emergency Department (HOSPITAL_COMMUNITY)
Admission: EM | Admit: 2014-01-26 | Discharge: 2014-01-27 | Disposition: A | Discharge status: Other Acute Inpatient Hospital | Payer: Medicaid Other | Attending: Emergency Medicine | Admitting: Emergency Medicine

## 2014-01-26 ENCOUNTER — Encounter (HOSPITAL_COMMUNITY): Payer: Self-pay | Admitting: Emergency Medicine

## 2014-01-26 DIAGNOSIS — Z8739 Personal history of other diseases of the musculoskeletal system and connective tissue: Secondary | ICD-10-CM | POA: Diagnosis not present

## 2014-01-26 DIAGNOSIS — Z86711 Personal history of pulmonary embolism: Secondary | ICD-10-CM | POA: Insufficient documentation

## 2014-01-26 DIAGNOSIS — F3013 Manic episode, severe, without psychotic symptoms: Secondary | ICD-10-CM

## 2014-01-26 DIAGNOSIS — Z86718 Personal history of other venous thrombosis and embolism: Secondary | ICD-10-CM | POA: Insufficient documentation

## 2014-01-26 DIAGNOSIS — R609 Edema, unspecified: Secondary | ICD-10-CM | POA: Diagnosis not present

## 2014-01-26 DIAGNOSIS — F10239 Alcohol dependence with withdrawal, unspecified: Secondary | ICD-10-CM | POA: Diagnosis not present

## 2014-01-26 DIAGNOSIS — Z7901 Long term (current) use of anticoagulants: Secondary | ICD-10-CM | POA: Diagnosis not present

## 2014-01-26 DIAGNOSIS — I2510 Atherosclerotic heart disease of native coronary artery without angina pectoris: Secondary | ICD-10-CM | POA: Insufficient documentation

## 2014-01-26 DIAGNOSIS — M549 Dorsalgia, unspecified: Secondary | ICD-10-CM | POA: Insufficient documentation

## 2014-01-26 DIAGNOSIS — Z792 Long term (current) use of antibiotics: Secondary | ICD-10-CM | POA: Insufficient documentation

## 2014-01-26 DIAGNOSIS — F10939 Alcohol use, unspecified with withdrawal, unspecified: Secondary | ICD-10-CM | POA: Diagnosis not present

## 2014-01-26 DIAGNOSIS — Z8619 Personal history of other infectious and parasitic diseases: Secondary | ICD-10-CM | POA: Diagnosis not present

## 2014-01-26 DIAGNOSIS — Z760 Encounter for issue of repeat prescription: Secondary | ICD-10-CM | POA: Insufficient documentation

## 2014-01-26 DIAGNOSIS — R45851 Suicidal ideations: Secondary | ICD-10-CM | POA: Insufficient documentation

## 2014-01-26 DIAGNOSIS — F332 Major depressive disorder, recurrent severe without psychotic features: Secondary | ICD-10-CM | POA: Diagnosis present

## 2014-01-26 DIAGNOSIS — F1020 Alcohol dependence, uncomplicated: Secondary | ICD-10-CM | POA: Diagnosis present

## 2014-01-26 DIAGNOSIS — F3113 Bipolar disorder, current episode manic without psychotic features, severe: Secondary | ICD-10-CM | POA: Insufficient documentation

## 2014-01-26 DIAGNOSIS — G8929 Other chronic pain: Secondary | ICD-10-CM | POA: Diagnosis not present

## 2014-01-26 DIAGNOSIS — F1010 Alcohol abuse, uncomplicated: Secondary | ICD-10-CM

## 2014-01-26 DIAGNOSIS — F17200 Nicotine dependence, unspecified, uncomplicated: Secondary | ICD-10-CM | POA: Diagnosis not present

## 2014-01-26 DIAGNOSIS — Z008 Encounter for other general examination: Secondary | ICD-10-CM | POA: Diagnosis present

## 2014-01-26 LAB — CBC
HCT: 41.7 % (ref 39.0–52.0)
HEMOGLOBIN: 14.3 g/dL (ref 13.0–17.0)
MCH: 34.5 pg — ABNORMAL HIGH (ref 26.0–34.0)
MCHC: 34.3 g/dL (ref 30.0–36.0)
MCV: 100.5 fL — ABNORMAL HIGH (ref 78.0–100.0)
Platelets: 209 10*3/uL (ref 150–400)
RBC: 4.15 MIL/uL — ABNORMAL LOW (ref 4.22–5.81)
RDW: 13.9 % (ref 11.5–15.5)
WBC: 7.9 10*3/uL (ref 4.0–10.5)

## 2014-01-26 LAB — COMPREHENSIVE METABOLIC PANEL
ALK PHOS: 82 U/L (ref 39–117)
ALT: 26 U/L (ref 0–53)
AST: 43 U/L — ABNORMAL HIGH (ref 0–37)
Albumin: 3.5 g/dL (ref 3.5–5.2)
BUN: 9 mg/dL (ref 6–23)
CHLORIDE: 101 meq/L (ref 96–112)
CO2: 21 mEq/L (ref 19–32)
Calcium: 8.7 mg/dL (ref 8.4–10.5)
Creatinine, Ser: 0.76 mg/dL (ref 0.50–1.35)
GFR calc Af Amer: >90 mL/min (ref >90)
GFR calc non Af Amer: >90 mL/min (ref >90)
GLUCOSE: 92 mg/dL (ref 70–99)
POTASSIUM: 4 meq/L (ref 3.7–5.3)
Sodium: 140 mEq/L (ref 137–147)
TOTAL PROTEIN: 7.1 g/dL (ref 6.0–8.3)

## 2014-01-26 LAB — ETHANOL: Alcohol, Ethyl (B): 288 mg/dL — ABNORMAL HIGH (ref 0–11)

## 2014-01-26 NOTE — ED Notes (Signed)
Pt belongings locked in locker [address] in [place]. Pt had a coat, shoes, shirt, socks, ebt card in pant pocket, black bag with card board sign, paper and pens. Pt also has cigarettes and phone charger in coat pocket

## 2014-01-26 NOTE — ED Notes (Signed)
Bed: WA16 Expected date:  Expected time:  Means of arrival:  Comments: EMS 

## 2014-01-26 NOTE — ED Notes (Signed)
While assessing patient, pt says that he is having thoughts of suicide and attempted to jump in front of a car earlier today to end his life.

## 2014-01-26 NOTE — ED Notes (Signed)
Pt presents with c/o left leg pain, hx of cellulitis. Pt also needs his medication refilled and needs a doctor to sign his medicare form and would like a bus pass. Pt agitated per EMS.

## 2014-01-26 NOTE — ED Notes (Signed)
MD at bedside. 

## 2014-01-27 ENCOUNTER — Encounter (HOSPITAL_COMMUNITY): Payer: Self-pay | Admitting: Psychiatry

## 2014-01-27 ENCOUNTER — Encounter (HOSPITAL_COMMUNITY): Payer: Self-pay | Admitting: *Deleted

## 2014-01-27 ENCOUNTER — Inpatient Hospital Stay (HOSPITAL_COMMUNITY)
Admission: AD | Admit: 2014-01-27 | Discharge: 2014-02-02 | DRG: 897 | Disposition: A | Discharge status: Home / Self Care | Payer: Federal, State, Local not specified - Other | Source: Intra-hospital | Attending: Psychiatry | Admitting: Psychiatry

## 2014-01-27 DIAGNOSIS — Z5900 Homelessness unspecified: Secondary | ICD-10-CM

## 2014-01-27 DIAGNOSIS — F17200 Nicotine dependence, unspecified, uncomplicated: Secondary | ICD-10-CM | POA: Diagnosis present

## 2014-01-27 DIAGNOSIS — F1010 Alcohol abuse, uncomplicated: Secondary | ICD-10-CM

## 2014-01-27 DIAGNOSIS — I824Y9 Acute embolism and thrombosis of unspecified deep veins of unspecified proximal lower extremity: Secondary | ICD-10-CM

## 2014-01-27 DIAGNOSIS — F332 Major depressive disorder, recurrent severe without psychotic features: Secondary | ICD-10-CM

## 2014-01-27 DIAGNOSIS — F1020 Alcohol dependence, uncomplicated: Principal | ICD-10-CM

## 2014-01-27 DIAGNOSIS — T45511A Poisoning by anticoagulants, accidental (unintentional), initial encounter: Secondary | ICD-10-CM

## 2014-01-27 DIAGNOSIS — D6862 Lupus anticoagulant syndrome: Secondary | ICD-10-CM

## 2014-01-27 DIAGNOSIS — Z5987 Material hardship due to limited financial resources, not elsewhere classified: Secondary | ICD-10-CM

## 2014-01-27 DIAGNOSIS — R45851 Suicidal ideations: Secondary | ICD-10-CM

## 2014-01-27 DIAGNOSIS — I2510 Atherosclerotic heart disease of native coronary artery without angina pectoris: Secondary | ICD-10-CM | POA: Diagnosis present

## 2014-01-27 DIAGNOSIS — Z86718 Personal history of other venous thrombosis and embolism: Secondary | ICD-10-CM

## 2014-01-27 DIAGNOSIS — I87009 Postthrombotic syndrome without complications of unspecified extremity: Secondary | ICD-10-CM

## 2014-01-27 DIAGNOSIS — Z598 Other problems related to housing and economic circumstances: Secondary | ICD-10-CM

## 2014-01-27 DIAGNOSIS — D6859 Other primary thrombophilia: Secondary | ICD-10-CM | POA: Diagnosis present

## 2014-01-27 DIAGNOSIS — Z23 Encounter for immunization: Secondary | ICD-10-CM

## 2014-01-27 DIAGNOSIS — I82403 Acute embolism and thrombosis of unspecified deep veins of lower extremity, bilateral: Secondary | ICD-10-CM

## 2014-01-27 DIAGNOSIS — Z720 Tobacco use: Secondary | ICD-10-CM

## 2014-01-27 DIAGNOSIS — F411 Generalized anxiety disorder: Secondary | ICD-10-CM | POA: Diagnosis present

## 2014-01-27 DIAGNOSIS — F1994 Other psychoactive substance use, unspecified with psychoactive substance-induced mood disorder: Secondary | ICD-10-CM | POA: Diagnosis present

## 2014-01-27 DIAGNOSIS — F10939 Alcohol use, unspecified with withdrawal, unspecified: Secondary | ICD-10-CM

## 2014-01-27 DIAGNOSIS — R319 Hematuria, unspecified: Secondary | ICD-10-CM

## 2014-01-27 DIAGNOSIS — L03116 Cellulitis of left lower limb: Secondary | ICD-10-CM

## 2014-01-27 DIAGNOSIS — I80209 Phlebitis and thrombophlebitis of unspecified deep vessels of unspecified lower extremity: Secondary | ICD-10-CM

## 2014-01-27 DIAGNOSIS — R791 Abnormal coagulation profile: Secondary | ICD-10-CM

## 2014-01-27 DIAGNOSIS — G4700 Insomnia, unspecified: Secondary | ICD-10-CM | POA: Diagnosis present

## 2014-01-27 DIAGNOSIS — D649 Anemia, unspecified: Secondary | ICD-10-CM

## 2014-01-27 DIAGNOSIS — M1990 Unspecified osteoarthritis, unspecified site: Secondary | ICD-10-CM

## 2014-01-27 DIAGNOSIS — I82409 Acute embolism and thrombosis of unspecified deep veins of unspecified lower extremity: Secondary | ICD-10-CM

## 2014-01-27 DIAGNOSIS — I82509 Chronic embolism and thrombosis of unspecified deep veins of unspecified lower extremity: Secondary | ICD-10-CM | POA: Diagnosis present

## 2014-01-27 DIAGNOSIS — F10239 Alcohol dependence with withdrawal, unspecified: Secondary | ICD-10-CM

## 2014-01-27 DIAGNOSIS — Z86711 Personal history of pulmonary embolism: Secondary | ICD-10-CM

## 2014-01-27 LAB — RAPID URINE DRUG SCREEN, HOSP PERFORMED
AMPHETAMINES: NOT DETECTED
BENZODIAZEPINES: NOT DETECTED
Barbiturates: NOT DETECTED
Cocaine: NOT DETECTED
Opiates: NOT DETECTED
Tetrahydrocannabinol: NOT DETECTED

## 2014-01-27 MED ORDER — HYDROXYZINE HCL 25 MG PO TABS
25.0000 mg | ORAL_TABLET | Freq: Four times a day (QID) | ORAL | Status: AC | PRN
  Administered 2014-01-27 – 2014-01-29 (×3): 25 mg via ORAL
  Filled 2014-01-27 (×3): qty 1

## 2014-01-27 MED ORDER — CLONIDINE HCL 0.1 MG PO TABS: 0.1000 mg | ORAL_TABLET | Freq: Two times a day (BID) | ORAL | Status: DC | PRN

## 2014-01-27 MED ORDER — CHLORDIAZEPOXIDE HCL 25 MG PO CAPS
25.0000 mg | ORAL_CAPSULE | Freq: Once | ORAL | Status: AC
  Administered 2014-01-27: 25 mg via ORAL
  Filled 2014-01-27: qty 1

## 2014-01-27 MED ORDER — LORAZEPAM 2 MG/ML IJ SOLN
2.0000 mg | Freq: Once | INTRAMUSCULAR | Status: AC
  Administered 2014-01-27: 2 mg via INTRAMUSCULAR
  Filled 2014-01-27: qty 1

## 2014-01-27 MED ORDER — CHLORDIAZEPOXIDE HCL 25 MG PO CAPS
25.0000 mg | ORAL_CAPSULE | Freq: Four times a day (QID) | ORAL | Status: AC | PRN
  Administered 2014-01-28: 25 mg via ORAL
  Filled 2014-01-27 (×2): qty 1

## 2014-01-27 MED ORDER — LORAZEPAM 1 MG PO TABS: 1.0000 mg | ORAL_TABLET | Freq: Three times a day (TID) | ORAL | Status: DC | PRN

## 2014-01-27 MED ORDER — THIAMINE HCL 100 MG/ML IJ SOLN: 100.0000 mg | Freq: Every day | INTRAMUSCULAR | Status: DC

## 2014-01-27 MED ORDER — CHLORDIAZEPOXIDE HCL 25 MG PO CAPS
25.0000 mg | ORAL_CAPSULE | Freq: Every day | ORAL | Status: AC
  Administered 2014-02-01: 25 mg via ORAL
  Filled 2014-01-27: qty 1

## 2014-01-27 MED ORDER — ACETAMINOPHEN 325 MG PO TABS
650.0000 mg | ORAL_TABLET | ORAL | Status: DC | PRN
  Filled 2014-01-27: qty 2

## 2014-01-27 MED ORDER — RIVAROXABAN 20 MG PO TABS: 20.0000 mg | ORAL_TABLET | Freq: Every day | ORAL | Status: DC

## 2014-01-27 MED ORDER — ACETAMINOPHEN 325 MG PO TABS
650.0000 mg | ORAL_TABLET | ORAL | Status: DC | PRN
  Administered 2014-01-31 – 2014-02-02 (×4): 650 mg via ORAL
  Filled 2014-01-27 (×4): qty 2

## 2014-01-27 MED ORDER — CHLORDIAZEPOXIDE HCL 25 MG PO CAPS
25.0000 mg | ORAL_CAPSULE | ORAL | Status: AC
  Administered 2014-01-30 – 2014-01-31 (×2): 25 mg via ORAL
  Filled 2014-01-27 (×2): qty 1

## 2014-01-27 MED ORDER — CEPHALEXIN 500 MG PO CAPS
500.0000 mg | ORAL_CAPSULE | Freq: Three times a day (TID) | ORAL | Status: DC
  Administered 2014-01-27 – 2014-02-02 (×18): 500 mg via ORAL
  Filled 2014-01-27 (×11): qty 1
  Filled 2014-01-27: qty 42
  Filled 2014-01-27 (×2): qty 1
  Filled 2014-01-27 (×2): qty 42
  Filled 2014-01-27: qty 2
  Filled 2014-01-27 (×5): qty 1
  Filled 2014-01-27: qty 2
  Filled 2014-01-27 (×2): qty 1

## 2014-01-27 MED ORDER — RIVAROXABAN 20 MG PO TABS
20.0000 mg | ORAL_TABLET | Freq: Once | ORAL | Status: AC
  Administered 2014-01-27: 20 mg via ORAL
  Filled 2014-01-27: qty 1

## 2014-01-27 MED ORDER — ALUM & MAG HYDROXIDE-SIMETH 200-200-20 MG/5ML PO SUSP: 30.0000 mL | ORAL | Status: DC | PRN

## 2014-01-27 MED ORDER — CLONIDINE HCL 0.1 MG PO TABS
0.1000 mg | ORAL_TABLET | Freq: Two times a day (BID) | ORAL | Status: DC | PRN
  Administered 2014-01-27: 0.1 mg via ORAL
  Filled 2014-01-27: qty 1

## 2014-01-27 MED ORDER — RIVAROXABAN 20 MG PO TABS
20.0000 mg | ORAL_TABLET | Freq: Every day | ORAL | Status: DC
  Administered 2014-01-28 – 2014-02-02 (×6): 20 mg via ORAL
  Filled 2014-01-27: qty 8
  Filled 2014-01-27 (×7): qty 1

## 2014-01-27 MED ORDER — CHLORDIAZEPOXIDE HCL 25 MG PO CAPS
25.0000 mg | ORAL_CAPSULE | Freq: Four times a day (QID) | ORAL | Status: AC
  Administered 2014-01-27 – 2014-01-29 (×6): 25 mg via ORAL
  Filled 2014-01-27 (×6): qty 1

## 2014-01-27 MED ORDER — LORAZEPAM 1 MG PO TABS: 0.0000 mg | ORAL_TABLET | Freq: Two times a day (BID) | ORAL | Status: DC

## 2014-01-27 MED ORDER — LORAZEPAM 1 MG PO TABS
0.0000 mg | ORAL_TABLET | Freq: Four times a day (QID) | ORAL | Status: DC
  Administered 2014-01-27: 1 mg via ORAL
  Administered 2014-01-27: 2 mg via ORAL
  Filled 2014-01-27: qty 1
  Filled 2014-01-27: qty 2

## 2014-01-27 MED ORDER — HALOPERIDOL LACTATE 5 MG/ML IJ SOLN: 5.0000 mg | Freq: Once | INTRAMUSCULAR | Status: DC

## 2014-01-27 MED ORDER — MAGNESIUM HYDROXIDE 400 MG/5ML PO SUSP: 30.0000 mL | Freq: Every day | ORAL | Status: DC | PRN

## 2014-01-27 MED ORDER — VITAMIN B-1 100 MG PO TABS
100.0000 mg | ORAL_TABLET | Freq: Every day | ORAL | Status: DC
  Administered 2014-01-27: 100 mg via ORAL
  Filled 2014-01-27: qty 1

## 2014-01-27 MED ORDER — ONDANSETRON 4 MG PO TBDP: 4.0000 mg | ORAL_TABLET | Freq: Four times a day (QID) | ORAL | Status: AC | PRN

## 2014-01-27 MED ORDER — LORAZEPAM 1 MG PO TABS
2.0000 mg | ORAL_TABLET | Freq: Once | ORAL | Status: AC
  Administered 2014-01-27: 2 mg via ORAL
  Filled 2014-01-27: qty 2

## 2014-01-27 MED ORDER — ADULT MULTIVITAMIN W/MINERALS CH
1.0000 | ORAL_TABLET | Freq: Every day | ORAL | Status: DC
  Administered 2014-01-28 – 2014-02-02 (×6): 1 via ORAL
  Filled 2014-01-27 (×8): qty 1

## 2014-01-27 MED ORDER — CHLORDIAZEPOXIDE HCL 25 MG PO CAPS
25.0000 mg | ORAL_CAPSULE | Freq: Once | ORAL | Status: AC
  Administered 2014-01-27: 25 mg via ORAL

## 2014-01-27 MED ORDER — LOPERAMIDE HCL 2 MG PO CAPS
2.0000 mg | ORAL_CAPSULE | ORAL | Status: AC | PRN
  Administered 2014-01-28: 4 mg via ORAL
  Filled 2014-01-27: qty 2

## 2014-01-27 MED ORDER — CLONIDINE HCL 0.1 MG PO TABS
0.1000 mg | ORAL_TABLET | Freq: Once | ORAL | Status: AC
  Administered 2014-01-27: 0.1 mg via ORAL
  Filled 2014-01-27: qty 1

## 2014-01-27 MED ORDER — VITAMIN B-1 100 MG PO TABS
100.0000 mg | ORAL_TABLET | Freq: Every day | ORAL | Status: DC
  Administered 2014-01-28 – 2014-02-02 (×6): 100 mg via ORAL
  Filled 2014-01-27 (×8): qty 1

## 2014-01-27 MED ORDER — CHLORDIAZEPOXIDE HCL 25 MG PO CAPS
25.0000 mg | ORAL_CAPSULE | Freq: Three times a day (TID) | ORAL | Status: AC
  Administered 2014-01-29 – 2014-01-30 (×3): 25 mg via ORAL
  Filled 2014-01-27 (×3): qty 1

## 2014-01-27 NOTE — ED Provider Notes (Signed)
CSN: [id number]     Arrival date & time 01/26/14  2205 History   First MD Initiated Contact with Patient 01/26/14 2259     Chief Complaint  Patient presents with  . Medical Clearance  . Leg Pain  . Medication Refill     (Consider location/radiation/quality/duration/timing/severity/associated sxs/prior Treatment) HPI Comments: 48 year old male with lupus anticoagulant disorder, alcohol abuse, tobacco abuse, degenerative joint disease, DVT bilateral, major depressive disorder presents with suicidal ideation. Patient has had gradually worsening thoughts of hurting himself with plan to jump in front of a vehicle. Patient has had these thoughts before and has been on medication in the past but not currently on any medicines for psychiatric care. Patient does drink alcohol fairly regularly and had alcoholic beverages this evening. Patient has chronic leg swelling worse on the left and chronic shortness of breath from PE and DVT history. Patient has been on xarelto however has not able to afford his medications for the past 3-4 days. He's very frustrated with his medical condition and not be up to afford his medicines and this has contributed to his worsening suicidal ideation.  Patient is a 48 y.o. male presenting with leg pain. The history is provided by the patient.  Leg Pain Associated symptoms: back pain (chronic)   Associated symptoms: no fever and no neck pain     Past Medical History  Diagnosis Date  . Coronary artery disease   . Degenerative joint disease of spine   . Pulmonary embolism 06/2010  . Degenerative joint disease   . 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 ETOH abuse   . DVT (deep venous thrombosis)   . Hepatitis B   . Mental disorder   . Depression    Past Surgical History  Procedure Laterality Date  . Left elbow surgery    . Tonsillectomy     Family History  Problem Relation Age of Onset  . Cancer Mother     Ovarian   History  Substance Use Topics  .  Smoking status: Current Every Day Smoker -- 2.00 packs/day for 30 years    Types: Cigarettes  . Smokeless tobacco: Never Used  . Alcohol Use: 7.2 oz/week    12 Cans of beer per week     Comment: Drinks a 6-pack of beer daily.    Review of Systems  Constitutional: Negative for fever and chills.  HENT: Negative for congestion.   Eyes: Negative for visual disturbance.  Respiratory: Positive for shortness of breath.   Cardiovascular: Positive for leg swelling ( chronic). Negative for chest pain.  Gastrointestinal: Negative for vomiting and abdominal pain.  Genitourinary: Negative for dysuria and flank pain.  Musculoskeletal: Positive for back pain (chronic). Negative for neck pain and neck stiffness.  Skin: Negative for rash.  Neurological: Negative for light-headedness and headaches. Dizziness: chronic.  Psychiatric/Behavioral: Positive for suicidal ideas.      Allergies  Review of patient's allergies indicates no known allergies.  Home Medications   Prior to Admission medications   Medication Sig Start Date End Date Taking? Authorizing Provider  clindamycin (CLEOCIN) 300 MG capsule Take 1 capsule (300 mg total) by mouth 4 (four) times daily. X 7 days 11/05/13   Ruthell Rummage Dammen, PA-C  ibuprofen (ADVIL,MOTRIN) 200 MG tablet Take 400 mg by mouth every 6 (six) hours as needed for moderate pain.    Historical Provider, MD  traZODone (DESYREL) 50 MG tablet Take 50 mg by mouth at bedtime as needed for sleep. 06/14/13   Janece Canterbury,  MD  XARELTO 20 MG TABS tablet TAKE 1 TABLET BY MOUTH DAILY 01/19/14   Heath Lark, MD   BP 111/61  Pulse 90  Temp(Src) 98.7 F (37.1 C) (Oral)  Resp 20  SpO2 97% Physical Exam  Nursing note and vitals reviewed. Constitutional: He is oriented to person, place, and time. He appears well-developed and well-nourished.  HENT:  Head: Normocephalic and atraumatic.  Eyes: Conjunctivae are normal. Right eye exhibits no discharge. Left eye exhibits no discharge.   Neck: Normal range of motion. Neck supple. No tracheal deviation present.  Cardiovascular: Normal rate and regular rhythm.   Pulmonary/Chest: Effort normal and breath sounds normal.  Abdominal: Soft. He exhibits no distension. There is no tenderness. There is no guarding.  Musculoskeletal: He exhibits edema and tenderness.  Patient has 2+ leg edema on left side and mild leg swelling on the right lower extremity. Neurovascularly intact lower extremities bilateral. Mild diffuse pain to palpation in the left lower extremity. Normal Refill.  Neurological: He is alert and oriented to person, place, and time.  Skin: Skin is warm. No rash noted.  Psychiatric: His mood appears not anxious. His speech is not rapid and/or pressured. He is not agitated and not slowed. He expresses suicidal ideation. He expresses no homicidal ideation. He expresses suicidal plans. He expresses no homicidal plans.  Mild clinical intoxication    ED Course  Procedures (including critical care time) Labs Review Labs Reviewed  CBC - Abnormal; Notable for the following:    RBC 4.15 (*)    MCV 100.5 (*)    MCH 34.5 (*)    All other components within normal limits  COMPREHENSIVE METABOLIC PANEL - Abnormal; Notable for the following:    AST 43 (*)    Total Bilirubin <0.2 (*)    All other components within normal limits  ETHANOL - Abnormal; Notable for the following:    Alcohol, Ethyl (B) 288 (*)    All other components within normal limits  URINE RAPID DRUG SCREEN (HOSP PERFORMED)    Imaging Review No results found.   EKG Interpretation None      MDM   Final diagnoses:  Alcohol dependence  Alcohol abuse  Alcohol withdrawal  Major depressive disorder, recurrent episode, severe, without mention of psychotic behavior   Patient very cooperative and admits that he needs help for suicidal ideation and difficulties with financial ability to afford medications and his medical history. He is stressed that if he  does not obtain his medications he will require further procedures for his blood clotting history. Patient has no new or worsening symptoms of blood clots.  xarelto ordered in ED. No acute issues in the ER.  Plan for social work consult in the morning. Plan for behavior health assessment in the morning once clinically sober.   Left leg swelling, suicidal ideation   Mariea Clonts, MD 01/29/14 [phone]

## 2014-01-27 NOTE — BH Assessment (Signed)
Pt accepted to 504/2 to Dr. Louretta Shorten, but can not come until confirmed with unit for bed availability.  Likely not until 7:30 pm.    Regan Lemming, LCSW 01/27/2014  6:06 PM

## 2014-01-27 NOTE — BH Assessment (Signed)
Assessment Note  [name] is an 48 y.o. male.  -Dr Reather Converse requested TTS consult because of patient suicidality.  Patient has DVT in left leg and other health problems which is making him want to kill himself.  Patient also drinks heavily.  Has plan to step in front of a car to get run over.  Patient said that he has been without his DVT medication for the last 2-3 days.  He was told that his MCD had lapsed and it may take 90 days to get it reinstated.  He said that he is in such pain and that without medication or surgery he will die "a miserable death, it makes sense for me to kill myself rather than suffer."  Patient endorses plan to jump in front of a vehicle to kill self.  Patient also consumes "at least a case of beer" daily and some liquor to top it off.  Has been drinking at this rate for over a year.  Patient drank prior to arrival.  He denies use of other drugs.  Smokes about two packs of cigarettes daily.  Patient denies any HI or A/V hallucinations at this time.  He does not have any outpatient care.  Last inpatient psych care was February of 2014.  -Pt meets inpatient care criteria per Serena Colonel, NP.  There are no beds at [facility] however, placement should be sought elsewhere.  Axis I: Depressive Disorder NOS and Substance Abuse Axis II: Deferred Axis III:  Past Medical History  Diagnosis Date  . Coronary artery disease   . Degenerative joint disease of spine   . Pulmonary embolism 06/2010  . Degenerative joint disease   . 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 ETOH abuse   . DVT (deep venous thrombosis)   . Hepatitis B   . Mental disorder   . Depression    Axis IV: economic problems, housing problems, occupational problems, other psychosocial or environmental problems, problems with access to health care services and problems with primary support group Axis V: 31-40 impairment in reality testing  Past Medical History:  Past Medical History  Diagnosis Date  .  Coronary artery disease   . Degenerative joint disease of spine   . Pulmonary embolism 06/2010  . Degenerative joint disease   . 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 ETOH abuse   . DVT (deep venous thrombosis)   . Hepatitis B   . Mental disorder   . Depression     Past Surgical History  Procedure Laterality Date  . Left elbow surgery    . Tonsillectomy      Family History:  Family History  Problem Relation Age of Onset  . Cancer Mother     Ovarian    Social History:  reports that he has been smoking Cigarettes.  He has a 60 pack-year smoking history. He has never used smokeless tobacco. He reports that he drinks about 7.2 ounces of alcohol per week. He reports that he does not use illicit drugs.  Additional Social History:  Alcohol / Drug Use Pain Medications: See PTA medication list.   Prescriptions: See PTA medication list.  Has been without trazadone & tramadol for a few months.  Without DVT medicationfor 2-3 days Over the Counter: N/A History of alcohol / drug use?: Yes Withdrawal Symptoms: Blackouts;Cramps;Diarrhea;Sweats;Tremors;Weakness;Patient aware of relationship between substance abuse and physical/medical complications;Nausea / Vomiting;Fever / Chills Substance #1 Name of Substance 1: ETOH (usually beer) 1 - Age of First Use: 48 years of age  1 - Amount (size/oz): Pt reports drinking "at least a case of beer daily." 1 - Frequency: Daily consumption 1 - Duration: Over a year at that rate 1 - Last Use / Amount: 05/22  One case  CIWA: CIWA-Ar BP: 126/76 mmHg Pulse Rate: 95 Nausea and Vomiting: mild nausea with no vomiting Tactile Disturbances: none Tremor: moderate, with patient's arms extended Auditory Disturbances: not present Paroxysmal Sweats: no sweat visible Visual Disturbances: mild sensitivity Anxiety: mildly anxious Headache, Fullness in Head: moderate Agitation: two Orientation and Clouding of Sensorium: oriented and can do serial  additions CIWA-Ar Total: 13 COWS:    Allergies: No Known Allergies  Home Medications:  (Not in a hospital admission)  OB/GYN Status:  No LMP for male patient.  General Assessment Data Location of Assessment: [facility] ED Is this a Tele or Face-to-Face Assessment?: Face-to-Face Is this an Initial Assessment or a Re-assessment for this encounter?: Initial Assessment Living Arrangements: Other (Comment) (Pt is homeless) Can pt return to current living arrangement?: Yes Admission Status: Voluntary Is patient capable of signing voluntary admission?: Yes Transfer from: [facility] Referral Source: Self/Family/Friend     [place] Living Arrangements: Other (Comment) (Pt is homeless) Name of Psychiatrist: None Name of Therapist: N/A     Risk to self Suicidal Ideation: Yes-Currently Present Suicidal Intent: Yes-Currently Present Is patient at risk for suicide?: Yes Suicidal Plan?: Yes-Currently Present Specify Current Suicidal Plan: Step in front of a car Access to Means: No What has been your use of drugs/alcohol within the last 12 months?: ETOH use daily Previous Attempts/Gestures: Yes How many times?:  (Unknown) Other Self Harm Risks: SA issues Triggers for Past Attempts: Unpredictable Intentional Self Injurious Behavior: None Family Suicide History: Unknown Recent stressful life event(s): Other (Comment) (Health problems) Persecutory voices/beliefs?: No Depression: Yes Depression Symptoms: Despondent;Insomnia;Isolating;Fatigue;Loss of interest in usual pleasures;Feeling worthless/self pity Substance abuse history and/or treatment for substance abuse?: Yes Suicide prevention information given to non-admitted patients: Not applicable  Risk to Others Homicidal Ideation: No Thoughts of Harm to Others: No Current Homicidal Intent: No Current Homicidal Plan: No Access to Homicidal Means: No Identified Victim: No one History of harm to others?: No Assessment of  Violence: None Noted Violent Behavior Description: None reported Does patient have access to weapons?: No Criminal Charges Pending?: No Does patient have a court date: No  Psychosis Hallucinations: None noted Delusions: None noted  Mental Status Report Appear/Hygiene: Disheveled;Body odor;Poor hygiene Eye Contact: Poor Motor Activity: Freedom of movement Speech: Logical/coherent Level of Consciousness: Quiet/awake Mood: Depressed;Despair;Empty;Helpless;Sad;Worthless, low self-esteem Affect: Blunted;Depressed;Sad Anxiety Level: Moderate Thought Processes: Coherent;Relevant Judgement: Impaired Orientation: Person;Place;Situation Obsessive Compulsive Thoughts/Behaviors: None  Cognitive Functioning Concentration: Decreased Memory: Remote Intact;Recent Impaired IQ: Average Insight: Good Impulse Control: Poor Appetite: Poor Weight Loss:  (Unknown) Weight Gain: 0 Sleep: Decreased Total Hours of Sleep:  (<4H/D) Vegetative Symptoms: Not bathing  ADLScreening [facility] Assessment Services) Patient's cognitive ability adequate to safely complete daily activities?: Yes Patient able to express need for assistance with ADLs?: Yes Independently performs ADLs?: Yes (appropriate for developmental age)  Prior Inpatient Therapy Prior Inpatient Therapy: Yes Prior Therapy Dates: Feb '14 Prior Therapy Facilty/Provider(s): [facility] Reason for Treatment: depression  Prior Outpatient Therapy Prior Outpatient Therapy: No Prior Therapy Dates: N/A Prior Therapy Facilty/Provider(s): N/A Reason for Treatment: N/A  ADL Screening (condition at time of admission) Patient's cognitive ability adequate to safely complete daily activities?: Yes Is the patient deaf or have difficulty hearing?: No Does the patient have difficulty seeing, even when wearing  glasses/contacts?: No Does the patient have difficulty concentrating, remembering, or making decisions?: Yes Patient able to express need for assistance  with ADLs?: Yes Does the patient have difficulty dressing or bathing?: No Independently performs ADLs?: Yes (appropriate for developmental age) Does the patient have difficulty walking or climbing stairs?: Yes (Left leg DVT.  Moves slowly.) Weakness of Legs: Left Weakness of Arms/Hands: None       Abuse/Neglect Assessment (Assessment to be complete while patient is alone) Physical Abuse: Denies Verbal Abuse: Denies Sexual Abuse: Denies Exploitation of patient/patient's resources: Denies Self-Neglect: Denies Values / Beliefs Cultural Requests During Hospitalization: None Spiritual Requests During Hospitalization: None   Advance Directives (For Healthcare) Advance Directive: Patient does not have advance directive;Patient would not like information Pre-existing out of facility DNR order (yellow form or pink MOST form): No    Additional Information 1:1 In Past 12 Months?: No CIRT Risk: No Elopement Risk: No Does patient have medical clearance?: Yes     Disposition:  Disposition Initial Assessment Completed for this Encounter: Yes Disposition of Patient: Inpatient treatment program;Referred to Type of inpatient treatment program: Adult Patient referred to:  (Pt meets inpatient criteria per Serena Colonel, NP.  No beds.)  On Site Evaluation by:   Reviewed with Physician:    Tera Helper 01/27/2014 7:33 AM

## 2014-01-27 NOTE — Tx Team (Signed)
Initial Interdisciplinary Treatment Plan  PATIENT STRENGTHS: (choose at least two) Ability for insight Average or above average intelligence General fund of knowledge  PATIENT STRESSORS: Substance abuse   PROBLEM LIST: Problem List/Patient Goals Date to be addressed Date deferred Reason deferred Estimated date of resolution  ETOH abuse 01/27/14     Depression 01/27/14                                                DISCHARGE CRITERIA:  Ability to meet basic life and health needs Improved stabilization in mood, thinking, and/or behavior Verbal commitment to aftercare and medication compliance Withdrawal symptoms are absent or subacute and managed without 24-hour nursing intervention  PRELIMINARY DISCHARGE PLAN: Placement in alternative living arrangements  PATIENT/FAMIILY INVOLVEMENT: This treatment plan has been presented to and reviewed with the patient, [name], and/or family member, .  The patient and family have been given the opportunity to ask questions and make suggestions.  [place] 01/27/2014, 8:55 PM

## 2014-01-27 NOTE — Consult Note (Signed)
[facility] Face-to-Face Psychiatry Consult   Reason for Consult:  Alcohol detox/dependency Referring Physician:  EDP  [name] is an 48 y.o. male. Total Time spent with patient: 20 minutes  Assessment: AXIS I:  Alcohol Abuse and Major Depression, Recurrent severe AXIS II:  Deferred AXIS III:   Past Medical History  Diagnosis Date  . Coronary artery disease   . Degenerative joint disease of spine   . Pulmonary embolism 06/2010  . Degenerative joint disease   . 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 ETOH abuse   . DVT (deep venous thrombosis)   . Hepatitis B   . Mental disorder   . Depression    AXIS IV:  economic problems, housing problems, other psychosocial or environmental problems, problems related to social environment and problems with primary support group AXIS V:  21-30 behavior considerably influenced by delusions or hallucinations OR serious impairment in judgment, communication OR inability to function in almost all areas  Plan:  Recommend psychiatric Inpatient admission when medically cleared.  Dr. Louretta Shorten assessed the patient and concurs with the plan.  Subjective:   [name] is a 49 y.o. male patient admitted with alcohol detox/dependence.  HPI:  48 y.o. male.  -Dr Reather Converse requested TTS consult because of patient suicidality. Patient has DVT in left leg and other health problems which is making him want to kill himself. Patient also drinks heavily. Has plan to step in front of a car to get run over.  Patient said that he has been without his DVT medication for the last 2-3 days. He was told that his MCD had lapsed and it may take 90 days to get it reinstated. He said that he is in such pain and that without medication or surgery he will die "a miserable death, it makes sense for me to kill myself rather than suffer." Patient endorses plan to jump in front of a vehicle to kill self.  Patient also consumes "at least a case of beer" daily and some liquor to top it  off. Has been drinking at this rate for over a year. Patient drank prior to arrival. He denies use of other drugs. Smokes about two packs of cigarettes daily.  Patient denies any HI or A/V hallucinations at this time. He does not have any outpatient care. Last inpatient psych care was February of 2014.  -Pt meets inpatient care criteria per Serena Colonel, NP. There are no beds at [facility] however, placement should be sought elsewhere.  Today, patient is actively withdrawing with severe tremors--medications given immediately on assessment for withdrawal symptoms and high blood pressure.  Fluids encouraged.  Resting quietly at this time with vital signs WNL except slightly tachycardia. HPI Elements:   Location:  generalized. Quality:  acute. Severity:  severe. Timing:  constant. Duration:  recently . Context:  stressors.  Past Psychiatric History: Past Medical History  Diagnosis Date  . Coronary artery disease   . Degenerative joint disease of spine   . Pulmonary embolism 06/2010  . Degenerative joint disease   . 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 ETOH abuse   . DVT (deep venous thrombosis)   . Hepatitis B   . Mental disorder   . Depression     reports that he has been smoking Cigarettes.  He has a 60 pack-year smoking history. He has never used smokeless tobacco. He reports that he drinks about 7.2 ounces of alcohol per week. He reports that he does not use illicit drugs. Family History  Problem  Relation Age of Onset  . Cancer Mother     Ovarian   Family History Substance Abuse: Yes, Describe: (ETOH runs in family) Family Supports: No Living Arrangements: Other (Comment) (Pt is homeless) Can pt return to current living arrangement?: Yes Abuse/Neglect [facility]) Physical Abuse: Denies Verbal Abuse: Denies Sexual Abuse: Denies Allergies:  No Known Allergies  ACT Assessment Complete:  Yes:    Educational Status    Risk to Self: Risk to self Suicidal Ideation: Yes-Currently  Present Suicidal Intent: Yes-Currently Present Is patient at risk for suicide?: Yes Suicidal Plan?: Yes-Currently Present Specify Current Suicidal Plan: Step in front of a car Access to Means: No What has been your use of drugs/alcohol within the last 12 months?: ETOH use daily Previous Attempts/Gestures: Yes How many times?:  (Unknown) Other Self Harm Risks: SA issues Triggers for Past Attempts: Unpredictable Intentional Self Injurious Behavior: None Family Suicide History: Unknown Recent stressful life event(s): Other (Comment) (Health problems) Persecutory voices/beliefs?: No Depression: Yes Depression Symptoms: Despondent;Insomnia;Isolating;Fatigue;Loss of interest in usual pleasures;Feeling worthless/self pity Substance abuse history and/or treatment for substance abuse?: Yes (BAL 288    UDS negative) Suicide prevention information given to non-admitted patients: Not applicable  Risk to Others: Risk to Others Homicidal Ideation: No Thoughts of Harm to Others: No Current Homicidal Intent: No Current Homicidal Plan: No Access to Homicidal Means: No Identified Victim: No one History of harm to others?: No Assessment of Violence: None Noted Violent Behavior Description: None reported Does patient have access to weapons?: No Criminal Charges Pending?: No Does patient have a court date: No  Abuse: Abuse/Neglect Assessment (Assessment to be complete while patient is alone) Physical Abuse: Denies Verbal Abuse: Denies Sexual Abuse: Denies Exploitation of patient/patient's resources: Denies Self-Neglect: Denies  Prior Inpatient Therapy: Prior Inpatient Therapy Prior Inpatient Therapy: Yes Prior Therapy Dates: Feb '14 Prior Therapy Facilty/Provider(s): [facility] Reason for Treatment: depression  Prior Outpatient Therapy: Prior Outpatient Therapy Prior Outpatient Therapy: No Prior Therapy Dates: N/A Prior Therapy Facilty/Provider(s): N/A Reason for Treatment: N/A  Additional  Information: Additional Information 1:1 In Past 12 Months?: No CIRT Risk: No Elopement Risk: No Does patient have medical clearance?: Yes                  Objective: Blood pressure 128/78, pulse 105, temperature 98.1 F (36.7 C), temperature source Oral, resp. rate 18, SpO2 94.00%.There is no weight on file to calculate BMI. Results for orders placed during the hospital encounter of 01/26/14 (from the past 72 hour(s))  CBC     Status: Abnormal   Collection Time    01/26/14 10:36 PM      Result Value Ref Range   WBC 7.9  4.0 - 10.5 K/uL   RBC 4.15 (*) 4.22 - 5.81 MIL/uL   Hemoglobin 14.3  13.0 - 17.0 g/dL   HCT 41.7  39.0 - 52.0 %   MCV 100.5 (*) 78.0 - 100.0 fL   MCH 34.5 (*) 26.0 - 34.0 pg   MCHC 34.3  30.0 - 36.0 g/dL   RDW 13.9  11.5 - 15.5 %   Platelets 209  150 - 400 K/uL  COMPREHENSIVE METABOLIC PANEL     Status: Abnormal   Collection Time    01/26/14 10:36 PM      Result Value Ref Range   Sodium 140  137 - 147 mEq/L   Potassium 4.0  3.7 - 5.3 mEq/L   Chloride 101  96 - 112 mEq/L   CO2 21  19 -  32 mEq/L   Glucose, Bld 92  70 - 99 mg/dL   BUN 9  6 - 23 mg/dL   Creatinine, Ser 0.76  0.50 - 1.35 mg/dL   Calcium 8.7  8.4 - 10.5 mg/dL   Total Protein 7.1  6.0 - 8.3 g/dL   Albumin 3.5  3.5 - 5.2 g/dL   AST 43 (*) 0 - 37 U/L   ALT 26  0 - 53 U/L   Alkaline Phosphatase 82  39 - 117 U/L   Total Bilirubin <0.2 (*) 0.3 - 1.2 mg/dL   GFR calc non Af Amer >90  >90 mL/min   GFR calc Af Amer >90  >90 mL/min   Comment: (NOTE)     The eGFR has been calculated using the CKD EPI equation.     This calculation has not been validated in all clinical situations.     eGFR's persistently <90 mL/min signify possible Chronic Kidney     Disease.  ETHANOL     Status: Abnormal   Collection Time    01/26/14 10:36 PM      Result Value Ref Range   Alcohol, Ethyl (B) 288 (*) 0 - 11 mg/dL   Comment:            LOWEST DETECTABLE LIMIT FOR     SERUM ALCOHOL IS 11 mg/dL      FOR MEDICAL PURPOSES ONLY  URINE RAPID DRUG SCREEN (HOSP PERFORMED)     Status: None   Collection Time    01/27/14  6:08 AM      Result Value Ref Range   Opiates NONE DETECTED  NONE DETECTED   Cocaine NONE DETECTED  NONE DETECTED   Benzodiazepines NONE DETECTED  NONE DETECTED   Amphetamines NONE DETECTED  NONE DETECTED   Tetrahydrocannabinol NONE DETECTED  NONE DETECTED   Barbiturates NONE DETECTED  NONE DETECTED   Comment:            DRUG SCREEN FOR MEDICAL PURPOSES     ONLY.  IF CONFIRMATION IS NEEDED     FOR ANY PURPOSE, NOTIFY LAB     WITHIN 5 DAYS.                LOWEST DETECTABLE LIMITS     FOR URINE DRUG SCREEN     Drug Class       Cutoff (ng/mL)     Amphetamine      1000     Barbiturate      200     Benzodiazepine   811     Tricyclics       914     Opiates          300     Cocaine          300     THC              50   Labs are reviewed and are pertinent for medical issus being addressed.  Current Facility-Administered Medications  Medication Dose Route Frequency Provider Last Rate Last Dose  . acetaminophen (TYLENOL) tablet 650 mg  650 mg Oral Q4H PRN Mariea Clonts, MD      . LORazepam (ATIVAN) tablet 0-4 mg  0-4 mg Oral 4 times per day Mariea Clonts, MD   2 mg at 01/27/14 7829   Followed by  . [START ON 01/29/2014] LORazepam (ATIVAN) tablet 0-4 mg  0-4 mg Oral Q12H Mariea Clonts, MD      .  LORazepam (ATIVAN) tablet 1 mg  1 mg Oral Q8H PRN Mariea Clonts, MD      . thiamine (VITAMIN B-1) tablet 100 mg  100 mg Oral Daily Mariea Clonts, MD   100 mg at 01/27/14 1011   Or  . thiamine (B-1) injection 100 mg  100 mg Intravenous Daily Mariea Clonts, MD       Current Outpatient Prescriptions  Medication Sig Dispense Refill  . Aspirin-Acetaminophen (GOODYS BODY PAIN PO) Take 1 Package by mouth 2 (two) times daily as needed (pain).      Marland Kitchen ibuprofen (ADVIL,MOTRIN) 200 MG tablet Take 400 mg by mouth every 6 (six) hours as needed for moderate pain.      .  rivaroxaban (XARELTO) 20 MG TABS tablet Take 20 mg by mouth daily with supper.        Psychiatric Specialty Exam:     Blood pressure 128/78, pulse 105, temperature 98.1 F (36.7 C), temperature source Oral, resp. rate 18, SpO2 94.00%.There is no weight on file to calculate BMI.  General Appearance: Disheveled  Eye Contact::  Minimal  Speech:  Slow  Volume:  Decreased  Mood:  Anxious and Depressed  Affect:  Congruent  Thought Process:  Coherent  Orientation:  Full (Time, Place, and Person)  Thought Content:  WDL  Suicidal Thoughts:  Yes with plan and intent  Homicidal Thoughts:  No  Memory:  Immediate;   Poor Recent;   Poor Remote;   Poor  Judgement:  Poor  Insight:  Fair  Psychomotor Activity:  Decreased  Concentration:  Poor  Recall:  Poor  Fund of Knowledge:Fair  Language: Fair  Akathisia:  No  Handed:  Right  AIMS (if indicated):     Assets:  Resilience  Sleep:      Musculoskeletal: Strength & Muscle Tone: within normal limits Gait & Station: normal Patient leans: N/A  Treatment Plan Summary: Daily contact with patient to assess and evaluate symptoms and progress in treatment Medication management; CIWA protocol, clonidine for HTN, fluids encouraged.  Admit to inpatient hospitalization for alcohol detox.  Waylan Boga, PMH-NP 01/27/2014 3:06 PM  Patient was seen face-to-face for the psychiatric evaluation, case discussed with physician extender and formulated treatment plan. Reviewed the information documented and agree with the treatment plan.  Parke Simmers Kaige Whistler 01/27/2014 4:13 PM

## 2014-01-27 NOTE — Progress Notes (Signed)
48 year old male pt admitted on voluntary basis. Pt reports that he is here for detox from alcohol. Pt reports that he is homeless and unsure of where he will go once he gets discharged from this facility. Pt tremulous, sweating, anxious and in active withdrawal on admission. Pt has DVT in left leg and reports that he has not his medication to take because he is waiting on Medicaid to be approved and can not afford without it. Pt reports having gone through detox in the past and reports that he was at [facility] for that many years ago. Pt does endorse depression and passive SI but able to contract for safety on the unit. Pt was oriented to the unit and safety maintained.

## 2014-01-28 DIAGNOSIS — F1010 Alcohol abuse, uncomplicated: Secondary | ICD-10-CM

## 2014-01-28 DIAGNOSIS — F332 Major depressive disorder, recurrent severe without psychotic features: Secondary | ICD-10-CM

## 2014-01-28 MED ORDER — NICOTINE 21 MG/24HR TD PT24
21.0000 mg | MEDICATED_PATCH | Freq: Every day | TRANSDERMAL | Status: DC
  Administered 2014-01-28 – 2014-02-02 (×5): 21 mg via TRANSDERMAL
  Filled 2014-01-28 (×8): qty 1

## 2014-01-28 NOTE — [facility] Suicide Risk Assessment (Signed)
Suicide Risk Assessment  Admission Assessment     Nursing information obtained from:    Demographic factors:    Current Mental Status:    Loss Factors:    Historical Factors:    Risk Reduction Factors:    Total Time spent with patient: 45 minutes  CLINICAL FACTORS:   Depression:   Comorbid alcohol abuse/dependence Alcohol/Substance Abuse/Dependencies  Psychiatric Specialty Exam:     Blood pressure 111/77, pulse 86, temperature 98 F (36.7 C), temperature source Oral, resp. rate 18, height 5' 11.5" (1.816 m), weight 91.627 kg (202 lb).Body mass index is 27.78 kg/(m^2).  General Appearance: Fairly Groomed  Engineer, water::  Fair  Speech:  Clear and Coherent, Slow and not spontaneous  Volume:  Decreased  Mood:  Depressed  Affect:  Restricted  Thought Process:  Coherent and Goal Directed  Orientation:  Full (Time, Place, and Person)  Thought Content:  symtpoms, worries, concerns  Suicidal Thoughts:  No  Homicidal Thoughts:  No  Memory:  Immediate;   Fair Recent;   Fair Remote;   Fair  Judgement:  Fair  Insight:  Present and Shallow  Psychomotor Activity:  Decreased  Concentration:  Fair  Recall:  [employer] of Knowledge:NA  Language: Fair  Akathisia:  No  Handed:    AIMS (if indicated):     Assets:  Desire for Improvement  Sleep:  Number of Hours: 6   Musculoskeletal: Strength & Muscle Tone: within normal limits Gait & Station: normal Patient leans: N/A  COGNITIVE FEATURES THAT CONTRIBUTE TO RISK:  Closed-mindedness Polarized thinking Thought constriction (tunnel vision)    SUICIDE RISK:   Moderate:  Frequent suicidal ideation with limited intensity, and duration, some specificity in terms of plans, no associated intent, good self-control, limited dysphoria/symptomatology, some risk factors present, and identifiable protective factors, including available and accessible social support.  PLAN OF CARE: Supportive approach/coping skills/relapse prevention                      [facility] detox protocol                               Reassess and address the co morbiditied  I certify that inpatient services furnished can reasonably be expected to improve the patient's condition.  [name] 01/28/2014, 1:35 PM

## 2014-01-28 NOTE — Progress Notes (Signed)
D) Pt has attended some of the groups. Affect is flat and mood depressed. Rates his depression at a 0 and his hopelessness at an 8. Denies SI and HI, however in conversation Pt states he was feeling hopeless due to his physical illnesses and fearful that "I am just going to die anyway. I know that it is going to be painful and I would rather just get it over with". Today is feeling a bit more hopeful that he will be able to get his medications that help him with his medical issues.  A) Given support and reassurance along with praise. Encouraged to talk with the social worker on Monday so that arrangements can be made to provide Pt with his medical medications. R) Pt denies SI and HI.

## 2014-01-28 NOTE — [facility] Group Notes (Signed)
[place] Group Notes:  (Nursing/MHT/Case Management/Adjunct)  Date:  01/28/2014  Time:  1:47 PM  Type of Therapy:  Psychoeducational Skills  Participation Level:  Active  Participation Quality:  Appropriate  Affect:  Appropriate  Cognitive:  Appropriate  Insight:  Appropriate  Engagement in Group:  Engaged  Modes of Intervention:  Education  Summary of Progress/Problems: Pt attended healthy support systems and self esteem group, and was able to engage.   Hila Bolding Shanta Arlean Thies 01/28/2014, 1:47 PM

## 2014-01-28 NOTE — Progress Notes (Signed)
[place] Group Notes:  (Nursing/MHT/Case Management/Adjunct)  Date:  01/28/2014  Time:  9:45 PM  Type of Therapy:  Group Therapy  Participation Level:  Minimal  Participation Quality:  Appropriate  Affect:  Appropriate  Cognitive:  Alert  Insight:  Good  Engagement in Group:  Engaged  Modes of Intervention:  Discussion  Summary of Progress/Problems: Patient says that his day was different, he says that he spent today just learning the schedule and going to groups. The positive thing that happened for him was the fact of him just being here.  Kathrynn Ducking 01/28/2014, 9:45 PM

## 2014-01-28 NOTE — H&P (Signed)
Psychiatric Admission Assessment Adult  Patient Identification:  [name] Date of Evaluation:  01/28/2014 Chief Complaint:  Alcohol Abuse  Subjective: Pt seen and chart reviewed. Pt reports longstanding depression with improvement since admission to [facility] thus far with medication intervention and group therapy participation. Pt denies SI, HI, and AVH, contracts for safety. 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 Dusky red/purple, blanching, and slightly tender to palpation. Pt reports drinking approximately 12 beers per day and 1/5 liquor per day as well.   History of Present Illness:: Dr Reather Converse requested TTS consult because of patient suicidality. Patient has DVT in left leg and other health problems which is making him want to kill himself. Patient also drinks heavily. Has plan to step in front of a car to get run over. Patient said that he has been without his DVT medication for the last 2-3 days. He was told that his MCD had lapsed and it may take 90 days to get it reinstated. He said that he is in such pain and that without medication or surgery he will die "a miserable death, it makes sense for me to kill myself rather than suffer." Patient endorses plan to jump in front of a vehicle to kill self.  Patient also consumes "at least a case of beer" daily and some liquor to top it off. Has been drinking at this rate for over a year. Patient drank prior to arrival. He denies use of other drugs. Smokes about two packs of cigarettes daily. Patient denies any HI or A/V hallucinations at this time. He does not have any outpatient care. Last inpatient psych care was February of 2014.   Elements:  Location:  Generalized, Inpatient [facility]. Quality:  Improving. Severity:  Severe Timing:  Chronic. Duration:  Chronic. Context:  Pt is depressed about his inability to obtain his medications and fear of stroke and MI secondary to DVT and PE  history.. Associated Signs/Synptoms: Depression Symptoms:  depressed mood, anhedonia, insomnia, psychomotor retardation, fatigue, feelings of worthlessness/guilt, difficulty concentrating, hopelessness, impaired memory, recurrent thoughts of death, anxiety, loss of energy/fatigue, (Hypo) Manic Symptoms:  Impulsivity, Anxiety Symptoms:  Excessive Worry, Psychotic Symptoms:  Denies PTSD Symptoms: Denies Total Time spent with patient: 40 minutes  Psychiatric Specialty Exam: Physical Exam Full Physical Exam performed in ED; reviewed, stable, and I concur with this assessment.   Review of Systems  Constitutional: Negative.   HENT: Negative.   Eyes: Negative.   Respiratory: Negative.   Cardiovascular: Negative.   Gastrointestinal: Negative.   Genitourinary: Negative.   Musculoskeletal: Negative.   Skin: Negative.   Neurological: Negative.   Endo/Heme/Allergies: Negative.   Psychiatric/Behavioral: Positive for depression. The patient is nervous/anxious.     Blood pressure 128/80, pulse 90, temperature 98 F (36.7 C), temperature source Oral, resp. rate 18, height 5' 11.5" (1.816 m), weight 91.627 kg (202 lb).Body mass index is 27.78 kg/(m^2).   General Appearance: Fairly Groomed   Engineer, water:: Fair   Speech: Clear and Coherent, Slow and not spontaneous   Volume: Decreased   Mood: Depressed   Affect: Restricted   Thought Process: Coherent and Goal Directed   Orientation: Full (Time, Place, and Person)   Thought Content: symtpoms, worries, concerns   Suicidal Thoughts: No   Homicidal Thoughts: No   Memory: Immediate; Fair  Recent; Fair  Remote; Fair   Judgement: Fair   Insight: Present and Shallow   Psychomotor Activity: Decreased  Concentration: Fair   Recall: [employer] of Knowledge:NA   Language: Fair   Akathisia: No   Handed:   AIMS (if indicated):   Assets: Desire for Improvement   Sleep: Number of Hours: 6    Musculoskeletal:  Strength & Muscle  Tone: within normal limits  Gait & Station: normal  Patient leans: N/A   Past Psychiatric History: Diagnosis: MDD with SI  Hospitalizations: Denies  Outpatient Care: Denies  Substance Abuse Care: AA historically  Self-Mutilation: Denies  Suicidal Attempts: Denies  Violent Behaviors: Yes towards others   Past Medical History:   Past Medical History  Diagnosis Date  . Coronary artery disease   . Degenerative joint disease of spine   . Pulmonary embolism 06/2010  . Degenerative joint disease   . 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 ETOH abuse   . DVT (deep venous thrombosis)   . Hepatitis B   . Mental disorder   . Depression    Cardiac History:  DVT, PE Allergies:  No Known Allergies PTA Medications: Prescriptions prior to admission  Medication Sig Dispense Refill  . Aspirin-Acetaminophen (GOODYS BODY PAIN PO) Take 1 Package by mouth 2 (two) times daily as needed (pain).      Marland Kitchen ibuprofen (ADVIL,MOTRIN) 200 MG tablet Take 400 mg by mouth every 6 (six) hours as needed for moderate pain.      . rivaroxaban (XARELTO) 20 MG TABS tablet Take 20 mg by mouth daily with supper.        Previous Psychotropic Medications:  Medication/Dose  SEE MAR               Substance Abuse History in the last 12 months:  yes  Consequences of Substance Abuse: Medical Consequences:  Hospital admission  Social History:  reports that he has been smoking Cigarettes.  He has a 60 pack-year smoking history. He has never used smokeless tobacco. He reports that he drinks about 7.2 ounces of alcohol per week. He reports that he does not use illicit drugs. Additional Social History:                      Current Place of Residence:  [place] Birth:  [place] Family Members: Friends only Marital Status:  Single Children: DENIES  Sons:  Daughters: Relationships: Single Education:  HS Educational Problems/Performance: Denies Religious Beliefs/Practices:  History of Abuse  (Emotional/Phsycial/Sexual) Occupational Experiences; Midwife History:  Denies Legal History: Denies Hobbies/Interests: Reading  Family History:   Family History  Problem Relation Age of Onset  . Cancer Mother     Ovarian    Results for orders placed during the hospital encounter of 01/26/14 (from the past 72 hour(s))  CBC     Status: Abnormal   Collection Time    01/26/14 10:36 PM      Result Value Ref Range   WBC 7.9  4.0 - 10.5 K/uL   RBC 4.15 (*) 4.22 - 5.81 MIL/uL   Hemoglobin 14.3  13.0 - 17.0 g/dL   HCT 41.7  39.0 - 52.0 %   MCV 100.5 (*) 78.0 - 100.0 fL   MCH 34.5 (*) 26.0 - 34.0 pg   MCHC 34.3  30.0 - 36.0 g/dL   RDW 13.9  11.5 - 15.5 %   Platelets 209  150 - 400 K/uL  COMPREHENSIVE METABOLIC PANEL     Status: Abnormal   Collection Time    01/26/14 10:36 PM      Result  Value Ref Range   Sodium 140  137 - 147 mEq/L   Potassium 4.0  3.7 - 5.3 mEq/L   Chloride 101  96 - 112 mEq/L   CO2 21  19 - 32 mEq/L   Glucose, Bld 92  70 - 99 mg/dL   BUN 9  6 - 23 mg/dL   Creatinine, Ser 0.76  0.50 - 1.35 mg/dL   Calcium 8.7  8.4 - 10.5 mg/dL   Total Protein 7.1  6.0 - 8.3 g/dL   Albumin 3.5  3.5 - 5.2 g/dL   AST 43 (*) 0 - 37 U/L   ALT 26  0 - 53 U/L   Alkaline Phosphatase 82  39 - 117 U/L   Total Bilirubin <0.2 (*) 0.3 - 1.2 mg/dL   GFR calc non Af Amer >90  >90 mL/min   GFR calc Af Amer >90  >90 mL/min   Comment: (NOTE)     The eGFR has been calculated using the CKD EPI equation.     This calculation has not been validated in all clinical situations.     eGFR's persistently <90 mL/min signify possible Chronic Kidney     Disease.  ETHANOL     Status: Abnormal   Collection Time    01/26/14 10:36 PM      Result Value Ref Range   Alcohol, Ethyl (B) 288 (*) 0 - 11 mg/dL   Comment:            LOWEST DETECTABLE LIMIT FOR     SERUM ALCOHOL IS 11 mg/dL     FOR MEDICAL PURPOSES ONLY  URINE RAPID DRUG SCREEN (HOSP PERFORMED)     Status: None   Collection  Time    01/27/14  6:08 AM      Result Value Ref Range   Opiates NONE DETECTED  NONE DETECTED   Cocaine NONE DETECTED  NONE DETECTED   Benzodiazepines NONE DETECTED  NONE DETECTED   Amphetamines NONE DETECTED  NONE DETECTED   Tetrahydrocannabinol NONE DETECTED  NONE DETECTED   Barbiturates NONE DETECTED  NONE DETECTED   Comment:            DRUG SCREEN FOR MEDICAL PURPOSES     ONLY.  IF CONFIRMATION IS NEEDED     FOR ANY PURPOSE, NOTIFY LAB     WITHIN 5 DAYS.                LOWEST DETECTABLE LIMITS     FOR URINE DRUG SCREEN     Drug Class       Cutoff (ng/mL)     Amphetamine      1000     Barbiturate      200     Benzodiazepine   254     Tricyclics       982     Opiates          300     Cocaine          300     THC              50   Psychological Evaluations:  Assessment:   DSM5:  Substance/Addictive Disorders:  Alcohol Related Disorder - Severe (303.90) Depressive Disorders:  Major Depressive Disorder - Severe (296.23)  AXIS I:  Alcohol Abuse, Major Depression, Recurrent severe and Substance Induced Mood Disorder AXIS II:  Deferred AXIS III:   Past Medical History  Diagnosis Date  . Coronary artery disease   .  Degenerative joint disease of spine   . Pulmonary embolism 06/2010  . Degenerative joint disease   . 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 ETOH abuse   . DVT (deep venous thrombosis)   . Hepatitis B   . Mental disorder   . Depression    AXIS IV:  other psychosocial or environmental problems and problems related to social environment AXIS V:  41-50 serious symptoms  Treatment Plan/Recommendations:   Review of chart, vital signs, medications, and notes.  1-Individual and group therapy  2-Medication management for depression and anxiety: Medications reviewed with the patient and she stated no untoward effects, unchanged. 3-Coping skills for depression, anxiety  4-Continue crisis stabilization and management  5-Address health issues--monitoring vital  signs, stable  6-Treatment plan in progress to prevent relapse of depression and anxiety  Treatment Plan Summary: Daily contact with patient to assess and evaluate symptoms and progress in treatment Medication management Current Medications:  Current Facility-Administered Medications  Medication Dose Route Frequency Provider Last Rate Last Dose  . acetaminophen (TYLENOL) tablet 650 mg  650 mg Oral Q4H PRN Waylan Boga, NP      . alum & mag hydroxide-simeth (MAALOX/MYLANTA) 200-200-20 MG/5ML suspension 30 mL  30 mL Oral Q4H PRN Waylan Boga, NP      . cephALEXin (KEFLEX) capsule 500 mg  500 mg Oral 3 times per day Waylan Boga, NP   500 mg at 01/28/14 1439  . chlordiazePOXIDE (LIBRIUM) capsule 25 mg  25 mg Oral Q6H PRN Waylan Boga, NP   25 mg at 01/28/14 1439  . chlordiazePOXIDE (LIBRIUM) capsule 25 mg  25 mg Oral QID Waylan Boga, NP   25 mg at 01/28/14 1726   Followed by  . [START ON 01/29/2014] chlordiazePOXIDE (LIBRIUM) capsule 25 mg  25 mg Oral TID Waylan Boga, NP       Followed by  . [START ON 01/30/2014] chlordiazePOXIDE (LIBRIUM) capsule 25 mg  25 mg Oral BH-qamhs Waylan Boga, NP       Followed by  . [START ON 02/01/2014] chlordiazePOXIDE (LIBRIUM) capsule 25 mg  25 mg Oral Daily Waylan Boga, NP      . cloNIDine (CATAPRES) tablet 0.1 mg  0.1 mg Oral BID PRN Waylan Boga, NP   0.1 mg at 01/27/14 2108  . hydrOXYzine (ATARAX/VISTARIL) tablet 25 mg  25 mg Oral Q6H PRN Waylan Boga, NP   25 mg at 01/27/14 2129  . loperamide (IMODIUM) capsule 2-4 mg  2-4 mg Oral PRN Waylan Boga, NP      . magnesium hydroxide (MILK OF MAGNESIA) suspension 30 mL  30 mL Oral Daily PRN Waylan Boga, NP      . multivitamin with minerals tablet 1 tablet  1 tablet Oral Daily Waylan Boga, NP   1 tablet at 01/28/14 0840  . nicotine (NICODERM CQ - dosed in mg/24 hours) patch 21 mg  21 mg Transdermal Q0600 Benjamine Mola, FNP   21 mg at 01/28/14 1438  . ondansetron (ZOFRAN-ODT) disintegrating tablet 4 mg  4 mg  Oral Q6H PRN Waylan Boga, NP      . rivaroxaban (XARELTO) tablet 20 mg  20 mg Oral Daily Waylan Boga, NP   20 mg at 01/28/14 8416  . thiamine (VITAMIN B-1) tablet 100 mg  100 mg Oral Daily Waylan Boga, NP   100 mg at 01/28/14 6063     Observation Level/Precautions:  15 minute checks  Laboratory:  Labs resulted, reviewed, and stable at this time.   Psychotherapy:  Group  therapy, individual therapy, psychoeducation  Medications:  See MAR above  Consultations: None    Discharge Concerns: None    Estimated LOS: 5-7 days  Other:  N/A   I certify that inpatient services furnished can reasonably be expected to improve the patient's condition.   Benjamine Mola, FNP-BC  5/24/20157:33 PM  Personally assessed the patient, reviewed the physical exam and labs and formulated the treatment plan Geralyn Flash A. Sabra Heck, M.D.

## 2014-01-28 NOTE — [facility] Group Notes (Signed)
[facility] Group Notes:  (Clinical Social Work)  01/28/2014   1:15-2:15PM  Summary of Progress/Problems:  The main focus of today's process group was to identify the patient's current support system and encourage patients about other supports that can be put in place.  The picture on workbook was used to discuss why additional supports are needed.  An emphasis was placed on using counselor, doctor, therapy groups, 12-step groups, and problem-specific support groups to expand supports.   There was also an extensive discussion about what constitutes a healthy support versus an unhealthy support.  Finally, a scaling question (1 = least to 10 = most) was used to determination the patient's willingness to do something different upon discharge that will build supports.  The patient expressed full comprehension of the concepts presented, and agreed that there is a need to add more supports.   [name] stated the current support includes only negative things like people he would drink with, and that he has no positive supports he can turn to.  His willingness to focus on building additional support is "5."  He states that he has tried different things over and over, tried different ways of doing things, but he always fails, and he is very discouraged whether to even try again.  Type of Therapy:  Process Group with Motivational Interviewing  Participation Level:  Minimal  Participation Quality:  Attentive and Sharing  Affect:  Blunted and Depressed  Cognitive:  Appropriate and Oriented  Insight:  Limited  Engagement in Therapy:  Improving  Modes of Intervention:  Education,  Support and [employer], LCSW 01/28/2014, 4:00pm

## 2014-01-28 NOTE — Progress Notes (Signed)
Patient ID: [name], male   DOB: 08-13-1966, 48 y.o.   MRN: [id number] Attempt made to meet with patient to complete PSA unsuccessful as patient in dayroom for RN Group. Sheilah Pigeon, LCSW

## 2014-01-28 NOTE — [facility] Group Notes (Signed)
[facility] Group Notes:  (Nursing/MHT/Case Management/Adjunct)  Date:  01/28/2014  Time:  1:25 PM   Type of Therapy:  Psychoeducational Skills  Participation Level:  Active  Participation Quality:  Appropriate  Affect:  Appropriate  Cognitive:  Appropriate  Insight:  Appropriate  Engagement in Group:  Engaged  Modes of Intervention:  Discussion  Summary of Progress/Problems: Pt did attend spirituality and self inventory group, pt reported that he was negative SI/HI, no AH/VH noted. Pt rated his depression as a 6, and his helplessness/hopelessness as a 8.     [name] Gaster 01/28/2014, 1:25 PM

## 2014-01-29 DIAGNOSIS — F1994 Other psychoactive substance use, unspecified with psychoactive substance-induced mood disorder: Secondary | ICD-10-CM

## 2014-01-29 DIAGNOSIS — F4321 Adjustment disorder with depressed mood: Secondary | ICD-10-CM

## 2014-01-29 DIAGNOSIS — R45851 Suicidal ideations: Secondary | ICD-10-CM

## 2014-01-29 DIAGNOSIS — F1020 Alcohol dependence, uncomplicated: Principal | ICD-10-CM

## 2014-01-29 MED ORDER — TRAZODONE HCL 50 MG PO TABS
50.0000 mg | ORAL_TABLET | Freq: Every evening | ORAL | Status: DC | PRN
  Administered 2014-01-29 – 2014-02-01 (×6): 50 mg via ORAL
  Filled 2014-01-29: qty 1
  Filled 2014-01-29: qty 14
  Filled 2014-01-29 (×10): qty 1
  Filled 2014-01-29: qty 14
  Filled 2014-01-29 (×2): qty 1

## 2014-01-29 NOTE — Progress Notes (Signed)
D: Patient resting in bed with eyes closed.  Respirations even and unlabored.  Patient appears to be in no apparent distress. A: Staff to monitor Q 15 mins for safety.   R:Patient remains safe on the unit.  

## 2014-01-29 NOTE — Progress Notes (Signed)
Writer observed patient up in the dayroom watching tv with minimal interaction with peers. Writer spoke with him 1:1 and he c/o withdrawal symptoms such as sweats, chills, feeling anxious and loose stools. Writer medicated him according to his symptoms. Writer encouraged him to drink plenty of fluids to keep himself hydrated. He denies si/hi/a/v hallucinations. He attended group this evening and participated. Patient reports an interest in attending group on [address] hall. Safety maintained on unit with 15 min checks.

## 2014-01-29 NOTE — Progress Notes (Signed)
Adult Psychoeducational Group Note  Date:  01/29/2014 Time:  9:29 PM  Group Topic/Focus:  Wrap-Up Group:   The focus of this group is to help patients review their daily goal of treatment and discuss progress on daily workbooks.  Participation Level:  Active  Participation Quality:  Appropriate  Affect:  Appropriate  Cognitive:  Appropriate  Insight: Appropriate  Engagement in Group:  Engaged  Modes of Intervention:  Support  Additional Comments:  Pt stated that positive thing that happened today was that he was able to go outside, he states that it is also one of his coping skills to go walking  Spero Gunnels 01/29/2014, 9:29 PM

## 2014-01-29 NOTE — Progress Notes (Signed)
NUTRITION ASSESSMENT  Pt identified as at risk on the Malnutrition Screen Tool  INTERVENTION: 1. Educated patient on the importance of nutrition and encouraged intake of food and beverages. 2. Discussed weight goals. 3. Supplements: MVI and thiamine  NUTRITION DIAGNOSIS: Unintentional weight loss related to sub-optimal intake as evidenced by pt report.   Goal: Pt to meet >/= 90% of their estimated nutrition needs.  Monitor:  PO intake  Assessment:  Patient admitted  With SI, etoh abuse, homeless.  Patient reports good inow with improving appetite.  Has not been eating well with mostly take out for a long time.    48 y.o. male  Height: Ht Readings from Last 1 Encounters:  01/27/14 5' 11.5" (1.816 m)    Weight: Wt Readings from Last 1 Encounters:  01/27/14 202 lb (91.627 kg)    Weight Hx: Wt Readings from Last 10 Encounters:  01/27/14 202 lb (91.627 kg)  07/14/13 196 lb 1.6 oz (88.95 kg)  06/12/13 191 lb 9.3 oz (86.9 kg)  04/21/13 194 lb 1.6 oz (88.043 kg)  04/21/13 194 lb 1.6 oz (88.043 kg)  04/03/13 192 lb 10.9 oz (87.4 kg)  03/22/13 190 lb (86.183 kg)  01/19/13 198 lb 11.2 oz (90.13 kg)  01/01/13 191 lb 6.4 oz (86.818 kg)  12/22/12 200 lb 4.8 oz (90.855 kg)    BMI:  Body mass index is 27.78 kg/(m^2). Weight is normal for body frame and stature.  Estimated Nutritional Needs: Kcal: 25-30 kcal/kg Protein: > 1 gram protein/kg Fluid: 1 ml/kcal  Diet Order: Cardiac Pt is also offered choice of unit snacks mid-morning and mid-afternoon.  Pt is eating as desired.   Lab results and medications reviewed.   Antonieta Iba, RD, LDN Clinical Inpatient Dietitian Pager:  [phone] Weekend and after hours pager:  [phone]

## 2014-01-29 NOTE — Progress Notes (Signed)
[facility] MD Progress Note  01/29/2014 2:36 PM [name]  MRN:  [id number] Subjective:  Patient was seen and chart reviewed. Patient was admitted with alcohol dependence and substance-induced mood disorder with suicidal ideation a. Patient reported he is trying to jump in front of the traffic to end his life because he does not have Medicaid to get the treatment for his DVT. Patient endorses that he has been drinking all his life and interested in outpatient substance in abuse counseling upon discharge. Diagnosis:   DSM5: Schizophrenia Disorders:   Obsessive-Compulsive Disorders:   Trauma-Stressor Disorders:   Substance/Addictive Disorders:   Depressive Disorders:   Total Time spent with patient: 30 minutes  Axis I: Adjustment Disorder with Depressed Mood, Substance Induced Mood Disorder and Alcohol dependence   ADL's:  Impaired  Sleep: Fair  Appetite:  Fair  Suicidal Ideation:  Patient endorses suicidal ideation but contracts for safety while in the hospital Homicidal Ideation:  Patient denies homicidal ideation, intention or plans AEB (as evidenced by):  Psychiatric Specialty Exam: Physical Exam  ROS  Blood pressure 103/71, pulse 77, temperature 97.6 F (36.4 C), temperature source Oral, resp. rate 18, height 5' 11.5" (1.816 m), weight 91.627 kg (202 lb).Body mass index is 27.78 kg/(m^2).  General Appearance: Disheveled and Guarded  Eye Contact::  Good  Speech:  Clear and Coherent and Slow  Volume:  Decreased  Mood:  Anxious, Depressed, Hopeless and Worthless  Affect:  Depressed and Flat  Thought Process:  Coherent and Goal Directed  Orientation:  Full (Time, Place, and Person)  Thought Content:  WDL  Suicidal Thoughts:  Yes.  with intent/plan  Homicidal Thoughts:  No  Memory:  Immediate;   Fair  Judgement:  Impaired  Insight:  Lacking  Psychomotor Activity:  Decreased  Concentration:  Fair  Recall:  Good  Fund of Knowledge:Good  Language: Good  Akathisia:  NA   Handed:  Right  AIMS (if indicated):     Assets:  Communication Skills Desire for Improvement Leisure Time Resilience Social Support Talents/Skills Transportation  Sleep:  Number of Hours: 6.5   Musculoskeletal: Strength & Muscle Tone: within normal limits Gait & Station: normal Patient leans: N/A  Current Medications: Current Facility-Administered Medications  Medication Dose Route Frequency Provider Last Rate Last Dose  . acetaminophen (TYLENOL) tablet 650 mg  650 mg Oral Q4H PRN Waylan Boga, NP      . alum & mag hydroxide-simeth (MAALOX/MYLANTA) 200-200-20 MG/5ML suspension 30 mL  30 mL Oral Q4H PRN Waylan Boga, NP      . cephALEXin (KEFLEX) capsule 500 mg  500 mg Oral 3 times per day Waylan Boga, NP   500 mg at 01/29/14 0641  . chlordiazePOXIDE (LIBRIUM) capsule 25 mg  25 mg Oral Q6H PRN Waylan Boga, NP   25 mg at 01/28/14 1439  . chlordiazePOXIDE (LIBRIUM) capsule 25 mg  25 mg Oral TID Waylan Boga, NP   25 mg at 01/29/14 1156   Followed by  . [START ON 01/30/2014] chlordiazePOXIDE (LIBRIUM) capsule 25 mg  25 mg Oral BH-qamhs Waylan Boga, NP       Followed by  . [START ON 02/01/2014] chlordiazePOXIDE (LIBRIUM) capsule 25 mg  25 mg Oral Daily Waylan Boga, NP      . cloNIDine (CATAPRES) tablet 0.1 mg  0.1 mg Oral BID PRN Waylan Boga, NP   0.1 mg at 01/27/14 2108  . hydrOXYzine (ATARAX/VISTARIL) tablet 25 mg  25 mg Oral Q6H PRN Waylan Boga, NP   25 mg  at 01/28/14 2057  . loperamide (IMODIUM) capsule 2-4 mg  2-4 mg Oral PRN Waylan Boga, NP   4 mg at 01/28/14 2058  . magnesium hydroxide (MILK OF MAGNESIA) suspension 30 mL  30 mL Oral Daily PRN Waylan Boga, NP      . multivitamin with minerals tablet 1 tablet  1 tablet Oral Daily Waylan Boga, NP   1 tablet at 01/29/14 0827  . nicotine (NICODERM CQ - dosed in mg/24 hours) patch 21 mg  21 mg Transdermal Q0600 Benjamine Mola, FNP   21 mg at 01/28/14 1438  . ondansetron (ZOFRAN-ODT) disintegrating tablet 4 mg  4 mg Oral Q6H  PRN Waylan Boga, NP      . rivaroxaban (XARELTO) tablet 20 mg  20 mg Oral Daily Waylan Boga, NP   20 mg at 01/29/14 0827  . thiamine (VITAMIN B-1) tablet 100 mg  100 mg Oral Daily Waylan Boga, NP   100 mg at 01/29/14 6948    Lab Results: No results found for this or any previous visit (from the past 48 hour(s)).  Physical Findings: AIMS: Facial and Oral Movements Muscles of Facial Expression: None, normal Lips and Perioral Area: None, normal Jaw: None, normal Tongue: None, normal,Extremity Movements Upper (arms, wrists, hands, fingers): None, normal Lower (legs, knees, ankles, toes): None, normal, Trunk Movements Neck, shoulders, hips: None, normal, Overall Severity Severity of abnormal movements (highest score from questions above): None, normal Incapacitation due to abnormal movements: None, normal Patient's awareness of abnormal movements (rate only patient's report): No Awareness, Dental Status Current problems with teeth and/or dentures?: No Does patient usually wear dentures?: No  CIWA:  CIWA-Ar Total: 5 COWS:     Treatment Plan Summary: Daily contact with patient to assess and evaluate symptoms and progress in treatment Medication management  Plan: Continue alcohol detox treatment as per the protocol  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 Disposition plans are in progress and patient may be discharged to outpatient treatment on Wednesday if he continued to show clinical improvement and contracts for safety   Medical Decision Making Problem Points:  Established problem, worsening (2), New problem, with no additional work-up planned (3), Review of last therapy session (1), Review of psycho-social stressors (1) and Self-limited or minor (1) Data Points:  Order Aims Assessment (2) Review or order clinical lab tests (1) Review and summation of old records (2) Review of medication regiment & side effects (2)  I certify that inpatient services furnished can  reasonably be expected to improve the patient's condition.   Parke Simmers Haruki Arnold 01/29/2014, 2:36 PM

## 2014-01-29 NOTE — Progress Notes (Signed)
D: Pt denies SI/HI/AVH. Pt is pleasant and cooperative. Pt stated he had hard time sleeping. Pt stated he was on Remeron and trazodone when he was here last time.  A: Pt was offered support and encouragement. Pt was given scheduled medications. Pt was encourage to attend groups. Q 15 minute checks were done for safety.   R:Pt attends groups and interacts well with peers and staff. Pt is taking medication. Pt has no complaints.Pt receptive to treatment and safety maintained on unit.

## 2014-01-29 NOTE — Progress Notes (Signed)
Patient ID: [name], male   DOB: 10/28/1965, 48 y.o.   MRN: [id number] D- Patient reports sleep was fair and his appetite is improving.  His energy level is low and his ability to pay attention is improving.  He is rating depression and hopelessness at 5/10.  He is having chilling, anxiety and tremors.  He is not having thoughts of self harm. A- Supported  patient.  R- He says his cellulitis is an "Irritation" and ranks  it at 3/10.  He is worried about whether his medicaid will be approved.  He is rating his anxiety at 5/10.
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
## 2014-01-29 NOTE — BHH Suicide Risk Assessment (Signed)
[facility] INPATIENT:  Family/Significant Other Suicide Prevention Education  Suicide Prevention Education:  Patient Refusal for Family/Significant Other Suicide Prevention Education: The patient [name] has refused to provide written consent for family/significant other to be provided Family/Significant Other Suicide Prevention Education during admission and/or prior to discharge.  Physician notified.  Evie Lacks Nikesha Kwasny 01/29/2014, 10:12 AM

## 2014-01-30 MED ORDER — CITALOPRAM HYDROBROMIDE 10 MG PO TABS
10.0000 mg | ORAL_TABLET | Freq: Every day | ORAL | Status: DC
  Administered 2014-01-30 – 2014-01-31 (×2): 10 mg via ORAL
  Filled 2014-01-30 (×4): qty 1

## 2014-01-30 NOTE — Progress Notes (Signed)
EKG done, copy in chart.

## 2014-01-30 NOTE — Progress Notes (Signed)
The focus of this group is to educate the patient on the purpose and policies of crisis stabilization and provide a format to answer questions about their admission.  The group details unit policies and expectations of patients while admitted. Attened group, participated in group exercises.  Was attentive but did not share a goal

## 2014-01-30 NOTE — Progress Notes (Signed)
Adult Psychoeducational Group Note  Date:  01/30/2014 Time:  9:15 PM  Group Topic/Focus:  Wrap-Up Group:   The focus of this group is to help patients review their daily goal of treatment and discuss progress on daily workbooks.  Participation Level:  Active  Participation Quality:  Appropriate, Attentive, Sharing and Supportive  Affect:  Appropriate  Cognitive:  Alert, Appropriate and Oriented  Insight: Appropriate and Good  Engagement in Group:  Engaged and Supportive  Modes of Intervention:  Discussion, Education, Socialization and Support  Additional Comments:  Pt attended and participated in group.  Pt shared that he feels more energy today than he has since getting here.    Milus Glazier 01/30/2014, 9:15 PM

## 2014-01-30 NOTE — BHH Group Notes (Signed)
[facility] LCSW Group Therapy      Feelings About Diagnosis 1:15 - 2:30 PM         01/30/2014    Type of Therapy:  Group Therapy  Participation Level:  Active  Participation Quality:  Appropriate  Affect:  Appropriate  Cognitive:  Alert and Appropriate  Insight:  Developing/Improving and Engaged  Engagement in Therapy:  Developing/Improving and Engaged  Modes of Intervention:  Discussion, Education, Exploration, Problem-Solving, Rapport Building, Support  Summary of Progress/Problems:  Patient actively participated in group. Patient discussed past and present diagnosis and the effects it has had on  life.  Patient talked about family and society being judgmental and the stigma associated with having a mental health diagnosis. Patient shared he accepts his illness but has concerns that due to a gap in [place] he will not be able to get his medications.  [name] 01/30/2014

## 2014-01-30 NOTE — Progress Notes (Signed)
Patient ID: [name], male   DOB: 1965/10/08, 48 y.o.   MRN: [id number] Patient reports he slept fair and his energy level is low.  He is rating depression at 5/10 and he continues to have craving and feel inner agitation.  A- Supported patient.  R- Talked with patient about his feelings and he says that he frequently frowns "because I don't have much to feel good about".

## 2014-01-30 NOTE — Progress Notes (Signed)
Recreation Therapy Notes  Animal-Assisted Activity/Therapy (AAA/T) Program Checklist/Progress Notes Patient Eligibility Criteria Checklist & Daily Group note for Rec Tx Intervention  Date: 05.26.2015 Time: 2:45pm Location: [address]    AAA/T Program Assumption of Risk Form signed by Patient/ or Parent Legal Guardian yes  Patient is free of allergies or sever asthma yes  Patient reports no fear of animals yes  Patient reports no history of cruelty to animals yes   Patient understands his/her participation is voluntary yes  Patient washes hands before animal contact yes  Patient washes hands after animal contact yes  Behavioral Response: Appropriate.  Education: Contractor, Pensions consultant   Education Outcome: Acknowledges understanding  Clinical Observations/Feedback: Patient interacted appropriately in AAA session, petting therapy dog appropriately and interaction with peers appropriately.   Laureen Ochs Micha Erck, LRT/CTRS  Lane Hacker 01/30/2014 4:47 PM
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
## 2014-01-31 MED ORDER — RISPERIDONE 0.25 MG PO TABS
0.2500 mg | ORAL_TABLET | Freq: Two times a day (BID) | ORAL | Status: DC
  Administered 2014-01-31 – 2014-02-02 (×4): 0.25 mg via ORAL
  Filled 2014-01-31: qty 28
  Filled 2014-01-31 (×4): qty 1
  Filled 2014-01-31: qty 28
  Filled 2014-01-31 (×2): qty 1

## 2014-01-31 MED ORDER — CITALOPRAM HYDROBROMIDE 20 MG PO TABS
20.0000 mg | ORAL_TABLET | Freq: Every day | ORAL | Status: DC
  Administered 2014-02-01 – 2014-02-02 (×2): 20 mg via ORAL
  Filled 2014-01-31: qty 1
  Filled 2014-01-31: qty 14
  Filled 2014-01-31 (×2): qty 1

## 2014-01-31 NOTE — Progress Notes (Signed)
D: Pt presents with flat affect and depressed mood. Pt rates depression 8/10 this morning and stated that 5/10 is the least depressed he has ever been.  Pt denies having any triggers or stressors related to feeling depressed. Pt often appears withdrawn throughout the day. Pt denies SI/HI/AVH this morning. Pt compliant with taking meds and attending groups. Pt plans to live at the shelter once he is discharge. A: Medications administered as ordered per MD. Verbal support given. Pt encouraged to attend groups. 15 minute checks performed for safety. R:Pt safety maintained at this time.

## 2014-01-31 NOTE — Progress Notes (Signed)
Patient ID: Amoni Morales, male   DOB: 03-11-1966, 48 y.o.   MRN: [id number] Patient ID: Carrel Leather, male   DOB: Jun 16, 1966, 48 y.o.   MRN: [id number] [facility] MD Progress Note  01/31/2014 1:45 PM Bartholomew Ramesh  MRN:  [id number]  Subjective:  Patient was seen and chart reviewed. Patient has been continued to report feeling severe symptoms of depression, anxiety, irritability and agitation. Patient reported he is being taking his medication as prescribed and no reported adverse effects. Patient has completed EKG which seems to be within normal sinus rhythm and will start risperidone 0.25 mg for controlling irritability and agitation. Patient has a fine tremors in his both upper limbs but minimizes it is due to alcohol withdrawal. Patient continued to have moderate symptoms of shakiness, dizziness on standing, night sweats, poor sleep, but no nausea or vomiting. He has depression related 5/10 or higher, anxiety 6/10, and denies SI/HI or AVH. Patient contracts for safety while in the hospital. Patient states he worries constantly about MCD, out patient care, and multiple other things.  He says he argues a lot, has gotten into trouble for this, has lost his PCP due to this, and has been arrested for this in the past.  He has been told in the past that he has a "short fuse."  He reports that he got into plenty of fights in school but did graduate HS. He states he argues with someone every day 7/7 days and it is always a different person. He notes that 2 times a week the arguments will escalate into something physical.  Diagnosis:   DSM5: Schizophrenia Disorders:   Obsessive-Compulsive Disorders:   Trauma-Stressor Disorders:   Substance/Addictive Disorders:   Depressive Disorders:   Total Time spent with patient: 30 minutes  Axis I: Adjustment Disorder with Depressed Mood, Substance Induced Mood Disorder and Alcohol dependence   ADL's:  Impaired  Sleep: Fair  Appetite:  Fair  Suicidal Ideation:  Patient  endorses suicidal ideation but contracts for safety while in the hospital Homicidal Ideation:  Patient denies homicidal ideation, intention or plans AEB (as evidenced by):  Psychiatric Specialty Exam: Physical Exam  ROS  Blood pressure 97/73, pulse 98, temperature 98.1 F (36.7 C), temperature source Oral, resp. rate 20, height 5' 11.5" (1.816 m), weight 91.627 kg (202 lb).Body mass index is 27.78 kg/(m^2).  General Appearance: Disheveled and Guarded  Eye Contact::  Good  Speech:  Clear and Coherent and Slow  Volume:  Decreased  Mood:  Anxious, Depressed, Hopeless and Worthless  Affect:  Depressed and Flat  Thought Process:  Coherent and Goal Directed  Orientation:  Full (Time, Place, and Person)  Thought Content:  WDL  Suicidal Thoughts:  Yes.  with intent/plan  Homicidal Thoughts:  No  Memory:  Immediate;   Fair  Judgement:  Impaired  Insight:  Lacking  Psychomotor Activity:  Decreased  Concentration:  Fair  Recall:  Good  Fund of Knowledge:Good  Language: Good  Akathisia:  NA  Handed:  Right  AIMS (if indicated):     Assets:  Communication Skills Desire for Improvement Leisure Time Resilience Social Support Talents/Skills Transportation  Sleep:  Number of Hours: 6.75   Musculoskeletal: Strength & Muscle Tone: within normal limits Gait & Station: normal Patient leans: N/A  Current Medications: Current Facility-Administered Medications  Medication Dose Route Frequency Provider Last Rate Last Dose  . acetaminophen (TYLENOL) tablet 650 mg  650 mg Oral Q4H PRN Waylan Boga, NP      . alum &  mag hydroxide-simeth (MAALOX/MYLANTA) 200-200-20 MG/5ML suspension 30 mL  30 mL Oral Q4H PRN Waylan Boga, NP      . cephALEXin (KEFLEX) capsule 500 mg  500 mg Oral 3 times per day Waylan Boga, NP   500 mg at 01/30/14 6010  . chlordiazePOXIDE (LIBRIUM) capsule 25 mg  25 mg Oral Q6H PRN Waylan Boga, NP   25 mg at 01/28/14 1439  . chlordiazePOXIDE (LIBRIUM) capsule 25 mg  25 mg  Oral BH-qamhs Waylan Boga, NP       Followed by  . [START ON 02/01/2014] chlordiazePOXIDE (LIBRIUM) capsule 25 mg  25 mg Oral Daily Waylan Boga, NP      . cloNIDine (CATAPRES) tablet 0.1 mg  0.1 mg Oral BID PRN Waylan Boga, NP   0.1 mg at 01/27/14 2108  . hydrOXYzine (ATARAX/VISTARIL) tablet 25 mg  25 mg Oral Q6H PRN Waylan Boga, NP   25 mg at 01/29/14 2157  . loperamide (IMODIUM) capsule 2-4 mg  2-4 mg Oral PRN Waylan Boga, NP   4 mg at 01/28/14 2058  . magnesium hydroxide (MILK OF MAGNESIA) suspension 30 mL  30 mL Oral Daily PRN Waylan Boga, NP      . multivitamin with minerals tablet 1 tablet  1 tablet Oral Daily Waylan Boga, NP   1 tablet at 01/30/14 0801  . nicotine (NICODERM CQ - dosed in mg/24 hours) patch 21 mg  21 mg Transdermal Q0600 Benjamine Mola, FNP   21 mg at 01/30/14 0758  . ondansetron (ZOFRAN-ODT) disintegrating tablet 4 mg  4 mg Oral Q6H PRN Waylan Boga, NP      . rivaroxaban (XARELTO) tablet 20 mg  20 mg Oral Daily Waylan Boga, NP   20 mg at 01/30/14 0800  . thiamine (VITAMIN B-1) tablet 100 mg  100 mg Oral Daily Waylan Boga, NP   100 mg at 01/30/14 0800  . traZODone (DESYREL) tablet 50 mg  50 mg Oral QHS,MR X 1 Laverle Hobby, PA-C   50 mg at 01/29/14 2157    Lab Results: No results found for this or any previous visit (from the past 48 hour(s)).  Physical Findings: AIMS: Facial and Oral Movements Muscles of Facial Expression: None, normal Lips and Perioral Area: None, normal Jaw: None, normal Tongue: None, normal,Extremity Movements Upper (arms, wrists, hands, fingers): None, normal Lower (legs, knees, ankles, toes): None, normal, Trunk Movements Neck, shoulders, hips: None, normal, Overall Severity Severity of abnormal movements (highest score from questions above): None, normal Incapacitation due to abnormal movements: None, normal Patient's awareness of abnormal movements (rate only patient's report): No Awareness, Dental Status Current problems with  teeth and/or dentures?: No Does patient usually wear dentures?: No  CIWA:  CIWA-Ar Total: 1 COWS:     Treatment Plan Summary: Daily contact with patient to assess and evaluate symptoms and progress in treatment Medication management  Plan: 1. Continue with current plan of care. 2. Increase Celexa 20 mg PO Qam for depression 3. Completed EKG which is NSR 4. Will start Risperdal 0.25 mg for mood swings and anger issues. 5. Disposition is in progress.  Medical Decision Making Problem Points:  Established problem, worsening (2), New problem, with no additional work-up planned (3), Review of last therapy session (1), Review of psycho-social stressors (1) and Self-limited or minor (1) Data Points:  Review of medication regiment & side effects (2) Review of new medications or change in dosage (2)  I certify that inpatient services furnished can reasonably be expected to  improve the patient's condition.   Parke Simmers Ary Lavine 01/31/2014 1:45 PM

## 2014-01-31 NOTE — [facility] Group Notes (Signed)
[place] LCSW Group Therapy  01/31/2014 1:15 PM   Type of Therapy:  Group Therapy  Participation Level:  Did Not Attend - pt meeting with MD for a majority of group   Regan Lemming, [place] 01/31/2014 3:08 PM

## 2014-01-31 NOTE — Tx Team (Signed)
Interdisciplinary Treatment Plan Update (Adult)  Date: 01/31/2014  Time Reviewed:  9:45 AM  Progress in Treatment: Attending groups: Yes Participating in groups:  Yes Taking medication as prescribed:  Yes Tolerating medication:  Yes Family/Significant othe contact made: No, pt refused Patient understands diagnosis:  Yes Discussing patient identified problems/goals with staff:  Yes Medical problems stabilized or resolved:  Yes Denies suicidal/homicidal ideation: Yes Issues/concerns per patient self-inventory:  Yes Other:  New problem(s) identified: Med changes being made today  Discharge Plan or Barriers: Pt has follow up scheduled at [facility] for outpatient medication management and therapy.   Reason for Continuation of Hospitalization: Anxiety Depression Medication Stabilization  Comments: N/A  Estimated length of stay: 2-3 days  For review of initial/current patient goals, please see plan of care.  Attendees: Patient:     Family:     Physician:  Dr. Zorita Pang 01/31/2014 10:12 AM   Nursing:   Gaylan Gerold, RN 01/31/2014 10:12 AM   Clinical Social Worker:  Regan Lemming, LCSW 01/31/2014 10:12 AM   Other: Nena Polio, PA 01/31/2014 10:12 AM   Other:  Norberto Sorenson, care coordination 01/31/2014 10:12 AM   Other:  Darrol Angel, RN 01/31/2014 10:12 AM   Other:  Para March, RN 01/31/2014 10:13 AM   Other:    Other:    Other:    Other:    Other:    Other:     Scribe for Treatment Team:   Ane Payment, 01/31/2014 10:12 AM

## 2014-01-31 NOTE — Progress Notes (Signed)
Patient ID: [name], male   DOB: Mar 20, 1966, 48 y.o.   MRN: [id number]  D: After the introduction, writer asked the pt about his day. Pt stated, "I can't complain". Pt stated he has trouble sleeping at night. Stated, "I usually wake up in the middle of the night". Stated the, "Dr said he was going to start me dep medicine, hell I don't know". Pt states he plans to leave [facility] and go to the shelter".   A:  Support and encouragement was offered. 15 min checks continued for safety.  R: Pt remains safe.

## 2014-01-31 NOTE — Progress Notes (Signed)
Adult Psychoeducational Group Note  Date:  02/01/2014 Time:  12:00 AM  Group Topic/Focus:  Wrap-Up Group:   The focus of this group is to help patients review their daily goal of treatment and discuss progress on daily workbooks.  Participation Level:  Active  Participation Quality:  Appropriate  Affect:  Appropriate  Cognitive:  Appropriate  Insight: Appropriate  Engagement in Group:  Engaged  Modes of Intervention:  Discussion  Additional Comments:  The patient expressed that he started new medications and his day was up and down.The patient said other than that his day was good.  Thayer Dallas Gwendalyn Mcgonagle 02/01/2014, 12:00 AM

## 2014-01-31 NOTE — [facility] Group Notes (Signed)
[facility] LCSW Aftercare Discharge Planning Group Note   01/31/2014 8:45 AM  Participation Quality:  Alert, Appropriate and Oriented  Mood/Affect:  Flat and Depressed  Depression Rating:  5  Anxiety Rating:  7  Thoughts of Suicide:  Pt denies SI/HI  Will you contract for safety?   Yes  Current AVH:  Pt denies  Plan for Discharge/Comments:  Pt attended discharge planning group and actively participated in group.  CSW provided pt with today's workbook.  Pt reports his mood is improving since admission.  Pt states that he plans to stay in a shelter in [place] and utilize [facility] for support and resources such as the mailbox.  Pt states that he will follow up at [facility] for outpatient medication management and therapy.  No further needs voiced by pt at this time.    Transportation Means: Pt reports access to transportation - pt will need a bus pass  Supports: No supports mentioned at this time  Regan Lemming, [place] 01/31/2014 9:42 AM

## 2014-02-01 NOTE — Progress Notes (Signed)
Adult Psychoeducational Group Note  Date:  01/31/2014 Time:  10:00am Group Topic/Focus:  Personal Choices and Values:   The focus of this group is to help patients assess and explore the importance of values in their lives, how their values affect their decisions, how they express their values and what opposes their expression.  Participation Level:  Active  Participation Quality:  Appropriate and Attentive  Affect:  Appropriate  Cognitive:  Alert and Appropriate  Insight: Appropriate  Engagement in Group:  Engaged  Modes of Intervention:  Discussion and Education  Additional Comments:  Discussion was on Personal development and what it means to you. Pt stated personal development means building self into something better.  Gailen Shelter 02/01/2014, 9:42 AM

## 2014-02-01 NOTE — Progress Notes (Addendum)
Patient ID: [name], male   DOB: [birth year], 48 y.o.   MRN: [id number] D: Pt. Lying in bed noted none pitting edema to left leg/feet, "that's cellulitis, I have deep vein thrombosis, see the color change" pt. Pleasant, coloring. A: Writer introduced self to client, provided emotional support, encouraged him to avoid crossing of legs, tight fitting clothes, and elevate legs at bedtime. Staff will monitor q89min for safety, encouraged group. R: Pt. Is safe on unit, attended karaoke.

## 2014-02-01 NOTE — Progress Notes (Signed)
The focus of this group is to educate the patient on the purpose and policies of crisis stabilization and provide a format to answer questions about their admission.  The group details unit policies and expectations of patients while admitted.  Patient attended 0900 nurse education orientation group this morning.  Patient actively participated, appropriate affect, alert, appropriate insight and engagement.  Today patient will work on 3 goals for discharge.  

## 2014-02-01 NOTE — Progress Notes (Signed)
Patient ID: [name], male   DOB: Aug 12, 1966, 48 y.o.   MRN: [id number] [facility] MD Progress Note  02/01/2014 11:56 AM Veryl Winemiller  MRN:  [id number]  Subjective:  Harve states that he feels a little better today. He notes that he is a little less anxious today, but also not as irritable today either.  He notes that his anxiety today is a 3/10 today and says that yesterday it would have been a 7/10 at the same time of day.  He notes that he has smiled today, and is aware that he has heard things that he can identify as "would have set me off previously."  He rates his depression is a 4/10 and it's the "lowest it has ever been, since it has never been that low before, or below a 5/10." He denies SI/HI, and no AVH.   Diagnosis:   DSM5: Schizophrenia Disorders:   Obsessive-Compulsive Disorders:   Trauma-Stressor Disorders:   Substance/Addictive Disorders:   Depressive Disorders:   Total Time spent with patient: 30 minutes  Axis I: Adjustment Disorder with Depressed Mood, Substance Induced Mood Disorder and Alcohol dependence   ADL's:  Impaired  Sleep: Fair  Appetite:  Fair  Suicidal Ideation:  Patient endorses suicidal ideation but contracts for safety while in the hospital Homicidal Ideation:  Patient denies homicidal ideation, intention or plans AEB (as evidenced by):  Psychiatric Specialty Exam: Physical Exam  ROS  Blood pressure 110/75, pulse 88, temperature 97.6 F (36.4 C), temperature source Oral, resp. rate 16, height 5' 11.5" (1.816 m), weight 91.627 kg (202 lb).Body mass index is 27.78 kg/(m^2).  General Appearance: Disheveled and Guarded  Eye Contact::  Good  Speech:  Clear and Coherent and Slow  Volume:  Decreased  Mood:  Improving becoming more positive  Affect:  More animated and appropriate  Thought Process:  Coherent and Goal Directed  Orientation:  Full (Time, Place, and Person)  Thought Content:  WDL  Suicidal Thoughts:  denies  Homicidal Thoughts:  No   Memory:  Immediate;   Fair  Judgement:  Impaired  Insight:  Lacking  Psychomotor Activity:  Decreased  Concentration:  Fair  Recall:  Good  Fund of Knowledge:Good  Language: Good  Akathisia:  NA  Handed:  Right  AIMS (if indicated):     Assets:  Communication Skills Desire for Improvement Leisure Time Resilience Social Support Talents/Skills Transportation  Sleep:  Number of Hours: 6.25   Musculoskeletal: Strength & Muscle Tone: within normal limits Gait & Station: normal Patient leans: N/A  Current Medications: Current Facility-Administered Medications  Medication Dose Route Frequency Provider Last Rate Last Dose  . acetaminophen (TYLENOL) tablet 650 mg  650 mg Oral Q4H PRN Waylan Boga, NP      . alum & mag hydroxide-simeth (MAALOX/MYLANTA) 200-200-20 MG/5ML suspension 30 mL  30 mL Oral Q4H PRN Waylan Boga, NP      . cephALEXin (KEFLEX) capsule 500 mg  500 mg Oral 3 times per day Waylan Boga, NP   500 mg at 01/30/14 0626  . chlordiazePOXIDE (LIBRIUM) capsule 25 mg  25 mg Oral Q6H PRN Waylan Boga, NP   25 mg at 01/28/14 1439  . chlordiazePOXIDE (LIBRIUM) capsule 25 mg  25 mg Oral BH-qamhs Waylan Boga, NP       Followed by  . [START ON 02/01/2014] chlordiazePOXIDE (LIBRIUM) capsule 25 mg  25 mg Oral Daily Waylan Boga, NP      . cloNIDine (CATAPRES) tablet 0.1 mg  0.1 mg Oral BID  PRN Waylan Boga, NP   0.1 mg at 01/27/14 2108  . hydrOXYzine (ATARAX/VISTARIL) tablet 25 mg  25 mg Oral Q6H PRN Waylan Boga, NP   25 mg at 01/29/14 2157  . loperamide (IMODIUM) capsule 2-4 mg  2-4 mg Oral PRN Waylan Boga, NP   4 mg at 01/28/14 2058  . magnesium hydroxide (MILK OF MAGNESIA) suspension 30 mL  30 mL Oral Daily PRN Waylan Boga, NP      . multivitamin with minerals tablet 1 tablet  1 tablet Oral Daily Waylan Boga, NP   1 tablet at 01/30/14 0801  . nicotine (NICODERM CQ - dosed in mg/24 hours) patch 21 mg  21 mg Transdermal Q0600 Benjamine Mola, FNP   21 mg at 01/30/14 0758  .  ondansetron (ZOFRAN-ODT) disintegrating tablet 4 mg  4 mg Oral Q6H PRN Waylan Boga, NP      . rivaroxaban (XARELTO) tablet 20 mg  20 mg Oral Daily Waylan Boga, NP   20 mg at 01/30/14 0800  . thiamine (VITAMIN B-1) tablet 100 mg  100 mg Oral Daily Waylan Boga, NP   100 mg at 01/30/14 0800  . traZODone (DESYREL) tablet 50 mg  50 mg Oral QHS,MR X 1 Laverle Hobby, PA-C   50 mg at 01/29/14 2157    Lab Results: No results found for this or any previous visit (from the past 48 hour(s)).  Physical Findings: AIMS: Facial and Oral Movements Muscles of Facial Expression: None, normal Lips and Perioral Area: None, normal Jaw: None, normal Tongue: None, normal,Extremity Movements Upper (arms, wrists, hands, fingers): None, normal Lower (legs, knees, ankles, toes): None, normal, Trunk Movements Neck, shoulders, hips: None, normal, Overall Severity Severity of abnormal movements (highest score from questions above): None, normal Incapacitation due to abnormal movements: None, normal Patient's awareness of abnormal movements (rate only patient's report): No Awareness, Dental Status Current problems with teeth and/or dentures?: No Does patient usually wear dentures?: No  CIWA:  CIWA-Ar Total: 0 COWS:     Treatment Plan Summary: Daily contact with patient to assess and evaluate symptoms and progress in treatment Medication management  Plan: 1. Continue with current plan of care. 2. Continue Celexa 20 mg PO Qam for depression 3. Completed EKG which is NSR 4. Will start Risperdal 0.25 mg for mood swings and anger issues. 5. Disposition is in progress.  Medical Decision Making Problem Points:  Established problem, worsening (2), New problem, with no additional work-up planned (3), Review of last therapy session (1), Review of psycho-social stressors (1) and Self-limited or minor (1) Data Points:  Review of medication regiment & side effects (2) Review of new medications or change in dosage  (2)  I certify that inpatient services furnished can reasonably be expected to improve the patient's condition.  Marlane Hatcher. [place] RPAC 1:42 PM 02/01/2014  Reviewed the information documented and agree with the treatment plan.  Parke Simmers Hanni Milford 02/02/2014 1:13 PM

## 2014-02-01 NOTE — Progress Notes (Signed)
Adult Psychoeducational Group Note  Date:  02/01/2014 Time:  10:00am  Group Topic/Focus:  Making Healthy Choices:   The focus of this group is to help patients identify negative/unhealthy choices they were using prior to admission and identify positive/healthier coping strategies to replace them upon discharge.  Participation Level:  Active  Participation Quality:  Appropriate and Attentive  Affect:  Appropriate  Cognitive:  Alert and Appropriate  Insight: Appropriate  Engagement in Group:  Engaged  Modes of Intervention:  Discussion and Education  Additional Comments:  Discussion was on lifestyle changes and what is one  Change you can make to improve your life? Pt stated to control frustration.  Gailen Shelter 02/01/2014, 2:01 PM

## 2014-02-01 NOTE — [facility] Group Notes (Signed)
[facility] LCSW Group Therapy  02/01/2014   1:15 PM   Type of Therapy:  Group Therapy  Participation Level:  Active  Participation Quality:  Attentive, Sharing and Supportive  Affect:  Depressed and Flat  Cognitive:  Alert and Oriented  Insight:  Developing/Improving and Engaged  Engagement in Therapy:  Developing/Improving and Engaged  Modes of Intervention:  Activity, Clarification, Confrontation, Discussion, Education, Exploration, Limit-setting, Orientation, Problem-solving, Rapport Building, Reality Testing, Socialization and Support  Summary of Progress/Problems: Patient was attentive and engaged with speaker from [employer].  Patient was attentive to speaker while they shared their story of dealing with mental health and overcoming it.  Patient expressed interest in their programs and services and received information on their agency.  Patient processed ways they can relate to the speaker.     Zarahi Fuerst Horton, LCSW 02/01/2014  

## 2014-02-01 NOTE — Progress Notes (Signed)
D:  Patient's self inventory sheet, patient sleeps well, needs medication for sleep, good appetite, low energy level, good attention span.  Rated depression 4, hopeless 5, anxiety 3.  Has felt agitation in the past 24 hours.  Has experienced left leg cellulitis.  Worst pain 3, zero pain goal.  Plans to stay on medication, keep all appointments with outpatient.  Would like to reschedule another appointment with [facility].  May take time to get medicare again, needs to go back to cancer center for meds asap. A:  Medications administered per MD orders.  Emotional support and encouragement given patient. R:  Denied SI and HI.  Denied A/V hallucinations.  Will continue to monitor patient for safety with 15 minute checks.  Safety maintained.

## 2014-02-01 NOTE — Progress Notes (Signed)
Patient ID: [name], male   DOB: 1966-03-01, 48 y.o.   MRN: [id number]  D: Writer asked the pt to describe his day. Initially pt had difficulty verbalizing his thoughts. However, pt eventually stated that, he asked his Dr about everything and the dr said he would "probably get out Friday".  Then pt stared at RN and asked "weren't you wearing white uniform yesterday?" Writer explained that nurses can wear white or ceil blue.  A:  Support and encouragement was offered. 15 min checks continued for safety.  R: Pt remains safe.

## 2014-02-02 ENCOUNTER — Telehealth: Payer: Self-pay | Admitting: *Deleted

## 2014-02-02 ENCOUNTER — Telehealth: Payer: Self-pay | Admitting: Hematology and Oncology

## 2014-02-02 DIAGNOSIS — F1910 Other psychoactive substance abuse, uncomplicated: Secondary | ICD-10-CM

## 2014-02-02 MED ORDER — CITALOPRAM HYDROBROMIDE 20 MG PO TABS: 20.0000 mg | ORAL_TABLET | Freq: Every day | ORAL | Status: DC

## 2014-02-02 MED ORDER — CEPHALEXIN 500 MG PO CAPS: 500.0000 mg | ORAL_CAPSULE | Freq: Three times a day (TID) | ORAL | Status: DC

## 2014-02-02 MED ORDER — RISPERIDONE 0.25 MG PO TABS: 0.2500 mg | ORAL_TABLET | Freq: Two times a day (BID) | ORAL | Status: DC

## 2014-02-02 MED ORDER — RIVAROXABAN 20 MG PO TABS: 20.0000 mg | ORAL_TABLET | Freq: Every day | ORAL | Status: DC

## 2014-02-02 MED ORDER — TRAZODONE HCL 50 MG PO TABS: ORAL_TABLET | ORAL | Status: DC

## 2014-02-02 NOTE — Telephone Encounter (Signed)
Now note an appointment has been scheduled.  Patient at scheduling while this nurse looking for patient in lobby.  Awaiting return call.

## 2014-02-02 NOTE — Telephone Encounter (Signed)
pt stopped by to r/s 5/6 appt. pt given new appt for 6/3 lb/NG 9:45am.

## 2014-02-02 NOTE — Progress Notes (Signed)
Adult Psychoeducational Group Note  Date:  02/02/2014 Time:  2:32 PM  Group Topic/Focus:  Relapse Prevention Planning:   The focus of this group is to define relapse and discuss the need for planning to combat relapse.  Participation Level:  Active  Participation Quality:  Appropriate  Affect:  Appropriate  Cognitive:  Appropriate  Insight: Appropriate  Engagement in Group:  Engaged  Modes of Intervention:  Discussion  Additional Comments:  Pt attended group this morning. Pt was appropriate and participate in group.    [name] A Katrina Brosh 02/02/2014,  

## 2014-02-02 NOTE — Progress Notes (Signed)
[facility] Adult Case Management Discharge Plan :  Will you be returning to the same living situation after discharge: Yes,  Patient is returning to his home. At discharge, do you have transportation home?:Yes,  Patient to arrange transportation. Do you have the ability to pay for your medications:No.Patient needs assistance with indigent medications.  Release of information consent forms completed and in the chart;  Patient's signature needed at discharge.  Patient to Follow up at: Follow-up Information   Follow up with [facility] On 02/05/2014. (Please go to [facility]'s walk in clinic on Monday, June 1, 2-015 or any weekday between [place] for medication management)    Contact information:   [address] [place], [place]   [address]  [phone]      Patient denies SI/HI:   Patient no longer endorsing SI/HI or other thoughts of self harm.   Safety Planning and Suicide Prevention discussed:  .Reviewed with all patients during discharge planning group   [place] 02/02/2014, 8:36 AM

## 2014-02-02 NOTE — [facility] Suicide Risk Assessment (Signed)
   Demographic Factors:  Male, Adolescent or young adult, Caucasian, Low socioeconomic status, Living alone and Unemployed  Total Time spent with patient: 30 minutes  Psychiatric Specialty Exam: Physical Exam  ROS  Blood pressure 112/80, pulse 111, temperature 98 F (36.7 C), temperature source Oral, resp. rate 17, height 5' 11.5" (1.816 m), weight 91.627 kg (202 lb).Body mass index is 27.78 kg/(m^2).  General Appearance: Casual  Eye Contact::  Good  Speech:  Clear and Coherent  Volume:  Normal  Mood:  Euthymic  Affect:  Appropriate and Congruent  Thought Process:  Coherent and Goal Directed  Orientation:  Full (Time, Place, and Person)  Thought Content:  WDL  Suicidal Thoughts:  No  Homicidal Thoughts:  No  Memory:  Immediate;   Good  Judgement:  Good  Insight:  Good  Psychomotor Activity:  Normal  Concentration:  Good  Recall:  Good  Fund of Knowledge:Good  Language: Good  Akathisia:  NA  Handed:  Right  AIMS (if indicated):     Assets:  Communication Skills Desire for Improvement Leisure Time Physical Health Resilience Social Support Talents/Skills Transportation  Sleep:  Number of Hours: 7    Musculoskeletal: Strength & Muscle Tone: within normal limits Gait & Station: normal Patient leans: N/A   Mental Status Per Nursing Assessment::   On Admission:     Current Mental Status by Physician: NA  Loss Factors: Financial problems/change in socioeconomic status  Historical Factors: Impulsivity  Risk Reduction Factors:   Sense of responsibility to family, Religious beliefs about death, Living with another person, especially a relative, Positive social support, Positive therapeutic relationship and Positive coping skills or problem solving skills  Continued Clinical Symptoms:  Alcohol/Substance Abuse/Dependencies Previous Psychiatric Diagnoses and Treatments  Cognitive Features That Contribute To Risk:  Polarized thinking    Suicide Risk:   Minimal: No identifiable suicidal ideation.  Patients presenting with no risk factors but with morbid ruminations; may be classified as minimal risk based on the severity of the depressive symptoms  Discharge Diagnoses:   AXIS I:  Substance Abuse and Substance Induced Mood Disorder AXIS II:  Deferred AXIS III:   Past Medical History  Diagnosis Date  . Coronary artery disease   . Degenerative joint disease of spine   . Pulmonary embolism 06/2010  . Degenerative joint disease   . 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 ETOH abuse   . DVT (deep venous thrombosis)   . Hepatitis B   . Mental disorder   . Depression    AXIS IV:  economic problems, housing problems, occupational problems, other psychosocial or environmental problems, problems related to social environment and problems with primary support group AXIS V:  61-70 mild symptoms  Plan Of Care/Follow-up recommendations:  Activity: As tolerated Diet: Regular  Is patient on multiple antipsychotic therapies at discharge:  No   Has Patient had three or more failed trials of antipsychotic monotherapy by history:  No  Recommended Plan for Multiple Antipsychotic Therapies: NA    Janardhaha R Marthe Dant 02/02/2014, 12:47 PM

## 2014-02-02 NOTE — Telephone Encounter (Signed)
Walk-in form received from reads "missed last oncology appointment.  Need prescription for xarelto."  Spoke with Dr. Alvy Bimler.  Verbal order to continue xarelto his lab looks good and ask Pharmacy for patient assistance.  Would like to see patient 03-02-2014 at 3:15 pm with no lab.  Called patient's name in [facility] main lobby at [phone] and [phone].  No answer.  Called contact Ericka Pontiff and was given [phone] to reach patient.  Message left at 1500 requesting return call.  Also given Alvin's brother's number to reach patient.  Called [phone] at [phone] instructing "Pee-Wee" to ask Brannen to call the office.

## 2014-02-02 NOTE — [facility] Group Notes (Signed)
[facility] LCSW Aftercare Discharge Planning Group Note   02/02/2014 10:06 AM    Participation Quality:  Appropraite  Mood/Affect:  Appropriate  Depression Rating:  1  Anxiety Rating:  3  Thoughts of Suicide:  No  Will you contract for safety?   NA  Current AVH:  No  Plan for Discharge/Comments:  Patient attended discharge planning group and actively participated in group.  He reports doing well and ready to discharge home today.  Patient will follow up with [facility].  CSW provided all participants with daily workbook.   Transportation Means: Patient has transportation.   Supports:  Patient has a support system.   [name], [name]
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
## 2014-02-02 NOTE — Tx Team (Signed)
Interdisciplinary Treatment Plan Update   Date Reviewed:  02/02/2014  Time Reviewed:  8:34 AM  Progress in Treatment:   Attending groups: Yes Participating in groups: Yes Taking medication as prescribed: Yes  Tolerating medication: Yes Family/Significant other contact made:  No, patient declined collateral contact Patient understands diagnosis: Yes  Discussing patient identified problems/goals with staff: Yes Medical problems stabilized or resolved: Yes Denies suicidal/homicidal ideation: Yes Patient has not harmed self or others: Yes  For review of initial/current patient goals, please see plan of care.  Estimated Length of Stay:  Discharge today  Reasons for Continued Hospitalization:   New Problems/Goals identified:    Discharge Plan or Barriers:   Home with outpatient follow up at [facility]  Additional Comments:  Attendees:  Patient:  [name] 02/02/2014 8:34 AM   Signature: Mylinda Latina, MD 02/02/2014 8:34 AM  Signature:  Nena Polio, PA 02/02/2014 8:34 AM  Signature:  Briscoe Deutscher, RN 02/02/2014 8:34 AM  Signature:Beverly Danelle Earthly, RN 02/02/2014 8:34 AM  Signature: Drake Leach, RN 02/02/2014 8:34 AM  Signature:  Joette Catching, LCSW 02/02/2014 8:34 AM  Signature:  Regan Lemming, LCSW 02/02/2014 8:34 AM  Signature:  Lucinda Dell, Care Coordinator [facility] 02/02/2014 8:34 AM  Signature:  02/02/2014 8:34 AM  Signature:  02/02/2014  8:34 AM  Signature:   Lars Pinks, RN [facility] 02/02/2014  8:34 AM  Signature: 02/02/2014  8:34 AM    Scribe for Treatment Team:   Joette Catching,  02/02/2014 8:34 AM

## 2014-02-02 NOTE — Progress Notes (Signed)
D:  Patient's self inventory sheet, patient has fair sleep, needs sleep medication, good appetite, normal energy level, good attention span.l  Rated depression #1, anxiety and hopeless #3.  Has felt sedated.  Denied SI.  Worst pain #3, pain goal #3.  Does have discharge plans.  No problems taking meds after discharge.   A:  Medications administered per MD orders.  Emotional support and encouragement given patient. R:  Denied SI and HI, contracts for safety.  Denied A/V hallucinations.  Will continue to monitor patient for safety with 15 minute checks.  Safety maintained.

## 2014-02-02 NOTE — Progress Notes (Signed)
Discharge Note:  Patient discharged with bus tickets.  Denied SI and HI.  Denied A/V hallucinations.  Suicide prevention information given and reviewed with patient who stated he understood and had no questions.  Patient stated he received all his belongings, coat, bag, shoes, cigs, lighter, clothing, prescriptions, medications, cap, toiletries, misc items.  Patient stated he appreciated all assistance received from [facility] staff.

## 2014-02-06 NOTE — Progress Notes (Signed)
Patient Discharge Instructions:  After Visit Summary (AVS):   Faxed to:  02/06/14 Discharge Summary Note:   Faxed to:  02/06/14 Psychiatric Admission Assessment Note:   Faxed to:  02/06/14 Suicide Risk Assessment - Discharge Assessment:   Faxed to:  02/06/14 Faxed/Sent to the Next Level Care provider:  02/06/14 Faxed to [facility] [place], 02/06/2014, 3:49 PM

## 2014-02-07 ENCOUNTER — Other Ambulatory Visit (HOSPITAL_BASED_OUTPATIENT_CLINIC_OR_DEPARTMENT_OTHER): Payer: Medicaid Other

## 2014-02-07 ENCOUNTER — Encounter: Payer: Self-pay | Admitting: Hematology and Oncology

## 2014-02-07 ENCOUNTER — Telehealth: Payer: Self-pay | Admitting: Hematology and Oncology

## 2014-02-07 ENCOUNTER — Ambulatory Visit (HOSPITAL_BASED_OUTPATIENT_CLINIC_OR_DEPARTMENT_OTHER): Payer: Medicaid Other | Admitting: Hematology and Oncology

## 2014-02-07 VITALS — BP 159/85 | HR 92 | Temp 98.9°F | Resp 20 | Ht 71.5 in | Wt 204.1 lb

## 2014-02-07 DIAGNOSIS — I82409 Acute embolism and thrombosis of unspecified deep veins of unspecified lower extremity: Secondary | ICD-10-CM

## 2014-02-07 DIAGNOSIS — L0390 Cellulitis, unspecified: Secondary | ICD-10-CM

## 2014-02-07 DIAGNOSIS — L0291 Cutaneous abscess, unspecified: Secondary | ICD-10-CM

## 2014-02-07 DIAGNOSIS — Z720 Tobacco use: Secondary | ICD-10-CM

## 2014-02-07 DIAGNOSIS — F1020 Alcohol dependence, uncomplicated: Secondary | ICD-10-CM

## 2014-02-07 DIAGNOSIS — D6862 Lupus anticoagulant syndrome: Secondary | ICD-10-CM

## 2014-02-07 DIAGNOSIS — I82403 Acute embolism and thrombosis of unspecified deep veins of lower extremity, bilateral: Secondary | ICD-10-CM

## 2014-02-07 DIAGNOSIS — F17200 Nicotine dependence, unspecified, uncomplicated: Secondary | ICD-10-CM

## 2014-02-07 DIAGNOSIS — F1010 Alcohol abuse, uncomplicated: Secondary | ICD-10-CM

## 2014-02-07 DIAGNOSIS — D6859 Other primary thrombophilia: Secondary | ICD-10-CM

## 2014-02-07 DIAGNOSIS — I2699 Other pulmonary embolism without acute cor pulmonale: Secondary | ICD-10-CM

## 2014-02-07 LAB — COMPREHENSIVE METABOLIC PANEL (CC13)
ALBUMIN: 4 g/dL (ref 3.5–5.0)
ALT: 21 U/L (ref 0–55)
ANION GAP: 15 meq/L — AB (ref 3–11)
AST: 25 U/L (ref 5–34)
Alkaline Phosphatase: 72 U/L (ref 40–150)
BUN: 8.4 mg/dL (ref 7.0–26.0)
CALCIUM: 9.1 mg/dL (ref 8.4–10.4)
CHLORIDE: 108 meq/L (ref 98–109)
CO2: 18 mEq/L — ABNORMAL LOW (ref 22–29)
Creatinine: 0.8 mg/dL (ref 0.7–1.3)
Glucose: 97 mg/dl (ref 70–140)
POTASSIUM: 4.5 meq/L (ref 3.5–5.1)
Sodium: 141 mEq/L (ref 136–145)
Total Bilirubin: 0.33 mg/dL (ref 0.20–1.20)
Total Protein: 7.5 g/dL (ref 6.4–8.3)

## 2014-02-07 LAB — CBC WITH DIFFERENTIAL/PLATELET
BASO%: 0.8 % (ref 0.0–2.0)
BASOS ABS: 0.1 10*3/uL (ref 0.0–0.1)
EOS%: 2.3 % (ref 0.0–7.0)
Eosinophils Absolute: 0.2 10*3/uL (ref 0.0–0.5)
HEMATOCRIT: 42.4 % (ref 38.4–49.9)
HGB: 14.3 g/dL (ref 13.0–17.1)
LYMPH%: 17 % (ref 14.0–49.0)
MCH: 33.8 pg — ABNORMAL HIGH (ref 27.2–33.4)
MCHC: 33.7 g/dL (ref 32.0–36.0)
MCV: 100.2 fL — ABNORMAL HIGH (ref 79.3–98.0)
MONO#: 0.9 10*3/uL (ref 0.1–0.9)
MONO%: 10.7 % (ref 0.0–14.0)
NEUT%: 69.2 % (ref 39.0–75.0)
NEUTROS ABS: 5.5 10*3/uL (ref 1.5–6.5)
Platelets: 236 10*3/uL (ref 140–400)
RBC: 4.23 10*6/uL (ref 4.20–5.82)
RDW: 14.1 % (ref 11.0–14.6)
WBC: 8 10*3/uL (ref 4.0–10.3)
lymph#: 1.4 10*3/uL (ref 0.9–3.3)

## 2014-02-07 MED ORDER — RIVAROXABAN 20 MG PO TABS: 20.0000 mg | ORAL_TABLET | Freq: Every day | ORAL | Status: DC

## 2014-02-07 NOTE — Telephone Encounter (Signed)
gv adn printed appt scehd and avs for pt for DEc

## 2014-02-07 NOTE — Progress Notes (Signed)
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 Patient is lupus anticoagulant positive. Patient has a history of pulmonary embolism in October 2011. Patient was very noncompliant with Coumadin monitoring due to lack of transportation and being homeless. S/P IVC filter placement in October 2011. He was started on Xarelto in April 2014 but this was stopped due to propagation of his left lower extremity DVT. He was placed on Lovenox 90 mg twice a day started in April 2014. Most recently, since October 2014, his anticoagulation therapy was switched to Xarelto due to cost issues.   INTERVAL HISTORY: [name] 48 y.o. male returns for follow up on a history of recurrent DVT. The patient did not show up on his most recent visit and was seen in the ER again. He has cellulitis and is put on some antibiotic therapy with improvement. The patient is very disturbed because he lost his medical record recently and is afraid he may not be able to afford his prescription anticoagulation therapy. He states that he has been compliant taking his medication. The patient denies any recent signs or symptoms of bleeding such as spontaneous epistaxis, hematuria or hematochezia. He continued to smoke. I have reviewed the past medical history, past surgical history, social history and family history with the patient and they are unchanged from previous note.  ALLERGIES:  has No Known Allergies.  MEDICATIONS:  Current Outpatient Prescriptions  Medication Sig Dispense Refill  . cephALEXin (KEFLEX) 500 MG capsule Take 1 capsule (500 mg total) by mouth every 8 (eight) hours. For Cellulitis      . citalopram (CELEXA) 20 MG tablet Take 1 tablet (20 mg total) by mouth daily.  30 tablet  0   . risperiDONE (RISPERDAL) 0.25 MG tablet Take 1 tablet (0.25 mg total) by mouth 2 (two) times daily.  60 tablet  0  . rivaroxaban (XARELTO) 20 MG TABS tablet Take 1 tablet (20 mg total) by mouth daily with supper.  30 tablet  0  . traZODone (DESYREL) 50 MG tablet Take one tablet at bedtime if needed for insomnia.  30 tablet  0  . rivaroxaban (XARELTO) 20 MG TABS tablet Take 1 tablet (20 mg total) by mouth daily with supper.  30 tablet  6   No current facility-administered medications for this visit.     REVIEW OF SYSTEMS:   Constitutional: Denies fevers, chills or night sweats Eyes: Denies blurriness of vision Ears, nose, mouth, throat, and face: Denies mucositis or sore throat Respiratory: Denies cough, dyspnea or wheezes Cardiovascular: Denies palpitation, chest discomfort Gastrointestinal:  Denies nausea, heartburn or change in bowel habits Lymphatics: Denies new lymphadenopathy or easy bruising Neurological:Denies numbness, tingling or new weaknesses Behavioral/Psych: Mood is stable, no new changes  All other systems were reviewed with the patient and are negative.  PHYSICAL EXAMINATION: ECOG PERFORMANCE STATUS: 1 - Symptomatic but completely ambulatory  Filed Vitals:   02/07/14 1025  BP: 159/85  Pulse: 92  Temp: 98.9 F (37.2 C)  Resp: 20   Filed Weights   02/07/14 1025  Weight: 204 lb 1.6 oz (92.579 kg)    GENERAL:alert, no distress and comfortable. He is unkempt SKIN: No cellulitis in the left leg worse in the right. There is no ulceration noted. EYES: normal, Conjunctiva are pink and non-injected, sclera clear OROPHARYNX:  Very poor dentition is noted.  Significant bilateral lower extremity edema, left greater than right NEURO: alert & oriented x 3 with fluent speech, no focal motor/sensory deficits  LABORATORY DATA:  I have reviewed the data as listed Results for orders placed in visit on 02/07/14 (from the past 48 hour(s))  CBC WITH DIFFERENTIAL     Status:  Abnormal   Collection Time    02/07/14 10:09 AM      Result Value Ref Range   WBC 8.0  4.0 - 10.3 10e3/uL   NEUT# 5.5  1.5 - 6.5 10e3/uL   HGB 14.3  13.0 - 17.1 g/dL   HCT 42.4  38.4 - 49.9 %   Platelets 236  140 - 400 10e3/uL   MCV 100.2 (*) 79.3 - 98.0 fL   MCH 33.8 (*) 27.2 - 33.4 pg   MCHC 33.7  32.0 - 36.0 g/dL   RBC 4.23  4.20 - 5.82 10e6/uL   RDW 14.1  11.0 - 14.6 %   lymph# 1.4  0.9 - 3.3 10e3/uL   MONO# 0.9  0.1 - 0.9 10e3/uL   Eosinophils Absolute 0.2  0.0 - 0.5 10e3/uL   Basophils Absolute 0.1  0.0 - 0.1 10e3/uL   NEUT% 69.2  39.0 - 75.0 %   LYMPH% 17.0  14.0 - 49.0 %   MONO% 10.7  0.0 - 14.0 %   EOS% 2.3  0.0 - 7.0 %   BASO% 0.8  0.0 - 2.0 %  COMPREHENSIVE METABOLIC PANEL (HY07)     Status: Abnormal   Collection Time    02/07/14 10:09 AM      Result Value Ref Range   Sodium 141  136 - 145 mEq/L   Potassium 4.5  3.5 - 5.1 mEq/L   Chloride 108  98 - 109 mEq/L   CO2 18 (*) 22 - 29 mEq/L   Glucose 97  70 - 140 mg/dl   BUN 8.4  7.0 - 26.0 mg/dL   Creatinine 0.8  0.7 - 1.3 mg/dL   Total Bilirubin 0.33  0.20 - 1.20 mg/dL   Alkaline Phosphatase 72  40 - 150 U/L   AST 25  5 - 34 U/L   ALT 21  0 - 55 U/L   Total Protein 7.5  6.4 - 8.3 g/dL   Albumin 4.0  3.5 - 5.0 g/dL   Calcium 9.1  8.4 - 10.4 mg/dL   Anion Gap 15 (*) 3 - 11 mEq/L    Lab Results  Component Value Date   WBC 8.0 02/07/2014   HGB 14.3 02/07/2014   HCT 42.4 02/07/2014   MCV 100.2* 02/07/2014   PLT 236 02/07/2014   ASSESSMENT & PLAN:  #1 recurrent DVT and PE This is due to history of noncompliance. He also tested positive for lupus anticoagulant in the past. Duration of therapy is life long. He has been tried on various options and at present time Xarelto is the best option for him due to cost issue I consider to encourage him compliance. I gave him a prescription with multiple refills for 6 Months. I will continue to see him every 6 months with history, physical examination, with lab work #2 heavy  smoking I encouraged the patient to quit smoking. He is not interested to quit. #3 significant alcoholism The patient has significant alcoholism and continued to drink heavily. I encouraged him to quit drinking and again the patient is not interested #4 poor social circumstances with financial issues He has transportation  issues and I'm getting our Education officer, museum to help. [place] to go to a shelter. Our financial navigator managed to get him help to pay for his medication #5 cellulitis I do not know what the baseline looks like. I recommended completes his prescription antibiotic therapy. All questions were answered. The patient knows to call the clinic with any problems, questions or concerns. No barriers to learning was detected.  I spent 25 minutes counseling the patient face to face. The total time spent in the appointment was 30 minutes and more than 50% was on counseling.     Heath Lark, MD 02/07/2014 7:34 PM

## 2014-02-07 NOTE — Telephone Encounter (Signed)
02-07-2014 at 11:30 am patient with financial counselor, Rachael.  Xarelto free trial card given to patient during this visit.  Discount card cannot be used by patients on Medicare or Medicaid.

## 2014-04-23 ENCOUNTER — Other Ambulatory Visit (HOSPITAL_COMMUNITY): Payer: Self-pay | Admitting: Physician Assistant

## 2014-06-05 ENCOUNTER — Inpatient Hospital Stay (HOSPITAL_COMMUNITY)
Admission: EM | Admit: 2014-06-05 | Discharge: 2014-06-07 | DRG: 300 | Disposition: A | Discharge status: Home / Self Care | Payer: Medicaid Other | Attending: Internal Medicine | Admitting: Internal Medicine

## 2014-06-05 ENCOUNTER — Encounter (HOSPITAL_COMMUNITY): Payer: Self-pay | Admitting: Emergency Medicine

## 2014-06-05 DIAGNOSIS — Z720 Tobacco use: Secondary | ICD-10-CM

## 2014-06-05 DIAGNOSIS — I87009 Postthrombotic syndrome without complications of unspecified extremity: Secondary | ICD-10-CM

## 2014-06-05 DIAGNOSIS — Z91138 Patient's unintentional underdosing of medication regimen for other reason: Secondary | ICD-10-CM | POA: Diagnosis present

## 2014-06-05 DIAGNOSIS — I82403 Acute embolism and thrombosis of unspecified deep veins of lower extremity, bilateral: Secondary | ICD-10-CM

## 2014-06-05 DIAGNOSIS — F1010 Alcohol abuse, uncomplicated: Secondary | ICD-10-CM | POA: Diagnosis present

## 2014-06-05 DIAGNOSIS — I82532 Chronic embolism and thrombosis of left popliteal vein: Secondary | ICD-10-CM | POA: Diagnosis present

## 2014-06-05 DIAGNOSIS — I82542 Chronic embolism and thrombosis of left tibial vein: Secondary | ICD-10-CM | POA: Diagnosis present

## 2014-06-05 DIAGNOSIS — Z5900 Homelessness unspecified: Secondary | ICD-10-CM

## 2014-06-05 DIAGNOSIS — M1990 Unspecified osteoarthritis, unspecified site: Secondary | ICD-10-CM | POA: Diagnosis present

## 2014-06-05 DIAGNOSIS — I2510 Atherosclerotic heart disease of native coronary artery without angina pectoris: Secondary | ICD-10-CM | POA: Diagnosis present

## 2014-06-05 DIAGNOSIS — F99 Mental disorder, not otherwise specified: Secondary | ICD-10-CM | POA: Diagnosis present

## 2014-06-05 DIAGNOSIS — Z23 Encounter for immunization: Secondary | ICD-10-CM

## 2014-06-05 DIAGNOSIS — Z86711 Personal history of pulmonary embolism: Secondary | ICD-10-CM

## 2014-06-05 DIAGNOSIS — B1910 Unspecified viral hepatitis B without hepatic coma: Secondary | ICD-10-CM | POA: Diagnosis present

## 2014-06-05 DIAGNOSIS — Z7901 Long term (current) use of anticoagulants: Secondary | ICD-10-CM

## 2014-06-05 DIAGNOSIS — I82402 Acute embolism and thrombosis of unspecified deep veins of left lower extremity: Secondary | ICD-10-CM | POA: Diagnosis present

## 2014-06-05 DIAGNOSIS — L03116 Cellulitis of left lower limb: Secondary | ICD-10-CM

## 2014-06-05 DIAGNOSIS — M479 Spondylosis, unspecified: Secondary | ICD-10-CM | POA: Diagnosis present

## 2014-06-05 DIAGNOSIS — F329 Major depressive disorder, single episode, unspecified: Secondary | ICD-10-CM | POA: Diagnosis present

## 2014-06-05 DIAGNOSIS — T50996A Underdosing of other drugs, medicaments and biological substances, initial encounter: Secondary | ICD-10-CM | POA: Diagnosis present

## 2014-06-05 DIAGNOSIS — D6862 Lupus anticoagulant syndrome: Secondary | ICD-10-CM | POA: Diagnosis present

## 2014-06-05 DIAGNOSIS — I82512 Chronic embolism and thrombosis of left femoral vein: Principal | ICD-10-CM | POA: Diagnosis present

## 2014-06-05 DIAGNOSIS — R319 Hematuria, unspecified: Secondary | ICD-10-CM

## 2014-06-05 DIAGNOSIS — R791 Abnormal coagulation profile: Secondary | ICD-10-CM

## 2014-06-05 LAB — I-STAT CHEM 8, ED
BUN: 3 mg/dL — AB (ref 6–23)
CHLORIDE: 110 meq/L (ref 96–112)
CREATININE: 1 mg/dL (ref 0.50–1.35)
Calcium, Ion: 1.03 mmol/L — ABNORMAL LOW (ref 1.12–1.23)
Glucose, Bld: 89 mg/dL (ref 70–99)
HCT: 46 % (ref 39.0–52.0)
Hemoglobin: 15.6 g/dL (ref 13.0–17.0)
POTASSIUM: 4.2 meq/L (ref 3.7–5.3)
SODIUM: 142 meq/L (ref 137–147)
TCO2: 21 mmol/L (ref 0–100)

## 2014-06-05 NOTE — ED Notes (Addendum)
Pt states he drinks every day, last drink was a 40 oz beer at 1600.

## 2014-06-05 NOTE — ED Notes (Signed)
Pt showed back up in the lobby after being upstairs visiting

## 2014-06-05 NOTE — ED Notes (Signed)
Pt still not in the lobby

## 2014-06-05 NOTE — ED Notes (Signed)
Per EMS pt with Hx of DVT reports to ED for left lower leg pain, swelling, and erythema. ETOH on board. Denies SOB.

## 2014-06-05 NOTE — ED Notes (Signed)
Pt called to be placed in a room, no answer, he went to ICU to visit a patient

## 2014-06-06 ENCOUNTER — Encounter (HOSPITAL_COMMUNITY): Payer: Self-pay | Admitting: Emergency Medicine

## 2014-06-06 ENCOUNTER — Emergency Department (HOSPITAL_COMMUNITY): Payer: Medicaid Other

## 2014-06-06 DIAGNOSIS — I82409 Acute embolism and thrombosis of unspecified deep veins of unspecified lower extremity: Secondary | ICD-10-CM

## 2014-06-06 DIAGNOSIS — F1010 Alcohol abuse, uncomplicated: Secondary | ICD-10-CM

## 2014-06-06 DIAGNOSIS — Z720 Tobacco use: Secondary | ICD-10-CM | POA: Diagnosis not present

## 2014-06-06 DIAGNOSIS — F99 Mental disorder, not otherwise specified: Secondary | ICD-10-CM | POA: Diagnosis present

## 2014-06-06 DIAGNOSIS — D6862 Lupus anticoagulant syndrome: Secondary | ICD-10-CM | POA: Diagnosis present

## 2014-06-06 DIAGNOSIS — F329 Major depressive disorder, single episode, unspecified: Secondary | ICD-10-CM | POA: Diagnosis present

## 2014-06-06 DIAGNOSIS — I2510 Atherosclerotic heart disease of native coronary artery without angina pectoris: Secondary | ICD-10-CM | POA: Diagnosis present

## 2014-06-06 DIAGNOSIS — M479 Spondylosis, unspecified: Secondary | ICD-10-CM | POA: Diagnosis present

## 2014-06-06 DIAGNOSIS — M1990 Unspecified osteoarthritis, unspecified site: Secondary | ICD-10-CM | POA: Diagnosis present

## 2014-06-06 DIAGNOSIS — Z7901 Long term (current) use of anticoagulants: Secondary | ICD-10-CM | POA: Diagnosis not present

## 2014-06-06 DIAGNOSIS — Z91138 Patient's unintentional underdosing of medication regimen for other reason: Secondary | ICD-10-CM | POA: Diagnosis present

## 2014-06-06 DIAGNOSIS — I82512 Chronic embolism and thrombosis of left femoral vein: Secondary | ICD-10-CM | POA: Diagnosis present

## 2014-06-06 DIAGNOSIS — I82532 Chronic embolism and thrombosis of left popliteal vein: Secondary | ICD-10-CM | POA: Diagnosis present

## 2014-06-06 DIAGNOSIS — Z23 Encounter for immunization: Secondary | ICD-10-CM | POA: Diagnosis not present

## 2014-06-06 DIAGNOSIS — T50996A Underdosing of other drugs, medicaments and biological substances, initial encounter: Secondary | ICD-10-CM | POA: Diagnosis present

## 2014-06-06 DIAGNOSIS — Z86711 Personal history of pulmonary embolism: Secondary | ICD-10-CM | POA: Diagnosis not present

## 2014-06-06 DIAGNOSIS — I82402 Acute embolism and thrombosis of unspecified deep veins of left lower extremity: Secondary | ICD-10-CM | POA: Diagnosis present

## 2014-06-06 DIAGNOSIS — I82542 Chronic embolism and thrombosis of left tibial vein: Secondary | ICD-10-CM | POA: Diagnosis present

## 2014-06-06 DIAGNOSIS — M7989 Other specified soft tissue disorders: Secondary | ICD-10-CM | POA: Diagnosis present

## 2014-06-06 DIAGNOSIS — Z590 Homelessness: Secondary | ICD-10-CM | POA: Diagnosis not present

## 2014-06-06 DIAGNOSIS — B1910 Unspecified viral hepatitis B without hepatic coma: Secondary | ICD-10-CM | POA: Diagnosis present

## 2014-06-06 LAB — CBC WITH DIFFERENTIAL/PLATELET
Basophils Absolute: 0 10*3/uL (ref 0.0–0.1)
Basophils Relative: 0 % (ref 0–1)
Eosinophils Absolute: 0.1 10*3/uL (ref 0.0–0.7)
Eosinophils Relative: 2 % (ref 0–5)
HCT: 42.1 % (ref 39.0–52.0)
Hemoglobin: 14.3 g/dL (ref 13.0–17.0)
LYMPHS ABS: 1.9 10*3/uL (ref 0.7–4.0)
LYMPHS PCT: 40 % (ref 12–46)
MCH: 34 pg (ref 26.0–34.0)
MCHC: 34 g/dL (ref 30.0–36.0)
MCV: 100 fL (ref 78.0–100.0)
Monocytes Absolute: 0.3 10*3/uL (ref 0.1–1.0)
Monocytes Relative: 7 % (ref 3–12)
NEUTROS ABS: 2.4 10*3/uL (ref 1.7–7.7)
NEUTROS PCT: 51 % (ref 43–77)
PLATELETS: 179 10*3/uL (ref 150–400)
RBC: 4.21 MIL/uL — AB (ref 4.22–5.81)
RDW: 15 % (ref 11.5–15.5)
WBC: 4.8 10*3/uL (ref 4.0–10.5)

## 2014-06-06 LAB — PROTIME-INR
INR: 1.02 (ref 0.00–1.49)
INR: 1.06 (ref 0.00–1.49)
PROTHROMBIN TIME: 13.4 s (ref 11.6–15.2)
Prothrombin Time: 13.8 seconds (ref 11.6–15.2)

## 2014-06-06 MED ORDER — LORAZEPAM 2 MG/ML IJ SOLN: 1.0000 mg | Freq: Four times a day (QID) | INTRAMUSCULAR | Status: DC | PRN

## 2014-06-06 MED ORDER — IOHEXOL 350 MG/ML SOLN
100.0000 mL | Freq: Once | INTRAVENOUS | Status: AC | PRN
  Administered 2014-06-06: 100 mL via INTRAVENOUS

## 2014-06-06 MED ORDER — FOLIC ACID 1 MG PO TABS
1.0000 mg | ORAL_TABLET | Freq: Every day | ORAL | Status: DC
  Administered 2014-06-06 – 2014-06-07 (×2): 1 mg via ORAL
  Filled 2014-06-06 (×2): qty 1

## 2014-06-06 MED ORDER — ADULT MULTIVITAMIN W/MINERALS CH
1.0000 | ORAL_TABLET | Freq: Every day | ORAL | Status: DC
  Administered 2014-06-06 – 2014-06-07 (×2): 1 via ORAL
  Filled 2014-06-06 (×2): qty 1

## 2014-06-06 MED ORDER — VITAMIN B-1 100 MG PO TABS
100.0000 mg | ORAL_TABLET | Freq: Every day | ORAL | Status: DC
  Administered 2014-06-06 – 2014-06-07 (×2): 100 mg via ORAL
  Filled 2014-06-06 (×2): qty 1

## 2014-06-06 MED ORDER — SODIUM CHLORIDE 0.9 % IJ SOLN
3.0000 mL | Freq: Two times a day (BID) | INTRAMUSCULAR | Status: DC
  Administered 2014-06-06 (×3): 3 mL via INTRAVENOUS

## 2014-06-06 MED ORDER — LORAZEPAM 1 MG PO TABS
1.0000 mg | ORAL_TABLET | Freq: Four times a day (QID) | ORAL | Status: DC | PRN
  Administered 2014-06-06 – 2014-06-07 (×4): 1 mg via ORAL
  Filled 2014-06-06 (×4): qty 1

## 2014-06-06 MED ORDER — RIVAROXABAN 20 MG PO TABS
20.0000 mg | ORAL_TABLET | Freq: Every day | ORAL | Status: DC
  Administered 2014-06-06: 20 mg via ORAL
  Filled 2014-06-06 (×3): qty 1

## 2014-06-06 MED ORDER — RIVAROXABAN 20 MG PO TABS
20.0000 mg | ORAL_TABLET | Freq: Every day | ORAL | Status: DC
  Filled 2014-06-06: qty 1

## 2014-06-06 MED ORDER — PIPERACILLIN-TAZOBACTAM 3.375 G IVPB 30 MIN
3.3750 g | Freq: Once | INTRAVENOUS | Status: AC
  Administered 2014-06-06: 3.375 g via INTRAVENOUS
  Filled 2014-06-06: qty 50

## 2014-06-06 MED ORDER — ACETAMINOPHEN 325 MG PO TABS
650.0000 mg | ORAL_TABLET | ORAL | Status: DC | PRN
  Administered 2014-06-06 (×2): 650 mg via ORAL
  Filled 2014-06-06 (×2): qty 2

## 2014-06-06 MED ORDER — WARFARIN - PHARMACIST DOSING INPATIENT: Freq: Every day | Status: DC

## 2014-06-06 MED ORDER — RIVAROXABAN 15 MG PO TABS: 15.0000 mg | ORAL_TABLET | Freq: Once | ORAL | Status: DC

## 2014-06-06 MED ORDER — THIAMINE HCL 100 MG/ML IJ SOLN
100.0000 mg | Freq: Every day | INTRAMUSCULAR | Status: DC
  Filled 2014-06-06 (×2): qty 1

## 2014-06-06 MED ORDER — WARFARIN SODIUM 7.5 MG PO TABS
7.5000 mg | ORAL_TABLET | Freq: Once | ORAL | Status: DC
  Filled 2014-06-06: qty 1

## 2014-06-06 MED ORDER — ENOXAPARIN SODIUM 100 MG/ML ~~LOC~~ SOLN
1.0000 mg/kg | Freq: Two times a day (BID) | SUBCUTANEOUS | Status: DC
  Filled 2014-06-06 (×3): qty 1

## 2014-06-06 MED ORDER — NICOTINE 21 MG/24HR TD PT24
21.0000 mg | MEDICATED_PATCH | Freq: Every day | TRANSDERMAL | Status: DC
  Administered 2014-06-06 – 2014-06-07 (×2): 21 mg via TRANSDERMAL
  Filled 2014-06-06 (×2): qty 1

## 2014-06-06 MED ORDER — INFLUENZA VAC SPLIT QUAD 0.5 ML IM SUSY
0.5000 mL | PREFILLED_SYRINGE | INTRAMUSCULAR | Status: AC
  Administered 2014-06-07: 0.5 mL via INTRAMUSCULAR
  Filled 2014-06-06 (×2): qty 0.5

## 2014-06-06 MED ORDER — VANCOMYCIN HCL IN DEXTROSE 1-5 GM/200ML-% IV SOLN
1000.0000 mg | Freq: Once | INTRAVENOUS | Status: AC
  Administered 2014-06-06: 1000 mg via INTRAVENOUS
  Filled 2014-06-06: qty 200

## 2014-06-06 NOTE — H&P (Signed)
[facility] History and Physical  [name] PZW:[id number] DOB: October 25, 1965 DOA: 06/05/2014  Referring physician: EDP PCP: Default, Provider, MD   Chief Complaint: Leg swelling   HPI: [name] is a 48 y.o. male with history of recurrent DVTs, secondary to lupus anticoagulant disorder.  His coagulation disorder has been difficult to treat as he fully admits that he isnt very consistent with taking his anticoagulants every day, and has even accidentally overdosed on them in the past as with coumadin.  He presents to the ED with LLE swelling and erythema, he thinks for about the last 2 weeks, no trauma.  He is unsure when the last dose of xarelto he took at home was.  (I suspect it was not within the past 24 hours or so as his coag studies are stone cold normal here in the ED.)  Review of Systems: Systems reviewed.  As above, otherwise negative  Past Medical History  Diagnosis Date  . Coronary artery disease   . Degenerative joint disease of spine   . Pulmonary embolism 06/2010  . Degenerative joint disease   . 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 ETOH abuse   . DVT (deep venous thrombosis)   . Hepatitis B   . Mental disorder   . Depression    Past Surgical History  Procedure Laterality Date  . Left elbow surgery    . Tonsillectomy     Social History:  reports that he has been smoking Cigarettes.  He has a 60 pack-year smoking history. He has never used smokeless tobacco. He reports that he drinks about 7.2 ounces of alcohol per week. He reports that he does not use illicit drugs.  No Known Allergies  Family History  Problem Relation Age of Onset  . Cancer Mother     Ovarian     Prior to Admission medications   Medication Sig Start Date End Date Taking? Authorizing Provider  rivaroxaban (XARELTO) 20 MG TABS tablet Take 1 tablet (20 mg total) by mouth daily with supper. 02/02/14  Yes Nena Polio, PA-C   Physical Exam: Filed Vitals:   06/06/14 0105  BP:  110/70  Pulse: 89  Temp: 97.9 F (36.6 C)  Resp: 18    BP 110/70  Pulse 89  Temp(Src) 97.9 F (36.6 C) (Oral)  Resp 18  SpO2 96%  General Appearance:    Alert, oriented, no distress, appears stated age  Head:    Normocephalic, atraumatic  Eyes:    PERRL, EOMI, sclera non-icteric        Nose:   Nares without drainage or epistaxis. Mucosa, turbinates normal  Throat:   Moist mucous membranes. Oropharynx without erythema or exudate.  Neck:   Supple. No carotid bruits.  No thyromegaly.  No lymphadenopathy.   Back:     No CVA tenderness, no spinal tenderness  Lungs:     Clear to auscultation bilaterally, without wheezes, rhonchi or rales  Chest wall:    No tenderness to palpitation  Heart:    Regular rate and rhythm without murmurs, gallops, rubs  Abdomen:     Soft, non-tender, nondistended, normal bowel sounds, no organomegaly  Genitalia:    deferred  Rectal:    deferred  Extremities:   LLE, swelling, erythema, and tenderness about ankle.  Pulses:   2+ and symmetric all extremities  Skin:   Skin color, texture, turgor normal, no rashes or lesions  Lymph nodes:   Cervical, supraclavicular, and axillary nodes normal  Neurologic:   CNII-XII intact.  Normal strength, sensation and reflexes      throughout    Labs on Admission:  Basic Metabolic Panel:  Recent Labs Lab 06/05/14 2346  NA 142  K 4.2  CL 110  GLUCOSE 89  BUN 3*  CREATININE 1.00   Liver Function Tests: No results found for this basename: AST, ALT, ALKPHOS, BILITOT, PROT, ALBUMIN,  in the last 168 hours No results found for this basename: LIPASE, AMYLASE,  in the last 168 hours No results found for this basename: AMMONIA,  in the last 168 hours CBC:  Recent Labs Lab 06/05/14 2334 06/05/14 2346  WBC 4.8  --   NEUTROABS 2.4  --   HGB 14.3 15.6  HCT 42.1 46.0  MCV 100.0  --   PLT 179  --    Cardiac Enzymes: No results found for this basename: CKTOTAL, CKMB, CKMBINDEX, TROPONINI,  in the last 168  hours  BNP (last 3 results) No results found for this basename: PROBNP,  in the last 8760 hours CBG: No results found for this basename: GLUCAP,  in the last 168 hours  Radiological Exams on Admission: Ct Angio Chest W/cm &/or Wo Cm  06/06/2014   CLINICAL DATA:  Left leg pain/swelling, history of DVT and PE, on Xarelto. IVC filter. History of lupus anticoagulate shin disorder.  EXAM: CT ANGIOGRAPHY CHEST WITH CONTRAST  TECHNIQUE: Multidetector CT imaging of the chest was performed using the standard protocol during bolus administration of intravenous contrast. Multiplanar CT image reconstructions and MIPs were obtained to evaluate the vascular anatomy.  CONTRAST:  173mL OMNIPAQUE IOHEXOL 350 MG/ML SOLN  COMPARISON:  12/11/2012  FINDINGS: No evidence of pulmonary embolism.  Calcified granuloma in the superior segment left lower lobe (series 7/ image 41). Mild biapical pleural parenchymal scarring. No suspicious pulmonary nodules. No pleural effusion or pneumothorax.  Visualized thyroid is unremarkable.  The heart is normal in size.  No pericardial effusion.  Small mediastinal lymph nodes which do not meet pathologic CT size criteria.  Visualized upper abdomen is unremarkable.  Visualized osseous structures are within normal limits.  Review of the MIP images confirms the above findings.  IMPRESSION: No evidence of pulmonary embolism.  No evidence of acute cardiopulmonary disease.   Electronically Signed   By: Julian Hy M.D.   On: 06/06/2014 01:04    EKG: Independently reviewed.  Assessment/Plan Principal Problem:   Left leg DVT Active Problems:   Alcohol abuse   1. Left leg swelling and story that is highly suspicious of DVT - cellulitis is possible but less likely, got zosyn and vanc in ED, will hold off on further ABx treatment until venous duplex can confirm or rule out primary pathology as DVT in this leg in the morning.  Putting patient on Xarelto. 2. EtOH abuse - presenting hazards  to health, he fully admits that he wants to stop drinking and his drinking is interfering with his ability to take meds.  Will consult SW to see if there is some sort of long term detox we can get him into.    Code Status: Full Code  Family Communication: No family in room Disposition Plan: Admit to inpatient   Time spent: 70 min  Alanea Woolridge M. [facility] Pager [phone]  If 7AM-7PM, please contact the day team taking care of the patient [email or web address] Password [email or web address] 06/06/2014, 2:35 AM

## 2014-06-06 NOTE — Progress Notes (Signed)
UR completed 

## 2014-06-06 NOTE — Care Management Note (Addendum)
    Page 1 of 1   06/07/2014     1:35:18 PM CARE MANAGEMENT NOTE 06/07/2014  Patient:  Bufford,Breven   Account Number:  [id number]  Date Initiated:  06/06/2014  Documentation initiated by:  Dessa Phi  Subjective/Objective Assessment:   48 Y/O M ADMITTED W/L LEG DVT.     Action/Plan:   HOMELESS.   Anticipated DC Date:  06/07/2014   Anticipated DC Plan:  [place]  CM consult  [facility]      Choice offered to / List presented to:             Status of service:  Completed, signed off Medicare Important Message given?   (If response is "NO", the following Medicare IM given date fields will be blank) Date Medicare IM given:   Medicare IM given by:   Date Additional Medicare IM given:   Additional Medicare IM given by:    Discharge Disposition:  HOME/SELF CARE  Per UR Regulation:  Reviewed for med. necessity/level of care/duration of stay  If discussed at [place] of Stay Meetings, dates discussed:    Comments:  06/07/14 Jamey Demchak RN,BSN NCM [phone] D/C HOME NO FURTHER D/C NEEDS.  06/06/14 Nykayla Marcelli RN,BSN NCM [phone] 3:50P-[facility] APPT SET SEE F/U D/C SECTION.NURSE NOTIFIED.SW FOLLOWING FOR HOMELESSNESS.NO FURTHER D/C NEEDS. WILL ENCOURAGE [place] FOR PCP.

## 2014-06-06 NOTE — Progress Notes (Addendum)
ANTICOAGULATION CONSULT NOTE - Initial Consult  Pharmacy Consult for Lovenox/Warfarin Indication: VTE Treatment  No Known Allergies  Patient Measurements: Height: 6\' 2"  (188 cm) Weight: 205 lb (92.987 kg) IBW/kg (Calculated) : 82.2  Vital Signs: Temp: 97.7 F (36.5 C) (09/30 0404) Temp src: Oral (09/30 0404) BP: 150/82 mmHg (09/30 0404) Pulse Rate: 96 (09/30 0600)  Labs:  Recent Labs  06/05/14 2334 06/05/14 2346 06/06/14 0510  HGB 14.3 15.6  --   HCT 42.1 46.0  --   PLT 179  --   --   LABPROT 13.8  --  13.4  INR 1.06  --  1.02  CREATININE  --  1.00  --     Estimated Creatinine Clearance: 103.9 ml/min (by C-G formula based on Cr of 1).   Medical History: Past Medical History  Diagnosis Date  . Coronary artery disease   . Degenerative joint disease of spine   . Pulmonary embolism 06/2010  . Degenerative joint disease   . 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 ETOH abuse   . DVT (deep venous thrombosis)   . Hepatitis B   . Mental disorder   . Depression     Assessment: 14 yom with hx of recurrent DVTs, secondary to lupus anticoagulant disorder. Pt admits non-compliance with Xarelto therapy. Venous duplex today reveals left leg DVT in femoral, profunda, popliteal and posterior tibial veins. Pharmacy consulted to start Lovenox VTE treatment dosing and bridge to warfarin.    CBC WNL  Renal: SCr WNL CrCl 48ml/min N  PTA Xarelto 20mg  daily  Last dose 9/30 0830  Drug Interactions: none noted  Diet: regular  Goal of Therapy:  Treatment of lower extremity DVT INR 2-3 Monitor platelets by anticoagulation protocol: Yes   Plan:   Start Lovenox 95mg  subq BID 24 hours after last Xarelto dose  Start 10/1 0830  Give warfarin 7.5mg  po x 1 tomorrow  Continue Lovenox until INR > 2 x two consecutive days after 5 days of warfarin therapy  Daily INR  Monitor SCr, plts  Kizzie Furnish, PharmD Pager: [phone] 06/06/2014 11:53 AM
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
## 2014-06-06 NOTE — Progress Notes (Signed)
Bilateral lower extremity venous duplex completed.  Right:  No evidence of DVT, superficial thrombosis, or Baker's cyst.  Left: DVT noted in the femoral, profunda, popliteal, and posterior tibial veins.  No evidence of superficial thrombosis.  No Baker's cyst.

## 2014-06-06 NOTE — Progress Notes (Addendum)
Patient admitted earlier this morning. History of alcohol and smoking abuse, counseled to quit both, will place on nicotine patch. Continue CIWA protocol  He came in for left leg swelling, history of DVT in that leg, he was on xaralto at home and he says he was compliant with it ?? And missed 1 day maximum in the last month. Repeat ultrasound pending. If DVT is positive we might declare this as xaralto failure and switch him on Lovenox and Coumadin combination.

## 2014-06-06 NOTE — ED Provider Notes (Signed)
CSN: [id number]     Arrival date & time 06/05/14  1835 History   First MD Initiated Contact with Patient 06/05/14 2321     Chief Complaint  Patient presents with  . Leg Pain    Concern for DVT     (Consider location/radiation/quality/duration/timing/severity/associated sxs/prior Treatment) Patient is a 48 y.o. male presenting with leg pain. The history is provided by the patient. No language interpreter was used.  Leg Pain Location:  Leg Injury: no   Leg location:  L lower leg Pain details:    Quality:  Aching   Radiates to:  Does not radiate   Severity:  Moderate   Onset quality:  Gradual   Timing:  Constant   Progression:  Unchanged Chronicity:  Recurrent Dislocation: no   Prior injury to area:  No Relieved by:  Nothing Worsened by:  Nothing tried Ineffective treatments:  None tried Associated symptoms: no back pain   Risk factors: no concern for non-accidental trauma     Past Medical History  Diagnosis Date  . Coronary artery disease   . Degenerative joint disease of spine   . Pulmonary embolism 06/2010  . Degenerative joint disease   . 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 ETOH abuse   . DVT (deep venous thrombosis)   . Hepatitis B   . Mental disorder   . Depression    Past Surgical History  Procedure Laterality Date  . Left elbow surgery    . Tonsillectomy     Family History  Problem Relation Age of Onset  . Cancer Mother     Ovarian   History  Substance Use Topics  . Smoking status: Current Every Day Smoker -- 2.00 packs/day for 30 years    Types: Cigarettes  . Smokeless tobacco: Never Used  . Alcohol Use: 7.2 oz/week    12 Cans of beer per week     Comment: Drinks a 6-pack of beer daily.    Review of Systems  Musculoskeletal: Negative for back pain.  All other systems reviewed and are negative.     Allergies  Review of patient's allergies indicates no known allergies.  Home Medications   Prior to Admission medications    Medication Sig Start Date End Date Taking? Authorizing Provider  rivaroxaban (XARELTO) 20 MG TABS tablet Take 1 tablet (20 mg total) by mouth daily with supper. 02/02/14  Yes Neil Mashburn, PA-C   BP 100/71  Pulse 99  Temp(Src) 97.4 F (36.3 C) (Oral)  Resp 18  SpO2 94% Physical Exam  Constitutional: He is oriented to person, place, and time. He appears well-developed and well-nourished. No distress.  HENT:  Head: Normocephalic and atraumatic.  Mouth/Throat: Oropharynx is clear and moist.  Eyes: Conjunctivae are normal. Pupils are equal, round, and reactive to light.  Neck: Normal range of motion. Neck supple.  Cardiovascular: Normal rate, regular rhythm and intact distal pulses.   Pulmonary/Chest: Effort normal and breath sounds normal. He has no wheezes. He has no rales.  Abdominal: Soft. Bowel sounds are normal. There is no tenderness. There is no rebound and no guarding.  Musculoskeletal: Normal range of motion.       Left lower leg: He exhibits swelling. He exhibits no deformity and no laceration.  Intact dorsalis pedis NVI left foot cap refill < 2 sec  Neurological: He is alert and oriented to person, place, and time.  Skin: Skin is warm and dry.  Psychiatric: He has a normal mood and affect.    ED  Course  Procedures (including critical care time) Labs Review Labs Reviewed  CBC WITH DIFFERENTIAL - Abnormal; Notable for the following:    RBC 4.21 (*)    All other components within normal limits  I-STAT CHEM 8, ED - Abnormal; Notable for the following:    BUN 3 (*)    Calcium, Ion 1.03 (*)    All other components within normal limits  PROTIME-INR    Imaging Review No results found.   EKG Interpretation None      MDM   Final diagnoses:  None    Admit to medicine seen by Alcario Drought disposition made by gardner    Philippe Gang K Joanna Hall-Rasch, MD 06/06/14 [phone]

## 2014-06-07 DIAGNOSIS — I82402 Acute embolism and thrombosis of unspecified deep veins of left lower extremity: Secondary | ICD-10-CM

## 2014-06-07 MED ORDER — RIVAROXABAN 20 MG PO TABS
20.0000 mg | ORAL_TABLET | Freq: Every day | ORAL | Status: DC
  Administered 2014-06-07: 20 mg via ORAL
  Filled 2014-06-07: qty 1

## 2014-06-07 MED ORDER — THIAMINE HCL 100 MG PO TABS: 100.0000 mg | ORAL_TABLET | Freq: Every day | ORAL | Status: DC

## 2014-06-07 MED ORDER — RIVAROXABAN 20 MG PO TABS: 20.0000 mg | ORAL_TABLET | Freq: Every day | ORAL | Status: DC

## 2014-06-07 MED ORDER — NICOTINE 21 MG/24HR TD PT24: 21.0000 mg | MEDICATED_PATCH | Freq: Every day | TRANSDERMAL | Status: DC

## 2014-06-07 MED ORDER — FOLIC ACID 1 MG PO TABS: 1.0000 mg | ORAL_TABLET | Freq: Every day | ORAL | Status: DC

## 2014-06-07 NOTE — Discharge Instructions (Signed)
Follow with Primary MD in 7 days   Get CBC, CMP, 2 view Chest X ray checked  by Primary MD next visit.    Activity: As tolerated with Full fall precautions use walker/cane & assistance as needed   Disposition Home     Diet: Heart Healthy    For Heart failure patients - Check your Weight same time everyday, if you gain over 2 pounds, or you develop in leg swelling, experience more shortness of breath or chest pain, call your Primary MD immediately. Follow Cardiac Low Salt Diet and 1.8 lit/day fluid restriction.   On your next visit with her primary care physician please Get Medicines reviewed and adjusted.  Please request your Prim.MD to go over all Hospital Tests and Procedure/Radiological results at the follow up, please get all Hospital records sent to your Prim MD by signing hospital release before you go home.   If you experience worsening of your admission symptoms, develop shortness of breath, life threatening emergency, suicidal or homicidal thoughts you must seek medical attention immediately by calling 911 or calling your MD immediately  if symptoms less severe.  You Must read complete instructions/literature along with all the possible adverse reactions/side effects for all the Medicines you take and that have been prescribed to you. Take any new Medicines after you have completely understood and accpet all the possible adverse reactions/side effects.   Do not drive, operating heavy machinery, perform activities at heights, swimming or participation in water activities or provide baby sitting services if your were admitted for syncope or siezures until you have seen by Primary MD or a Neurologist and advised to do so again.  Do not drive when taking Pain medications.    Do not take more than prescribed Pain, Sleep and Anxiety Medications  Special Instructions: If you have smoked or chewed Tobacco  in the last 2 yrs please stop smoking, stop any regular Alcohol  and or any  Recreational drug use.  Wear Seat belts while driving.   Please note  You were cared for by a hospitalist during your hospital stay. If you have any questions about your discharge medications or the care you received while you were in the hospital after you are discharged, you can call the unit and asked to speak with the hospitalist on call if the hospitalist that took care of you is not available. Once you are discharged, your primary care physician will handle any further medical issues. Please note that NO REFILLS for any discharge medications will be authorized once you are discharged, as it is imperative that you return to your primary care physician (or establish a relationship with a primary care physician if you do not have one) for your aftercare needs so that they can reassess your need for medications and monitor your lab values.

## 2014-06-07 NOTE — Progress Notes (Signed)
Discharge instructions given to pt, verbalized understanding, left the unit in stable condition.

## 2014-06-07 NOTE — Discharge Summary (Signed)
[name], is a 48 y.o. male  DOB Sep 06, 1966  MRN [id number].  Admission date:  06/05/2014  Admitting Physician  Etta Quill, DO  Discharge Date:  06/07/2014   Primary MD  Default, Provider, MD  Recommendations for primary care physician for things to follow:   Monitor compliance with medications   Admission Diagnosis  Alcohol abuse [F10.10] Left leg DVT [I82.402]   Discharge Diagnosis  Alcohol abuse [F10.10] Left leg DVT [I82.402]    Principal Problem:   Left leg DVT Active Problems:   Alcohol abuse   Lupus anticoagulant disorder   Homeless single person      Past Medical History  Diagnosis Date  . Coronary artery disease   . Degenerative joint disease of spine   . Pulmonary embolism 06/2010  . Degenerative joint disease   . 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 ETOH abuse   . DVT (deep venous thrombosis)   . Hepatitis B   . Mental disorder   . Depression     Past Surgical History  Procedure Laterality Date  . Left elbow surgery    . Tonsillectomy         History of present illness and  Hospital Course:     Kindly see H&P for history of present illness and admission details, please review complete Labs, Consult reports and Test reports for all details in brief  HPI  from the history and physical done on the day of admission  [name] is a 48 y.o. male with history of recurrent DVTs, secondary to lupus anticoagulant disorder. His coagulation disorder has been difficult to treat as he fully admits that he isnt very consistent with taking his anticoagulants every day, and has even accidentally overdosed on them in the past as with coumadin.   He presents to the ED with LLE swelling and erythema, he thinks for about the last 2 weeks, no trauma.   He is unsure when the last dose of xarelto he  took at home was. (I suspect it was not within the past 24 hours or so as his coag studies are stone cold normal here in the ED.)   Hospital Course    1. Left leg swelling. Secondary to recurrent DVT, patient has history of lupus anticoagulant, multiple DVTs, long-standing history of noncompliance. He status post IVC filter in the past. He now comes in with left leg swelling positive for DVT, she initially came he was compliant with his medications but upon further questioning he now says that he missed xaralto for 7 days and afterwards developed left leg swelling, he said he forgot to take him. At this point he will be placed on xaralto, 30 supply will be given, he has been extensively counseled on medication compliance and told/warned that if he does not do that it might result in disability and death. Social work and Tourist information centre manager will see him. He has seen an oncologist Dr.Gorsuch in the past and I will request to see her again one time post discharge.  2. Alcohol abuse. Counseled to quit, no signs of DT, place on folic acid and thiamine. Social work to see him for help.    3. Poor social situation. Change to be homeless. Case Occupational hygienist both consulted to help    4. History of hepatitis B. Outpatient followup with PCP.    5. Smoking. Counseled to quit. Provide nicotine patches.      Discharge Condition: stable   Follow UP  Follow-up Information   Follow up with [facility]     On 06/12/2014. (DR. Adrian Blackwater @ 10:30A/PHOTO ID/$[id number] CO PAY/MEDS IN BOTTLE.)    Contact information:   [place] [address] [phone]      Follow up with [facility] [place], NI, MD. Schedule an appointment as soon as possible for a visit in 1 week.   Specialty:  Hematology and Oncology   Contact information:   [place] [address] [phone]         Discharge Instructions  and  Discharge Medications     Discharge  Instructions   Diet - low sodium heart healthy    Complete by:  As directed      Discharge instructions    Complete by:  As directed   Follow with Primary MD in 7 days   Get CBC, CMP, 2 view Chest X ray checked  by Primary MD next visit.    Activity: As tolerated with Full fall precautions use walker/cane & assistance as needed   Disposition Home     Diet: Heart Healthy    For Heart failure patients - Check your Weight same time everyday, if you gain over 2 pounds, or you develop in leg swelling, experience more shortness of breath or chest pain, call your Primary MD immediately. Follow Cardiac Low Salt Diet and 1.8 lit/day fluid restriction.   On your next visit with her primary care physician please Get Medicines reviewed and adjusted.  Please request your Prim.MD to go over all Hospital Tests and Procedure/Radiological results at the follow up, please get all Hospital records sent to your Prim MD by signing hospital release before you go home.   If you experience worsening of your admission symptoms, develop shortness of breath, life threatening emergency, suicidal or homicidal thoughts you must seek medical attention immediately by calling 911 or calling your MD immediately  if symptoms less severe.  You Must read complete instructions/literature along with all the possible adverse reactions/side effects for all the Medicines you take and that have been prescribed to you. Take any new Medicines after you have completely understood and accpet all the possible adverse reactions/side effects.   Do not drive, operating heavy machinery, perform activities at heights, swimming or participation in water activities or provide baby sitting services if your were admitted for syncope or siezures until you have seen by Primary MD or a Neurologist and advised to do so again.  Do not drive when taking Pain medications.    Do not take more than prescribed Pain, Sleep and Anxiety  Medications  Special Instructions: If you have smoked or chewed Tobacco  in the last 2 yrs please stop smoking, stop any regular Alcohol  and or any Recreational drug use.  Wear Seat belts while driving.   Please note  You were cared for by a hospitalist during your hospital stay. If you have any questions about your discharge medications or the care you received while you were in the hospital  after you are discharged, you can call the unit and asked to speak with the hospitalist on call if the hospitalist that took care of you is not available. Once you are discharged, your primary care physician will handle any further medical issues. Please note that NO REFILLS for any discharge medications will be authorized once you are discharged, as it is imperative that you return to your primary care physician (or establish a relationship with a primary care physician if you do not have one) for your aftercare needs so that they can reassess your need for medications and monitor your lab values.     Increase activity slowly    Complete by:  As directed             Medication List         folic acid 1 MG tablet  Commonly known as:  FOLVITE  Take 1 tablet (1 mg total) by mouth daily.     nicotine 21 mg/24hr patch  Commonly known as:  NICODERM CQ - dosed in mg/24 hours  Place 1 patch (21 mg total) onto the skin daily.     rivaroxaban 20 MG Tabs tablet  Commonly known as:  XARELTO  Take 1 tablet (20 mg total) by mouth daily with supper.     thiamine 100 MG tablet  Take 1 tablet (100 mg total) by mouth daily.          Diet and Activity recommendation: See Discharge Instructions above   Consults obtained - C manager-S work   Major procedures and Radiology Reports - PLEASE review detailed and final reports for all details, in brief -       Dg Tibia/fibula Left  06/06/2014   CLINICAL DATA:  Anterior midshaft of left tib-fib is inflamed and painful, history of DVTs  EXAM: LEFT  TIBIA AND FIBULA - 2 VIEW  COMPARISON:  None.  FINDINGS: No fracture or dislocation is seen.  Mild degenerative changes at the tibiotalar joint.  Visualized soft tissues are grossly unremarkable.  IMPRESSION: No acute osseus abnormality is seen.   Electronically Signed   By: Julian Hy M.D.   On: 06/06/2014 03:01   Ct Angio Chest W/cm &/or Wo Cm  06/06/2014   CLINICAL DATA:  Left leg pain/swelling, history of DVT and PE, on Xarelto. IVC filter. History of lupus anticoagulate shin disorder.  EXAM: CT ANGIOGRAPHY CHEST WITH CONTRAST  TECHNIQUE: Multidetector CT imaging of the chest was performed using the standard protocol during bolus administration of intravenous contrast. Multiplanar CT image reconstructions and MIPs were obtained to evaluate the vascular anatomy.  CONTRAST:  178mL OMNIPAQUE IOHEXOL 350 MG/ML SOLN  COMPARISON:  12/11/2012  FINDINGS: No evidence of pulmonary embolism.  Calcified granuloma in the superior segment left lower lobe (series 7/ image 41). Mild biapical pleural parenchymal scarring. No suspicious pulmonary nodules. No pleural effusion or pneumothorax.  Visualized thyroid is unremarkable.  The heart is normal in size.  No pericardial effusion.  Small mediastinal lymph nodes which do not meet pathologic CT size criteria.  Visualized upper abdomen is unremarkable.  Visualized osseous structures are within normal limits.  Review of the MIP images confirms the above findings.  IMPRESSION: No evidence of pulmonary embolism.  No evidence of acute cardiopulmonary disease.   Electronically Signed   By: Julian Hy M.D.   On: 06/06/2014 01:04    Micro Results      No results found for this or any previous visit (from the past 240  hour(s)).     Today   Subjective:   [name] today has no headache,no chest abdominal pain,no new weakness tingling or numbness, feels much better wants to go home today.    Objective:   Blood pressure 157/80, pulse 57, temperature  98.6 F (37 C), temperature source Oral, resp. rate 16, height 6\' 2"  (1.88 m), weight 92.987 kg (205 lb), SpO2 96.00%.   Intake/Output Summary (Last 24 hours) at 06/07/14 4097 Last data filed at 06/06/14 2058  Gross per 24 hour  Intake      3 ml  Output    450 ml  Net   -447 ml    Exam Awake Alert, Oriented x 3, No new F.N deficits, Normal affect [place].AT,PERRAL Supple Neck,No JVD, No cervical lymphadenopathy appriciated.  Symmetrical Chest wall movement, Good air movement bilaterally, CTAB RRR,No Gallops,Rubs or new Murmurs, No Parasternal Heave +ve B.Sounds, Abd Soft, Non tender, No organomegaly appriciated, No rebound -guarding or rigidity. No Cyanosis, Clubbing or edema, No new Rash or bruise, L leg swollen  Data Review   CBC w Diff: Lab Results  Component Value Date   WBC 4.8 06/05/2014   WBC 8.0 02/07/2014   HGB 15.6 06/05/2014   HGB 14.3 02/07/2014   HCT 46.0 06/05/2014   HCT 42.4 02/07/2014   PLT 179 06/05/2014   PLT 236 02/07/2014   LYMPHOPCT 40 06/05/2014   LYMPHOPCT 17.0 02/07/2014   MONOPCT 7 06/05/2014   MONOPCT 10.7 02/07/2014   EOSPCT 2 06/05/2014   EOSPCT 2.3 02/07/2014   BASOPCT 0 06/05/2014   BASOPCT 0.8 02/07/2014    CMP: Lab Results  Component Value Date   NA 142 06/05/2014   NA 141 02/07/2014   K 4.2 06/05/2014   K 4.5 02/07/2014   CL 110 06/05/2014   CL 109* 01/19/2013   CO2 18* 02/07/2014   CO2 21 01/26/2014   BUN 3* 06/05/2014   BUN 8.4 02/07/2014   CREATININE 1.00 06/05/2014   CREATININE 0.8 02/07/2014   PROT 7.5 02/07/2014   PROT 7.1 01/26/2014   ALBUMIN 4.0 02/07/2014   ALBUMIN 3.5 01/26/2014   BILITOT 0.33 02/07/2014   BILITOT <0.2* 01/26/2014   ALKPHOS 72 02/07/2014   ALKPHOS 82 01/26/2014   AST 25 02/07/2014   AST 43* 01/26/2014   ALT 21 02/07/2014   ALT 26 01/26/2014  .   Total Time in preparing paper work, data evaluation and todays exam - 35 minutes  Thurnell Lose M.D on 06/07/2014 at [facility]  [phone]   **Disclaimer: This note  may have been dictated with voice recognition software. Similar sounding words can inadvertently be transcribed and this note may contain transcription errors which may not have been corrected upon publication of note.**

## 2014-06-07 NOTE — Progress Notes (Signed)
Clinical Social Work Department BRIEF PSYCHOSOCIAL ASSESSMENT 06/07/2014  Patient:  [name],[name]     Account Number:  [id number]     Admit date:  06/05/2014  Clinical Social Worker:  Earlie Server  Date/Time:  06/07/2014 10:30 AM  Referred by:  Physician  Date Referred:  06/07/2014 Referred for  Substance Abuse  Homelessness   Other Referral:   Interview type:  Patient Other interview type:    PSYCHOSOCIAL DATA Living Status:  OTHER Admitted from facility:   Level of care:   Primary support name:  Ollen Gross Primary support relationship to patient:  FRIEND Degree of support available:   Lacking    CURRENT CONCERNS Current Concerns  Substance Abuse  Post-Acute Placement   Other Concerns:    SOCIAL WORK ASSESSMENT / PLAN CSW received referral in order to complete psychosocial assessment. CSW reviewed chart and met with patient at bedside. CSW introduced myself and explained role. Patient remembered CSW from previous admission.    Patient reports that he has been homeless for several years and currently living in a tent community with his friends and his brother. Patient reports he has stayed in homeless shelters in the past but would prefer to live in his tent then staying in a shelter. Patient has applied for disability but has not been approved at this time. Patient reports he plans to return back to tent and that his brother will assist him as needed. CSW provided patient with bus pass and petty cash for PART bus pass. CSW offered directions and which routes he needed to use but patient is familiar with bus and declined assistance. Patient reports limited clothes supply and did request pants and shirt which CSW provided from clothes closet. CSW also informed patient that [employer] has clothes available if needed.    Patient open to discussing his substance use. Patient reports that he started drinking about 33 years ago socially but became addicted over the years.  Patient is currently drinking on a daily basis and reports he starts drinking as soon as he wakes up. Patient reports he has been to [facility] meetings in the past but they are not effective. Patient has never been for residential placement but does not believe it would be effective. Patient reports he likes drinking alcohol because it helps him cope since he is homeless. Patient declining all resources and reports he just wants to DC.    CSW informed RN of patient's DC plans. CSW is signing off and patient has clothes, bus pass, and money for PART bus pass when he is stable to DC.   Assessment/plan status:  No Further Intervention Required Other assessment/ plan:   SBIRT   Information/referral to community resources:   [employer] for clothes  Patient declined SA resources and shelter list    PATIENT'S/FAMILY'S RESPONSE TO PLAN OF CARE: Patient alert and oriented. Patient sitting in bed and watching TV when CSW arrived. Patient reports that he is hoping to DC soon and only needed help with clothes and bus pass. Patient agreeable to talk about his living situation and substance use but reports he is comfortable with his arrangements and does not need any assistance. Patient engaged but reports no further needs at this time.       [place], [place] [phone]

## 2014-06-12 ENCOUNTER — Inpatient Hospital Stay: Payer: Self-pay | Admitting: Family Medicine

## 2014-06-24 ENCOUNTER — Emergency Department (HOSPITAL_COMMUNITY): Payer: Medicaid Other

## 2014-06-24 ENCOUNTER — Emergency Department (HOSPITAL_COMMUNITY)
Admission: EM | Admit: 2014-06-24 | Discharge: 2014-06-25 | Disposition: A | Discharge status: Home / Self Care | Payer: Medicaid Other | Attending: Emergency Medicine | Admitting: Emergency Medicine

## 2014-06-24 ENCOUNTER — Encounter (HOSPITAL_COMMUNITY): Payer: Self-pay | Admitting: Emergency Medicine

## 2014-06-24 DIAGNOSIS — Z8619 Personal history of other infectious and parasitic diseases: Secondary | ICD-10-CM | POA: Diagnosis not present

## 2014-06-24 DIAGNOSIS — Z8739 Personal history of other diseases of the musculoskeletal system and connective tissue: Secondary | ICD-10-CM | POA: Insufficient documentation

## 2014-06-24 DIAGNOSIS — R531 Weakness: Secondary | ICD-10-CM | POA: Diagnosis not present

## 2014-06-24 DIAGNOSIS — I2510 Atherosclerotic heart disease of native coronary artery without angina pectoris: Secondary | ICD-10-CM | POA: Diagnosis not present

## 2014-06-24 DIAGNOSIS — Z7901 Long term (current) use of anticoagulants: Secondary | ICD-10-CM | POA: Insufficient documentation

## 2014-06-24 DIAGNOSIS — J209 Acute bronchitis, unspecified: Secondary | ICD-10-CM | POA: Diagnosis not present

## 2014-06-24 DIAGNOSIS — Z79899 Other long term (current) drug therapy: Secondary | ICD-10-CM | POA: Insufficient documentation

## 2014-06-24 DIAGNOSIS — Z720 Tobacco use: Secondary | ICD-10-CM | POA: Diagnosis not present

## 2014-06-24 DIAGNOSIS — Z8659 Personal history of other mental and behavioral disorders: Secondary | ICD-10-CM | POA: Diagnosis not present

## 2014-06-24 DIAGNOSIS — R0602 Shortness of breath: Secondary | ICD-10-CM | POA: Diagnosis present

## 2014-06-24 DIAGNOSIS — J40 Bronchitis, not specified as acute or chronic: Secondary | ICD-10-CM

## 2014-06-24 DIAGNOSIS — Z86711 Personal history of pulmonary embolism: Secondary | ICD-10-CM | POA: Diagnosis not present

## 2014-06-24 LAB — COMPREHENSIVE METABOLIC PANEL
ALK PHOS: 95 U/L (ref 39–117)
ALT: 28 U/L (ref 0–53)
AST: 65 U/L — ABNORMAL HIGH (ref 0–37)
Albumin: 4 g/dL (ref 3.5–5.2)
Anion gap: 18 — ABNORMAL HIGH (ref 5–15)
BUN: 4 mg/dL — ABNORMAL LOW (ref 6–23)
CO2: 21 meq/L (ref 19–32)
Calcium: 8.6 mg/dL (ref 8.4–10.5)
Chloride: 103 mEq/L (ref 96–112)
Creatinine, Ser: 0.65 mg/dL (ref 0.50–1.35)
Glucose, Bld: 83 mg/dL (ref 70–99)
POTASSIUM: 4.1 meq/L (ref 3.7–5.3)
SODIUM: 142 meq/L (ref 137–147)
TOTAL PROTEIN: 8.1 g/dL (ref 6.0–8.3)
Total Bilirubin: 0.3 mg/dL (ref 0.3–1.2)

## 2014-06-24 LAB — CBC WITH DIFFERENTIAL/PLATELET
Basophils Absolute: 0.1 10*3/uL (ref 0.0–0.1)
Basophils Relative: 1 % (ref 0–1)
Eosinophils Absolute: 0.1 10*3/uL (ref 0.0–0.7)
Eosinophils Relative: 1 % (ref 0–5)
HCT: 48.4 % (ref 39.0–52.0)
HEMOGLOBIN: 16.6 g/dL (ref 13.0–17.0)
LYMPHS PCT: 37 % (ref 12–46)
Lymphs Abs: 2 10*3/uL (ref 0.7–4.0)
MCH: 35.5 pg — ABNORMAL HIGH (ref 26.0–34.0)
MCHC: 34.3 g/dL (ref 30.0–36.0)
MCV: 103.4 fL — ABNORMAL HIGH (ref 78.0–100.0)
Monocytes Absolute: 0.4 10*3/uL (ref 0.1–1.0)
Monocytes Relative: 7 % (ref 3–12)
NEUTROS PCT: 54 % (ref 43–77)
Neutro Abs: 2.7 10*3/uL (ref 1.7–7.7)
Platelets: 96 10*3/uL — ABNORMAL LOW (ref 150–400)
RBC: 4.68 MIL/uL (ref 4.22–5.81)
RDW: 15.2 % (ref 11.5–15.5)
WBC: 5.3 10*3/uL (ref 4.0–10.5)

## 2014-06-24 LAB — TROPONIN I: Troponin I: <0.3 ng/mL (ref <0.30)

## 2014-06-24 LAB — PRO B NATRIURETIC PEPTIDE: Pro B Natriuretic peptide (BNP): 50.7 pg/mL (ref 0–125)

## 2014-06-24 MED ORDER — AMOXICILLIN 500 MG PO CAPS: 500.0000 mg | ORAL_CAPSULE | Freq: Three times a day (TID) | ORAL | Status: DC

## 2014-06-24 MED ORDER — ALBUTEROL SULFATE HFA 108 (90 BASE) MCG/ACT IN AERS
2.0000 | INHALATION_SPRAY | RESPIRATORY_TRACT | Status: DC | PRN
  Administered 2014-06-24: 2 via RESPIRATORY_TRACT
  Filled 2014-06-24: qty 6.7

## 2014-06-24 MED ORDER — IOHEXOL 350 MG/ML SOLN
100.0000 mL | Freq: Once | INTRAVENOUS | Status: AC | PRN
  Administered 2014-06-24: 100 mL via INTRAVENOUS

## 2014-06-24 MED ORDER — PREDNISONE 20 MG PO TABS: 60.0000 mg | ORAL_TABLET | Freq: Every day | ORAL | Status: DC

## 2014-06-24 NOTE — ED Notes (Signed)
Notified CT that pt has a new IV.

## 2014-06-24 NOTE — ED Provider Notes (Signed)
CSN: [id number]     Arrival date & time 06/24/14  1607 History   First MD Initiated Contact with Patient 06/24/14 1722     Chief Complaint  Patient presents with  . generalized weakness   . Shortness of Breath  . Chills     (Consider location/radiation/quality/duration/timing/severity/associated sxs/prior Treatment) HPI Comments: Patient presents to the ER reporting that he has been experiencing fever, chills, cough and shortness of breath for the last 2 days. Patient reports that his cough has been productive of thick sputum. He feels significant generalized weakness secondary to illness. Patient is not experiencing any chest pain.  Patient is a 48 y.o. male presenting with shortness of breath.  Shortness of Breath Associated symptoms: cough and fever     Past Medical History  Diagnosis Date  . Coronary artery disease   . Degenerative joint disease of spine   . Pulmonary embolism 06/2010  . Degenerative joint disease   . 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 ETOH abuse   . DVT (deep venous thrombosis)   . Hepatitis B   . Mental disorder   . Depression    Past Surgical History  Procedure Laterality Date  . Left elbow surgery    . Tonsillectomy     Family History  Problem Relation Age of Onset  . Cancer Mother     Ovarian   History  Substance Use Topics  . Smoking status: Current Every Day Smoker -- 2.00 packs/day for 30 years    Types: Cigarettes  . Smokeless tobacco: Never Used  . Alcohol Use: 7.2 oz/week    12 Cans of beer per week     Comment: Drinks a 6-pack of beer daily.    Review of Systems  Constitutional: Positive for fever, chills and fatigue.  Respiratory: Positive for cough and shortness of breath.   All other systems reviewed and are negative.     Allergies  Review of patient's allergies indicates no known allergies.  Home Medications   Prior to Admission medications   Medication Sig Start Date End Date Taking? Authorizing Provider   Aspirin-Salicylamide-Caffeine (BC HEADACHE POWDER PO) Take 1 packet by mouth every 6 (six) hours as needed (cold symptoms).    Yes Historical Provider, MD  folic acid (FOLVITE) 1 MG tablet Take 1 tablet (1 mg total) by mouth daily. 06/07/14  Yes Thurnell Lose, MD  nicotine (NICODERM CQ - DOSED IN MG/24 HOURS) 21 mg/24hr patch Place 1 patch (21 mg total) onto the skin daily. 06/07/14  Yes Thurnell Lose, MD  Pseudoeph-Doxylamine-DM-APAP (NYQUIL PO) Take 10 mLs by mouth at bedtime as needed (cough & cold symptoms).   Yes Historical Provider, MD  rivaroxaban (XARELTO) 20 MG TABS tablet Take 1 tablet (20 mg total) by mouth daily with supper. 06/07/14  Yes Thurnell Lose, MD   BP 91/46  Pulse 110  Temp(Src) 98.7 F (37.1 C) (Oral)  Resp 16  SpO2 93% Physical Exam  Constitutional: He is oriented to person, place, and time. He appears well-developed and well-nourished. No distress.  HENT:  Head: Normocephalic and atraumatic.  Right Ear: Hearing normal.  Left Ear: Hearing normal.  Nose: Nose normal.  Mouth/Throat: Oropharynx is clear and moist and mucous membranes are normal.  Eyes: Conjunctivae and EOM are normal. Pupils are equal, round, and reactive to light.  Neck: Normal range of motion. Neck supple.  Cardiovascular: Regular rhythm, S1 normal and S2 normal.  Exam reveals no gallop and no friction rub.  No murmur heard. Pulmonary/Chest: Effort normal. No respiratory distress. He has wheezes. He exhibits no tenderness.  Abdominal: Soft. Normal appearance and bowel sounds are normal. There is no hepatosplenomegaly. There is no tenderness. There is no rebound, no guarding, no tenderness at McBurney's point and negative Murphy's sign. No hernia.  Musculoskeletal: Normal range of motion.  Neurological: He is alert and oriented to person, place, and time. He has normal strength. No cranial nerve deficit or sensory deficit. Coordination normal. GCS eye subscore is 4. GCS verbal subscore is 5.  GCS motor subscore is 6.  Skin: Skin is warm, dry and intact. No rash noted. No cyanosis.  Psychiatric: He has a normal mood and affect. His speech is normal and behavior is normal. Thought content normal.    ED Course  Procedures (including critical care time) Labs Review Labs Reviewed  CBC WITH DIFFERENTIAL - Abnormal; Notable for the following:    MCV 103.4 (*)    MCH 35.5 (*)    Platelets 96 (*)    All other components within normal limits  COMPREHENSIVE METABOLIC PANEL - Abnormal; Notable for the following:    BUN 4 (*)    AST 65 (*)    Anion gap 18 (*)    All other components within normal limits  TROPONIN I  PRO B NATRIURETIC PEPTIDE    Imaging Review Dg Chest 2 View  06/24/2014   CLINICAL DATA:  48 year old male with a history productive cough and shortness of breath for 2 days.  EXAM: CHEST - 2 VIEW  COMPARISON:  Chest CT 06/06/2014, chest CT 12/11/2012, plain film 12/11/2012 and 03/22/2013  FINDINGS: Cardiomediastinal silhouette unchanged in size and contour. No evidence of pulmonary vascular congestion.  No visualized pneumothorax or pleural effusion.  No confluent airspace disease. Prominence of interstitial markings is again seen. Partially calcified nodule in the low left lung was present on prior chest CTs and is compatible with a calcified granuloma.  No displaced fracture.  Unremarkable appearance of the upper abdomen.  IMPRESSION: No radiographic evidence of acute cardiopulmonary disease, with a background of chronic changes and left calcified granuloma.  Signed,  Dulcy Fanny. Earleen Newport, DO  [facility]  [facility]   Electronically Signed   By: Corrie Mckusick D.O.   On: 06/24/2014 19:09   Ct Angio Chest Pe W/cm &/or Wo Cm  06/24/2014   CLINICAL DATA:  48 year old male with acute shortness of breath cough and chills for 2 days. Initial encounter. Current history homeless.  EXAM: CT ANGIOGRAPHY CHEST WITH CONTRAST  TECHNIQUE:  Multidetector CT imaging of the chest was performed using the standard protocol during bolus administration of intravenous contrast. Multiplanar CT image reconstructions and MIPs were obtained to evaluate the vascular anatomy.  CONTRAST:  118mL OMNIPAQUE IOHEXOL 350 MG/ML SOLN  COMPARISON:  Chest CTA 06/06/2014, and earlier.  FINDINGS: Good contrast bolus timing in the pulmonary arterial tree. Mild motion artifact at the lung bases. No focal filling defect identified in the pulmonary arterial tree to suggest the presence of acute pulmonary embolism.  Stable major airway patency. Stable lung volumes. Peribronchial thickening in the medial right upper lobe with some volume loss and stable increased linear markings. Stable scarring or atelectasis also in the lateral basal segment of the right lower lobe. Chronic calcified granuloma superior segment left lower lobe. No acute pulmonary opacity.  No pleural or pericardial effusion. Negative thoracic inlet. No mediastinal or hilar lymphadenopathy. Visualized aorta within normal limits. Stable Visualized liver, spleen,  pancreas, adrenal glands, left renal upper pole, and bowel in the upper abdomen.  Chronic posterior left rib fractures. No acute osseous abnormality identified.  Review of the MIP images confirms the above findings.  IMPRESSION: 1.  No evidence of acute pulmonary embolus. 2. Bronchiectasis and scarring but no acute findings in the chest; stable CTA since 06/06/2014.   Electronically Signed   By: Lars Pinks M.D.   On: 06/24/2014 23:16     EKG Interpretation   Date/Time:  Sunday June 24 2014 18:04:24 EDT Ventricular Rate:  102 PR Interval:  195 QRS Duration: 97 QT Interval:  346 QTC Calculation: 451 R Axis:   92 Text Interpretation:  Sinus tachycardia Borderline right axis deviation  Baseline wander in lead(s) V2 V4 V5 No significant change since last  tracing Confirmed by POLLINA  MD, [place] [phone]) on 06/24/2014  6:35:51 PM      MDM    Final diagnoses:  None   bronchitis  Patient presents to the ER for evaluation of cough, chest congestion, generalized weakness. The patient reports that he is preoperative large amount of sputum. Patient is not hypoxic. He does not appear septic. Workup has been negative. EKG is unchanged from previous. Normal BMP. Normal troponin. Patient does have leukocytosis, no fever. Reviewing her records, patient was recently hospitalized for new DVT. He has a history of lupus anticoagulant disorder. He admits to being noncompliant with medications at times. Because of this, CT angiography was performed to rule out PE. No PE is seen. Patient will be discharged, given an inhaler and prednisone.    Orpah Greek, MD 06/24/14 2329

## 2014-06-24 NOTE — Discharge Instructions (Signed)

## 2014-06-24 NOTE — ED Notes (Signed)
Pt called out requesting water, CNA informed patient he could not have water until this CT results have been reviewed.

## 2014-06-24 NOTE — ED Notes (Signed)
Patient transported to CT 

## 2014-06-24 NOTE — ED Notes (Signed)
Pt homeless and brought in via [facility] c/o generalized weakness, shortness of breath,cough, and chills x 2 days. He also is c/o his chronic sciatica pain.

## 2014-06-24 NOTE — ED Notes (Signed)
Pt went to CT for angio, but IV blew,  Pt returned to room,  Will attempt another IV and call Ct when ready again

## 2014-08-13 ENCOUNTER — Ambulatory Visit: Payer: Self-pay | Admitting: Hematology and Oncology

## 2014-08-13 ENCOUNTER — Other Ambulatory Visit: Payer: Self-pay

## 2014-08-16 ENCOUNTER — Telehealth: Payer: Self-pay | Admitting: *Deleted

## 2014-08-16 ENCOUNTER — Telehealth: Payer: Self-pay | Admitting: Hematology and Oncology

## 2014-08-16 NOTE — Telephone Encounter (Signed)
Please reschedule to another provider due to multiple no show to me. Refill 1 more time X 30 tabs only. And document DO NOT reschedule to me

## 2014-08-16 NOTE — Telephone Encounter (Signed)
Pt did not show for his lab/MD on 08/13/14.  He called scheduler to r/s his missed appts to 08/28/14.  Pt states he only has five Xarelto pills left and needs refill prior to appt on 08/28/14.

## 2014-08-16 NOTE — Telephone Encounter (Signed)
PER PT R/S 12/7 APPT TO 12/22 (1ST AVAIL). PER PT HE WILL RUN OUT OF MEDS BY THEN. DESK INFORMED - PT AWARE. PER PT HE HAS NO CONTACT INFO AND WILL KEEP HIS APPT FOR 12/22. PT HAS 2 NUMBERS FOR FRIENDS LISTED IN THE SYSTEM.

## 2014-08-17 ENCOUNTER — Other Ambulatory Visit: Payer: Self-pay | Admitting: *Deleted

## 2014-08-17 MED ORDER — RIVAROXABAN 20 MG PO TABS: 20.0000 mg | ORAL_TABLET | Freq: Every day | ORAL | Status: DC

## 2014-08-18 ENCOUNTER — Other Ambulatory Visit: Payer: Self-pay | Admitting: *Deleted

## 2014-08-18 MED ORDER — RIVAROXABAN 20 MG PO TABS: 20.0000 mg | ORAL_TABLET | Freq: Every day | ORAL | Status: DC

## 2014-08-28 ENCOUNTER — Other Ambulatory Visit: Payer: Self-pay

## 2014-08-28 ENCOUNTER — Encounter: Payer: Self-pay | Admitting: Hematology and Oncology

## 2014-08-28 ENCOUNTER — Ambulatory Visit: Payer: Self-pay | Admitting: Hematology and Oncology

## 2014-09-16 ENCOUNTER — Emergency Department (HOSPITAL_COMMUNITY)
Admission: EM | Admit: 2014-09-16 | Discharge: 2014-09-17 | Disposition: A | Discharge status: Home / Self Care | Payer: Medicaid Other | Attending: Emergency Medicine | Admitting: Emergency Medicine

## 2014-09-16 ENCOUNTER — Encounter (HOSPITAL_COMMUNITY): Payer: Self-pay | Admitting: *Deleted

## 2014-09-16 DIAGNOSIS — I82402 Acute embolism and thrombosis of unspecified deep veins of left lower extremity: Secondary | ICD-10-CM

## 2014-09-16 DIAGNOSIS — Z720 Tobacco use: Secondary | ICD-10-CM

## 2014-09-16 DIAGNOSIS — Z8619 Personal history of other infectious and parasitic diseases: Secondary | ICD-10-CM | POA: Insufficient documentation

## 2014-09-16 DIAGNOSIS — Z7952 Long term (current) use of systemic steroids: Secondary | ICD-10-CM | POA: Diagnosis not present

## 2014-09-16 DIAGNOSIS — M1990 Unspecified osteoarthritis, unspecified site: Secondary | ICD-10-CM | POA: Insufficient documentation

## 2014-09-16 DIAGNOSIS — R45851 Suicidal ideations: Secondary | ICD-10-CM | POA: Diagnosis present

## 2014-09-16 DIAGNOSIS — Z7901 Long term (current) use of anticoagulants: Secondary | ICD-10-CM | POA: Diagnosis not present

## 2014-09-16 DIAGNOSIS — Z86711 Personal history of pulmonary embolism: Secondary | ICD-10-CM | POA: Insufficient documentation

## 2014-09-16 DIAGNOSIS — F10920 Alcohol use, unspecified with intoxication, uncomplicated: Secondary | ICD-10-CM

## 2014-09-16 DIAGNOSIS — F10120 Alcohol abuse with intoxication, uncomplicated: Secondary | ICD-10-CM | POA: Diagnosis not present

## 2014-09-16 DIAGNOSIS — I2510 Atherosclerotic heart disease of native coronary artery without angina pectoris: Secondary | ICD-10-CM | POA: Insufficient documentation

## 2014-09-16 DIAGNOSIS — I824Z2 Acute embolism and thrombosis of unspecified deep veins of left distal lower extremity: Secondary | ICD-10-CM | POA: Diagnosis not present

## 2014-09-16 DIAGNOSIS — Z792 Long term (current) use of antibiotics: Secondary | ICD-10-CM | POA: Diagnosis not present

## 2014-09-16 DIAGNOSIS — Z79899 Other long term (current) drug therapy: Secondary | ICD-10-CM | POA: Insufficient documentation

## 2014-09-16 DIAGNOSIS — Z8659 Personal history of other mental and behavioral disorders: Secondary | ICD-10-CM | POA: Diagnosis not present

## 2014-09-16 DIAGNOSIS — Z86718 Personal history of other venous thrombosis and embolism: Secondary | ICD-10-CM | POA: Diagnosis not present

## 2014-09-16 LAB — CBC
HCT: 45.5 % (ref 39.0–52.0)
Hemoglobin: 15.1 g/dL (ref 13.0–17.0)
MCH: 36 pg — ABNORMAL HIGH (ref 26.0–34.0)
MCHC: 33.2 g/dL (ref 30.0–36.0)
MCV: 108.3 fL — ABNORMAL HIGH (ref 78.0–100.0)
Platelets: 126 K/uL — ABNORMAL LOW (ref 150–400)
RBC: 4.2 MIL/uL — ABNORMAL LOW (ref 4.22–5.81)
RDW: 15.1 % (ref 11.5–15.5)
WBC: 4.2 K/uL (ref 4.0–10.5)

## 2014-09-16 LAB — COMPREHENSIVE METABOLIC PANEL
ALBUMIN: 3.4 g/dL — AB (ref 3.5–5.2)
ALK PHOS: 100 U/L (ref 39–117)
ALT: 51 U/L (ref 0–53)
ANION GAP: 7 (ref 5–15)
AST: 91 U/L — AB (ref 0–37)
BUN: 7 mg/dL (ref 6–23)
CO2: 21 mmol/L (ref 19–32)
Calcium: 8.3 mg/dL — ABNORMAL LOW (ref 8.4–10.5)
Chloride: 110 mEq/L (ref 96–112)
Creatinine, Ser: 0.76 mg/dL (ref 0.50–1.35)
GFR calc Af Amer: >90 mL/min (ref >90)
Glucose, Bld: 98 mg/dL (ref 70–99)
Potassium: 4 mmol/L (ref 3.5–5.1)
Sodium: 138 mmol/L (ref 135–145)
Total Bilirubin: 0.4 mg/dL (ref 0.3–1.2)
Total Protein: 6.7 g/dL (ref 6.0–8.3)

## 2014-09-16 LAB — ETHANOL: Alcohol, Ethyl (B): 279 mg/dL — ABNORMAL HIGH (ref 0–9)

## 2014-09-16 LAB — RAPID URINE DRUG SCREEN, HOSP PERFORMED
Amphetamines: NOT DETECTED
Barbiturates: NOT DETECTED
Benzodiazepines: NOT DETECTED
Cocaine: NOT DETECTED
Opiates: NOT DETECTED
Tetrahydrocannabinol: NOT DETECTED

## 2014-09-16 LAB — ACETAMINOPHEN LEVEL: Acetaminophen (Tylenol), Serum: <10 ug/mL — ABNORMAL LOW (ref 10–30)

## 2014-09-16 LAB — SALICYLATE LEVEL

## 2014-09-16 MED ORDER — RIVAROXABAN 20 MG PO TABS
20.0000 mg | ORAL_TABLET | Freq: Every day | ORAL | Status: DC
  Administered 2014-09-16: 20 mg via ORAL
  Filled 2014-09-16 (×3): qty 1

## 2014-09-16 MED ORDER — LORAZEPAM 1 MG PO TABS
0.0000 mg | ORAL_TABLET | Freq: Four times a day (QID) | ORAL | Status: DC
  Administered 2014-09-17: 1 mg via ORAL
  Administered 2014-09-17: 2 mg via ORAL
  Filled 2014-09-16: qty 2
  Filled 2014-09-16: qty 1

## 2014-09-16 MED ORDER — NICOTINE 21 MG/24HR TD PT24
21.0000 mg | MEDICATED_PATCH | Freq: Every day | TRANSDERMAL | Status: DC
  Administered 2014-09-16 – 2014-09-17 (×2): 21 mg via TRANSDERMAL
  Filled 2014-09-16 (×2): qty 1

## 2014-09-16 MED ORDER — LORAZEPAM 1 MG PO TABS: 0.0000 mg | ORAL_TABLET | Freq: Two times a day (BID) | ORAL | Status: DC

## 2014-09-16 MED ORDER — THIAMINE HCL 100 MG/ML IJ SOLN: 100.0000 mg | Freq: Every day | INTRAMUSCULAR | Status: DC

## 2014-09-16 MED ORDER — VITAMIN B-1 100 MG PO TABS
100.0000 mg | ORAL_TABLET | Freq: Every day | ORAL | Status: DC
  Administered 2014-09-16 – 2014-09-17 (×2): 100 mg via ORAL
  Filled 2014-09-16 (×2): qty 1

## 2014-09-16 NOTE — ED Notes (Signed)
TTS in with patient.

## 2014-09-16 NOTE — BH Assessment (Signed)
Dr. Regenia Skeeter was unable to pick up phone when contacted by this writer. Reviewed note prior to initiating assessment. Will complete assessment and follow up with Dr. Regenia Skeeter to avoid unnecessary delay in pt care.    Lear Ng, [facility] Triage Specialist 09/16/2014 8:38 PM
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
## 2014-09-16 NOTE — ED Notes (Signed)
Patient appears anxious. Reports SI stating that he knows he is going to die and would "rather get it over with than deal with pain". States that he doesn't want to go through pain experienced from a previous surgery again. Patient has continued pain r/t left leg/blood clots. Patient reports HI stating that he "may as well take the people who aggravate me out with me". Patient endorses hopelessness with on going and increasing feelings of anxiety and depression. Patient feels he cannot take care of himself and feels he has nothing to live for; requests help. Reports that he cannot remember where all of his prescriptions are. States that pharmacies have called the police on him when he tried to inquire about his prescriptions. Patient reports poor sleep. States that he often wakes up fighting as he hears people threatening him and believes its real at that time. Reports poor appetite in recent months also.  Encouragement offered. Patient declines snack at present.  Q 15 safety checks in place.

## 2014-09-16 NOTE — ED Provider Notes (Signed)
CSN: [id number]     Arrival date & time 09/16/14  1747 History   First MD Initiated Contact with Patient 09/16/14 1849     Chief Complaint  Patient presents with  . Suicidal  . Homicidal     (Consider location/radiation/quality/duration/timing/severity/associated sxs/prior Treatment) HPI  49 year old male presents with suicidal or homicidal thoughts. This is been going on for a couple weeks. He states that he has hallucinations as well, will be talking to people and after 5-10 minutes rest they're not there. These people make him angry and seemed indicate he is to kill himself. His plan is to hang himself. He's been on Xarelto for the last couple years due to a left leg DVT but has not taken this in the last 2-1/2 weeks. He states there is a complication with the pharmacy he thought the Xarelto was going to be at and then he got arrested for cursing at the pharmacist when he can get his medicines. Over the last couple days he is noticed left leg swelling again. Denies shortness of breath or chest pain.  Past Medical History  Diagnosis Date  . Coronary artery disease   . Degenerative joint disease of spine   . Pulmonary embolism 06/2010  . Degenerative joint disease   . 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 ETOH abuse   . DVT (deep venous thrombosis)   . Hepatitis B   . Mental disorder   . Depression    Past Surgical History  Procedure Laterality Date  . Left elbow surgery    . Tonsillectomy     Family History  Problem Relation Age of Onset  . Cancer Mother     Ovarian   History  Substance Use Topics  . Smoking status: Current Every Day Smoker -- 2.00 packs/day for 30 years    Types: Cigarettes  . Smokeless tobacco: Never Used  . Alcohol Use: 7.2 oz/week    12 Cans of beer per week     Comment: Drinks a 6-pack of beer daily.    Review of Systems  Respiratory: Negative for shortness of breath.   Cardiovascular: Positive for leg swelling. Negative for chest pain.   Psychiatric/Behavioral: Positive for suicidal ideas and hallucinations. Negative for self-injury.       Allergies  Review of patient's allergies indicates no known allergies.  Home Medications   Prior to Admission medications   Medication Sig Start Date End Date Taking? Authorizing Provider  rivaroxaban (XARELTO) 20 MG TABS tablet Take 1 tablet (20 mg total) by mouth daily with supper. 08/18/14  Yes Heath Lark, MD  amoxicillin (AMOXIL) 500 MG capsule Take 1 capsule (500 mg total) by mouth 3 (three) times daily. Patient not taking: Reported on 09/16/2014 06/24/14   Orpah Greek, MD  Aspirin-Salicylamide-Caffeine [facility] HEADACHE POWDER PO) Take 1 packet by mouth every 6 (six) hours as needed (cold symptoms).     Historical Provider, MD  folic acid (FOLVITE) 1 MG tablet Take 1 tablet (1 mg total) by mouth daily. 06/07/14   Thurnell Lose, MD  nicotine (NICODERM CQ - DOSED IN MG/24 HOURS) 21 mg/24hr patch Place 1 patch (21 mg total) onto the skin daily. 06/07/14   Thurnell Lose, MD  predniSONE (DELTASONE) 20 MG tablet Take 3 tablets (60 mg total) by mouth daily with breakfast. Patient not taking: Reported on 09/16/2014 06/24/14   Orpah Greek, MD  Pseudoeph-Doxylamine-DM-APAP (NYQUIL PO) Take 10 mLs by mouth at bedtime as needed (cough & cold symptoms).  Historical Provider, MD   BP 94/63 mmHg  Pulse 87  Temp(Src) 97.9 F (36.6 C) (Oral)  Resp 20  SpO2 93% Physical Exam  Constitutional: He is oriented to person, place, and time. He appears well-developed and well-nourished.  HENT:  Head: Normocephalic and atraumatic.  Right Ear: External ear normal.  Left Ear: External ear normal.  Nose: Nose normal.  Eyes: Right eye exhibits no discharge. Left eye exhibits no discharge.  Neck: Neck supple.  Cardiovascular: Normal rate, regular rhythm, normal heart sounds and intact distal pulses.   Pulmonary/Chest: Effort normal.  Abdominal: Soft. There is no tenderness.   Musculoskeletal: He exhibits no edema or tenderness.  Left calf visibly swollen compared to right. No erythema. No palpable cords. Not tender  Neurological: He is alert and oriented to person, place, and time.  Skin: Skin is warm and dry.  Nursing note and vitals reviewed.   ED Course  Procedures (including critical care time) Labs Review Labs Reviewed  ACETAMINOPHEN LEVEL - Abnormal; Notable for the following:    Acetaminophen (Tylenol), Serum <10.0 (*)    All other components within normal limits  CBC - Abnormal; Notable for the following:    RBC 4.20 (*)    MCV 108.3 (*)    MCH 36.0 (*)    Platelets 126 (*)    All other components within normal limits  COMPREHENSIVE METABOLIC PANEL - Abnormal; Notable for the following:    Calcium 8.3 (*)    Albumin 3.4 (*)    AST 91 (*)    All other components within normal limits  ETHANOL - Abnormal; Notable for the following:    Alcohol, Ethyl (B) 279 (*)    All other components within normal limits  SALICYLATE LEVEL  URINE RAPID DRUG SCREEN (HOSP PERFORMED)    Imaging Review No results found.   EKG Interpretation None      MDM   Final diagnoses:  Alcohol intoxication, uncomplicated    Patient is currently intoxicated but states he is suicidal and homicidal. From this perspective psychiatry has seen him and is looking for placement. 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 At this time of night we do not have DVT ultrasound. He will be in the ER overnight and thus we'll get an ultrasound in the morning. He has no chest pain or shortness of breath, no hypoxia, I do not feel he needs workup for pulmonary embolism. His is likely due to him not being on the Xarelto the last couple weeks, started act on Xarelto tonight. Will need placement for his psychiatric issues.    Ephraim Hamburger, MD 09/16/14 [phone]
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
## 2014-09-16 NOTE — ED Notes (Signed)
Pt reported to ems that he heard some guys talking about him and he was going to kill them and then kill himself. [facility] was called, attempted to transport pt to [facility] but they have no beds available. Pt reported to EMS that he has not taken any of his meds in 2.5 weeks.

## 2014-09-16 NOTE — BH Assessment (Signed)
Per Dr. Regenia Skeeter pt needs to restart Xarelto for hx of DVT. Pt will have ultrasound tomorrow to confirm DVT.   Lear Ng, [facility] Triage Specialist 09/16/2014 9:34 PM

## 2014-09-16 NOTE — BH Assessment (Signed)
Attempted again to reach Dr. Regenia Skeeter to clarify if pt is medically cleared for placement.    Lear Ng, [facility] Triage Specialist 09/16/2014 9:17 PM

## 2014-09-17 ENCOUNTER — Inpatient Hospital Stay (HOSPITAL_COMMUNITY)
Admission: AD | Admit: 2014-09-17 | Discharge: 2014-09-25 | DRG: 885 | Disposition: A | Discharge status: Rehabilitation Facility | Payer: Medicaid Other | Source: Intra-hospital | Attending: Psychiatry | Admitting: Psychiatry

## 2014-09-17 ENCOUNTER — Encounter (HOSPITAL_COMMUNITY): Payer: Self-pay

## 2014-09-17 DIAGNOSIS — Z811 Family history of alcohol abuse and dependence: Secondary | ICD-10-CM

## 2014-09-17 DIAGNOSIS — I82409 Acute embolism and thrombosis of unspecified deep veins of unspecified lower extremity: Secondary | ICD-10-CM | POA: Insufficient documentation

## 2014-09-17 DIAGNOSIS — F410 Panic disorder [episodic paroxysmal anxiety] without agoraphobia: Secondary | ICD-10-CM | POA: Diagnosis present

## 2014-09-17 DIAGNOSIS — F1020 Alcohol dependence, uncomplicated: Secondary | ICD-10-CM | POA: Diagnosis present

## 2014-09-17 DIAGNOSIS — Z86711 Personal history of pulmonary embolism: Secondary | ICD-10-CM

## 2014-09-17 DIAGNOSIS — F323 Major depressive disorder, single episode, severe with psychotic features: Principal | ICD-10-CM | POA: Diagnosis present

## 2014-09-17 DIAGNOSIS — F411 Generalized anxiety disorder: Secondary | ICD-10-CM | POA: Diagnosis present

## 2014-09-17 DIAGNOSIS — F1014 Alcohol abuse with alcohol-induced mood disorder: Secondary | ICD-10-CM | POA: Diagnosis present

## 2014-09-17 DIAGNOSIS — F10120 Alcohol abuse with intoxication, uncomplicated: Secondary | ICD-10-CM | POA: Diagnosis not present

## 2014-09-17 DIAGNOSIS — G4700 Insomnia, unspecified: Secondary | ICD-10-CM | POA: Diagnosis present

## 2014-09-17 DIAGNOSIS — F332 Major depressive disorder, recurrent severe without psychotic features: Secondary | ICD-10-CM

## 2014-09-17 DIAGNOSIS — F10929 Alcohol use, unspecified with intoxication, unspecified: Secondary | ICD-10-CM | POA: Insufficient documentation

## 2014-09-17 DIAGNOSIS — R45851 Suicidal ideations: Secondary | ICD-10-CM

## 2014-09-17 DIAGNOSIS — F17210 Nicotine dependence, cigarettes, uncomplicated: Secondary | ICD-10-CM | POA: Diagnosis present

## 2014-09-17 DIAGNOSIS — M1990 Unspecified osteoarthritis, unspecified site: Secondary | ICD-10-CM | POA: Diagnosis present

## 2014-09-17 DIAGNOSIS — I2510 Atherosclerotic heart disease of native coronary artery without angina pectoris: Secondary | ICD-10-CM | POA: Diagnosis present

## 2014-09-17 DIAGNOSIS — F10239 Alcohol dependence with withdrawal, unspecified: Secondary | ICD-10-CM | POA: Diagnosis present

## 2014-09-17 MED ORDER — LOPERAMIDE HCL 2 MG PO CAPS: 2.0000 mg | ORAL_CAPSULE | ORAL | Status: DC | PRN

## 2014-09-17 MED ORDER — RIVAROXABAN 15 MG PO TABS: 15.0000 mg | ORAL_TABLET | Freq: Once | ORAL | Status: DC

## 2014-09-17 MED ORDER — CHLORDIAZEPOXIDE HCL 25 MG PO CAPS
25.0000 mg | ORAL_CAPSULE | Freq: Three times a day (TID) | ORAL | Status: AC
  Administered 2014-09-19 – 2014-09-20 (×3): 25 mg via ORAL
  Filled 2014-09-17 (×3): qty 1

## 2014-09-17 MED ORDER — ONDANSETRON 4 MG PO TBDP
4.0000 mg | ORAL_TABLET | Freq: Three times a day (TID) | ORAL | Status: DC | PRN
  Administered 2014-09-17: 4 mg via ORAL
  Filled 2014-09-17: qty 1

## 2014-09-17 MED ORDER — ONDANSETRON 4 MG PO TBDP: 4.0000 mg | ORAL_TABLET | Freq: Four times a day (QID) | ORAL | Status: DC | PRN

## 2014-09-17 MED ORDER — MAGNESIUM HYDROXIDE 400 MG/5ML PO SUSP
30.0000 mL | Freq: Every day | ORAL | Status: DC | PRN
Start: 2014-09-17 — End: 2014-09-17

## 2014-09-17 MED ORDER — CHLORDIAZEPOXIDE HCL 25 MG PO CAPS
25.0000 mg | ORAL_CAPSULE | Freq: Every day | ORAL | Status: AC
  Administered 2014-09-22: 25 mg via ORAL
  Filled 2014-09-17: qty 1

## 2014-09-17 MED ORDER — ALUM & MAG HYDROXIDE-SIMETH 200-200-20 MG/5ML PO SUSP: 30.0000 mL | ORAL | Status: DC | PRN

## 2014-09-17 MED ORDER — RIVAROXABAN 20 MG PO TABS: 20.0000 mg | ORAL_TABLET | Freq: Every day | ORAL | Status: DC

## 2014-09-17 MED ORDER — THIAMINE HCL 100 MG/ML IJ SOLN: 100.0000 mg | Freq: Once | INTRAMUSCULAR | Status: DC

## 2014-09-17 MED ORDER — MAGNESIUM HYDROXIDE 400 MG/5ML PO SUSP: 30.0000 mL | Freq: Every day | ORAL | Status: DC | PRN

## 2014-09-17 MED ORDER — RIVAROXABAN 15 MG PO TABS
15.0000 mg | ORAL_TABLET | Freq: Two times a day (BID) | ORAL | Status: DC
Start: 2014-09-18 — End: 2014-09-25
  Administered 2014-09-18 – 2014-09-24 (×14): 15 mg via ORAL
  Filled 2014-09-17 (×17): qty 1

## 2014-09-17 MED ORDER — ACETAMINOPHEN 325 MG PO TABS: 650.0000 mg | ORAL_TABLET | Freq: Four times a day (QID) | ORAL | Status: DC | PRN

## 2014-09-17 MED ORDER — RIVAROXABAN 15 MG PO TABS
15.0000 mg | ORAL_TABLET | Freq: Two times a day (BID) | ORAL | Status: DC
  Administered 2014-09-17: 15 mg via ORAL
  Filled 2014-09-17 (×2): qty 1

## 2014-09-17 MED ORDER — VITAMIN B-1 100 MG PO TABS
100.0000 mg | ORAL_TABLET | Freq: Every day | ORAL | Status: DC
  Administered 2014-09-18 – 2014-09-24 (×7): 100 mg via ORAL
  Filled 2014-09-17 (×9): qty 1

## 2014-09-17 MED ORDER — LOPERAMIDE HCL 2 MG PO CAPS
2.0000 mg | ORAL_CAPSULE | ORAL | Status: DC | PRN
  Administered 2014-09-17: 2 mg via ORAL
  Filled 2014-09-17: qty 1

## 2014-09-17 MED ORDER — HYDROXYZINE HCL 25 MG PO TABS
25.0000 mg | ORAL_TABLET | Freq: Four times a day (QID) | ORAL | Status: AC | PRN
  Administered 2014-09-17 – 2014-09-20 (×7): 25 mg via ORAL
  Filled 2014-09-17 (×7): qty 1

## 2014-09-17 MED ORDER — CHLORDIAZEPOXIDE HCL 25 MG PO CAPS: 25.0000 mg | ORAL_CAPSULE | Freq: Four times a day (QID) | ORAL | Status: AC | PRN

## 2014-09-17 MED ORDER — LOPERAMIDE HCL 2 MG PO CAPS: 2.0000 mg | ORAL_CAPSULE | ORAL | Status: AC | PRN

## 2014-09-17 MED ORDER — TRAZODONE HCL 50 MG PO TABS
50.0000 mg | ORAL_TABLET | Freq: Every evening | ORAL | Status: DC | PRN
  Administered 2014-09-17 – 2014-09-18 (×2): 50 mg via ORAL
  Filled 2014-09-17 (×8): qty 1

## 2014-09-17 MED ORDER — CHLORDIAZEPOXIDE HCL 25 MG PO CAPS
25.0000 mg | ORAL_CAPSULE | ORAL | Status: AC
  Administered 2014-09-20 – 2014-09-21 (×2): 25 mg via ORAL
  Filled 2014-09-17 (×2): qty 1

## 2014-09-17 MED ORDER — ONDANSETRON 4 MG PO TBDP: 4.0000 mg | ORAL_TABLET | Freq: Three times a day (TID) | ORAL | Status: DC | PRN

## 2014-09-17 MED ORDER — ADULT MULTIVITAMIN W/MINERALS CH
1.0000 | ORAL_TABLET | Freq: Every day | ORAL | Status: DC
  Administered 2014-09-18 – 2014-09-24 (×7): 1 via ORAL
  Filled 2014-09-17 (×9): qty 1

## 2014-09-17 MED ORDER — CHLORDIAZEPOXIDE HCL 25 MG PO CAPS
25.0000 mg | ORAL_CAPSULE | Freq: Four times a day (QID) | ORAL | Status: AC
  Administered 2014-09-17 – 2014-09-19 (×6): 25 mg via ORAL
  Filled 2014-09-17 (×6): qty 1

## 2014-09-17 NOTE — Progress Notes (Signed)
Bilateral lower extremity venous duplex completed.  Right:  No evidence of DVT, superficial thrombosis, or Baker's cyst.  Left: DVT noted in the common femoral, profunda, femoral, and popliteal veins.  No evidence of superficial thrombosis.  No Baker's cyst.
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
## 2014-09-17 NOTE — ED Notes (Signed)
Patient reports SI, HI at present. States he has had no AVH today. Rates feelings of anxiety 7/10, depression 9/10. Patient is cooperative. Appears tremulous.  Encouragement offered.   Q 15 safety checks in place.
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
## 2014-09-17 NOTE — BH Assessment (Signed)
TTS will contact other facilities for placement later today when ultrasound results are complete.  Orpah Greek Rosana Hoes, [facility] Triage Specialist [phone]

## 2014-09-17 NOTE — ED Provider Notes (Signed)
Pt started on initiation dose of Xarelto for DVT. He is not in Respiratory distress. Doubt complicated VTE. HE is stable for admission to a Psychiatric Facility.  Richarda Blade, MD 09/17/14 [phone]

## 2014-09-17 NOTE — ED Notes (Signed)
Spoke with EDP, Dr. Eulis Foster.  Per Dr. Eulis Foster, patient is medically cleared to go to [facility].  He states the DVT will be addressed on an outpatient basis starting with an increase in Xerelto to 15 mg BID.  He was previously on 20 mg daily.  Call to Letitia Libra, RN [facility] with updated information.

## 2014-09-17 NOTE — ED Notes (Signed)
EDP notified of positive doppler study on left lower extremity.

## 2014-09-18 ENCOUNTER — Encounter (HOSPITAL_COMMUNITY): Payer: Self-pay | Admitting: Psychiatry

## 2014-09-18 DIAGNOSIS — F411 Generalized anxiety disorder: Secondary | ICD-10-CM | POA: Diagnosis present

## 2014-09-18 DIAGNOSIS — R45851 Suicidal ideations: Secondary | ICD-10-CM

## 2014-09-18 DIAGNOSIS — F332 Major depressive disorder, recurrent severe without psychotic features: Secondary | ICD-10-CM | POA: Diagnosis present

## 2014-09-18 DIAGNOSIS — R45850 Homicidal ideations: Secondary | ICD-10-CM

## 2014-09-18 DIAGNOSIS — F1099 Alcohol use, unspecified with unspecified alcohol-induced disorder: Secondary | ICD-10-CM

## 2014-09-18 DIAGNOSIS — F321 Major depressive disorder, single episode, moderate: Secondary | ICD-10-CM

## 2014-09-18 LAB — HEMOGLOBIN A1C
Hgb A1c MFr Bld: 5.2 % (ref <5.7)
MEAN PLASMA GLUCOSE: 103 mg/dL (ref <117)

## 2014-09-18 LAB — LIPID PANEL
CHOLESTEROL: 199 mg/dL (ref 0–200)
HDL: 94 mg/dL (ref >39)
LDL Cholesterol: 81 mg/dL (ref 0–99)
Total CHOL/HDL Ratio: 2.1 RATIO
Triglycerides: 120 mg/dL (ref <150)
VLDL: 24 mg/dL (ref 0–40)

## 2014-09-18 LAB — TSH: TSH: 4.643 u[IU]/mL — ABNORMAL HIGH (ref 0.350–4.500)

## 2014-09-18 NOTE — Plan of Care (Signed)
Problem: Consults Goal: Depression Patient Education See Patient Education Module for education specifics.  Outcome: Completed/Met Date Met:  09/18/14 Nurse discussed depression with patient.

## 2014-09-18 NOTE — BHH Suicide Risk Assessment (Signed)
[facility] INPATIENT:  Family/Significant Other Suicide Prevention Education  Suicide Prevention Education:  Patient Refusal for Family/Significant Other Suicide Prevention Education: The patient [name] has refused to provide written consent for family/significant other to be provided Family/Significant Other Suicide Prevention Education during admission and/or prior to discharge.  Physician notified.  Concha Pyo 09/18/2014, 1:06 PM

## 2014-09-18 NOTE — BHH Suicide Risk Assessment (Signed)
Suicide Risk Assessment  Admission Assessment     Nursing information obtained from:    Demographic factors:    Current Mental Status:    Loss Factors:    Historical Factors:    Risk Reduction Factors:    Total Time spent with patient: 45 minutes  CLINICAL FACTORS:   Depression:   Comorbid alcohol abuse/dependence Hopelessness Severe Alcohol/Substance Abuse/Dependencies  COGNITIVE FEATURES THAT CONTRIBUTE TO RISK:  Closed-mindedness Polarized thinking Thought constriction (tunnel vision)    SUICIDE RISK:   Moderate:  Frequent suicidal ideation with limited intensity, and duration, some specificity in terms of plans, no associated intent, good self-control, limited dysphoria/symptomatology, some risk factors present, and identifiable protective factors, including available and accessible social support.  PLAN OF CARE:  Supportive approach/coping skills/relapse prevention                               [facility] detox protocol                               Reassess and address the co morbidities                               Identify need for a residential treatment program  I certify that inpatient services furnished can reasonably be expected to improve the patient's condition.  [place] A 09/18/2014, 5:06 PM
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
## 2014-09-18 NOTE — Progress Notes (Signed)
Pt attended spiritual care group on grief and loss facilitated by Counseling intern Martinique Austin and chaplain Jerene Pitch. Group opened with brief discussion and psycho-social ed around grief and loss in relationships and in relation to self - identifying life patterns, circumstances, changes that cause losses. Established group norm of speaking from own life experience. Group goal of establishing open and affirming space for members to share loss and experience with grief, normalize grief experience and provide psycho social education and grief support.  Group drew on narrative and Alderian therapeutic modalities.    Blessing did not contribute to group discussion.  He left room once and returned.    [place], [place]

## 2014-09-18 NOTE — Progress Notes (Signed)
D:  Patient's self inventory sheet, patient has fair sleep, sleep medication was helpful.  Poor appetite, low energy level, poor concentration.  Rated depression and hopeless 8, anxiety 7.  Withdrawals of tremors, diarrhea, chilling, agitation, nausea.  Denied SI.  Physical problems of rash, blurred vision, lightheaded.  Experienced leg pain, worst pain #3.  Goal is to "not thinking".  Does have discharge plans.  Desires to be accepted into rehab.   A:  Medications administered per MD orders.  Emotional support and encouragement given patient. R:  Denied SI and HI, contracts for safety.  Denied A/V hallucinations.  Safety maintained with 15 minute checks.

## 2014-09-18 NOTE — B[facility] Group Notes (Signed)
The focus of this group is to educate the patient on the purpose and policies of crisis stabilization and provide a format to answer questions about their admission.  The group details unit policies and expectations of patients while admitted.  Patient did not attend 0900 nurse education orientation group this morning.  Patient met with MD at this time.
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
## 2014-09-18 NOTE — Progress Notes (Signed)
Recreation Therapy Notes  Animal-Assisted Activity/Therapy (AAA/T) Program Checklist/Progress Notes Patient Eligibility Criteria Checklist & Daily Group note for Rec Tx Intervention  Date: 01.12.2016 Time: 2:45pm Location: [address]   AAA/T Program Assumption of Risk Form signed by Patient/ or Parent Legal Guardian yes  Patient is free of allergies or sever asthma yes  Patient reports no fear of animals yes  Patient reports no history of cruelty to animals yes  Patient understands his/her participation is voluntary yes  Patient washes hands before animal contact yes  Patient washes hands after animal contact yes  Behavioral Response: Appropriate   Education: Hand Washing, Appropriate Animal Interaction   Education Outcome: Acknowledges education.   Clinical Observations/Feedback: Patient interacted appropriately with peers and therapy dog during session.   [name] [name], LRT/CTRS  Lane Hacker 09/18/2014 4:17 PM

## 2014-09-19 MED ORDER — NICOTINE 21 MG/24HR TD PT24
21.0000 mg | MEDICATED_PATCH | Freq: Every day | TRANSDERMAL | Status: DC
  Administered 2014-09-19 – 2014-09-24 (×6): 21 mg via TRANSDERMAL
  Filled 2014-09-19 (×9): qty 1

## 2014-09-19 MED ORDER — TRAZODONE HCL 100 MG PO TABS
100.0000 mg | ORAL_TABLET | Freq: Every evening | ORAL | Status: DC | PRN
  Administered 2014-09-19 – 2014-09-20 (×3): 100 mg via ORAL
  Filled 2014-09-19 (×11): qty 1

## 2014-09-19 MED ORDER — CITALOPRAM HYDROBROMIDE 10 MG PO TABS
10.0000 mg | ORAL_TABLET | Freq: Every day | ORAL | Status: DC
  Administered 2014-09-19 – 2014-09-21 (×3): 10 mg via ORAL
  Filled 2014-09-19 (×6): qty 1

## 2014-09-19 NOTE — Progress Notes (Signed)
D   Pt is having moderate tremors and reports feeling bad due to withdrawal symptoms   He mostly isolates in his room  He is logical and coherent    A   Educate on withdrawal and detox protocal    Verbal support given  Medications administered and effectiveness monitored   Q 15 min checks R   Pt safe at present and verbalized understanding

## 2014-09-19 NOTE — Progress Notes (Signed)
[facility] MD Progress Note  09/19/2014 4:31 PM [name]  MRN:  [id number] Subjective: [name] states he is having a hard time. He did not sleep too well last night. He still feels down, depressed, irritable. He had the experience of waking up to his roommate doing "stuff" but states it might no thave been true as he has had those experiences before of seeing things that were not there. He is concerned about his ability of getting the Xarelto. According to the chart he got a last prescription Dec the 20 and they were going to find another provider for him at the cancer center as he had missed multiple appointments. He admits he is worried as does not want to die Diagnosis:   DSM5: Substance/Addictive Disorders:  Alcohol Related Disorder - Severe (303.90) Depressive Disorders:  Major Depressive Disorder - Severe (296.23) Total Time spent with patient: 30 minutes  Axis I: Substance Induced Mood Disorder  ADL's:  Intact  Sleep: Poor  Appetite:  Fair Psychiatric Specialty Exam: Physical Exam  Review of Systems  Constitutional: Positive for malaise/fatigue.  HENT: Negative.   Eyes: Negative.   Respiratory: Negative.   Cardiovascular: Negative.   Gastrointestinal: Negative.   Genitourinary: Negative.   Musculoskeletal: Negative.   Skin: Negative.   Neurological: Positive for weakness.  Endo/Heme/Allergies: Negative.   Psychiatric/Behavioral: Positive for depression. The patient is nervous/anxious and has insomnia.     Blood pressure 101/73, pulse 106, temperature 97.6 F (36.4 C), temperature source Oral, resp. rate 19, height 5' 11.5" (1.816 m), weight 67.132 kg (148 lb).Body mass index is 20.36 kg/(m^2).  General Appearance: Disheveled  Eye Sport and exercise psychologist::  Fair  Speech:  Clear and Coherent, Slow and not spontaneous  Volume:  Decreased  Mood:  Anxious and Depressed  Affect:  Restricted  Thought Process:  Coherent and Goal Directed  Orientation:  Full (Time, Place, and Person)  Thought  Content:  symptoms events worries concerns  Suicidal Thoughts:  No  Homicidal Thoughts:  No  Memory:  Immediate;   Fair Recent;   Fair Remote;   Fair  Judgement:  Fair  Insight:  Present and Shallow  Psychomotor Activity:  Restlessness  Concentration:  Fair  Recall:  [employer] of Knowledge:Fair  Language: Fair  Akathisia:  No  Handed:  Right  AIMS (if indicated):     Assets:  Desire for Improvement  Sleep:  Number of Hours: 6.75   Musculoskeletal: Strength & Muscle Tone: within normal limits Gait & Station: normal Patient leans: N/A  Current Medications: Current Facility-Administered Medications  Medication Dose Route Frequency Provider Last Rate Last Dose  . alum & mag hydroxide-simeth (MAALOX/MYLANTA) 200-200-20 MG/5ML suspension 30 mL  30 mL Oral Q4H PRN Delfin Gant, NP      . chlordiazePOXIDE (LIBRIUM) capsule 25 mg  25 mg Oral Q6H PRN Laverle Hobby, PA-C      . chlordiazePOXIDE (LIBRIUM) capsule 25 mg  25 mg Oral TID Laverle Hobby, PA-C   25 mg at 09/19/14 1208   Followed by  . [START ON 09/20/2014] chlordiazePOXIDE (LIBRIUM) capsule 25 mg  25 mg Oral BH-qamhs Spencer E Simon, PA-C       Followed by  . [START ON 09/22/2014] chlordiazePOXIDE (LIBRIUM) capsule 25 mg  25 mg Oral Daily Laverle Hobby, PA-C      . hydrOXYzine (ATARAX/VISTARIL) tablet 25 mg  25 mg Oral Q6H PRN Laverle Hobby, PA-C   25 mg at 09/19/14 1425  . loperamide (IMODIUM) capsule  2-4 mg  2-4 mg Oral PRN Laverle Hobby, PA-C      . magnesium hydroxide (MILK OF MAGNESIA) suspension 30 mL  30 mL Oral Daily PRN Delfin Gant, NP      . multivitamin with minerals tablet 1 tablet  1 tablet Oral Daily Laverle Hobby, PA-C   1 tablet at 09/19/14 0835  . nicotine (NICODERM CQ - dosed in mg/24 hours) patch 21 mg  21 mg Transdermal Q0600 Nicholaus Bloom, MD   21 mg at 09/19/14 1258  . ondansetron (ZOFRAN-ODT) disintegrating tablet 4 mg  4 mg Oral Q8H PRN Delfin Gant, NP      . Rivaroxaban  (XARELTO) tablet 15 mg  15 mg Oral BID WC Delfin Gant, NP   15 mg at 09/19/14 0836  . [START ON 10/09/2014] rivaroxaban (XARELTO) tablet 20 mg  20 mg Oral Q breakfast Delfin Gant, NP      . thiamine (VITAMIN B-1) tablet 100 mg  100 mg Oral Daily Laverle Hobby, PA-C   100 mg at 09/19/14 0835  . traZODone (DESYREL) tablet 100 mg  100 mg Oral QHS,MR X 1 Nicholaus Bloom, MD        Lab Results:  Results for orders placed or performed during the hospital encounter of 09/17/14 (from the past 48 hour(s))  TSH     Status: Abnormal   Collection Time: 09/18/14  6:15 AM  Result Value Ref Range   TSH 4.643 (H) 0.350 - 4.500 uIU/mL    Comment: Performed at [facility]  Lipid panel     Status: None   Collection Time: 09/18/14  6:15 AM  Result Value Ref Range   Cholesterol 199 0 - 200 mg/dL   Triglycerides 120 <150 mg/dL   HDL 94 >39 mg/dL   Total CHOL/HDL Ratio 2.1 RATIO   VLDL 24 0 - 40 mg/dL   LDL Cholesterol 81 0 - 99 mg/dL    Comment:        Total Cholesterol/HDL:CHD Risk Coronary Heart Disease Risk Table                     Men   Women  1/2 Average Risk   3.4   3.3  Average Risk       5.0   4.4  2 X Average Risk   9.6   7.1  3 X Average Risk  23.4   11.0        Use the calculated Patient Ratio above and the CHD Risk Table to determine the patient's CHD Risk.        ATP III CLASSIFICATION (LDL):  <100     mg/dL   Optimal  100-129  mg/dL   Near or Above                    Optimal  130-159  mg/dL   Borderline  160-189  mg/dL   High  >190     mg/dL   Very High Performed at [facility]   Hemoglobin A1c     Status: None   Collection Time: 09/18/14  6:15 AM  Result Value Ref Range   Hgb A1c MFr Bld 5.2 <5.7 %    Comment: (NOTE)  According to the ADA Clinical Practice Recommendations for 2011, when HbA1c is used as a screening test:  >=6.5%   Diagnostic of Diabetes Mellitus            (if abnormal result is confirmed) 5.7-6.4%   Increased risk of developing Diabetes Mellitus References:Diagnosis and Classification of Diabetes Mellitus,Diabetes TKZS,0109,32(TFTDD 1):S62-S69 and Standards of Medical Care in         Diabetes - 2011,Diabetes UKGU,5427,06 (Suppl 1):S11-S61.    Mean Plasma Glucose 103 <117 mg/dL    Comment: Performed at [facility]    Physical Findings: AIMS: Facial and Oral Movements Muscles of Facial Expression: None, normal Lips and Perioral Area: None, normal Jaw: None, normal Tongue: None, normal,Extremity Movements Upper (arms, wrists, hands, fingers): None, normal Lower (legs, knees, ankles, toes): None, normal, Trunk Movements Neck, shoulders, hips: None, normal, Overall Severity Severity of abnormal movements (highest score from questions above): None, normal Incapacitation due to abnormal movements: None, normal Patient's awareness of abnormal movements (rate only patient's report): No Awareness, Dental Status Current problems with teeth and/or dentures?: Yes Does patient usually wear dentures?: Yes  CIWA:  CIWA-Ar Total: 5 COWS:  COWS Total Score: 5  Treatment Plan Summary: Daily contact with patient to assess and evaluate symptoms and progress in treatment Medication management  Plan: Supportive approach/coping skills/relapse prevention           Alcohol dependence: continue detox with Librium           Consider medications for cravings           Depression: will resume the Celexa/Risperdal  that he found beneficial last time he was here. He ran out of them and did not pursue  further  Medical Decision Making Problem Points:  Review of psycho-social stressors (1) Data Points:  Review of medication regiment & side effects (2) Review of new medications or change in dosage (2)  I certify that inpatient services furnished can reasonably be expected to improve the patient's condition.   Aryaan Persichetti A 09/19/2014, 4:31 PM

## 2014-09-19 NOTE — BHH Group Notes (Signed)
[facility] LCSW Aftercare Discharge Planning Group Note   09/19/2014 10:10 AM    Participation Quality:  Appropraite  Mood/Affect:  Appropriate  Depression Rating:  7  Anxiety Rating:  7  Thoughts of Suicide:  No  Will you contract for safety?   NA  Current AVH:  No  Plan for Discharge/Comments:  Patient attended discharge planning group and actively participated in group. Patient is requesting residential treatment at either [facility] or [facility].  Suicide prevention education reviewed and SPE document provided.   Transportation Means: Patient has transportation.   Supports:  Patient has a limited support system.   Edit Ricciardelli, [name]

## 2014-09-19 NOTE — Progress Notes (Signed)
Pt did not attend wrap-up group   

## 2014-09-19 NOTE — BHH Group Notes (Signed)
[facility] LCSW Group Therapy  Emotional Regulation 1:15 - 2: 30 PM        09/19/2014  2:52 PM    Type of Therapy:  Group Therapy  Participation Level:  Minimal  Participation Quality:  Minimal  Affect:  Depressed, Flat  Cognitive:  Appropriate  Insight:  Developing/Improving  Engagement in Therapy:  Developing/Improving  Modes of Intervention:  Discussion Exploration Problem-Solving Supportive  Summary of Progress/Problems:  Group topic was emotional regulations.  Patient did not participate in discussion but remained in group throughout the session.  Concha Pyo 09/19/2014 2:52 PM

## 2014-09-19 NOTE — Plan of Care (Signed)
Problem: Diagnosis: Increased Risk For Suicide Attempt Goal: STG-Patient Will Report Suicidal Feelings to Staff Outcome: Progressing Pt denied any suicidal ideation on his self-inventory.  He denies any plan here and does contract verbally to come to staff before acting on any self-harm thoughts.

## 2014-09-19 NOTE — Progress Notes (Signed)
NUTRITION ASSESSMENT  Pt identified as at risk on the Malnutrition Screen Tool  INTERVENTION: 1. Educated patient on the importance of nutrition and encouraged intake of food and beverages. 2. Discussed weight goals. 3. Supplements: patient declines at this time 4. Provided "Sobriety Nutrition Therapy" from the Academy of Nutrition and Dietetics.  5. Encouraged high protein snacks daily  NUTRITION DIAGNOSIS: Unintentional weight loss related to sub-optimal intake as evidenced by pt report.   Goal: Pt to meet >/= 90% of their estimated nutrition needs.  Monitor:  PO intake  Assessment:  49 Y/O male with history of alcohol dependence who came requesting help. Upon assessment in the ED he admitted to [facility] with different plan and non specific HI.   Pt reports not eating much PTA. States that he might have a sandwich every other day. Pt was drinking instead of eating and did not have an appetite as a result.   Per weight history documentation, pt has lost 57 lb since 9/30 (28% weight loss x 4 months, significant for time frame).  Pt would benefit from nutritional supplementation given large amount of weight loss and alcohol abuse. Pt is declining supplements at this time. States they "taste like chalk".  Discussed with pt the importance of eating 3 meals a day with snacks, emphasizing protein consumption. Discussed the importance of good nutrition for mental health and aiding in recovery. Provided "Sobriety Nutrition Therapy" from the Academy of Nutrition and Dietetics.   Pt meets criteria for severe MALNUTRITION in the context of chronic illness as evidenced by 28% weight loss x 4 months and energy intake <75% for >/= 1 month.   Height: Ht Readings from Last 1 Encounters:  09/17/14 5' 11.5" (1.816 m)    Weight: Wt Readings from Last 1 Encounters:  09/17/14 148 lb (67.132 kg)    Weight Hx: Wt Readings from Last 10 Encounters:  09/17/14 148 lb (67.132 kg)  06/06/14 205 lb  (92.987 kg)  02/07/14 204 lb 1.6 oz (92.579 kg)  07/14/13 196 lb 1.6 oz (88.95 kg)  06/12/13 191 lb 9.3 oz (86.9 kg)  04/21/13 194 lb 1.6 oz (88.043 kg)  04/21/13 194 lb 1.6 oz (88.043 kg)  04/03/13 192 lb 10.9 oz (87.4 kg)  03/22/13 190 lb (86.183 kg)  01/19/13 198 lb 11.2 oz (90.13 kg)    BMI:  Body mass index is 20.36 kg/(m^2). Pt meets criteria for normal range based on current BMI.  Estimated Nutritional Needs: Kcal: 30-35 kcal/kg Protein: > 1 gram protein/kg Fluid: 1 ml/kcal  Diet Order: Diet regular Pt is also offered choice of unit snacks mid-morning and mid-afternoon.  Pt is eating as desired.   Lab results and medications reviewed.   Clayton Bibles, MS, RD, LDN Pager: [phone] After Hours Pager: [phone]

## 2014-09-20 MED ORDER — TRAMADOL HCL 50 MG PO TABS
50.0000 mg | ORAL_TABLET | Freq: Three times a day (TID) | ORAL | Status: DC | PRN
  Administered 2014-09-20 – 2014-09-24 (×10): 50 mg via ORAL
  Filled 2014-09-20 (×10): qty 1

## 2014-09-20 NOTE — Progress Notes (Signed)
[facility] MD Progress Note  09/20/2014 2:56 PM [name]  MRN:  [id number] Subjective:  [name] continues to be detox. Did sleep some better last night, but still feeling weak, tired. He admits to feeling depressed. As he detox he feels more depressed. States that he will not make it out there feeling this way. He is worried about his physical health, the possibility of having a stroke. He admits he is having problems with his memory, concentration. Diagnosis:   DSM5: Substance/Addictive Disorders:  Alcohol Related Disorder - Severe (303.90) Depressive Disorders:  Major Depressive Disorder - Severe (296.23) Total Time spent with patient: 30 minutes  Axis I: Generalized Anxiety Disorder and Substance Induced Mood Disorder  ADL's:  Intact  Sleep: better  Appetite:  Poor  Psychiatric Specialty Exam: Physical Exam  Review of Systems  Constitutional: Positive for malaise/fatigue.  HENT: Negative.   Eyes: Negative.   Respiratory: Negative.   Cardiovascular: Negative.   Gastrointestinal: Negative.   Genitourinary: Negative.   Musculoskeletal: Negative.   Skin: Negative.   Neurological: Positive for weakness.  Endo/Heme/Allergies: Negative.   Psychiatric/Behavioral: Positive for depression and substance abuse.    Blood pressure 107/76, pulse 102, temperature 97.6 F (36.4 C), temperature source Oral, resp. rate 18, height 5' 11.5" (1.816 m), weight 67.132 kg (148 lb).Body mass index is 20.36 kg/(m^2).  General Appearance: Disheveled  Eye Sport and exercise psychologist::  Fair  Speech:  Clear and Coherent, Slow and not spontaneous  Volume:  Decreased  Mood:  Depressed  Affect:  Depressed and Restricted  Thought Process:  Coherent and Goal Directed  Orientation:  Full (Time, Place, and Person)  Thought Content:  events symptoms worries concerns  Suicidal Thoughts:  No  Homicidal Thoughts:  No  Memory:  Immediate;   Fair Recent;   Fair Remote;   Fair  Judgement:  Fair  Insight:  Present  Psychomotor  Activity:  Restlessness  Concentration:  Poor  Recall:  Poor  Fund of Knowledge:Poor  Language: Fair  Akathisia:  No  Handed:  Right  AIMS (if indicated):     Assets:  Desire for Improvement  Sleep:  Number of Hours: 6.75   Musculoskeletal: Strength & Muscle Tone: within normal limits Gait & Station: normal Patient leans: N/A  Current Medications: Current Facility-Administered Medications  Medication Dose Route Frequency Provider Last Rate Last Dose  . alum & mag hydroxide-simeth (MAALOX/MYLANTA) 200-200-20 MG/5ML suspension 30 mL  30 mL Oral Q4H PRN Delfin Gant, NP      . chlordiazePOXIDE (LIBRIUM) capsule 25 mg  25 mg Oral Q6H PRN Laverle Hobby, PA-C      . chlordiazePOXIDE (LIBRIUM) capsule 25 mg  25 mg Oral BH-qamhs Laverle Hobby, PA-C       Followed by  . [START ON 09/22/2014] chlordiazePOXIDE (LIBRIUM) capsule 25 mg  25 mg Oral Daily Spencer E Simon, PA-C      . citalopram (CELEXA) tablet 10 mg  10 mg Oral Daily Nicholaus Bloom, MD   10 mg at 09/20/14 0931  . hydrOXYzine (ATARAX/VISTARIL) tablet 25 mg  25 mg Oral Q6H PRN Laverle Hobby, PA-C   25 mg at 09/20/14 0932  . loperamide (IMODIUM) capsule 2-4 mg  2-4 mg Oral PRN Laverle Hobby, PA-C      . magnesium hydroxide (MILK OF MAGNESIA) suspension 30 mL  30 mL Oral Daily PRN Delfin Gant, NP      . multivitamin with minerals tablet 1 tablet  1 tablet Oral Daily Frederico Hamman E  Simon, PA-C   1 tablet at 09/20/14 0932  . nicotine (NICODERM CQ - dosed in mg/24 hours) patch 21 mg  21 mg Transdermal Q0600 Nicholaus Bloom, MD   21 mg at 09/20/14 0933  . ondansetron (ZOFRAN-ODT) disintegrating tablet 4 mg  4 mg Oral Q8H PRN Delfin Gant, NP      . Rivaroxaban (XARELTO) tablet 15 mg  15 mg Oral BID WC Delfin Gant, NP   15 mg at 09/20/14 0931  . [START ON 10/09/2014] rivaroxaban (XARELTO) tablet 20 mg  20 mg Oral Q breakfast Delfin Gant, NP      . thiamine (VITAMIN B-1) tablet 100 mg  100 mg Oral Daily Laverle Hobby, PA-C   100 mg at 09/20/14 0931  . traZODone (DESYREL) tablet 100 mg  100 mg Oral QHS,MR X 1 Nicholaus Bloom, MD   100 mg at 09/19/14 2118    Lab Results: No results found for this or any previous visit (from the past 48 hour(s)).  Physical Findings: AIMS: Facial and Oral Movements Muscles of Facial Expression: None, normal Lips and Perioral Area: None, normal Jaw: None, normal Tongue: None, normal,Extremity Movements Upper (arms, wrists, hands, fingers): None, normal Lower (legs, knees, ankles, toes): None, normal, Trunk Movements Neck, shoulders, hips: None, normal, Overall Severity Severity of abnormal movements (highest score from questions above): None, normal Incapacitation due to abnormal movements: None, normal Patient's awareness of abnormal movements (rate only patient's report): No Awareness, Dental Status Current problems with teeth and/or dentures?: Yes Does patient usually wear dentures?: Yes  CIWA:  CIWA-Ar Total: 9 COWS:  COWS Total Score: 5  Treatment Plan Summary: Daily contact with patient to assess and evaluate symptoms and progress in treatment Medication management  Plan: Supportive approach/coping skills/relapse prevention           Alcohol Dependence: continue the Librium detox protocol                                                  Consider Campral           Depression: will increase the Celexa to 20 mg daily. Work with CBT mindfulness           Explore residential treatment options ( right now has an appointment at [facility]                         Tuesday AM for admission to their residential treatment program).           Given the high risk for relapse the dual diagnosis, the severity of the depression would be better            served by having a door to door as D/C plan Medical Decision Making Problem Points:  Established problem, worsening (2) and Review of psycho-social stressors (1) Data Points:  Review of medication regiment & side effects  (2) Review of new medications or change in dosage (2)  I certify that inpatient services furnished can reasonably be expected to improve the patient's condition.   Makai Dumond A 09/20/2014, 2:56 PM

## 2014-09-20 NOTE — Progress Notes (Signed)
Patient ID: [name], male   DOB: 1965/12/04, 49 y.o.   MRN: [id number] Adult Psychoeducational Group Note  Date:  09/20/2014 Time:  09:45  Group Topic/Focus:  Orientation:   The focus of this group is to educate the patient on the purpose and policies of crisis stabilization and provide a format to answer questions about their admission.  The group details unit policies and expectations of patients while admitted.  Participation Level:  Active  Participation Quality:  Appropriate  Affect: Flat  Cognitive:  Alert and Oriented  Insight: Improving  Engagement in Group:  Engaged  Modes of Intervention:  Discussion, Education, Orientation and Support  Additional Comments:  Pt able to identify one daily goal to accomplish today.   Elenore Rota 09/20/2014, 12:35 PM

## 2014-09-20 NOTE — Plan of Care (Signed)
Problem: Alteration in mood Goal: LTG-Patient reports reduction in suicidal thoughts (Patient reports reduction in suicidal thoughts and is able to verbalize a safety plan for whenever patient is feeling suicidal)  Outcome: Progressing Pt denies SI at this time     

## 2014-09-20 NOTE — Plan of Care (Signed)
Problem: Ineffective individual coping Goal: STG: Patient will remain free from self harm Outcome: Progressing Patient has not engaged in self harm and denies SI  Problem: Diagnosis: Increased Risk For Suicide Attempt Goal: STG-Patient Will Comply With Medication Regime Outcome: Progressing Patient has been med compliant.      

## 2014-09-20 NOTE — Progress Notes (Signed)
D: Pt denies SI/HI/AVH. Pt is pleasant and cooperative. Pt stated he is planning to go to [facility].   A: Pt was offered support and encouragement. Pt was given scheduled medications. Pt was encourage to attend groups. Q 15 minute checks were done for safety.   R: Pt is taking medication. Pt has no complaints at this time .Pt receptive to treatment and safety maintained on unit.

## 2014-09-20 NOTE — BHH Group Notes (Signed)
[place] LCSW Group Therapy  [facility] [place] 1:15 - 2:30 PM  09/20/2014   Type of Therapy:  Group Therapy  Participation Level: Minimal  Participation Quality:  Attentive  Affect:  Appropriate  Cognitive:  Appropriate  Insight:  Developing/Improving   Engagement in Therapy:  Developing/Improving   Modes of Intervention:  Discussion, Education, Exploration, Problem-Solving, Rapport Building, Support   Summary of Progress/Problems:   Patient was attentive to speaker from the [employer] as he shared his story of dealing with mental health/substance abuse issues and overcoming it by working a recovery program.  Patient made no comments on the presentation.  Concha Pyo 09/20/2014

## 2014-09-20 NOTE — Progress Notes (Addendum)
Patient has been up and visible on unit. Flat, sad in affect with depressed and anxious mood. Reports his depression is a 7/10, hopelessness is an 8/10 and anxiety is a 5/10. States his goal is to concentrate more and he plans to meet taht goal but paying better attention. Asking about medications, specifically celexa that was just started. Patient's CIWA at noon was a 9, BP stable however HR at 102. Patient given med education, support and reassurance. Fall risk precautions explained. Medicated per orders, vistaril given prn with good relief. Patient denies SI/HI/AVH and remains safe. Jamie Kato

## 2014-09-21 MED ORDER — TRAZODONE HCL 100 MG PO TABS
200.0000 mg | ORAL_TABLET | Freq: Every evening | ORAL | Status: DC | PRN
  Administered 2014-09-21 – 2014-09-23 (×3): 200 mg via ORAL
  Filled 2014-09-21 (×3): qty 2

## 2014-09-21 MED ORDER — HYDROXYZINE HCL 25 MG PO TABS
25.0000 mg | ORAL_TABLET | Freq: Four times a day (QID) | ORAL | Status: DC | PRN
  Administered 2014-09-21 – 2014-09-24 (×7): 25 mg via ORAL
  Filled 2014-09-21 (×3): qty 1
  Filled 2014-09-21: qty 10
  Filled 2014-09-21 (×2): qty 1
  Filled 2014-09-21: qty 10
  Filled 2014-09-21 (×2): qty 1

## 2014-09-21 MED ORDER — CITALOPRAM HYDROBROMIDE 20 MG PO TABS
20.0000 mg | ORAL_TABLET | Freq: Every day | ORAL | Status: DC
  Administered 2014-09-22: 20 mg via ORAL
  Filled 2014-09-21 (×3): qty 1

## 2014-09-21 NOTE — Progress Notes (Signed)
The patient attended last evening's Karaoke group but did not participate.

## 2014-09-21 NOTE — Progress Notes (Signed)
[facility] MD Progress Note  09/21/2014 3:18 PM [name]  MRN:  [id number] Subjective:  [name] endorses persistent anxiety. Asked to be placed back on the Vistaril. He has couple of  more doses left of Librium to complete the detox. He is still worried about his follow up, his ability to get his medications. Admits that he does not have a lot of patience and goes off easily. He went off while at [facility] as well as at his PCP's office. The police was called on him. He was not charged with any crime but now he cant to to [employer] and he cant go back to his PCP. He cant go back to the cancer center as he missed "too many appointments." Increasingly more hopeless helpless. States he has "no one out there" that would be supportive of him. He is going to [facility] on Tuesday but cant quit thinking, ruminating about what his going to happen afterwards.  Diagnosis:   DSM5: Substance/Addictive Disorders:  Alcohol Related Disorder - Severe (303.90) Depressive Disorders:  Major Depressive Disorder - Severe (296.23) Total Time spent with patient: 30 minutes  Axis I: Generalized Anxiety Disorder and Substance Induced Mood Disorder  ADL's:  Intact  Sleep: Fair  Appetite:  Poor  Psychiatric Specialty Exam: Physical Exam  Review of Systems  Constitutional: Positive for malaise/fatigue.  HENT: Negative.   Eyes: Negative.   Respiratory: Negative.   Cardiovascular: Negative.   Gastrointestinal: Negative.   Genitourinary: Negative.   Musculoskeletal: Negative.   Skin: Negative.   Neurological: Positive for weakness.  Endo/Heme/Allergies: Negative.   Psychiatric/Behavioral: Positive for depression and substance abuse. The patient is nervous/anxious.     Blood pressure 102/59, pulse 67, temperature 98.5 F (36.9 C), temperature source Oral, resp. rate 18, height 5' 11.5" (1.816 m), weight 67.132 kg (148 lb).Body mass index is 20.36 kg/(m^2).  General Appearance: Disheveled  Eye Sport and exercise psychologist::  Fair  Speech:   Clear and Coherent, Slow and not spontaneous  Volume:  Decreased  Mood:  Anxious and Depressed  Affect:  Restricted  Thought Process:  Coherent and Goal Directed  Orientation:  Full (Time, Place, and Person)  Thought Content:  symptoms events worries concerns  Suicidal Thoughts:  No  Homicidal Thoughts:  No  Memory:  Immediate;   Fair Recent;   Fair Remote;   Fair  Judgement:  Fair  Insight:  Present and Shallow  Psychomotor Activity:  Restlessness  Concentration:  Fair  Recall:  [employer] of Knowledge:Fair  Language: Fair  Akathisia:  No  Handed:  Right  AIMS (if indicated):     Assets:  Desire for Improvement  Sleep:  Number of Hours: 6.25   Musculoskeletal: Strength & Muscle Tone: within normal limits Gait & Station: normal Patient leans: N/A  Current Medications: Current Facility-Administered Medications  Medication Dose Route Frequency Provider Last Rate Last Dose  . alum & mag hydroxide-simeth (MAALOX/MYLANTA) 200-200-20 MG/5ML suspension 30 mL  30 mL Oral Q4H PRN Delfin Gant, NP      . Derrill Memo ON 09/22/2014] chlordiazePOXIDE (LIBRIUM) capsule 25 mg  25 mg Oral Daily Laverle Hobby, PA-C      . [START ON 09/22/2014] citalopram (CELEXA) tablet 20 mg  20 mg Oral Daily Nicholaus Bloom, MD      . hydrOXYzine (ATARAX/VISTARIL) tablet 25 mg  25 mg Oral Q6H PRN Nicholaus Bloom, MD   25 mg at 09/21/14 1445  . magnesium hydroxide (MILK OF MAGNESIA) suspension 30 mL  30 mL Oral  Daily PRN Delfin Gant, NP      . multivitamin with minerals tablet 1 tablet  1 tablet Oral Daily Laverle Hobby, PA-C   1 tablet at 09/21/14 [phone]  . nicotine (NICODERM CQ - dosed in mg/24 hours) patch 21 mg  21 mg Transdermal Q0600 Nicholaus Bloom, MD   21 mg at 09/21/14 0843  . ondansetron (ZOFRAN-ODT) disintegrating tablet 4 mg  4 mg Oral Q8H PRN Delfin Gant, NP      . Rivaroxaban (XARELTO) tablet 15 mg  15 mg Oral BID WC Delfin Gant, NP   15 mg at 09/21/14 0842  . [START ON  10/09/2014] rivaroxaban (XARELTO) tablet 20 mg  20 mg Oral Q breakfast Delfin Gant, NP      . thiamine (VITAMIN B-1) tablet 100 mg  100 mg Oral Daily Laverle Hobby, PA-C   100 mg at 09/21/14 0842  . traMADol (ULTRAM) tablet 50 mg  50 mg Oral TID PRN Malena Peer, NP   50 mg at 09/21/14 0847  . traZODone (DESYREL) tablet 200 mg  200 mg Oral QHS PRN Nicholaus Bloom, MD        Lab Results: No results found for this or any previous visit (from the past 48 hour(s)).  Physical Findings: AIMS: Facial and Oral Movements Muscles of Facial Expression: None, normal Lips and Perioral Area: None, normal Jaw: None, normal Tongue: None, normal,Extremity Movements Upper (arms, wrists, hands, fingers): None, normal Lower (legs, knees, ankles, toes): None, normal, Trunk Movements Neck, shoulders, hips: None, normal, Overall Severity Severity of abnormal movements (highest score from questions above): None, normal Incapacitation due to abnormal movements: None, normal Patient's awareness of abnormal movements (rate only patient's report): No Awareness, Dental Status Current problems with teeth and/or dentures?: Yes Does patient usually wear dentures?: Yes  CIWA:  CIWA-Ar Total: 3 COWS:  COWS Total Score: 5  Treatment Plan Summary: Daily contact with patient to assess and evaluate symptoms and progress in treatment Medication management  Plan: Supportive approach/coping skills/relapse prevention           Alcohol Dependence: will complete the detox protocol and continue to work the relapse prevention plan            Depression: will increase the Celexa to 20 mg daily            Anxiety: will resume the Vistaril            Will work with CBT/mindfulness: develop strategies like focusing in his breathing to help decrease the worry, the ruminating that leads to the anxiety            Facilitate admission to [facility] on Tuesday AM  Medical Decision Making Problem Points:  Review of  psycho-social stressors (1) Data Points:  Review of medication regiment & side effects (2) Review of new medications or change in dosage (2)  I certify that inpatient services furnished can reasonably be expected to improve the patient's condition.   Nikholas Geffre A 09/21/2014, 3:18 PM

## 2014-09-21 NOTE — Progress Notes (Signed)
[facility] Group Notes:  (Nursing/MHT/Case Management/Adjunct)  Date:  09/21/2014  Time:  11:40 PM  Type of Therapy:  Group Therapy  Participation Level:  Active  Participation Quality:  Appropriate  Affect:  Appropriate  Cognitive:  Appropriate  Insight:  Appropriate  Engagement in Group:  Engaged  Modes of Intervention:  Discussion and Education  Summary of Progress/Problems: Pt. Was engaged in [place] discussion.  Lanell Persons 09/21/2014, 11:40 PM

## 2014-09-21 NOTE — Tx Team (Signed)
Interdisciplinary Treatment Plan Update   Date Reviewed:  09/21/2014  Time Reviewed:  11:02 AM  Progress in Treatment:   Attending groups:  Yes, patient is attending groups. Participating in groups:Yes, patient participates in group. Taking medication as prescribed: Yes  Tolerating medication: Yes Family/Significant other contact made:  No, patient declined collateral contact Patient understands diagnosis: Yes  Discussing patient identified problems/goals with staff: Yes Medical problems stabilized or resolved: Yes Denies suicidal/homicidal ideation: Yes Patient has not harmed self or others: Yes  For review of initial/current patient goals, please see plan of care.  Estimated Length of Stay:  3-5 days  Reasons for Continued Hospitalization:   New Problems/Goals identified:    Discharge Plan or Barriers:   Home with outpatient follow up with [facility]  Additional Comments:  Continue medication stabilization   Patient and CSW reviewed patient's identified goals and treatment plan.  Patient verbalized understanding and agreed to treatment plan.   Attendees:  Patient:  09/21/2014 11:02 AM   Signature:  Gabriel Earing, MD 09/21/2014 11:02 AM  Signature: Carlton Adam, MD 09/21/2014 11:02 AM  Signature:  Marcella Dubs, RN 09/21/2014 11:02 AM  Signature: Ivan Croft, RN 09/21/2014 11:02 AM  Signature:   09/21/2014 11:02 AM  Signature:  Joette Catching, LCSW 09/21/2014 11:02 AM  Signature:   09/21/2014 11:02 AM  Signature:  Lucinda Dell, Care Coordinator [facility] 09/21/2014 11:02 AM  Signature:   09/21/2014 11:02 AM  Signature: Edwyna Shell, LCSW Lead Social Worker 09/21/2014  11:02 AM  Signature:   Lars Pinks, RN [facility] 09/21/2014  11:02 AM  Signature:   09/21/2014  11:02 AM    Scribe for Treatment Team:   Joette Catching,  09/21/2014 11:02 AM

## 2014-09-21 NOTE — [facility] Group Notes (Signed)
[facility] LCSW Group Therapy  Feelings Around Relapse 1:15 -2:30        09/21/2014   Type of Therapy:  Group Therapy  Participation Level:  Minimal  Participation Quality:  Minimal  Affect:  Depressed  Cognitive:  Attentive Appropriate  Insight:  Developing/Improving  Engagement in Therapy: Developing/Improving  Modes of Intervention:  Discussion Exploration Problem-Solving Supportive  Summary of Progress/Problems:  The topic for today was feelings around relapse.   Patient did not engage in the discussion  Concha Pyo 09/21/2014

## 2014-09-21 NOTE — Progress Notes (Signed)
D [name] is quiet. He does not inititate conversation nor does he ask for anything. HE stays back. HE does not interact with his peers nor with the staff.   A At med pass this morning, he took all scheduled meds and was given prn tramadol for c/o leg discomfort.   R He completes his daily assessment and on it he writes he denies SI within the past 24 hrs and he rates his depression, hopelessness and anxiety "6/7/6", respectively. DC  Plan is DC to [facility] upcoming Tuesday.

## 2014-09-22 DIAGNOSIS — F1994 Other psychoactive substance use, unspecified with psychoactive substance-induced mood disorder: Secondary | ICD-10-CM

## 2014-09-22 MED ORDER — CITALOPRAM HYDROBROMIDE 40 MG PO TABS
40.0000 mg | ORAL_TABLET | Freq: Every day | ORAL | Status: DC
  Administered 2014-09-23 – 2014-09-24 (×2): 40 mg via ORAL
  Filled 2014-09-22 (×4): qty 1

## 2014-09-22 NOTE — Progress Notes (Signed)
Patient ID: [name], male   DOB: 1966-06-28, 49 y.o.   MRN: [id number] [facility] MD Progress Note  09/22/2014 2:23 PM Guillaume Weninger  MRN:  [id number]   Subjective:  Gunnar Fusi seen and chart reviewed for this evaluation. Patient appeared staying in his bed and continue to endorses persistent anxiety. Patient stated that he was admitted for detox treatment and receiving treatment as per protocol and has been tolerating without severe withdrawal symptoms. He is still worried about his follow up, his ability to get his medications. He cant go back to the cancer center as he missed "too many appointments." Increasingly more hopeless and helpless. He is going to [facility] on Tuesday but cant quit thinking, ruminating about what his going to happen afterwards.   Diagnosis:   DSM5: Substance/Addictive Disorders:  Alcohol Related Disorder - Severe (303.90) Depressive Disorders:  Major Depressive Disorder - Severe (296.23) Total Time spent with patient: 30 minutes  Axis I: Generalized Anxiety Disorder and Substance Induced Mood Disorder  ADL's:  Intact  Sleep: Fair  Appetite:  Poor  Psychiatric Specialty Exam: Physical Exam  ROS  Blood pressure 93/60, pulse 77, temperature 98 F (36.7 C), temperature source Oral, resp. rate 18, height 5' 11.5" (1.816 m), weight 94.348 kg (208 lb).Body mass index is 28.61 kg/(m^2).  General Appearance: Disheveled  Eye Sport and exercise psychologist::  Fair  Speech:  Clear and Coherent, Slow and not spontaneous  Volume:  Decreased  Mood:  Anxious and Depressed  Affect:  Restricted  Thought Process:  Coherent and Goal Directed  Orientation:  Full (Time, Place, and Person)  Thought Content:  symptoms events worries concerns  Suicidal Thoughts:  No  Homicidal Thoughts:  No  Memory:  Immediate;   Fair Recent;   Fair Remote;   Fair  Judgement:  Fair  Insight:  Present and Shallow  Psychomotor Activity:  Restlessness  Concentration:  Fair  Recall:  [employer] of Knowledge:Fair   Language: Fair  Akathisia:  No  Handed:  Right  AIMS (if indicated):     Assets:  Desire for Improvement  Sleep:  Number of Hours: 6.25   Musculoskeletal: Strength & Muscle Tone: within normal limits Gait & Station: normal Patient leans: N/A  Current Medications: Current Facility-Administered Medications  Medication Dose Route Frequency Provider Last Rate Last Dose  . alum & mag hydroxide-simeth (MAALOX/MYLANTA) 200-200-20 MG/5ML suspension 30 mL  30 mL Oral Q4H PRN Delfin Gant, NP      . citalopram (CELEXA) tablet 20 mg  20 mg Oral Daily Nicholaus Bloom, MD   20 mg at 09/22/14 2297  . hydrOXYzine (ATARAX/VISTARIL) tablet 25 mg  25 mg Oral Q6H PRN Nicholaus Bloom, MD   25 mg at 09/22/14 1209  . magnesium hydroxide (MILK OF MAGNESIA) suspension 30 mL  30 mL Oral Daily PRN Delfin Gant, NP      . multivitamin with minerals tablet 1 tablet  1 tablet Oral Daily Laverle Hobby, PA-C   1 tablet at 09/22/14 0902  . nicotine (NICODERM CQ - dosed in mg/24 hours) patch 21 mg  21 mg Transdermal Q0600 Nicholaus Bloom, MD   21 mg at 09/22/14 0904  . ondansetron (ZOFRAN-ODT) disintegrating tablet 4 mg  4 mg Oral Q8H PRN Delfin Gant, NP      . Rivaroxaban (XARELTO) tablet 15 mg  15 mg Oral BID WC Delfin Gant, NP   15 mg at 09/22/14 0902  . [START ON 10/09/2014] rivaroxaban (XARELTO) tablet  20 mg  20 mg Oral Q breakfast Delfin Gant, NP      . thiamine (VITAMIN B-1) tablet 100 mg  100 mg Oral Daily Laverle Hobby, PA-C   100 mg at 09/22/14 5916  . traMADol (ULTRAM) tablet 50 mg  50 mg Oral TID PRN Malena Peer, NP   50 mg at 09/21/14 2127  . traZODone (DESYREL) tablet 200 mg  200 mg Oral QHS PRN Nicholaus Bloom, MD   200 mg at 09/21/14 2127    Lab Results: No results found for this or any previous visit (from the past 48 hour(s)).  Physical Findings: AIMS: Facial and Oral Movements Muscles of Facial Expression: None, normal Lips and Perioral Area: None,  normal Jaw: None, normal Tongue: None, normal,Extremity Movements Upper (arms, wrists, hands, fingers): None, normal Lower (legs, knees, ankles, toes): None, normal, Trunk Movements Neck, shoulders, hips: None, normal, Overall Severity Severity of abnormal movements (highest score from questions above): None, normal Incapacitation due to abnormal movements: None, normal Patient's awareness of abnormal movements (rate only patient's report): No Awareness, Dental Status Current problems with teeth and/or dentures?: Yes Does patient usually wear dentures?: Yes  CIWA:  CIWA-Ar Total: 4 COWS:  COWS Total Score: 5  Treatment Plan Summary: Daily contact with patient to assess and evaluate symptoms and progress in treatment Medication management  Plan: Supportive approach/coping skills/relapse prevention           Alcohol Dependence: will complete the detox protocol and continue to work the relapse prevention plan            Depression: Increase Celexa to 40 mg daily            Anxiety: Continue the Vistaril            Will work with CBT/mindfulness: develop strategies like focusing in his breathing to help decrease the worry, the ruminating that leads to the anxiety            Facilitate admission to [facility] on Tuesday AM  Medical Decision Making Problem Points:  Review of psycho-social stressors (1) Data Points:  Review of medication regiment & side effects (2) Review of new medications or change in dosage (2)  I certify that inpatient services furnished can reasonably be expected to improve the patient's condition.   Adysson Revelle,JANARDHAHA R. 09/22/2014, 2:23 PM

## 2014-09-22 NOTE — [facility] Group Notes (Signed)
Group Therapy 11:00 am  Type of Therapy and Topic: Group Therapy: Avoiding Self-Sabotaging and Enabling Behaviors    Participation Level: Minimal    Mood: flat and depressed   Description of Group: Learn how to identify obstacles, self-sabotaging and enabling behaviors, what are they, why do we do them and what needs do these behaviors meet? Discuss unhealthy relationships and how to have positive healthy boundaries with those that sabotage and enable. Explore aspects of self-sabotage and enabling in yourself and how to limit these self-destructive behaviors in everyday life. A scaling question is used to help patient look at where they are now in their motivation to change, from 1 to 10 (lowest to highest motivation).   Therapeutic Goals:  1. Patient will identify one obstacle that relates to self-sabotage and enabling behaviors  2. Patient will identify one personal self-sabotaging or enabling behavior they did prior to admission  3. Patient able to establish a plan to change the above identified behavior they did prior to admission:   4. Patient will demonstrate ability to communicate their needs through discussion and/or role plays.    Therapeutic Modalities:   Cognitive Behavioral Therapy  Person-Centered Therapy  Motivational Interviewing   Patient actively listened during group but did not share on topic.  Tilden Fossa, MSW, [place] Worker [facility] [phone]

## 2014-09-22 NOTE — Progress Notes (Addendum)
D: Pt has anxious affect and mood.  Pt denies SI/HI, denies hallucinations.  Pt reports he has not had hallucinations in the past 5 days.  Pt attended evening group.  He is visible in the milieu and interacts appropriately with staff.   A: PRN medication administered for pain, insomnia, anxiety, see flowsheet.  Safety maintained.  Supported and encouraged pt.  Reviewed ways to prevent a fall since pt is high fall risk, reporting that he has fallen more than once "in the past few months."   R: Pt is compliant with medication administration.  He verbally contracts for safety.  Pt reports that he will notify writer of needs and concerns.  Will continue to monitor and assess for safety.

## 2014-09-22 NOTE — Plan of Care (Signed)
Problem: Ineffective individual coping Goal: STG: Patient will remain free from self harm Outcome: Progressing Pt has not harmed himself this shift.  He denied thoughts of self-harm and verbally contracted for safety.    Problem: Diagnosis: Increased Risk For Suicide Attempt Goal: STG-Patient Will Attend All Groups On The Unit Outcome: Progressing Pt attended evening group on 09/21/14.

## 2014-09-22 NOTE — Progress Notes (Signed)
Patient did attend the evening speaker AA meeting.  

## 2014-09-23 NOTE — Progress Notes (Signed)
D: Pt has depressed affect and mood.  Pt reports his goal was "trying to figure out a way to get my energy level back up.  I'd like to go to the gym or outside."  Pt denies SI/HI, denies hallucinations.  Pt attended evening group.  He is cooperative with staff and interacts appropriately with peers.   A: PRN medication administered for pain, sleep, anxiety, see flowsheet.  Safety maintained.  Met with pt 1:1 and provided support and encouragement.   R: Pt is compliant with medications.  He verbally contracts for safety and reports that he will notify staff of needs and concerns.  Will continue to monitor and assess for safety.

## 2014-09-23 NOTE — Progress Notes (Signed)
Patient ID: [name], male   DOB: [birth year], 49 y.o.   MRN: [id number] Patient ID: [name], male   DOB: 04/22/1966, 49 y.o.   MRN: [id number] [facility] MD Progress Note  09/23/2014 1:43 PM [name]  MRN:  [id number]   Subjective:  [name] stated that " I am all right" and stated that he believe that he has right medication and they are helping him and has no side effects. He appeared participating in group therapy.   Patient stated that he was admitted for detox treatment and receiving treatment as per protocol and has been tolerating without severe withdrawal symptoms. He cant go back to the cancer center as he missed "too many appointments.". He is going to [facility] on Tuesday but cant quit thinking, ruminating about what his going to happen afterwards.   Diagnosis:   DSM5: Substance/Addictive Disorders:  Alcohol Related Disorder - Severe (303.90) Depressive Disorders:  Major Depressive Disorder - Severe (296.23) Total Time spent with patient: 30 minutes  Axis I: Generalized Anxiety Disorder and Substance Induced Mood Disorder  ADL's:  Intact  Sleep: Fair  Appetite:  Poor  Psychiatric Specialty Exam: Physical Exam  ROS  Blood pressure 131/89, pulse 75, temperature 98 F (36.7 C), temperature source Oral, resp. rate 16, height 5' 11.5" (1.816 m), weight 94.348 kg (208 lb).Body mass index is 28.61 kg/(m^2).  General Appearance: Disheveled  Eye Sport and exercise psychologist::  Fair  Speech:  Clear and Coherent, Slow and not spontaneous  Volume:  Decreased  Mood:  Anxious and Depressed  Affect:  Restricted  Thought Process:  Coherent and Goal Directed  Orientation:  Full (Time, Place, and Person)  Thought Content:  symptoms events worries concerns  Suicidal Thoughts:  No  Homicidal Thoughts:  No  Memory:  Immediate;   Fair Recent;   Fair Remote;   Fair  Judgement:  Fair  Insight:  Present and Shallow  Psychomotor Activity:  Restlessness  Concentration:  Fair  Recall:  [employer] of  Knowledge:Fair  Language: Fair  Akathisia:  No  Handed:  Right  AIMS (if indicated):     Assets:  Desire for Improvement  Sleep:  Number of Hours: 5.5   Musculoskeletal: Strength & Muscle Tone: within normal limits Gait & Station: normal Patient leans: N/A  Current Medications: Current Facility-Administered Medications  Medication Dose Route Frequency Provider Last Rate Last Dose  . alum & mag hydroxide-simeth (MAALOX/MYLANTA) 200-200-20 MG/5ML suspension 30 mL  30 mL Oral Q4H PRN Delfin Gant, NP      . citalopram (CELEXA) tablet 40 mg  40 mg Oral Daily Durward Parcel, MD   40 mg at 09/23/14 0800  . hydrOXYzine (ATARAX/VISTARIL) tablet 25 mg  25 mg Oral Q6H PRN Nicholaus Bloom, MD   25 mg at 09/22/14 2153  . magnesium hydroxide (MILK OF MAGNESIA) suspension 30 mL  30 mL Oral Daily PRN Delfin Gant, NP      . multivitamin with minerals tablet 1 tablet  1 tablet Oral Daily Laverle Hobby, PA-C   1 tablet at 09/23/14 0800  . nicotine (NICODERM CQ - dosed in mg/24 hours) patch 21 mg  21 mg Transdermal Q0600 Nicholaus Bloom, MD   21 mg at 09/23/14 0800  . ondansetron (ZOFRAN-ODT) disintegrating tablet 4 mg  4 mg Oral Q8H PRN Delfin Gant, NP      . Rivaroxaban (XARELTO) tablet 15 mg  15 mg Oral BID WC Delfin Gant, NP   15  mg at 09/23/14 0800  . [START ON 10/09/2014] rivaroxaban (XARELTO) tablet 20 mg  20 mg Oral Q breakfast Delfin Gant, NP      . thiamine (VITAMIN B-1) tablet 100 mg  100 mg Oral Daily Laverle Hobby, PA-C   100 mg at 09/23/14 0800  . traMADol (ULTRAM) tablet 50 mg  50 mg Oral TID PRN Malena Peer, NP   50 mg at 09/23/14 0802  . traZODone (DESYREL) tablet 200 mg  200 mg Oral QHS PRN Nicholaus Bloom, MD   200 mg at 09/22/14 2153    Lab Results: No results found for this or any previous visit (from the past 48 hour(s)).  Physical Findings: AIMS: Facial and Oral Movements Muscles of Facial Expression: None, normal Lips and  Perioral Area: None, normal Jaw: None, normal Tongue: None, normal,Extremity Movements Upper (arms, wrists, hands, fingers): None, normal Lower (legs, knees, ankles, toes): None, normal, Trunk Movements Neck, shoulders, hips: None, normal, Overall Severity Severity of abnormal movements (highest score from questions above): None, normal Incapacitation due to abnormal movements: None, normal Patient's awareness of abnormal movements (rate only patient's report): No Awareness, Dental Status Current problems with teeth and/or dentures?: Yes Does patient usually wear dentures?: Yes  CIWA:  CIWA-Ar Total: 2 COWS:  COWS Total Score: 5  Treatment Plan Summary: Daily contact with patient to assess and evaluate symptoms and progress in treatment Medication management  Plan: Continue current treatment plan and medication management without changes during this visit. Supportive approach/coping skills/relapse prevention           Alcohol Dependence: will complete the detox protocol and continue to work the relapse prevention plan            Depression: Increase Celexa to 40 mg daily            Anxiety: Continue the Vistaril            Will work with CBT/mindfulness: develop strategies like focusing in his breathing to help decrease the worry, the ruminating that leads to the anxiety            Facilitate admission to [facility] on Tuesday AM  Medical Decision Making Problem Points:  Review of psycho-social stressors (1) Data Points:  Review of medication regiment & side effects (2) Review of new medications or change in dosage (2)  I certify that inpatient services furnished can reasonably be expected to improve the patient's condition.   [name],JANARDHAHA R. 09/23/2014, 1:43 PM

## 2014-09-23 NOTE — [facility] Group Notes (Signed)
[facility] Group Notes:  (Nursing/MHT/Case Management/Adjunct)  Date:  09/23/2014  Time:  2:46 PM  Type of Therapy:  Psychoeducational Skills  Participation Level:  Did Not Attend  Participation Quality:  Did Not Attend  Affect:  Did Not Attend  Cognitive:  Did Not Attend  Insight:  None  Engagement in Group:  Did Not Attend  Modes of Intervention:  Did Not Attend  Summary of Progress/Problems: Pt did not attend patient self inventory group.   Benancio Deeds Shanta 09/23/2014, 2:46 PM

## 2014-09-23 NOTE — [facility] Group Notes (Signed)
[place] LCSW Group Therapy  09/23/2014   1:15 PM   Type of Therapy:  Group Therapy  Participation Level:  Active  Participation Quality:  Appropriate and Attentive  Affect:  Appropriate  Cognitive:  Alert and Appropriate  Insight:  Developing/Improving and Engaged  Engagement in Therapy:  Developing/Improving and Engaged  Modes of Intervention:  Clarification, Confrontation, Discussion, Education, Exploration, Limit-setting, Orientation, Problem-solving, Rapport Building, Art therapist, Socialization and Support  Summary of Progress/Problems: The main focus of today's process group was to identify the patient's current support system and decide on other supports that can be put in place.  An emphasis was placed on using counselor, doctor, therapy groups, 12-step groups, and problem-specific support groups to expand supports, as well as doing something different than has been done before. Pt actively participated and was engaged in group discussion, as well as observed to connect with peers.  Discussed discharge plans and any social work needs as well.     Regan Lemming, LCSW 09/23/2014 2:32 PM

## 2014-09-23 NOTE — Plan of Care (Signed)
Problem: Diagnosis: Increased Risk For Suicide Attempt Goal: STG-Patient will be able to identify reason for suicidal STG-Patient will be able to identify reason for suicidal ideation  Outcome: Progressing Patient denying SI now as he has been told he will discharge to [facility] Tuesday morning. Patient has indicated that otherwise his hopelessness would remain.   Problem: Alteration in mood Goal: STG-Patient reports thoughts of self-harm to staff Outcome: Progressing Patient denying SI and has not engaged in any self harm.

## 2014-09-23 NOTE — Plan of Care (Signed)
Problem: Alteration in mood; excessive anxiety as evidenced by: Goal: STG-Pt will report an absence of self-harm thoughts/actions (Patient will report an absence of self-harm thoughts or actions)  Outcome: Progressing Pt denied having thoughts of harming himself this shift.

## 2014-09-23 NOTE — Progress Notes (Signed)
Patient's status remains fairly unchanged. He remains flat and depressed. Isolative to room/bed frequently. Is able to voice needs however. Continues to report chronic back pain rated as a 5/10 this morning and requesting ultram. On review of VS, patient is orthostatic. Manual recheck of BP - 110/82 sitting, 88/55 standing. Patient admits to feeling dizzy upon position changes. Patient is already identified as a high fall risk due to falls PTA and precautions are in place. Patient given a pitcher of gatorade and encouraged to push fluids. Reminded of ways to prevent falls. Patient also given support and encouragement. Medicated per orders and ultram prn given. On reassess, patient's pain is a 2/10. He is lying in bed where he frequently stays therefore encouraged to be more visible in the milieu and attend groups. Patient denies any other complaints, no SI/HI/AVH. Patient safe. Will continue to monitor closely. Jamie Kato

## 2014-09-24 DIAGNOSIS — F332 Major depressive disorder, recurrent severe without psychotic features: Secondary | ICD-10-CM

## 2014-09-24 MED ORDER — RIVAROXABAN 20 MG PO TABS: 20.0000 mg | ORAL_TABLET | Freq: Every day | ORAL | Status: DC

## 2014-09-24 MED ORDER — TRAZODONE HCL 100 MG PO TABS: 100.0000 mg | ORAL_TABLET | Freq: Every evening | ORAL | Status: DC | PRN

## 2014-09-24 MED ORDER — NICOTINE 21 MG/24HR TD PT24: 21.0000 mg | MEDICATED_PATCH | Freq: Every day | TRANSDERMAL | Status: DC

## 2014-09-24 MED ORDER — HYDROXYZINE HCL 25 MG PO TABS: 25.0000 mg | ORAL_TABLET | Freq: Four times a day (QID) | ORAL | Status: DC | PRN

## 2014-09-24 MED ORDER — TRAZODONE HCL 100 MG PO TABS
100.0000 mg | ORAL_TABLET | Freq: Every evening | ORAL | Status: DC | PRN
  Administered 2014-09-24: 100 mg via ORAL
  Filled 2014-09-24: qty 1

## 2014-09-24 MED ORDER — RIVAROXABAN 15 MG PO TABS: 15.0000 mg | ORAL_TABLET | Freq: Two times a day (BID) | ORAL | Status: DC

## 2014-09-24 MED ORDER — CITALOPRAM HYDROBROMIDE 40 MG PO TABS: 40.0000 mg | ORAL_TABLET | Freq: Every day | ORAL | Status: DC

## 2014-09-24 NOTE — Plan of Care (Signed)
Problem: Diagnosis: Increased Risk For Suicide Attempt Goal: LTG-Patient Will Report Improvement in Psychotic Symptoms LTG (by discharge) : Patient will report improvement in psychotic symptoms.  Outcome: Progressing Pt continues to deny having hallucinations.

## 2014-09-24 NOTE — Progress Notes (Signed)
D:Patient in the dayroom working on a puzzle.  Patient states he has been working on the puzzle to help with his concentration.  Patient states he is anxious and worried about discharge tomorrow.  Patient states he is worried that he will not make it to [facility] on time.  Patient states he did not have a goal for today.  Patient denies SI/HI and denies AVH. A: Staff to monitor Q 15 mins for safety.  Encouragement and support offered.  Scheduled medications administered per orders. R: Patient remains safe on the unit.  Patient attended group tonight.  Patient visible on the unit and interacting with peers.  Patient taking administered medications.

## 2014-09-24 NOTE — [facility] Suicide Risk Assessment (Signed)
Demographic Factors:  49 year old caucasian male   Total Time spent with patient: 30 minutes  Psychiatric Specialty Exam: Physical Exam  ROS- no headache, no visual changes, no chest pain, no shortness of breath, (+) dizziness in AM, no falls, no peripheral edema,  Denies melenas or any bleeding , no fever, no chills   Blood pressure 110/75, pulse 68, temperature 98.2 F (36.8 C), temperature source Oral, resp. rate 16, height 5' 11.5" (1.816 m), weight 208 lb (94.348 kg).Body mass index is 28.61 kg/(m^2).  General Appearance: Fairly Groomed  Engineer, water::  Good  Speech:  Normal Rate  Volume:  Normal  Mood:  improved compared to admission  Affect:  Appropriate  Thought Process:  Goal Directed and Linear  Orientation:  Other:  fully alert and attentive  Thought Content:  denies hallucinations, no delusions expressed, at this time does not appear internally preoccupied   Suicidal Thoughts:  No- at this time denies any thoughts of hurting self or anyone else   Homicidal Thoughts:  No  Memory: Recent and Remote grossly intact  Judgement:  Fair  Insight:  Present  Psychomotor Activity:  Decreased- no current tremors, no diaphoresis, no psychomotor agitation or restlessness   Concentration:  Good  Recall:  Good  Fund of Knowledge:Good  Language: Good  Akathisia:  Negative  Handed:  Right  AIMS (if indicated):     Assets:  Desire for Improvement Resilience  Sleep:  Number of Hours: 5.5    Musculoskeletal: Strength & Muscle Tone: within normal limits Gait & Station: normal- although feeling subjectively dizzy, has had no falls, and gait is steady. Patient leans: N/A   Mental Status Per Nursing Assessment::   On Admission:     Current Mental Status by Physician: At this time patient improved compared to admission- no tremors ,  No diaphoresis, does not appear to be in any active withdrawal. Mood is improved , denies hallucinations, no delusions expressed, no SI or HI. Future  oriented , and motivated in going to an inpatient rehab program after discharge   Loss Factors: Widowed, legal issues, father incarcerated   Historical Factors: History of depression and alcohol dependence  Risk Reduction Factors:   Sense of responsibility to family and Positive coping skills or problem solving skills  Continued Clinical Symptoms:  At this time patient improved , mood is improved, no suicidal ideations, no SI or HI at present, future oriented and planning on going to [facility] Of note, patient has felt dizzy- vitals are stable. Dizziness is in AM after waking up and patient attributes it to Trazodone, currently at 200 mgrs QHS.  Will taper Trazodone to 100 mgrs QHS PRN.   Cognitive Features That Contribute To Risk:  Alert, attentive, oriented x 3 .   Suicide Risk:  Mild:  Suicidal ideation of limited frequency, intensity, duration, and specificity.  There are no identifiable plans, no associated intent, mild dysphoria and related symptoms, good self-control (both objective and subjective assessment), few other risk factors, and identifiable protective factors, including available and accessible social support.  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 Coronary artery disease   . Degenerative joint disease of spine   . Pulmonary embolism 06/2010  . Degenerative joint disease   . 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 ETOH abuse   . DVT (deep venous thrombosis)   .  Hepatitis B   . Mental disorder   . Depression    AXIS IV:  problems related to social environment AXIS V:  60 upon discharge   Plan Of Care/Follow-up recommendations:  Activity:  As tolerated Diet:  Heart Healthy Tests:  NA Other:  See below  Is patient on multiple antipsychotic therapies at discharge:  No   Has Patient had three or more failed trials of  antipsychotic monotherapy by history:  No  Recommended Plan for Multiple Antipsychotic Therapies: NA  As discussed with staff patient will be going to [facility]hab) tomorrow in AM. Patient to follow up with PCP regarding management of anticoagulant treatment.   COBOS, FERNANDO 09/24/2014, 5:04 PM

## 2014-09-24 NOTE — Progress Notes (Signed)
D: Pt has depressed affect and mood.  Pt reports his day was "average."  Pt reports he felt dizzy in the morning when he woke up.  Vitals were obtained this evening.  Sitting: BP was 131/89 and pulse 69.  Standing: BP was 97/58 and pulse 93.  Pt denies SI/HI, denies hallucinations.  Pt attended evening group.  A: PRN medication administered for sleep, anxiety, pain, see flowsheet.  PO fluids encouraged and provided.  Safety maintained.  Met with pt 1:1 and provided support and encouragement.   R: Pt is compliant with medications.  He verbally contracts for safety.  Pt reports he has been drinking the Gatorade provided to him.  Will continue to monitor and assess for safety.
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
## 2014-09-24 NOTE — BHH Suicide Risk Assessment (Signed)
[facility] INPATIENT:  Family/Significant Other Suicide Prevention Education  Suicide Prevention Education:  Patient Discharged to [facility]:  Suicide Prevention Education Not Provided: {PT. DISCHARGED TO OTHER HEALTHCARE FACILITY:SUICIDE PREVENTION EDUCATION NOT PROVIDED ([facility]):  The patient is discharging to another healthcare facility for continuation of treatment.  The patient's medical information, including suicide ideations and risk factors, are a part of the medical information shared with the receiving healthcare facility.  Patient discharged to [facility] for residential treatment.  Concha Pyo 09/24/2014, 4:15 PM

## 2014-09-24 NOTE — Progress Notes (Addendum)
Patient's status remains fairly unchanged. Awaiting discharge in the early AM for [facility]. He remains flat, depressed and somewhat apathetic however is cooperative. Rates his depression as a 3/10, hopelessness as a 5/10 and anxiety as a 5/10.Continues to exhibit orthostatic hypotension and fluids provided and encouraged. Fall risk prevention explained and precautions in place. Patient supported, encouraged. Medicated per orders. Patient verbalizes understanding of teaching and indicates he is staying hydrated and urinating frequently. Confirms nutritional intake is adequate. Will continue to monitor closely. Denies SI/HI/AVH. Refusing to complete self inventory at this time, remains in bed. Jamie Kato

## 2014-09-24 NOTE — BHH Group Notes (Signed)
[facility] LCSW Aftercare Discharge Planning Group Note   09/24/2014 11:38 AM    Participation Quality:  Appropraite  Mood/Affect:  Appropriate  Depression Rating:  3  Anxiety Rating:  5  Thoughts of Suicide:  No  Will you contract for safety?   NA  Current AVH:  No  Plan for Discharge/Comments:  Patient attended discharge planning group and actively participated in group. He advised of having some dizziness but otherwise being ready to discharge to [facility] tomorrow.  Suicide prevention education reviewed and SPE document provided.   Transportation Means: Patient uses public transportation.   Supports:  Patient has a limited support system.   Mahika Vanvoorhis, [name]

## 2014-09-24 NOTE — Progress Notes (Signed)
Patient did attend the evening speaker AA meeting.  

## 2014-09-24 NOTE — Progress Notes (Signed)
Adult Psychoeducational Group Note  Date:  09/24/2014 Time:  10:00 AM  Group Topic/Focus:  Wellness Toolbox:   The focus of this group is to discuss various aspects of wellness, balancing those aspects and exploring ways to increase the ability to experience wellness.  Patients will create a wellness toolbox for use upon discharge.  Participation Level:  Active  Participation Quality:  Appropriate and Attentive  Affect:  Appropriate  Cognitive:  Alert and Appropriate  Insight: Good  Engagement in Group:  Engaged  Modes of Intervention:  Activity and Discussion  Additional Comments:  Pt. Attended group and actively participated in discussion.   [name] N 09/24/2014, 1:27 PM

## 2014-09-24 NOTE — BHH Group Notes (Signed)
[facility] LCSW Group Therapy          Overcoming Obstacles       1:15 -2:30        09/24/2014       Type of Therapy:  Group Therapy  Participation Level:  Minimal  Participation Quality: Minimal  Affect:  Appropriate  Cognitive:  Attentive Appropriate  Insight: Developing/Improving   Engagement in Therapy: Developing/Imprvoing   Modes of Intervention:  Discussion Exploration  Education Rapport BuildingProblem-Solving Support  Summary of Progress/Problems:  The main focus of today's group was overcoming obstacles.  Patient did not engage in the discussion.   Concha Pyo 09/24/2014

## 2014-09-24 NOTE — Plan of Care (Signed)
Problem: Diagnosis: Increased Risk For Suicide Attempt Goal: LTG-Patient Will Report Absence of Withdrawal Symptoms LTG (by discharge): Patient will report absence of withdrawal symptoms.  Outcome: Completed/Met Date Met:  09/24/14 Patient is denying all withdrawal and CIWAs have been discontinued. Protocol completed.  Problem: Alteration in mood Goal: STG-Pt Able to Identify Plan For Continuing Care at D/C Pt. Will be able to identify a plan for continuing care at discharge  Outcome: Progressing Patient verbalizes plan for discharge to [facility] early AM Tues 1/19.

## 2014-09-25 NOTE — Progress Notes (Signed)
[facility] Group Notes:  (Nursing/MHT/Case Management/Adjunct)  Date:  09/25/2014  Time:  9:20 PM  Type of Therapy:  AA Meeting  Participation Level:  Active  Participation Quality:  Attentive  Affect:  Appropriate  Cognitive:  Appropriate  Insight:  Appropriate  Engagement in Group:  Engaged  Modes of Intervention:  Discussion  Summary of Progress/Problems:  Clint Bolder 09/25/2014, 9:20 PM

## 2014-09-25 NOTE — Progress Notes (Signed)
[facility] Adult Case Management Discharge Plan :  Will you be returning to the same living situation after discharge: No.  Patient has an admission assessment with [facility]. At discharge, do you have transportation home?:Yes,  Patient assisted with city bus pass. Do you have the ability to pay for your medications:No. Patient needs assistance with indigent medications   Release of information consent forms completed and in the chart;  Patient's signature needed at discharge.  Patient to Follow up at: Follow-up Information    Follow up with [facility] On 09/25/2014.   Why:  Tuesday, January 19,, 2015 at [facility] for an admission assessment.  Please caught the bus to [place] and call [facility] at [phone] and asked them to pick you up at [facility] information:   [address] [place], [place]   [address]  [phone]      Follow up with [facility]    . Schedule an appointment as soon as possible for a visit in 1 day.   Why:  For follow up of DVT with current xarelto therapy   Contact information:   [address] [place] [address] [phone]      Patient denies SI/HI:   Per RN note, patient was not endorsing SI/HI or other thoughts of self harm at time of discharge.  Safety Planning and Suicide Prevention discussed: .Reviewed with all patients during discharge planning group  Patient refused referral  Concha Pyo 09/25/2014, 8:56 AM

## 2014-09-25 NOTE — Progress Notes (Addendum)
Patient d/c home at this time.  Patient discharge instructions explained and patient verbalized understanding.  Patient RX, and bus pass given.  Patient is to report to [facility] by 8:00 am.  Paitent denies SI/HI and denies AVH.  Patient d/c at [phone].

## 2014-09-28 NOTE — Progress Notes (Signed)
Patient Discharge Instructions:  After Visit Summary (AVS):   Faxed to:  09/28/14 Discharge Summary Note:   Faxed to:  09/28/14 Psychiatric Admission Assessment Note:   Faxed to:  09/28/14 Suicide Risk Assessment - Discharge Assessment:   Faxed to:  09/28/14 Faxed/Sent to the Next Level Care provider:  09/28/14 Next Level Care Provider Has Access to the EMR, 09/28/14  Faxed to [facility] @ [phone] Records provided to [facility] via CHL/Epic access.   Patsey Berthold, 09/28/2014, 1:10 PM

## 2014-12-25 ENCOUNTER — Emergency Department (HOSPITAL_COMMUNITY)
Admission: EM | Admit: 2014-12-25 | Discharge: 2014-12-26 | Disposition: A | Discharge status: Home / Self Care | Payer: Medicaid Other | Attending: Emergency Medicine | Admitting: Emergency Medicine

## 2014-12-25 ENCOUNTER — Encounter (HOSPITAL_COMMUNITY): Payer: Self-pay | Admitting: Emergency Medicine

## 2014-12-25 DIAGNOSIS — Z86711 Personal history of pulmonary embolism: Secondary | ICD-10-CM | POA: Diagnosis not present

## 2014-12-25 DIAGNOSIS — R45851 Suicidal ideations: Secondary | ICD-10-CM | POA: Diagnosis not present

## 2014-12-25 DIAGNOSIS — Z86718 Personal history of other venous thrombosis and embolism: Secondary | ICD-10-CM | POA: Insufficient documentation

## 2014-12-25 DIAGNOSIS — Z79899 Other long term (current) drug therapy: Secondary | ICD-10-CM | POA: Diagnosis not present

## 2014-12-25 DIAGNOSIS — Z8739 Personal history of other diseases of the musculoskeletal system and connective tissue: Secondary | ICD-10-CM | POA: Diagnosis not present

## 2014-12-25 DIAGNOSIS — Z8619 Personal history of other infectious and parasitic diseases: Secondary | ICD-10-CM | POA: Diagnosis not present

## 2014-12-25 DIAGNOSIS — F1014 Alcohol abuse with alcohol-induced mood disorder: Secondary | ICD-10-CM | POA: Diagnosis not present

## 2014-12-25 DIAGNOSIS — I2510 Atherosclerotic heart disease of native coronary artery without angina pectoris: Secondary | ICD-10-CM | POA: Diagnosis not present

## 2014-12-25 DIAGNOSIS — Z720 Tobacco use: Secondary | ICD-10-CM | POA: Diagnosis not present

## 2014-12-25 DIAGNOSIS — Z862 Personal history of diseases of the blood and blood-forming organs and certain disorders involving the immune mechanism: Secondary | ICD-10-CM | POA: Diagnosis not present

## 2014-12-25 DIAGNOSIS — R45850 Homicidal ideations: Secondary | ICD-10-CM | POA: Diagnosis not present

## 2014-12-25 MED ORDER — ALUM & MAG HYDROXIDE-SIMETH 200-200-20 MG/5ML PO SUSP: 30.0000 mL | ORAL | Status: DC | PRN

## 2014-12-25 MED ORDER — THIAMINE HCL 100 MG/ML IJ SOLN: 100.0000 mg | Freq: Every day | INTRAMUSCULAR | Status: DC

## 2014-12-25 MED ORDER — LORAZEPAM 1 MG PO TABS: 0.0000 mg | ORAL_TABLET | Freq: Two times a day (BID) | ORAL | Status: DC

## 2014-12-25 MED ORDER — ONDANSETRON HCL 4 MG PO TABS
4.0000 mg | ORAL_TABLET | Freq: Three times a day (TID) | ORAL | Status: DC | PRN
  Administered 2014-12-26: 4 mg via ORAL
  Filled 2014-12-25: qty 1

## 2014-12-25 MED ORDER — LORAZEPAM 1 MG PO TABS
0.0000 mg | ORAL_TABLET | Freq: Four times a day (QID) | ORAL | Status: DC
  Administered 2014-12-26: 1 mg via ORAL
  Filled 2014-12-25 (×2): qty 1

## 2014-12-25 MED ORDER — VITAMIN B-1 100 MG PO TABS
100.0000 mg | ORAL_TABLET | Freq: Every day | ORAL | Status: DC
  Administered 2014-12-26: 100 mg via ORAL
  Filled 2014-12-25: qty 1

## 2014-12-25 NOTE — ED Notes (Signed)
Pt comes via EMS where he was [place] up [place] gas station. Per EMS pt was cutting wrist with broken beer bottle glass and stated he wanted to kill himself and everyone else. Pt aggressive on scene and was given haldol 5mg  IM. Pt lethargic upon arrival. Pt has superficial cuts to wrist bilaterally and cut across right side of cheek.

## 2014-12-25 NOTE — ED Notes (Signed)
Bed: LP53 Expected date:  Expected time:  Means of arrival:  Comments: EMS 49 yo male intoxicated suicidal and homicidal/lacs to wrists

## 2014-12-26 ENCOUNTER — Inpatient Hospital Stay (HOSPITAL_COMMUNITY)
Admission: EM | Admit: 2014-12-26 | Discharge: 2014-12-31 | DRG: 897 | Disposition: A | Discharge status: Home / Self Care | Payer: Medicaid Other | Source: Intra-hospital | Attending: Psychiatry | Admitting: Psychiatry

## 2014-12-26 DIAGNOSIS — F1024 Alcohol dependence with alcohol-induced mood disorder: Secondary | ICD-10-CM | POA: Diagnosis not present

## 2014-12-26 DIAGNOSIS — F1014 Alcohol abuse with alcohol-induced mood disorder: Secondary | ICD-10-CM | POA: Diagnosis present

## 2014-12-26 DIAGNOSIS — Z915 Personal history of self-harm: Secondary | ICD-10-CM

## 2014-12-26 DIAGNOSIS — F17200 Nicotine dependence, unspecified, uncomplicated: Secondary | ICD-10-CM | POA: Diagnosis present

## 2014-12-26 DIAGNOSIS — R45851 Suicidal ideations: Secondary | ICD-10-CM

## 2014-12-26 DIAGNOSIS — Y908 Blood alcohol level of 240 mg/100 ml or more: Secondary | ICD-10-CM | POA: Diagnosis present

## 2014-12-26 DIAGNOSIS — Z86711 Personal history of pulmonary embolism: Secondary | ICD-10-CM | POA: Diagnosis not present

## 2014-12-26 DIAGNOSIS — Z590 Homelessness: Secondary | ICD-10-CM

## 2014-12-26 DIAGNOSIS — R45850 Homicidal ideations: Secondary | ICD-10-CM | POA: Diagnosis not present

## 2014-12-26 DIAGNOSIS — F1020 Alcohol dependence, uncomplicated: Secondary | ICD-10-CM | POA: Diagnosis present

## 2014-12-26 LAB — CBC
HEMATOCRIT: 39.3 % (ref 39.0–52.0)
HEMOGLOBIN: 13.2 g/dL (ref 13.0–17.0)
MCH: 33.7 pg (ref 26.0–34.0)
MCHC: 33.6 g/dL (ref 30.0–36.0)
MCV: 100.3 fL — ABNORMAL HIGH (ref 78.0–100.0)
Platelets: 126 10*3/uL — ABNORMAL LOW (ref 150–400)
RBC: 3.92 MIL/uL — ABNORMAL LOW (ref 4.22–5.81)
RDW: 15 % (ref 11.5–15.5)
WBC: 5.2 10*3/uL (ref 4.0–10.5)

## 2014-12-26 LAB — COMPREHENSIVE METABOLIC PANEL
ALBUMIN: 3.1 g/dL — AB (ref 3.5–5.2)
ALK PHOS: 89 U/L (ref 39–117)
ALT: 66 U/L — AB (ref 0–53)
AST: 91 U/L — ABNORMAL HIGH (ref 0–37)
Anion gap: 9 (ref 5–15)
BILIRUBIN TOTAL: 0.3 mg/dL (ref 0.3–1.2)
BUN: 10 mg/dL (ref 6–23)
CO2: 20 mmol/L (ref 19–32)
CREATININE: 0.82 mg/dL (ref 0.50–1.35)
Calcium: 7.7 mg/dL — ABNORMAL LOW (ref 8.4–10.5)
Chloride: 112 mmol/L (ref 96–112)
GFR calc Af Amer: >90 mL/min (ref >90)
GFR calc non Af Amer: >90 mL/min (ref >90)
Glucose, Bld: 104 mg/dL — ABNORMAL HIGH (ref 70–99)
POTASSIUM: 3.6 mmol/L (ref 3.5–5.1)
SODIUM: 141 mmol/L (ref 135–145)
Total Protein: 6.3 g/dL (ref 6.0–8.3)

## 2014-12-26 LAB — RAPID URINE DRUG SCREEN, HOSP PERFORMED
AMPHETAMINES: NOT DETECTED
Barbiturates: NOT DETECTED
Benzodiazepines: POSITIVE — AB
Cocaine: NOT DETECTED
Opiates: NOT DETECTED
Tetrahydrocannabinol: NOT DETECTED

## 2014-12-26 LAB — ACETAMINOPHEN LEVEL: Acetaminophen (Tylenol), Serum: <10 ug/mL — ABNORMAL LOW (ref 10–30)

## 2014-12-26 LAB — ETHANOL
ALCOHOL ETHYL (B): 400 mg/dL — AB (ref 0–9)
Alcohol, Ethyl (B): 296 mg/dL — ABNORMAL HIGH (ref 0–9)

## 2014-12-26 LAB — SALICYLATE LEVEL

## 2014-12-26 MED ORDER — HYDROXYZINE HCL 25 MG PO TABS: 25.0000 mg | ORAL_TABLET | Freq: Four times a day (QID) | ORAL | Status: DC | PRN

## 2014-12-26 MED ORDER — HYDROXYZINE HCL 25 MG PO TABS
25.0000 mg | ORAL_TABLET | Freq: Four times a day (QID) | ORAL | Status: DC | PRN
  Administered 2014-12-26: 25 mg via ORAL
  Filled 2014-12-26: qty 1

## 2014-12-26 MED ORDER — SODIUM CHLORIDE 0.9 % IV SOLN
INTRAVENOUS | Status: DC
  Administered 2014-12-26: 08:00:00 via INTRAVENOUS

## 2014-12-26 MED ORDER — SODIUM CHLORIDE 0.9 % IV BOLUS (SEPSIS)
1000.0000 mL | Freq: Once | INTRAVENOUS | Status: AC
  Administered 2014-12-26: 1000 mL via INTRAVENOUS

## 2014-12-26 MED ORDER — TRAZODONE HCL 100 MG PO TABS
100.0000 mg | ORAL_TABLET | Freq: Every evening | ORAL | Status: DC | PRN
  Administered 2014-12-26: 100 mg via ORAL
  Filled 2014-12-26: qty 1

## 2014-12-26 MED ORDER — CITALOPRAM HYDROBROMIDE 40 MG PO TABS: 40.0000 mg | ORAL_TABLET | Freq: Every day | ORAL | Status: DC

## 2014-12-26 MED ORDER — CITALOPRAM HYDROBROMIDE 20 MG PO TABS
20.0000 mg | ORAL_TABLET | Freq: Every day | ORAL | Status: DC
  Administered 2014-12-26: 20 mg via ORAL
  Filled 2014-12-26: qty 1

## 2014-12-26 MED ORDER — RIVAROXABAN 20 MG PO TABS
20.0000 mg | ORAL_TABLET | Freq: Every day | ORAL | Status: DC
  Administered 2014-12-26: 20 mg via ORAL
  Filled 2014-12-26: qty 1

## 2014-12-26 MED ORDER — RIVAROXABAN 20 MG PO TABS: 20.0000 mg | ORAL_TABLET | Freq: Every day | ORAL | Status: DC

## 2014-12-26 NOTE — ED Notes (Signed)
Bed: MI19 Expected date:  Expected time:  Means of arrival:  Comments: Hold for ED 4

## 2014-12-26 NOTE — ED Notes (Signed)
Pt AAO x 3, resting on stretcher, tremors noted.  No acute distress.  Remains passive SI, monitoring for safety, Q 15 min checks in effect.

## 2014-12-26 NOTE — ED Notes (Signed)
Report called to RN Maudie Mercury, [facility], [address] hall. Pending [facility] transport.

## 2014-12-26 NOTE — ED Notes (Signed)
MD aware of pt hypotension.

## 2014-12-26 NOTE — ED Notes (Signed)
Psych team at bedside .

## 2014-12-26 NOTE — ED Notes (Signed)
Resting quietly with eye closed. Easily arousable. Verbally responsive. Resp even and unlabored. ABC's intact. No behavior problems noted. Sitter at bedside.

## 2014-12-26 NOTE — BH Assessment (Signed)
Assessment Note  [name] is an 49 y.o. male with history of depression and alcohol.  The patient presents to [facility] intoxicated attempting to cut his wrist with a broken beer bottle in hand stating that he wanted to kill everyone. Patient's BAL upon arrival was 400. Patient sts that he is suicidal and unable to contract for safety. Patient has a plan to cut self or drink self to death. He has felt suicidal "all my life". Patient has a history of suicide attempts by cutting and overdosing. Trigger for depression includes homelessness. He [place] and sts that police have tried to destroy his property. Patient feels comfortable living in the woods stating, "It's my place of comfort/peace and people keep trying to destroy my property". Patient feels homicidal toward anyone that picks on him. Sts that people are mean to him for no reason. Patient has no plan to harm others or specified a victim. He denies AVH's. Patient denies drug use. Sts that he started drinking at age 75. He binge drinks daily (day to night) until passing out. Patient has withdrawal symptoms at this time: tremors, hot/cold flashes, and irritation. He has received treatment at at [facility] 09/17/14, 11/02/12, and  01/27/14. Patient has also received inpatient treatment at another facility but unable to recall details (name/date).    Axis I: Major Depressive Disorder, Recurrent, Severe without psychotic symptoms and Alcohol Dependence  Axis II: Deferred Axis III:  Past Medical History  Diagnosis Date  . Coronary artery disease   . Degenerative joint disease of spine   . Pulmonary embolism 06/2010  . Degenerative joint disease   . 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 ETOH abuse   . DVT (deep venous thrombosis)   . Hepatitis B   . Mental disorder   . Depression    Axis IV: other psychosocial or environmental problems, problems related to social environment, problems with access to health care services and problems  with primary support group Axis V: 31-40 impairment in reality testing  Past Medical History:  Past Medical History  Diagnosis Date  . Coronary artery disease   . Degenerative joint disease of spine   . Pulmonary embolism 06/2010  . Degenerative joint disease   . 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 ETOH abuse   . DVT (deep venous thrombosis)   . Hepatitis B   . Mental disorder   . Depression     Past Surgical History  Procedure Laterality Date  . Left elbow surgery    . Tonsillectomy      Family History:  Family History  Problem Relation Age of Onset  . Cancer Mother     Ovarian    Social History:  reports that he has been smoking Cigarettes.  He has a 60 pack-year smoking history. He has never used smokeless tobacco. He reports that he drinks about 7.2 oz of alcohol per week. He reports that he does not use illicit drugs.  Additional Social History:  Alcohol / Drug Use Pain Medications: SEE MAR Prescriptions: SEE MAR Over the Counter: SEE MAR History of alcohol / drug use?: No history of alcohol / drug abuse  CIWA: CIWA-Ar BP: 102/65 mmHg Pulse Rate: 87 Nausea and Vomiting: 2 Tactile Disturbances: none Tremor: not visible, but can be felt fingertip to fingertip Auditory Disturbances: not present Paroxysmal Sweats: no sweat visible Visual Disturbances: very mild sensitivity Anxiety: no anxiety, at ease Headache, Fullness in Head: none present Agitation: normal activity Orientation and  Clouding of Sensorium: oriented and can do serial additions CIWA-Ar Total: 4 COWS:    Allergies: No Known Allergies  Home Medications:  (Not in a hospital admission)  OB/GYN Status:  No LMP for male patient.  General Assessment Data Location of Assessment: [facility] ED Is this a Tele or Face-to-Face Assessment?: Face-to-Face Is this an Initial Assessment or a Re-assessment for this encounter?: Initial Assessment Living Arrangements: Other (Comment) ("I live in a  tent") Can pt return to current living arrangement?: Yes Admission Status: Voluntary Is patient capable of signing voluntary admission?: Yes Transfer from: [facility] Referral Source: Self/Family/Friend     [place] Living Arrangements: Other (Comment) ("I live in a tent") Name of Psychiatrist:  (no psychiatrist ) Name of Therapist:  (no therapist )  Education Status Is patient currently in school?: No  Risk to self with the past 6 months Suicidal Ideation: Yes-Currently Present Suicidal Intent: Yes-Currently Present Is patient at risk for suicide?: Yes Suicidal Plan?: Yes-Currently Present Specify Current Suicidal Plan:  (cut wrist with broken beer bottle and OD) Access to Means: Yes Specify Access to Suicidal Means:  (sharp objects and bottles) What has been your use of drugs/alcohol within the last 12 months?:  (alcohol ) Previous Attempts/Gestures: No How many times?:  (n/a) Other Self Harm Risks:  (n/a) Intentional Self Injurious Behavior: None Family Suicide History: No Recent stressful life event(s): Financial Problems, Job Loss, Loss (Comment) (homeless and no supports ) Persecutory voices/beliefs?: No Depression: Yes Depression Symptoms: Feeling angry/irritable, Feeling worthless/self pity, Loss of interest in usual pleasures, Guilt, Fatigue, Isolating, Tearfulness, Insomnia, Despondent Substance abuse history and/or treatment for substance abuse?: No Suicide prevention information given to non-admitted patients: Not applicable  Risk to Others within the past 6 months Homicidal Ideation: Yes-Currently Present Thoughts of Harm to Others: Yes-Currently Present Comment - Thoughts of Harm to Others:  ("I want to hurt people that mess with me") Current Homicidal Intent: Yes-Currently Present Current Homicidal Plan: No (No homicidal plan ) Access to Homicidal Means: Yes Describe Access to Homicidal Means:  (sharp objects) Identified Victim:  ("Anyone  that picks on me") History of harm to others?: No Assessment of Violence: None Noted Violent Behavior Description:  (patient is irritable but cooperative ) Does patient have access to weapons?: No Criminal Charges Pending?: No Does patient have a court date: No  Psychosis Hallucinations: None noted Delusions: None noted  Mental Status Report Appearance/Hygiene: Disheveled Eye Contact: Good Motor Activity: Freedom of movement Speech: Logical/coherent Level of Consciousness: Alert Mood: Depressed Affect: Appropriate to circumstance Anxiety Level: Minimal Thought Processes: Coherent Judgement: Unimpaired Orientation: Person, Place, Time, Situation Obsessive Compulsive Thoughts/Behaviors: None  Cognitive Functioning Concentration: Decreased Memory: Remote Intact, Recent Intact IQ: Average Insight: Poor Impulse Control: Poor Appetite: Poor Weight Loss:  (patient sts, "I don't know") Weight Gain:  (Pt sts, "I don't know") Sleep: No Change Total Hours of Sleep:  (n/a) Vegetative Symptoms: None  ADLScreening [facility] Assessment Services) Patient's cognitive ability adequate to safely complete daily activities?: Yes Patient able to express need for assistance with ADLs?: No Independently performs ADLs?: Yes (appropriate for developmental age)  Prior Inpatient Therapy Prior Inpatient Therapy: Yes     ADL Screening (condition at time of admission) Patient's cognitive ability adequate to safely complete daily activities?: Yes Is the patient deaf or have difficulty hearing?: No Does the patient have difficulty seeing, even when wearing glasses/contacts?: No Does the patient have difficulty concentrating, remembering, or making decisions?: Yes Patient able to express  need for assistance with ADLs?: No Does the patient have difficulty dressing or bathing?: No Independently performs ADLs?: Yes (appropriate for developmental age) Does the patient have difficulty walking or  climbing stairs?: No Weakness of Legs: None Weakness of Arms/Hands: None  Home Assistive Devices/Equipment Home Assistive Devices/Equipment: None    Abuse/Neglect Assessment (Assessment to be complete while patient is alone) Physical Abuse: Denies Verbal Abuse: Denies Sexual Abuse: Denies Exploitation of patient/patient's resources: Denies Self-Neglect: Denies Values / Beliefs Cultural Requests During Hospitalization: None Spiritual Requests During Hospitalization: None   Advance Directives (For Healthcare) Does patient have an advance directive?: No          Disposition:     On Site Evaluation by:   Reviewed with Physician:    Waldon Merl [facility] 12/26/2014 9:09 AM

## 2014-12-26 NOTE — ED Notes (Signed)
Acuity level is high.  Patient is a new admit to unit and was drinking prior to admission.  He has been hypotensive earlier in the day.

## 2014-12-26 NOTE — ED Notes (Signed)
Patient had urinated and was not aware we need a urine sample so it was empted.... I will collect it next time the patient urinates

## 2014-12-26 NOTE — ED Provider Notes (Signed)
Patient accepted to behavioral health, accepting is Dr. Sabra Heck.  Sherwood Gambler, MD 12/26/14 2249

## 2014-12-26 NOTE — ED Provider Notes (Signed)
CSN: [id number]     Arrival date & time 12/25/14  2306 History   First MD Initiated Contact with Patient 12/25/14 2313     Chief Complaint  Patient presents with  . Suicide Attempt  . Homicidal     (Consider location/radiation/quality/duration/timing/severity/associated sxs/prior Treatment) HPI The patient presents emergency department intoxicated attempting to cut his wrist with a broken beer bottle in stating that he wanted to kill everyone.  Patient was given 5 mg of Haldol IM by EMS.  The patient does not give me much history.  He states that he has not otherwise here Past Medical History  Diagnosis Date  . Coronary artery disease   . Degenerative joint disease of spine   . Pulmonary embolism 06/2010  . Degenerative joint disease   . 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 ETOH abuse   . DVT (deep venous thrombosis)   . Hepatitis B   . Mental disorder   . Depression    Past Surgical History  Procedure Laterality Date  . Left elbow surgery    . Tonsillectomy     Family History  Problem Relation Age of Onset  . Cancer Mother     Ovarian   History  Substance Use Topics  . Smoking status: Current Every Day Smoker -- 2.00 packs/day for 30 years    Types: Cigarettes  . Smokeless tobacco: Never Used  . Alcohol Use: 7.2 oz/week    12 Cans of beer per week     Comment: Drinks a 6-pack of beer daily.    Review of Systems Level V caveat applies due to intoxication   Allergies  Review of patient's allergies indicates no known allergies.  Home Medications   Prior to Admission medications   Medication Sig Start Date End Date Taking? Authorizing Provider  citalopram (CELEXA) 40 MG tablet Take 1 tablet (40 mg total) by mouth daily. For depression 09/24/14   Encarnacion Slates, NP  hydrOXYzine (ATARAX/VISTARIL) 25 MG tablet Take 1 tablet (25 mg total) by mouth every 6 (six) hours as needed for anxiety. 09/24/14   Encarnacion Slates, NP  nicotine (NICODERM CQ - DOSED IN MG/24  HOURS) 21 mg/24hr patch Place 1 patch (21 mg total) onto the skin daily at 6 (six) AM. For nicotine addiction 09/24/14   Encarnacion Slates, NP  Rivaroxaban (XARELTO) 15 MG TABS tablet Take 1 tablet (15 mg total) by mouth 2 (two) times daily with a meal. For prevention of blood clots 09/24/14   Niel Hummer, NP  rivaroxaban (XARELTO) 20 MG TABS tablet Take 1 tablet (20 mg total) by mouth daily with supper. (Start on 10-09-14 @ 8 am): For prevention of blood clot 10/09/14   Niel Hummer, NP  traZODone (DESYREL) 100 MG tablet Take 1 tablet (100 mg total) by mouth at bedtime as needed for sleep. 09/24/14   Encarnacion Slates, NP   BP 94/50 mmHg  Pulse 93  Temp(Src) 97.8 F (36.6 C) (Oral)  Resp 16  SpO2 98% Physical Exam  Constitutional: He is oriented to person, place, and time. He appears well-developed and well-nourished. No distress.  HENT:  Head: Normocephalic and atraumatic.  Mouth/Throat: Oropharynx is clear and moist.  Eyes: Pupils are equal, round, and reactive to light.  Neck: Normal range of motion. Neck supple.  Cardiovascular: Normal rate, regular rhythm and normal heart sounds.  Exam reveals no gallop and no friction rub.   No murmur heard. Pulmonary/Chest: Effort normal and breath sounds normal.  Neurological: He is alert and oriented to person, place, and time. He exhibits normal muscle tone. Coordination normal.  Skin: Skin is warm and dry. No rash noted. No erythema.  Nursing note and vitals reviewed.   ED Course  Procedures (including critical care time) Labs Review Labs Reviewed  ACETAMINOPHEN LEVEL - Abnormal; Notable for the following:    Acetaminophen (Tylenol), Serum <10.0 (*)    All other components within normal limits  CBC - Abnormal; Notable for the following:    RBC 3.92 (*)    MCV 100.3 (*)    Platelets 126 (*)    All other components within normal limits  COMPREHENSIVE METABOLIC PANEL - Abnormal; Notable for the following:    Glucose, Bld 104 (*)    Calcium 7.7  (*)    Albumin 3.1 (*)    AST 91 (*)    ALT 66 (*)    All other components within normal limits  ETHANOL - Abnormal; Notable for the following:    Alcohol, Ethyl (B) 400 (*)    All other components within normal limits  SALICYLATE LEVEL  URINE RAPID DRUG SCREEN (HOSP PERFORMED)   Patient will be given IV fluids and monitored.  His blood alcohol is 400.  Patient will need TTS evaluation     Dalia Heading, PA-C 12/26/14 0124  Noemi Chapel, MD 12/26/14 [phone]

## 2014-12-26 NOTE — ED Notes (Signed)
IV 20g removed from rt hand. Pt tolerated well.

## 2014-12-26 NOTE — ED Notes (Addendum)
Pt moved to bed 32. Resting quietly with eye closed. Easily arousable. Verbally responsive. Resp even and unlabored. ABC's intact. No behavior problems noted. NAD noted. Sitter at bedside. IV infusing NS at 168ml/hr without difficulty.

## 2014-12-26 NOTE — Consult Note (Signed)
[facility] Face-to-Face Psychiatry Consult   Reason for Consult: Alcohol induced mood disorder Referring Physician:  EDP Patient Identification: Ruben Mahler MRN:  [id number] Principal Diagnosis: Alcohol abuse with alcohol-induced mood disorder Diagnosis:   Patient Active Problem List   Diagnosis Date Noted  . GAD (generalized anxiety disorder) [F41.1] 09/18/2014  . Recurrent major depression-severe [F33.2] 09/18/2014  . Alcohol abuse with alcohol-induced mood disorder [F10.14] 09/17/2014  . Alcohol intoxication [F10.129]   . DVT, lower extremity [I82.409]   . Left leg DVT [I82.402] 06/06/2014  . Alcohol dependence [F10.20] 01/27/2014  . Upper leg DVT (deep venous thromboembolism), acute [I82.4Y9] 06/12/2013  . Homeless single person [Z59.0] 06/12/2013  . 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 Cellulitis of leg, left [L03.116] 01/03/2013  . Subtherapeutic international normalized ratio (INR) [R79.1] 12/18/2012  . Alcohol withdrawal [F10.239] 11/03/2012  . Major depressive disorder, recurrent episode, severe, without mention of psychotic behavior [F33.2] 11/03/2012  . Coumadin toxicity [T60.4X1A] 05/20/2012  . Hematuria [R31.9] 05/20/2012  . Deep vein thrombosis [I82.409] 04/13/2012  . DVT of lower limb, acute [I82.409] 03/28/2012  . 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 DVT of lower extremity, bilateral [I82.403] 01/28/2012  . Anemia [D64.9] 01/28/2012  . Degenerative joint disease [M19.90]   . Alcohol abuse [F10.10] 03/01/2011  . Tobacco abuse [Z72.0] 03/01/2011    Total Time spent with patient: 1 hour  Subjective:   Everton Bertha is a 49 y.o. male patient admitted with Alcohol induced mood disorder.  HPI:  Caucasian male, 49 years old was evaluated for Alcohol use disorder and Alcohol induced mood disorder.  Patient was hospitalized in our Chemical dependence  unit for Alcohol detox and  mood disorder January this year.  Patient, on arrival to the ER had Alcohol level of 400 last night and this am 296 this am.   He states that he has been drinking Alcohol since age 50 and cannot report periods of sobriety.  He is also unable to report how much Alcohol he consumes a day.  Patient reports that he drinks to treat his depressed mood and is homeless at this time.  [place] and had multiple medical illness.  Patient is suicidal and made multiple superficial cuts to his wrist.  He reports feeling depressed and rated his depression 10/10 with 10 being severe depressed.   He reports that he has not been taking his antidepressants because he has not refilled them.  He is homicidal towards people that "aggravates" him.   He has been accepted for admission and we will be seeking placement at any facility with available bed.  HPI Elements:   Location:  Alcohol use disorder, severe, Alcohol induced mood disorder. Quality:  severe. Severity:  severe. Timing:  acute. Duration:  Chronic Alcoholism. Context:  seeking Alcohol detox treatment..  Past Medical History:  Past Medical History  Diagnosis Date  . Coronary artery disease   . Degenerative joint disease of spine   . Pulmonary embolism 06/2010  . Degenerative joint disease   . 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 ETOH abuse   . DVT (deep venous thrombosis)   . Hepatitis B   . Mental disorder   . Depression     Past Surgical History  Procedure Laterality Date  . Left elbow surgery    . Tonsillectomy     Family History:  Family History  Problem Relation Age of Onset  . Cancer Mother  Ovarian   Social History:  History  Alcohol Use  . 7.2 oz/week  . 12 Cans of beer per week    Comment: Drinks a 6-pack of beer daily.     History  Drug Use No    History   Social History  . Marital Status: Legally Separated    Spouse Name: N/A  . Number of Children: N/A  . Years of Education: N/A    Occupational History  . Homeless   .     Social History Main Topics  . Smoking status: Current Every Day Smoker -- 2.00 packs/day for 30 years    Types: Cigarettes  . Smokeless tobacco: Never Used  . Alcohol Use: 7.2 oz/week    12 Cans of beer per week     Comment: Drinks a 6-pack of beer daily.  . Drug Use: No  . Sexual Activity: No   Other Topics Concern  . None   Social History Narrative   Estranged from family.  Homeless.     Additional Social History:    Pain Medications: SEE MAR Prescriptions: SEE MAR Over the Counter: SEE MAR History of alcohol / drug use?: No history of alcohol / drug abuse                     Allergies:  No Known Allergies  Labs:  Results for orders placed or performed during the hospital encounter of 12/25/14 (from the past 48 hour(s))  Acetaminophen level     Status: Abnormal   Collection Time: 12/25/14 11:56 PM  Result Value Ref Range   Acetaminophen (Tylenol), Serum <10.0 (L) 10 - 30 ug/mL    Comment:        THERAPEUTIC CONCENTRATIONS VARY SIGNIFICANTLY. A RANGE OF 10-30 ug/mL MAY BE AN EFFECTIVE CONCENTRATION FOR MANY PATIENTS. HOWEVER, SOME ARE BEST TREATED AT CONCENTRATIONS OUTSIDE THIS RANGE. ACETAMINOPHEN CONCENTRATIONS >150 ug/mL AT 4 HOURS AFTER INGESTION AND >50 ug/mL AT 12 HOURS AFTER INGESTION ARE OFTEN ASSOCIATED WITH TOXIC REACTIONS.   Ethanol (ETOH)     Status: Abnormal   Collection Time: 12/25/14 11:56 PM  Result Value Ref Range   Alcohol, Ethyl (B) 400 (H) 0 - 9 mg/dL    Comment:        LOWEST DETECTABLE LIMIT FOR SERUM ALCOHOL IS 11 mg/dL FOR MEDICAL PURPOSES ONLY   Salicylate level     Status: None   Collection Time: 12/25/14 11:56 PM  Result Value Ref Range   Salicylate Lvl <0.7 2.8 - 20.0 mg/dL  CBC     Status: Abnormal   Collection Time: 12/25/14 11:59 PM  Result Value Ref Range   WBC 5.2 4.0 - 10.5 K/uL   RBC 3.92 (L) 4.22 - 5.81 MIL/uL   Hemoglobin 13.2 13.0 - 17.0 g/dL   HCT 39.3  39.0 - 52.0 %   MCV 100.3 (H) 78.0 - 100.0 fL   MCH 33.7 26.0 - 34.0 pg   MCHC 33.6 30.0 - 36.0 g/dL   RDW 15.0 11.5 - 15.5 %   Platelets 126 (L) 150 - 400 K/uL  Comprehensive metabolic panel     Status: Abnormal   Collection Time: 12/25/14 11:59 PM  Result Value Ref Range   Sodium 141 135 - 145 mmol/L   Potassium 3.6 3.5 - 5.1 mmol/L   Chloride 112 96 - 112 mmol/L   CO2 20 19 - 32 mmol/L   Glucose, Bld 104 (H) 70 - 99 mg/dL   BUN 10 6 -  23 mg/dL   Creatinine, Ser 0.82 0.50 - 1.35 mg/dL   Calcium 7.7 (L) 8.4 - 10.5 mg/dL   Total Protein 6.3 6.0 - 8.3 g/dL   Albumin 3.1 (L) 3.5 - 5.2 g/dL   AST 91 (H) 0 - 37 U/L   ALT 66 (H) 0 - 53 U/L   Alkaline Phosphatase 89 39 - 117 U/L   Total Bilirubin 0.3 0.3 - 1.2 mg/dL   GFR calc non Af Amer >90 >90 mL/min   GFR calc Af Amer >90 >90 mL/min    Comment: (NOTE) The eGFR has been calculated using the CKD EPI equation. This calculation has not been validated in all clinical situations. eGFR's persistently <90 mL/min signify possible Chronic Kidney Disease.    Anion gap 9 5 - 15  Ethanol     Status: Abnormal   Collection Time: 12/26/14  6:47 AM  Result Value Ref Range   Alcohol, Ethyl (B) 296 (H) 0 - 9 mg/dL    Comment:        LOWEST DETECTABLE LIMIT FOR SERUM ALCOHOL IS 11 mg/dL FOR MEDICAL PURPOSES ONLY   Urine Drug Screen     Status: Abnormal   Collection Time: 12/26/14 12:00 PM  Result Value Ref Range   Opiates NONE DETECTED NONE DETECTED   Cocaine NONE DETECTED NONE DETECTED   Benzodiazepines POSITIVE (A) NONE DETECTED   Amphetamines NONE DETECTED NONE DETECTED   Tetrahydrocannabinol NONE DETECTED NONE DETECTED   Barbiturates NONE DETECTED NONE DETECTED    Comment:        DRUG SCREEN FOR MEDICAL PURPOSES ONLY.  IF CONFIRMATION IS NEEDED FOR ANY PURPOSE, NOTIFY LAB WITHIN 5 DAYS.        LOWEST DETECTABLE LIMITS FOR URINE DRUG SCREEN Drug Class       Cutoff (ng/mL) Amphetamine      1000 Barbiturate       200 Benzodiazepine   856 Tricyclics       314 Opiates          300 Cocaine          300 THC              50     Vitals: Blood pressure 106/64, pulse 91, temperature 97.8 F (36.6 C), temperature source Oral, resp. rate 18, SpO2 94 %.  Risk to Self: Suicidal Ideation: Yes-Currently Present Suicidal Intent: Yes-Currently Present Is patient at risk for suicide?: Yes Suicidal Plan?: Yes-Currently Present Specify Current Suicidal Plan:  (cut wrist with broken beer bottle and OD) Access to Means: Yes Specify Access to Suicidal Means:  (sharp objects and bottles) What has been your use of drugs/alcohol within the last 12 months?:  (alcohol ) How many times?:  (n/a) Other Self Harm Risks:  (n/a) Intentional Self Injurious Behavior: None Risk to Others: Homicidal Ideation: Yes-Currently Present Thoughts of Harm to Others: Yes-Currently Present Comment - Thoughts of Harm to Others:  ("I want to hurt people that mess with me") Current Homicidal Intent: Yes-Currently Present Current Homicidal Plan: No (No homicidal plan ) Access to Homicidal Means: Yes Describe Access to Homicidal Means:  (sharp objects) Identified Victim:  ("Anyone that picks on me") History of harm to others?: No Assessment of Violence: None Noted Violent Behavior Description:  (patient is irritable but cooperative ) Does patient have access to weapons?: No Criminal Charges Pending?: No Does patient have a court date: No Prior Inpatient Therapy: Prior Inpatient Therapy: Yes Prior Outpatient Therapy:    Current Facility-Administered Medications  Medication Dose Route Frequency Provider Last Rate Last Dose  . 0.9 %  sodium chloride infusion   Intravenous Continuous Kristen N Ward, DO   Stopped at 12/26/14 1205  . alum & mag hydroxide-simeth (MAALOX/MYLANTA) 200-200-20 MG/5ML suspension 30 mL  30 mL Oral PRN Dalia Heading, PA-C      . Derrill Memo ON 12/27/2014] citalopram (CELEXA) tablet 40 mg  40 mg Oral Daily Ripley Fraise, MD      . hydrOXYzine (ATARAX/VISTARIL) tablet 25 mg  25 mg Oral Q6H PRN Myley Bahner      . hydrOXYzine (ATARAX/VISTARIL) tablet 25 mg  25 mg Oral Q6H PRN Ripley Fraise, MD      . LORazepam (ATIVAN) tablet 0-4 mg  0-4 mg Oral 4 times per day Dalia Heading, PA-C   Stopped at 12/25/14 2357   Followed by  . [START ON 12/28/2014] LORazepam (ATIVAN) tablet 0-4 mg  0-4 mg Oral Q12H Christopher Lawyer, PA-C      . ondansetron [facility]) tablet 4 mg  4 mg Oral Q8H PRN Dalia Heading, PA-C   4 mg at 12/26/14 0742  . rivaroxaban (XARELTO) tablet 20 mg  20 mg Oral Q supper Ripley Fraise, MD      . thiamine (VITAMIN B-1) tablet 100 mg  100 mg Oral Daily Dalia Heading, PA-C   100 mg at 12/26/14 1026   Or  . thiamine (B-1) injection 100 mg  100 mg Intravenous Daily Dalia Heading, PA-C      . traZODone (DESYREL) tablet 100 mg  100 mg Oral QHS PRN Ripley Fraise, MD       Current Outpatient Prescriptions  Medication Sig Dispense Refill  . citalopram (CELEXA) 40 MG tablet Take 1 tablet (40 mg total) by mouth daily. For depression 30 tablet 0  . hydrOXYzine (ATARAX/VISTARIL) 25 MG tablet Take 1 tablet (25 mg total) by mouth every 6 (six) hours as needed for anxiety. 45 tablet 0  . rivaroxaban (XARELTO) 20 MG TABS tablet Take 1 tablet (20 mg total) by mouth daily with supper. (Start on 10-09-14 @ 8 am): For prevention of blood clot 30 tablet 0  . traZODone (DESYREL) 100 MG tablet Take 1 tablet (100 mg total) by mouth at bedtime as needed for sleep. 30 tablet 0  . nicotine (NICODERM CQ - DOSED IN MG/24 HOURS) 21 mg/24hr patch Place 1 patch (21 mg total) onto the skin daily at 6 (six) AM. For nicotine addiction (Patient not taking: Reported on 12/26/2014) 28 patch 0  . Rivaroxaban (XARELTO) 15 MG TABS tablet Take 1 tablet (15 mg total) by mouth 2 (two) times daily with a meal. For prevention of blood clots (Patient not taking: Reported on 12/26/2014) 29 tablet 0     Musculoskeletal: Strength & Muscle Tone: within normal limits Gait & Station: normal Patient leans: N/A  Psychiatric Specialty Exam:     Blood pressure 106/64, pulse 91, temperature 97.8 F (36.6 C), temperature source Oral, resp. rate 18, SpO2 94 %.There is no weight on file to calculate BMI.  General Appearance: Casual and Disheveled  Eye Contact::  Minimal  Speech:  Clear and Coherent and Slow  Volume:  Decreased  Mood:  Anxious and Depressed  Affect:  Congruent, Depressed and Flat  Thought Process:  Coherent, Goal Directed and Intact  Orientation:  Full (Time, Place, and Person)  Thought Content:  WDL  Suicidal Thoughts:  Yes.  without intent/plan  Homicidal Thoughts:  Yes.  without intent/plan  Memory:  Immediate;   Good  Recent;   Fair Remote;   Fair  Judgement:  Impaired  Insight:  Shallow  Psychomotor Activity:  Tremor  Concentration:  Fair  Recall:  NA  Fund of Knowledge:Fair  Language: Fair  Akathisia:  NA  Handed:  Right  AIMS (if indicated):     Assets:  Desire for Improvement  ADL's:  Impaired  Cognition: WNL  Sleep:      Medical Decision Making: Review of Psycho-Social Stressors (1), Established Problem, Worsening (2), Review of Medication Regimen & Side Effects (2) and Review of New Medication or Change in Dosage (2)  Treatment Plan Summary: Daily contact with patient to assess and evaluate symptoms and progress in treatment, Medication management and Plan admit to inpatient Psychiatric unit  Plan:  Recommend psychiatric Inpatient admission when medically cleared. Disposition: see above  Delfin Gant   PMHNP-BC 12/26/2014 1:54 PM Patient seen face-to-face for psychiatric evaluation, chart reviewed and case discussed with the physician extender and developed treatment plan. Reviewed the information documented and agree with the treatment plan. Corena Pilgrim, MD

## 2014-12-26 NOTE — ED Notes (Signed)
Pt sleeps on right side as a result blood pressure readings are hypotensive. When pt is on his back blood pressure readings are normal. Pt also reports he is not SI/HI.

## 2014-12-26 NOTE — ED Notes (Signed)
Patient transferred over from [place].  He is cooperative and calm on approach.  States he is feeling very depressed and is having racing thoughts.  States he has been drinking heavily and lost his will to live.  He was oriented to the unit and was given a lunch tray.  States he wants inpatient admission.

## 2014-12-26 NOTE — ED Provider Notes (Signed)
6:30 PM  Pt will have hypotension when he is lying on his side. Whenever he sits upright his blood pressure immediately improves. He has received 3 L of IV fluids in the emergency department. At this time I do not feel he needs any medical admission. He is still awaiting TTS evaluation for reports of earlier suicidal ideation with multiple superficial abrasions from self injury.  [place], DO 12/26/14 [phone]

## 2014-12-26 NOTE — ED Notes (Signed)
Pt OOB taking shower without assistance.

## 2014-12-26 NOTE — ED Provider Notes (Signed)
Meds updated per pharmacy Pt on [place] and this was ordered   Ripley Fraise, MD 12/26/14 1245

## 2014-12-27 ENCOUNTER — Encounter (HOSPITAL_COMMUNITY): Payer: Self-pay | Admitting: *Deleted

## 2014-12-27 DIAGNOSIS — R45850 Homicidal ideations: Secondary | ICD-10-CM

## 2014-12-27 DIAGNOSIS — F1024 Alcohol dependence with alcohol-induced mood disorder: Secondary | ICD-10-CM | POA: Diagnosis present

## 2014-12-27 DIAGNOSIS — R45851 Suicidal ideations: Secondary | ICD-10-CM

## 2014-12-27 DIAGNOSIS — F1014 Alcohol abuse with alcohol-induced mood disorder: Secondary | ICD-10-CM

## 2014-12-27 MED ORDER — THIAMINE HCL 100 MG/ML IJ SOLN: 100.0000 mg | Freq: Once | INTRAMUSCULAR | Status: DC

## 2014-12-27 MED ORDER — HYDROXYZINE HCL 25 MG PO TABS
25.0000 mg | ORAL_TABLET | Freq: Four times a day (QID) | ORAL | Status: DC | PRN
  Administered 2014-12-28 – 2014-12-31 (×7): 25 mg via ORAL
  Filled 2014-12-27 (×7): qty 1
  Filled 2014-12-27: qty 20

## 2014-12-27 MED ORDER — VITAMIN B-1 100 MG PO TABS
100.0000 mg | ORAL_TABLET | Freq: Every day | ORAL | Status: DC
  Administered 2014-12-28 – 2014-12-31 (×4): 100 mg via ORAL
  Filled 2014-12-27 (×6): qty 1

## 2014-12-27 MED ORDER — NICOTINE 21 MG/24HR TD PT24
21.0000 mg | MEDICATED_PATCH | Freq: Every day | TRANSDERMAL | Status: DC
  Administered 2014-12-27 – 2014-12-31 (×5): 21 mg via TRANSDERMAL
  Filled 2014-12-27 (×3): qty 1
  Filled 2014-12-27: qty 14
  Filled 2014-12-27 (×3): qty 1

## 2014-12-27 MED ORDER — TRAZODONE HCL 100 MG PO TABS
100.0000 mg | ORAL_TABLET | Freq: Every evening | ORAL | Status: DC | PRN
  Administered 2014-12-27 – 2014-12-29 (×3): 100 mg via ORAL
  Filled 2014-12-27 (×8): qty 1

## 2014-12-27 MED ORDER — LOPERAMIDE HCL 2 MG PO CAPS: 2.0000 mg | ORAL_CAPSULE | ORAL | Status: AC | PRN

## 2014-12-27 MED ORDER — ACETAMINOPHEN 325 MG PO TABS
650.0000 mg | ORAL_TABLET | Freq: Four times a day (QID) | ORAL | Status: DC | PRN
  Administered 2014-12-29 – 2014-12-31 (×2): 650 mg via ORAL
  Filled 2014-12-27 (×2): qty 2

## 2014-12-27 MED ORDER — LORAZEPAM 1 MG PO TABS
1.0000 mg | ORAL_TABLET | Freq: Every day | ORAL | Status: AC
  Administered 2014-12-30: 1 mg via ORAL
  Filled 2014-12-27: qty 1

## 2014-12-27 MED ORDER — LORAZEPAM 1 MG PO TABS
1.0000 mg | ORAL_TABLET | Freq: Four times a day (QID) | ORAL | Status: AC | PRN
  Administered 2014-12-29 (×2): 1 mg via ORAL
  Filled 2014-12-27 (×2): qty 1

## 2014-12-27 MED ORDER — LORAZEPAM 1 MG PO TABS
1.0000 mg | ORAL_TABLET | Freq: Two times a day (BID) | ORAL | Status: AC
  Administered 2014-12-29 (×2): 1 mg via ORAL
  Filled 2014-12-27 (×2): qty 1

## 2014-12-27 MED ORDER — LORAZEPAM 1 MG PO TABS
1.0000 mg | ORAL_TABLET | Freq: Three times a day (TID) | ORAL | Status: AC
  Administered 2014-12-28 (×3): 1 mg via ORAL
  Filled 2014-12-27 (×3): qty 1

## 2014-12-27 MED ORDER — LORAZEPAM 1 MG PO TABS
1.0000 mg | ORAL_TABLET | Freq: Four times a day (QID) | ORAL | Status: AC
  Administered 2014-12-27 (×4): 1 mg via ORAL
  Filled 2014-12-27 (×4): qty 1

## 2014-12-27 MED ORDER — ADULT MULTIVITAMIN W/MINERALS CH
1.0000 | ORAL_TABLET | Freq: Every day | ORAL | Status: DC
  Administered 2014-12-27 – 2014-12-31 (×5): 1 via ORAL
  Filled 2014-12-27 (×2): qty 1
  Filled 2014-12-27: qty 14
  Filled 2014-12-27 (×4): qty 1

## 2014-12-27 MED ORDER — ONDANSETRON 4 MG PO TBDP
4.0000 mg | ORAL_TABLET | Freq: Four times a day (QID) | ORAL | Status: AC | PRN
  Administered 2014-12-28: 4 mg via ORAL
  Filled 2014-12-27: qty 1

## 2014-12-27 MED ORDER — CITALOPRAM HYDROBROMIDE 20 MG PO TABS
20.0000 mg | ORAL_TABLET | Freq: Every day | ORAL | Status: DC
  Administered 2014-12-27 – 2014-12-31 (×5): 20 mg via ORAL
  Filled 2014-12-27 (×2): qty 1
  Filled 2014-12-27: qty 14
  Filled 2014-12-27 (×4): qty 1

## 2014-12-27 MED ORDER — ALUM & MAG HYDROXIDE-SIMETH 200-200-20 MG/5ML PO SUSP: 30.0000 mL | ORAL | Status: DC | PRN

## 2014-12-27 MED ORDER — RIVAROXABAN 15 MG PO TABS: 15.0000 mg | ORAL_TABLET | Freq: Two times a day (BID) | ORAL | Status: DC

## 2014-12-27 MED ORDER — MAGNESIUM HYDROXIDE 400 MG/5ML PO SUSP: 30.0000 mL | Freq: Every day | ORAL | Status: DC | PRN

## 2014-12-27 MED ORDER — RIVAROXABAN 20 MG PO TABS
20.0000 mg | ORAL_TABLET | Freq: Every day | ORAL | Status: DC
  Administered 2014-12-27 – 2014-12-31 (×5): 20 mg via ORAL
  Filled 2014-12-27 (×6): qty 1
  Filled 2014-12-27: qty 14

## 2014-12-27 NOTE — Tx Team (Signed)
Initial Interdisciplinary Treatment Plan   PATIENT STRESSORS: Financial difficulties Medication change or noncompliance Substance abuse   PATIENT STRENGTHS: Average or above average intelligence Communication skills General fund of knowledge Physical Health   PROBLEM LIST: Problem List/Patient Goals Date to be addressed Date deferred Reason deferred Estimated date of resolution  "Get back on my medications" 12/27/2014      "Mainly just be a detox" 12/27/2014           Depression 12/27/2014     Substance abuse 12/27/2014     Increased risk for suicide 12/27/2014                        DISCHARGE CRITERIA:  Ability to meet basic life and health needs Adequate post-discharge living arrangements Improved stabilization in mood, thinking, and/or behavior Medical problems require only outpatient monitoring Motivation to continue treatment in a less acute level of care Need for constant or close observation no longer present Reduction of life-threatening or endangering symptoms to within safe limits Safe-care adequate arrangements made Verbal commitment to aftercare and medication compliance Withdrawal symptoms are absent or subacute and managed without 24-hour nursing intervention  PRELIMINARY DISCHARGE PLAN: Attend 12-step recovery group Placement in alternative living arrangements  PATIENT/FAMIILY INVOLVEMENT: This treatment plan has been presented to and reviewed with the patient, [name], and/or family member.  The patient and family have been given the opportunity to ask questions and make suggestions.  [name] 12/27/2014, 2:10 AM
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
## 2014-12-27 NOTE — Progress Notes (Signed)
D: Pt has depressed affect and mood.  Pt reports he did not have a goal for the day and states "I just got here about 3 o'clock this morning."  Pt denies SI/HI, denies hallucinations, denies pain.  Pt has stayed in his room for the majority of the shift.  He reported withdrawal symptoms of "shakes, sweating and then feeling cold."  Pt did not attend evening group.   A: Introduced self to pt.  Met with pt 1:1 and provided support and encouragement.  Medications administered per order.  PO fluids encouraged and provided. R: Pt is compliant with medications.  Pt verbally contracts for safety.  Will continue to monitor and assess.   

## 2014-12-27 NOTE — [facility] Group Notes (Signed)
[place] LCSW Group Therapy 12/27/2014  1:15 PM   Type of Therapy: Group Therapy  Participation Level: Did Not Attend. Patient invited to participate but declined.   Tilden Fossa, MSW, [place] Worker [facility] [phone]

## 2014-12-27 NOTE — BHH Suicide Risk Assessment (Signed)
[facility] Admission Suicide Risk Assessment   Nursing information obtained from:  Patient Demographic factors:  Male, Caucasian, Low socioeconomic status, Living alone, Unemployed, Divorced or widowed Current Mental Status:  NA Loss Factors:  Financial problems / change in socioeconomic status Historical Factors:  Prior suicide attempts, Family history of mental illness or substance abuse Risk Reduction Factors:  NA Total Time spent with patient: 45 minutes Principal Problem: Alcohol abuse with alcohol-induced mood disorder Diagnosis:   Patient Active Problem List   Diagnosis Date Noted  . Suicidal ideation [R45.851]   . GAD (generalized anxiety disorder) [F41.1] 09/18/2014  . Recurrent major depression-severe [F33.2] 09/18/2014  . Alcohol abuse with alcohol-induced mood disorder [F10.14] 09/17/2014  . Alcohol intoxication [F10.129]   . DVT, lower extremity [I82.409]   . Left leg DVT [I82.402] 06/06/2014  . Alcohol dependence [F10.20] 01/27/2014  . Upper leg DVT (deep venous thromboembolism), acute [I82.4Y9] 06/12/2013  . Homeless single person [Z59.0] 06/12/2013  . 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 Cellulitis of leg, left [L03.116] 01/03/2013  . Subtherapeutic international normalized ratio (INR) [R79.1] 12/18/2012  . Alcohol withdrawal [F10.239] 11/03/2012  . Major depressive disorder, recurrent episode, severe, without mention of psychotic behavior [F33.2] 11/03/2012  . Coumadin toxicity [T60.4X1A] 05/20/2012  . Hematuria [R31.9] 05/20/2012  . Deep vein thrombosis [I82.409] 04/13/2012  . DVT of lower limb, acute [I82.409] 03/28/2012  . 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 DVT of lower extremity, bilateral [I82.403] 01/28/2012  . Anemia [D64.9] 01/28/2012  . Degenerative joint disease [M19.90]   . Alcohol abuse [F10.10] 03/01/2011  . Tobacco abuse [Z72.0] 03/01/2011      Continued Clinical Symptoms:  Alcohol Use Disorder Identification Test Final Score (AUDIT): 32 The "Alcohol Use Disorders Identification Test", Guidelines for Use in Primary Care, Second Edition.  World Pharmacologist [facility]). Score between 0-7:  no or low risk or alcohol related problems. Score between 8-15:  moderate risk of alcohol related problems. Score between 16-19:  high risk of alcohol related problems. Score 20 or above:  warrants further diagnostic evaluation for alcohol dependence and treatment.   CLINICAL FACTORS:  49 year old male, single, homeless, long history of alcohol dependence, presents to ED requesting detoxification and reporting depression. Has been drinking daily and heavily, up to a case of beer a day, sometimes 2 fifths of liquor daily. States he has had withdrawal seizures in the past but not recently. Reports a history of prior DTs. Presents depressed, anxious, but denies any suicidal ideations. No current hallucinations or agitation. (+) facial flushing, tremors. Dx- Alcohol Dependence, alcohol withdrawal, alcohol induced mood disorder, depressed . Plan- start Ativan detox protocol, thiamine, folate, Celexa 10 mgrs QDAY for depression ( of note, is on Xarelto for history of PE/DVT)   Musculoskeletal: Strength & Muscle Tone: within normal limits- some distal tremors  Gait & Station: normal Patient leans: N/A  Psychiatric Specialty Exam: Physical Exam  Review of Systems  Constitutional: Negative for fever and chills.  Eyes:       Uses glasses but does not have them with him   Respiratory: Negative for cough and shortness of breath.   Cardiovascular: Negative for chest pain.  Gastrointestinal: Positive for nausea. Negative for vomiting, abdominal pain, diarrhea and blood in stool.  Genitourinary: Negative for dysuria, urgency and frequency.  Skin: Negative for rash.  Neurological: Positive for tremors and seizures. Negative for headaches.   Endo/Heme/Allergies: Negative.  Denies any active bleeding- on Xarelto.  Psychiatric/Behavioral: Positive for depression and substance abuse.    Blood pressure 140/93, pulse 80, temperature 98.5 F (36.9 C), temperature source Oral, resp. rate 18, height 6\' 2"  (1.88 m), weight 204 lb (92.534 kg).Body mass index is 26.18 kg/(m^2).  General Appearance: Disheveled  Eye Contact::  Good  Speech:  Slow  Volume:  Decreased  Mood:  Depressed and Dysphoric  Affect:  Congruent- constricted, somewhat anxious  Thought Process:  Goal Directed and Linear  Orientation:  Other:  fully alert and attentive, no evidence of delirium at this time  Thought Content:  denies hallucinations, no delusions, not internally preoccupied   Suicidal Thoughts:  No at this time denies any thoughts of hurting self and  contracts for safety on unit   Homicidal Thoughts:  No  Memory:  Recent and remote fair  Judgement:  Fair  Insight:  Present  Psychomotor Activity:  some restlessness, tremors   Concentration:  Good  Recall:  Good  Fund of Knowledge:Good  Language: Negative  Akathisia:  Negative  Handed:  Left  AIMS (if indicated):     Assets:  Desire for Improvement Resilience  Sleep:     Cognition: fully alert and attentive   ADL's:  Impaired     COGNITIVE FEATURES THAT CONTRIBUTE TO RISK:  Closed-mindedness and Loss of executive function    SUICIDE RISK:   Moderate:  Frequent suicidal ideation with limited intensity, and duration, some specificity in terms of plans, no associated intent, good self-control, limited dysphoria/symptomatology, some risk factors present, and identifiable protective factors, including available and accessible social support.  PLAN OF CARE: Patient will be admitted to inpatient psychiatric unit for stabilization and safety. Will provide and encourage milieu participation. Provide medication management and maked adjustments as needed.  Will also provide medication management  to minimize risk of severe alcohol withdrawal.  Will follow daily.    Medical Decision Making:  Review of Psycho-Social Stressors (1), Review or order clinical lab tests (1), Established Problem, Worsening (2) and Review of New Medication or Change in Dosage (2)  I certify that inpatient services furnished can reasonably be expected to improve the patient's condition.   COBOS, Artesian 12/27/2014, 4:17 PM
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
## 2014-12-27 NOTE — Progress Notes (Signed)
Pt did not attend evening karaoke group. Pt was in bed sleeping.

## 2014-12-27 NOTE — BHH Group Notes (Signed)
Patient was invited to group however elected to stay in bed. Patient is not feeling well this morning. Jamie Kato

## 2014-12-27 NOTE — Progress Notes (Signed)
Found patient resting in bed. States, "I'm not feeling good at all." States he is experiencing withdrawal symptoms including sweats and shakiness. Patient flat, sad with depressed mood. Patient offered support, encouragement. Medicated per orders. He denies SI/HI and remains safe. [name]

## 2014-12-27 NOTE — BHH Suicide Risk Assessment (Signed)
[facility] INPATIENT:  Family/Significant Other Suicide Prevention Education  Suicide Prevention Education:  Patient Refusal for Family/Significant Other Suicide Prevention Education: The patient [name] has refused to provide written consent for family/significant other to be provided Family/Significant Other Suicide Prevention Education during admission and/or prior to discharge.  Physician notified. SPE reviewed with patient and brochure provided. Patient encouraged to return to hospital if having suicidal thoughts, patient verbalized his/her understanding and has no further questions at this time.   [name], Casimiro Needle 12/27/2014, 12:30 PM

## 2014-12-28 MED ORDER — ACAMPROSATE CALCIUM 333 MG PO TBEC
666.0000 mg | DELAYED_RELEASE_TABLET | Freq: Three times a day (TID) | ORAL | Status: DC
  Administered 2014-12-28 – 2014-12-31 (×9): 666 mg via ORAL
  Filled 2014-12-28 (×2): qty 2
  Filled 2014-12-28: qty 84
  Filled 2014-12-28: qty 2
  Filled 2014-12-28: qty 84
  Filled 2014-12-28 (×7): qty 2
  Filled 2014-12-28: qty 84
  Filled 2014-12-28 (×2): qty 2

## 2014-12-28 NOTE — Progress Notes (Signed)
D:  Per pt self inventory pt reports sleeping fair with prescribed sleep medication, appetite fair, energy level low, ability to pay attention poor, rates depression at a 6 out of 10, hopelessness at a 7 out of 10, anxiety at a 7 out of 10, pt is c/o some withdrawal symptoms but states that he feels as though they are improving, pt's goal is to try and concentrate better and his plan to achieve his goal is to pay more attention to things, pt was flat/depressed during interactiong, denies SI/HI/AVH presently.     A:  Emotional support provided, Encouraged pt to continue with treatment plan and attend all group activities, q15 min checks maintained for safety.  R:  Pt is anxious but cooperative with staff and other patients on the unit.

## 2014-12-28 NOTE — BHH Group Notes (Signed)
[place] LCSW Group Therapy 12/28/2014 1:15 PM Type of Therapy: Group Therapy Participation Level: Minimal  Participation Quality: Attentive  Affect: Depressed and Flat  Cognitive: Alert and Oriented  Insight: Developing/Improving and Engaged  Engagement in Therapy: Developing/Improving and Engaged  Modes of Intervention: Clarification, Confrontation, Discussion, Education, Exploration, Limit-setting, Orientation, Problem-solving, Rapport Building, Art therapist, Socialization and Support  Summary of Progress/Problems: The topic for today was feelings about relapse. Pt discussed what relapse prevention is to them and identified triggers that they are on the path to relapse. Pt processed their feeling towards relapse and was able to relate to peers. Pt discussed coping skills that can be used for relapse prevention. Patient actively listened during discussion but did not participate despite encouragement.   Tilden Fossa, MSW, [place] Worker [facility] [phone]

## 2014-12-28 NOTE — Progress Notes (Signed)
Pt attended wrap-up group. He stated that even though he is experiencing some withdrawal symptoms, he is feeling a little better. Today, He has just being laying down trying to get to feeling better.

## 2014-12-28 NOTE — Progress Notes (Signed)
[facility] MD Progress Note  12/28/2014 2:44 PM Mukhtar Shams  MRN:  [id number] Subjective:  Patient states he is starting to feel " a little better" Objective : I have discussed case with treatment team and have met with patient. He continues to report symptoms of alcohol withdrawal, such as some vague anxiety, feeling jittery. He does seem better than upon admission, and at this time he is not diaphoretic or agitated/restless. Distal tremors seem improved, although still present. He remains depressed, sad, and endorses a sense of sadness, decreased self esteem, and anhedonia. He is denying any SI. On unit, remains somewhat isolative in his room, but has started going to some groups. Denies medication side effects, and is tolerating Ativan taper well.  Describes significant cravings for alcohol, which has contributed to relapses in the past. We discussed options and he is agreeing to Campral trial- we reviewed side effects and rationale. Principal Problem: Alcohol dependence with alcohol-induced mood disorder Diagnosis:   Patient Active Problem List   Diagnosis Date Noted  . Alcohol dependence with alcohol-induced mood disorder [F10.24]   . Suicidal ideation [R45.851]   . GAD (generalized anxiety disorder) [F41.1] 09/18/2014  . Recurrent major depression-severe [F33.2] 09/18/2014  . Alcohol abuse with alcohol-induced mood disorder [F10.14] 09/17/2014  . Alcohol intoxication [F10.129]   . DVT, lower extremity [I82.409]   . Left leg DVT [I82.402] 06/06/2014  . Alcohol dependence [F10.20] 01/27/2014  . Upper leg DVT (deep venous thromboembolism), acute [I82.4Y9] 06/12/2013  . Homeless single person [Z59.0] 06/12/2013  . 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 Cellulitis of leg, left [L03.116] 01/03/2013  . Subtherapeutic international normalized ratio (INR) [R79.1] 12/18/2012  . Alcohol withdrawal [F10.239] 11/03/2012   . Major depressive disorder, recurrent episode, severe, without mention of psychotic behavior [F33.2] 11/03/2012  . Coumadin toxicity [T60.4X1A] 05/20/2012  . Hematuria [R31.9] 05/20/2012  . Deep vein thrombosis [I82.409] 04/13/2012  . DVT of lower limb, acute [I82.409] 03/28/2012  . 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 DVT of lower extremity, bilateral [I82.403] 01/28/2012  . Anemia [D64.9] 01/28/2012  . Degenerative joint disease [M19.90]   . Alcohol abuse [F10.10] 03/01/2011  . Tobacco abuse [Z72.0] 03/01/2011   Total Time spent with patient: 20 minutes   Past Medical History:  Past Medical History  Diagnosis Date  . Coronary artery disease   . Degenerative joint disease of spine   . Pulmonary embolism 06/2010  . Degenerative joint disease   . 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 ETOH abuse   . DVT (deep venous thrombosis)   . Hepatitis B   . Mental disorder   . Depression     Past Surgical History  Procedure Laterality Date  . Left elbow surgery    . Tonsillectomy     Family History:  Family History  Problem Relation Age of Onset  . Cancer Mother     Ovarian   Social History:  History  Alcohol Use  . 14.4 oz/week  . 24 Cans of beer per week    Comment: cas of beer daily     History  Drug Use No    History   Social History  . Marital Status: Legally Separated    Spouse Name: N/A  . Number of Children: N/A  . Years of Education: N/A   Occupational History  . Homeless   .     Social History Main Topics  . Smoking status: Current Every Day Smoker --  2.00 packs/day for 30 years    Types: Cigarettes  . Smokeless tobacco: Never Used  . Alcohol Use: 14.4 oz/week    24 Cans of beer per week     Comment: cas of beer daily  . Drug Use: No  . Sexual Activity: No   Other Topics Concern  . None   Social History Narrative   Estranged from family.  Homeless.     Additional History:    Sleep: improved   Appetite:  Fair     Assessment:   Musculoskeletal: Strength & Muscle Tone: within normal limits- decreased distal tremors, no acute agitation or restlessness noted . Gait & Station: normal Patient leans: N/A   Psychiatric Specialty Exam: Physical Exam  Review of Systems  Constitutional: Negative for fever and chills.  Eyes: Negative.   Respiratory: Positive for cough. Negative for hemoptysis and shortness of breath.        Dry cough , which he attributes to being a smoker    Cardiovascular: Negative for chest pain.  Gastrointestinal: Negative for nausea, vomiting, blood in stool and melena.  Genitourinary: Negative for dysuria, urgency and frequency.  Skin: Negative for itching and rash.  Neurological: Positive for tremors. Negative for headaches.  Endo/Heme/Allergies: Negative.        Denies  Bleeding   Psychiatric/Behavioral: Positive for depression and substance abuse.    Blood pressure 154/92, pulse 55, temperature 97.9 F (36.6 C), temperature source Oral, resp. rate 16, height 6' 2"  (1.88 m), weight 204 lb (92.534 kg).Body mass index is 26.18 kg/(m^2).  General Appearance: Disheveled  Eye Contact::  Good  Speech:  Slow  Volume:  Decreased  Mood:  depressed, but improving  Affect:  Constricted  Thought Process:  Linear  Orientation:  Other:  fully alert and attentive   Thought Content:  denies hallucinations, no delusions, not internally preoccupied   Suicidal Thoughts:  No at this time denies any thoughts of hurting self and  contracts for safety on unit   Homicidal Thoughts:  No  Memory:   Recent and remote fair   Judgement:  Fair  Insight:  Fair  Psychomotor Activity:  Decreased- no significant restlessness or agitation at this time  Concentration:  Good  Recall:  Good  Fund of Knowledge:Good  Language: Good  Akathisia:  Negative  Handed:  Left  AIMS (if indicated):     Assets:  Desire for Improvement Resilience  ADL's:  Impaired  Cognition: WNL  Sleep:  Number of  Hours: 6.75     Current Medications: Current Facility-Administered Medications  Medication Dose Route Frequency Provider Last Rate Last Dose  . acetaminophen (TYLENOL) tablet 650 mg  650 mg Oral Q6H PRN Laverle Hobby, PA-C      . alum & mag hydroxide-simeth (MAALOX/MYLANTA) 200-200-20 MG/5ML suspension 30 mL  30 mL Oral Q4H PRN Laverle Hobby, PA-C      . citalopram (CELEXA) tablet 20 mg  20 mg Oral Daily Laverle Hobby, PA-C   20 mg at 12/28/14 1937  . hydrOXYzine (ATARAX/VISTARIL) tablet 25 mg  25 mg Oral Q6H PRN Laverle Hobby, PA-C   25 mg at 12/28/14 0813  . loperamide (IMODIUM) capsule 2-4 mg  2-4 mg Oral PRN Laverle Hobby, PA-C      . LORazepam (ATIVAN) tablet 1 mg  1 mg Oral Q6H PRN Laverle Hobby, PA-C      . LORazepam (ATIVAN) tablet 1 mg  1 mg Oral TID Laverle Hobby, PA-C  1 mg at 12/28/14 1152   Followed by  . [START ON 12/29/2014] LORazepam (ATIVAN) tablet 1 mg  1 mg Oral BID Laverle Hobby, PA-C       Followed by  . [START ON 12/30/2014] LORazepam (ATIVAN) tablet 1 mg  1 mg Oral Daily Spencer E Simon, PA-C      . magnesium hydroxide (MILK OF MAGNESIA) suspension 30 mL  30 mL Oral Daily PRN Laverle Hobby, PA-C      . multivitamin with minerals tablet 1 tablet  1 tablet Oral Daily Laverle Hobby, PA-C   1 tablet at 12/28/14 [id number]  . nicotine (NICODERM CQ - dosed in mg/24 hours) patch 21 mg  21 mg Transdermal Q0600 Jenne Campus, MD   21 mg at 12/28/14 0811  . ondansetron (ZOFRAN-ODT) disintegrating tablet 4 mg  4 mg Oral Q6H PRN Laverle Hobby, PA-C   4 mg at 12/28/14 0813  . rivaroxaban (XARELTO) tablet 20 mg  20 mg Oral Daily Laverle Hobby, PA-C   20 mg at 12/28/14 7412  . thiamine (B-1) injection 100 mg  100 mg Intramuscular Once Laverle Hobby, PA-C   100 mg at 12/27/14 0100  . thiamine (VITAMIN B-1) tablet 100 mg  100 mg Oral Daily Laverle Hobby, PA-C   100 mg at 12/28/14 8786  . traZODone (DESYREL) tablet 100 mg  100 mg Oral QHS,MR X 1 Laverle Hobby,  PA-C   100 mg at 12/27/14 2101    Lab Results: No results found for this or any previous visit (from the past 48 hour(s)).  Physical Findings: AIMS: Facial and Oral Movements Muscles of Facial Expression: None, normal Lips and Perioral Area: None, normal Jaw: None, normal Tongue: None, normal,Extremity Movements Upper (arms, wrists, hands, fingers): None, normal Lower (legs, knees, ankles, toes): None, normal, Trunk Movements Neck, shoulders, hips: None, normal, Overall Severity Severity of abnormal movements (highest score from questions above): None, normal Incapacitation due to abnormal movements: None, normal Patient's awareness of abnormal movements (rate only patient's report): No Awareness, Dental Status Current problems with teeth and/or dentures?: No Does patient usually wear dentures?: No  CIWA:  CIWA-Ar Total: 6 COWS:      Assessment- patient  Is a 49 year old male with alcohol dependence and Depression. Has had DTs and WDL seizures in the past, but is currently doing better on day 3-4 since last drink. Remains depressed but not suicidal. No current evidence of delirium.   Treatment Plan Summary: Daily contact with patient to assess and evaluate symptoms and progress in treatment, Medication management, Plan continue inpatient treatment  and continue medications as below  Continue Ativan Alcohol Detox Protocol for alcohol WDL management Continue Celexa 20 mgrs QDAY for Depression Start Campral 666 mgrs TID for Alcohol Cravings Patient interested in going to a Rehab setting after discharge if possible   Medical Decision Making:  Established Problem, Stable/Improving (1), Review of Psycho-Social Stressors (1), Review or order clinical lab tests (1) and Review of New Medication or Change in Dosage (2)     COBOS, FERNANDO 12/28/2014, 2:44 PM

## 2014-12-28 NOTE — Tx Team (Signed)
Interdisciplinary Treatment Plan Update (Adult) Date: 12/28/2014   Time Reviewed: 9:30 AM  Progress in Treatment: Attending groups: Minimally  Participating in groups: Minimally Taking medication as prescribed: Yes Tolerating medication: Yes Family/Significant other contact made: No, patient has declined for collateral contact. Patient understands diagnosis: Yes Discussing patient identified problems/goals with staff: Yes Medical problems stabilized or resolved: Yes Denies suicidal/homicidal ideation: Yes Issues/concerns per patient self-inventory: Yes Other:  New problem(s) identified: N/A  Discharge Plan or Barriers:   4/22: CSW continuing to assess. Patient is agreeable to referrals to [facility] and [facility]. Patient is homeless in [place].  Reason for Continuation of Hospitalization:  Depression Anxiety Medication Stabilization   Comments: N/A  Estimated length of stay: 2-3 days  For review of initial/current patient goals, please see plan of care.  [name] is a 49 years old Caucasian male admitted with Major Depression Disorder and SI. He will benefit from crisis stabilization, evaluation for medication, psycho-education groups for coping skills development, group therapy and case management for discharge planning.   Attendees: Patient:    Family:    Physician: Dr. Parke Poisson; Dr. Sabra Heck 12/28/2014 9:30 AM  Nursing: Markham Jordan, Charlyne Quale, Nehemiah Settle , RN 12/28/2014 9:30 AM  Clinical Social Worker: Tilden Fossa,  [place] 12/28/2014 9:30 AM  Other: Maxie Better, LCSWA  12/28/2014 9:30 AM  Other:  12/28/2014 9:30 AM  Other: Lars Pinks, Case Manager 12/28/2014 9:30 AM  Other: Ave Filter NP 12/28/2014 9:30 AM  Other:    Other:    Other:    Other:    Other:       Scribe for Treatment Team:  Tilden Fossa, MSW, [employer] [phone]

## 2014-12-28 NOTE — Plan of Care (Signed)
Problem: Alteration in mood & ability to function due to Goal: STG-Patient will comply with prescribed medication regimen (Patient will comply with prescribed medication regimen)  Outcome: Progressing Pt has been compliant with scheduled medications this shift.

## 2014-12-28 NOTE — Progress Notes (Signed)
Patient denied SI and HI, contracts for safety.  Denied A/V hallucinations.  Denied pain.  Patient stated he has had a good day.

## 2014-12-28 NOTE — BHH Group Notes (Signed)
   [facility] LCSW Aftercare Discharge Planning Group Note  12/28/2014  8:45 AM   Participation Quality: Alert, Appropriate and Oriented  Mood/Affect: Depressed and Flat  Depression Rating: 6  Anxiety Rating: 7  Thoughts of Suicide: Pt denies SI/HI  Will you contract for safety? Yes  Current AVH: Pt denies  Plan for Discharge/Comments: Pt attended discharge planning group and actively participated in group. CSW provided pt with today's workbook. Patient reports feeling "a little better" today but continues to look flat and depressed. Patient continues to express interest in residential treatment. CSW working on [employer] and [employer].  Transportation Means: Pt reports access to transportation  Supports: No supports mentioned at this time  Tilden Fossa, MSW, [place] Social Worker [employer] [phone]

## 2014-12-28 NOTE — Clinical Social Work Note (Signed)
Referral initiated to [facility].  Tilden Fossa, MSW, [place] Worker [facility] [phone]

## 2014-12-28 NOTE — Clinical Social Work Note (Signed)
CSW left voicemail for [place] at [facility] regarding initiating referral.  Tilden Fossa, MSW, [place] Worker [facility] [phone]

## 2014-12-29 MED ORDER — TRAZODONE HCL 100 MG PO TABS
200.0000 mg | ORAL_TABLET | Freq: Every evening | ORAL | Status: DC | PRN
  Administered 2014-12-29 – 2014-12-30 (×2): 200 mg via ORAL
  Filled 2014-12-29: qty 28
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
## 2014-12-29 NOTE — BHH Group Notes (Signed)
[place] Group Notes:  (Nursing/MHT/Case Management/Adjunct)  Date:  12/29/2014  Time:  4:26 PM  Type of Therapy:  Psychoeducational Skills  Participation Level:  Active  Participation Quality:  Appropriate  Affect:  Appropriate  Cognitive:  Appropriate  Insight:  Appropriate  Engagement in Group:  Engaged  Modes of Intervention:  Discussion  Summary of Progress/Problems: Pt did attend self inventory group, pt reported that he was negative SI/HI, no AH/VH noted. Pt rated his depression as a 6, and his helplessness/hopelessness as a 8.     Pt reported concerns about not being able to sleep last night, pt advised that the doctor will be made aware. Mary NP made aware new orders noted.   Benancio Deeds Shanta 12/29/2014, 4:26 PM

## 2014-12-29 NOTE — Progress Notes (Signed)
D: Pt has depressed affect and mood.  Pt reports his goal today was "to try to concentrate a little bit better, I'm trying." Pt denies SI/HI, denies hallucinations, denies pain, denies withdrawal symptoms.  Pt has been visible in milieu with minimal peer interaction. Pt attended evening group.   A: Met with pt 1:1 and provided support and encouragement.  Actively listened to pt.  Medications administered per order.  PRN medication administered for sleep.  PO fluids encouraged and provided. R: Pt is compliant with medications.  Pt verbally contracts for safety.  Will continue to monitor and assess.

## 2014-12-29 NOTE — BHH Group Notes (Signed)
[place] Group Notes:  (Clinical Social Work)  12/29/2014     10-11AM  Summary of Progress/Problems:   The main focus of today's process group was to learn how to use a decisional balance exercise to move forward in the Stages of Change, which were described and discussed.  Motivational Interviewing and a worksheet were utilized to help patients explore in depth the perceived benefits and costs of a self-sabotaging behavior, as well as the  benefits and costs of replacing that with a healthy coping mechanism.   The patient expressed he uses the healthy coping skills of taking long walks which focuses his mind.  His unhealthy coping mechanism is "getting drunk."  Type of Therapy:  Group Therapy - Process   Participation Level:  Active  Participation Quality:  Attentive  Affect:  Blunted and Depressed  Cognitive:  Alert  Insight:  Developing/Improving  Engagement in Therapy:  Developing/Improving  Modes of Intervention:  Education, Motivational Interviewing  Selmer Dominion, LCSW 12/29/2014, 12:23 PM

## 2014-12-29 NOTE — Progress Notes (Signed)
Patient did attend the evening speaker AA meeting.  

## 2014-12-29 NOTE — Plan of Care (Signed)
Problem: Alteration in mood & ability to function due to Goal: LTG-Pt reports reduction in suicidal thoughts (Patient reports reduction in suicidal thoughts and is able to verbalize a safety plan for whenever patient is feeling suicidal)  Outcome: Progressing Pt denied SI this shift and verbally contracted for safety.

## 2014-12-29 NOTE — Progress Notes (Signed)
Patient ID: [name], male   DOB: 1965/10/12, 49 y.o.   MRN: [id number]   D: Pt has been very flat and depressed this morning. Pt reported lots of anxiety and withdrawal symptoms this morning and was given prn Vistaril and Ativan. Pt reported on his self inventory sheet that his depression was a 6, his hopelessness was a 7, and his anxiety was a 8. Pt reported that his goal for today was to try to get energy level up. Pt attended all groups and engaged in treatment. Pt reported being negative SI/HI, no AH/VH noted. A: 15 min checks continued for patient safety. R: Pt safety maintained.

## 2014-12-30 NOTE — Progress Notes (Signed)
Patient ID: [name], male   DOB: 08-Sep-1965, 49 y.o.   MRN: [id number]   D: Pt has been very flat and depressed today on the unit. Pt reported that he felt a little better but continued to have withdrawal symptoms. Pt was given Vistaril this morning for anxiety. Pt reported on his self inventory sheet that that his depression was a 6, his hopelessness was a 8, and his anxiety was a 6. Pt reported that his goal for today was to make all meetings. Pt reported being negative SI/HI, no AH/VH noted. A: 15 min checks continued for patient safety. R: Pt safety maintained.

## 2014-12-30 NOTE — Progress Notes (Signed)
Patient ID: [name], male   DOB: [birth year], 49 y.o.   MRN: [id number] D)  Has been spending part of the evening in his room, but did come out to the 300 hall dayroom to attend group for AA.  Has been quiet with few requests, but did ask for tylenol for a headache.  Has been pleasant and cooperative, but affect is flat and depressed.  Has been compliant with meds and programming, went to bed shortly after snack. A)  Will continue to monitor for safety, continue POC R)  Safety maintained.

## 2014-12-30 NOTE — BHH Group Notes (Signed)
[place] Group Notes: (Clinical Social Work)   12/30/2014      Type of Therapy:  Group Therapy   Participation Level:  Did Not Attend despite MHT prompting   Selmer Dominion, LCSW 12/30/2014, 12:33 PM

## 2014-12-30 NOTE — Progress Notes (Signed)
[facility] MD Progress Note  12/30/2014 9:36 AM Stpehen Petitjean  MRN:  [id number]    Subjective:  Patient states he is starting to feel " a little better", but 'I didn't sleep because my roommate snores".  Also reports lack of participation in groups is related to cold room  Reports symptoms of ETOH withdrawal have subsided; no tremor, diarrhea, stomach cramps.    Rates Depression  6/10   Anxiety   8/10   Denies SIHI, contracts for safety  Hopes to access an ETOH rehabilitation on discharge.    Tells me prior to admission  he drinks a case of beer a day or /fifth of vodka    Objective : He is assess in bed, (5361)  calm and cooperative.  No Tremor noted today He endorses a sense of sadness and cannot remember a time he was happy. Denies medication side effects, and is tolerating Ativan taper well.    Principal Problem: Alcohol dependence with alcohol-induced mood disorder Diagnosis:   Patient Active Problem List   Diagnosis Date Noted  . Alcohol dependence with alcohol-induced mood disorder [F10.24]   . Suicidal ideation [R45.851]   . GAD (generalized anxiety disorder) [F41.1] 09/18/2014  . Recurrent major depression-severe [F33.2] 09/18/2014  . Alcohol abuse with alcohol-induced mood disorder [F10.14] 09/17/2014  . Alcohol intoxication [F10.129]   . DVT, lower extremity [I82.409]   . Left leg DVT [I82.402] 06/06/2014  . Alcohol dependence [F10.20] 01/27/2014  . Upper leg DVT (deep venous thromboembolism), acute [I82.4Y9] 06/12/2013  . Homeless single person [Z59.0] 06/12/2013  . 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 Cellulitis of leg, left [L03.116] 01/03/2013  . Subtherapeutic international normalized ratio (INR) [R79.1] 12/18/2012  . Alcohol withdrawal [F10.239] 11/03/2012  . Major depressive disorder, recurrent episode, severe, without mention of psychotic behavior [F33.2] 11/03/2012  . Coumadin  toxicity [T60.4X1A] 05/20/2012  . Hematuria [R31.9] 05/20/2012  . Deep vein thrombosis [I82.409] 04/13/2012  . DVT of lower limb, acute [I82.409] 03/28/2012  . 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 DVT of lower extremity, bilateral [I82.403] 01/28/2012  . Anemia [D64.9] 01/28/2012  . Degenerative joint disease [M19.90]   . Alcohol abuse [F10.10] 03/01/2011  . Tobacco abuse [Z72.0] 03/01/2011   Total Time spent with patient: 25 minutes   Past Medical History:  Past Medical History  Diagnosis Date  . Coronary artery disease   . Degenerative joint disease of spine   . Pulmonary embolism 06/2010  . Degenerative joint disease   . 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 ETOH abuse   . DVT (deep venous thrombosis)   . Hepatitis B   . Mental disorder   . Depression     Past Surgical History  Procedure Laterality Date  . Left elbow surgery    . Tonsillectomy     Family History:  Family History  Problem Relation Age of Onset  . Cancer Mother     Ovarian   Social History:  History  Alcohol Use  . 14.4 oz/week  . 24 Cans of beer per week    Comment: cas of beer daily     History  Drug Use No    History   Social History  . Marital Status: Legally Separated    Spouse Name: N/A  . Number of Children: N/A  . Years of Education: N/A   Occupational History  . Homeless   .     Social History Main Topics  .  Smoking status: Current Every Day Smoker -- 2.00 packs/day for 30 years    Types: Cigarettes  . Smokeless tobacco: Never Used  . Alcohol Use: 14.4 oz/week    24 Cans of beer per week     Comment: cas of beer daily  . Drug Use: No  . Sexual Activity: No   Other Topics Concern  . None   Social History Narrative   Estranged from family.  Homeless.     Additional History:    Sleep: improved   Appetite:  Fair    Assessment:   Musculoskeletal: Strength & Muscle Tone: within normal limits- decreased distal tremors, no acute agitation or  restlessness noted . Gait & Station: normal Patient leans: N/A   Psychiatric Specialty Exam: Physical Exam  Constitutional: He is oriented to person, place, and time. He appears well-developed and well-nourished.  HENT:  Head: Normocephalic and atraumatic.  Musculoskeletal: Normal range of motion.  Neurological: He is alert and oriented to person, place, and time.  Skin: Skin is warm and dry.    ROS  Blood pressure 91/61, pulse 102, temperature 98 F (36.7 C), temperature source Oral, resp. rate 18, height 6\' 2"  (1.88 m), weight 92.534 kg (204 lb).Body mass index is 26.18 kg/(m^2).  General Appearance: casual   Eye Contact::  Good  Speech:  Slow  Volume:  Decreased  Mood:  depressed, but improving  Affect:  Constricted  Thought Process: goal directed  Orientation:  Other:  fully alert and attentive   Thought Content:  denies hallucinations, no delusions, not internally preoccupied   Suicidal Thoughts:  No at this time denies any thoughts of hurting self and  contracts for safety on unit   Homicidal Thoughts:  No  Memory:   Recent and remote fair   Judgement:  Fair  Insight:  Fair  Psychomotor Activity:  Decreased- no significant restlessness or agitation at this time  Concentration:  Good  Recall:  Good  Fund of Knowledge:Good  Language: Good  Akathisia:  Negative  Handed:  Left  AIMS (if indicated):     Assets:  Desire for Improvement Resilience  ADL's:  Impaired  Cognition: WNL  Sleep:  Number of Hours: 5.5     Current Medications: Current Facility-Administered Medications  Medication Dose Route Frequency Provider Last Rate Last Dose  . acamprosate (CAMPRAL) tablet 666 mg  666 mg Oral TID WC Jenne Campus, MD   666 mg at 12/30/14 8937  . acetaminophen (TYLENOL) tablet 650 mg  650 mg Oral Q6H PRN Laverle Hobby, PA-C   650 mg at 12/29/14 1954  . alum & mag hydroxide-simeth (MAALOX/MYLANTA) 200-200-20 MG/5ML suspension 30 mL  30 mL Oral Q4H PRN Laverle Hobby,  PA-C      . citalopram (CELEXA) tablet 20 mg  20 mg Oral Daily Laverle Hobby, PA-C   20 mg at 12/30/14 3428  . hydrOXYzine (ATARAX/VISTARIL) tablet 25 mg  25 mg Oral Q6H PRN Laverle Hobby, PA-C   25 mg at 12/30/14 7681  . magnesium hydroxide (MILK OF MAGNESIA) suspension 30 mL  30 mL Oral Daily PRN Laverle Hobby, PA-C      . multivitamin with minerals tablet 1 tablet  1 tablet Oral Daily Laverle Hobby, PA-C   1 tablet at 12/30/14 [id number]  . nicotine (NICODERM CQ - dosed in mg/24 hours) patch 21 mg  21 mg Transdermal Q0600 Jenne Campus, MD   21 mg at 12/30/14 0738  . rivaroxaban (XARELTO)  tablet 20 mg  20 mg Oral Daily Laverle Hobby, PA-C   20 mg at 12/30/14 1660  . thiamine (B-1) injection 100 mg  100 mg Intramuscular Once Laverle Hobby, PA-C   100 mg at 12/27/14 0100  . thiamine (VITAMIN B-1) tablet 100 mg  100 mg Oral Daily Laverle Hobby, PA-C   100 mg at 12/30/14 6004  . traZODone (DESYREL) tablet 200 mg  200 mg Oral QHS PRN Knox Royalty, NP   200 mg at 12/29/14 2107    Lab Results: No results found for this or any previous visit (from the past 48 hour(s)).  Physical Findings: AIMS: Facial and Oral Movements Muscles of Facial Expression: None, normal Lips and Perioral Area: None, normal Jaw: None, normal Tongue: None, normal,Extremity Movements Upper (arms, wrists, hands, fingers): None, normal Lower (legs, knees, ankles, toes): None, normal, Trunk Movements Neck, shoulders, hips: None, normal, Overall Severity Severity of abnormal movements (highest score from questions above): None, normal Incapacitation due to abnormal movements: None, normal Patient's awareness of abnormal movements (rate only patient's report): No Awareness, Dental Status Current problems with teeth and/or dentures?: No Does patient usually wear dentures?: No  CIWA:  CIWA-Ar Total: 1 COWS:      Assessment- patient  Is a 49 year old male with alcohol dependence and Depression. Has had DTs and WDL  seizures in the past, but is currently doing better on day 4-5  since last drink. Remains depressed but not suicidal. No current evidence of delirium.   Treatment Plan Summary: Daily contact with patient to assess and evaluate symptoms and progress in treatment, Medication management, Plan continue inpatient treatment  and continue medications as below  Continue Ativan Alcohol Detox Protocol for alcohol WDL management Continue Celexa 20 mgrs QDAY for Depression Campral 666 mgrs TID for Alcohol Cravings-continues to have cravings Patient interested in going to a Rehab setting after discharge if possible (discussed with SW and she reports referrals are in place)   Medical Decision Making:  Established Problem, Stable/Improving (1), Review of Psycho-Social Stressors (1), Review or order clinical lab tests (1) and Review of New Medication or Change in Dosage (2)   [place], [place] 12/30/2014, 9:36 AM I agree with assessment and plan Geralyn Flash A. Sabra Heck, M.D.

## 2014-12-30 NOTE — Progress Notes (Addendum)
Patient ID: [name], male   DOB: [birth year], 49 y.o.   MRN: [id number] D)  Has been rather quiet this evening, but did come out for group and programmed on 300 hall, AA group.  Has been pleasant and cooperative, affect is flat, depressed mood.  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  Said he drank to keep from thinking about his situation. A)  Support, encouraged to talk to CM about his needs for a program, will continue to monitor for safety, continue POC R)  Safety maintained. Addendum:  Pt wanted to check his locker for phone numbers in wallet for a ride when d/c'd, but wallet not in his locker, nor was his thermal shirt.  Called [facility] but they said we would need to call tomorrow and have medical records pull his inventory sheet to see if he had those items when he was over there. He was also in TCU before being sent here, and may also need to check their records.  They are not listed on his sheet here. Pt is worried, as his NCID is in wallet, other cards.  Reassured him we would continue the search in am.

## 2014-12-30 NOTE — Progress Notes (Addendum)
Report from Shalita-pt was given 25mg  of visteral at 4;30pm for his nerves. He stated he rated his anxiousness a 5/10.

## 2014-12-31 DIAGNOSIS — F1024 Alcohol dependence with alcohol-induced mood disorder: Principal | ICD-10-CM

## 2014-12-31 MED ORDER — ACAMPROSATE CALCIUM 333 MG PO TBEC: 666.0000 mg | DELAYED_RELEASE_TABLET | Freq: Three times a day (TID) | ORAL | Status: DC

## 2014-12-31 MED ORDER — HYDROXYZINE HCL 25 MG PO TABS: 25.0000 mg | ORAL_TABLET | Freq: Four times a day (QID) | ORAL | Status: DC | PRN

## 2014-12-31 MED ORDER — TRAZODONE HCL 100 MG PO TABS: 200.0000 mg | ORAL_TABLET | Freq: Every evening | ORAL | Status: DC | PRN

## 2014-12-31 MED ORDER — CITALOPRAM HYDROBROMIDE 20 MG PO TABS: 20.0000 mg | ORAL_TABLET | Freq: Every day | ORAL | Status: DC

## 2014-12-31 MED ORDER — RIVAROXABAN 20 MG PO TABS: 20.0000 mg | ORAL_TABLET | Freq: Every day | ORAL | Status: DC

## 2014-12-31 MED ORDER — NICOTINE 21 MG/24HR TD PT24: 21.0000 mg | MEDICATED_PATCH | Freq: Every day | TRANSDERMAL | Status: DC

## 2014-12-31 MED ORDER — ADULT MULTIVITAMIN W/MINERALS CH: 1.0000 | ORAL_TABLET | Freq: Every day | ORAL | Status: DC

## 2014-12-31 NOTE — Discharge Summary (Signed)
Physician Discharge Summary Note  Patient:  [name] is an 49 y.o., male MRN:  [id number] DOB:  August 21, 1966 Patient phone:  [phone] (home)  Patient address:   [place] [address],  Total Time spent with patient: 30 minutes  Date of Admission:  12/26/2014 Date of Discharge: 12/31/14  Reason for Admission:  Depression, Alcohol abuse   Principal Problem: Alcohol dependence with alcohol-induced mood disorder Discharge Diagnoses: Patient Active Problem List   Diagnosis Date Noted  . Alcohol dependence with alcohol-induced mood disorder [F10.24]   . Suicidal ideation [R45.851]   . GAD (generalized anxiety disorder) [F41.1] 09/18/2014  . Recurrent major depression-severe [F33.2] 09/18/2014  . Alcohol abuse with alcohol-induced mood disorder [F10.14] 09/17/2014  . Alcohol intoxication [F10.129]   . DVT, lower extremity [I82.409]   . Left leg DVT [I82.402] 06/06/2014  . Alcohol dependence [F10.20] 01/27/2014  . Upper leg DVT (deep venous thromboembolism), acute [I82.4Y9] 06/12/2013  . Homeless single person [Z59.0] 06/12/2013  . 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 Cellulitis of leg, left [L03.116] 01/03/2013  . Subtherapeutic international normalized ratio (INR) [R79.1] 12/18/2012  . Alcohol withdrawal [F10.239] 11/03/2012  . Major depressive disorder, recurrent episode, severe, without mention of psychotic behavior [F33.2] 11/03/2012  . Coumadin toxicity [T60.4X1A] 05/20/2012  . Hematuria [R31.9] 05/20/2012  . Deep vein thrombosis [I82.409] 04/13/2012  . DVT of lower limb, acute [I82.409] 03/28/2012  . 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 DVT of lower extremity, bilateral [I82.403] 01/28/2012  . Anemia [D64.9] 01/28/2012  . Degenerative joint disease [M19.90]   . Alcohol abuse [F10.10] 03/01/2011  . Tobacco abuse [Z72.0] 03/01/2011     Musculoskeletal: Strength & Muscle Tone: within normal limits Gait & Station: normal Patient leans: N/A  Psychiatric Specialty Exam: Physical Exam  Psychiatric: He has a normal mood and affect. His speech is normal and behavior is normal. Judgment and thought content normal. Cognition and memory are normal.    Review of Systems  Constitutional: Negative.   HENT: Negative.   Eyes: Negative.   Respiratory: Negative.   Cardiovascular: Negative.   Gastrointestinal: Negative.   Genitourinary: Negative.   Musculoskeletal: Negative.   Skin: Negative.   Neurological: Negative.   Endo/Heme/Allergies: Negative.   Psychiatric/Behavioral: Positive for depression (Stabilized with treatments). Negative for suicidal ideas, hallucinations, memory loss and substance abuse. The patient is not nervous/anxious and does not have insomnia.     Blood pressure 116/83, pulse 100, temperature 98.2 F (36.8 C), temperature source Oral, resp. rate 16, height 6\' 2"  (1.88 m), weight 92.534 kg (204 lb).Body mass index is 26.18 kg/(m^2).  See Physician SRA    Past Medical History:  Past Medical History  Diagnosis Date  . Coronary artery disease   . Degenerative joint disease of spine   . Pulmonary embolism 06/2010  . Degenerative joint disease   . 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 ETOH abuse   . DVT (deep venous thrombosis)   . Hepatitis B   . Mental disorder   . Depression     Past Surgical History  Procedure Laterality Date  . Left elbow surgery    . Tonsillectomy     Family History:  Family History  Problem Relation Age of Onset  . Cancer Mother     Ovarian   Social History:  History  Alcohol Use  . 14.4 oz/week  . 24 Cans of beer per week    Comment: cas of  beer daily     History  Drug Use No    History   Social History  . Marital Status: Legally Separated    Spouse Name: N/A  . Number of Children: N/A  . Years of Education: N/A   Occupational History  .  Homeless   .     Social History Main Topics  . Smoking status: Current Every Day Smoker -- 2.00 packs/day for 30 years    Types: Cigarettes  . Smokeless tobacco: Never Used  . Alcohol Use: 14.4 oz/week    24 Cans of beer per week     Comment: cas of beer daily  . Drug Use: No  . Sexual Activity: No   Other Topics Concern  . None   Social History Narrative   Estranged from family.  Homeless.      Risk to Self: Is patient at risk for suicide?: Yes Risk to Others:   Prior Inpatient Therapy:   Prior Outpatient Therapy:    Level of Care:  OP  Hospital Course:    [name] is an 49 y.o. male with history of depression and alcohol. The patient presents to [facility] intoxicated attempting to cut his wrist with a broken beer bottle in hand stating that he wanted to kill everyone. Patient's BAL upon arrival was 400. Patient stated that he was suicidal and unable to contract for safety. Patient has a plan to cut self or drink self to death. He has felt suicidal "all my life". Patient has a history of suicide attempts by cutting and overdosing. His trigger for depression includes homelessness. He [place] and states that police have tried to destroy his property. Patient feels comfortable living in the woods stating, "It's my place of comfort/peace and people keep trying to destroy my property". Patient feels homicidal toward anyone that picks on him. He stated on admission that people are mean to him for no reason. Patient has no plan to harm others or specified a victim. He denies AVH's. Patient denies drug use. He stated that he started drinking at age 66. He binge drinks daily (day to night) until passing out. Patient has withdrawal symptoms on admission: tremors, hot/cold flashes, and irritation. He has received treatment at at [facility] 09/17/14, 11/02/12, and 01/27/14. Patient has also received inpatient treatment at another facility.         [name] was admitted to the adult  400 unit. He was evaluated and his symptoms were identified. Medication management was discussed and initiated. The patient completed the ativan protocol for the purpose of alcohol detox. He was later started on Campral 666 mg TID to address his problems with alcohol dependence. His Celexa 20 mg daily was continued for treatment of depression. His Trazodone was increased to 200 mg hs prn for treatment of insomnia.  He was oriented to the unit and encouraged to participate in unit programming. Medical problems were identified and treated appropriately. His Xarelto 20 mg daily was continued for treatment of deep vein thrombosis. Home medication was restarted as needed.        The patient was evaluated each day by a clinical provider to ascertain the patient's response to treatment.  Improvement was noted by the patient's report of decreasing symptoms, improved sleep and appetite, affect, medication tolerance, behavior, and participation in unit programming.  He was asked each day to complete a self inventory noting mood, mental status, pain, new symptoms, anxiety and concerns.  He responded well to medication and being in a therapeutic and supportive environment. Patient reported that his symptoms of alcohol withdrawal subsided as the ativan protocol progressed. He continued to have some symptoms of depression but denied suicidal ideation.  Positive and appropriate behavior was noted and the patient was motivated for recovery.  The patient worked closely with the treatment team and case manager to develop a discharge plan with appropriate goals. Coping skills, problem solving as well as relaxation therapies were also part of the unit programming. He reported a gradual improvement in his depressive symptoms as treatment progressed. Patient expressed interest in attending an alcohol rehabilitation program upon discharge from the hospital. The patient actively participated in [facility] meetings during his hospital  admission.          By the day of discharge he was in much improved condition than upon admission.  Symptoms were reported as significantly decreased or resolved completely. The patient denied SI/HI and voiced no AVH. He was motivated to continue taking medication with a goal of continued improvement in mental health.  [name] was discharged home with a plan to follow up as noted below. The patient was provided with sample medications and prescriptions at time of discharge. He left [facility] in stable condition with all belongings returned to him.   Consults:  psychiatry  Significant Diagnostic Studies:  Chemistry panel, Lipid panel, CBC,   Discharge Vitals:   Blood pressure 116/83, pulse 100, temperature 98.2 F (36.8 C), temperature source Oral, resp. rate 16, height 6\' 2"  (1.88 m), weight 92.534 kg (204 lb). Body mass index is 26.18 kg/(m^2). Lab Results:   No results found for this or any previous visit (from the past 72 hour(s)).  Physical Findings: AIMS: Facial and Oral Movements Muscles of Facial Expression: None, normal Lips and Perioral Area: None, normal Jaw: None, normal Tongue: None, normal,Extremity Movements Upper (arms, wrists, hands, fingers): None, normal Lower (legs, knees, ankles, toes): None, normal, Trunk Movements Neck, shoulders, hips: None, normal, Overall Severity Severity of abnormal movements (highest score from questions above): None, normal Incapacitation due to abnormal movements: None, normal Patient's awareness of abnormal movements (rate only patient's report): No Awareness, Dental Status Current problems with teeth and/or dentures?: No Does patient usually wear dentures?: No  CIWA:  CIWA-Ar Total: 2 COWS:      See Psychiatric Specialty Exam and Suicide Risk Assessment completed by Attending Physician prior to discharge.  Discharge destination:  Home  Is patient on multiple antipsychotic therapies at discharge:  No   Has Patient had three or more  failed trials of antipsychotic monotherapy by history:  No  Recommended Plan for Multiple Antipsychotic Therapies: NA  Discharge Instructions    Discharge instructions    Complete by:  As directed   Please follow up with your Primary Care Provider as scheduled for further management of chronic medical problems and for further refills on Xarelto.            Medication List    TAKE these medications      Indication   acamprosate 333 MG tablet  Commonly known as:  CAMPRAL  Take 2 tablets (666 mg total) by mouth 3 (three) times daily with meals.   Indication:  Excessive Use of Alcohol     citalopram 20 MG tablet  Commonly known as:  CELEXA  Take 1 tablet (20 mg total) by mouth daily.   Indication:  Depression     hydrOXYzine 25 MG tablet  Commonly known as:  ATARAX/VISTARIL  Take 1 tablet (25 mg total) by mouth every 6 (six) hours as needed for anxiety.   Indication:  Anxiety Neurosis, Tension, Anxiety     multivitamin with minerals Tabs tablet  Take 1 tablet by mouth daily.   Indication:  Vitamin Supplementation     nicotine 21 mg/24hr patch  Commonly known as:  NICODERM CQ - dosed in mg/24 hours  Place 1 patch (21 mg total) onto the skin daily at 6 (six) AM. For nicotine addiction   Indication:  Nicotine Addiction     rivaroxaban 20 MG Tabs tablet  Commonly known as:  XARELTO  Take 1 tablet (20 mg total) by mouth daily with supper. (Start on 10-09-14 @ 8 am): For prevention of blood clot   Indication:  Blood Clot in a Deep Vein     traZODone 100 MG tablet  Commonly known as:  DESYREL  Take 2 tablets (200 mg total) by mouth at bedtime as needed for sleep.   Indication:  Trouble Sleeping           Follow-up Information    Follow up with [facility] On 01/03/2015.   Why:  Screening for possible admission on Thursday April 28th at 8 am. Please bring your ID and insurance card. Call office if you need to reschedule.    Contact information:   [address],  [place], [place] [address] Phone: [phone] Fax: [phone]      Follow-up recommendations:   Activity: as tolerated Diet: Regular Tests: NA Other: See below  Comments:   Take all your medications as prescribed by your mental healthcare provider.  Report any adverse effects and or reactions from your medicines to your outpatient provider promptly.  Patient is instructed and cautioned to not engage in alcohol and or illegal drug use while on prescription medicines.  In the event of worsening symptoms, patient is instructed to call the crisis hotline, 911 and or go to the nearest ED for appropriate evaluation and treatment of symptoms.  Follow-up with your primary care provider for your other medical issues, concerns and or health care needs.   Total Discharge Time: Greater than 30 minutes  Signed: DAVIS, LAURA NP-C 12/31/2014, 12:24 PM   Patient seen, Suicide Assessment Completed.  Disposition Plan Reviewed

## 2014-12-31 NOTE — [facility] Group Notes (Signed)
   [facility] LCSW Aftercare Discharge Planning Group Note  12/31/2014  8:45 AM   Participation Quality: Alert, Appropriate and Oriented  Mood/Affect: Appropriate  Depression Rating: 3  Anxiety Rating: 5  Thoughts of Suicide: Pt denies SI/HI  Will you contract for safety? Yes  Current AVH: Pt denies  Plan for Discharge/Comments: Pt attended discharge planning group and actively participated in group. CSW provided pt with today's workbook. Patient reports feeling "better" and would like to discharge today back to prior living situation or shelter. Patient agreeable to [facility] admissions screening on Thurdsday 4/28.  Transportation Means: Pt reports access to transportation by bus pass  Supports: No supports mentioned at this time  Tilden Fossa, MSW, [place] Social Worker [employer] [phone]

## 2014-12-31 NOTE — Progress Notes (Signed)
D: Patient is alert and oriented. Pt's mood and affect is depressed and flat/blunted. Pt denies SI/HI and AVH. Pt reports anxiety this morning. Pt is requesting to speak with LCSW in regards to discharge planning. Per night shift RN, Collie Siad pt is concerned about his wallet, Collie Siad called ED and spoke with charge nurse whom says pt's wallet was not listed on inventory sheet. Pt rates depression and hopelessness 3/10 and anxiety 6/10 which decreased with PRN medication. Pt complains of headache 7/10 this morning which decreased with PRN medication. Pt is requesting information on how to obtain a PCP upon discharge. A: Pt encouraged to attend unit groups to meet with LCSW for discussion about discharge. LCSW, Erasmo Downer made aware of pt's requests. Active listening by RN. Encouragement/Support provided to pt. Medication education reviewed with pt. PRN medication administered for anxiety and pain per providers orders (See MAR). Scheduled medications administered per providers orders (See MAR). 15 minute checks continued per protocol for patient safety.  R: Patient cooperative and receptive to nursing interventions. Pt remains safe.

## 2014-12-31 NOTE — BHH Suicide Risk Assessment (Signed)
[facility] Discharge Suicide Risk Assessment   Demographic Factors:  49 year old man, currently homeless   Total Time spent with patient: 30 minutes  Musculoskeletal: Strength & Muscle Tone: within normal limits Gait & Station: normal Patient leans: N/A  Psychiatric Specialty Exam: Physical Exam  ROS  Blood pressure 118/84, pulse 69, temperature 98.2 F (36.8 C), temperature source Oral, resp. rate 16, height 6\' 2"  (1.88 m), weight 204 lb (92.534 kg).Body mass index is 26.18 kg/(m^2).  General Appearance: improved grooming  Eye Contact::  Good  Speech:  Normal Rate409  Volume:  Normal  Mood:  reports improvement, denies feeling depressed at present  Affect:  Appropriate  Thought Process:  Goal Directed and Linear  Orientation:  Full (Time, Place, and Person)  Thought Content:  denies hallucinations, no delusions  Suicidal Thoughts:  No- at this time denies any thoughts of wanting to hurt self or anyone else   Homicidal Thoughts:  No  Memory:  recent and remote grossly intact   Judgement:  Improved   Insight:  Present  Psychomotor Activity:  Normal- no tremors or diaphoresis/restlessness noted at this time  Concentration:  Good  Recall:  Good  Fund of Knowledge:Good  Language: Good  Akathisia:  Negative  Handed:  Right  AIMS (if indicated):     Assets:  Desire for Improvement Resilience  Sleep:  Number of Hours: 4.5  Cognition: WNL  ADL's: ;improved   Have you used any form of tobacco in the last 30 days? (Cigarettes, Smokeless Tobacco, Cigars, and/or Pipes): Yes  Has this patient used any form of tobacco in the last 30 days? (Cigarettes, Smokeless Tobacco, Cigars, and/or Pipes) Yes, A prescription for an FDA-approved tobacco cessation medication was offered at discharge and the patient refused  Mental Status Per Nursing Assessment::   On Admission:  NA  Current Mental Status by Physician: At this time patient is improved compared to admission- improved grooming, improved  mood, at this time denying depression, affect more reactive, no thought disorder, no SI or HI, no psychotic symptoms , future oriented .  No current ongoing alcohol WDL symptoms and vitals are currently stable   Loss Factors: Homelessness, limited support network  Historical Factors: Alcohol dependence, depression  Risk Reduction Factors:   Positive coping skills or problem solving skills  Continued Clinical Symptoms:  As noted, at this time improved compared to admission and no current ongoing WDL symptoms noted .  Cognitive Features That Contribute To Risk:  Currently alert, attentive, oriented x 3    Suicide Risk:  Mild:  Suicidal ideation of limited frequency, intensity, duration, and specificity.  There are no identifiable plans, no associated intent, mild dysphoria and related symptoms, good self-control (both objective and subjective assessment), few other risk factors, and identifiable protective factors, including available and accessible social support.  Principal Problem: Alcohol dependence with alcohol-induced mood disorder Discharge Diagnoses:  Patient Active Problem List   Diagnosis Date Noted  . Alcohol dependence with alcohol-induced mood disorder [F10.24]   . Suicidal ideation [R45.851]   . GAD (generalized anxiety disorder) [F41.1] 09/18/2014  . Recurrent major depression-severe [F33.2] 09/18/2014  . Alcohol abuse with alcohol-induced mood disorder [F10.14] 09/17/2014  . Alcohol intoxication [F10.129]   . DVT, lower extremity [I82.409]   . Left leg DVT [I82.402] 06/06/2014  . Alcohol dependence [F10.20] 01/27/2014  . Upper leg DVT (deep venous thromboembolism), acute [I82.4Y9] 06/12/2013  . Homeless single person [Z59.0] 06/12/2013  . 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 Cellulitis of leg, left [L03.116] 01/03/2013  . Subtherapeutic international normalized ratio (INR) [R79.1]  12/18/2012  . Alcohol withdrawal [F10.239] 11/03/2012  . Major depressive disorder, recurrent episode, severe, without mention of psychotic behavior [F33.2] 11/03/2012  . Coumadin toxicity [T60.4X1A] 05/20/2012  . Hematuria [R31.9] 05/20/2012  . Deep vein thrombosis [I82.409] 04/13/2012  . DVT of lower limb, acute [I82.409] 03/28/2012  . 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 DVT of lower extremity, bilateral [I82.403] 01/28/2012  . Anemia [D64.9] 01/28/2012  . Degenerative joint disease [M19.90]   . Alcohol abuse [F10.10] 03/01/2011  . Tobacco abuse [Z72.0] 03/01/2011    Follow-up Information    Follow up with [facility] On 01/03/2015.   Why:  Screening for possible admission on Thursday April 28th at 8 am. Please bring your ID and insurance card. Call office if you need to reschedule.    Contact information:   [address],  [place], [place] [address] Phone: [phone] Fax: [phone]      Plan Of Care/Follow-up recommendations:  Activity:  as tolerated Diet:  REgular Tests:  NA Other:  See below  Is patient on multiple antipsychotic therapies at discharge:  No   Has Patient had three or more failed trials of antipsychotic monotherapy by history:  No  Recommended Plan for Multiple Antipsychotic Therapies: NA   Patient is leaving in good spirits. He plans to follow up as above. Encouraged to attend AA meetings regularly and states he is intending to participate in 12 step meetings. With his consent and ;in his presence , helped him make call to his PCP, [facility] Jimmye [facility]. Had not seen Dr. Jimmye Norman in a year or so. Staff there stated patient can come in without appointment/as walk in to re-institute care. Patient encouraged to follow up soon for ongoing medical management.    Andraya Frigon 12/31/2014, 1:51 PM

## 2014-12-31 NOTE — Progress Notes (Addendum)
Pt attended group on the 300 hall this evening.   Rick Duff, MHT

## 2014-12-31 NOTE — Progress Notes (Signed)
Recreation Therapy Notes  Date: 04.25.2016 Time: 9:30am Location: 300 Hall Dayroom   Group Topic: Stress Management  Goal Area(s) Addresses:  Patient will actively participate in stress management techniques presented during session.   Behavioral Response: Engaged, Appropriate   Intervention: Stress management techniques  Activity :  Deep Breathing and Guided Visualization. LRT provided instruction and demonstration on practice of Guided Visualization. Technique was coupled with deep breathing.   Education:  Stress Management, Discharge Planning.   Education Outcome: Acknowledges education  Clinical Observations/Feedback: Patient actively participated in presenting technique, patient displayed no difficulty and expressed ability to practice post d/c.    Laureen Ochs Jesalyn Finazzo, LRT/CTRS  Jamir Rone L 12/31/2014 1:56 PM

## 2014-12-31 NOTE — Progress Notes (Signed)
DISCHARGE NOTE: D: Patient was alert, oriented, in stable condition, and ambulatory with a steady gait upon discharge. Pt denies SI/HI and AVH. A: AVS reviewed and given to pt. Follow up reviewed with pt. Resources reviewed with pt, including [facility]. Prescriptions/Medications given to pt. Belongings returned to pt. Pt encouraged to follow up with PCP. Pt given time to ask questions and express concerns. R: Pt D/C'd with bus pass and pt given directions to nearest bus stop.

## 2014-12-31 NOTE — Progress Notes (Signed)
  [facility] Adult Case Management Discharge Plan :  Will you be returning to the same living situation after discharge:  Yes,  Patient plans to return home At discharge, do you have transportation home?: Yes,  patient provided with bus passes Do you have the ability to pay for your medications: Yes,  patient will be provided with medication samples and prescriptions  Release of information consent forms completed and in the chart;  Patient's signature needed at discharge.  Patient to Follow up at: Follow-up Information    Follow up with [facility] On 01/03/2015.   Why:  Screening for possible admission on Thursday April 28th at 8 am. Please bring your ID and insurance card. Call office if you need to reschedule.    Contact information:   [address],  [place], [place] [address] Phone: [phone] Fax: [phone]      Patient denies SI/HI: Yes,  denies    Safety Planning and Suicide Prevention discussed: Yes,  with patient  Have you used any form of tobacco in the last 30 days? (Cigarettes, Smokeless Tobacco, Cigars, and/or Pipes): Yes  Has patient been referred to the Quitline?: Patient refused referral  Aubery Date, Casimiro Needle 12/31/2014, 1:21 PM

## 2015-01-02 NOTE — Progress Notes (Signed)
Patient Discharge Instructions:  After Visit Summary (AVS):   Faxed to:  01/02/15 Discharge Summary Note:   Faxed to:  01/02/15 Psychiatric Admission Assessment Note:   Faxed to:  01/02/15 Suicide Risk Assessment - Discharge Assessment:   Faxed to:  01/02/15 Faxed/Sent to the Next Level Care provider:  01/02/15 Faxed to [facility], 01/02/2015, 4:03 PM

## 2015-02-08 ENCOUNTER — Encounter (HOSPITAL_COMMUNITY): Payer: Self-pay | Admitting: Emergency Medicine

## 2015-02-08 ENCOUNTER — Emergency Department (HOSPITAL_BASED_OUTPATIENT_CLINIC_OR_DEPARTMENT_OTHER): Payer: Self-pay

## 2015-02-08 ENCOUNTER — Emergency Department (HOSPITAL_COMMUNITY)
Admission: EM | Admit: 2015-02-08 | Discharge: 2015-02-09 | Disposition: A | Discharge status: Other Acute Inpatient Hospital | Payer: Self-pay | Attending: Emergency Medicine | Admitting: Emergency Medicine

## 2015-02-08 DIAGNOSIS — M79662 Pain in left lower leg: Secondary | ICD-10-CM | POA: Insufficient documentation

## 2015-02-08 DIAGNOSIS — I2510 Atherosclerotic heart disease of native coronary artery without angina pectoris: Secondary | ICD-10-CM | POA: Insufficient documentation

## 2015-02-08 DIAGNOSIS — M79609 Pain in unspecified limb: Secondary | ICD-10-CM

## 2015-02-08 DIAGNOSIS — F1314 Sedative, hypnotic or anxiolytic abuse with sedative, hypnotic or anxiolytic-induced mood disorder: Secondary | ICD-10-CM | POA: Insufficient documentation

## 2015-02-08 DIAGNOSIS — F1994 Other psychoactive substance use, unspecified with psychoactive substance-induced mood disorder: Secondary | ICD-10-CM

## 2015-02-08 DIAGNOSIS — Z8619 Personal history of other infectious and parasitic diseases: Secondary | ICD-10-CM | POA: Insufficient documentation

## 2015-02-08 DIAGNOSIS — Z86718 Personal history of other venous thrombosis and embolism: Secondary | ICD-10-CM | POA: Insufficient documentation

## 2015-02-08 DIAGNOSIS — F10920 Alcohol use, unspecified with intoxication, uncomplicated: Secondary | ICD-10-CM

## 2015-02-08 DIAGNOSIS — F329 Major depressive disorder, single episode, unspecified: Secondary | ICD-10-CM | POA: Insufficient documentation

## 2015-02-08 DIAGNOSIS — Z86711 Personal history of pulmonary embolism: Secondary | ICD-10-CM | POA: Insufficient documentation

## 2015-02-08 DIAGNOSIS — F1014 Alcohol abuse with alcohol-induced mood disorder: Secondary | ICD-10-CM | POA: Insufficient documentation

## 2015-02-08 DIAGNOSIS — F10230 Alcohol dependence with withdrawal, uncomplicated: Secondary | ICD-10-CM | POA: Diagnosis present

## 2015-02-08 DIAGNOSIS — Z720 Tobacco use: Secondary | ICD-10-CM | POA: Insufficient documentation

## 2015-02-08 DIAGNOSIS — F99 Mental disorder, not otherwise specified: Secondary | ICD-10-CM | POA: Insufficient documentation

## 2015-02-08 DIAGNOSIS — R45851 Suicidal ideations: Secondary | ICD-10-CM

## 2015-02-08 DIAGNOSIS — M7989 Other specified soft tissue disorders: Secondary | ICD-10-CM

## 2015-02-08 DIAGNOSIS — Z79899 Other long term (current) drug therapy: Secondary | ICD-10-CM | POA: Insufficient documentation

## 2015-02-08 LAB — CBC WITH DIFFERENTIAL/PLATELET
BASOS ABS: 0.1 10*3/uL (ref 0.0–0.1)
BASOS PCT: 2 % — AB (ref 0–1)
EOS PCT: 6 % — AB (ref 0–5)
Eosinophils Absolute: 0.3 10*3/uL (ref 0.0–0.7)
HCT: 39.8 % (ref 39.0–52.0)
Hemoglobin: 13.1 g/dL (ref 13.0–17.0)
LYMPHS ABS: 2 10*3/uL (ref 0.7–4.0)
LYMPHS PCT: 35 % (ref 12–46)
MCH: 34.3 pg — ABNORMAL HIGH (ref 26.0–34.0)
MCHC: 32.9 g/dL (ref 30.0–36.0)
MCV: 104.2 fL — AB (ref 78.0–100.0)
MONOS PCT: 10 % (ref 3–12)
Monocytes Absolute: 0.6 10*3/uL (ref 0.1–1.0)
NEUTROS ABS: 2.7 10*3/uL (ref 1.7–7.7)
Neutrophils Relative %: 47 % (ref 43–77)
PLATELETS: 258 10*3/uL (ref 150–400)
RBC: 3.82 MIL/uL — ABNORMAL LOW (ref 4.22–5.81)
RDW: 16.6 % — ABNORMAL HIGH (ref 11.5–15.5)
WBC: 5.6 10*3/uL (ref 4.0–10.5)

## 2015-02-08 LAB — ETHANOL: ALCOHOL ETHYL (B): 312 mg/dL — AB (ref <5)

## 2015-02-08 LAB — COMPREHENSIVE METABOLIC PANEL
ALBUMIN: 3.5 g/dL (ref 3.5–5.0)
ALK PHOS: 80 U/L (ref 38–126)
ALT: 32 U/L (ref 17–63)
AST: 35 U/L (ref 15–41)
Anion gap: 7 (ref 5–15)
BILIRUBIN TOTAL: 0.4 mg/dL (ref 0.3–1.2)
BUN: 8 mg/dL (ref 6–20)
CO2: 25 mmol/L (ref 22–32)
Calcium: 8.1 mg/dL — ABNORMAL LOW (ref 8.9–10.3)
Chloride: 110 mmol/L (ref 101–111)
Creatinine, Ser: 0.81 mg/dL (ref 0.61–1.24)
GFR calc Af Amer: >60 mL/min (ref >60)
GFR calc non Af Amer: >60 mL/min (ref >60)
GLUCOSE: 87 mg/dL (ref 65–99)
POTASSIUM: 3.6 mmol/L (ref 3.5–5.1)
Sodium: 142 mmol/L (ref 135–145)
Total Protein: 6.6 g/dL (ref 6.5–8.1)

## 2015-02-08 LAB — ACETAMINOPHEN LEVEL: Acetaminophen (Tylenol), Serum: <10 ug/mL — ABNORMAL LOW (ref 10–30)

## 2015-02-08 LAB — PROTIME-INR
INR: 1.05 (ref 0.00–1.49)
Prothrombin Time: 13.9 seconds (ref 11.6–15.2)

## 2015-02-08 LAB — SALICYLATE LEVEL: Salicylate Lvl: <4 mg/dL (ref 2.8–30.0)

## 2015-02-08 MED ORDER — ACAMPROSATE CALCIUM 333 MG PO TBEC
666.0000 mg | DELAYED_RELEASE_TABLET | Freq: Three times a day (TID) | ORAL | Status: DC
  Administered 2015-02-08 – 2015-02-09 (×5): 666 mg via ORAL
  Filled 2015-02-08 (×6): qty 2

## 2015-02-08 MED ORDER — CITALOPRAM HYDROBROMIDE 20 MG PO TABS
20.0000 mg | ORAL_TABLET | Freq: Every day | ORAL | Status: DC
  Administered 2015-02-08 – 2015-02-09 (×2): 20 mg via ORAL
  Filled 2015-02-08 (×3): qty 1

## 2015-02-08 MED ORDER — ZIPRASIDONE MESYLATE 20 MG IM SOLR: 20.0000 mg | Freq: Once | INTRAMUSCULAR | Status: DC

## 2015-02-08 MED ORDER — LORAZEPAM 1 MG PO TABS
0.0000 mg | ORAL_TABLET | Freq: Four times a day (QID) | ORAL | Status: DC
  Administered 2015-02-08 – 2015-02-09 (×4): 1 mg via ORAL
  Filled 2015-02-08 (×4): qty 1

## 2015-02-08 MED ORDER — TRAZODONE HCL 100 MG PO TABS: 200.0000 mg | ORAL_TABLET | Freq: Every evening | ORAL | Status: DC | PRN

## 2015-02-08 MED ORDER — LORAZEPAM 1 MG PO TABS
1.0000 mg | ORAL_TABLET | Freq: Once | ORAL | Status: AC
  Administered 2015-02-08: 1 mg via ORAL
  Filled 2015-02-08: qty 1

## 2015-02-08 MED ORDER — VITAMIN B-1 100 MG PO TABS
100.0000 mg | ORAL_TABLET | Freq: Every day | ORAL | Status: DC
  Administered 2015-02-08 – 2015-02-09 (×2): 100 mg via ORAL
  Filled 2015-02-08 (×2): qty 1

## 2015-02-08 MED ORDER — ADULT MULTIVITAMIN W/MINERALS CH
1.0000 | ORAL_TABLET | Freq: Every day | ORAL | Status: DC
Start: 2015-02-08 — End: 2015-02-09
  Administered 2015-02-08 – 2015-02-09 (×2): 1 via ORAL
  Filled 2015-02-08 (×3): qty 1

## 2015-02-08 MED ORDER — RIVAROXABAN 20 MG PO TABS
20.0000 mg | ORAL_TABLET | Freq: Every day | ORAL | Status: DC
  Administered 2015-02-08 – 2015-02-09 (×3): 20 mg via ORAL
  Filled 2015-02-08 (×3): qty 1

## 2015-02-08 MED ORDER — LORAZEPAM 2 MG/ML IJ SOLN
1.0000 mg | Freq: Once | INTRAMUSCULAR | Status: AC
  Administered 2015-02-08: 1 mg via INTRAMUSCULAR
  Filled 2015-02-08: qty 1

## 2015-02-08 MED ORDER — RIVAROXABAN 10 MG PO TABS: 10.0000 mg | ORAL_TABLET | Freq: Every day | ORAL | Status: DC

## 2015-02-08 MED ORDER — LORAZEPAM 1 MG PO TABS: 0.0000 mg | ORAL_TABLET | Freq: Two times a day (BID) | ORAL | Status: DC

## 2015-02-08 MED ORDER — HYDROXYZINE HCL 25 MG PO TABS: 25.0000 mg | ORAL_TABLET | Freq: Four times a day (QID) | ORAL | Status: DC | PRN

## 2015-02-08 MED ORDER — NICOTINE 21 MG/24HR TD PT24
21.0000 mg | MEDICATED_PATCH | Freq: Every day | TRANSDERMAL | Status: DC
  Administered 2015-02-08 – 2015-02-09 (×2): 21 mg via TRANSDERMAL
  Filled 2015-02-08 (×2): qty 1

## 2015-02-08 NOTE — BH Assessment (Signed)
[facility] Assessment Progress Note  The following facilities have been contacted to seek placement for this patient with results as noted:  Beds available, information sent, decision pending:  [place]:  [facility] [place] Triage Specialist [phone]

## 2015-02-08 NOTE — BH Assessment (Signed)
Consulted with Dr. Darleene Cleaver who states that patient meets inpatient criteria at this time. Dr. Darleene Cleaver recommends patient be considered for a 300 hall bed.

## 2015-02-08 NOTE — ED Notes (Signed)
Therapeutic conversation used to reassure pt home medications will be given once tubed from pharmacy. Pt cooperative.

## 2015-02-08 NOTE — ED Notes (Signed)
Patient cooperative, calmer.  Patient notified that when his home meds arrive, they will be given.

## 2015-02-08 NOTE — ED Notes (Signed)
Report received from [facility]. Pt. Sleeping, respirations regular and unlabored. Will continue to monitor for safety via security cameras and Q 15 minute checks.

## 2015-02-08 NOTE — BHH Counselor (Signed)
Pt. Declined at [facility], per Dr. Jerilee Hoh, "Vauge Suicidal Ideations. Issues are more social not psychiatric. He won't benefit form admission to psychiatric unit."

## 2015-02-08 NOTE — ED Notes (Signed)
Pt reports bilateral leg pain d/t blood clots for the last four years and has been kicked off medicaid and can not afford his medication xaralto.Marland Kitchen

## 2015-02-08 NOTE — ED Notes (Signed)
Pt admits to drinking a least 4 beers today. Also states he has 4 xaralto pills left and is trying to stretch them to make the last.

## 2015-02-08 NOTE — ED Notes (Signed)
Pt verbalized thoughts of self injury if he doesn't get treated for bilateral LE thrombus verbalized that he has a knife in his book bag and will use it to kill himself. Pt also stated that he wouldtake all his medications and kill self as well.

## 2015-02-08 NOTE — ED Notes (Signed)
Pt. Noted sleeping in room. No complaints or concerns voiced. No distress or abnormal behavior noted. Will continue to monitor with security cameras. Q 15 minute rounds continue. 

## 2015-02-08 NOTE — ED Provider Notes (Signed)
Discussed case with Delos Haring, PA-C. Transfer of care to this provider at change in shift.   Plan: Doppler in the morning, compare to previous possible admission for no medications x 3 weeks. If okay, SI - will need psych eval.   [name] is a 49 year old male past medical history of coronary artery disease, lupus, DVT, hepatitis B mental disorder presenting to the ED with left leg pain. Patient does have history of PE and DVT, has not been taking Xarelto in approximately 3 weeks secondary to having Medicaid discontinued. Patient also reported while being brought in by EMS that he is suicidal - will use medication or knife.  VASCULAR LAB PRELIMINARY PRELIMINARY PRELIMINARY PRELIMINARY  Left lower extremity venous duplex completed.   Preliminary report: Left: No evidence of DVT, superficial thrombosis, or Baker's cyst.  Cestone,Helene, RVT 02/08/2015, 9:19 AM  Results for orders placed or performed during the hospital encounter of 02/08/15  CBC with Differential/Platelet  Result Value Ref Range   WBC 5.6 4.0 - 10.5 K/uL   RBC 3.82 (L) 4.22 - 5.81 MIL/uL   Hemoglobin 13.1 13.0 - 17.0 g/dL   HCT 39.8 39.0 - 52.0 %   MCV 104.2 (H) 78.0 - 100.0 fL   MCH 34.3 (H) 26.0 - 34.0 pg   MCHC 32.9 30.0 - 36.0 g/dL   RDW 16.6 (H) 11.5 - 15.5 %   Platelets 258 150 - 400 K/uL   Neutrophils Relative % 47 43 - 77 %   Neutro Abs 2.7 1.7 - 7.7 K/uL   Lymphocytes Relative 35 12 - 46 %   Lymphs Abs 2.0 0.7 - 4.0 K/uL   Monocytes Relative 10 3 - 12 %   Monocytes Absolute 0.6 0.1 - 1.0 K/uL   Eosinophils Relative 6 (H) 0 - 5 %   Eosinophils Absolute 0.3 0.0 - 0.7 K/uL   Basophils Relative 2 (H) 0 - 1 %   Basophils Absolute 0.1 0.0 - 0.1 K/uL  Comprehensive metabolic panel  Result Value Ref Range   Sodium 142 135 - 145 mmol/L   Potassium 3.6 3.5 - 5.1 mmol/L   Chloride 110 101 - 111 mmol/L   CO2 25 22 - 32 mmol/L   Glucose, Bld 87 65 - 99 mg/dL   BUN 8 6 - 20 mg/dL   Creatinine, Ser  0.81 0.61 - 1.24 mg/dL   Calcium 8.1 (L) 8.9 - 10.3 mg/dL   Total Protein 6.6 6.5 - 8.1 g/dL   Albumin 3.5 3.5 - 5.0 g/dL   AST 35 15 - 41 U/L   ALT 32 17 - 63 U/L   Alkaline Phosphatase 80 38 - 126 U/L   Total Bilirubin 0.4 0.3 - 1.2 mg/dL   GFR calc non Af Amer >60 >60 mL/min   GFR calc Af Amer >60 >60 mL/min   Anion gap 7 5 - 15  Ethanol  Result Value Ref Range   Alcohol, Ethyl (B) 312 (HH) <5 mg/dL  Salicylate level  Result Value Ref Range   Salicylate Lvl <9.6 2.8 - 30.0 mg/dL  Acetaminophen level  Result Value Ref Range   Acetaminophen (Tylenol), Serum <10 (L) 10 - 30 ug/mL  Protime-INR  Result Value Ref Range   Prothrombin Time 13.9 11.6 - 15.2 seconds   INR 1.05 0.00 - 1.49   Medications  rivaroxaban (XARELTO) tablet 20 mg (20 mg Oral Given 02/08/15 0330)  ziprasidone (GEODON) injection 20 mg (not administered)  LORazepam (ATIVAN) injection 1 mg (1 mg Intramuscular Given  02/08/15 0235)    Filed Vitals:   02/08/15 0400 02/08/15 0624 02/08/15 0723 02/08/15 0846  BP:  113/76 112/86 98/71  Pulse: 86 75 82 85  Temp:      TempSrc:      Resp: 15 11 12 12   Weight:      SpO2: 100% 100% 95% 96%   CBC negative elevated leukocytosis. Hemoglobin 13.1, hematocrit 39.8. CMP unremarkable. Ethanol elevated at 312. Salicylate and acetaminophen negative elevation. PT/INR within normal limits. Patient has a long history, cardiac issues as well as PE/DVT-currently not taking Xarelto. Doses were also given here in the ED setting. Doppler of left lower extremity negative for acute DVT, SVT or Baker's cyst.  9:21 AM Dr. Alvino Chapel at bedside, patient getting angry. IVC paperwork performed. Patient reported that he is going to go into his bag and take out his knife to stab himself. IVC paperwork performed.   Patient agitated in the ED, IVC paperwork placed. Negative tachycardia, tachypnea, hypoxia - negative acute DVT noted on [place]. Patient started back on Xarelto. Discussed case with Dr.  Alvino Chapel, does not need to go any further in the work-up.   10:09 AM This provider checked up on the patient. Alert and oriented. Denied chest pain, shortness of breath, difficulty breathing, hemoptysis, cough. Patient reported that he does drink alcohol daily and stated that he has been drinking since he was 49 years of age. Patient reported that he has been smoking cigarettes as well. Discussed with attending physician, Dr. Delane Ginger. Alvino Chapel - reported that patient can be moved to psych ED. Xarelto order has been placed. CIWA protocol placed due to patient having history of alcohol abuse. Psych holding orders placed.   Jamse Mead, PA-C 02/08/15 [address], PA-C 02/08/15 1117  Davonna Belling, MD 02/08/15 1547

## 2015-02-08 NOTE — ED Notes (Addendum)
Follow up Vascular should arrive around 9-9:15

## 2015-02-08 NOTE — ED Provider Notes (Signed)
CSN: [id number]     Arrival date & time 02/08/15  0117 History   First MD Initiated Contact with Patient 02/08/15 0145     Chief Complaint  Patient presents with  . Leg Pain     (Consider location/radiation/quality/duration/timing/severity/associated sxs/prior Treatment) HPI    PCP: [facility] Blood pressure 124/78, pulse 95, temperature 98.1 F (36.7 C), temperature source Oral, resp. rate 18, weight 205 lb (92.987 kg), SpO2 97 %.  Mostyn Varnell is a 49 y.o.male with a significant PMH of artery disease, PE, Lupus, alcohol abuse, DVT, hepatitis B, mental disorder and depression presents to the ER by EMS with complaints of suicidal ideation, left lower leg pain, inability to take his blood thinner. The patient has recently been kicked off of his Medicaid "permanintely" for unknown reasons. Because of this he has been unable to take a Xarelto over the last couple weeks. Inability to take his medications has been believing that he is most likely going to die. He feels that if he is going to die he might as well do it sooner and has a knife in his backpack here in the ED. he also has a bag full of medications and has threatened to stab himself and overdosed on medications and attempt to harm himself. He then tells me that he does not want to hurt himself or to die but that he is very unpredictable.   Past Medical History  Diagnosis Date  . Coronary artery disease   . Degenerative joint disease of spine   . Pulmonary embolism 06/2010  . Degenerative joint disease   . 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 ETOH abuse   . DVT (deep venous thrombosis)   . Hepatitis B   . Mental disorder   . Depression    Past Surgical History  Procedure Laterality Date  . Left elbow surgery    . Tonsillectomy     Family History  Problem Relation Age of Onset  . Cancer Mother     Ovarian   History  Substance Use Topics  . Smoking status: Current Every Day Smoker -- 2.00  packs/day for 30 years    Types: Cigarettes  . Smokeless tobacco: Never Used  . Alcohol Use: 14.4 oz/week    24 Cans of beer per week     Comment: cas of beer daily    Review of Systems  10 Systems reviewed and are negative for acute change except as noted in the HPI.    Allergies  Review of patient's allergies indicates no known allergies.  Home Medications   Prior to Admission medications   Medication Sig Start Date End Date Taking? Authorizing Provider  acamprosate (CAMPRAL) 333 MG tablet Take 2 tablets (666 mg total) by mouth 3 (three) times daily with meals. 12/31/14  Yes Niel Hummer, NP  citalopram (CELEXA) 20 MG tablet Take 1 tablet (20 mg total) by mouth daily. 12/31/14  Yes Niel Hummer, NP  hydrOXYzine (ATARAX/VISTARIL) 25 MG tablet Take 1 tablet (25 mg total) by mouth every 6 (six) hours as needed for anxiety. 12/31/14  Yes Niel Hummer, NP  Multiple Vitamin (MULTIVITAMIN WITH MINERALS) TABS tablet Take 1 tablet by mouth daily. 12/31/14  Yes Niel Hummer, NP  nicotine (NICODERM CQ - DOSED IN MG/24 HOURS) 21 mg/24hr patch Place 1 patch (21 mg total) onto the skin daily at 6 (six) AM. For nicotine addiction 12/31/14  Yes Niel Hummer, NP  rivaroxaban Alveda Reasons) 20  MG TABS tablet Take 1 tablet (20 mg total) by mouth daily with supper. (Start on 10-09-14 @ 8 am): For prevention of blood clot 12/31/14  Yes Niel Hummer, NP  traZODone (DESYREL) 100 MG tablet Take 2 tablets (200 mg total) by mouth at bedtime as needed for sleep. 12/31/14  Yes Niel Hummer, NP   BP 124/78 mmHg  Pulse 95  Temp(Src) 98.1 F (36.7 C) (Oral)  Resp 18  Wt 205 lb (92.987 kg)  SpO2 97% Physical Exam  Constitutional: He appears well-developed and well-nourished. No distress.  HENT:  Head: Normocephalic and atraumatic.  Eyes: Pupils are equal, round, and reactive to light.  Neck: Normal range of motion. Neck supple.  Cardiovascular: Normal rate and regular rhythm.   Pulmonary/Chest: Effort normal  and breath sounds normal. He has no decreased breath sounds. He has no wheezes. He has no rhonchi.  Abdominal: Soft.  Musculoskeletal:       Left lower leg: He exhibits tenderness (pedal pulses are palpable, positive venous stasis.), swelling and edema. He exhibits no bony tenderness, no deformity and no laceration.  Neurological: He is alert.  Skin: Skin is warm and dry.  Nursing note and vitals reviewed.   ED Course  Procedures (including critical care time) Labs Review Labs Reviewed  CBC WITH DIFFERENTIAL/PLATELET - Abnormal; Notable for the following:    RBC 3.82 (*)    MCV 104.2 (*)    MCH 34.3 (*)    RDW 16.6 (*)    Eosinophils Relative 6 (*)    Basophils Relative 2 (*)    All other components within normal limits  COMPREHENSIVE METABOLIC PANEL - Abnormal; Notable for the following:    Calcium 8.1 (*)    All other components within normal limits  ETHANOL - Abnormal; Notable for the following:    Alcohol, Ethyl (B) 312 (*)    All other components within normal limits  ACETAMINOPHEN LEVEL - Abnormal; Notable for the following:    Acetaminophen (Tylenol), Serum <10 (*)    All other components within normal limits  SALICYLATE LEVEL  PROTIME-INR  URINE RAPID DRUG SCREEN (HOSP PERFORMED) NOT AT [facility]    Imaging Review No results found.   EKG Interpretation None      MDM   Final diagnoses:  Suicidal ideation  Alcohol dependence with alcohol-induced mood disorder  Alcohol intoxication, uncomplicated    Medications  rivaroxaban (XARELTO) tablet 20 mg (20 mg Oral Given 02/08/15 0330)  LORazepam (ATIVAN) injection 1 mg (1 mg Intramuscular Given 02/08/15 0235)    Patient has multiple issues during today's visit. His chronic medical conditions for which she is unable to take his medications. He has known DVT and history of PE but has not been on blood thinners. He is having worsening left leg pain. He will need a ultrasound of the left leg in the morning to further  evaluate DVT. His been given a dose of his blood thinner here in the ER an injection of Ativan for irritability. The patient will need to be medically cleared and when he is medically cleared then will need a psychiatric evaluation. Patient has knife with him in his bag, reports being unpredictable and having a mental disorder and a front of myself and staff has threatened to stab himself.   Patients labs show him to be intoxicated with an ETOH at 312, otherwise his labs are normal. He will need dopplers in the morning. Once medically cleared TTS and social work consult recommended. At  end of shift patient handed off to [employer]  Filed Vitals:   02/08/15 0123  BP: 124/78  Pulse: 95  Temp: 98.1 F (36.7 C)  Resp: [address], PA-C 02/08/15 3735  Merryl Hacker, MD 02/08/15 [phone]

## 2015-02-08 NOTE — ED Notes (Signed)
Pt. Noted in rest room. No complaints or concerns voiced. No distress or abnormal behavior noted. Will continue to monitor with security cameras. Q 15 minute rounds continue.  
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
## 2015-02-08 NOTE — Progress Notes (Signed)
VASCULAR LAB PRELIMINARY  PRELIMINARY  PRELIMINARY  PRELIMINARY  Left lower extremity venous duplex completed.    Preliminary report:  Left:  No evidence of DVT, superficial thrombosis, or Baker's cyst.  Tiffanie Blassingame, RVT 02/08/2015, 9:19 AM

## 2015-02-08 NOTE — ED Notes (Signed)
Vascular technician at bedside.

## 2015-02-08 NOTE — ED Notes (Signed)
Pt sleeping no pain noted pt currently in hospital gown only.

## 2015-02-08 NOTE — BHH Counselor (Signed)
Pending review with [place] for possible placement.

## 2015-02-08 NOTE — ED Notes (Signed)
RN asked MD for order to cath.  MD stated not to cath.

## 2015-02-08 NOTE — ED Notes (Signed)
Patient has two bags of belongings in locker 30.

## 2015-02-08 NOTE — ED Notes (Signed)
Bed: UV22 Expected date:  Expected time:  Means of arrival:  Comments: EMS 49 yo male aggressive with EMS, wants evaluation for DVT

## 2015-02-08 NOTE — ED Notes (Addendum)
Per Marin Olp pt request belongings; see previous SI ideation comment charted by previous nurse. Pt denied belongings for safety; pt agitated. Pt continues to reports wants his home medications from bag; staff report hospital will provide all home medications. [place] MD aware.  Pt status IVC.

## 2015-02-08 NOTE — ED Provider Notes (Signed)
  Physical Exam  BP 98/71 mmHg  Pulse 85  Temp(Src) 98.1 F (36.7 C) (Oral)  Resp 12  Wt 205 lb (92.987 kg)  SpO2 96%  Physical Exam  ED Course  Procedures  MDM Patient became more belligerent. He had stated that he was going to cut himself. Has been involuntarily committed.      Davonna Belling, MD 02/08/15 0930

## 2015-02-09 ENCOUNTER — Encounter (HOSPITAL_COMMUNITY): Payer: Self-pay | Admitting: *Deleted

## 2015-02-09 ENCOUNTER — Inpatient Hospital Stay (HOSPITAL_COMMUNITY)
Admission: AD | Admit: 2015-02-09 | Discharge: 2015-02-15 | DRG: 897 | Disposition: A | Discharge status: Home / Self Care | Payer: Federal, State, Local not specified - Other | Source: Intra-hospital | Attending: Psychiatry | Admitting: Psychiatry

## 2015-02-09 DIAGNOSIS — B86 Scabies: Secondary | ICD-10-CM | POA: Diagnosis present

## 2015-02-09 DIAGNOSIS — Z86718 Personal history of other venous thrombosis and embolism: Secondary | ICD-10-CM | POA: Diagnosis not present

## 2015-02-09 DIAGNOSIS — F17210 Nicotine dependence, cigarettes, uncomplicated: Secondary | ICD-10-CM | POA: Diagnosis present

## 2015-02-09 DIAGNOSIS — F1020 Alcohol dependence, uncomplicated: Secondary | ICD-10-CM | POA: Diagnosis not present

## 2015-02-09 DIAGNOSIS — F1024 Alcohol dependence with alcohol-induced mood disorder: Secondary | ICD-10-CM | POA: Diagnosis not present

## 2015-02-09 DIAGNOSIS — R45851 Suicidal ideations: Secondary | ICD-10-CM | POA: Diagnosis present

## 2015-02-09 DIAGNOSIS — Y908 Blood alcohol level of 240 mg/100 ml or more: Secondary | ICD-10-CM | POA: Diagnosis present

## 2015-02-09 DIAGNOSIS — Z590 Homelessness: Secondary | ICD-10-CM | POA: Diagnosis not present

## 2015-02-09 DIAGNOSIS — F1994 Other psychoactive substance use, unspecified with psychoactive substance-induced mood disorder: Secondary | ICD-10-CM | POA: Diagnosis present

## 2015-02-09 DIAGNOSIS — Z86711 Personal history of pulmonary embolism: Secondary | ICD-10-CM

## 2015-02-09 DIAGNOSIS — G4700 Insomnia, unspecified: Secondary | ICD-10-CM | POA: Diagnosis present

## 2015-02-09 DIAGNOSIS — F411 Generalized anxiety disorder: Secondary | ICD-10-CM | POA: Diagnosis present

## 2015-02-09 DIAGNOSIS — F332 Major depressive disorder, recurrent severe without psychotic features: Secondary | ICD-10-CM | POA: Diagnosis present

## 2015-02-09 DIAGNOSIS — I2510 Atherosclerotic heart disease of native coronary artery without angina pectoris: Secondary | ICD-10-CM | POA: Diagnosis present

## 2015-02-09 DIAGNOSIS — F10239 Alcohol dependence with withdrawal, unspecified: Secondary | ICD-10-CM | POA: Diagnosis present

## 2015-02-09 DIAGNOSIS — F10129 Alcohol abuse with intoxication, unspecified: Secondary | ICD-10-CM | POA: Diagnosis present

## 2015-02-09 DIAGNOSIS — W57XXXA Bitten or stung by nonvenomous insect and other nonvenomous arthropods, initial encounter: Secondary | ICD-10-CM | POA: Diagnosis present

## 2015-02-09 DIAGNOSIS — F10230 Alcohol dependence with withdrawal, uncomplicated: Secondary | ICD-10-CM

## 2015-02-09 LAB — RAPID URINE DRUG SCREEN, HOSP PERFORMED
Amphetamines: NOT DETECTED
BENZODIAZEPINES: POSITIVE — AB
Barbiturates: NOT DETECTED
Cocaine: NOT DETECTED
OPIATES: NOT DETECTED
TETRAHYDROCANNABINOL: NOT DETECTED

## 2015-02-09 MED ORDER — ACAMPROSATE CALCIUM 333 MG PO TBEC
666.0000 mg | DELAYED_RELEASE_TABLET | Freq: Three times a day (TID) | ORAL | Status: DC
  Administered 2015-02-10 – 2015-02-15 (×17): 666 mg via ORAL
  Filled 2015-02-09 (×22): qty 2

## 2015-02-09 MED ORDER — LORAZEPAM 1 MG PO TABS: 0.0000 mg | ORAL_TABLET | Freq: Four times a day (QID) | ORAL | Status: DC

## 2015-02-09 MED ORDER — ADULT MULTIVITAMIN W/MINERALS CH
1.0000 | ORAL_TABLET | Freq: Every day | ORAL | Status: DC
  Administered 2015-02-10 – 2015-02-15 (×6): 1 via ORAL
  Filled 2015-02-09 (×8): qty 1

## 2015-02-09 MED ORDER — NICOTINE 21 MG/24HR TD PT24
21.0000 mg | MEDICATED_PATCH | Freq: Every day | TRANSDERMAL | Status: DC
  Administered 2015-02-10 – 2015-02-15 (×6): 21 mg via TRANSDERMAL
  Filled 2015-02-09 (×9): qty 1

## 2015-02-09 MED ORDER — MAGNESIUM HYDROXIDE 400 MG/5ML PO SUSP: 30.0000 mL | Freq: Every day | ORAL | Status: DC | PRN

## 2015-02-09 MED ORDER — RIVAROXABAN 20 MG PO TABS
20.0000 mg | ORAL_TABLET | Freq: Every day | ORAL | Status: DC
  Administered 2015-02-10 – 2015-02-14 (×5): 20 mg via ORAL
  Filled 2015-02-09 (×4): qty 1
  Filled 2015-02-09: qty 7
  Filled 2015-02-09 (×2): qty 1

## 2015-02-09 MED ORDER — TRAZODONE HCL 100 MG PO TABS
200.0000 mg | ORAL_TABLET | Freq: Every evening | ORAL | Status: DC | PRN
  Administered 2015-02-09 – 2015-02-12 (×4): 200 mg via ORAL
  Filled 2015-02-09 (×4): qty 2

## 2015-02-09 MED ORDER — ALUM & MAG HYDROXIDE-SIMETH 200-200-20 MG/5ML PO SUSP: 30.0000 mL | ORAL | Status: DC | PRN

## 2015-02-09 MED ORDER — VITAMIN B-1 100 MG PO TABS
100.0000 mg | ORAL_TABLET | Freq: Every day | ORAL | Status: DC
  Administered 2015-02-10 – 2015-02-15 (×6): 100 mg via ORAL
  Filled 2015-02-09 (×9): qty 1

## 2015-02-09 MED ORDER — ACETAMINOPHEN 325 MG PO TABS
650.0000 mg | ORAL_TABLET | Freq: Four times a day (QID) | ORAL | Status: DC | PRN
  Administered 2015-02-11 – 2015-02-15 (×5): 650 mg via ORAL
  Filled 2015-02-09 (×5): qty 2

## 2015-02-09 MED ORDER — ZIPRASIDONE MESYLATE 20 MG IM SOLR
20.0000 mg | Freq: Once | INTRAMUSCULAR | Status: DC
  Filled 2015-02-09: qty 20

## 2015-02-09 MED ORDER — CITALOPRAM HYDROBROMIDE 20 MG PO TABS
20.0000 mg | ORAL_TABLET | Freq: Every day | ORAL | Status: DC
  Administered 2015-02-10 – 2015-02-15 (×6): 20 mg via ORAL
  Filled 2015-02-09 (×5): qty 1
  Filled 2015-02-09: qty 14
  Filled 2015-02-09 (×3): qty 1

## 2015-02-09 MED ORDER — HYDROXYZINE HCL 25 MG PO TABS
25.0000 mg | ORAL_TABLET | Freq: Four times a day (QID) | ORAL | Status: DC | PRN
  Administered 2015-02-09 – 2015-02-11 (×4): 25 mg via ORAL
  Filled 2015-02-09 (×5): qty 1

## 2015-02-09 MED ORDER — LORAZEPAM 1 MG PO TABS
1.0000 mg | ORAL_TABLET | Freq: Once | ORAL | Status: AC
  Administered 2015-02-09: 1 mg via ORAL
  Filled 2015-02-09: qty 1

## 2015-02-09 NOTE — ED Notes (Signed)
Pt. Noted sleeping in room. No complaints or concerns voiced. No distress or abnormal behavior noted. Will continue to monitor with security cameras. Q 15 minute rounds continue. 

## 2015-02-09 NOTE — BHH Counselor (Addendum)
[name] Assessment Progress Note  Pt has been accepted to [facility] room 405-1, per Claiborne Billings, [facility]. Pt can come after 1930. He is under IVC. Pt's nurse, Narda Rutherford, has been made aware.   Kenna Gilbert. Lovena Le, [place], [place] Disposition Counselor

## 2015-02-09 NOTE — ED Notes (Signed)
UNABLE TO GET BLUE TOP FOR PATIENT, HE IS ABOUT TO GO TO MRI
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
## 2015-02-09 NOTE — Progress Notes (Signed)
Disposition CSW completed referrals to the following inpatient psych placements:  [facility] will continue to assist with patient's disposition needs.  [facility] Disposition CSW [phone]

## 2015-02-09 NOTE — ED Notes (Signed)
Dr Daryl Eastern DNP into see

## 2015-02-09 NOTE — Progress Notes (Signed)
49 year old male pt admitted on involuntary basis. Pt reports that he has been feeling depressed, suicidal and has been drinking and spoke about how he went to get his medications filled but was unable to because his medicaid ran out. Pt reports he has been drinking and spoke about how he has nothing to live for. Pt presents as depressed and passively suicidal on admission but able to contract for safety on the unit. Pt was oriented to the unit and safety maintained.

## 2015-02-09 NOTE — ED Notes (Signed)
Pt. Noted in sleeping room. No complaints or concerns voiced. No distress or abnormal behavior noted. Will continue to monitor with security cameras. Q 15 minute rounds continue.

## 2015-02-09 NOTE — ED Notes (Signed)
Up to the bathroom 

## 2015-02-09 NOTE — Tx Team (Signed)
Initial Interdisciplinary Treatment Plan   PATIENT STRESSORS: Financial difficulties Medication change or noncompliance Substance abuse   PATIENT STRENGTHS: Ability for insight Average or above average intelligence General fund of knowledge Motivation for treatment/growth   PROBLEM LIST: Problem List/Patient Goals Date to be addressed Date deferred Reason deferred Estimated date of resolution  "Got nothing to live for" 02/09/15     Depression 02/09/15     Suicidal Ideation 02/09/15     ETOH abuse 02/09/15                                    DISCHARGE CRITERIA:  Ability to meet basic life and health needs Improved stabilization in mood, thinking, and/or behavior Verbal commitment to aftercare and medication compliance Withdrawal symptoms are absent or subacute and managed without 24-hour nursing intervention  PRELIMINARY DISCHARGE PLAN: Attend aftercare/continuing care group Placement in alternative living arrangements  PATIENT/FAMIILY INVOLVEMENT: This treatment plan has been presented to and reviewed with the patient, [name], and/or family member, .  The patient and family have been given the opportunity to ask questions and make suggestions.  [place], Merrick 02/09/2015, 11:32 PM

## 2015-02-09 NOTE — ED Notes (Signed)
Report received from [name] RN. Pt. Alert and oriented in no distress denies SI, HI, AVH and pain.  Pt. Instructed to come to me with problems or concerns.Will continue to monitor for safety via security cameras and Q 15 minute checks. 

## 2015-02-10 DIAGNOSIS — F10129 Alcohol abuse with intoxication, unspecified: Secondary | ICD-10-CM | POA: Diagnosis present

## 2015-02-10 DIAGNOSIS — F1020 Alcohol dependence, uncomplicated: Secondary | ICD-10-CM

## 2015-02-10 MED ORDER — LORAZEPAM 1 MG PO TABS
1.0000 mg | ORAL_TABLET | Freq: Four times a day (QID) | ORAL | Status: AC
  Administered 2015-02-10: 1 mg via ORAL
  Filled 2015-02-10: qty 1

## 2015-02-10 NOTE — [facility] Group Notes (Signed)
[facility] Group Notes:  (Clinical Social Work)  02/10/2015  1:15-2:15PM  Summary of Progress/Problems:   The main focus of today's process group was to   1)  discuss the importance of adding supports  2)  define health supports versus unhealthy supports  3)  identify the patient's current unhealthy supports and plan how to handle them  4)  Identify the patient's current healthy supports and plan what to add.  An emphasis was placed on using counselor, doctor, therapy groups, 12-step groups, and problem-specific support groups to expand supports.    The patient expressed full comprehension of the concepts presented, and agreed that there is a need to add more supports.  The patient stated his sole support is the hospital, did not make unsolicited remarks during group, had to be questioned directly.  Type of Therapy:  Process Group with Motivational Interviewing  Participation Level:  Minimal  Participation Quality:  Attentive  Affect:  Depressed and Flat  Cognitive:  Confused  Insight:  Limited  Engagement in Therapy:  Limited  Modes of Intervention:   Education, Support and Processing, Activity  Selmer Dominion, LCSW 02/10/2015
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
## 2015-02-10 NOTE — Progress Notes (Signed)
[facility] Group Notes:  (Nursing/MHT/Case Management/Adjunct)  Date:  02/10/2015  Time:  10:25 PM Did not attend group.  Jeanette Caprice 02/10/2015, 10:25 PM

## 2015-02-10 NOTE — B[facility] Suicide Risk Assessment (Signed)
[facility] Admission Suicide Risk Assessment   Nursing information obtained from:    Demographic factors:    Current Mental Status:    Loss Factors:    Historical Factors:    Risk Reduction Factors:    Total Time spent with patient: 45 minutes Principal Problem: Alcohol abuse with intoxication Diagnosis:   Patient Active Problem List   Diagnosis Date Noted  . Alcohol abuse with intoxication [F10.129] 02/10/2015  . Alcohol dependence with withdrawal, uncomplicated [X44.818] 56/31/4970  . Substance induced mood disorder [F19.94] 02/09/2015  . Suicidal ideations [R45.851] 02/09/2015  . Alcohol dependence with alcohol-induced mood disorder [F10.24]   . Suicidal ideation [R45.851]   . GAD (generalized anxiety disorder) [F41.1] 09/18/2014  . Recurrent major depression-severe [F33.2] 09/18/2014  . Alcohol abuse with alcohol-induced mood disorder [F10.14] 09/17/2014  . Alcohol intoxication [F10.129]   . DVT, lower extremity [I82.409]   . Left leg DVT [I82.402] 06/06/2014  . Alcohol dependence [F10.20] 01/27/2014  . Upper leg DVT (deep venous thromboembolism), acute [I82.4Y9] 06/12/2013  . Homeless single person [Z59.0] 06/12/2013  . 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 Cellulitis of leg, left [L03.116] 01/03/2013  . Subtherapeutic international normalized ratio (INR) [R79.1] 12/18/2012  . Alcohol withdrawal [F10.239] 11/03/2012  . Major depressive disorder, recurrent episode, severe, without mention of psychotic behavior [F33.2] 11/03/2012  . Coumadin toxicity [T60.4X1A] 05/20/2012  . Hematuria [R31.9] 05/20/2012  . Deep vein thrombosis [I82.409] 04/13/2012  . DVT of lower limb, acute [I82.409] 03/28/2012  . 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 DVT of lower extremity, bilateral [I82.403] 01/28/2012  . Anemia [D64.9] 01/28/2012  . Degenerative joint disease [M19.90]   . Alcohol abuse [F10.10]  03/01/2011  . Tobacco abuse [Z72.0] 03/01/2011     Continued Clinical Symptoms:  Alcohol Use Disorder Identification Test Final Score (AUDIT): 29 The "Alcohol Use Disorders Identification Test", Guidelines for Use in Primary Care, Second Edition.  World Pharmacologist [facility]). Score between 0-7:  no or low risk or alcohol related problems. Score between 8-15:  moderate risk of alcohol related problems. Score between 16-19:  high risk of alcohol related problems. Score 20 or above:  warrants further diagnostic evaluation for alcohol dependence and treatment.   CLINICAL FACTORS:   Severe Anxiety and/or Agitation Depression:   Anhedonia Comorbid alcohol abuse/dependence Hopelessness Impulsivity Insomnia Recent sense of peace/wellbeing Severe Alcohol/Substance Abuse/Dependencies More than one psychiatric diagnosis Unstable or Poor Therapeutic Relationship Previous Psychiatric Diagnoses and Treatments Medical Diagnoses and Treatments/Surgeries   Musculoskeletal: Strength & Muscle Tone: decreased Gait & Station: normal Patient leans: N/A  Psychiatric Specialty Exam: Physical Exam  ROS  Blood pressure 144/79, pulse 55, temperature 98.3 F (36.8 C), temperature source Oral, resp. rate 18, height 6\' 2"  (1.88 m), weight 92.987 kg (205 lb).Body mass index is 26.31 kg/(m^2).  General Appearance: Disheveled and Guarded  Eye Contact::  Minimal  Speech:  Slow  Volume:  Decreased  Mood:  Anxious, Depressed, Hopeless and Worthless  Affect:  Constricted and Depressed  Thought Process:  Coherent and Goal Directed  Orientation:  Full (Time, Place, and Person)  Thought Content:  Rumination  Suicidal Thoughts:  No  Homicidal Thoughts:  No  Memory:  Immediate;   Fair Recent;   Fair  Judgement:  Impaired  Insight:  Lacking  Psychomotor Activity:  Restlessness  Concentration:  Fair  Recall:  Moncure  Language: Good  Akathisia:  Negative  Handed:  Right  AIMS (if indicated):     Assets:  Communication Skills Desire for Improvement Leisure Time Resilience Social Support  Sleep:     Cognition: WNL  ADL's:  Impaired     COGNITIVE FEATURES THAT CONTRIBUTE TO RISK:  Closed-mindedness, Loss of executive function and Polarized thinking    SUICIDE RISK:   Moderate:  Frequent suicidal ideation with limited intensity, and duration, some specificity in terms of plans, no associated intent, good self-control, limited dysphoria/symptomatology, some risk factors present, and identifiable protective factors, including available and accessible social support.  PLAN OF CARE: Admit for crisis stabilization, safety monitoring and medication management of alcohol detox treatment and substance induced mood disorder.  Medical Decision Making:  Review of Psycho-Social Stressors (1), Review or order clinical lab tests (1), Review of Last Therapy Session (1), Review of Medication Regimen & Side Effects (2) and Review of New Medication or Change in Dosage (2)  I certify that inpatient services furnished can reasonably be expected to improve the patient's condition.   Yaslin Kirtley,JANARDHAHA R. 02/10/2015, 4:05 PM

## 2015-02-10 NOTE — Progress Notes (Signed)
D: Pt presents with flat affect and depressed mood. Pt tremulous on approach. Pt reported withdrawal symptoms of chills, sweats, anxiety and itching. Pt given prn med this morning along with scheduled meds for anxiety. Pt denies suicidal thoughts. Pt rates depression 5/10. Pt denies auditory and visual hallucinations. Pt minimal this morning and have been in bed resting this morning. Pt has little engagement and forwarded little information. Pt appears disheveled. Pt compliant with meds and no adverse reactions to meds verbalized by pt.  A: Medications administered as ordered per MD. Verbal support given. Pt encouraged to attend groups. 15 minute checks performed for safety.  R: Pt receptive to treatment.
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
## 2015-02-10 NOTE — [facility] Suicide Risk Assessment (Signed)
[place] INPATIENT:  Family/Significant Other Suicide Prevention Education  Suicide Prevention Education:  Patient Refusal for Family/Significant Other Suicide Prevention Education: The patient [name] has refused to provide written consent for family/significant other to be provided Family/Significant Other Suicide Prevention Education during admission and/or prior to discharge.  Physician notified.  States there is nobody to contact.  Lysle Dingwall 02/10/2015, 5:11 PM

## 2015-02-11 DIAGNOSIS — F332 Major depressive disorder, recurrent severe without psychotic features: Secondary | ICD-10-CM

## 2015-02-11 DIAGNOSIS — R45851 Suicidal ideations: Secondary | ICD-10-CM

## 2015-02-11 DIAGNOSIS — F1024 Alcohol dependence with alcohol-induced mood disorder: Secondary | ICD-10-CM

## 2015-02-11 MED ORDER — PERMETHRIN 5 % EX CREA
TOPICAL_CREAM | Freq: Once | CUTANEOUS | Status: AC
  Administered 2015-02-11: 22:00:00 via TOPICAL
  Filled 2015-02-11 (×2): qty 60

## 2015-02-11 MED ORDER — DOXYCYCLINE HYCLATE 100 MG PO TABS
100.0000 mg | ORAL_TABLET | Freq: Two times a day (BID) | ORAL | Status: DC
Start: 2015-02-11 — End: 2015-02-16
  Administered 2015-02-11 – 2015-02-15 (×8): 100 mg via ORAL
  Filled 2015-02-11 (×10): qty 1
  Filled 2015-02-11 (×2): qty 5
  Filled 2015-02-11 (×2): qty 1

## 2015-02-11 MED ORDER — HYDROXYZINE HCL 50 MG PO TABS
50.0000 mg | ORAL_TABLET | Freq: Four times a day (QID) | ORAL | Status: DC | PRN
  Administered 2015-02-11 – 2015-02-15 (×7): 50 mg via ORAL
  Filled 2015-02-11 (×7): qty 1

## 2015-02-11 NOTE — Progress Notes (Signed)
Did not attend group.  [name] 02/11/2015, 8:57 PM

## 2015-02-11 NOTE — Progress Notes (Signed)
D. Pt had been up periodically this evening, limited interaction or participation. Pt had reported that he was having a rough day today, has appeared depressed and has had visible tremors and has spoken about how he is still experiencing withdrawal symptoms. Pt did receive medications this evening without incident and spoke about just wanting to go to bed to get a good night's rest. A. Support and encouragement provided. R. Safety maintained, will continue to monitor.

## 2015-02-11 NOTE — [facility] Group Notes (Signed)
[facility] LCSW Aftercare Discharge Planning Group Note   02/11/2015 12:25 PM    Participation Quality:  Appropraite  Mood/Affect:  Appropriate  Depression Rating:  8  Anxiety Rating:  6  Thoughts of Suicide:  No  Will you contract for safety?   NA  Current AVH:  No  Plan for Discharge/Comments:  Patient attended discharge planning group and actively participated in group. He is interested in residential treatment.  Suicide prevention education reviewed and SPE document provided.   Transportation Means: Patient has transportation.   Supports:  Patient has a support system.   Najat Olazabal, [name]

## 2015-02-11 NOTE — Progress Notes (Addendum)
[facility] MD Progress Note  02/11/2015 6:55 PM Keyshaun Exley  MRN:  [id number] Subjective:  Patient reports depression and anxiety, which he attributes mostly to loss of medicaid benefits and concern about how he is going to afford medications moving forward. Objective : I have discussed case with treatment team and have met with patient. Patient is a 49 year old man with a history of alcohol dependence, depression, and long standing homelessness. He also has a history of DVT , for which he has been on Xarelto, with good results. As noted, he states he recently found out he has lost Medicaid Benefits and this has led to anxiety and depression. He was also drinking heavily prior to admission but currently has no severe symptoms of WDL- mild distal tremors, no diaphoresis, no acute distress or restlessness . No disruptive behaviors on unit. He remains isolated in room , with little interactions with others, but polite and cooperative upon approach. Focused on and ruminative about stressors as above. Fairly responsive to support, empathy.   Principal Problem: Major depressive disorder, recurrent episode, severe Diagnosis:   Patient Active Problem List   Diagnosis Date Noted  . Alcohol abuse with intoxication [F10.129] 02/10/2015  . Alcohol dependence with withdrawal, uncomplicated [H70.263] 78/58/8502  . Substance induced mood disorder [F19.94] 02/09/2015  . Suicidal ideations [R45.851] 02/09/2015  . Alcohol dependence with alcohol-induced mood disorder [F10.24]   . Suicidal ideation [R45.851]   . GAD (generalized anxiety disorder) [F41.1] 09/18/2014  . Recurrent major depression-severe [F33.2] 09/18/2014  . Alcohol abuse with alcohol-induced mood disorder [F10.14] 09/17/2014  . Alcohol intoxication [F10.129]   . DVT, lower extremity [I82.409]   . Left leg DVT [I82.402] 06/06/2014  . Alcohol dependence [F10.20] 01/27/2014  . Upper leg DVT (deep venous thromboembolism), acute [I82.4Y9] 06/12/2013   . Homeless single person [Z59.0] 06/12/2013  . 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 Cellulitis of leg, left [L03.116] 01/03/2013  . Subtherapeutic international normalized ratio (INR) [R79.1] 12/18/2012  . Alcohol withdrawal [F10.239] 11/03/2012  . Major depressive disorder, recurrent episode, severe [F33.2] 11/03/2012  . Coumadin toxicity [T60.4X1A] 05/20/2012  . Hematuria [R31.9] 05/20/2012  . Deep vein thrombosis [I82.409] 04/13/2012  . DVT of lower limb, acute [I82.409] 03/28/2012  . 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 DVT of lower extremity, bilateral [I82.403] 01/28/2012  . Anemia [D64.9] 01/28/2012  . Degenerative joint disease [M19.90]   . Alcohol abuse [F10.10] 03/01/2011  . Tobacco abuse [Z72.0] 03/01/2011   Total Time spent with patient: 25 minutes   Past Medical History:  Past Medical History  Diagnosis Date  . Coronary artery disease   . Degenerative joint disease of spine   . Pulmonary embolism 06/2010  . Degenerative joint disease   . 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 ETOH abuse   . DVT (deep venous thrombosis)   . Hepatitis B   . Mental disorder   . Depression     Past Surgical History  Procedure Laterality Date  . Left elbow surgery    . Tonsillectomy     Family History:  Family History  Problem Relation Age of Onset  . Cancer Mother     Ovarian   Social History:  History  Alcohol Use  . 14.4 oz/week  . 24 Cans of beer per week    Comment: cas of beer daily     History  Drug Use No    History   Social History  .  Marital Status: Legally Separated    Spouse Name: N/A  . Number of Children: N/A  . Years of Education: N/A   Occupational History  . Homeless   .     Social History Main Topics  . Smoking status: Current Every Day Smoker -- 2.00 packs/day for 30 years    Types: Cigarettes  . Smokeless tobacco: Never Used  .  Alcohol Use: 14.4 oz/week    24 Cans of beer per week     Comment: cas of beer daily  . Drug Use: No  . Sexual Activity: No   Other Topics Concern  . None   Social History Narrative   Estranged from family.  Homeless.     Additional History:    Sleep: Good  Appetite:  Fair   Assessment:   Musculoskeletal: Strength & Muscle Tone: within normal limits Gait & Station: normal Patient leans: N/A   Psychiatric Specialty Exam: Physical Exam  ROS- no vomiting, no nausea, mild tremors, no visual disturbances or hallucinations   Blood pressure 93/63, pulse 114, temperature 97.6 F (36.4 C), temperature source Oral, resp. rate 16, height 6' 2"  (1.88 m), weight 205 lb (92.987 kg).Body mass index is 26.31 kg/(m^2).  General Appearance: Fairly Groomed  Engineer, water::  Good  Speech:  Slow  Volume:  Normal  Mood:  Anxious and Depressed  Affect:  Constricted  Thought Process:  Goal Directed and Linear  Orientation:  Other:  fully alert and attentive  Thought Content:  denies hallucinations, no delusions expressed, foucsed on stressors as above   Suicidal Thoughts:  Yes.  without intent/plan- able to contract for safety on the unit at this time  Homicidal Thoughts:  No  Memory:  Recent and Remote grossly intact  Judgement:  Fair  Insight:  Present  Psychomotor Activity:  Normal  Concentration:  Good  Recall:  Good  Fund of Knowledge:Good  Language: Good  Akathisia:  Negative  Handed:  Right  AIMS (if indicated):     Assets:  Desire for Improvement Resilience  ADL's:  Impaired  Cognition: WNL  Sleep:  Number of Hours: 6.75     Current Medications: Current Facility-Administered Medications  Medication Dose Route Frequency Provider Last Rate Last Dose  . acamprosate (CAMPRAL) tablet 666 mg  666 mg Oral TID WC Patrecia Pour, NP   666 mg at 02/11/15 1733  . acetaminophen (TYLENOL) tablet 650 mg  650 mg Oral Q6H PRN Patrecia Pour, NP   650 mg at 02/11/15 1150  . alum & mag  hydroxide-simeth (MAALOX/MYLANTA) 200-200-20 MG/5ML suspension 30 mL  30 mL Oral Q4H PRN Patrecia Pour, NP      . citalopram (CELEXA) tablet 20 mg  20 mg Oral Daily Patrecia Pour, NP   20 mg at 02/11/15 0753  . hydrOXYzine (ATARAX/VISTARIL) tablet 25 mg  25 mg Oral Q6H PRN Patrecia Pour, NP   25 mg at 02/11/15 1150  . magnesium hydroxide (MILK OF MAGNESIA) suspension 30 mL  30 mL Oral Daily PRN Patrecia Pour, NP      . multivitamin with minerals tablet 1 tablet  1 tablet Oral Daily Patrecia Pour, NP   1 tablet at 02/11/15 0753  . nicotine (NICODERM CQ - dosed in mg/24 hours) patch 21 mg  21 mg Transdermal Q0600 Patrecia Pour, NP   21 mg at 02/11/15 0759  . rivaroxaban (XARELTO) tablet 20 mg  20 mg Oral Q supper Patrecia Pour, NP  20 mg at 02/11/15 1733  . thiamine (VITAMIN B-1) tablet 100 mg  100 mg Oral Daily Patrecia Pour, NP   100 mg at 02/11/15 9914  . traZODone (DESYREL) tablet 200 mg  200 mg Oral QHS PRN Patrecia Pour, NP   200 mg at 02/10/15 2114  . ziprasidone (GEODON) injection 20 mg  20 mg Intramuscular Once Patrecia Pour, NP   20 mg at 02/09/15 2330    Lab Results: No results found for this or any previous visit (from the past 48 hour(s)).  Physical Findings: AIMS: Facial and Oral Movements Muscles of Facial Expression: None, normal Lips and Perioral Area: None, normal Jaw: None, normal Tongue: None, normal,Extremity Movements Upper (arms, wrists, hands, fingers): None, normal Lower (legs, knees, ankles, toes): None, normal, Trunk Movements Neck, shoulders, hips: None, normal, Overall Severity Severity of abnormal movements (highest score from questions above): None, normal Incapacitation due to abnormal movements: None, normal Patient's awareness of abnormal movements (rate only patient's report): No Awareness, Dental Status Current problems with teeth and/or dentures?: No Does patient usually wear dentures?: No  CIWA:  CIWA-Ar Total: 12 COWS:      Assessment-  patient is known to our unit from prior admissions. History of alcohol dependence and depression. Recent issues with medicaid benefits and concerns about inability to afford medications have resulted in worsening depression. Also drinking heavily, but not currently presenting with severe alcohol WDL symptoms. Remains depressed, isolative , but not currently suicidal. No current severe alcohol WDL symptoms, has completed Ativan PRN detox as per CIWA.    Treatment Plan Summary: Daily contact with patient to assess and evaluate symptoms and progress in treatment, Medication management, Plan conitnue inpatient treatment  and continue medications as below  Xarelto for history of DVT.  Celexa 20 mgrs QDAY for Depression and Anxiety Campral 666 mgrs TID for management of alcohol cravings  Trazodone 200 mgrs QHS PRN Insomnia    Medical Decision Making:  Established Problem, Stable/Improving (1), Review of Psycho-Social Stressors (1), Review or order clinical lab tests (1) and Review of Medication Regimen & Side Effects (2)     COBOS, FERNANDO 02/11/2015, 6:55 PM

## 2015-02-11 NOTE — Progress Notes (Signed)
D- Patient is observed occasionally in the milieu with minimal interaction.  Majority of patient's time is spent lying in the bed.  Denies SI/HI/AVH.  Patient expresses that he is having a "rough" day.  He has complaints of headache and muscle aches.  Patient was given pain medication according to MD order. See MAR.  On patient's self-inventory he rates his depression and anxiety "6" and his hopelessness an "8" with "10" being the worst.  Patient reports "improved concentration".      A- Support and encouragement provided.  Routine safety checks conducted every 15 minutes.  Patient informed to notify staff with problems or concerns. R- Patient contracts for safety at this time. Patient compliant with medications.  Patient calm and cooperative.  Safety maintained on the unit.

## 2015-02-11 NOTE — Progress Notes (Signed)
Recreation Therapy Notes  Date: 06.06.16 Time: 9:30 am Location: [address] Group Room  Group Topic: Coping Skills  Goal Area(s) Addresses:  Patient will verbalize importance of using healthy stress management. Patient will identify positive emotions associated with healthy stress management.  Intervention: Stress Management  Activity: Healthy Relaxation Guided Imagery.  LRT introduced and educated patients on stress management technique of guided imagery.  Script was used to deliver the technique to patients.  Patients were asked to follow script read aloud by LRT to engage in practicing the stress management technique.   Education: Radiographer, therapeutic, Dentist.   Education Outcome: Acknowledges understanding/In group clarification offered/Needs additional education.   Clinical Observations/Feedback: Patient did not attend group.    Victorino Sparrow, LRT/CTRS         Victorino Sparrow A 02/11/2015 3:32 PM

## 2015-02-11 NOTE — Progress Notes (Signed)
  S: Notified by NS, patient concerned about diffuse itching rash x 2 days duration, in addition to a tick lodged into his right scrotal sac. [place] He is denying any fever, chills, rigors or N/V symptoms at this time. There are NKDA. Patient was admitted on 6/5 with MDD and long standing alcohol abuse.    0: V/s 976 P 54 R16 BP 128/86  6/3, LFT's wnl, BUN 8, creatinine 0.81  Integument: noted solo tick lodged into right scrotum, no STS, induration, red streaks or cellulitis noted. No palpable inguinal adenopathy appreciated. Diffuse red papular excoriated eruptions over extensor surfaces wrist bilaterally, back and suprapubic margins of groin.                                               A/P:  1) right scrotum prepped with betadine and alcohol, tick removed with tweezers 2) Will Rx doxycyline 100 mg BID x 14 days 3) Permethrin cream x one application 4) Atarax prn itching

## 2015-02-12 NOTE — Progress Notes (Signed)
Patient ID: [name], male   DOB: [birth year], 49 y.o.   MRN: [id number] [facility] MD Progress Note  02/12/2015 7:26 PM [name]  MRN:  [id number] Subjective:  Patient states he feels miserable and upset. Part of this is related to discomfort related to itching/pruritis (has been diagnosed with scabies). He also reports feeling anxious about "what I'm going to do after discharge". Denies medication side effects.   Objective : I have discussed case with treatment team and have met with patient. Patient remains depressed, dysphoric, feeling subjectively "upset" "miserable". He is depressed and reports passive thoughts of death but contracts for safety on the unit and denies intention of hurting self. Remains ruminative due to psychosocial stressors related to having been dropped from [facility]. Of note, he is not presenting of any severe alcohol withdrawal symptoms at this time, no tremors, diaphoresis, or psychomotor agitation. No medication side effects.  He is responsive to support, empathy but remains ruminative.   Principal Problem: Major depressive disorder, recurrent episode, severe Diagnosis:   Patient Active Problem List   Diagnosis Date Noted  . Alcohol abuse with intoxication [F10.129] 02/10/2015  . Alcohol dependence with withdrawal, uncomplicated [E52.778] 24/23/5361  . Substance induced mood disorder [F19.94] 02/09/2015  . Suicidal ideations [R45.851] 02/09/2015  . Alcohol dependence with alcohol-induced mood disorder [F10.24]   . Suicidal ideation [R45.851]   . GAD (generalized anxiety disorder) [F41.1] 09/18/2014  . Recurrent major depression-severe [F33.2] 09/18/2014  . Alcohol abuse with alcohol-induced mood disorder [F10.14] 09/17/2014  . Alcohol intoxication [F10.129]   . DVT, lower extremity [I82.409]   . Left leg DVT [I82.402] 06/06/2014  . Alcohol dependence [F10.20] 01/27/2014  . Upper leg DVT (deep venous thromboembolism), acute [I82.4Y9] 06/12/2013  . Homeless  single person [Z59.0] 06/12/2013  . 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 Cellulitis of leg, left [L03.116] 01/03/2013  . Subtherapeutic international normalized ratio (INR) [R79.1] 12/18/2012  . Alcohol withdrawal [F10.239] 11/03/2012  . Major depressive disorder, recurrent episode, severe [F33.2] 11/03/2012  . Coumadin toxicity [T60.4X1A] 05/20/2012  . Hematuria [R31.9] 05/20/2012  . Deep vein thrombosis [I82.409] 04/13/2012  . DVT of lower limb, acute [I82.409] 03/28/2012  . 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 DVT of lower extremity, bilateral [I82.403] 01/28/2012  . Anemia [D64.9] 01/28/2012  . Degenerative joint disease [M19.90]   . Alcohol abuse [F10.10] 03/01/2011  . Tobacco abuse [Z72.0] 03/01/2011   Total Time spent with patient: 25 minutes   Past Medical History:  Past Medical History  Diagnosis Date  . Coronary artery disease   . Degenerative joint disease of spine   . Pulmonary embolism 06/2010  . Degenerative joint disease   . 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 ETOH abuse   . DVT (deep venous thrombosis)   . Hepatitis B   . Mental disorder   . Depression     Past Surgical History  Procedure Laterality Date  . Left elbow surgery    . Tonsillectomy     Family History:  Family History  Problem Relation Age of Onset  . Cancer Mother     Ovarian   Social History:  History  Alcohol Use  . 14.4 oz/week  . 24 Cans of beer per week    Comment: cas of beer daily     History  Drug Use No    History   Social History  . Marital Status: Legally Separated    Spouse Name: N/A  .  Number of Children: N/A  . Years of Education: N/A   Occupational History  . Homeless   .     Social History Main Topics  . Smoking status: Current Every Day Smoker -- 2.00 packs/day for 30 years    Types: Cigarettes  . Smokeless tobacco: Never Used  . Alcohol Use: 14.4  oz/week    24 Cans of beer per week     Comment: cas of beer daily  . Drug Use: No  . Sexual Activity: No   Other Topics Concern  . None   Social History Narrative   Estranged from family.  Homeless.     Additional History:    Sleep: Good  Appetite:  Fair   Assessment:   Musculoskeletal: Strength & Muscle Tone: within normal limits Gait & Station: normal Patient leans: N/A   Psychiatric Specialty Exam: Physical Exam  ROS- Pruritis/itching  Blood pressure 127/69, pulse 52, temperature 97.9 F (36.6 C), temperature source Oral, resp. rate 17, height _0  (1.88 m), weight 205 lb (92.987 kg).Body mass index is 26.31 kg/(m^2).  General Appearance: Fairly Groomed  Engineer, water::  Good  Speech:  Slow  Volume:  Normal  Mood:  Still depressed  Affect:  Constricted and mildly irritable  Thought Process:  Goal Directed and Linear  Orientation:  Other:  fully alert and attentive  Thought Content:  denies hallucinations, no delusions expressed, foucsed on stressors as above   Suicidal Thoughts:  Yes.  without intent/plan- able to contract for safety on the unit at this time  Homicidal Thoughts:  No  Memory:  Recent and Remote grossly intact  Judgement:  Fair  Insight:  Present  Psychomotor Activity:  Normal  Concentration:  Good  Recall:  Good  Fund of Knowledge:Good  Language: Good  Akathisia:  Negative  Handed:  Right  AIMS (if indicated):     Assets:  Desire for Improvement Resilience  ADL's:  Impaired  Cognition: WNL  Sleep:  Number of Hours: 6.75     Current Medications: Current Facility-Administered Medications  Medication Dose Route Frequency Provider Last Rate Last Dose  . acamprosate (CAMPRAL) tablet 666 mg  666 mg Oral TID WC Patrecia Pour, NP   666 mg at 02/12/15 1723  . acetaminophen (TYLENOL) tablet 650 mg  650 mg Oral Q6H PRN Patrecia Pour, NP   650 mg at 02/11/15 1150  . alum & mag hydroxide-simeth (MAALOX/MYLANTA) 200-200-20 MG/5ML suspension 30  mL  30 mL Oral Q4H PRN Patrecia Pour, NP      . citalopram (CELEXA) tablet 20 mg  20 mg Oral Daily Patrecia Pour, NP   20 mg at 02/12/15 0900  . doxycycline (VIBRA-TABS) tablet 100 mg  100 mg Oral Q12H Maurine Minister Simon, PA-C   100 mg at 02/12/15 0900  . hydrOXYzine (ATARAX/VISTARIL) tablet 50 mg  50 mg Oral Q6H PRN Laverle Hobby, PA-C   50 mg at 02/12/15 1543  . magnesium hydroxide (MILK OF MAGNESIA) suspension 30 mL  30 mL Oral Daily PRN Patrecia Pour, NP      . multivitamin with minerals tablet 1 tablet  1 tablet Oral Daily Patrecia Pour, NP   1 tablet at 02/12/15 1323  . nicotine (NICODERM CQ - dosed in mg/24 hours) patch 21 mg  21 mg Transdermal Q0600 Patrecia Pour, NP   21 mg at 02/12/15 0900  . rivaroxaban (XARELTO) tablet 20 mg  20 mg Oral Q supper Patrecia Pour,  NP   20 mg at 02/12/15 1723  . thiamine (VITAMIN B-1) tablet 100 mg  100 mg Oral Daily Patrecia Pour, NP   100 mg at 02/12/15 0900  . traZODone (DESYREL) tablet 200 mg  200 mg Oral QHS PRN Patrecia Pour, NP   200 mg at 02/11/15 2230  . ziprasidone (GEODON) injection 20 mg  20 mg Intramuscular Once Patrecia Pour, NP   20 mg at 02/09/15 2330    Lab Results: No results found for this or any previous visit (from the past 48 hour(s)).  Physical Findings: AIMS: Facial and Oral Movements Muscles of Facial Expression: None, normal Lips and Perioral Area: None, normal Jaw: None, normal Tongue: None, normal,Extremity Movements Upper (arms, wrists, hands, fingers): None, normal Lower (legs, knees, ankles, toes): None, normal, Trunk Movements Neck, shoulders, hips: None, normal, Overall Severity Severity of abnormal movements (highest score from questions above): None, normal Incapacitation due to abnormal movements: None, normal Patient's awareness of abnormal movements (rate only patient's report): No Awareness, Dental Status Current problems with teeth and/or dentures?: No Does patient usually wear dentures?: No  CIWA:   CIWA-Ar Total: 15 COWS:      Assessment- Patient still depressed vaguely irritable ruminating about his significant psychosocial stressors. He is tolerating Celexa trial well. Of note, he is not presenting with any severe alcohol withdrawal at this time. He has been diagnosed with scabies and was found to have a tick which was removed. He is now on Doxycycline and Permethrin cream.    Treatment Plan Summary: Daily contact with patient to assess and evaluate symptoms and progress in treatment, Medication management, Plan conitnue inpatient treatment  and continue medications as below  Xarelto for history of DVT.  Celexa 20 mgrs QDAY for Depression and Anxiety Campral 666 mgrs TID for management of alcohol cravings  Trazodone 200 mgrs QHS PRN Insomnia Doxycycline for identified tick Permethrin for scabies Regarding alcohol dependence, continue to encourage patient to consider going to a rehab and participating in [place] after discharge    Medical Decision Making:  Established Problem, Stable/Improving (1), Review of Psycho-Social Stressors (1), Review or order clinical lab tests (1) and Review of Medication Regimen & Side Effects (2)     COBOS, FERNANDO 02/12/2015, 7:26 PM

## 2015-02-12 NOTE — Progress Notes (Signed)
Recreation Therapy Notes  Animal-Assisted Activity (AAA) Program Checklist/Progress Notes Patient Eligibility Criteria Checklist & Daily Group note for Rec Tx Intervention  Date: 06.07.16 Time: 2:30pm Location: [address]   AAA/T Program Assumption of Risk Form signed by Patient/ or Parent Legal Guardian yes  Patient is free of allergies or sever asthma yes  Patient reports no fear of animals yes  Patient reports no history of cruelty to animals yes  Patient understands his/her participation is voluntary yes  Patient washes hands before animal contact yes  Patient washes hands after animal contact yes  Education: Hand Washing, Appropriate Animal Interaction   Education Outcome: Acknowledges understanding/In group clarification offered/Needs additional education.   Clinical Observations/Feedback: Patient did not attend group.   Victorino Sparrow, LRT/CTRS         Victorino Sparrow A 02/12/2015 4:09 PM

## 2015-02-12 NOTE — Progress Notes (Signed)
D: Pt presents with flat affect and depressed mood. Pt denies any withdrawal symptoms, AVH and depression. Pt restricted to his room for 24 hours per report for scabies treatment. Cobos, MD aware. Pt is aware and compliant. Pt reported sleeping well last night and fair appetite. Pt appears disheveled in scrub. No withdrawal symptoms observed by  Pt compliant with taking meds this morning. No adverse reactions to meds verbalized by pt.  A: Medications administered as ordered per MD. Jenetta Downer support given. Pt encouraged to attend groups. 15 minute checks performed for safety.  R: Pt receptive to treatment.

## 2015-02-12 NOTE — [facility] Group Notes (Signed)
[place] Group Notes:  recovery  Date:  02/12/2015  Time:  5:04 PM  Type of Therapy:  Nurse Education  Participation Level:  Did Not Attend  Participation Quality:  Inattentive  Affect:  Blunted  Cognitive:  Lacking  Insight:  None  Engagement in Group:  None  Modes of Intervention:  Discussion  Summary of Progress/Problems:  Delman Kitten 02/12/2015, 5:04 PM

## 2015-02-12 NOTE — Tx Team (Signed)
Interdisciplinary Treatment Plan Update (Adult)  Date:  02/12/2015  Time Reviewed:  9:39 AM   Progress in Treatment: Attending groups: Patient is attending groups. Participating in groups:  Patient engages in discussion Taking medication as prescribed:  Patient is taking medications Tolerating medication:  Patient is tolerating medications Family/Significant othe contact made:   No, patient declined collateral contact Patient understands diagnosis:Yes, patient understands diagnosis and need for treatment Discussing patient identified problems/goals with staff:  Yes, patient is able to express goals/problems Medical problems stabilized or resolved:  Yes Denies suicidal/homicidal ideation: Yes, patient is denying SI/HI. Issues/concerns per patient self-inventory:   Other:  Discharge Plan or Barriers:  Referral made to [facility] for residential treatment  Reason for Continuation of Hospitalization: Anxiety Depression Medication stabilization Suicidal ideation  Comments:  [name] is a 49 year old caucasian male. Disheveled. Admitted from the [facility] with complaints of suicidal ideations. He states, "The ambulance took me to the hospital 2 days ago. A friend called 911 because I was getting ready to take an overdose of medicines. I was thinking about killing myself for about 1 week. I got Lupus & blood clot to my bones. But, I can't get medicaid to pay for my medicines. I was told by the [place] that I was permanently terminated from the [employer]. No reason was given to me. They said I lost my medicaid because I did not go by the rules.Without my medicine Xarelto, I will die any way. I don't want to die from the horrible pain that the blood clot can generate on a human body. I just want to talk to someone; like the social worker to see how I can get my medicaid back. I have been denied disability x 2.  Additional comments:  Patient and CSW reviewed Patient  Discharge Process Letter/Patient Involvement Form.  Patient verbalized understanding and signed form.  Patient and CSW also reviewed and identified patient's goals and treatment plan.  Patient verbalized understanding and agreed to plan.  Estimated length of stay:  2-4 days  New goal(s):  Review of initial/current patient goals per problem list:  Please see plan of careInterdisciplinary Treatment Plan Update (Adult)  Attendees: Patient 02/12/2015 9:39 AM   Family:   02/12/2015 9:39 AM   Physician:  Neita Garnet, MD 02/12/2015 9:39 AM   Nursing:   Marshall Cork, RN 02/12/2015 9:39 AM   Clinical Social Worker:  Joette Catching, LCSW 02/12/2015 9:39 AM   Clinical Social Worker:  Tilden Fossa, LCSW-A 02/12/2015 9:39 AM   Case Manager:  Lars Pinks, RN 02/12/2015 9:39 AM   Other:  Marcello Moores, RN 02/12/2015 9:39 AM  Other:   02/12/2015  9:39 AM   Other:  02/12/2015 9:39 AM   Other:  02/12/2015 9:39 AM   Other:  02/12/2015 9:39 AM   Other:  Jake Bathe Transition Team Coordinator 02/12/2015 9:39 AM   Other:   02/12/2015 9:39 AM   Other:  02/12/2015 9:39 AM   Other:   02/12/2015 9:39 AM    Scribe for Treatment Team:   Concha Pyo, 02/12/2015   9:39 AM

## 2015-02-12 NOTE — BHH Group Notes (Signed)
[place] LCSW Group Therapy  02/12/2015 4:22 PM  Type of Therapy:  Group Therapy  Participation Level:  Did Not Attend -  Patient restricted to his room.   Concha Pyo 02/12/2015, 4:22 PM

## 2015-02-12 NOTE — Progress Notes (Signed)
D: Pt reports feeling "miserable". Pt reports having SI on/off. Pt verbally contracts for safety. Writer followed-up with pt's compliant of ongoing itching. Pt reports feeling like his skin was "burning".  Pt was noted for a red "pin-point rash. Remarks profoundly noted on his upper back and left wrist. On-call extender was alerted to physically further assess patient's skin. See note by (on-call extender) for details.  A: Pt is currently being treated for Scabies. Writer and pt applied permethrin cream to pt's skin as ordered. Pt was informed that he should rinse the cream off in the morning with warm soapy water. Pt was also informed of his need to remain in the room for 24 hours post treatment. Pt was also given Vistaril and Doxycycline. Bed linens removed and replaced. Clean scrubs were given as well. Continued support and availability as needed was extended to this pt. Staff continue to monitor pt with q18min checks.  AC, Agricultural consultant, and hall [employer] aware of pt's condition.  R: No adverse drug reactions noted. Pt receptive to treatment. Pt remains safe at this time.

## 2015-02-13 MED ORDER — QUETIAPINE FUMARATE 25 MG PO TABS
25.0000 mg | ORAL_TABLET | Freq: Every day | ORAL | Status: DC
  Administered 2015-02-13 – 2015-02-14 (×2): 25 mg via ORAL
  Filled 2015-02-13 (×3): qty 1

## 2015-02-13 NOTE — Progress Notes (Signed)
D. Pt has been in room and in bed for the duration of the evening. Pt spoke about how he is not feeling well today but does not report or display any symptoms of withdrawal. Pt appears to be most concerned with what will happen to him after discharge as that has been a theme in his conversation this evening. Pt did report showering earlier and does report feeling some relief from the itching. A. Support and encouragement provided. R. Safety maintained, will continue to monitor.

## 2015-02-13 NOTE — Progress Notes (Signed)
D: Pt presents irritable on approach. Pt noted to be anxious and easily agitated. Pt reported to writer that he did not sleep well last night due to having nightmares. Pt reported that it's normal for him to have nightmares. Pt reported itching, chills and anxiety. Pt rates depression 5/10. Hopeless 8/10. Anxiety 5/10. Pt compliant with taking meds this morning. Pt requested vistaril this morning prn. Med given as ordered at pt request.  A: Medications administered as ordered per MD. Verbal support given. Pt encouraged to attend groups. 15 minute checks performed for safety.  R: Pt stated goal is to talk to the caseworker about discharge. Pt receptive to treatment.

## 2015-02-13 NOTE — BHH Group Notes (Signed)
  [place] LCSW Group Therapy  Emotional Regulation 1:15 - 2: 30 PM        02/13/2015     Type of Therapy:  Group Therapy  Participation Level:  Appropriate  Participation Quality:  Appropriate  Affect:  Depressed, Irritable  Cognitive:  Appropriate  Insight:  Developing/Improving   Engagement in Therapy:  Developing/Improving   Modes of Intervention:  Discussion Exploration Problem-Solving Supportive  Summary of Progress/Problems:  Group topic was emotional regulations.  Patient participated in the discussion and was able to identified frustration as the emotion he needs to control.  Patient talked about how he tried to fill out a Child psychotherapist by phone but the person was rude and he cursed him out and the person hung up on him.  Patient able to see how his language could keep him from getting what he needs.  Concha Pyo 02/13/2015

## 2015-02-13 NOTE — Progress Notes (Signed)
[place] Group Notes:  (Nursing/MHT/Case Management/Adjunct)  Date:  02/13/2015  Time:  9:39 PM  Type of Therapy:  Psychoeducational Skills  Participation Level:  Minimal  Participation Quality:  Attentive  Affect:  Appropriate  Cognitive:  Appropriate  Insight:  Limited  Engagement in Group:  Limited  Modes of Intervention:  Discussion  Summary of Progress/Problems: Tonight in wrap up group Takota said that he was unsure of what he could see as a personal development for himself. He said that his day was ok, definitely better than yesterday! Jeanette Caprice 02/13/2015, 9:39 PM

## 2015-02-13 NOTE — Progress Notes (Signed)
Recreation Therapy Notes  Date: 06.08.16 Time: 9:30am Location: [address] Group room  Group Topic: Stress Management  Goal Area(s) Addresses:  Patient will verbalize importance of using healthy stress management.  Patient will identify positive emotions associated with healthy stress management.   Intervention: Stress Management  Activity :  Guided Imagery.  LRT introduced and educated patients on stress management technique of guided imagery.  A script was used to deliver the technique to patients.  Patients were asked to follow script read by LRT to engage in practicing the stress management technique.  Education:  Stress Management, Discharge Planning.   Education Outcome: Acknowledges edcuation/In group clarification offered/Needs additional education  Clinical Observations/Feedback: Patient did not attend group.   Victorino Sparrow, LRT/CTRS         Victorino Sparrow A 02/13/2015 2:29 PM

## 2015-02-13 NOTE — Clinical Social Work Note (Signed)
Message from [place] at [facility] advising they will not able to accept patient for treatment because they believe him to be too medically compromised for their facility.

## 2015-02-13 NOTE — Progress Notes (Signed)
Patient ID: [name], male   DOB: [birth year], 49 y.o.   MRN: [id number] [facility] MD Progress Note  02/13/2015 5:01 PM [name]  MRN:  [id number] Subjective:  Continues to report feeling "itchy" and states he's feeling irritated and angry. He ruminates about having lost medicaid benefits and how he's going to get his medications. He is not endorsing any severe alcohol WDL symptoms, but is endorsing cravings.   Objective : I have discussed case with treatment team and have met with patient. As reported by staff, he has been irritable focused on stressors but non-threatening or overtly agitated. Patient has completed treatment for scabies and continues to take Doxycycline for tick bite. Pruritis is ongoing but there is no visible rash. He does have two or three lesions on leg consistent with insect bites.  In addition to irritability, patient remains depressed and is also complaining of insomnia related to very vivid dreams on the trazodone.  No significant WDL at this time, no tremors, no diaphoresis, no psychomotor restleness. Vitals stable. Fairly responsive to support. Patient states he is hoping to get into a rehab but if not "I'd rather go back to my tent in the woods than a shelter".  Denies medication side effects.   Principal Problem: Major depressive disorder, recurrent episode, severe Diagnosis:   Patient Active Problem List   Diagnosis Date Noted  . Alcohol abuse with intoxication [F10.129] 02/10/2015  . Alcohol dependence with withdrawal, uncomplicated [E75.170] 01/74/9449  . Substance induced mood disorder [F19.94] 02/09/2015  . Suicidal ideations [R45.851] 02/09/2015  . Alcohol dependence with alcohol-induced mood disorder [F10.24]   . Suicidal ideation [R45.851]   . GAD (generalized anxiety disorder) [F41.1] 09/18/2014  . Recurrent major depression-severe [F33.2] 09/18/2014  . Alcohol abuse with alcohol-induced mood disorder [F10.14] 09/17/2014  . Alcohol intoxication [F10.129]    . DVT, lower extremity [I82.409]   . Left leg DVT [I82.402] 06/06/2014  . Alcohol dependence [F10.20] 01/27/2014  . Upper leg DVT (deep venous thromboembolism), acute [I82.4Y9] 06/12/2013  . Homeless single person [Z59.0] 06/12/2013  . 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 Cellulitis of leg, left [L03.116] 01/03/2013  . Subtherapeutic international normalized ratio (INR) [R79.1] 12/18/2012  . Alcohol withdrawal [F10.239] 11/03/2012  . Major depressive disorder, recurrent episode, severe [F33.2] 11/03/2012  . Coumadin toxicity [T60.4X1A] 05/20/2012  . Hematuria [R31.9] 05/20/2012  . Deep vein thrombosis [I82.409] 04/13/2012  . DVT of lower limb, acute [I82.409] 03/28/2012  . 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 DVT of lower extremity, bilateral [I82.403] 01/28/2012  . Anemia [D64.9] 01/28/2012  . Degenerative joint disease [M19.90]   . Alcohol abuse [F10.10] 03/01/2011  . Tobacco abuse [Z72.0] 03/01/2011   Total Time spent with patient: 25 minutes   Past Medical History:  Past Medical History  Diagnosis Date  . Coronary artery disease   . Degenerative joint disease of spine   . Pulmonary embolism 06/2010  . Degenerative joint disease   . 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 ETOH abuse   . DVT (deep venous thrombosis)   . Hepatitis B   . Mental disorder   . Depression     Past Surgical History  Procedure Laterality Date  . Left elbow surgery    . Tonsillectomy     Family History:  Family History  Problem Relation Age of Onset  . Cancer Mother     Ovarian   Social History:  History  Alcohol Use  .  14.4 oz/week  . 24 Cans of beer per week    Comment: cas of beer daily     History  Drug Use No    History   Social History  . Marital Status: Legally Separated    Spouse Name: N/A  . Number of Children: N/A  . Years of Education: N/A   Occupational History   . Homeless   .     Social History Main Topics  . Smoking status: Current Every Day Smoker -- 2.00 packs/day for 30 years    Types: Cigarettes  . Smokeless tobacco: Never Used  . Alcohol Use: 14.4 oz/week    24 Cans of beer per week     Comment: cas of beer daily  . Drug Use: No  . Sexual Activity: No   Other Topics Concern  . None   Social History Narrative   Estranged from family.  Homeless.     Additional History:    Sleep: States he slept poorly due to very vivid dreams  Appetite:  Fair   Assessment:   Musculoskeletal: Strength & Muscle Tone: within normal limits Gait & Station: normal Patient leans: N/A   Psychiatric Specialty Exam: Physical Exam  ROS- Pruritis/itching  Blood pressure 104/70, pulse 101, temperature 97.7 F (36.5 C), temperature source Oral, resp. rate 16, height $RemoveBe'6\' 2"'ypjUdiCsz$  (1.88 m), weight 205 lb (92.987 kg).Body mass index is 26.31 kg/(m^2).  General Appearance: Fairly Groomed  Engineer, water::  Good  Speech:  Slow  Volume:  Normal  Mood:  Still depressed  Affect:  More irritable today  Thought Process:  Goal Directed and Linear  Orientation:  Other:  fully alert and attentive  Thought Content:  denies hallucinations, no delusions expressed, foucsed on stressors as above   Suicidal Thoughts:  No -  able to contract for safety on the unit at this time  Homicidal Thoughts:  No  Memory:  Recent and Remote grossly intact  Judgement:  Fair  Insight:  Present  Psychomotor Activity:  Normal  Concentration:  Good  Recall:  Good  Fund of Knowledge:Good  Language: Good  Akathisia:  Negative  Handed:  Right  AIMS (if indicated):     Assets:  Desire for Improvement Resilience  ADL's:  Impaired  Cognition: WNL  Sleep:  Number of Hours: 6.75     Current Medications: Current Facility-Administered Medications  Medication Dose Route Frequency Provider Last Rate Last Dose  . acamprosate (CAMPRAL) tablet 666 mg  666 mg Oral TID WC Patrecia Pour,  NP   666 mg at 02/13/15 1646  . acetaminophen (TYLENOL) tablet 650 mg  650 mg Oral Q6H PRN Patrecia Pour, NP   650 mg at 02/13/15 1646  . alum & mag hydroxide-simeth (MAALOX/MYLANTA) 200-200-20 MG/5ML suspension 30 mL  30 mL Oral Q4H PRN Patrecia Pour, NP      . citalopram (CELEXA) tablet 20 mg  20 mg Oral Daily Patrecia Pour, NP   20 mg at 02/13/15 0756  . doxycycline (VIBRA-TABS) tablet 100 mg  100 mg Oral Q12H Laverle Hobby, PA-C   100 mg at 02/13/15 0756  . hydrOXYzine (ATARAX/VISTARIL) tablet 50 mg  50 mg Oral Q6H PRN Laverle Hobby, PA-C   50 mg at 02/13/15 1646  . magnesium hydroxide (MILK OF MAGNESIA) suspension 30 mL  30 mL Oral Daily PRN Patrecia Pour, NP      . multivitamin with minerals tablet 1 tablet  1 tablet Oral Daily Theodoro Clock  Leander Rams, NP   1 tablet at 02/13/15 1209  . nicotine (NICODERM CQ - dosed in mg/24 hours) patch 21 mg  21 mg Transdermal Q0600 Patrecia Pour, NP   21 mg at 02/13/15 0756  . QUEtiapine (SEROQUEL) tablet 25 mg  25 mg Oral QHS Fernando A Cobos, MD      . rivaroxaban (XARELTO) tablet 20 mg  20 mg Oral Q supper Patrecia Pour, NP   20 mg at 02/13/15 1646  . thiamine (VITAMIN B-1) tablet 100 mg  100 mg Oral Daily Patrecia Pour, NP   100 mg at 02/13/15 0756  . ziprasidone (GEODON) injection 20 mg  20 mg Intramuscular Once Patrecia Pour, NP   20 mg at 02/09/15 2330    Lab Results: No results found for this or any previous visit (from the past 48 hour(s)).  Physical Findings: AIMS: Facial and Oral Movements Muscles of Facial Expression: None, normal Lips and Perioral Area: None, normal Jaw: None, normal Tongue: None, normal,Extremity Movements Upper (arms, wrists, hands, fingers): None, normal Lower (legs, knees, ankles, toes): None, normal, Trunk Movements Neck, shoulders, hips: None, normal, Overall Severity Severity of abnormal movements (highest score from questions above): None, normal Incapacitation due to abnormal movements: None,  normal Patient's awareness of abnormal movements (rate only patient's report): No Awareness, Dental Status Current problems with teeth and/or dentures?: No Does patient usually wear dentures?: No  CIWA:  CIWA-Ar Total: 15 COWS:      Assessment- Patient remains irritable and dysphoric but is no longer presenting with significant WDL symptoms. Beyond the irritability, depression is still noticeable but he is not suicidal. Trazodone likely associated with vivid dreams. Of note, patient not presenting with other symptoms that would suggest mania or hypomania and irritibility seems limited to a response to stressors. Tolerated medications well.    Treatment Plan Summary: Daily contact with patient to assess and evaluate symptoms and progress in treatment, Medication management, Plan conitnue inpatient treatment  and continue medications as below  Xarelto for history of DVT.  Celexa 20 mgrs QDAY for Depression and Anxiety Campral 666 mgrs TID for management of alcohol cravings  D/C trazodone d/t side effects (vivid dreams) Start Seroquel 25 mgs QHS for insomnia, irritability/agitation Doxycycline for identified tick Permethrin for scabies Regarding alcohol dependence, continue to encourage patient to consider going to a rehab and participating in [place] after discharge. CSW is working on Location manager - report: patient has been refused by [facility] but [facility] referral pending    Medical Decision Making:  Established Problem, Stable/Improving (1), Review of Psycho-Social Stressors (1), Review or order clinical lab tests (1) and Review of Medication Regimen & Side Effects (2)     COBOS, FERNANDO 02/13/2015, 5:01 PM

## 2015-02-13 NOTE — Progress Notes (Addendum)
D: Pt reports that he is feeling "better than yesterday". Pt continues to report ongoing itching. Pt informed that his prn Vistaril would be available at 2246 to help relieve his itching. Pt also revisited the occurrence of having vivid nightmares. Pt reports that he was having vivid dreams at home. Pt is aware that his trazodone was discontinued as a factor that may have increased the occurrence of his vivid nightmares/dreams. Pt is hoping to hear of an update on [facility] . Pt is currently negative for any SI/HI/AVH. A: Writer administered scheduled and prn medications to pt. Continued support and availability as needed was extended to this pt. Staff continue to monitor pt with q28min checks.  R: No adverse drug reactions noted. Pt receptive to treatment. Pt remains safe at this time.

## 2015-02-13 NOTE — [facility] Group Notes (Signed)
[facility] LCSW Aftercare Discharge Planning Group Note   02/13/2015 9:48 AM    Participation Quality:  Appropraite  Mood/Affect:  Depressed, Irritable  Depression Rating:  5  Anxiety Rating:  6/7  Thoughts of Suicide:  No  Will you contract for safety?   NA  Current AVH:  No  Plan for Discharge/Comments:  Patient attended discharge planning group and actively participated in group. Patient reports he continues to having problems with itching.  He was informed [facility] will not be able to take him because of his medical problem.  He asked for a referral to [facility].  Suicide prevention education reviewed and SPE document provided.   Transportation Means: Patient has transportation.   Supports:  Patient has a support system.   [name], [name]

## 2015-02-14 MED ORDER — TRAZODONE HCL 100 MG PO TABS
200.0000 mg | ORAL_TABLET | Freq: Once | ORAL | Status: AC
  Administered 2015-02-14: 200 mg via ORAL
  Filled 2015-02-14 (×2): qty 2

## 2015-02-14 MED ORDER — QUETIAPINE FUMARATE 50 MG PO TABS
50.0000 mg | ORAL_TABLET | Freq: Once | ORAL | Status: AC
  Administered 2015-02-14: 50 mg via ORAL
  Filled 2015-02-14 (×2): qty 1

## 2015-02-14 NOTE — Progress Notes (Signed)
Writer was informed by [name] case manger, that pt have f/u appt and med assistance available upon discharge. Pt stated that he can afford a three dollars co-pay for his med with assistance. Pt stated that he is willing to discharge on 02/15/15 if he have the available resources in place.

## 2015-02-14 NOTE — Progress Notes (Signed)
Patient ID: [name], male   DOB: November 30, 1965, 49 y.o.   MRN: [id number] [facility] MD Progress Note  02/14/2015 4:51 PM [name]  MRN:  [id number] Subjective:  He continues to ruminate about his severe psychosocial stressors but seems more focused and spoke about concrete steps he's taking such as making phone calls to find out how he can start the process to get Medicaid reinstituted. He is still concerned about how he will afford medications particularly Xarelto which he is on for history of DVT of left leg. Denies medication side effects.   Objective : I have discussed case with treatment team and have met with patient. Staff reports that patient has been intermittently irritable, angry. Today we have reviewed current symptoms and history. Patient's irritability seems confined to and related to his psychosocial stressors. He does not have a disordered or loose thought process, he is not grandiose, he is not restless, his speech is not pressured. As discussed with staff, efforts are being made to help patient obtain his medication (Xarelto) via assistance card or similar and will  Possibly follow-up at [facility] for medical management. Patient has a series of skin lesions compatible with insect bites that are improving and pruritus which has been quite severe has improved, which is likely contributing to increased irritability. He has a history of heavy smoking, has vascular skin discoloration on legs. We encourage smoking cessation and reviewed likely medical benefits of stopping nicotine/smoking.  Patient encouraged to consider going to a shelter but he states that he prefers to live in the woods (has a tent and a group of friends that live with him). We reviewed the increased likelihood of relapse in this environment/scenario. Patient states that Seroquel (which was started yesterday for insomnia/irritability) is not well tolerated. He states he slept better on Trazodone and currently does not think  Trazodone was causing nightmares. Wants to go back on Trazodone. Today not endorsing cravings.   Principal Problem: Major depressive disorder, recurrent episode, severe Diagnosis:   Patient Active Problem List   Diagnosis Date Noted  . Alcohol abuse with intoxication [F10.129] 02/10/2015  . Alcohol dependence with withdrawal, uncomplicated [V74.827] 07/86/7544  . Substance induced mood disorder [F19.94] 02/09/2015  . Suicidal ideations [R45.851] 02/09/2015  . Alcohol dependence with alcohol-induced mood disorder [F10.24]   . Suicidal ideation [R45.851]   . GAD (generalized anxiety disorder) [F41.1] 09/18/2014  . Recurrent major depression-severe [F33.2] 09/18/2014  . Alcohol abuse with alcohol-induced mood disorder [F10.14] 09/17/2014  . Alcohol intoxication [F10.129]   . DVT, lower extremity [I82.409]   . Left leg DVT [I82.402] 06/06/2014  . Alcohol dependence [F10.20] 01/27/2014  . Upper leg DVT (deep venous thromboembolism), acute [I82.4Y9] 06/12/2013  . Homeless single person [Z59.0] 06/12/2013  . 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 Cellulitis of leg, left [L03.116] 01/03/2013  . Subtherapeutic international normalized ratio (INR) [R79.1] 12/18/2012  . Alcohol withdrawal [F10.239] 11/03/2012  . Major depressive disorder, recurrent episode, severe [F33.2] 11/03/2012  . Coumadin toxicity [T60.4X1A] 05/20/2012  . Hematuria [R31.9] 05/20/2012  . Deep vein thrombosis [I82.409] 04/13/2012  . DVT of lower limb, acute [I82.409] 03/28/2012  . 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 DVT of lower extremity, bilateral [I82.403] 01/28/2012  . Anemia [D64.9] 01/28/2012  . Degenerative joint disease [M19.90]   . Alcohol abuse [F10.10] 03/01/2011  . Tobacco abuse [Z72.0] 03/01/2011   Total Time spent with patient: 25 minutes   Past Medical History:  Past Medical History  Diagnosis Date  .  Coronary artery disease   . Degenerative joint disease of spine   . Pulmonary embolism 06/2010  . Degenerative joint disease   . 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 ETOH abuse   . DVT (deep venous thrombosis)   . Hepatitis B   . Mental disorder   . Depression     Past Surgical History  Procedure Laterality Date  . Left elbow surgery    . Tonsillectomy     Family History:  Family History  Problem Relation Age of Onset  . Cancer Mother     Ovarian   Social History:  History  Alcohol Use  . 14.4 oz/week  . 24 Cans of beer per week    Comment: cas of beer daily     History  Drug Use No    History   Social History  . Marital Status: Legally Separated    Spouse Name: N/A  . Number of Children: N/A  . Years of Education: N/A   Occupational History  . Homeless   .     Social History Main Topics  . Smoking status: Current Every Day Smoker -- 2.00 packs/day for 30 years    Types: Cigarettes  . Smokeless tobacco: Never Used  . Alcohol Use: 14.4 oz/week    24 Cans of beer per week     Comment: cas of beer daily  . Drug Use: No  . Sexual Activity: No   Other Topics Concern  . None   Social History Narrative   Estranged from family.  Homeless.     Additional History:    Sleep: reported as improved but patient states that he actually slept better on Trazodone and wants to go back on it  Appetite:  Fair   Assessment:   Musculoskeletal: Strength & Muscle Tone: within normal limits Gait & Station: normal Patient leans: N/A   Psychiatric Specialty Exam: Physical Exam  ROS- Pruritis/itching  Blood pressure 104/70, pulse 101, temperature 97.7 F (36.5 C), temperature source Oral, resp. rate 16, height 6' 2"  (1.88 m), weight 205 lb (92.987 kg).Body mass index is 26.31 kg/(m^2).  General Appearance: Fairly Groomed  Engineer, water::  Good  Speech:  Slow  Volume:  Normal  Mood:  Depression is improving  Affect:  Less irritable  Thought Process:  Goal  Directed and Linear  Orientation:  Other:  fully alert and attentive  Thought Content:  denies hallucinations, no delusions expressed, foucsed on stressors as above   Suicidal Thoughts:  No -  able to contract for safety on the unit at this time  Homicidal Thoughts:  No  Memory:  Recent and Remote grossly intact  Judgement:  Fair  Insight:  Present  Psychomotor Activity:  Normal  Concentration:  Good  Recall:  Good  Fund of Knowledge:Good  Language: Good  Akathisia:  Negative  Handed:  Right  AIMS (if indicated):     Assets:  Desire for Improvement Resilience  ADL's:  Impaired  Cognition: WNL  Sleep:  Number of Hours: 6.75     Current Medications: Current Facility-Administered Medications  Medication Dose Route Frequency Provider Last Rate Last Dose  . acamprosate (CAMPRAL) tablet 666 mg  666 mg Oral TID WC Patrecia Pour, NP   666 mg at 02/14/15 1207  . acetaminophen (TYLENOL) tablet 650 mg  650 mg Oral Q6H PRN Patrecia Pour, NP   650 mg at 02/14/15 1209  . alum & mag  hydroxide-simeth (MAALOX/MYLANTA) 200-200-20 MG/5ML suspension 30 mL  30 mL Oral Q4H PRN Patrecia Pour, NP      . citalopram (CELEXA) tablet 20 mg  20 mg Oral Daily Patrecia Pour, NP   20 mg at 02/14/15 0803  . doxycycline (VIBRA-TABS) tablet 100 mg  100 mg Oral Q12H Laverle Hobby, PA-C   100 mg at 02/14/15 9509  . hydrOXYzine (ATARAX/VISTARIL) tablet 50 mg  50 mg Oral Q6H PRN Laverle Hobby, PA-C   50 mg at 02/14/15 0802  . magnesium hydroxide (MILK OF MAGNESIA) suspension 30 mL  30 mL Oral Daily PRN Patrecia Pour, NP      . multivitamin with minerals tablet 1 tablet  1 tablet Oral Daily Patrecia Pour, NP   1 tablet at 02/14/15 1207  . nicotine (NICODERM CQ - dosed in mg/24 hours) patch 21 mg  21 mg Transdermal Q0600 Patrecia Pour, NP   21 mg at 02/14/15 0845  . QUEtiapine (SEROQUEL) tablet 25 mg  25 mg Oral QHS Jenne Campus, MD   25 mg at 02/13/15 2223  . rivaroxaban (XARELTO) tablet 20 mg  20 mg  Oral Q supper Patrecia Pour, NP   20 mg at 02/13/15 1646  . thiamine (VITAMIN B-1) tablet 100 mg  100 mg Oral Daily Patrecia Pour, NP   100 mg at 02/14/15 3267  . ziprasidone (GEODON) injection 20 mg  20 mg Intramuscular Once Patrecia Pour, NP   20 mg at 02/09/15 2330    Lab Results: No results found for this or any previous visit (from the past 48 hour(s)).  Physical Findings: AIMS: Facial and Oral Movements Muscles of Facial Expression: None, normal Lips and Perioral Area: None, normal Jaw: None, normal Tongue: None, normal,Extremity Movements Upper (arms, wrists, hands, fingers): None, normal Lower (legs, knees, ankles, toes): None, normal, Trunk Movements Neck, shoulders, hips: None, normal, Overall Severity Severity of abnormal movements (highest score from questions above): None, normal Incapacitation due to abnormal movements: None, normal Patient's awareness of abnormal movements (rate only patient's report): No Awareness, Dental Status Current problems with teeth and/or dentures?: No Does patient usually wear dentures?: No  CIWA:  CIWA-Ar Total: 15 COWS:      Assessment- Patient remains focused on and worried about stressors such as having lost Medicaid services. However today he's less irritable and more focused on tangible steps to work on obtaining these benefits back. At this time, there is no evidence of mania or hypomania and irritability seems circumscribed to stressors. Did not tolerate Seroquel well.  Skin lesions/pruritus improving.   Treatment Plan Summary: Daily contact with patient to assess and evaluate symptoms and progress in treatment, Medication management, Plan conitnue inpatient treatment  and continue medications as below  Xarelto for history of DVT. Medication has been well tolerated. Staff is currently working on helping him obtain Xarelto prescription assistance. Celexa 20 mgrs QDAY for Depression and Anxiety Campral 666 mgrs TID for management of  alcohol cravings  Restart trazodone 200 mg QHS to address insomnia. D/C Seroquel d/t poorly tolerated. Doxycycline for identified tick bite. Has completed Permethrin application for scabies. No evidence of active scabies infection at this time and pruritus decreasing. Regarding alcohol dependence, continue to encourage patient to consider going to a rehab and participating in [place] after discharge. CSW is working on applications - report: patient has been refused by [facility] but [facility] referral pending. I'm concerned that should he return to his current situation  of homelessness his relapse risk is very high as he admits that he lives with other people who drink heavily.     Medical Decision Making:  Established Problem, Stable/Improving (1), Review of Psycho-Social Stressors (1), Review or order clinical lab tests (1) and Review of Medication Regimen & Side Effects (2)     COBOS, FERNANDO 02/14/2015, 4:51 PM

## 2015-02-14 NOTE — BHH Group Notes (Signed)
[place] LCSW Group Therapy Group Therapy:  [facility]  1:15 - 2: 30 PM   02/14/2015   Type of Therapy: Group Therapy  Participation Level: Appropriate  Participation Quality: Appropriate  Affect:Calm, sat with eyes closed  Cognitive: Appropriate  Insight: Developing/Improving   Engagement in Therapy: Developing/Improving   Modes of Intervention: Discussion Exploration Problem-Solving Supportive  Summary of Progress/Problems: [facility] Speaker came to talk about his personal journey with substance abuse and addiction. The pt processed ways by which to relate to the speaker. [place] speaker provided handouts and educational information pertaining to groups and services offered by the [facility]. Sat quietly, appeared to listen to speaker.  Edwyna Shell, LCSW Clinical Social Worker 02/14/2015 2:03 PM

## 2015-02-14 NOTE — Progress Notes (Signed)
D: Pt presents with flat affect and depressed. Pt irritable on approach. Pt reported that he slept three hours last night. Pt endorses suicidal thoughts with no plan if he's not able to get his medicaid benefits. Pt verbally contracts for safety. Pt rates depression 8/10. Hopeless 8/10. Anxiety 5/10. Withdrawal symptoms of runny nose and irritability. Pt reported that the Seroquel did not help him sleep well last night. Pt compliant with taking meds and attending groups. Pt requested vistaril for anxiety. Medications given at pt request.  A:  Medications administered as ordered per MD. Verbal support given. Pt encouraged to attend groups. 15 minute checks performed for safety. Pt contracts not to harm self.  R: Pt stated goal for today "nothing".

## 2015-02-14 NOTE — Progress Notes (Signed)
Pt attended Karaoke group tonight.  [name] Taussig H.  02/14/2015 11:27PM 

## 2015-02-15 ENCOUNTER — Encounter (HOSPITAL_COMMUNITY): Payer: Self-pay | Admitting: Registered Nurse

## 2015-02-15 DIAGNOSIS — F332 Major depressive disorder, recurrent severe without psychotic features: Secondary | ICD-10-CM | POA: Insufficient documentation

## 2015-02-15 MED ORDER — CITALOPRAM HYDROBROMIDE 20 MG PO TABS: 20.0000 mg | ORAL_TABLET | Freq: Every day | ORAL | Status: DC

## 2015-02-15 MED ORDER — HYDROXYZINE HCL 50 MG PO TABS: 50.0000 mg | ORAL_TABLET | Freq: Three times a day (TID) | ORAL | Status: DC | PRN

## 2015-02-15 MED ORDER — RIVAROXABAN 20 MG PO TABS: 20.0000 mg | ORAL_TABLET | Freq: Every day | ORAL | Status: DC

## 2015-02-15 MED ORDER — HYDROXYZINE HCL 50 MG PO TABS
50.0000 mg | ORAL_TABLET | Freq: Three times a day (TID) | ORAL | Status: DC | PRN
  Filled 2015-02-15: qty 20

## 2015-02-15 MED ORDER — ACAMPROSATE CALCIUM 333 MG PO TBEC: 666.0000 mg | DELAYED_RELEASE_TABLET | Freq: Three times a day (TID) | ORAL | Status: DC

## 2015-02-15 MED ORDER — DOXYCYCLINE HYCLATE 100 MG PO TABS: 100.0000 mg | ORAL_TABLET | Freq: Two times a day (BID) | ORAL | Status: AC

## 2015-02-15 MED ORDER — ADULT MULTIVITAMIN W/MINERALS CH: 1.0000 | ORAL_TABLET | Freq: Every day | ORAL | Status: DC

## 2015-02-15 MED ORDER — NICOTINE 21 MG/24HR TD PT24: 21.0000 mg | MEDICATED_PATCH | Freq: Every day | TRANSDERMAL | Status: DC

## 2015-02-15 NOTE — Progress Notes (Signed)
  [facility] Adult Case Management Discharge Plan :  Will you be returning to the same living situation after discharge:  No. Patient will discharge to [facility] At discharge, do you have transportation home?: Yes,  Patient will be provided with train ticket Do you have the ability to pay for your medications: Yes,  patient provided with medication samples and prescriptions at discharge  Release of information consent forms completed and in the chart;  Patient's signature needed at discharge.  Patient to Follow up at: Follow-up Information    Follow up with [facility]  On 02/18/2015.   Why:  Walk-in clinic on Monday June 13th between 9 am to 4 pm for assessment for therapy and medication management services.    Contact information:   [address] [place] [address] [phone]      Patient denies SI/HI: Yes,  denies    Safety Planning and Suicide Prevention discussed: Yes,  with patient   Have you used any form of tobacco in the last 30 days? (Cigarettes, Smokeless Tobacco, Cigars, and/or Pipes): Yes  Has patient been referred to the Quitline?: Patient refused referral  Adyn Hoes, Casimiro Needle 02/15/2015, 3:08 PM

## 2015-02-15 NOTE — Progress Notes (Signed)
Recreation Therapy Notes  Date: 06.10.16 Time: 9:30 am Location: [address] Group Room  Group Topic: Stress Management  Goal Area(s) Addresses:  Patient will verbalize importance of using healthy stress management.  Patient will identify positive emotions associated with healthy stress management.   Intervention: Stress Management  Activity :  Progressive Muscle Relaxation.  LRT introduced and educated patients on stress management technique of progressive muscle relaxation.  [name] script was used to deliver the technique to patients.  Patients were asked to follow script read [name] loud by LRT to engage in practicing the stress management technique.  Education:  Stress Management, Discharge Planning.   Clinical Observations/Feedback: Patient did not attend group.   [name], LRT/CTRS         [name] 02/15/2015 3:23 PM 

## 2015-02-15 NOTE — Progress Notes (Signed)
Patient ID: [name], male   DOB: [birth year], 49 y.o.   MRN: [id number] D-Initially at am med pass requested Tylenol for a headache of 8 and muscle aches as well as requested Vistaril for itching. Both were given. Completed self inventory and gave a 5 for his feelings of depression, anxeity, and feelings of hopelessness. He also wants to make sure he will be getting his Trazadone tonight. Informed Dr. Berneice Gandy his want in treatment meeting.  A-Possible discharge today, he would like to go to [facility] from here. Referred to SW and Dr. Donivan Scull this want.Support offered. Monitored for safety, and medications as offered. R-No complaint other than pain and itchiness. He is relatively pleasant and verbal. He denies any thoughts to hurt self or others.

## 2015-02-15 NOTE — Progress Notes (Signed)
Adult Psychoeducational Group Note  Date:  02/15/2015 Time:  10:00AM  Group Topic/Focus:  Making Healthy Choices:   The focus of this group is to help patients identify negative/unhealthy choices they were using prior to admission and identify positive/healthier coping strategies to replace them upon discharge.  Participation Level:  Did Not Attend  Additional Comments:  Pt did not attend group. Pt was in his room lying in the bed during group time   Cheston Coury K 02/15/2015, 10:45 AM

## 2015-02-15 NOTE — Progress Notes (Signed)
Patient ID: [name], male   DOB: 1965-09-21, 49 y.o.   MRN: [id number] Discharge Note-Arrangements made with Dr. And social worker for him to go from here to the [facility].Reviewed with him his discharge plan, his discharge medications, returned all property and gave him his bus ticket to get to [place] reviewed with him the specific information for [facility] and gave him 2.00 and bus ticket to get downtown, and then she had a train ticket for him from there. He was given a voucher for Gwenyth Bouillon to take across the street for a free 30 day supply and walked up the stairs to show him where he was going for the medicine. He was given mediations from the pharmacy for several days in addition to his Rx's.He states he is a little apprehensive about going in case they wont take him and he gets stuck there. Reassured it will work out because it has already been arranged by the SW. Denies any thoughts to hurt self or others, and is not psychotic.

## 2015-02-15 NOTE — Tx Team (Signed)
Interdisciplinary Treatment Plan Update (Adult)  Date:  02/15/2015  Time Reviewed:  9:30 am   Progress in Treatment: Attending groups: Patient is attending groups. Participating in groups:  Patient engages in discussion Taking medication as prescribed:  Patient is taking medications Tolerating medication:  Patient is tolerating medications Family/Significant othe contact made:   No, patient declined collateral contact Patient understands diagnosis:Yes, patient understands diagnosis and need for treatment Discussing patient identified problems/goals with staff:  Yes, patient is able to express goals/problems Medical problems stabilized or resolved:  Yes Denies suicidal/homicidal ideation: Yes, patient is denying SI/HI. Issues/concerns per patient self-inventory:   Other:  Discharge Plan or Barriers:  6/7: Referral made to [facility] for residential treatment  6/10: Patient plans to discharge today to the [facility] and will follow up with outpatient resources.  Reason for Continuation of Hospitalization: Anxiety Depression Medication stabilization Suicidal ideation  Comments:  [name] is a 49 year old caucasian male. Disheveled. Admitted from the [facility] with complaints of suicidal ideations. He states, "The ambulance took me to the hospital 2 days ago. A friend called 911 because I was getting ready to take an overdose of medicines. I was thinking about killing myself for about 1 week. I got Lupus & blood clot to my bones. But, I can't get medicaid to pay for my medicines. I was told by the [employer] that I was permanently terminated from the [employer]. No reason was given to me. They said I lost my medicaid because I did not go by the rules.Without my medicine Xarelto, I will die any way. I don't want to die from the horrible pain that the blood clot can generate on a human body. I just want to talk to someone; like the social worker to see  how I can get my medicaid back. I have been denied disability x 2.  Additional comments:  Patient and CSW reviewed Patient Discharge Process Letter/Patient Involvement Form.  Patient verbalized understanding and signed form.  Patient and CSW also reviewed and identified patient's goals and treatment plan.  Patient verbalized understanding and agreed to plan.  Estimated length of stay:  Discharge anticipated for today 6/10.  New goal(s):  Review of initial/current patient goals per problem list:  Please see plan of careInterdisciplinary Treatment Plan Update (Adult)  Attendees: Patient 02/15/2015 8:22 AM   Family:  02/15/2015 8:22 AM   Physician: Neita Garnet, MD 02/15/2015 8:22 AM   Nursing: Sumner Boast RN 02/15/2015 8:22 AM   Clinical Social Worker: Peri Maris, [place]  02/15/2015 8:22 AM   Clinical Social Worker: Tilden Fossa, LCSW-A 02/15/2015 8:22 AM   Case Manager: Lars Pinks, RN 02/15/2015 8:22 AM   Other: Lucinda Dell, [place] Liaison  02/15/2015 8:22 AM  Other:  02/15/2015 8:22 AM   Other:  02/15/2015 8:22 AM   Other:  02/15/2015 8:22 AM   Other:             Tilden Fossa, MSW, [place] Clinical Social Worker [facility] [phone]

## 2015-02-15 NOTE — BHH Group Notes (Signed)
   [facility] LCSW Aftercare Discharge Planning Group Note  02/15/2015  8:45 AM   Participation Quality: Alert, Appropriate and Oriented  Mood/Affect: Appropriate  Depression Rating: 5  Anxiety Rating: 5  Thoughts of Suicide: Pt denies SI/HI  Will you contract for safety? Yes  Current AVH: Pt denies  Plan for Discharge/Comments: Pt attended discharge planning group and actively participated in group. CSW provided pt with today's workbook. Patient expresses interest in [facility] at this time.  Transportation Means: Pt reports access to transportation  Supports: No supports mentioned at this time  Tilden Fossa, MSW, [place] Social Worker [employer] [phone]

## 2015-02-15 NOTE — Discharge Summary (Signed)
Physician Discharge Summary Note  Patient:  [name] is an 49 y.o., male MRN:  [id number] DOB:  Nov 18, 1965 Patient phone:  [phone] (home)  Patient address:   [place] [address],  Total Time spent with patient: greater than 30 minutes  Date of Admission:  02/09/2015 Date of Discharge: 02/15/2015  Reason for Admission:  Per H&P Admission:  [name] is a 49 year old caucasian male. Disheveled. Admitted from the [facility] with complaints of suicidal ideations. He states, "The ambulance took me to the hospital 2 days ago. A friend called 911 because I was getting ready to take an overdose of medicines. I was thinking about killing myself for about 1 week. I got Lupus & blood clot to my bones. But, I can't get medicaid to pay for my medicines. I was told by the [place] that I was permanently terminated from the [employer]. No reason was given to me. They said I lost my medicaid because I did not go by the rules.Without my medicine Xarelto, I will die any way. I don't want to die from the horrible pain that the blood clot can generate on a human body. I just want to talk to someone; like the social worker to see how I can get my medicaid back. I have been denied disability x 2.  Principal Problem: Major depressive disorder, recurrent episode, severe Discharge Diagnoses: Patient Active Problem List   Diagnosis Date Noted  . Major depressive disorder, recurrent, severe without psychotic features [F33.2]   . Alcohol abuse with intoxication [F10.129] 02/10/2015  . Alcohol dependence with withdrawal, uncomplicated [K93.818] 29/93/7169  . Substance induced mood disorder [F19.94] 02/09/2015  . Suicidal ideations [R45.851] 02/09/2015  . Alcohol dependence with alcohol-induced mood disorder [F10.24]   . Suicidal ideation [R45.851]   . GAD (generalized anxiety disorder) [F41.1] 09/18/2014  . Recurrent major depression-severe [F33.2]  09/18/2014  . Alcohol abuse with alcohol-induced mood disorder [F10.14] 09/17/2014  . Alcohol intoxication [F10.129]   . DVT, lower extremity [I82.409]   . Left leg DVT [I82.402] 06/06/2014  . Alcohol dependence [F10.20] 01/27/2014  . Upper leg DVT (deep venous thromboembolism), acute [I82.4Y9] 06/12/2013  . Homeless single person [Z59.0] 06/12/2013  . 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 Cellulitis of leg, left [L03.116] 01/03/2013  . Subtherapeutic international normalized ratio (INR) [R79.1] 12/18/2012  . Alcohol withdrawal [F10.239] 11/03/2012  . Major depressive disorder, recurrent episode, severe [F33.2] 11/03/2012  . Coumadin toxicity [T60.4X1A] 05/20/2012  . Hematuria [R31.9] 05/20/2012  . Deep vein thrombosis [I82.409] 04/13/2012  . DVT of lower limb, acute [I82.409] 03/28/2012  . 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 DVT of lower extremity, bilateral [I82.403] 01/28/2012  . Anemia [D64.9] 01/28/2012  . Degenerative joint disease [M19.90]   . Alcohol abuse [F10.10] 03/01/2011  . Tobacco abuse [Z72.0] 03/01/2011    Musculoskeletal: Strength & Muscle Tone: within normal limits Gait & Station: normal Patient leans: N/A  Psychiatric Specialty Exam:  See Suicide Risk Assessment Physical Exam  Nursing note reviewed. Constitutional: He is oriented to person, place, and time.  Neck: Normal range of motion.  Respiratory: Effort normal.  Musculoskeletal: Normal range of motion.  Neurological: He is alert and oriented to person, place, and time.    Review of Systems  Constitutional:       History of Lupus anticoagulant disorder, DVT, DJD, pulmonary embolism   Psychiatric/Behavioral: Positive for substance abuse. Negative for  suicidal ideas, hallucinations and memory loss. Depression: stable. Nervous/anxious: Stable. Insomnia: stable.   All other systems reviewed and are  negative.   Blood pressure 87/62, pulse 103, temperature 97.7 F (36.5 C), temperature source Oral, resp. rate 18, height 6\' 2"  (1.88 m), weight 92.987 kg (205 lb).Body mass index is 26.31 kg/(m^2).  Have you used any form of tobacco in the last 30 days? (Cigarettes, Smokeless Tobacco, Cigars, and/or Pipes): Yes  Has this patient used any form of tobacco in the last 30 days? (Cigarettes, Smokeless Tobacco, Cigars, and/or Pipes) Yes, A prescription for an FDA-approved tobacco cessation medication was offered at discharge and the patient refused  Past Medical History:  Past Medical History  Diagnosis Date  . Coronary artery disease   . Degenerative joint disease of spine   . Pulmonary embolism 06/2010  . Degenerative joint disease   . 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 ETOH abuse   . DVT (deep venous thrombosis)   . Hepatitis B   . Mental disorder   . Depression     Past Surgical History  Procedure Laterality Date  . Left elbow surgery    . Tonsillectomy     Family History:  Family History  Problem Relation Age of Onset  . Cancer Mother     Ovarian   Social History:  History  Alcohol Use  . 14.4 oz/week  . 24 Cans of beer per week    Comment: cas of beer daily     History  Drug Use No    History   Social History  . Marital Status: Legally Separated    Spouse Name: N/A  . Number of Children: N/A  . Years of Education: N/A   Occupational History  . Homeless   .     Social History Main Topics  . Smoking status: Current Every Day Smoker -- 2.00 packs/day for 30 years    Types: Cigarettes  . Smokeless tobacco: Never Used  . Alcohol Use: 14.4 oz/week    24 Cans of beer per week     Comment: cas of beer daily  . Drug Use: No  . Sexual Activity: No   Other Topics Concern  . None   Social History Narrative   Estranged from family.  Homeless.      Risk to Self: Is patient at risk for suicide?: Yes Risk to Others:   Prior Inpatient Therapy:   Prior  Outpatient Therapy:    Level of Care:  OP  Hospital Course:  [name] was admitted for Major depressive disorder, recurrent episode, severe and crisis management.  He was treated discharged with the medications listed below under Medication List.  Medical problems were identified and treated as needed.  Home medications were restarted as appropriate.  Improvement was monitored by observation and [name] daily report of symptom reduction.  Emotional and mental status was monitored by daily self-inventory reports completed by [name] and clinical staff.         [name] was evaluated by the treatment team for stability and plans for continued recovery upon discharge.  [name] motivation was an integral factor for scheduling further treatment.  Employment, transportation, bed availability, health status, family support, and any pending legal issues were also considered during his hospital stay.  He was offered further treatment options upon discharge including but not limited to Residential, Intensive Outpatient, and Outpatient treatment.  [name] will follow up with the services as listed below under Follow Up  Information.     Upon completion of this admission the patient was both mentally and medically stable for discharge denying suicidal/homicidal ideation, auditory/visual/tactile hallucinations, delusional thoughts and paranoia.      Consults:  psychiatry  Significant Diagnostic Studies:  labs: UDS, ETOH, CMET, CBC/Diff, Protime-INR  Discharge Vitals:   Blood pressure 87/62, pulse 103, temperature 97.7 F (36.5 C), temperature source Oral, resp. rate 18, height 6\' 2"  (1.88 m), weight 92.987 kg (205 lb). Body mass index is 26.31 kg/(m^2). Lab Results:   No results found for this or any previous visit (from the past 72 hour(s)).  Physical Findings: AIMS: Facial and Oral Movements Muscles of Facial Expression: None, normal Lips and Perioral Area: None, normal Jaw:  None, normal Tongue: None, normal,Extremity Movements Upper (arms, wrists, hands, fingers): None, normal Lower (legs, knees, ankles, toes): None, normal, Trunk Movements Neck, shoulders, hips: None, normal, Overall Severity Severity of abnormal movements (highest score from questions above): None, normal Incapacitation due to abnormal movements: None, normal Patient's awareness of abnormal movements (rate only patient's report): No Awareness, Dental Status Current problems with teeth and/or dentures?: No Does patient usually wear dentures?: No  CIWA:  CIWA-Ar Total: 15 COWS:      See Psychiatric Specialty Exam and Suicide Risk Assessment completed by Attending Physician prior to discharge.  Discharge destination:  Home  Is patient on multiple antipsychotic therapies at discharge:  No   Has Patient had three or more failed trials of antipsychotic monotherapy by history:  No    Recommended Plan for Multiple Antipsychotic Therapies: NA      Discharge Instructions    Activity as tolerated - No restrictions    Complete by:  As directed      Diet general    Complete by:  As directed      Discharge instructions    Complete by:  As directed   Take all of you medications as prescribed by your mental healthcare provider.  Report any adverse effects and reactions from your medications to your outpatient provider promptly. Do not engage in alcohol and or illegal drug use while on prescription medicines. In the event of worsening symptoms call the crisis hotline, 911, and or go to the nearest emergency department for appropriate evaluation and treatment of symptoms. Follow-up with your primary care provider for your medical issues, concerns and or health care needs.   Keep all scheduled appointments.  If you are unable to keep an appointment call to reschedule.  Let the nurse know if you will need medications before next scheduled appointment.            Medication List    STOP  taking these medications        traZODone 100 MG tablet  Commonly known as:  DESYREL      TAKE these medications      Indication   acamprosate 333 MG tablet  Commonly known as:  CAMPRAL  Take 2 tablets (666 mg total) by mouth 3 (three) times daily with meals. For alcoholism   Indication:  Excessive Use of Alcohol     citalopram 20 MG tablet  Commonly known as:  CELEXA  Take 1 tablet (20 mg total) by mouth daily. For depression   Indication:  Depression     doxycycline 100 MG tablet  Commonly known as:  VIBRA-TABS  Take 1 tablet (100 mg total) by mouth every 12 (twelve) hours.   Indication:  Infection     hydrOXYzine 50 MG tablet  Commonly known as:  ATARAX/VISTARIL  Take 1 tablet (50 mg total) by mouth 3 (three) times daily as needed for anxiety or itching.   Indication:  Anxiety Neurosis, Tension, Anxiety     multivitamin with minerals Tabs tablet  Take 1 tablet by mouth daily.   Indication:  Vitamin Supplementation     nicotine 21 mg/24hr patch  Commonly known as:  NICODERM CQ - dosed in mg/24 hours  Place 1 patch (21 mg total) onto the skin daily at 6 (six) AM. For nicotine addiction   Indication:  Nicotine Addiction     rivaroxaban 20 MG Tabs tablet  Commonly known as:  XARELTO  Take 1 tablet (20 mg total) by mouth daily with supper.   Indication:  Blood Clot in a Blood Vessel of the Lung, Anticoagulant disorder       Follow-up Information    Follow up with [facility]    . Go on 02/27/2015.   Why:  AT 900 FOR A FOLLOW UP APPT   Contact information:   [place] [address] [phone]      Follow up with [facility]    . Go on 03/14/2015.   Why:  AT 9:30 FOR A FINANCIAL ASSISTANCE APPT, PLEASE TAKE THE ORANGE CARD APPLICATION   Contact information:   [address] [place] [address] [phone]      Follow-up recommendations:  Activity:   As tolerated Diet:  As tolerated  Comments:   Patient has been instructed to take medications as prescribed; and report adverse effects to outpatient provider.  Follow up with primary doctor for any medical issues and If symptoms recur report to nearest emergency or crisis hot line.    Total Discharge Time: Greater than 30 minutes  Signed: Earleen Newport, FNP-BC 02/15/2015, 2:18 PM   Patient seen, Suicide Assessment Completed.  Disposition Plan Reviewed

## 2015-02-15 NOTE — [facility] Suicide Risk Assessment (Signed)
[facility] Discharge Suicide Risk Assessment   Demographic Factors:  49 year old man, separated, homeless   Total Time spent with patient: 30 minutes  Musculoskeletal: Strength & Muscle Tone: within normal limits Gait & Station: normal Patient leans: N/A  Psychiatric Specialty Exam: Physical Exam  ROS  Blood pressure 87/62, pulse 103, temperature 97.7 F (36.5 C), temperature source Oral, resp. rate 18, height 6\' 2"  (1.88 m), weight 205 lb (92.987 kg).Body mass index is 26.31 kg/(m^2).  General Appearance: Well Groomed  Engineer, water::  Negative  Speech:  Normal Rate  Volume:  Normal  Mood:  improved   Affect:  appropriate- less irritable   Thought Process:  Linear  Orientation:  Full (Time, Place, and Person)  Thought Content: linear   Suicidal Thoughts:  No- at this time denies any thoughts of hurting self or anyone else   Homicidal Thoughts:  No  Memory:  recent and remote grossly intact   Judgement:  Other:  improved   Insight:  improved  Psychomotor Activity:  Normal  Concentration:  Good  Recall:  Good  Fund of Knowledge:Good  Language: Good  Akathisia:  Negative  Handed:  Right  AIMS (if indicated):     Assets:  Desire for Improvement Resilience  Sleep:  Number of Hours: 6.75  Cognition: WNL  ADL's: improved    Have you used any form of tobacco in the last 30 days? (Cigarettes, Smokeless Tobacco, Cigars, and/or Pipes): Yes  Has this patient used any form of tobacco in the last 30 days? (Cigarettes, Smokeless Tobacco, Cigars, and/or Pipes) Yes, A prescription for an FDA-approved tobacco cessation medication was offered at discharge and the patient refused  Mental Status Per Nursing Assessment::   On Admission:     Current Mental Status by Physician: Alert and attentive,  Better related, less irritable, mood improved, affect appropriate, no thought disorder, no SI , no HI, no psychotic symptoms, future oriented .   Loss Factors: Homelessness, loss of Medicaid  services recently  Historical Factors: Long history of alcohol dependence, prior psychiatric admissions for alcohol detox and depression  Risk Reduction Factors:   Resilience  Continued Clinical Symptoms:  As noted, at this time patient is improved compared to admission- does not have any symptoms of WDL at this time. Mood is improved and affect is more reactive and less irritable, no thought disorder, no SI or HI, no psychotic symptoms. Patient  Was also seen with CSW- states he is not interested in a Shelter, and [place] " [place] woods" ( has been living in a tent with friends) . We reviewed the high risk of relapse in this scenario, we discussed other options and he did express agreement and interest in going to [facility].  Cognitive Features That Contribute To Risk:  No gross cognitive deficits noted upon discharge. Is alert , attentive, and oriented x 3   Suicide Risk:  Mild:  Suicidal ideation of limited frequency, intensity, duration, and specificity.  There are no identifiable plans, no associated intent, mild dysphoria and related symptoms, good self-control (both objective and subjective assessment), few other risk factors, and identifiable protective factors, including available and accessible social support.  Principal Problem: Major depressive disorder, recurrent episode, severe Discharge Diagnoses:  Patient Active Problem List   Diagnosis Date Noted  . Alcohol abuse with intoxication [F10.129] 02/10/2015  . Alcohol dependence with withdrawal, uncomplicated [L93.570] 17/79/3903  . Substance induced mood disorder [F19.94] 02/09/2015  . Suicidal ideations [R45.851] 02/09/2015  .  Alcohol dependence with alcohol-induced mood disorder [F10.24]   . Suicidal ideation [R45.851]   . GAD (generalized anxiety disorder) [F41.1] 09/18/2014  . Recurrent major depression-severe [F33.2] 09/18/2014  . Alcohol abuse with alcohol-induced mood disorder [F10.14] 09/17/2014   . Alcohol intoxication [F10.129]   . DVT, lower extremity [I82.409]   . Left leg DVT [I82.402] 06/06/2014  . Alcohol dependence [F10.20] 01/27/2014  . Upper leg DVT (deep venous thromboembolism), acute [I82.4Y9] 06/12/2013  . Homeless single person [Z59.0] 06/12/2013  . 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 Cellulitis of leg, left [L03.116] 01/03/2013  . Subtherapeutic international normalized ratio (INR) [R79.1] 12/18/2012  . Alcohol withdrawal [F10.239] 11/03/2012  . Major depressive disorder, recurrent episode, severe [F33.2] 11/03/2012  . Coumadin toxicity [T60.4X1A] 05/20/2012  . Hematuria [R31.9] 05/20/2012  . Deep vein thrombosis [I82.409] 04/13/2012  . DVT of lower limb, acute [I82.409] 03/28/2012  . 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 DVT of lower extremity, bilateral [I82.403] 01/28/2012  . Anemia [D64.9] 01/28/2012  . Degenerative joint disease [M19.90]   . Alcohol abuse [F10.10] 03/01/2011  . Tobacco abuse [Z72.0] 03/01/2011      Plan Of Care/Follow-up recommendations:  Activity:  as tolerated Diet:  Regular Tests:  NA Other:  See below  Is patient on multiple antipsychotic therapies at discharge:  No   Has Patient had three or more failed trials of antipsychotic monotherapy by history:  No  Recommended Plan for Multiple Antipsychotic Therapies: NA  Patient is leaving in good spirits. Plans to go to [facility]. He will go to [facility] in [place] in order to continue medical management  For his history of DVT   [name] 02/15/2015, 10:49 AM

## 2015-02-15 NOTE — Progress Notes (Signed)
D Pt.  Denies SI and HI, no complaints of pain or discomfort noted.  Pt. Does complain of insomnia.  A Writer offered support and encouragement, Discussed medications with pt.  R Pt. Was angry when he inquired about his HS medications.  Pt. Reports his MD promised him 200 mg. Of trazodone for sleep tonight, pt. Was yelling and refused all his meds.  Writer was given a one time doseof trazodone for this HS which satisfied and calmed the pt.  Pt. Remains safe on the unit

## 2015-02-21 NOTE — Clinical Social Work Note (Signed)
Patient has care coordinator, [name], from [facility] [phone]).  Edwyna Shell, LCSW Clinical Social Worker

## 2015-02-27 ENCOUNTER — Inpatient Hospital Stay: Payer: Self-pay | Admitting: Internal Medicine

## 2015-02-28 ENCOUNTER — Emergency Department (HOSPITAL_COMMUNITY)
Admission: EM | Admit: 2015-02-28 | Discharge: 2015-02-28 | Disposition: A | Discharge status: Home / Self Care | Payer: Medicaid Other | Attending: Emergency Medicine | Admitting: Emergency Medicine

## 2015-02-28 ENCOUNTER — Encounter (HOSPITAL_COMMUNITY): Payer: Self-pay | Admitting: Emergency Medicine

## 2015-02-28 ENCOUNTER — Emergency Department (HOSPITAL_COMMUNITY): Payer: Medicaid Other

## 2015-02-28 DIAGNOSIS — Y9389 Activity, other specified: Secondary | ICD-10-CM | POA: Insufficient documentation

## 2015-02-28 DIAGNOSIS — Z86718 Personal history of other venous thrombosis and embolism: Secondary | ICD-10-CM | POA: Insufficient documentation

## 2015-02-28 DIAGNOSIS — Y929 Unspecified place or not applicable: Secondary | ICD-10-CM | POA: Insufficient documentation

## 2015-02-28 DIAGNOSIS — F329 Major depressive disorder, single episode, unspecified: Secondary | ICD-10-CM | POA: Insufficient documentation

## 2015-02-28 DIAGNOSIS — S41011A Laceration without foreign body of right shoulder, initial encounter: Secondary | ICD-10-CM | POA: Insufficient documentation

## 2015-02-28 DIAGNOSIS — Z720 Tobacco use: Secondary | ICD-10-CM | POA: Insufficient documentation

## 2015-02-28 DIAGNOSIS — Z862 Personal history of diseases of the blood and blood-forming organs and certain disorders involving the immune mechanism: Secondary | ICD-10-CM | POA: Insufficient documentation

## 2015-02-28 DIAGNOSIS — Z8739 Personal history of other diseases of the musculoskeletal system and connective tissue: Secondary | ICD-10-CM | POA: Insufficient documentation

## 2015-02-28 DIAGNOSIS — I2510 Atherosclerotic heart disease of native coronary artery without angina pectoris: Secondary | ICD-10-CM | POA: Insufficient documentation

## 2015-02-28 DIAGNOSIS — Z79899 Other long term (current) drug therapy: Secondary | ICD-10-CM | POA: Insufficient documentation

## 2015-02-28 DIAGNOSIS — Z86711 Personal history of pulmonary embolism: Secondary | ICD-10-CM | POA: Insufficient documentation

## 2015-02-28 DIAGNOSIS — Z7901 Long term (current) use of anticoagulants: Secondary | ICD-10-CM | POA: Insufficient documentation

## 2015-02-28 DIAGNOSIS — IMO0002 Reserved for concepts with insufficient information to code with codable children: Secondary | ICD-10-CM

## 2015-02-28 DIAGNOSIS — Z8619 Personal history of other infectious and parasitic diseases: Secondary | ICD-10-CM | POA: Insufficient documentation

## 2015-02-28 DIAGNOSIS — Y999 Unspecified external cause status: Secondary | ICD-10-CM | POA: Insufficient documentation

## 2015-02-28 DIAGNOSIS — S61412A Laceration without foreign body of left hand, initial encounter: Secondary | ICD-10-CM | POA: Insufficient documentation

## 2015-02-28 DIAGNOSIS — Z23 Encounter for immunization: Secondary | ICD-10-CM | POA: Insufficient documentation

## 2015-02-28 MED ORDER — CEPHALEXIN 500 MG PO CAPS: 500.0000 mg | ORAL_CAPSULE | Freq: Four times a day (QID) | ORAL | Status: DC

## 2015-02-28 MED ORDER — BACITRACIN ZINC 500 UNIT/GM EX OINT
TOPICAL_OINTMENT | CUTANEOUS | Status: AC
  Filled 2015-02-28: qty 2.7

## 2015-02-28 MED ORDER — TETANUS-DIPHTH-ACELL PERTUSSIS 5-2.5-18.5 LF-MCG/0.5 IM SUSP
0.5000 mL | Freq: Once | INTRAMUSCULAR | Status: AC
  Administered 2015-02-28: 0.5 mL via INTRAMUSCULAR
  Filled 2015-02-28: qty 0.5

## 2015-02-28 MED ORDER — LIDOCAINE-EPINEPHRINE (PF) 2 %-1:200000 IJ SOLN
20.0000 mL | Freq: Once | INTRAMUSCULAR | Status: AC
  Administered 2015-02-28: 20 mL via INTRADERMAL
  Filled 2015-02-28: qty 20
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
## 2015-02-28 NOTE — ED Notes (Signed)
Patient transported to X-ray 
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
## 2015-02-28 NOTE — ED Notes (Addendum)
Pt walking in hallway getting very impatient with waiting. Security and [employer] are in hallway watching pt.

## 2015-02-28 NOTE — Discharge Instructions (Signed)

## 2015-03-07 NOTE — Clinical Social Work Note (Signed)
Patient has care coordinator, [name], at [facility] ([phone]) - states she has been unable to reach him at number provided [phone]).  Edwyna Shell, LCSW Clinical Social Worker

## 2015-03-14 ENCOUNTER — Ambulatory Visit: Payer: Self-pay

## 2015-04-24 ENCOUNTER — Emergency Department (HOSPITAL_COMMUNITY)
Admission: EM | Admit: 2015-04-24 | Discharge: 2015-04-24 | Disposition: A | Discharge status: Home / Self Care | Payer: Medicaid Other

## 2015-04-24 ENCOUNTER — Encounter (HOSPITAL_COMMUNITY): Payer: Self-pay | Admitting: Emergency Medicine

## 2015-04-24 ENCOUNTER — Inpatient Hospital Stay (HOSPITAL_COMMUNITY)
Admission: EM | Admit: 2015-04-24 | Discharge: 2015-04-28 | DRG: 914 | Disposition: A | Discharge status: Home / Self Care | Payer: Medicaid Other | Attending: General Surgery | Admitting: General Surgery

## 2015-04-24 DIAGNOSIS — S36892A Contusion of other intra-abdominal organs, initial encounter: Secondary | ICD-10-CM | POA: Diagnosis present

## 2015-04-24 DIAGNOSIS — F339 Major depressive disorder, recurrent, unspecified: Secondary | ICD-10-CM | POA: Diagnosis present

## 2015-04-24 DIAGNOSIS — Z86718 Personal history of other venous thrombosis and embolism: Secondary | ICD-10-CM

## 2015-04-24 DIAGNOSIS — Z86711 Personal history of pulmonary embolism: Secondary | ICD-10-CM

## 2015-04-24 DIAGNOSIS — D62 Acute posthemorrhagic anemia: Secondary | ICD-10-CM | POA: Diagnosis not present

## 2015-04-24 DIAGNOSIS — I2510 Atherosclerotic heart disease of native coronary artery without angina pectoris: Secondary | ICD-10-CM | POA: Diagnosis present

## 2015-04-24 DIAGNOSIS — F17210 Nicotine dependence, cigarettes, uncomplicated: Secondary | ICD-10-CM | POA: Diagnosis present

## 2015-04-24 DIAGNOSIS — F411 Generalized anxiety disorder: Secondary | ICD-10-CM | POA: Diagnosis present

## 2015-04-24 DIAGNOSIS — B1910 Unspecified viral hepatitis B without hepatic coma: Secondary | ICD-10-CM | POA: Diagnosis present

## 2015-04-24 DIAGNOSIS — F99 Mental disorder, not otherwise specified: Secondary | ICD-10-CM | POA: Diagnosis present

## 2015-04-24 DIAGNOSIS — M1990 Unspecified osteoarthritis, unspecified site: Secondary | ICD-10-CM | POA: Diagnosis present

## 2015-04-24 DIAGNOSIS — D6862 Lupus anticoagulant syndrome: Secondary | ICD-10-CM | POA: Diagnosis present

## 2015-04-24 DIAGNOSIS — F1010 Alcohol abuse, uncomplicated: Secondary | ICD-10-CM | POA: Diagnosis present

## 2015-04-24 DIAGNOSIS — F1020 Alcohol dependence, uncomplicated: Secondary | ICD-10-CM | POA: Diagnosis present

## 2015-04-24 DIAGNOSIS — Z7901 Long term (current) use of anticoagulants: Secondary | ICD-10-CM

## 2015-04-24 DIAGNOSIS — M479 Spondylosis, unspecified: Secondary | ICD-10-CM | POA: Diagnosis present
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
## 2015-04-25 ENCOUNTER — Encounter (HOSPITAL_COMMUNITY): Payer: Self-pay

## 2015-04-25 ENCOUNTER — Emergency Department (HOSPITAL_COMMUNITY): Payer: Medicaid Other

## 2015-04-25 DIAGNOSIS — S36892A Contusion of other intra-abdominal organs, initial encounter: Secondary | ICD-10-CM | POA: Diagnosis present

## 2015-04-25 LAB — CBC
HCT: 39.6 % (ref 39.0–52.0)
HEMATOCRIT: 37.8 % — AB (ref 39.0–52.0)
HEMOGLOBIN: 13 g/dL (ref 13.0–17.0)
Hemoglobin: 13.3 g/dL (ref 13.0–17.0)
MCH: 32.8 pg (ref 26.0–34.0)
MCH: 33.3 pg (ref 26.0–34.0)
MCHC: 34.4 g/dL (ref 30.0–36.0)
MCHC: 33.6 g/dL (ref 30.0–36.0)
MCV: 96.9 fL (ref 78.0–100.0)
MCV: 97.8 fL (ref 78.0–100.0)
Platelets: 236 10*3/uL (ref 150–400)
Platelets: 268 10*3/uL (ref 150–400)
RBC: 3.9 MIL/uL — AB (ref 4.22–5.81)
RBC: 4.05 MIL/uL — ABNORMAL LOW (ref 4.22–5.81)
RDW: 14.9 % (ref 11.5–15.5)
RDW: 14.9 % (ref 11.5–15.5)
WBC: 7.1 10*3/uL (ref 4.0–10.5)
WBC: 7.8 10*3/uL (ref 4.0–10.5)

## 2015-04-25 LAB — LIPASE, BLOOD: LIPASE: 31 U/L (ref 22–51)

## 2015-04-25 LAB — COMPREHENSIVE METABOLIC PANEL
ALBUMIN: 4.2 g/dL (ref 3.5–5.0)
ALT: 22 U/L (ref 17–63)
AST: 33 U/L (ref 15–41)
Alkaline Phosphatase: 87 U/L (ref 38–126)
Anion gap: 10 (ref 5–15)
BUN: 8 mg/dL (ref 6–20)
CHLORIDE: 105 mmol/L (ref 101–111)
CO2: 23 mmol/L (ref 22–32)
CREATININE: 0.67 mg/dL (ref 0.61–1.24)
Calcium: 8.5 mg/dL — ABNORMAL LOW (ref 8.9–10.3)
GFR calc Af Amer: >60 mL/min (ref >60)
Glucose, Bld: 101 mg/dL — ABNORMAL HIGH (ref 65–99)
POTASSIUM: 4 mmol/L (ref 3.5–5.1)
Sodium: 138 mmol/L (ref 135–145)
Total Bilirubin: 0.5 mg/dL (ref 0.3–1.2)
Total Protein: 8 g/dL (ref 6.5–8.1)

## 2015-04-25 LAB — URINALYSIS, ROUTINE W REFLEX MICROSCOPIC
BILIRUBIN URINE: NEGATIVE
Glucose, UA: NEGATIVE mg/dL
Ketones, ur: NEGATIVE mg/dL
Leukocytes, UA: NEGATIVE
NITRITE: NEGATIVE
PH: 5.5 (ref 5.0–8.0)
Protein, ur: 30 mg/dL — AB
Specific Gravity, Urine: 1.017 (ref 1.005–1.030)
Urobilinogen, UA: 0.2 mg/dL (ref 0.0–1.0)

## 2015-04-25 LAB — HEMOGLOBIN AND HEMATOCRIT, BLOOD
HEMATOCRIT: 35.7 % — AB (ref 39.0–52.0)
Hemoglobin: 12.1 g/dL — ABNORMAL LOW (ref 13.0–17.0)

## 2015-04-25 LAB — URINE MICROSCOPIC-ADD ON

## 2015-04-25 MED ORDER — VITAMIN B-1 100 MG PO TABS
100.0000 mg | ORAL_TABLET | Freq: Every day | ORAL | Status: DC
  Administered 2015-04-25 – 2015-04-28 (×4): 100 mg via ORAL
  Filled 2015-04-25 (×5): qty 1

## 2015-04-25 MED ORDER — HEPARIN SODIUM (PORCINE) 5000 UNIT/ML IJ SOLN
5000.0000 [IU] | Freq: Three times a day (TID) | INTRAMUSCULAR | Status: DC
  Administered 2015-04-26 – 2015-04-28 (×7): 5000 [IU] via SUBCUTANEOUS
  Filled 2015-04-25 (×6): qty 1

## 2015-04-25 MED ORDER — SENNOSIDES-DOCUSATE SODIUM 8.6-50 MG PO TABS: 1.0000 | ORAL_TABLET | Freq: Every evening | ORAL | Status: DC | PRN

## 2015-04-25 MED ORDER — ONDANSETRON 4 MG PO TBDP
4.0000 mg | ORAL_TABLET | Freq: Four times a day (QID) | ORAL | Status: DC | PRN
Start: 2015-04-25 — End: 2015-04-28

## 2015-04-25 MED ORDER — ADULT MULTIVITAMIN W/MINERALS CH
1.0000 | ORAL_TABLET | Freq: Every day | ORAL | Status: DC
  Administered 2015-04-25 – 2015-04-28 (×4): 1 via ORAL
  Filled 2015-04-25 (×4): qty 1

## 2015-04-25 MED ORDER — THIAMINE HCL 100 MG/ML IJ SOLN
100.0000 mg | Freq: Every day | INTRAMUSCULAR | Status: DC
  Filled 2015-04-25: qty 2

## 2015-04-25 MED ORDER — IOHEXOL 300 MG/ML  SOLN
100.0000 mL | Freq: Once | INTRAMUSCULAR | Status: AC | PRN
Start: 2015-04-25 — End: 2015-04-25
  Administered 2015-04-25: 100 mL via INTRAVENOUS

## 2015-04-25 MED ORDER — LORAZEPAM 1 MG PO TABS
1.0000 mg | ORAL_TABLET | Freq: Four times a day (QID) | ORAL | Status: AC | PRN
  Administered 2015-04-26: 1 mg via ORAL
  Filled 2015-04-25: qty 1

## 2015-04-25 MED ORDER — ACETAMINOPHEN 650 MG RE SUPP: 650.0000 mg | Freq: Four times a day (QID) | RECTAL | Status: DC | PRN

## 2015-04-25 MED ORDER — LORAZEPAM 2 MG/ML IJ SOLN
1.0000 mg | Freq: Four times a day (QID) | INTRAMUSCULAR | Status: AC | PRN
  Administered 2015-04-25 (×3): 1 mg via INTRAVENOUS
  Filled 2015-04-25 (×3): qty 1

## 2015-04-25 MED ORDER — MORPHINE SULFATE (PF) 2 MG/ML IV SOLN
2.0000 mg | INTRAVENOUS | Status: DC | PRN
  Administered 2015-04-25 – 2015-04-26 (×2): 4 mg via INTRAVENOUS
  Filled 2015-04-25 (×2): qty 2

## 2015-04-25 MED ORDER — KCL IN DEXTROSE-NACL 20-5-0.45 MEQ/L-%-% IV SOLN
INTRAVENOUS | Status: DC
  Administered 2015-04-25: 12:00:00 via INTRAVENOUS
  Administered 2015-04-26: 1 mL via INTRAVENOUS
  Filled 2015-04-25 (×3): qty 1000

## 2015-04-25 MED ORDER — ONDANSETRON HCL 4 MG/2ML IJ SOLN
4.0000 mg | Freq: Four times a day (QID) | INTRAMUSCULAR | Status: DC | PRN
  Administered 2015-04-25: 4 mg via INTRAVENOUS
  Filled 2015-04-25 (×2): qty 2

## 2015-04-25 MED ORDER — HYDROCODONE-ACETAMINOPHEN 5-325 MG PO TABS
1.0000 | ORAL_TABLET | ORAL | Status: DC | PRN
  Administered 2015-04-25: 2 via ORAL
  Filled 2015-04-25: qty 2

## 2015-04-25 MED ORDER — DOCUSATE SODIUM 100 MG PO CAPS
100.0000 mg | ORAL_CAPSULE | Freq: Two times a day (BID) | ORAL | Status: DC
  Administered 2015-04-25 – 2015-04-27 (×6): 100 mg via ORAL
  Filled 2015-04-25 (×6): qty 1

## 2015-04-25 MED ORDER — FOLIC ACID 1 MG PO TABS
1.0000 mg | ORAL_TABLET | Freq: Every day | ORAL | Status: DC
  Administered 2015-04-25 – 2015-04-28 (×4): 1 mg via ORAL
  Filled 2015-04-25 (×4): qty 1

## 2015-04-25 MED ORDER — ACETAMINOPHEN 325 MG PO TABS
650.0000 mg | ORAL_TABLET | Freq: Four times a day (QID) | ORAL | Status: DC | PRN
  Administered 2015-04-25 (×2): 650 mg via ORAL
  Filled 2015-04-25 (×2): qty 2
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
## 2015-04-25 NOTE — ED Notes (Signed)
Pt. Made aware for the need of urine. 

## 2015-04-25 NOTE — ED Notes (Signed)
Patient's CIWA is going up (13 up from 12 at 7:30).  Discussed with EDP Muthersbaugh. Another dose of Ativan, 1 mg IVP, appropriate.

## 2015-04-25 NOTE — Progress Notes (Addendum)
Patient yelling out loud around 2014 stating he was in excruciating pain, patient shaking, extremely anxious, and diaphoretic, patient was sitting on toilet when RN in room and got up and stated he needed to lay down so proceeded to lay on bathroom floor, patient did not fall, stated he was too hot for the bed and needed a cool hard surface to lay on, patient told to stand up and go back into bed or in recliner, patient ambulated back to bed BP 141/118, CIWA score 25, 1 mg IV ativan administered, patient complaining of nausea with some vomiting noted, 4 mg IV zofran administered, patient also stating PO vicodin not working, 4 mg IV morphine administered, will continue to monitor closely.

## 2015-04-25 NOTE — Progress Notes (Signed)
Patient resting in bed at this time, cold cloth placed on head, Dr. Kieth Brightly notified and aware of evenings events, will continue to monitor closely.
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
## 2015-04-25 NOTE — ED Provider Notes (Signed)
Radiologist called with report of a mesenteric contusion.  This is concerning since the patient is on [place] to.  I will contact surgery.  I spoke with Dr. Marcello Moores, general surgery.  He will come examine the patient, but she feels he will need to be admitted to the trauma service at [facility].  Junius Creamer, NP 04/25/15 1952  [name], MD 04/29/15 [phone]

## 2015-04-25 NOTE — ED Notes (Signed)
Patient states he drinks one fifth of vodka and a case of beer daily.  Patient's CIWA currently 46.
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
## 2015-04-26 DIAGNOSIS — D62 Acute posthemorrhagic anemia: Secondary | ICD-10-CM | POA: Diagnosis not present

## 2015-04-26 LAB — CBC
HEMATOCRIT: 31.4 % — AB (ref 39.0–52.0)
Hemoglobin: 10.5 g/dL — ABNORMAL LOW (ref 13.0–17.0)
MCH: 32.5 pg (ref 26.0–34.0)
MCHC: 33.4 g/dL (ref 30.0–36.0)
MCV: 97.2 fL (ref 78.0–100.0)
Platelets: 198 10*3/uL (ref 150–400)
RBC: 3.23 MIL/uL — ABNORMAL LOW (ref 4.22–5.81)
RDW: 14.8 % (ref 11.5–15.5)
WBC: 6.6 10*3/uL (ref 4.0–10.5)

## 2015-04-26 MED ORDER — MORPHINE SULFATE (PF) 2 MG/ML IV SOLN
2.0000 mg | INTRAVENOUS | Status: DC | PRN
  Filled 2015-04-26: qty 1

## 2015-04-26 MED ORDER — OXYCODONE HCL 5 MG PO TABS
5.0000 mg | ORAL_TABLET | ORAL | Status: DC | PRN
  Administered 2015-04-26: 15 mg via ORAL
  Administered 2015-04-26: 10 mg via ORAL
  Administered 2015-04-27 – 2015-04-28 (×3): 15 mg via ORAL
  Filled 2015-04-26: qty 2
  Filled 2015-04-26 (×4): qty 3

## 2015-04-26 NOTE — Progress Notes (Signed)
PHARMACIST - PHYSICIAN COMMUNICATION  CONCERNING:  Xarelto   RECOMMENDATION: Resume if and when appropriate for DVT/PE history / +lupus anti-coagulant   Thank you. Anette Guarneri, PharmD
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
## 2015-04-27 LAB — CBC
HCT: 31.7 % — ABNORMAL LOW (ref 39.0–52.0)
Hemoglobin: 10.6 g/dL — ABNORMAL LOW (ref 13.0–17.0)
MCH: 33.2 pg (ref 26.0–34.0)
MCHC: 33.4 g/dL (ref 30.0–36.0)
MCV: 99.4 fL (ref 78.0–100.0)
PLATELETS: 209 10*3/uL (ref 150–400)
RBC: 3.19 MIL/uL — AB (ref 4.22–5.81)
RDW: 15 % (ref 11.5–15.5)
WBC: 6.5 10*3/uL (ref 4.0–10.5)
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
## 2015-04-28 MED ORDER — OXYCODONE-ACETAMINOPHEN 7.5-325 MG PO TABS: 1.0000 | ORAL_TABLET | ORAL | Status: DC | PRN
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
## 2015-05-03 ENCOUNTER — Inpatient Hospital Stay (HOSPITAL_COMMUNITY)
Admission: EM | Admit: 2015-05-03 | Discharge: 2015-05-06 | DRG: 914 | Disposition: A | Discharge status: Home / Self Care | Payer: Self-pay | Attending: General Surgery | Admitting: General Surgery

## 2015-05-03 ENCOUNTER — Emergency Department (HOSPITAL_COMMUNITY): Payer: Self-pay

## 2015-05-03 ENCOUNTER — Encounter (HOSPITAL_COMMUNITY): Payer: Self-pay | Admitting: *Deleted

## 2015-05-03 ENCOUNTER — Inpatient Hospital Stay (HOSPITAL_COMMUNITY): Payer: Self-pay

## 2015-05-03 DIAGNOSIS — I82412 Acute embolism and thrombosis of left femoral vein: Secondary | ICD-10-CM | POA: Diagnosis present

## 2015-05-03 DIAGNOSIS — F17210 Nicotine dependence, cigarettes, uncomplicated: Secondary | ICD-10-CM | POA: Diagnosis present

## 2015-05-03 DIAGNOSIS — Z79891 Long term (current) use of opiate analgesic: Secondary | ICD-10-CM

## 2015-05-03 DIAGNOSIS — Z79899 Other long term (current) drug therapy: Secondary | ICD-10-CM

## 2015-05-03 DIAGNOSIS — I82409 Acute embolism and thrombosis of unspecified deep veins of unspecified lower extremity: Secondary | ICD-10-CM | POA: Diagnosis present

## 2015-05-03 DIAGNOSIS — I82512 Chronic embolism and thrombosis of left femoral vein: Secondary | ICD-10-CM | POA: Diagnosis present

## 2015-05-03 DIAGNOSIS — D649 Anemia, unspecified: Secondary | ICD-10-CM

## 2015-05-03 DIAGNOSIS — F329 Major depressive disorder, single episode, unspecified: Secondary | ICD-10-CM | POA: Diagnosis present

## 2015-05-03 DIAGNOSIS — S36892D Contusion of other intra-abdominal organs, subsequent encounter: Secondary | ICD-10-CM

## 2015-05-03 DIAGNOSIS — Z86718 Personal history of other venous thrombosis and embolism: Secondary | ICD-10-CM

## 2015-05-03 DIAGNOSIS — M479 Spondylosis, unspecified: Secondary | ICD-10-CM | POA: Diagnosis present

## 2015-05-03 DIAGNOSIS — I2510 Atherosclerotic heart disease of native coronary artery without angina pectoris: Secondary | ICD-10-CM | POA: Diagnosis present

## 2015-05-03 DIAGNOSIS — F10920 Alcohol use, unspecified with intoxication, uncomplicated: Secondary | ICD-10-CM

## 2015-05-03 DIAGNOSIS — Z7901 Long term (current) use of anticoagulants: Secondary | ICD-10-CM

## 2015-05-03 DIAGNOSIS — Z86711 Personal history of pulmonary embolism: Secondary | ICD-10-CM

## 2015-05-03 DIAGNOSIS — S36892A Contusion of other intra-abdominal organs, initial encounter: Principal | ICD-10-CM | POA: Diagnosis present

## 2015-05-03 DIAGNOSIS — I82432 Acute embolism and thrombosis of left popliteal vein: Secondary | ICD-10-CM | POA: Diagnosis present

## 2015-05-03 DIAGNOSIS — F10229 Alcohol dependence with intoxication, unspecified: Secondary | ICD-10-CM | POA: Diagnosis present

## 2015-05-03 DIAGNOSIS — D6862 Lupus anticoagulant syndrome: Secondary | ICD-10-CM | POA: Diagnosis present

## 2015-05-03 DIAGNOSIS — Z8619 Personal history of other infectious and parasitic diseases: Secondary | ICD-10-CM

## 2015-05-03 LAB — CBC WITH DIFFERENTIAL/PLATELET
Basophils Absolute: 0 10*3/uL (ref 0.0–0.1)
Basophils Relative: 1 % (ref 0–1)
EOS PCT: 3 % (ref 0–5)
Eosinophils Absolute: 0.2 10*3/uL (ref 0.0–0.7)
HCT: 31 % — ABNORMAL LOW (ref 39.0–52.0)
Hemoglobin: 10.5 g/dL — ABNORMAL LOW (ref 13.0–17.0)
LYMPHS ABS: 1.8 10*3/uL (ref 0.7–4.0)
LYMPHS PCT: 31 % (ref 12–46)
MCH: 33.3 pg (ref 26.0–34.0)
MCHC: 33.9 g/dL (ref 30.0–36.0)
MCV: 98.4 fL (ref 78.0–100.0)
MONO ABS: 0.7 10*3/uL (ref 0.1–1.0)
MONOS PCT: 12 % (ref 3–12)
Neutro Abs: 3.2 10*3/uL (ref 1.7–7.7)
Neutrophils Relative %: 53 % (ref 43–77)
PLATELETS: 236 10*3/uL (ref 150–400)
RBC: 3.15 MIL/uL — ABNORMAL LOW (ref 4.22–5.81)
RDW: 16.2 % — AB (ref 11.5–15.5)
WBC: 5.9 10*3/uL (ref 4.0–10.5)

## 2015-05-03 LAB — COMPREHENSIVE METABOLIC PANEL
ALBUMIN: 3.5 g/dL (ref 3.5–5.0)
ALT: 17 U/L (ref 17–63)
AST: 27 U/L (ref 15–41)
Alkaline Phosphatase: 82 U/L (ref 38–126)
Anion gap: 11 (ref 5–15)
CHLORIDE: 105 mmol/L (ref 101–111)
CO2: 23 mmol/L (ref 22–32)
Calcium: 8.4 mg/dL — ABNORMAL LOW (ref 8.9–10.3)
Creatinine, Ser: 0.68 mg/dL (ref 0.61–1.24)
GFR calc Af Amer: >60 mL/min (ref >60)
GFR calc non Af Amer: >60 mL/min (ref >60)
GLUCOSE: 95 mg/dL (ref 65–99)
POTASSIUM: 3.8 mmol/L (ref 3.5–5.1)
Sodium: 139 mmol/L (ref 135–145)
Total Bilirubin: 0.4 mg/dL (ref 0.3–1.2)
Total Protein: 6.8 g/dL (ref 6.5–8.1)

## 2015-05-03 LAB — LIPASE, BLOOD: Lipase: 42 U/L (ref 22–51)

## 2015-05-03 LAB — ETHANOL: Alcohol, Ethyl (B): 314 mg/dL (ref <5)

## 2015-05-03 MED ORDER — PNEUMOCOCCAL VAC POLYVALENT 25 MCG/0.5ML IJ INJ
0.5000 mL | INJECTION | INTRAMUSCULAR | Status: AC
  Administered 2015-05-04: 0.5 mL via INTRAMUSCULAR
  Filled 2015-05-03: qty 0.5

## 2015-05-03 MED ORDER — HYDROXYZINE HCL 25 MG PO TABS: 50.0000 mg | ORAL_TABLET | Freq: Three times a day (TID) | ORAL | Status: DC | PRN

## 2015-05-03 MED ORDER — OXYCODONE-ACETAMINOPHEN 5-325 MG PO TABS
1.0000 | ORAL_TABLET | ORAL | Status: DC | PRN
  Administered 2015-05-03 – 2015-05-06 (×14): 2 via ORAL
  Filled 2015-05-03 (×14): qty 2

## 2015-05-03 MED ORDER — LORAZEPAM 1 MG PO TABS
0.0000 mg | ORAL_TABLET | Freq: Two times a day (BID) | ORAL | Status: DC
  Administered 2015-05-05 (×2): 1 mg via ORAL
  Filled 2015-05-03 (×2): qty 1

## 2015-05-03 MED ORDER — ADULT MULTIVITAMIN W/MINERALS CH
1.0000 | ORAL_TABLET | Freq: Every day | ORAL | Status: DC
  Administered 2015-05-03 – 2015-05-06 (×4): 1 via ORAL
  Filled 2015-05-03 (×4): qty 1

## 2015-05-03 MED ORDER — ACAMPROSATE CALCIUM 333 MG PO TBEC
666.0000 mg | DELAYED_RELEASE_TABLET | Freq: Three times a day (TID) | ORAL | Status: DC
  Administered 2015-05-03 – 2015-05-06 (×9): 666 mg via ORAL
  Filled 2015-05-03 (×13): qty 2

## 2015-05-03 MED ORDER — ONDANSETRON HCL 4 MG PO TABS: 4.0000 mg | ORAL_TABLET | Freq: Four times a day (QID) | ORAL | Status: DC | PRN

## 2015-05-03 MED ORDER — LORAZEPAM 1 MG PO TABS
1.0000 mg | ORAL_TABLET | Freq: Four times a day (QID) | ORAL | Status: AC | PRN
  Filled 2015-05-03: qty 1

## 2015-05-03 MED ORDER — LORAZEPAM 2 MG/ML IJ SOLN: 1.0000 mg | Freq: Four times a day (QID) | INTRAMUSCULAR | Status: AC | PRN

## 2015-05-03 MED ORDER — VITAMIN B-12 1000 MCG PO TABS
1000.0000 ug | ORAL_TABLET | Freq: Every day | ORAL | Status: DC
  Administered 2015-05-03 – 2015-05-06 (×4): 1000 ug via ORAL
  Filled 2015-05-03 (×4): qty 1

## 2015-05-03 MED ORDER — FOLIC ACID 1 MG PO TABS
1.0000 mg | ORAL_TABLET | Freq: Every day | ORAL | Status: DC
  Administered 2015-05-03 – 2015-05-06 (×4): 1 mg via ORAL
  Filled 2015-05-03 (×4): qty 1

## 2015-05-03 MED ORDER — IOHEXOL 300 MG/ML  SOLN
100.0000 mL | Freq: Once | INTRAMUSCULAR | Status: AC | PRN
  Administered 2015-05-03: 100 mL via INTRAVENOUS

## 2015-05-03 MED ORDER — VITAMIN B-1 100 MG PO TABS
100.0000 mg | ORAL_TABLET | Freq: Every day | ORAL | Status: DC
  Administered 2015-05-03 – 2015-05-06 (×4): 100 mg via ORAL
  Filled 2015-05-03 (×4): qty 1

## 2015-05-03 MED ORDER — THIAMINE HCL 100 MG/ML IJ SOLN: 100.0000 mg | Freq: Every day | INTRAMUSCULAR | Status: DC

## 2015-05-03 MED ORDER — LORAZEPAM 1 MG PO TABS
0.0000 mg | ORAL_TABLET | Freq: Four times a day (QID) | ORAL | Status: AC
  Administered 2015-05-03 – 2015-05-05 (×7): 1 mg via ORAL
  Filled 2015-05-03 (×6): qty 1

## 2015-05-03 MED ORDER — IOHEXOL 300 MG/ML  SOLN
25.0000 mL | Freq: Once | INTRAMUSCULAR | Status: AC | PRN
  Administered 2015-05-03: 25 mL via ORAL

## 2015-05-03 MED ORDER — PANTOPRAZOLE SODIUM 40 MG IV SOLR
40.0000 mg | Freq: Every day | INTRAVENOUS | Status: DC
  Filled 2015-05-03 (×2): qty 40

## 2015-05-03 MED ORDER — OXYCODONE HCL 5 MG PO TABS: 2.5000 mg | ORAL_TABLET | ORAL | Status: DC | PRN

## 2015-05-03 MED ORDER — INFLUENZA VAC SPLIT QUAD 0.5 ML IM SUSY
0.5000 mL | PREFILLED_SYRINGE | INTRAMUSCULAR | Status: AC
  Administered 2015-05-04: 0.5 mL via INTRAMUSCULAR
  Filled 2015-05-03: qty 0.5

## 2015-05-03 MED ORDER — HYDROMORPHONE HCL 1 MG/ML IJ SOLN: 0.5000 mg | INTRAMUSCULAR | Status: DC | PRN

## 2015-05-03 MED ORDER — ONDANSETRON HCL 4 MG/2ML IJ SOLN: 4.0000 mg | Freq: Four times a day (QID) | INTRAMUSCULAR | Status: DC | PRN

## 2015-05-03 MED ORDER — CITALOPRAM HYDROBROMIDE 20 MG PO TABS
20.0000 mg | ORAL_TABLET | Freq: Every day | ORAL | Status: DC
  Administered 2015-05-03 – 2015-05-06 (×4): 20 mg via ORAL
  Filled 2015-05-03 (×4): qty 1

## 2015-05-03 MED ORDER — PANTOPRAZOLE SODIUM 40 MG PO TBEC
40.0000 mg | DELAYED_RELEASE_TABLET | Freq: Every day | ORAL | Status: DC
  Administered 2015-05-03 – 2015-05-06 (×4): 40 mg via ORAL
  Filled 2015-05-03 (×4): qty 1

## 2015-05-03 MED ORDER — OXYCODONE-ACETAMINOPHEN 7.5-325 MG PO TABS: 1.0000 | ORAL_TABLET | ORAL | Status: DC | PRN

## 2015-05-03 NOTE — ED Notes (Signed)
Lab reported ETOH [id number]

## 2015-05-03 NOTE — ED Notes (Signed)
Patient removed yellow socks and threw them on the floor

## 2015-05-03 NOTE — Progress Notes (Signed)
See Dr. Biagio Borg note from earlier today.  He was re-admitted this am after being in the hospital between 04/24/15 - 04/28/15.  I called Hematology - Dr. Burr Medico is on call for hematology.  The patient is seen by Dr. Alvy Bimler at the cancer center for Lupus.  He's had a h/o DVT and PE in the past.  He's on Xarelto which was resumed prior to discharge on 04/28/15.  He has a mesenteric hematoma which is larger this time 3.5 and 3.4 cm.  I talked to Dr. Burr Medico she recommended holding Xarelto for now and getting duplex of b/l LE to see if he has a current DVT.  Per CT scan he does have a IVC filter.  She recommends following up with Dr. Alvy Bimler next week to decide to resume Xarelto or not.  May need to weigh pro's and con's secondary to his current lifestyle of heaving drinking and falls.  He's on CIWA and home meds.  PT/OT ordered.  Talked to his nurse, he's tolerating clears, will advance to fulls at lunch.     Jomarie Longs, PA-C General Surgery [facility] [phone]   Addendum [phone]: I got a call from the [place] tech for results of Bilateral lower extremity venous duplex completed.  The right lower extremity is negative for deep vein thrombosis.  The left lower extremity is positive for deep vein thrombosis of varying age in the left lower extremity. 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 There is no evidence of Baker's cyst bilaterally.  Talked to Dr. Burr Medico again since he has a filter, just watch for now.  Hold Xarelto.  Will need to follow up with Dr. Alvy Bimler next week.

## 2015-05-03 NOTE — ED Notes (Signed)
Dr. Roxanne Mins bedside.
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
## 2015-05-03 NOTE — ED Notes (Signed)
Dr. Thompson at the bedside 

## 2015-05-03 NOTE — Progress Notes (Addendum)
*  Preliminary Results* Bilateral lower extremity venous duplex completed. The right lower extremity is negative for deep vein thrombosis. The left lower extremity is positive for deep vein thrombosis of varying age in the left lower extremity. 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 There is no evidence of Baker's cyst bilaterally.  Preliminary results discussed with Jomarie Longs, PA-C.  05/03/2015  Maudry Mayhew, RVT, RDCS, RDMS
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
## 2015-05-03 NOTE — ED Notes (Addendum)
Pt placed in yellow socks, hospital gown, and yellow fall wristband. Pt laying in bed in position of comfort.

## 2015-05-04 LAB — CBC
HEMATOCRIT: 32.1 % — AB (ref 39.0–52.0)
HEMOGLOBIN: 10.5 g/dL — AB (ref 13.0–17.0)
MCH: 32.5 pg (ref 26.0–34.0)
MCHC: 32.7 g/dL (ref 30.0–36.0)
MCV: 99.4 fL (ref 78.0–100.0)
Platelets: 248 10*3/uL (ref 150–400)
RBC: 3.23 MIL/uL — AB (ref 4.22–5.81)
RDW: 16.6 % — ABNORMAL HIGH (ref 11.5–15.5)
WBC: 4.9 10*3/uL (ref 4.0–10.5)

## 2015-05-04 LAB — BASIC METABOLIC PANEL
ANION GAP: 7 (ref 5–15)
CHLORIDE: 100 mmol/L — AB (ref 101–111)
CO2: 29 mmol/L (ref 22–32)
Calcium: 8.6 mg/dL — ABNORMAL LOW (ref 8.9–10.3)
Creatinine, Ser: 0.63 mg/dL (ref 0.61–1.24)
GFR calc Af Amer: >60 mL/min (ref >60)
GFR calc non Af Amer: >60 mL/min (ref >60)
GLUCOSE: 86 mg/dL (ref 65–99)
POTASSIUM: 3.9 mmol/L (ref 3.5–5.1)
Sodium: 136 mmol/L (ref 135–145)

## 2015-05-04 NOTE — Progress Notes (Signed)
OT Cancellation Note  Patient Details Name: Shloime Keilman MRN: [id number] DOB: 11/24/1965   Cancelled Treatment:    Reason Eval/Treat Not Completed: OT screened, no needs identified, will sign off  Benito Mccreedy OTR/L [phone] 05/04/2015, 2:37 PM
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
## 2015-05-04 NOTE — Progress Notes (Signed)
Assisted nursing student Cyril Mourning to pull 12:00 medications from the [facility] including Campral, B-1, B-12, and immunizations - flu and pneumonia vaccines. The nursing instructor will assist this student in administering medications.

## 2015-05-05 MED ORDER — POLYETHYLENE GLYCOL 3350 17 G PO PACK
17.0000 g | PACK | Freq: Every day | ORAL | Status: DC
  Administered 2015-05-05 – 2015-05-06 (×2): 17 g via ORAL
  Filled 2015-05-05 (×2): qty 1
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
## 2015-05-06 MED ORDER — OXYCODONE-ACETAMINOPHEN 5-325 MG PO TABS: 1.0000 | ORAL_TABLET | ORAL | Status: DC | PRN
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
## 2015-05-06 NOTE — Discharge Instructions (Signed)
No running, jumping, ball or contact sports, bikes, skateboards, motorcycles, etc for 6 weeks.  No driving while taking oxycodone.

## 2015-05-06 NOTE — Progress Notes (Signed)
Pt was given [facility] letter upon dc of previous admission April 28, 2015.  He is not eligible for [facility] letter with this admission, as it is only once yearly.    Reinaldo Raddle, RN, BSN  Trauma/Neuro ICU Case Manager [phone]
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
## 2015-05-06 NOTE — Progress Notes (Signed)
Pt ready for D/C per doctor. D/C teaching and prescription given to pt. All questions answered.

## 2015-05-06 NOTE — Care Management Note (Signed)
Case Management Note  Patient Details  Name: [name] MRN: [id number] Date of Birth: March 30, 1966  Subjective/Objective:    Pt admitted on 05/03/15 with abdominal pain, enlargement of previous mesenteric hematoma.  PTA, pt independent of ADLS.                  Action/Plan: Will follow for dc needs as pt progresses.    Expected Discharge Date:      05/06/15            Expected Discharge Plan:  Home/Self Care  In-House Referral:     Discharge planning Services  CM Consult  Post Acute Care Choice:    Choice offered to:     DME Arranged:    DME Agency:     HH Arranged:    HH Agency:     Status of Service:  Completed, signed off  Medicare Important Message Given:    Date Medicare IM Given:    Medicare IM give by:    Date Additional Medicare IM Given:    Additional Medicare Important Message give by:     If discussed at [facility] of Stay Meetings, dates discussed:    Additional Comments:  Reinaldo Raddle, RN, BSN  Trauma/Neuro ICU Case Manager [phone]

## 2015-05-15 ENCOUNTER — Emergency Department (HOSPITAL_BASED_OUTPATIENT_CLINIC_OR_DEPARTMENT_OTHER): Payer: Self-pay

## 2015-05-15 ENCOUNTER — Emergency Department (HOSPITAL_COMMUNITY): Payer: Self-pay

## 2015-05-15 ENCOUNTER — Encounter (HOSPITAL_COMMUNITY): Payer: Self-pay | Admitting: Emergency Medicine

## 2015-05-15 ENCOUNTER — Emergency Department (HOSPITAL_COMMUNITY)
Admission: EM | Admit: 2015-05-15 | Discharge: 2015-05-15 | Disposition: A | Discharge status: Home / Self Care | Payer: Medicaid Other | Attending: Emergency Medicine | Admitting: Emergency Medicine

## 2015-05-15 DIAGNOSIS — Z720 Tobacco use: Secondary | ICD-10-CM | POA: Insufficient documentation

## 2015-05-15 DIAGNOSIS — F10920 Alcohol use, unspecified with intoxication, uncomplicated: Secondary | ICD-10-CM

## 2015-05-15 DIAGNOSIS — M1990 Unspecified osteoarthritis, unspecified site: Secondary | ICD-10-CM | POA: Insufficient documentation

## 2015-05-15 DIAGNOSIS — F1020 Alcohol dependence, uncomplicated: Secondary | ICD-10-CM

## 2015-05-15 DIAGNOSIS — Z7901 Long term (current) use of anticoagulants: Secondary | ICD-10-CM | POA: Insufficient documentation

## 2015-05-15 DIAGNOSIS — Z86718 Personal history of other venous thrombosis and embolism: Secondary | ICD-10-CM

## 2015-05-15 DIAGNOSIS — F329 Major depressive disorder, single episode, unspecified: Secondary | ICD-10-CM | POA: Insufficient documentation

## 2015-05-15 DIAGNOSIS — Z8619 Personal history of other infectious and parasitic diseases: Secondary | ICD-10-CM | POA: Insufficient documentation

## 2015-05-15 DIAGNOSIS — Z79899 Other long term (current) drug therapy: Secondary | ICD-10-CM | POA: Insufficient documentation

## 2015-05-15 DIAGNOSIS — I2510 Atherosclerotic heart disease of native coronary artery without angina pectoris: Secondary | ICD-10-CM | POA: Insufficient documentation

## 2015-05-15 DIAGNOSIS — Z86711 Personal history of pulmonary embolism: Secondary | ICD-10-CM | POA: Insufficient documentation

## 2015-05-15 DIAGNOSIS — I82512 Chronic embolism and thrombosis of left femoral vein: Secondary | ICD-10-CM

## 2015-05-15 DIAGNOSIS — F10220 Alcohol dependence with intoxication, uncomplicated: Secondary | ICD-10-CM | POA: Insufficient documentation

## 2015-05-15 LAB — COMPREHENSIVE METABOLIC PANEL
ALBUMIN: 4 g/dL (ref 3.5–5.0)
ALT: 25 U/L (ref 17–63)
AST: 56 U/L — AB (ref 15–41)
Alkaline Phosphatase: 71 U/L (ref 38–126)
Anion gap: 9 (ref 5–15)
BUN: 5 mg/dL — AB (ref 6–20)
CHLORIDE: 107 mmol/L (ref 101–111)
CO2: 21 mmol/L — ABNORMAL LOW (ref 22–32)
Calcium: 8.4 mg/dL — ABNORMAL LOW (ref 8.9–10.3)
Creatinine, Ser: 0.77 mg/dL (ref 0.61–1.24)
GFR calc Af Amer: >60 mL/min (ref >60)
GFR calc non Af Amer: >60 mL/min (ref >60)
GLUCOSE: 86 mg/dL (ref 65–99)
POTASSIUM: 5.9 mmol/L — AB (ref 3.5–5.1)
Sodium: 137 mmol/L (ref 135–145)
Total Bilirubin: 0.6 mg/dL (ref 0.3–1.2)
Total Protein: 8 g/dL (ref 6.5–8.1)

## 2015-05-15 LAB — CBC WITH DIFFERENTIAL/PLATELET
BASOS ABS: 0.1 10*3/uL (ref 0.0–0.1)
BASOS PCT: 1 % (ref 0–1)
EOS PCT: 1 % (ref 0–5)
Eosinophils Absolute: 0.1 10*3/uL (ref 0.0–0.7)
HEMATOCRIT: 35.9 % — AB (ref 39.0–52.0)
Hemoglobin: 11.9 g/dL — ABNORMAL LOW (ref 13.0–17.0)
Lymphocytes Relative: 36 % (ref 12–46)
Lymphs Abs: 2 10*3/uL (ref 0.7–4.0)
MCH: 33.1 pg (ref 26.0–34.0)
MCHC: 33.1 g/dL (ref 30.0–36.0)
MCV: 99.7 fL (ref 78.0–100.0)
MONO ABS: 0.6 10*3/uL (ref 0.1–1.0)
Monocytes Relative: 11 % (ref 3–12)
NEUTROS ABS: 2.9 10*3/uL (ref 1.7–7.7)
Neutrophils Relative %: 51 % (ref 43–77)
PLATELETS: 323 10*3/uL (ref 150–400)
RBC: 3.6 MIL/uL — ABNORMAL LOW (ref 4.22–5.81)
RDW: 17.2 % — AB (ref 11.5–15.5)
WBC: 5.5 10*3/uL (ref 4.0–10.5)

## 2015-05-15 LAB — POTASSIUM: Potassium: 4 mmol/L (ref 3.5–5.1)

## 2015-05-15 LAB — ETHANOL: Alcohol, Ethyl (B): 342 mg/dL (ref <5)

## 2015-05-15 NOTE — Progress Notes (Signed)
VASCULAR LAB PRELIMINARY  PRELIMINARY  PRELIMINARY  PRELIMINARY  Bilateral lower extremity venous duplex  completed.    Preliminary report:  Right:  No evidence of DVT, superficial thrombosis, or Baker's cyst.  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  Indeterminate age DVT noted in the FV and popliteal v.  No acute DVT noted.   No evidence of superficial thrombosis.  Baker's cyst noted in the left popliteal fossa.   Tamario Heal, RVT 05/15/2015, 9:24 AM
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
## 2015-05-15 NOTE — ED Notes (Signed)
Bed: WA09 Expected date: 05/15/15 Expected time: 1:34 AM Means of arrival: Ambulance Comments: 49 yo M  Leg pain, ETOH

## 2015-05-15 NOTE — Discharge Instructions (Signed)
Chronic Deep Vein Thrombosis A deep vein thrombosis (DVT) is a blood clot that develops in the deep, larger veins of the leg, arm, or pelvis. These are more dangerous than clots that might form in veins near the surface of the body. A DVT can lead to serious and even life-threatening complications if the clot breaks off and travels in the bloodstream to the lungs.  A DVT can damage the valves in your leg veins so that instead of flowing upward, the blood pools in the lower leg. This is called post-thrombotic syndrome, and it can result in pain, swelling, discoloration, and sores on the leg. CAUSES Usually, several things contribute to the formation of blood clots. Contributing factors include:  The flow of blood slows down.  The inside of the vein is damaged in some way.  You have a condition that makes blood clot more easily. RISK FACTORS Some people are more likely than others to develop blood clots. Risk factors include:   Smoking.  Being overweight (obese).  Sitting or lying still for a long time. This includes long-distance travel, paralysis, or recovery from an illness or surgery. Other factors that increase risk are:   Older age, especially over 37 years of age.  Having a family history of blood clots or if you have already had a blot clot.  Having major or lengthy surgery. This is especially true for surgery on the hip, knee, or belly (abdomen). Hip surgery is particularly high risk.  Having a long, thin tube (catheter) placed inside a vein during a medical procedure.  Breaking a hip or leg.  Having cancer or cancer treatment.  Pregnancy and childbirth.  Hormone changes make the blood clot more easily during pregnancy.  The fetus puts pressure on the veins of the pelvis.  There is a risk of injury to veins during delivery or a caesarean delivery. The risk is highest just after childbirth.  Medicines containing the male hormone estrogen. This includes birth control  pills and hormone replacement therapy.  Other circulation or heart problems.  SIGNS AND SYMPTOMS When a clot forms, it can either partially or totally block the blood flow in that vein. Symptoms of a DVT can include:  Swelling of the leg or arm, especially if one side is much worse.  Warmth and redness of the leg or arm, especially if one side is much worse.  Pain in an arm or leg. If the clot is in the leg, symptoms may be more noticeable or worse when standing or walking. The symptoms of a DVT that has traveled to the lungs (pulmonary embolism, PE) usually start suddenly and include:  Shortness of breath.  Coughing.  Coughing up blood or blood-tinged mucus.  Chest pain. The chest pain is often worse with deep breaths.  Rapid heartbeat. Anyone with these symptoms should get emergency medical treatment right away. Do not wait to see if the symptoms will go away. Call your local emergency services (911 in the [place] if you have these symptoms. Do not drive yourself to the hospital. DIAGNOSIS If a DVT is suspected, your health care provider will take a full medical history and perform a physical exam. Tests that also may be required include:  Blood tests, including studies of the clotting properties of the blood.  Ultrasound to see if you have clots in your legs or lungs.  X-rays to show the flow of blood when dye is injected into the veins (venogram).  Studies of your lungs if you have  any chest symptoms. PREVENTION  Exercise the legs regularly. Take a brisk 30-minute walk every day.  Maintain a weight that is appropriate for your height.  Avoid sitting or lying in bed for long periods of time without moving your legs.  Women, particularly those over the age of 38 years, should consider the risks and benefits of taking estrogen medicines, including birth control pills.  Do not smoke, especially if you take estrogen medicines.  Long-distance travel can increase your risk of  DVT. You should exercise your legs by walking or pumping the muscles every hour.  Many of the risk factors above relate to situations that exist with hospitalization, either for illness, injury, or elective surgery. Prevention may include medical and nonmedical measures.  Your health care provider will assess you for the need for venous thromboembolism prevention when you are admitted to the hospital. If you are having surgery, your surgeon will assess you the day of or day after surgery. TREATMENT Once identified, a DVT can be treated. It can also be prevented in some circumstances. Once you have had a DVT, you may be at increased risk for a DVT in the future. The most common treatment for DVT is blood-thinning (anticoagulant) medicine, which reduces the blood's tendency to clot. Anticoagulants can stop new blood clots from forming and stop old clots from growing. They cannot dissolve existing clots. Your body does this by itself over time. Anticoagulants can be given by mouth, through an IV tube, or by injection. Your health care provider will determine the best program for you. Other medicines or treatments that may be used are:  Heparin or related medicines (low molecular weight heparin) are often the first treatment for a blood clot. They act quickly. However, they cannot be taken orally and must be given either in shot form or by IV tube.  Heparin can cause a fall in a component of blood that stops bleeding and forms blood clots (platelets). You will be monitored with blood tests to be sure this does not occur.  Warfarin is an anticoagulant that can be swallowed. It takes a few days to start working, so usually heparin or related medicines are used in combination. Once warfarin is working, heparin is usually stopped.  Factor Xa inhibitor medicines, such as rivaroxaban and apixaban, also reduce blood clotting. These medicines are taken orally and can often be used without heparin or related  medicines.  Less commonly, clot dissolving drugs (thrombolytics) are used to dissolve a DVT. They carry a high risk of bleeding, so they are used mainly in severe cases where your life or a part of your body is threatened.  Very rarely, a blood clot in the leg needs to be removed surgically.  If you are unable to take anticoagulants, your health care provider may arrange for you to have a filter placed in a main vein in your abdomen. This filter prevents clots from traveling to your lungs. HOME CARE INSTRUCTIONS  Take all medicines as directed by your health care provider.  Learn as much as you can about DVT.  Wear a medical alert bracelet or carry a medical alert card.  Ask your health care provider how soon you can go back to normal activities. It is important to stay active to prevent blood clots. If you are on anticoagulant medicine, avoid contact sports.  It is very important to exercise. This is especially important while traveling, sitting, or standing for long periods of time. Exercise your legs by walking or  by tightening and relaxing your leg muscles regularly. Take frequent walks.  You may need to wear compression stockings. These are tight elastic stockings that apply pressure to the lower legs. This pressure can help keep the blood in the legs from clotting. Taking Warfarin Warfarin is a daily medicine that is taken by mouth. Your health care provider will advise you on the length of treatment (usually 3-6 months, sometimes lifelong). If you take warfarin:  Understand how to take warfarin and foods that can affect how warfarin works in Veterinary surgeon.  Too much and too little warfarin are both dangerous. Too much warfarin increases the risk of bleeding. Too little warfarin continues to allow the risk for blood clots. Warfarin and Regular Blood Testing While taking warfarin, you will need to have regular blood tests to measure your blood clotting time. These blood tests usually  include both the prothrombin time (PT) and international normalized ratio (INR) tests. The PT and INR results allow your health care provider to adjust your dose of warfarin. It is very important that you have your PT and INR tested as often as directed by your health care provider.  Warfarin and Your Diet Avoid major changes in your diet, or notify your health care provider before changing your diet. Arrange a visit with a registered dietitian to answer your questions. Many foods, especially foods high in vitamin K, can interfere with warfarin and affect the PT and INR results. You should eat a consistent amount of foods high in vitamin K. Foods high in vitamin K include:   Spinach, kale, broccoli, cabbage, collard and turnip greens, Brussels sprouts, peas, cauliflower, seaweed, and parsley.  Beef and pork liver.  Green tea.  Soybean oil. Warfarin with Other Medicines Many medicines can interfere with warfarin and affect the PT and INR results. You must:  Tell your health care provider about any and all medicines, vitamins, and supplements you take, including aspirin and other over-the-counter anti-inflammatory medicines. Be especially cautious with aspirin and anti-inflammatory medicines. Ask your health care provider before taking these.  Do not take or discontinue any prescribed or over-the-counter medicine except on the advice of your health care provider or pharmacist. Warfarin Side Effects Warfarin can have side effects, such as easy bruising and difficulty stopping bleeding. Ask your health care provider or pharmacist about other side effects of warfarin. You will need to:  Hold pressure over cuts for longer than usual.  Notify your dentist and other health care providers that you are taking warfarin before you undergo any procedures where bleeding may occur. Warfarin with Alcohol and Tobacco   Drinking alcohol frequently can increase the effect of warfarin, leading to excess  bleeding. It is best to avoid alcoholic drinks or to consume only very small amounts while taking warfarin. Notify your health care provider if you change your alcohol intake.   Do not use any tobacco products including cigarettes, chewing tobacco, or electronic cigarettes. If you smoke, quit. Ask your health care provider for help with quitting smoking. Alternative Medicines to Warfarin: Factor Xa Inhibitor Medicines  These blood-thinning medicines are taken by mouth, usually for several weeks or longer. It is important to take the medicine every single day at the same time each day.  There are no regular blood tests required when using these medicines.  There are fewer food and drug interactions than with warfarin.  The side effects of this class of medicine are similar to those of warfarin, including excessive bruising or bleeding. Ask  your health care provider or pharmacist about other potential side effects. SEEK MEDICAL CARE IF:  You notice a rapid heartbeat.  You feel weaker or more tired than usual.  You feel faint.  You notice increased bruising.  You feel your symptoms are not getting better in the time expected.  You believe you are having side effects of medicine. SEEK IMMEDIATE MEDICAL CARE IF:  You have chest pain.  You have trouble breathing.  You have new or increased swelling or pain in one leg.  You cough up blood.  You notice blood in vomit, in a bowel movement, or in urine. MAKE SURE YOU:  Understand these instructions.  Will watch your condition.  Will get help right away if you are not doing well or get worse. Document Released: 08/24/2005 Document Revised: 01/08/2014 Document Reviewed: 05/01/2013 [facility] Patient Information 2015 [place], [place]. This information is not intended to replace advice given to you by your health care provider. Make sure you discuss any questions you have with your health care provider.  Alcohol Use Disorder Alcohol  use disorder is a mental disorder. It is not a one-time incident of heavy drinking. Alcohol use disorder is the excessive and uncontrollable use of alcohol over time that leads to problems with functioning in one or more areas of daily living. People with this disorder risk harming themselves and others when they drink to excess. Alcohol use disorder also can cause other mental disorders, such as mood and anxiety disorders, and serious physical problems. People with alcohol use disorder often misuse other drugs.  Alcohol use disorder is common and widespread. Some people with this disorder drink alcohol to cope with or escape from negative life events. Others drink to relieve chronic pain or symptoms of mental illness. People with a family history of alcohol use disorder are at higher risk of losing control and using alcohol to excess.  SYMPTOMS  Signs and symptoms of alcohol use disorder may include the following:   Consumption ofalcohol inlarger amounts or over a longer period of time than intended.  Multiple unsuccessful attempts to cutdown or control alcohol use.   A great deal of time spent obtaining alcohol, using alcohol, or recovering from the effects of alcohol (hangover).  A strong desire or urge to use alcohol (cravings).   Continued use of alcohol despite problems at work, school, or home because of alcohol use.   Continued use of alcohol despite problems in relationships because of alcohol use.  Continued use of alcohol in situations when it is physically hazardous, such as driving a car.  Continued use of alcohol despite awareness of a physical or psychological problem that is likely related to alcohol use. Physical problems related to alcohol use can involve the brain, heart, liver, stomach, and intestines. Psychological problems related to alcohol use include intoxication, depression, anxiety, psychosis, delirium, and dementia.   The need for increased amounts of alcohol  to achieve the same desired effect, or a decreased effect from the consumption of the same amount of alcohol (tolerance).  Withdrawal symptoms upon reducing or stopping alcohol use, or alcohol use to reduce or avoid withdrawal symptoms. Withdrawal symptoms include:  Racing heart.  Hand tremor.  Difficulty sleeping.  Nausea.  Vomiting.  Hallucinations.  Restlessness.  Seizures. DIAGNOSIS Alcohol use disorder is diagnosed through an assessment by your health care provider. Your health care provider may start by asking three or four questions to screen for excessive or problematic alcohol use. To confirm a diagnosis of alcohol  use disorder, at least two symptoms must be present within a 75-month period. The severity of alcohol use disorder depends on the number of symptoms:  Mild--two or three.  Moderate--four or five.  Severe--six or more. Your health care provider may perform a physical exam or use results from lab tests to see if you have physical problems resulting from alcohol use. Your health care provider may refer you to a mental health professional for evaluation. TREATMENT  Some people with alcohol use disorder are able to reduce their alcohol use to low-risk levels. Some people with alcohol use disorder need to quit drinking alcohol. When necessary, mental health professionals with specialized training in substance use treatment can help. Your health care provider can help you decide how severe your alcohol use disorder is and what type of treatment you need. The following forms of treatment are available:   Detoxification. Detoxification involves the use of prescription medicines to prevent alcohol withdrawal symptoms in the first week after quitting. This is important for people with a history of symptoms of withdrawal and for heavy drinkers who are likely to have withdrawal symptoms. Alcohol withdrawal can be dangerous and, in severe cases, cause death. Detoxification is  usually provided in a hospital or in-patient substance use treatment facility.  Counseling or talk therapy. Talk therapy is provided by substance use treatment counselors. It addresses the reasons people use alcohol and ways to keep them from drinking again. The goals of talk therapy are to help people with alcohol use disorder find healthy activities and ways to cope with life stress, to identify and avoid triggers for alcohol use, and to handle cravings, which can cause relapse.  Medicines.Different medicines can help treat alcohol use disorder through the following actions:  Decrease alcohol cravings.  Decrease the positive reward response felt from alcohol use.  Produce an uncomfortable physical reaction when alcohol is used (aversion therapy).  Support groups. Support groups are run by people who have quit drinking. They provide emotional support, advice, and guidance. These forms of treatment are often combined. Some people with alcohol use disorder benefit from intensive combination treatment provided by specialized substance use treatment centers. Both inpatient and outpatient treatment programs are available. Document Released: 10/01/2004 Document Revised: 01/08/2014 Document Reviewed: 12/01/2012 [facility] Patient Information 2015 [place], [place]. This information is not intended to replace advice given to you by your health care provider. Make sure you discuss any questions you have with your health care provider.  Emergency Department Resource Guide 1) Find a Doctor and Pay Out of Pocket Although you won't have to find out who is covered by your insurance plan, it is a good idea to ask around and get recommendations. You will then need to call the office and see if the doctor you have chosen will accept you as a new patient and what types of options they offer for patients who are self-pay. Some doctors offer discounts or will set up payment plans for their patients who do not have  insurance, but you will need to ask so you aren't surprised when you get to your appointment.  2) Contact Your Local Health Department Not all health departments have doctors that can see patients for sick visits, but many do, so it is worth a call to see if yours does. If you don't know where your local health department is, you can check in your phone book. The CDC also has a tool to help you locate your state's health department, and many state websites  also have listings of all of their local health departments.  3) Find a [facility] If your illness is not likely to be very severe or complicated, you may want to try a walk in clinic. These are popping up all over the country in pharmacies, drugstores, and shopping centers. They're usually staffed by nurse practitioners or physician assistants that have been trained to treat common illnesses and complaints. They're usually fairly quick and inexpensive. However, if you have serious medical issues or chronic medical problems, these are probably not your best option.  No Primary Care Doctor: - Call Health Connect at  [phone] - they can help you locate a primary care doctor that  accepts your insurance, provides certain services, etc. - Physician Referral Service- [phone]  Chronic Pain Problems: Organization         Address  Phone   Notes  [facility]  [phone] Patients need to be referred by their primary care doctor.   Medication Assistance: Organization         Address  Phone   Notes  [facility] [address] [place], [place] [address] [phone] --Must be a resident of [facility] -- Must have NO insurance coverage whatsoever (no Medicaid/ Medicare, etc.) -- The pt. MUST have a primary care doctor that directs their care regularly and follows them in the community   [facility]  [phone]   [employer]  [phone]    Agencies that provide  inexpensive medical care: Organization         Address  Phone   Notes  [place]  [phone]   [facility]    [phone]   [facility] [place], [place] [address] [phone]   [place] [address] [place] [phone]   Planned Parenthood    [phone]   [facility]    [phone]   [place] and [place] [address], [place] Phone:  [phone], Fax:  [phone] Hours of Operation:  9 am - 6 pm, M-F.  Also accepts Medicaid/Medicare and self-pay.  [facility] for [place] [address], [address], [place] Phone: [phone], Fax: [phone]. Hours of Operation:  8:30 am - 5:30 pm, M-F.  Also accepts Medicaid and self-pay.  [facility] [place] [address] [place] Phone: [phone]   [place], [place], [place] [phone], Ext. [phone] Mondays & Thursdays: 7-9 AM.  First 15 patients are seen on a first come, first serve basis.    [place] Providers:  Organization         Address  Phone   Notes  [facility] [address], [place] [phone] Also accepts self-pay patients.  [facility] [address], [place]  [phone]   [place], [address], [place] [phone]   [facility] [address], [place] [phone]   Lucianne Lei [address], [place]   [phone] Only accepts [place] patients after they have their name applied to their card.   Self-Pay (no insurance) in [facility]:  [employer]  Notes  Sickle Cell Patients, [place] [phone]   [facility] [place] [phone]   [facility] [place]  [place], [place], [place] [phone]   [facility]/Dr. Osei-Bonsu  [address] [place] or [address], [place] [phone] Phone number for both [place] and [place] locations is the same.  [facility] [address] [place] [phone]   [facility] [address], [place] or [address] [phone]   [facility] [address], [place] [phone], phone; [phone], fax Sees patients 1st and 3rd Saturday of every month.  Must not qualify for public or private insurance (i.e. Medicaid, Medicare, [place] Health Choice, Veterans' Benefits)  Household income should be no more than 200% of the poverty level The clinic cannot treat you if you are pregnant or think you are pregnant  Sexually transmitted diseases are not treated at the clinic.    Dental Care: Organization         Address  Phone  Notes  [facility] Department of [facility] [place] [phone] Accepts children up to age 72 who are enrolled in [place] or [place]; pregnant women with a Medicaid card; and children who have applied for Medicaid or [place] Health Choice, but were declined, whose parents can pay a reduced fee at time of service.  [facility] Department of [facility]  [address], [place] [phone] Accepts children up to age 86 who are enrolled in [place] or [place]; pregnant women with a Medicaid card; and children who have applied for Medicaid or [place] Health Choice, but were declined, whose parents can pay a reduced fee at time of service.  [place] [facility]  [place] [phone] Patients are seen by appointment only. Walk-ins are  not accepted. [place] will see patients 77 years of age and older. Monday - Tuesday (8am-5pm) Most Wednesdays (8:30-5pm) $30 per visit, cash only  [facility] Adult Dental Access PROGRAM  [address], [facility] [phone] Patients are seen by appointment only. Walk-ins are not accepted. [place] will see patients 12 years of age and older. One Wednesday Evening (Monthly: Volunteer Based).  $30 per visit, cash only  [place]  [phone] for adults; Children under age 52, call Graduate Pediatric Dentistry at [phone]. Children aged 29-14, please call [phone] to request a pediatric application.  Dental services are provided in all areas of dental care including fillings, crowns and bridges, complete and partial dentures, implants, gum treatment, root canals, and extractions. Preventive care is also provided. Treatment is provided to both adults and children. Patients are selected via a lottery and there is often a waiting list.   [facility] [phone] [email or web address]   [facility] [address] [place], [place] [phone], Ext. [phone] Second and Fourth Thursday of each month, opens at 6:30 AM; Clinic ends at 9 AM.  Patients are seen on a first-come first-served basis, and a limited number are seen during each clinic.   [facility]  [address] Hillard Danker [place], [place] [phone]  Eligibility Requirements You must have lived in [place], [place], or [place] counties for at least the last three months.   You cannot be eligible for state or federal sponsored [employer], including [employer], [place], or [employer].   You generally cannot be eligible for healthcare insurance through your employer.    How to apply: Eligibility screenings are held every Tuesday and Wednesday afternoon from 1:00 pm until 4:00 pm. You do not need an appointment for  the interview!  [facility] [address], [place], [place]   [place]  [place] Department  [place]  [phone]    Behavioral Health Resources in the Community: Intensive Outpatient Programs Organization         Address  Phone  Notes  [place]. [address], [place], [place] [phone]   [facility] Outpatient [address], [place], [place]   ADS: [facility] Drug [facility] [address] [place], [place]   [place] [address],  [place], [place] or [phone]   Substance Abuse Resources Organization         Address  Phone  Notes  [facility] Drug Services  [phone]   [place]  [phone]   The [place]   Chinita Pester  [phone]   Residential & Outpatient Substance Abuse Program  [phone]   Psychological Services Organization         Address  Phone  Notes  [facility] [place]  [place]  [phone]   [place] [address], [place] or [phone]    Mobile Crisis Teams Organization         Address  Phone  Notes  [facility], Mobile Crisis Care Unit  [phone]   [facility]  [address] [place], [place]   [place] [address] [place] [phone]    Self-Help/Support Groups Organization         Address  Phone             Notes  [place]. of [place] - variety of support groups  [place] Call for more information  Narcotics Anonymous (NA), [facility] [address], [employer] [place]  2 meetings at this location   Special educational needs teacher         Address  Phone  Notes  [facility]  [place],    [place]  [phone]   [facility]  [address] [place] [phone], [place], [place]   [place], [place] [phone] Admissions: 8am-3pm M-F  Incentives Substance [place] [address],    [place], [place] [phone]   [facility] [address] Jadene Pierini [place], [place]   [facility] [address],  [place], [place]   [facility] - Intensive Outpatient [address], [place], [place]   [facility] ([place].,  [place], [place] or [phone]   [facility] ([facility]) [address] [place], [place] Accepts Medicaid  [facility] [place] [address],  [place] [phone] Substance Abuse/Addiction Treatment   [facility] Resources Organization  Address  Phone  Notes  [facility]  [phone]   Domenic Schwab, PhD [address] Arlis Porta [place], [place]   [phone] or [phone]   [place], [place] [phone]   [address], [place], [place] [phone] Insurance/Medicaid/sponsorship through [facility] and Families [address], [address] [place]                                    [place], [place] [phone] [place] [address], [place] [phone]    Dr. Adele Schilder  [phone]   [facility] [place] Dept. 1) [address] [place] 2) [place] 3)  [place] [address], [place] [phone]  [phone]   [place] [phone] or [phone] (After Hours)

## 2015-05-15 NOTE — ED Provider Notes (Signed)
Patient reassessed at 10:05. He is sleeping quietly in the room but awakens to light stimulus. Once awake he is alert and appropriate. His speech is coordinated and situationally appropriate. He is showing normal neurologic function, sitting up in the bed speaking with me and moving all extremities appropriately. He is reviewing his social needs for medication continuation. He has already worked with the Education officer, museum for reestablishing his Medicaid. He states he has about 15 days of Xarelto left at this time and is trying to obtain the orange card. He does describe having contact with social resources to continue his medical care. Dr. Lita Mains had ordered a ultrasound of the lower extremities for patient's history of known DVT. Per ultrasound this shows old clot with smaller cannulation. Patient is left lower extremity exam shows changes of chronic venous stasis and chronic edema. Patient does express knowledge of necessary follow-up with vascular for possible improved treatment and outcome. He did site of these resources have been provided to him. At this time and feel patient is stable for discharge. His vital signs are stable, his mental status is clear. He shows no signs of respiratory distress or cognitive dysfunction. He does have complication of chronic noncompliance and alcoholism, he does however exhibit insight and knowledge into his available resources for medical management.  Charlesetta Shanks, MD 05/15/15 [phone]

## 2015-05-15 NOTE — ED Notes (Signed)
Pt laying on side of bed. Talking to someone that is not present in the room. Pt is not of the phone.

## 2015-05-15 NOTE — ED Notes (Signed)
Pt refused to remove his clothes and put on a gown.

## 2015-07-05 ENCOUNTER — Emergency Department (HOSPITAL_COMMUNITY): Payer: Medicaid Other

## 2015-07-05 ENCOUNTER — Emergency Department (HOSPITAL_COMMUNITY)
Admission: EM | Admit: 2015-07-05 | Discharge: 2015-07-06 | Disposition: A | Discharge status: Home / Self Care | Payer: Medicaid Other | Attending: Emergency Medicine | Admitting: Emergency Medicine

## 2015-07-05 ENCOUNTER — Encounter (HOSPITAL_COMMUNITY): Payer: Self-pay | Admitting: Emergency Medicine

## 2015-07-05 DIAGNOSIS — I824Z2 Acute embolism and thrombosis of unspecified deep veins of left distal lower extremity: Secondary | ICD-10-CM | POA: Insufficient documentation

## 2015-07-05 DIAGNOSIS — Z86711 Personal history of pulmonary embolism: Secondary | ICD-10-CM | POA: Insufficient documentation

## 2015-07-05 DIAGNOSIS — Z720 Tobacco use: Secondary | ICD-10-CM | POA: Insufficient documentation

## 2015-07-05 DIAGNOSIS — Z8739 Personal history of other diseases of the musculoskeletal system and connective tissue: Secondary | ICD-10-CM | POA: Insufficient documentation

## 2015-07-05 DIAGNOSIS — I824Z9 Acute embolism and thrombosis of unspecified deep veins of unspecified distal lower extremity: Secondary | ICD-10-CM

## 2015-07-05 DIAGNOSIS — R079 Chest pain, unspecified: Secondary | ICD-10-CM | POA: Insufficient documentation

## 2015-07-05 DIAGNOSIS — R0602 Shortness of breath: Secondary | ICD-10-CM | POA: Insufficient documentation

## 2015-07-05 DIAGNOSIS — I2510 Atherosclerotic heart disease of native coronary artery without angina pectoris: Secondary | ICD-10-CM | POA: Insufficient documentation

## 2015-07-05 DIAGNOSIS — Z8659 Personal history of other mental and behavioral disorders: Secondary | ICD-10-CM | POA: Insufficient documentation

## 2015-07-05 DIAGNOSIS — R000 Tachycardia, unspecified: Secondary | ICD-10-CM | POA: Insufficient documentation

## 2015-07-05 DIAGNOSIS — Z862 Personal history of diseases of the blood and blood-forming organs and certain disorders involving the immune mechanism: Secondary | ICD-10-CM | POA: Insufficient documentation

## 2015-07-05 DIAGNOSIS — Z8619 Personal history of other infectious and parasitic diseases: Secondary | ICD-10-CM | POA: Insufficient documentation

## 2015-07-05 HISTORY — DX: Systemic involvement of connective tissue, unspecified: M35.9

## 2015-07-05 LAB — CBC WITH DIFFERENTIAL/PLATELET
BASOS ABS: 0 10*3/uL (ref 0.0–0.1)
BASOS PCT: 1 %
EOS PCT: 2 %
Eosinophils Absolute: 0.1 10*3/uL (ref 0.0–0.7)
HCT: 41 % (ref 39.0–52.0)
Hemoglobin: 13.4 g/dL (ref 13.0–17.0)
LYMPHS PCT: 28 %
Lymphs Abs: 1.3 10*3/uL (ref 0.7–4.0)
MCH: 33.8 pg (ref 26.0–34.0)
MCHC: 32.7 g/dL (ref 30.0–36.0)
MCV: 103.5 fL — AB (ref 78.0–100.0)
MONO ABS: 0.5 10*3/uL (ref 0.1–1.0)
Monocytes Relative: 10 %
Neutro Abs: 2.7 10*3/uL (ref 1.7–7.7)
Neutrophils Relative %: 59 %
PLATELETS: 121 10*3/uL — AB (ref 150–400)
RBC: 3.96 MIL/uL — ABNORMAL LOW (ref 4.22–5.81)
RDW: 16 % — AB (ref 11.5–15.5)
WBC: 4.6 10*3/uL (ref 4.0–10.5)

## 2015-07-05 LAB — BASIC METABOLIC PANEL
Anion gap: 12 (ref 5–15)
CALCIUM: 8.6 mg/dL — AB (ref 8.9–10.3)
CO2: 19 mmol/L — ABNORMAL LOW (ref 22–32)
CREATININE: 0.62 mg/dL (ref 0.61–1.24)
Chloride: 108 mmol/L (ref 101–111)
GFR calc Af Amer: >60 mL/min (ref >60)
Glucose, Bld: 79 mg/dL (ref 65–99)
POTASSIUM: 4.5 mmol/L (ref 3.5–5.1)
SODIUM: 139 mmol/L (ref 135–145)

## 2015-07-05 LAB — I-STAT CHEM 8, ED
BUN: <3 mg/dL — ABNORMAL LOW (ref 6–20)
Calcium, Ion: 1.03 mmol/L — ABNORMAL LOW (ref 1.12–1.23)
Chloride: 106 mmol/L (ref 101–111)
Creatinine, Ser: 0.8 mg/dL (ref 0.61–1.24)
GLUCOSE: 79 mg/dL (ref 65–99)
HCT: 47 % (ref 39.0–52.0)
Hemoglobin: 16 g/dL (ref 13.0–17.0)
POTASSIUM: 4.5 mmol/L (ref 3.5–5.1)
Sodium: 140 mmol/L (ref 135–145)
TCO2: 20 mmol/L (ref 0–100)

## 2015-07-05 LAB — I-STAT TROPONIN, ED: TROPONIN I, POC: 0.01 ng/mL (ref 0.00–0.08)

## 2015-07-05 LAB — ETHANOL: Alcohol, Ethyl (B): 156 mg/dL — ABNORMAL HIGH (ref <5)

## 2015-07-05 MED ORDER — IOHEXOL 350 MG/ML SOLN
80.0000 mL | Freq: Once | INTRAVENOUS | Status: AC | PRN
  Administered 2015-07-05: 80 mL via INTRAVENOUS

## 2015-07-05 MED ORDER — XARELTO VTE STARTER PACK 15 & 20 MG PO TBPK: 15.0000 mg | ORAL_TABLET | ORAL | Status: DC

## 2015-07-05 NOTE — ED Notes (Signed)
Pt to ED via [facility] with c /o DVT in left upper leg.  Pt st's he has been out of his Alen Blew for past 3 weeks and does not have the money to get it.  Pt admits to ETOH today.

## 2015-07-05 NOTE — ED Provider Notes (Signed)
CSN: [id number]     Arrival date & time 07/05/15  1640 History   First MD Initiated Contact with Patient 07/05/15 1644     Chief Complaint  Patient presents with  . Leg Pain     (Consider location/radiation/quality/duration/timing/severity/associated sxs/prior Treatment) Patient is a 49 y.o. male presenting with leg pain. The history is provided by the patient.  Leg Pain Location:  Leg Time since incident:  3 weeks Injury: no   Leg location:  L upper leg Pain details:    Quality:  Aching   Radiates to:  Does not radiate   Severity:  Moderate   Onset quality:  Gradual   Duration:  3 weeks   Timing:  Constant   Progression:  Worsening Chronicity:  Chronic Prior injury to area:  No Relieved by:  None tried Worsened by:  Activity and bearing weight Ineffective treatments:  None tried Associated symptoms: swelling   Associated symptoms: no back pain, no decreased ROM, no fatigue, no fever, no itching, no muscle weakness, no neck pain, no numbness, no stiffness and no tingling   Risk factors: recent illness   Risk factors: no frequent fractures and no obesity   Risk factors comment:  Hx of chronic DVT   Past Medical History  Diagnosis Date  . Coronary artery disease   . Degenerative joint disease of spine   . Pulmonary embolism ([place]) 06/2010  . Degenerative joint disease   . Lupus anticoagulant disorder ([place]) 02/02/2012  . ETOH abuse   . DVT (deep venous thrombosis) ([place])   . Hepatitis B   . Mental disorder   . Depression   . Collagen vascular disease [facility])    Past Surgical History  Procedure Laterality Date  . Left elbow surgery    . Tonsillectomy     Family History  Problem Relation Age of Onset  . Cancer Mother     Ovarian   Social History  Substance Use Topics  . Smoking status: Current Every Day Smoker -- 2.00 packs/day for 30 years    Types: Cigarettes  . Smokeless tobacco: Never Used  . Alcohol Use: 14.4 oz/week    24 Cans of beer per week   Comment: cas of beer and fifth of vodka daily    Review of Systems  Constitutional: Negative for fever and fatigue.  HENT: Negative for facial swelling.   Respiratory: Positive for shortness of breath. Negative for wheezing.   Cardiovascular: Positive for chest pain and leg swelling.  Gastrointestinal: Negative for abdominal pain.  Genitourinary: Negative for dysuria.  Musculoskeletal: Negative for back pain, stiffness and neck pain.  Skin: Negative for itching and rash.  Neurological: Negative for headaches.  Psychiatric/Behavioral: Negative for confusion.      Allergies  Review of patient's allergies indicates no known allergies.  Home Medications   Prior to Admission medications   Medication Sig Start Date End Date Taking? Authorizing Provider  acamprosate (CAMPRAL) 333 MG tablet Take 2 tablets (666 mg total) by mouth 3 (three) times daily with meals. For alcoholism Patient not taking: Reported on 05/15/2015 02/15/15   Shuvon B Rankin, NP  citalopram (CELEXA) 20 MG tablet Take 1 tablet (20 mg total) by mouth daily. For depression Patient not taking: Reported on 07/05/2015 02/15/15   Shuvon B Rankin, NP  hydrOXYzine (ATARAX/VISTARIL) 50 MG tablet Take 1 tablet (50 mg total) by mouth 3 (three) times daily as needed for anxiety or itching. Patient not taking: Reported on 07/05/2015 02/15/15   Shuvon B Rankin,  NP  XARELTO STARTER PACK 15 & 20 MG TBPK Take 20 mg by mouth as directed. 20 mg daily, per MD instructions 07/06/15   Hoyle Sauer, MD   BP 123/71 mmHg  Pulse 110  Temp(Src) 98 F (36.7 C) (Oral)  Resp 20  Ht 6' (1.829 m)  Wt 210 lb (95.255 kg)  BMI 28.47 kg/m2  SpO2 94% Physical Exam  Constitutional: He is oriented to person, place, and time. He appears well-developed and well-nourished. No distress.  HENT:  Head: Normocephalic and atraumatic.  Right Ear: External ear normal.  Left Ear: External ear normal.  Nose: Nose normal.  Mouth/Throat: Oropharynx is clear and  moist. No oropharyngeal exudate.  Eyes: Conjunctivae and EOM are normal. Pupils are equal, round, and reactive to light. Right eye exhibits no discharge. Left eye exhibits no discharge. No scleral icterus.  Neck: Normal range of motion. Neck supple. No JVD present. No tracheal deviation present. No thyromegaly present.  Cardiovascular: Regular rhythm and intact distal pulses.  Tachycardia present.   Pulmonary/Chest: Effort normal. No stridor. No respiratory distress. He has no wheezes. He has no rales. He exhibits no tenderness.  Abdominal: Soft. He exhibits no distension. There is no tenderness.  Musculoskeletal: Normal range of motion. He exhibits edema. He exhibits no tenderness.       Left upper leg: He exhibits swelling.  NVI in LLE.  Lymphadenopathy:    He has no cervical adenopathy.  Neurological: He is alert and oriented to person, place, and time.  Skin: Skin is warm and dry. No rash noted. He is not diaphoretic. No erythema. No pallor.  Psychiatric: He has a normal mood and affect. His behavior is normal. Judgment and thought content normal.  Nursing note and vitals reviewed.   ED Course  Procedures (including critical care time) Labs Review Labs Reviewed  CBC WITH DIFFERENTIAL/PLATELET - Abnormal; Notable for the following:    RBC 3.96 (*)    MCV 103.5 (*)    RDW 16.0 (*)    Platelets 121 (*)    All other components within normal limits  BASIC METABOLIC PANEL - Abnormal; Notable for the following:    CO2 19 (*)    BUN <5 (*)    Calcium 8.6 (*)    All other components within normal limits  ETHANOL - Abnormal; Notable for the following:    Alcohol, Ethyl (B) 156 (*)    All other components within normal limits  I-STAT CHEM 8, ED - Abnormal; Notable for the following:    BUN <3 (*)    Calcium, Ion 1.03 (*)    All other components within normal limits  I-STAT TROPOININ, ED    Imaging Review Ct Angio Chest Pe W/cm &/or Wo Cm  07/05/2015  CLINICAL DATA:  Left chest  pain, shortness of breath, history of PE/DVTs, IVC filter EXAM: CT ANGIOGRAPHY CHEST WITH CONTRAST TECHNIQUE: Multidetector CT imaging of the chest was performed using the standard protocol during bolus administration of intravenous contrast. Multiplanar CT image reconstructions and MIPs were obtained to evaluate the vascular anatomy. CONTRAST:  57mL OMNIPAQUE IOHEXOL 350 MG/ML SOLN COMPARISON:  CTA chest dated 06/24/2014 FINDINGS: No evidence of pulmonary embolism. Mediastinum/Nodes: Heart is normal in size. Coronary atherosclerosis. Small mediastinal lymph nodes which do not meet pathologic CT size criteria. Visualized thyroid is unremarkable. Lungs/Pleura: Calcified granuloma in the superior segment left lower lobe (series 8/ image 34). No suspicious pulmonary nodules. Mild scarring in the posteromedial right upper lobe (series 8/image 29). Biapical  pleural-parenchymal scarring. No focal consolidation. Mild paraseptal emphysematous changes. No pleural effusion or pneumothorax. Upper abdomen: Visualized upper abdomen is unremarkable. Musculoskeletal: Mild degenerative changes of the visualized thoracolumbar spine. Review of the MIP images confirms the above findings. IMPRESSION: No evidence of pulmonary embolism. No evidence of acute cardiopulmonary disease. Electronically Signed   By: Julian Hy M.D.   On: 07/05/2015 22:40   I have personally reviewed and evaluated these images and lab results as part of my medical decision-making.   EKG Interpretation   Date/Time:  Friday July 05 2015 21:33:47 EDT Ventricular Rate:  100 PR Interval:  163 QRS Duration: 90 QT Interval:  353 QTC Calculation: 455 R Axis:   91 Text Interpretation:  Sinus tachycardia Borderline right axis deviation ST  elev, probable normal early repol pattern No significant change since last  tracing Confirmed by YAO  MD, DAVID ([id number]) on 07/05/2015 11:05:09 PM      MDM   Final diagnoses:  Deep vein thrombosis (DVT)  of distal vein of lower extremity, unspecified chronicity, unspecified laterality ([place])    Patient has a history of a left lower leg DVT. Patient previously has been on anticoagulation but recently lost his insurance was unable to continue taking his XARELTO due to financial issues. Patient initially wasn't not complaining of chest pain or dyspnea however on repeat evaluation he endorsed these symptoms. We'll obtain a CT of the chest and attempt to find resources to get patient back on his Xarelto. PE study negative laboratory workup obtained shows no acute abnormalities requiring intervention at this time social work discussed situation with patient made arrangements for him to obtain another prescription at a nearby pharmacy free of cost. Patient otherwise stable at discharge ambulating without difficulty.  No other acute concerns at this time.  Patient was given return precautions for PE/DVT.  Pt advised on use of medications as applicable.  Advised to return for actely worsening symptoms, inability to take medications, or other acute concerns.  Advised to follow up with PCP in 1 weeks.  Patient was in agreement with and expressed understanding of follow plan, plan of care, and return precautions.  All questions answered prior to discharge.  Patient was discharged in stable condition, ambulating without difficulty.  Patient care was discussed with my attending, Dr. Darl Householder.   Hoyle Sauer, MD 07/07/15 6045  Wandra Arthurs, MD 07/08/15 [phone]

## 2015-07-05 NOTE — Care Management Note (Signed)
Case Management Note  Patient Details  Name: [name] MRN: [id number] Date of Birth: 1965-12-28  Subjective/Objective:     Patient presented to [facility] ED with left leg pain hx of DVT was on Xarelto last dose 3 weeks ago states, he lost insurance and cannot afford medication, chronic ETOH abuse and Homelessness.              Action/Plan: CM spoke with patient not sure if he has actually received the 30 day free trial start pack. CM discussed the importance of not stopping or missing doses of Xarelto. CM will contact 24 hr [facility] [address] to verify if the trial card has been processed. Spoke with Lanelle Bal at [facility] ran the bin number, patient is eligible for 30 day free trial Xarelto.[facility] [phone] [facility] at [facility] [address] once discharged. Discussed the [facility] for follow up, patient is agreeable. Spoke with patient regarding the importance of keeping follow up appointment Wednesday 11/2 at 12p. Patient verbalized understanding, teach back done.  Updated Dr.Brooten and Kim RN on [place] B on discharge plan. No further ED CM needs identified.  Expected Discharge Date:     07/05/15            Expected Discharge Plan:  Home/Self Care  In-House Referral:     Discharge planning Services  Medication Assistance, [facility], CM Consult  Post Acute Care Choice:    Choice offered to:   patient  DME Arranged:    DME Agency:     HH Arranged:    [place] Agency:     Status of Service:  Completed, signed off  Medicare Important Message Given:    Date Medicare IM Given:    Medicare IM give by:    Date Additional Medicare IM Given:    Additional Medicare Important Message give by:     If discussed at [place] of Stay Meetings, dates discussed:    Additional CommentsLaurena Slimmer, RN 07/05/2015, 8:22 PM

## 2015-07-06 MED ORDER — XARELTO VTE STARTER PACK 15 & 20 MG PO TBPK: 20.0000 mg | ORAL_TABLET | ORAL | Status: DC

## 2015-07-06 NOTE — Discharge Instructions (Signed)
Deep Vein Thrombosis °A deep vein thrombosis (DVT) is a blood clot (thrombus) that usually occurs in a deep, larger vein of the lower leg or the pelvis, or in an upper extremity such as the arm. These are dangerous and can lead to serious and even life-threatening complications if the clot travels to the lungs. °A DVT can damage the valves in your leg veins so that instead of flowing upward, the blood pools in the lower leg. This is called post-thrombotic syndrome, and it can result in pain, swelling, discoloration, and sores on the leg. °CAUSES °A DVT is caused by the formation of a blood clot in your leg, pelvis, or arm. Usually, several things contribute to the formation of blood clots. A clot may develop when: °· Your blood flow slows down. °· Your vein becomes damaged in some way. °· You have a condition that makes your blood clot more easily. °RISK FACTORS °A DVT is more likely to develop in: °· People who are older, especially over 60 years of age. °· People who are overweight (obese). °· People who sit or lie still for a long time, such as during long-distance travel (over 4 hours), bed rest, hospitalization, or during recovery from certain medical conditions like a stroke. °· People who do not engage in much physical activity (sedentary lifestyle). °· People who have chronic breathing disorders. °· People who have a personal or family history of blood clots or blood clotting disease. °· People who have peripheral vascular disease (PVD), diabetes, or some types of cancer. °· People who have heart disease, especially if the person had a recent heart attack or has congestive heart failure. °· People who have neurological diseases that affect the legs (leg paresis). °· People who have had a traumatic injury, such as breaking a hip or leg. °· People who have recently had major or lengthy surgery, especially on the hip, knee, or abdomen. °· People who have had a central line placed inside a large vein. °· People  who take medicines that contain the hormone estrogen. These include birth control pills and hormone replacement therapy. °· Pregnancy or during childbirth or the postpartum period. °· Long plane flights (over 8 hours). °SIGNS AND SYMPTOMS °Symptoms of a DVT can include:  °· Swelling of your leg or arm, especially if one side is much worse. °· Warmth and redness of your leg or arm, especially if one side is much worse. °· Pain in your arm or leg. If the clot is in your leg, symptoms may be more noticeable or worse when you stand or walk. °· A feeling of pins and needles, if the clot is in the arm. °The symptoms of a DVT that has traveled to the lungs (pulmonary embolism, PE) usually start suddenly and include: °· Shortness of breath while active or at rest. °· Coughing or coughing up blood or blood-tinged mucus. °· Chest pain that is often worse with deep breaths. °· Rapid or irregular heartbeat. °· Feeling light-headed or dizzy. °· Fainting. °· Feeling anxious. °· Sweating. °There may also be pain and swelling in a leg if that is where the blood clot started. °These symptoms may represent a serious problem that is an emergency. Do not wait to see if the symptoms will go away. Get medical help right away. Call your local emergency services (911 in the [place] Do not drive yourself to the hospital. °DIAGNOSIS °Your health care provider will take a medical history and perform a physical exam. You may also   have other tests, including: °· Blood tests to assess the clotting properties of your blood. °· Imaging tests, such as CT, ultrasound, MRI, X-ray, and other tests to see if you have clots anywhere in your body. °TREATMENT °After a DVT is identified, it can be treated. The type of treatment that you receive depends on many factors, such as the cause of your DVT, your risk for bleeding or developing more clots, and other medical conditions that you have. Sometimes, a combination of treatments is necessary. Treatment  options may be combined and include: °· Monitoring the blood clot with ultrasound. °· Taking medicines by mouth, such as newer blood thinners (anticoagulants), thrombolytics, or warfarin. °· Taking anticoagulant medicine by injection or through an IV tube. °· Wearing compression stockings or using different types of devices. °· Surgery (rare) to remove the blood clot or to place a filter in your abdomen to stop the blood clot from traveling to your lungs. °Treatments for a DVT are often divided into immediate treatment and long-term treatment (up to 3 months after DVT). You can work with your health care provider to choose the treatment program that is best for you. °HOME CARE INSTRUCTIONS °If you are taking a newer oral anticoagulant: °· Take the medicine every single day at the same time each day. °· Understand what foods and drugs interact with this medicine. °· Understand that there are no regular blood tests required when using this medicine. °· Understand the side effects of this medicine, including excessive bruising or bleeding. Ask your health care provider or pharmacist about other possible side effects. °If you are taking warfarin: °· Understand how to take warfarin and know which foods can affect how warfarin works in your body. °· Understand that it is dangerous to take too much or too little warfarin. Too much warfarin increases the risk of bleeding. Too little warfarin continues to allow the risk for blood clots. °· Follow your PT and INR blood testing schedule. The PT and INR results allow your health care provider to adjust your dose of warfarin. It is very important that you have your PT and INR tested as often as told by your health care provider. °· Avoid major changes in your diet, or tell your health care provider before you change your diet. Arrange a visit with a registered dietitian to answer your questions. Many foods, especially foods that are high in vitamin K, can interfere with warfarin  and affect the PT and INR results. Eat a consistent amount of foods that are high in vitamin K, such as: °¨ Spinach, kale, broccoli, cabbage, collard greens, turnip greens, Brussels sprouts, peas, cauliflower, seaweed, and parsley. °¨ Beef liver and pork liver. °¨ Green tea. °¨ Soybean oil. °· Tell your health care provider about any and all medicines, vitamins, and supplements that you take, including aspirin and other over-the-counter anti-inflammatory medicines. Be especially cautious with aspirin and anti-inflammatory medicines. Do not take those before you ask your health care provider if it is safe to do so. This is important because many medicines can interfere with warfarin and affect the PT and INR results. °· Do not start or stop taking any over-the-counter or prescription medicine unless your health care provider or pharmacist tells you to do so. °If you take warfarin, you will also need to do these things: °· Hold pressure over cuts for longer than usual. °· Tell your dentist and other health care providers that you are taking warfarin before you have any procedures in which   bleeding may occur. °· Avoid alcohol or drink very small amounts. Tell your health care provider if you change your alcohol intake. °· Do not use tobacco products, including cigarettes, chewing tobacco, and e-cigarettes. If you need help quitting, ask your health care provider. °· Avoid contact sports. °General Instructions °· Take over-the-counter and prescription medicines only as told by your health care provider. Anticoagulant medicines can have side effects, including easy bruising and difficulty stopping bleeding. If you are prescribed an anticoagulant, you will also need to do these things: °¨ Hold pressure over cuts for longer than usual. °¨ Tell your dentist and other health care providers that you are taking anticoagulants before you have any procedures in which bleeding may occur. °¨ Avoid contact sports. °· Wear a medical  alert bracelet or carry a medical alert card that says you have had a PE. °· Ask your health care provider how soon you can go back to your normal activities. Stay active to prevent new blood clots from forming. °· Make sure to exercise while traveling or when you have been sitting or standing for a long period of time. It is very important to exercise. Exercise your legs by walking or by tightening and relaxing your leg muscles often. Take frequent walks. °· Wear compression stockings as told by your health care provider to help prevent more blood clots from forming. °· Do not use tobacco products, including cigarettes, chewing tobacco, and e-cigarettes. If you need help quitting, ask your health care provider. °· Keep all follow-up appointments with your health care provider. This is important. °PREVENTION °Take these actions to decrease your risk of developing another DVT: °· Exercise regularly. For at least 30 minutes every day, engage in: °¨ Activity that involves moving your arms and legs. °¨ Activity that encourages good blood flow through your body by increasing your heart rate. °· Exercise your arms and legs every hour during long-distance travel (over 4 hours). Drink plenty of water and avoid drinking alcohol while traveling. °· Avoid sitting or lying in bed for long periods of time without moving your legs. °· Maintain a weight that is appropriate for your height. Ask your health care provider what weight is healthy for you. °· If you are a woman who is over 35 years of age, avoid unnecessary use of medicines that contain estrogen. These include birth control pills. °· Do not smoke, especially if you take estrogen medicines. If you need help quitting, ask your health care provider. °If you are hospitalized, prevention measures may include: °· Early walking after surgery, as soon as your health care provider says that it is safe. °· Receiving anticoagulants to prevent blood clots. If you cannot take  anticoagulants, other options may be available, such as wearing compression stockings or using different types of devices. °SEEK IMMEDIATE MEDICAL CARE IF: °· You have new or increased pain, swelling, or redness in an arm or leg. °· You have numbness or tingling in an arm or leg. °· You have shortness of breath while active or at rest. °· You have chest pain. °· You have a rapid or irregular heartbeat. °· You feel light-headed or dizzy. °· You cough up blood. °· You notice blood in your vomit, bowel movement, or urine. °These symptoms may represent a serious problem that is an emergency. Do not wait to see if the symptoms will go away. Get medical help right away. Call your local emergency services (911 in the [place] Do not drive yourself to the hospital. °  °  This information is not intended to replace advice given to you by your health care provider. Make sure you discuss any questions you have with your health care provider. °  °Document Released: 08/24/2005 Document Revised: 05/15/2015 Document Reviewed: 12/19/2014 °Elsevier Interactive Patient Education ©2016 [employer] ° °

## 2015-07-10 ENCOUNTER — Ambulatory Visit: Payer: Self-pay | Admitting: Cardiology

## 2015-07-17 ENCOUNTER — Ambulatory Visit: Payer: Self-pay | Admitting: Cardiology

## 2015-08-07 ENCOUNTER — Telehealth: Payer: Self-pay | Admitting: Cardiology

## 2015-08-07 ENCOUNTER — Ambulatory Visit: Payer: Self-pay | Admitting: Cardiology

## 2015-08-07 NOTE — Telephone Encounter (Signed)
Called [facility] nurse at the [facility], [place] to check on patient. Left a voicemail asking to return call at [phone].

## 2015-08-24 ENCOUNTER — Encounter (HOSPITAL_COMMUNITY): Payer: Self-pay | Admitting: *Deleted

## 2015-08-24 ENCOUNTER — Emergency Department (HOSPITAL_COMMUNITY): Payer: Medicaid Other

## 2015-08-24 ENCOUNTER — Inpatient Hospital Stay (HOSPITAL_COMMUNITY)
Admission: EM | Admit: 2015-08-24 | Discharge: 2015-08-26 | DRG: 202 | Disposition: A | Discharge status: Home / Self Care | Payer: Self-pay | Attending: Emergency Medicine | Admitting: Emergency Medicine

## 2015-08-24 ENCOUNTER — Emergency Department (HOSPITAL_COMMUNITY): Payer: Self-pay

## 2015-08-24 DIAGNOSIS — J189 Pneumonia, unspecified organism: Secondary | ICD-10-CM | POA: Insufficient documentation

## 2015-08-24 DIAGNOSIS — Z79899 Other long term (current) drug therapy: Secondary | ICD-10-CM

## 2015-08-24 DIAGNOSIS — D6862 Lupus anticoagulant syndrome: Secondary | ICD-10-CM | POA: Diagnosis present

## 2015-08-24 DIAGNOSIS — A419 Sepsis, unspecified organism: Secondary | ICD-10-CM | POA: Insufficient documentation

## 2015-08-24 DIAGNOSIS — R000 Tachycardia, unspecified: Secondary | ICD-10-CM | POA: Diagnosis present

## 2015-08-24 DIAGNOSIS — F319 Bipolar disorder, unspecified: Secondary | ICD-10-CM | POA: Diagnosis present

## 2015-08-24 DIAGNOSIS — J9601 Acute respiratory failure with hypoxia: Secondary | ICD-10-CM

## 2015-08-24 DIAGNOSIS — J209 Acute bronchitis, unspecified: Principal | ICD-10-CM | POA: Diagnosis present

## 2015-08-24 DIAGNOSIS — I2510 Atherosclerotic heart disease of native coronary artery without angina pectoris: Secondary | ICD-10-CM | POA: Diagnosis present

## 2015-08-24 DIAGNOSIS — R0902 Hypoxemia: Secondary | ICD-10-CM | POA: Diagnosis present

## 2015-08-24 DIAGNOSIS — E872 Acidosis: Secondary | ICD-10-CM | POA: Diagnosis present

## 2015-08-24 DIAGNOSIS — F17210 Nicotine dependence, cigarettes, uncomplicated: Secondary | ICD-10-CM | POA: Diagnosis present

## 2015-08-24 DIAGNOSIS — Z8041 Family history of malignant neoplasm of ovary: Secondary | ICD-10-CM

## 2015-08-24 DIAGNOSIS — I959 Hypotension, unspecified: Secondary | ICD-10-CM | POA: Diagnosis present

## 2015-08-24 DIAGNOSIS — B1910 Unspecified viral hepatitis B without hepatic coma: Secondary | ICD-10-CM | POA: Diagnosis present

## 2015-08-24 DIAGNOSIS — M479 Spondylosis, unspecified: Secondary | ICD-10-CM | POA: Diagnosis present

## 2015-08-24 DIAGNOSIS — Z86711 Personal history of pulmonary embolism: Secondary | ICD-10-CM

## 2015-08-24 DIAGNOSIS — Z590 Homelessness: Secondary | ICD-10-CM

## 2015-08-24 LAB — BASIC METABOLIC PANEL
ANION GAP: 12 (ref 5–15)
BUN: 10 mg/dL (ref 6–20)
CALCIUM: 8.6 mg/dL — AB (ref 8.9–10.3)
CO2: 23 mmol/L (ref 22–32)
CREATININE: 0.71 mg/dL (ref 0.61–1.24)
Chloride: 97 mmol/L — ABNORMAL LOW (ref 101–111)
Glucose, Bld: 92 mg/dL (ref 65–99)
Potassium: 3.8 mmol/L (ref 3.5–5.1)
SODIUM: 132 mmol/L — AB (ref 135–145)

## 2015-08-24 LAB — CBC
HCT: 43.3 % (ref 39.0–52.0)
HEMOGLOBIN: 14.5 g/dL (ref 13.0–17.0)
MCH: 34.2 pg — AB (ref 26.0–34.0)
MCHC: 33.5 g/dL (ref 30.0–36.0)
MCV: 102.1 fL — ABNORMAL HIGH (ref 78.0–100.0)
PLATELETS: 134 10*3/uL — AB (ref 150–400)
RBC: 4.24 MIL/uL (ref 4.22–5.81)
RDW: 14.2 % (ref 11.5–15.5)
WBC: 8.6 10*3/uL (ref 4.0–10.5)

## 2015-08-24 LAB — URINALYSIS, ROUTINE W REFLEX MICROSCOPIC
BILIRUBIN URINE: NEGATIVE
GLUCOSE, UA: NEGATIVE mg/dL
Hgb urine dipstick: NEGATIVE
KETONES UR: NEGATIVE mg/dL
LEUKOCYTES UA: NEGATIVE
NITRITE: NEGATIVE
PROTEIN: NEGATIVE mg/dL
Specific Gravity, Urine: 1.006 (ref 1.005–1.030)
pH: 5.5 (ref 5.0–8.0)

## 2015-08-24 LAB — I-STAT CG4 LACTIC ACID, ED
Lactic Acid, Venous: 5.21 mmol/L (ref 0.5–2.0)
Lactic Acid, Venous: 3.66 mmol/L (ref 0.5–2.0)

## 2015-08-24 LAB — I-STAT TROPONIN, ED: TROPONIN I, POC: 0 ng/mL (ref 0.00–0.08)

## 2015-08-24 MED ORDER — IPRATROPIUM-ALBUTEROL 0.5-2.5 (3) MG/3ML IN SOLN: 3.0000 mL | Freq: Four times a day (QID) | RESPIRATORY_TRACT | Status: DC | PRN

## 2015-08-24 MED ORDER — SODIUM CHLORIDE 0.9 % IV SOLN: 250.0000 mL | INTRAVENOUS | Status: DC | PRN

## 2015-08-24 MED ORDER — SODIUM CHLORIDE 0.9 % IV BOLUS (SEPSIS)
1000.0000 mL | Freq: Once | INTRAVENOUS | Status: AC
  Administered 2015-08-25: 1000 mL via INTRAVENOUS

## 2015-08-24 MED ORDER — LEVOFLOXACIN 500 MG PO TABS
500.0000 mg | ORAL_TABLET | Freq: Every day | ORAL | Status: DC
  Administered 2015-08-25: 500 mg via ORAL
  Filled 2015-08-24 (×2): qty 1

## 2015-08-24 MED ORDER — LEVOFLOXACIN IN D5W 750 MG/150ML IV SOLN
750.0000 mg | Freq: Once | INTRAVENOUS | Status: AC
  Administered 2015-08-24: 750 mg via INTRAVENOUS
  Filled 2015-08-24: qty 150

## 2015-08-24 MED ORDER — SODIUM CHLORIDE 0.9 % IV BOLUS (SEPSIS)
1000.0000 mL | Freq: Once | INTRAVENOUS | Status: AC
  Administered 2015-08-24: 1000 mL via INTRAVENOUS

## 2015-08-24 MED ORDER — IOHEXOL 350 MG/ML SOLN
100.0000 mL | Freq: Once | INTRAVENOUS | Status: AC | PRN
  Administered 2015-08-24: 100 mL via INTRAVENOUS

## 2015-08-24 MED ORDER — SODIUM CHLORIDE 0.9 % IV BOLUS (SEPSIS)
3000.0000 mL | Freq: Once | INTRAVENOUS | Status: AC
  Administered 2015-08-24: 3000 mL via INTRAVENOUS

## 2015-08-24 MED ORDER — RIVAROXABAN 20 MG PO TABS
20.0000 mg | ORAL_TABLET | Freq: Every day | ORAL | Status: DC
  Administered 2015-08-25 – 2015-08-26 (×2): 20 mg via ORAL
  Filled 2015-08-24 (×2): qty 1

## 2015-08-24 MED ORDER — ALBUTEROL (5 MG/ML) CONTINUOUS INHALATION SOLN
10.0000 mg/h | INHALATION_SOLUTION | Freq: Once | RESPIRATORY_TRACT | Status: AC
  Administered 2015-08-24: 10 mg/h via RESPIRATORY_TRACT
  Filled 2015-08-24: qty 20

## 2015-08-24 NOTE — ED Notes (Signed)
Pt refused rectal temp.

## 2015-08-24 NOTE — ED Notes (Addendum)
Per EMS, pt complains of cough for the past 5 days, pain in ribs from coughing. Pt states he is coughing up green mucus. Pt denies shortness of breath. Pt states he has been drinking alcohol. Pt states he has been unable to take his medications as prescribed due to not being able to afford them. Pt has only been taking his xeralto once a week instead of once a day for DVT.

## 2015-08-24 NOTE — ED Provider Notes (Signed)
CSN: [id number]     Arrival date & time 08/24/15  1551 History   First MD Initiated Contact with Patient 08/24/15 1639     Chief Complaint  Patient presents with  . Cough  . Shortness of Breath     (Consider location/radiation/quality/duration/timing/severity/associated sxs/prior Treatment) HPI   [name] is a 49 y.o. male who presents for evaluation of cough with copious sputum production, for 2 weeks. He continues to smoke cigarettes. He is unable to take his medications because of financial difficulty. He is homeless. He denies chest pain, weakness or dizziness. There are no other known modifying factors.   Past Medical History  Diagnosis Date  . Coronary artery disease   . Degenerative joint disease of spine   . Pulmonary embolism ([place]) 06/2010  . Degenerative joint disease   . 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 ETOH abuse   . DVT (deep venous thrombosis) ([place])   . Hepatitis B   . Mental disorder   . Depression   . Collagen vascular disease [facility])    Past Surgical History  Procedure Laterality Date  . Left elbow surgery    . Tonsillectomy     Family History  Problem Relation Age of Onset  . Cancer Mother     Ovarian   Social History  Substance Use Topics  . Smoking status: Current Every Day Smoker -- 2.00 packs/day for 30 years    Types: Cigarettes  . Smokeless tobacco: Never Used  . Alcohol Use: 14.4 oz/week    24 Cans of beer per week     Comment: cas of beer and fifth of vodka daily    Review of Systems  All other systems reviewed and are negative.     Allergies  Review of patient's allergies indicates no known allergies.  Home Medications   Prior to Admission medications   Medication Sig Start Date End Date Taking? Authorizing Provider  XARELTO 20 MG TABS tablet Take 20 mg by mouth once a week.  07/05/15  Yes Historical Provider, MD  acamprosate (CAMPRAL) 333 MG tablet Take 2 tablets (666 mg total) by mouth 3 (three) times daily  with meals. For alcoholism Patient not taking: Reported on 05/15/2015 02/15/15   Shuvon B Rankin, NP  citalopram (CELEXA) 20 MG tablet Take 1 tablet (20 mg total) by mouth daily. For depression Patient not taking: Reported on 07/05/2015 02/15/15   Shuvon B Rankin, NP  XARELTO STARTER PACK 15 & 20 MG TBPK Take 20 mg by mouth as directed. 20 mg daily, per MD instructions Patient not taking: Reported on 08/24/2015 07/06/15   Hoyle Sauer, MD   BP 99/59 mmHg  Pulse 109  Temp(Src) 97.9 F (36.6 C) (Oral)  Resp 16  SpO2 93% Physical Exam  Constitutional: He is oriented to person, place, and time. He appears well-developed and well-nourished. No distress.  HENT:  Head: Normocephalic and atraumatic.  Right Ear: External ear normal.  Left Ear: External ear normal.  Eyes: Conjunctivae and EOM are normal. Pupils are equal, round, and reactive to light.  Neck: Normal range of motion and phonation normal. Neck supple.  Cardiovascular: Normal rate, regular rhythm and normal heart sounds.   Pulmonary/Chest: Effort normal. No respiratory distress. He has no rales. He exhibits no bony tenderness.  Decreased air movement bilaterally, with scattered end-expiratory wheezes. Few scattered rhonchi.  Abdominal: Soft. There is no tenderness.  Musculoskeletal: Normal range of motion.  Neurological: He is alert and oriented to person, place, and time.  No cranial nerve deficit or sensory deficit. He exhibits normal muscle tone. Coordination normal.  Skin: Skin is warm, dry and intact.  Psychiatric: He has a normal mood and affect. His behavior is normal. Judgment and thought content normal.  Nursing note and vitals reviewed.   ED Course  Procedures (including critical care time)  Medications  ipratropium-albuterol (DUONEB) 0.5-2.5 (3) MG/3ML nebulizer solution 3 mL (not administered)  albuterol (PROVENTIL,VENTOLIN) solution continuous neb (10 mg/hr Nebulization Given 08/24/15 1709)  sodium chloride 0.9 %  bolus 3,000 mL (0 mLs Intravenous Stopped 08/24/15 2155)  iohexol (OMNIPAQUE) 350 MG/ML injection 100 mL (100 mLs Intravenous Contrast Given 08/24/15 2104)  levofloxacin (LEVAQUIN) IVPB 750 mg (750 mg Intravenous New Bag/Given 08/24/15 2155)  sodium chloride 0.9 % bolus 1,000 mL (1,000 mLs Intravenous New Bag/Given 08/24/15 2231)    Patient Vitals for the past 24 hrs:  BP Temp Temp src Pulse Resp SpO2  08/24/15 2245 - - - 109 16 93 %  08/24/15 2230 99/59 mmHg - - 117 22 -  08/24/15 2100 (!) 98/52 mmHg - - 111 - 95 %  08/24/15 2000 112/59 mmHg - - 105 (!) 27 95 %  08/24/15 1953 (!) 117/54 mmHg - - 107 22 94 %  08/24/15 1932 (!) 71/59 mmHg - - 110 16 98 %  08/24/15 1900 (!) 64/51 mmHg - - 114 (!) 31 -  08/24/15 1830 (!) 70/51 mmHg - - 114 26 96 %  08/24/15 1800 (!) 85/44 mmHg - - 106 14 100 %  08/24/15 1749 95/59 mmHg - - 99 12 100 %  08/24/15 1730 (!) 81/51 mmHg - - 85 16 100 %  08/24/15 1719 97/66 mmHg - - 76 12 100 %  08/24/15 1710 - - - - - 92 %  08/24/15 1613 92/64 mmHg - - - - 93 %  08/24/15 1610 - 97.9 F (36.6 C) Oral 97 18 -     9:49 PM Reevaluation with update and discussion. After initial assessment and treatment, an updated evaluation reveals he is alert, calm, comfortable. Blood pressure 117/54. He complains of pain in the bilateral flank.[name]   22:05- Discussed with PCCM, Dr. Janann Colonel. He will have a colleague see the patient for admission   CRITICAL CARE Performed by: [name] Total critical care time: 40 minutes minutes Critical care time was exclusive of separately billable procedures and treating other patients. Critical care was necessary to treat or prevent imminent or life-threatening deterioration. Critical care was time spent personally by me on the following activities: development of treatment plan with patient and/or surrogate as well as nursing, discussions with consultants, evaluation of patient's response to treatment, examination of  patient, obtaining history from patient or surrogate, ordering and performing treatments and interventions, ordering and review of laboratory studies, ordering and review of radiographic studies, pulse oximetry and re-evaluation of patient's condition.   Labs Review Labs Reviewed  BASIC METABOLIC PANEL - Abnormal; Notable for the following:    Sodium 132 (*)    Chloride 97 (*)    Calcium 8.6 (*)    All other components within normal limits  CBC - Abnormal; Notable for the following:    MCV 102.1 (*)    MCH 34.2 (*)    Platelets 134 (*)    All other components within normal limits  I-STAT CG4 LACTIC ACID, ED - Abnormal; Notable for the following:    Lactic Acid, Venous 5.21 (*)    All other components within normal limits  I-STAT CG4 LACTIC ACID, ED - Abnormal; Notable for the following:    Lactic Acid, Venous 3.66 (*)    All other components within normal limits  CULTURE, BLOOD (ROUTINE X 2)  CULTURE, BLOOD (ROUTINE X 2)  URINE CULTURE  CULTURE, EXPECTORATED SPUTUM-ASSESSMENT  URINALYSIS, ROUTINE W REFLEX MICROSCOPIC (NOT AT [facility])  I-STAT TROPOININ, ED  I-STAT TROPOININ, ED    Imaging Review Dg Chest 2 View  08/24/2015  CLINICAL DATA:  Cough, congestion, shortness of breath, fever for 5 days, prior pulmonary embolism EXAM: CHEST  2 VIEW COMPARISON:  05/15/2015 Correlation:  CTA chest 07/05/2015 FINDINGS: Normal heart size, mediastinal contours, and pulmonary vascularity. Vague densities in the RIGHT mid lung correspond to healing fractures of the anterior RIGHT third and fourth ribs as noted on prior CT. Calcified granuloma posteriorly LEFT mid lung. No infiltrate, pleural effusion or pneumothorax. Bones unremarkable. IMPRESSION: No acute abnormalities. Electronically Signed   By: Lavonia Dana M.D.   On: 08/24/2015 17:50   Ct Angio Chest Pe W/cm &/or Wo Cm  08/24/2015  CLINICAL DATA:  Cough for 5 days.  Abnormal chest radiograph. EXAM: CT ANGIOGRAPHY CHEST WITH CONTRAST TECHNIQUE:  Multidetector CT imaging of the chest was performed using the standard protocol during bolus administration of intravenous contrast. Multiplanar CT image reconstructions and MIPs were obtained to evaluate the vascular anatomy. CONTRAST:  158mL OMNIPAQUE IOHEXOL 350 MG/ML SOLN COMPARISON:  07/05/2015. FINDINGS: Mediastinum: The heart size appears normal. The trachea appears patent and is midline. Normal appearance of the esophagus. No mediastinal or hilar adenopathy. No axillary or supraclavicular adenopathy. Despite 2 attempts at re- injection and scanning there is suboptimal pulmonary arterial opacification. Within this limitation there is no evidence for saddle embolus or central obstructing embolus. No lobar or segmental pulmonary artery filling defects identified to suggest a clinically significant acute pulmonary embolus. Lungs/Pleura: No pleural fluid. Calcified granuloma noted in the superior segment of the left lower lobe. Scar like density in the lateral right lower lobe is again noted. There is a developing nodular density within this area which measures 9 mm, nonspecific. Upper Abdomen: Hepatic steatosis noted. No focal liver abnormality noted. The visualized portions of the spleen appear normal Musculoskeletal: No aggressive lytic or sclerotic bone lesions. Mild spondylosis noted within the thoracic spine. Right anterior fifth and sixth rib fractures appears chronic. Review of the MIP images confirms the above findings. IMPRESSION: 1. Sub optimal pulmonary arterial opacification. Within this limitation, there is no evidence for acute pulmonary embolus. 2. There is a peripheral nodular opacity in 8 previous area of scarring within the periphery of the right lung base. This may be post inflammatory or infectious. A followup examination in 3 months is recommended to ensure resolution. Electronically Signed   By: Kerby Moors M.D.   On: 08/24/2015 21:47   I have personally reviewed and evaluated these  images and lab results as part of my medical decision-making.   EKG Interpretation   Date/Time:  Saturday August 24 2015 16:20:14 EST Ventricular Rate:  85 PR Interval:  166 QRS Duration: 94 QT Interval:  378 QTC Calculation: 449 R Axis:   89 Text Interpretation:  Sinus rhythm Inferior infarct, acute (LCx) Minimal  ST elevation, anterior leads Lateral leads are also involved since last  tracing no significant change Confirmed by Eulis Foster  MD, Vira Agar [id number]) on  08/24/2015 4:39:44 PM      MDM   Final diagnoses:  CAP (community acquired pneumonia)  Sepsis, due to unspecified organism ([place])  Cough without radiographic evidence for pneumonia, and laboratory evaluation concerning for sepsis. Suspect occult pneumonia as source. Patient may be mildly dehydrated, and should have follow-up chest x-ray tomorrow after hydration. Patient remained with normal mentation during the ED stay. Blood pressure was transiently responsive to intravenous saline boluses, and require repeat bolus for episodes of hypotension.  Nursing Notes Reviewed/ Care Coordinated, and agree without changes. Applicable Imaging Reviewed.  Interpretation of Laboratory Data incorporated into ED treatment  Plan: Admit    Daleen Bo, MD 08/24/15 [phone]

## 2015-08-24 NOTE — H&P (Addendum)
PULMONARY / CRITICAL CARE MEDICINE History and Physical  Name: [name] MRN: [id number] DOB: Oct 07, 1965    ADMISSION DATE:  08/24/2015 CONSULTATION DATE:  08/24/15  REFERRING MD:  Dr Christ Kick, MD  CHIEF COMPLAINT:  "I have a bad cough"  HISTORY OF PRESENT ILLNESS:   [name] is a 49 y.o. 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 For the past week, he says that he had a bad cough with thick, green sputum production, intermittent hot/cold spells and fatigue. Over the past day, his cough caused the sides of his chest to hurt and prompted visit to the ED. Here, a CTA Chest revealed no large colidations nor signs of acute PE. He was given 2 liters of normal saline for hypotension and a lactate of 5.21. He felt better after the fluids and currently is concerned about not being able to afford any of his medications - particularly his rivaroxaban which he is taking one table a week (instead of daily) after losing Medicaid earlier this year. He has nausea in the mornings with occasional clear emesis. No other findings on ROS. He continues to smoke 2 packs per day of cigarrettes but denies any drug use. [place]   PAST MEDICAL HISTORY :  He  has a past medical history of Coronary artery disease; Degenerative joint disease of spine; Pulmonary embolism ([place]) (06/2010); Degenerative joint disease; Lupus anticoagulant disorder ([place]) (02/02/2012); ETOH abuse; DVT (deep venous thrombosis) ([place]); Hepatitis B; Mental disorder; Depression; and Collagen vascular disease ([place]).  PAST SURGICAL HISTORY: He  has past surgical history that includes LEFT ELBOW SURGERY and Tonsillectomy.  No Known Allergies  No current facility-administered medications on file prior to encounter.   Current Outpatient Prescriptions on File Prior to  Encounter  Medication Sig  . acamprosate (CAMPRAL) 333 MG tablet Take 2 tablets (666 mg total) by mouth 3 (three) times daily with meals. For alcoholism (Patient not taking: Reported on 05/15/2015)  . citalopram (CELEXA) 20 MG tablet Take 1 tablet (20 mg total) by mouth daily. For depression (Patient not taking: Reported on 07/05/2015)  . XARELTO STARTER PACK 15 & 20 MG TBPK Take 20 mg by mouth as directed. 20 mg daily, per MD instructions (Patient not taking: Reported on 08/24/2015)    FAMILY HISTORY:  His indicated that his mother is deceased. He indicated that his father is alive. He indicated that both of his sisters are alive. He indicated that both of his brothers are alive.   SOCIAL HISTORY: He  reports that he has been smoking Cigarettes.  He has a 60 pack-year smoking history. He has never used smokeless tobacco. He reports that he drinks about 14.4 oz of alcohol per week. He reports that he does not use illicit drugs.  REVIEW OF SYSTEMS:   12 point ROS negative aside from HPI   VITAL SIGNS: BP 98/52 mmHg  Pulse 111  Temp(Src) 97.9 F (36.6 C) (Oral)  Resp 27  SpO2 95%   PHYSICAL EXAMINATION: General:  Alert, oriented, no acute distress, comfortable on examination bed Neuro:  No focal deficits HEENT:  Dry mucous membranes, clear oropharynx, normal conjunctiva Cardiovascular:  Tachycardic, regular rhythm, no murmurs appreciated Lungs:  CTAB, no appreciable wheezing present, no focal lung sounds Abdomen:  Soft, nontender, nondistended Musculoskeletal:  Missing half a digit in left hand, tenderness to  palpation over chest sides bilaterally from coughing Skin:  Hyperpigmented areas over both legs  LABS:  BMET  Recent Labs Lab 08/24/15 1656  NA 132*  K 3.8  CL 97*  CO2 23  BUN 10  CREATININE 0.71  GLUCOSE 92    Electrolytes  Recent Labs Lab 08/24/15 1656  CALCIUM 8.6*    CBC  Recent Labs Lab 08/24/15 1656  WBC 8.6  HGB 14.5  HCT 43.3  PLT 134*     Coag's No results for input(s): APTT, INR in the last 168 hours.  Sepsis Markers  Recent Labs Lab 08/24/15 1957  LATICACIDVEN 5.21*    ABG No results for input(s): PHART, PCO2ART, PO2ART in the last 168 hours.  Liver Enzymes No results for input(s): AST, ALT, ALKPHOS, BILITOT, ALBUMIN in the last 168 hours.  Cardiac Enzymes No results for input(s): TROPONINI, PROBNP in the last 168 hours.  Glucose No results for input(s): GLUCAP in the last 168 hours.  Imaging Dg Chest 2 View  08/24/2015  CLINICAL DATA:  Cough, congestion, shortness of breath, fever for 5 days, prior pulmonary embolism EXAM: CHEST  2 VIEW COMPARISON:  05/15/2015 Correlation:  CTA chest 07/05/2015 FINDINGS: Normal heart size, mediastinal contours, and pulmonary vascularity. Vague densities in the RIGHT mid lung correspond to healing fractures of the anterior RIGHT third and fourth ribs as noted on prior CT. Calcified granuloma posteriorly LEFT mid lung. No infiltrate, pleural effusion or pneumothorax. Bones unremarkable. IMPRESSION: No acute abnormalities. Electronically Signed   By: Lavonia Dana M.D.   On: 08/24/2015 17:50   Ct Angio Chest Pe W/cm &/or Wo Cm  08/24/2015  CLINICAL DATA:  Cough for 5 days.  Abnormal chest radiograph. EXAM: CT ANGIOGRAPHY CHEST WITH CONTRAST TECHNIQUE: Multidetector CT imaging of the chest was performed using the standard protocol during bolus administration of intravenous contrast. Multiplanar CT image reconstructions and MIPs were obtained to evaluate the vascular anatomy. CONTRAST:  124mL OMNIPAQUE IOHEXOL 350 MG/ML SOLN COMPARISON:  07/05/2015. FINDINGS: Mediastinum: The heart size appears normal. The trachea appears patent and is midline. Normal appearance of the esophagus. No mediastinal or hilar adenopathy. No axillary or supraclavicular adenopathy. Despite 2 attempts at re- injection and scanning there is suboptimal pulmonary arterial opacification. Within this limitation  there is no evidence for saddle embolus or central obstructing embolus. No lobar or segmental pulmonary artery filling defects identified to suggest a clinically significant acute pulmonary embolus. Lungs/Pleura: No pleural fluid. Calcified granuloma noted in the superior segment of the left lower lobe. Scar like density in the lateral right lower lobe is again noted. There is a developing nodular density within this area which measures 9 mm, nonspecific. Upper Abdomen: Hepatic steatosis noted. No focal liver abnormality noted. The visualized portions of the spleen appear normal Musculoskeletal: No aggressive lytic or sclerotic bone lesions. Mild spondylosis noted within the thoracic spine. Right anterior fifth and sixth rib fractures appears chronic. Review of the MIP images confirms the above findings. IMPRESSION: 1. Sub optimal pulmonary arterial opacification. Within this limitation, there is no evidence for acute pulmonary embolus. 2. There is a peripheral nodular opacity in 8 previous area of scarring within the periphery of the right lung base. This may be post inflammatory or infectious. A followup examination in 3 months is recommended to ensure resolution. Electronically Signed   By: Kerby Moors M.D.   On: 08/24/2015 21:47    ANTIBIOTICS: Levofloxacin  DISCUSSION: Jvonte Berrian is an unfortunate 49 y.o. male with bipolar disorder and  lupus anticoagulant disorder on intermittent rivaroxaban due to his homeless/unemployed status who presents with worsening cough symptoms. CTA Chest showed no new PEs and a small nodule in the right lung base of uncertain significance. In light of his sputum production and mild hypoxemia requiring 2L/min [place], acute bronchitis on top of chronic pulmonary thromboembolic disease from is likely. His hypotension is puzzling as his labs are not consistent with florid septic shock nor hypovolemic shock. I'm reassured that his BPs have normalized after 2 liters of fluid. We  will admit to the MICU for further care.  ASSESSMENT / PLAN:  PULMONARY A: Acute bronchitis, history of PEs and suspicion of chronic thromboembolic disease P:   On 2 L/min [place]. Will wean as tolerated  Duonebs PRN Antibiotics for bronchitis  CARDIOVASCULAR A:  Tachycardic and hypotensive - Hypotension is fluid responsive. Considerations include septic, hypovolemic etiologies P:  As he is normotensive currently, will repeat lactate to make sure he is clearing and continue care for septic shock If worsening status, will obtain echocardiogram  RENAL A:   No issues currently P:   Montior  HEMATOLOGY A: Lupus anticoagulant disorder P: Continue Rivaroxaban  GASTROINTESTINAL A:   Mild nausea P:   Will monitor  INFECTIOUS A:   Acute bronchitis P:   Levofloxacin for 5 days planned Follow up sputum cultures   NEUROLOGIC / PSYCH A:   Bipolar disorder - Not taking psychiatric medications due to lack of finances. No suidiality currently.  P:   Will continue to hold prescribed medications in acute setting   Will need social worker consultation for homeless issues and inability to afford medications  30 minutes of critical care time was spent on this encounter  Pulmonary and [place] Pager: [phone]  08/24/2015, 11:02 PM

## 2015-08-24 NOTE — ED Notes (Signed)
Dr. Eulis Foster notified of patient's low BP.  IV saline started per order.

## 2015-08-25 DIAGNOSIS — J189 Pneumonia, unspecified organism: Secondary | ICD-10-CM | POA: Insufficient documentation

## 2015-08-25 DIAGNOSIS — J208 Acute bronchitis due to other specified organisms: Secondary | ICD-10-CM

## 2015-08-25 DIAGNOSIS — A419 Sepsis, unspecified organism: Secondary | ICD-10-CM

## 2015-08-25 LAB — LACTIC ACID, PLASMA
Lactic Acid, Venous: 3.1 mmol/L (ref 0.5–2.0)
Lactic Acid, Venous: 1.4 mmol/L (ref 0.5–2.0)

## 2015-08-25 LAB — BASIC METABOLIC PANEL
ANION GAP: 9 (ref 5–15)
BUN: 6 mg/dL (ref 6–20)
CHLORIDE: 108 mmol/L (ref 101–111)
CO2: 22 mmol/L (ref 22–32)
Calcium: 7.7 mg/dL — ABNORMAL LOW (ref 8.9–10.3)
Creatinine, Ser: 0.6 mg/dL — ABNORMAL LOW (ref 0.61–1.24)
GFR calc Af Amer: >60 mL/min (ref >60)
Glucose, Bld: 100 mg/dL — ABNORMAL HIGH (ref 65–99)
POTASSIUM: 4.3 mmol/L (ref 3.5–5.1)
SODIUM: 139 mmol/L (ref 135–145)

## 2015-08-25 LAB — CBC
HEMATOCRIT: 37.5 % — AB (ref 39.0–52.0)
HEMOGLOBIN: 12.2 g/dL — AB (ref 13.0–17.0)
MCH: 33.3 pg (ref 26.0–34.0)
MCHC: 32.5 g/dL (ref 30.0–36.0)
MCV: 102.5 fL — AB (ref 78.0–100.0)
Platelets: 124 10*3/uL — ABNORMAL LOW (ref 150–400)
RBC: 3.66 MIL/uL — AB (ref 4.22–5.81)
RDW: 14.6 % (ref 11.5–15.5)
WBC: 6 10*3/uL (ref 4.0–10.5)

## 2015-08-25 LAB — MRSA PCR SCREENING: MRSA by PCR: NEGATIVE

## 2015-08-25 LAB — BLOOD GAS, VENOUS
Acid-base deficit: 4.8 mmol/L — ABNORMAL HIGH (ref 0.0–2.0)
BICARBONATE: 19.5 meq/L — AB (ref 20.0–24.0)
FIO2: 0.21
O2 Saturation: 74.6 %
PCO2 VEN: 35.4 mmHg — AB (ref 45.0–50.0)
PH VEN: 7.36 — AB (ref 7.250–7.300)
Patient temperature: 37
TCO2: 17.8 mmol/L (ref 0–100)
pO2, Ven: 43.1 mmHg (ref 30.0–45.0)

## 2015-08-25 MED ORDER — LORAZEPAM 1 MG PO TABS: 0.0000 mg | ORAL_TABLET | Freq: Two times a day (BID) | ORAL | Status: DC

## 2015-08-25 MED ORDER — CITALOPRAM HYDROBROMIDE 20 MG PO TABS
20.0000 mg | ORAL_TABLET | Freq: Every day | ORAL | Status: DC
  Administered 2015-08-25 – 2015-08-26 (×2): 20 mg via ORAL
  Filled 2015-08-25 (×2): qty 1

## 2015-08-25 MED ORDER — THIAMINE HCL 100 MG/ML IJ SOLN
100.0000 mg | Freq: Every day | INTRAMUSCULAR | Status: DC
  Filled 2015-08-25: qty 1

## 2015-08-25 MED ORDER — FOLIC ACID 1 MG PO TABS
1.0000 mg | ORAL_TABLET | Freq: Every day | ORAL | Status: DC
  Administered 2015-08-25 – 2015-08-26 (×2): 1 mg via ORAL
  Filled 2015-08-25 (×2): qty 1

## 2015-08-25 MED ORDER — VITAMIN B-1 100 MG PO TABS
100.0000 mg | ORAL_TABLET | Freq: Every day | ORAL | Status: DC
  Administered 2015-08-25 – 2015-08-26 (×2): 100 mg via ORAL
  Filled 2015-08-25 (×2): qty 1

## 2015-08-25 MED ORDER — ADULT MULTIVITAMIN W/MINERALS CH
1.0000 | ORAL_TABLET | Freq: Every day | ORAL | Status: DC
  Administered 2015-08-25 – 2015-08-26 (×2): 1 via ORAL
  Filled 2015-08-25 (×2): qty 1

## 2015-08-25 MED ORDER — LORAZEPAM 1 MG PO TABS
0.0000 mg | ORAL_TABLET | Freq: Four times a day (QID) | ORAL | Status: DC
  Administered 2015-08-25 – 2015-08-26 (×3): 1 mg via ORAL
  Filled 2015-08-25 (×3): qty 1

## 2015-08-25 MED ORDER — LORAZEPAM 2 MG/ML IJ SOLN: 1.0000 mg | Freq: Four times a day (QID) | INTRAMUSCULAR | Status: DC | PRN

## 2015-08-25 MED ORDER — LORAZEPAM 1 MG PO TABS
1.0000 mg | ORAL_TABLET | Freq: Four times a day (QID) | ORAL | Status: DC | PRN
  Administered 2015-08-25: 1 mg via ORAL
  Filled 2015-08-25: qty 1

## 2015-08-25 NOTE — H&P (Signed)
PULMONARY / CRITICAL CARE MEDICINE History and Physical  Name: [name] MRN: [id number] DOB: 1965/09/22    ADMISSION DATE:  08/24/2015 CONSULTATION DATE:  08/24/15  REFERRING MD:  Dr Christ Kick, MD  CHIEF COMPLAINT:  "I have a bad cough"  HISTORY OF PRESENT ILLNESS:   [name] is a 49 y.o. 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 For the past week, he says that he had a bad cough with thick, green sputum production, intermittent hot/cold spells and fatigue. Over the past day, his cough caused the sides of his chest to hurt and prompted visit to the ED. Here, a CTA Chest revealed no large colidations nor signs of acute PE. He was given 2 liters of normal saline for hypotension and a lactate of 5.21. He felt better after the fluids and currently is concerned about not being able to afford any of his medications - particularly his rivaroxaban which he is taking one table a week (instead of daily) after losing Medicaid earlier this year. He has nausea in the mornings with occasional clear emesis. No other findings on ROS. He continues to smoke 2 packs per day of cigarrettes but denies any drug use. [place]   Overnight: no distress, no pressors, BP wnl, no shaking  VITAL SIGNS: BP 107/43 mmHg  Pulse 115  Temp(Src) 98.2 F (36.8 C) (Oral)  Resp 17  Ht 6\' 2"  (1.88 m)  Wt 97.7 kg (215 lb 6.2 oz)  BMI 27.64 kg/m2  SpO2 92%   PHYSICAL EXAMINATION: General:  Alert, oriented, no acute distress, comfortable on examination bed Neuro:  No focal deficits HEENT:  Dry mucous membranes, jvd wnl Cardiovascular:  Mild tachy 105 S2 S2 rr Lungs:  CTAB Abdomen:  Soft, nontender, nondistended Musculoskeletal:  Missing half a digit in left hand, tenderness to palpation over chest sides bilaterally from coughing Skin:   Hyperpigmented areas over both legs  LABS:  BMET  Recent Labs Lab 08/24/15 1656 08/25/15 0346  NA 132* 139  K 3.8 4.3  CL 97* 108  CO2 23 22  BUN 10 6  CREATININE 0.71 0.60*  GLUCOSE 92 100*    Electrolytes  Recent Labs Lab 08/24/15 1656 08/25/15 0346  CALCIUM 8.6* 7.7*    CBC  Recent Labs Lab 08/24/15 1656 08/25/15 0346  WBC 8.6 6.0  HGB 14.5 12.2*  HCT 43.3 37.5*  PLT 134* 124*    Coag's No results for input(s): APTT, INR in the last 168 hours.  Sepsis Markers  Recent Labs Lab 08/24/15 1957 08/24/15 2310 08/25/15 0346  LATICACIDVEN 5.21* 3.66* 1.4    ABG No results for input(s): PHART, PCO2ART, PO2ART in the last 168 hours.  Liver Enzymes No results for input(s): AST, ALT, ALKPHOS, BILITOT, ALBUMIN in the last 168 hours.  Cardiac Enzymes No results for input(s): TROPONINI, PROBNP in the last 168 hours.  Glucose No results for input(s): GLUCAP in the last 168 hours.  Imaging Dg Chest 2 View  08/24/2015  CLINICAL DATA:  Cough, congestion, shortness of breath, fever for 5 days, prior pulmonary embolism EXAM: CHEST  2 VIEW COMPARISON:  05/15/2015 Correlation:  CTA chest 07/05/2015 FINDINGS: Normal heart size, mediastinal contours, and pulmonary vascularity. Vague densities in the RIGHT mid lung correspond to healing fractures of the anterior RIGHT third and fourth ribs as noted on prior  CT. Calcified granuloma posteriorly LEFT mid lung. No infiltrate, pleural effusion or pneumothorax. Bones unremarkable. IMPRESSION: No acute abnormalities. Electronically Signed   By: Lavonia Dana M.D.   On: 08/24/2015 17:50   Ct Angio Chest Pe W/cm &/or Wo Cm  08/24/2015  CLINICAL DATA:  Cough for 5 days.  Abnormal chest radiograph. EXAM: CT ANGIOGRAPHY CHEST WITH CONTRAST TECHNIQUE: Multidetector CT imaging of the chest was performed using the standard protocol during bolus administration of intravenous contrast. Multiplanar CT image reconstructions and MIPs were  obtained to evaluate the vascular anatomy. CONTRAST:  157mL OMNIPAQUE IOHEXOL 350 MG/ML SOLN COMPARISON:  07/05/2015. FINDINGS: Mediastinum: The heart size appears normal. The trachea appears patent and is midline. Normal appearance of the esophagus. No mediastinal or hilar adenopathy. No axillary or supraclavicular adenopathy. Despite 2 attempts at re- injection and scanning there is suboptimal pulmonary arterial opacification. Within this limitation there is no evidence for saddle embolus or central obstructing embolus. No lobar or segmental pulmonary artery filling defects identified to suggest a clinically significant acute pulmonary embolus. Lungs/Pleura: No pleural fluid. Calcified granuloma noted in the superior segment of the left lower lobe. Scar like density in the lateral right lower lobe is again noted. There is a developing nodular density within this area which measures 9 mm, nonspecific. Upper Abdomen: Hepatic steatosis noted. No focal liver abnormality noted. The visualized portions of the spleen appear normal Musculoskeletal: No aggressive lytic or sclerotic bone lesions. Mild spondylosis noted within the thoracic spine. Right anterior fifth and sixth rib fractures appears chronic. Review of the MIP images confirms the above findings. IMPRESSION: 1. Sub optimal pulmonary arterial opacification. Within this limitation, there is no evidence for acute pulmonary embolus. 2. There is a peripheral nodular opacity in 8 previous area of scarring within the periphery of the right lung base. This may be post inflammatory or infectious. A followup examination in 3 months is recommended to ensure resolution. Electronically Signed   By: Kerby Moors M.D.   On: 08/24/2015 21:47    ANTIBIOTICS: Levofloxacin  DISCUSSION: [name] is an unfortunate 49 y.o. male with bipolar disorder and lupus anticoagulant disorder on intermittent rivaroxaban due to his homeless/unemployed status who presents with  worsening cough symptoms. CTA Chest showed no new PEs and a small nodule in the right lung base of uncertain significance. In light of his sputum production and mild hypoxemia requiring 2L/min [place], acute bronchitis on top of chronic pulmonary thromboembolic disease from is likely. His hypotension is puzzling as his labs are not consistent with florid septic shock nor hypovolemic shock. I'm reassured that his BPs have normalized after 2 liters of fluid. We will admit to the MICU for further care.  ASSESSMENT / PLAN:  PULMONARY A: Acute bronchitis, history of PEs and suspicion of chronic thromboembolic disease - neg CT P:   On 2 L/min [place]. Will wean as tolerated , to goal RA done Duonebs PRN Antibiotics for bronchitis, see ID Ambulatory pulse ox Will need follow up pulmonary and CT chest with in next 1 month  CARDIOVASCULAR A:  Tachycardic and hypotensive hypovolemia, resolved P:  LA cleared BP wnl Allow pios balance Dc tele Trop neg  RENAL A:   Resolved lactic acidosis P:   Montior chem in am   HEMATOLOGY A: Lupus anticoagulant disorder P: Continue Rivaroxaban  GASTROINTESTINAL A:   Mild nausea P:   Will monitor as advance diet  INFECTIOUS A:   Acute bronchitis P:   Levofloxacin for 5 days planned Follow  up sputum cultures  NEUROLOGIC / PSYCH A:   Bipolar disorder - Not taking psychiatric medications due to lack of finances. HIGH risk etoh WD P:   Add home celexa Add ativan, ciwa   Will need social worker consultation for homeless issues and inability to afford medications To [facility],  To med floor Needs pulm and CT in 1 month as outpt  Lavon Paganini. Titus Mould, MD, [place] Pgr: [place] [facility]

## 2015-08-25 NOTE — Clinical Social Work Note (Signed)
Clinical Social Work Assessment  Patient Details  Name: [name] MRN: [id number] Date of Birth: Dec 14, 1965  Date of referral:  08/25/15               Reason for consult:  Housing Concerns/Homelessness                Permission sought to share information with:  Other Permission granted to share information::  Yes, Verbal Permission Granted  Name::        Agency::  [facility]  Relationship::     Contact Information:     Housing/Transportation Living arrangements for the past 2 months:  Homeless Source of Information:  Patient Patient Interpreter Needed:  None Criminal Activity/Legal Involvement Pertinent to Current Situation/Hospitalization:  No - Comment as needed Significant Relationships:  Friend Lives with:  Self Do you feel safe going back to the place where you live?  Yes Need for family participation in patient care:  No (Coment)  Care giving concerns:  Pt is homeless and unable to afford medications- no one to help assist with any physical or financial needs   Social Worker assessment / plan:  CSW spoke with pt concerning current period of homelessness/ ability for pt to care for basic needs at this time.  Employment status:    Insurance information:  Self Pay (Medicaid Pending) PT Recommendations:  Not assessed at this time Information / Referral to community resources:  Shelter, Other (Comment Required) ([facility])  Patient/Family's Response to care:  Pt was agreeable to speaking with CSW regarding current homelessness and options to help with pt needs.  CSW explained [facility] and [facility] assessment- pt is agreeable to participate with survey.  Pt reports that he has been homeless for 11 years ever since he and his wife split up.  Pt has lived outside for a large part of that 11 year because he is unwilling to stay in a shelter.  Per MD report pt has issues with alcoholism and has been unable to afford his  medication due to lack of insurance.  Pt states that he has to return to the hospital every time he runs out of medication.   Pt plans on returning to living outside when [facility] from the hospital but does state he follows up with [facility] sporadically.   Patient/Family's Understanding of and Emotional Response to Diagnosis, Current Treatment, and Prognosis:  Pt seems to have good understanding of importance to comply with medications but does not seem to be able to balance resources in order to obtain medications.  Emotional Assessment Appearance:  Appears older than stated age Attitude/Demeanor/Rapport:    Affect (typically observed):  Appropriate Orientation:  Oriented to Self, Oriented to Place, Oriented to  Time, Oriented to Situation Alcohol / Substance use:  Alcohol Use Psych involvement (Current and /or in the community):  No (Comment)  Discharge Needs  Concerns to be addressed:  Homelessness, Financial / Insurance Concerns, Medication Concerns Readmission within the last 30 days:  No Current discharge risk:  Homeless Barriers to Discharge:  Homeless with medical needs, Continued Medical Work up   [employer], [place], LCSW 08/25/2015, 1:50 PM

## 2015-08-26 ENCOUNTER — Encounter (HOSPITAL_COMMUNITY): Payer: Self-pay | Admitting: Pulmonary Disease

## 2015-08-26 LAB — PHOSPHORUS: Phosphorus: 3.2 mg/dL (ref 2.5–4.6)

## 2015-08-26 LAB — BASIC METABOLIC PANEL
ANION GAP: 8 (ref 5–15)
BUN: 7 mg/dL (ref 6–20)
CHLORIDE: 106 mmol/L (ref 101–111)
CO2: 26 mmol/L (ref 22–32)
Calcium: 8.7 mg/dL — ABNORMAL LOW (ref 8.9–10.3)
Creatinine, Ser: 0.63 mg/dL (ref 0.61–1.24)
GFR calc non Af Amer: >60 mL/min (ref >60)
Glucose, Bld: 88 mg/dL (ref 65–99)
Potassium: 3.8 mmol/L (ref 3.5–5.1)
Sodium: 140 mmol/L (ref 135–145)

## 2015-08-26 LAB — URINE CULTURE
Culture: NO GROWTH
Special Requests: NORMAL

## 2015-08-26 LAB — MAGNESIUM: MAGNESIUM: 1.8 mg/dL (ref 1.7–2.4)

## 2015-08-26 MED ORDER — RIVAROXABAN 20 MG PO TABS: 20.0000 mg | ORAL_TABLET | Freq: Every day | ORAL | Status: DC

## 2015-08-26 MED ORDER — LEVOFLOXACIN 500 MG PO TABS: 500.0000 mg | ORAL_TABLET | Freq: Every day | ORAL | Status: DC

## 2015-08-26 NOTE — Care Management Note (Signed)
Case Management Note  Patient Details  Name: Meryle Pacifico MRN: [id number] Date of Birth: [birth year]  Subjective/Objective:               pna versus dvt     Action/Plan:  Chart reviewed for needs and loc   Expected Discharge Date:  08/28/15               Expected Discharge Plan:  Homeless Shelter  In-House Referral:  Clinical Social Work  Discharge planning Services  CM Consult  Post Acute Care Choice:  NA Choice offered to:  NA  DME Arranged:    DME Agency:     HH Arranged:    [place] Agency:     Status of Service:  In process, will continue to follow  Medicare Important Message Given:    Date Medicare IM Given:    Medicare IM give by:    Date Additional Medicare IM Given:    Additional Medicare Important Message give by:     If discussed at [place] of Stay Meetings, dates discussed:    Additional Comments:  Leeroy Cha, RN 08/26/2015, 12:28 PM

## 2015-08-26 NOTE — Discharge Summary (Signed)
Physician Discharge Summary  Patient ID: [name] MRN: [id number] DOB/AGE: 1965/10/09 49 y.o.  Admit date: 08/24/2015 Discharge date: 08/26/2015    Discharge Diagnoses:  Acute Bronchitis  History of PE (suspicion of chronic thromboembolic disease) Tachycardia  Hypotension  Lupus Anticoagulant Disorder Bipolar Disorder                                                                        DISCHARGE PLAN BY DIAGNOSIS     Acute Bronchitis  History of PE (suspicion of chronic thromboembolic disease) Nodular Opacity   Discharge Plan: - Complete Levaquin, D3/5.  Rx given for 2 tablets.  Cost assessed for patient at outpatient pharmacy (9$ for two tablets) - Follow up with [facility] (patient will have to call for an appointment, attempted to schedule but no available appointments for hospital follow-up at this time.  Staff instructed for him to call back on a daily basis to determine).   - Recommend repeat CT Chest to evaluate nodular density in 2-3 months to ensure clearance.    Tachycardia  Hypotension Lactic Acidosis - resolved   Discharge Plan: - Resolved, no acute follow up at this time.    Lupus Anticoagulant Disorder  Discharge Plan: - Assessed outpatient cost of Xarelto - 400 $ per month.  Previously used Nurse, mental health via [employer] and he has used free month of Xarelto program card.  At this point, his best option is to follow up with the [facility] to be set up for assistance program.   - Rx for Xarelto 20 mg QD given with 3 refills   Bipolar Disorder ETOH Abuse   Discharge Plan: - Resume citalopram, campral at discharge  - ETOH cessation                     [name] is a 49 y.o. y/o male, smoker (2 packs per day), with a PMH of bipolar disorder, CAD, hepatitis B, degenerative joint disease, PE/DVT in the setting of lupus anticoagulant disorder (on Xarelto, only using once per week) and alcohol  abuse who presented to [facility] on 12/17 with a reported history of 5 days of cough with green mucus production, shortness of breath and pain in his ribs related to coughing.  The patient was evaluated in the ER with a CTA of the chest which was negative for pulmonary embolism and consolidation. It was notable for a nodular opacity in the right base that was recommended for outpatient follow-up. He was initially noted to be hypotensive with a lactate of 5.21. The patient reported concerns on admission regarding financial difficulties with obtaining medications. He previously had Medicaid earlier in the year however this was discontinued by the state.  He reported alcohol ingestion on admission with nausea and vomiting in the mornings.  Initial labs notable for NA 132, K3.8, chloride 97, serum creatinine 0.71, WBC 8.6, hemoglobin 14.5, and platelets 134. The patient was volume resuscitated with normal saline with improvement in blood pressure.  Reassessment of lactic acid cleared to 1.4.  Subsequent chest x-rays were negative for acute infiltrates.  Of note, the patient did have healing right rib fractures.  He remained hemodynamically stable  and was transferred to the medical floor. He was evaluated by case management and social worker for outpatient assistance with medications and homelessness. Patient was provided options for homeless shelters within the community. The patient was medically cleared for discharge 12/19 with plans as above.              SIGNIFICANT DIAGNOSTIC STUDIES 12/17  CTA Chest >> sub optimal pulmonary artery opacification, given limits, no evidence of PE.  Peripheral nodular opacity in R lung base  MICRO DATA  BCx2 12/17 >>  UC 12/17 >> negative    ANTIBIOTICS Levaquin 12/17 >>  CONSULTS Social Work  Case Management   Discharge Exam: General: well developed male in NAD Neuro: AAOx4, speech clear, MAD HEENT:  MM pink/moist, no jvd  CV: s1s2 rrr, no  m/r/g PULM: even/non-labored, lungs bilaterally clear, on RA GI: NTND, bsx4 active, tolerating PO's Extremities: warm/dry, no edema   Filed Vitals:   08/25/15 2359 08/26/15 0410 08/26/15 0646 08/26/15 1217  BP: 157/90  152/86 167/96  Pulse: 88  79 83  Temp: 98.1 F (36.7 C)  98.1 F (36.7 C) 98.4 F (36.9 C)  TempSrc: Oral  Oral Oral  Resp: _0 Height:      Weight:  208 lb 5.4 oz (94.5 kg)    SpO2: 96%  98% 94%     Discharge Labs  BMET  Recent Labs Lab 08/24/15 1656 08/25/15 0346 08/26/15 0510  NA 132* 139 140  K 3.8 4.3 3.8  CL 97* 108 106  CO2 _1 GLUCOSE 92 100* 88  BUN _2 CREATININE 0.71 0.60* 0.63  CALCIUM 8.6* 7.7* 8.7*  MG  --   --  1.8  PHOS  --   --  3.2    CBC  Recent Labs Lab 08/24/15 1656 08/25/15 0346  HGB 14.5 12.2*  HCT 43.3 37.5*  WBC 8.6 6.0  PLT 134* 124*         Follow-up Information    Schedule an appointment as soon as possible for a visit with [facility].   Why:  Please call daily to see if they have an available appointment open up for December.  If not, call in January for new clinic visit.     Contact information:   [address] [place] [address] [phone]         Medication List    TAKE these medications        acamprosate 333 MG tablet  Commonly known as:  CAMPRAL  Take 2 tablets (666 mg total) by mouth 3 (three) times daily with meals. For alcoholism     citalopram 20 MG tablet  Commonly known as:  CELEXA  Take 1 tablet (20 mg total) by mouth daily. For depression     levofloxacin 500 MG tablet  Commonly known as:  LEVAQUIN  Take 1 tablet (500 mg total) by mouth at bedtime.  Start taking on:  08/27/2015     rivaroxaban 20 MG Tabs tablet  Commonly known as:  XARELTO  Take 1 tablet (20 mg total) by mouth daily.          Disposition: Homeless shelter.  Information provided to patient by Case Management / Social Work prior  to discharge.   Discharged Condition: [name] has met maximum benefit of inpatient care and is medically stable and cleared for discharge.  Patient is pending follow up as above.  Time spent on disposition:  Greater than 35 minutes.   Signed: Noe Gens, NP-C [facility] Pgr: [phone] Office: [phone]

## 2015-08-26 NOTE — Progress Notes (Signed)
Bus pass given to [employer] for patient. No further services requested at this time. CSW to sign off.   Lucius Conn, [place] Social Worker [place] [phone]

## 2015-08-30 LAB — CULTURE, BLOOD (ROUTINE X 2)
CULTURE: NO GROWTH
Culture: NO GROWTH

## 2016-02-08 ENCOUNTER — Inpatient Hospital Stay (HOSPITAL_COMMUNITY)
Admission: EM | Admit: 2016-02-08 | Discharge: 2016-02-12 | DRG: 299 | Disposition: A | Discharge status: Other Acute Inpatient Hospital | Payer: Self-pay | Attending: Internal Medicine | Admitting: Internal Medicine

## 2016-02-08 ENCOUNTER — Encounter (HOSPITAL_COMMUNITY): Payer: Self-pay | Admitting: Emergency Medicine

## 2016-02-08 ENCOUNTER — Emergency Department (HOSPITAL_COMMUNITY): Payer: Self-pay

## 2016-02-08 DIAGNOSIS — I82409 Acute embolism and thrombosis of unspecified deep veins of unspecified lower extremity: Secondary | ICD-10-CM | POA: Diagnosis present

## 2016-02-08 DIAGNOSIS — Z86711 Personal history of pulmonary embolism: Secondary | ICD-10-CM

## 2016-02-08 DIAGNOSIS — Z95828 Presence of other vascular implants and grafts: Secondary | ICD-10-CM

## 2016-02-08 DIAGNOSIS — I2510 Atherosclerotic heart disease of native coronary artery without angina pectoris: Secondary | ICD-10-CM | POA: Diagnosis present

## 2016-02-08 DIAGNOSIS — F10220 Alcohol dependence with intoxication, uncomplicated: Secondary | ICD-10-CM | POA: Diagnosis present

## 2016-02-08 DIAGNOSIS — F10129 Alcohol abuse with intoxication, unspecified: Secondary | ICD-10-CM | POA: Diagnosis present

## 2016-02-08 DIAGNOSIS — I82402 Acute embolism and thrombosis of unspecified deep veins of left lower extremity: Secondary | ICD-10-CM

## 2016-02-08 DIAGNOSIS — G4700 Insomnia, unspecified: Secondary | ICD-10-CM | POA: Diagnosis present

## 2016-02-08 DIAGNOSIS — M479 Spondylosis, unspecified: Secondary | ICD-10-CM | POA: Diagnosis present

## 2016-02-08 DIAGNOSIS — L03116 Cellulitis of left lower limb: Secondary | ICD-10-CM | POA: Diagnosis present

## 2016-02-08 DIAGNOSIS — Z599 Problem related to housing and economic circumstances, unspecified: Secondary | ICD-10-CM

## 2016-02-08 DIAGNOSIS — I82532 Chronic embolism and thrombosis of left popliteal vein: Principal | ICD-10-CM | POA: Diagnosis present

## 2016-02-08 DIAGNOSIS — F17210 Nicotine dependence, cigarettes, uncomplicated: Secondary | ICD-10-CM | POA: Diagnosis present

## 2016-02-08 DIAGNOSIS — Z9119 Patient's noncompliance with other medical treatment and regimen: Secondary | ICD-10-CM

## 2016-02-08 DIAGNOSIS — J9601 Acute respiratory failure with hypoxia: Secondary | ICD-10-CM | POA: Diagnosis present

## 2016-02-08 DIAGNOSIS — F332 Major depressive disorder, recurrent severe without psychotic features: Secondary | ICD-10-CM | POA: Diagnosis present

## 2016-02-08 DIAGNOSIS — J690 Pneumonitis due to inhalation of food and vomit: Secondary | ICD-10-CM

## 2016-02-08 DIAGNOSIS — F10920 Alcohol use, unspecified with intoxication, uncomplicated: Secondary | ICD-10-CM

## 2016-02-08 DIAGNOSIS — J449 Chronic obstructive pulmonary disease, unspecified: Secondary | ICD-10-CM | POA: Diagnosis present

## 2016-02-08 DIAGNOSIS — L0390 Cellulitis, unspecified: Secondary | ICD-10-CM

## 2016-02-08 DIAGNOSIS — O871 Deep phlebothrombosis in the puerperium: Secondary | ICD-10-CM

## 2016-02-08 DIAGNOSIS — B1910 Unspecified viral hepatitis B without hepatic coma: Secondary | ICD-10-CM | POA: Diagnosis present

## 2016-02-08 DIAGNOSIS — G40909 Epilepsy, unspecified, not intractable, without status epilepticus: Secondary | ICD-10-CM | POA: Diagnosis present

## 2016-02-08 DIAGNOSIS — R45851 Suicidal ideations: Secondary | ICD-10-CM

## 2016-02-08 DIAGNOSIS — Z79899 Other long term (current) drug therapy: Secondary | ICD-10-CM

## 2016-02-08 DIAGNOSIS — D6862 Lupus anticoagulant syndrome: Secondary | ICD-10-CM | POA: Diagnosis present

## 2016-02-08 DIAGNOSIS — R0902 Hypoxemia: Secondary | ICD-10-CM

## 2016-02-08 DIAGNOSIS — I82512 Chronic embolism and thrombosis of left femoral vein: Secondary | ICD-10-CM | POA: Diagnosis present

## 2016-02-08 DIAGNOSIS — R0602 Shortness of breath: Secondary | ICD-10-CM

## 2016-02-08 DIAGNOSIS — I10 Essential (primary) hypertension: Secondary | ICD-10-CM | POA: Diagnosis present

## 2016-02-08 DIAGNOSIS — Z7901 Long term (current) use of anticoagulants: Secondary | ICD-10-CM

## 2016-02-08 DIAGNOSIS — F1020 Alcohol dependence, uncomplicated: Secondary | ICD-10-CM | POA: Diagnosis present

## 2016-02-08 DIAGNOSIS — Y908 Blood alcohol level of 240 mg/100 ml or more: Secondary | ICD-10-CM | POA: Diagnosis present

## 2016-02-08 LAB — CBG MONITORING, ED: GLUCOSE-CAPILLARY: 103 mg/dL — AB (ref 65–99)

## 2016-02-08 LAB — CBC WITH DIFFERENTIAL/PLATELET
BASOS ABS: 0 10*3/uL (ref 0.0–0.1)
BASOS PCT: 1 %
EOS ABS: 0.1 10*3/uL (ref 0.0–0.7)
EOS PCT: 1 %
HCT: 43.2 % (ref 39.0–52.0)
Hemoglobin: 14.3 g/dL (ref 13.0–17.0)
Lymphocytes Relative: 30 %
Lymphs Abs: 1.6 10*3/uL (ref 0.7–4.0)
MCH: 33.3 pg (ref 26.0–34.0)
MCHC: 33.1 g/dL (ref 30.0–36.0)
MCV: 100.5 fL — ABNORMAL HIGH (ref 78.0–100.0)
MONO ABS: 0.6 10*3/uL (ref 0.1–1.0)
Monocytes Relative: 10 %
NEUTROS ABS: 3.2 10*3/uL (ref 1.7–7.7)
Neutrophils Relative %: 58 %
Platelets: 140 10*3/uL — ABNORMAL LOW (ref 150–400)
RBC: 4.3 MIL/uL (ref 4.22–5.81)
RDW: 14 % (ref 11.5–15.5)
WBC: 5.5 10*3/uL (ref 4.0–10.5)

## 2016-02-08 LAB — PROTIME-INR
INR: 0.96 (ref 0.00–1.49)
PROTHROMBIN TIME: 12.6 s (ref 11.6–15.2)

## 2016-02-08 LAB — BLOOD GAS, ARTERIAL
ACID-BASE DEFICIT: 2.4 mmol/L — AB (ref 0.0–2.0)
BICARBONATE: 22.4 meq/L (ref 20.0–24.0)
DRAWN BY: 232811
O2 CONTENT: 2.5 L/min
O2 Saturation: 94 %
PATIENT TEMPERATURE: 97.5
PH ART: 7.365 (ref 7.350–7.450)
TCO2: 20.4 mmol/L (ref 0–100)
pCO2 arterial: 39.8 mmHg (ref 35.0–45.0)
pO2, Arterial: 74.9 mmHg — ABNORMAL LOW (ref 80.0–100.0)

## 2016-02-08 LAB — COMPREHENSIVE METABOLIC PANEL
ALBUMIN: 3.7 g/dL (ref 3.5–5.0)
ALT: 47 U/L (ref 17–63)
ANION GAP: 9 (ref 5–15)
AST: 73 U/L — ABNORMAL HIGH (ref 15–41)
Alkaline Phosphatase: 79 U/L (ref 38–126)
BUN: <5 mg/dL — ABNORMAL LOW (ref 6–20)
CALCIUM: 8.6 mg/dL — AB (ref 8.9–10.3)
CO2: 24 mmol/L (ref 22–32)
CREATININE: 0.79 mg/dL (ref 0.61–1.24)
Chloride: 109 mmol/L (ref 101–111)
GFR calc Af Amer: >60 mL/min (ref >60)
GFR calc non Af Amer: >60 mL/min (ref >60)
GLUCOSE: 98 mg/dL (ref 65–99)
POTASSIUM: 4 mmol/L (ref 3.5–5.1)
SODIUM: 142 mmol/L (ref 135–145)
TOTAL PROTEIN: 7.7 g/dL (ref 6.5–8.1)
Total Bilirubin: 0.2 mg/dL — ABNORMAL LOW (ref 0.3–1.2)

## 2016-02-08 LAB — I-STAT CG4 LACTIC ACID, ED: Lactic Acid, Venous: 2.7 mmol/L (ref 0.5–2.0)

## 2016-02-08 MED ORDER — DEXTROSE 5 % IV SOLN
500.0000 mg | Freq: Once | INTRAVENOUS | Status: AC
  Administered 2016-02-08: 500 mg via INTRAVENOUS
  Filled 2016-02-08: qty 500

## 2016-02-08 MED ORDER — SODIUM CHLORIDE 0.9 % IV BOLUS (SEPSIS)
1000.0000 mL | Freq: Once | INTRAVENOUS | Status: AC
  Administered 2016-02-08: 1000 mL via INTRAVENOUS

## 2016-02-08 MED ORDER — IOPAMIDOL (ISOVUE-370) INJECTION 76%
100.0000 mL | Freq: Once | INTRAVENOUS | Status: AC | PRN
  Administered 2016-02-08: 100 mL via INTRAVENOUS

## 2016-02-08 MED ORDER — SODIUM CHLORIDE 0.9 % IV BOLUS (SEPSIS)
1000.0000 mL | Freq: Once | INTRAVENOUS | Status: AC
  Administered 2016-02-09: 1000 mL via INTRAVENOUS

## 2016-02-08 MED ORDER — ALBUTEROL SULFATE (2.5 MG/3ML) 0.083% IN NEBU
5.0000 mg | INHALATION_SOLUTION | Freq: Once | RESPIRATORY_TRACT | Status: AC
  Administered 2016-02-08: 5 mg via RESPIRATORY_TRACT
  Filled 2016-02-08: qty 6

## 2016-02-08 MED ORDER — ACETAMINOPHEN 325 MG PO TABS
650.0000 mg | ORAL_TABLET | Freq: Once | ORAL | Status: AC
  Administered 2016-02-08: 650 mg via ORAL
  Filled 2016-02-08: qty 2

## 2016-02-08 MED ORDER — DEXTROSE 5 % IV SOLN
1.0000 g | Freq: Once | INTRAVENOUS | Status: AC
  Administered 2016-02-08: 1 g via INTRAVENOUS
  Filled 2016-02-08: qty 10

## 2016-02-08 NOTE — ED Provider Notes (Signed)
CSN: [id number]     Arrival date & time 02/08/16  1958 History  By signing my name below, I, Stephania Fragmin, attest that this documentation has been prepared under the direction and in the presence of Delos Haring, PA-C. Electronically Signed: Stephania Fragmin, ED Scribe. 02/08/2016. 10:54 PM.    Chief Complaint  Patient presents with  . Leg Pain  . Back Pain  . Shortness of Breath   The history is provided by the patient and the EMS personnel. No language interpreter was used.    HPI Comments: [name] is a 50 y.o. male with a past medical history of coronary artery disease, degenerative disc disease, pulmonary embolisms, lupus, EtOH, DVT, hep B, and depression who presents to the Emergency Department complaining of SOB and gradual-onset, constant, worsening, left lower leg pain, swelling, and redness that began "a couple months ago." Per EMS, his O2 was initially 85% on room air and then when placed on 3L it improved to 98%. Patient reports he has an IVC filter in his neck due to his history of DVT and PE. Patient states he has been off his medications, including Xarelto, for several months, as he cannot afford them. Patient states he had taken Coumadin for a while before taking Xarelto. He states he had stopped taking them, as he was told he could not drink EtOH and take Coumadin at the same time. Patient states he smokes about 2-3 cigarettes per day and drinks EtOH daily. He states he last consumed EtOH yesterday. Patient reports he is unsure where the bruising on his abdomen came from.  Patient also complains of SI with a plan, as he tearfully states he is tired of being in pain. He reports he had considered slitting his wrists vertically.    @ 9:42 pm - Patient has not yet been placed in room.  @ 10:20 pm- Patient not yet in room. Asked nurse about it and he has been changed from room 10 to room 19.     Past Medical History  Diagnosis Date  . Coronary artery disease   . Degenerative joint  disease of spine   . Pulmonary embolism ([place]) 06/2010  . Degenerative joint disease   . Lupus anticoagulant disorder ([place]) 02/02/2012  . ETOH abuse   . DVT (deep venous thrombosis) ([place])   . Hepatitis B   . Depression   . Collagen vascular disease [facility])    Past Surgical History  Procedure Laterality Date  . Left elbow surgery    . Tonsillectomy     Family History  Problem Relation Age of Onset  . Cancer Mother     Ovarian   Social History  Substance Use Topics  . Smoking status: Current Every Day Smoker -- 2.00 packs/day for 30 years    Types: Cigarettes  . Smokeless tobacco: Never Used  . Alcohol Use: 14.4 oz/week    24 Cans of beer per week     Comment: cas of beer and fifth of vodka daily    Review of Systems  Respiratory: Positive for shortness of breath.   Musculoskeletal: Positive for myalgias (left lower leg pain) and back pain.  Skin: Positive for color change (redness on left lower leg; bruising to abdomen).  Psychiatric/Behavioral: Positive for suicidal ideas.  All other systems reviewed and are negative.     Allergies  Review of patient's allergies indicates no known allergies.  Home Medications   Prior to Admission medications   Medication Sig Start Date End Date  Taking? Authorizing Provider  acamprosate (CAMPRAL) 333 MG tablet Take 2 tablets (666 mg total) by mouth 3 (three) times daily with meals. For alcoholism Patient not taking: Reported on 05/15/2015 02/15/15   Shuvon B Rankin, NP  citalopram (CELEXA) 20 MG tablet Take 1 tablet (20 mg total) by mouth daily. For depression Patient not taking: Reported on 07/05/2015 02/15/15   Shuvon B Rankin, NP  hydrOXYzine (ATARAX/VISTARIL) 25 MG tablet Take 25 mg by mouth 3 (three) times daily as needed.    Historical Provider, MD  levofloxacin (LEVAQUIN) 500 MG tablet Take 1 tablet (500 mg total) by mouth at bedtime. 08/27/15   Donita Brooks, NP  rivaroxaban (XARELTO) 20 MG TABS tablet Take 1 tablet (20 mg total)  by mouth daily. Patient not taking: Reported on 02/08/2016 08/26/15   Donita Brooks, NP  traZODone (DESYREL) 100 MG tablet Take 200 mg by mouth at bedtime. Reported on 02/08/2016    Historical Provider, MD   BP 88/58 mmHg  Pulse 98  Temp(Src) 97.5 F (36.4 C) (Oral)  Resp 15  SpO2 93% Physical Exam  Constitutional: He is oriented to person, place, and time. He appears well-developed and well-nourished. He appears distressed.  HENT:  Head: Normocephalic and atraumatic.  Eyes: Conjunctivae and EOM are normal.  Neck: Neck supple. No tracheal deviation present.  Cardiovascular: Normal rate.   Pulmonary/Chest: Effort normal. No accessory muscle usage. No respiratory distress. He has decreased breath sounds. He has no wheezes. He has no rhonchi.  On 3L nasal cannula  Abdominal: Soft. He exhibits no distension. There is no tenderness. There is no rebound.  Musculoskeletal: Normal range of motion.  Neurological: He is alert and oriented to person, place, and time. GCS eye subscore is 4. GCS verbal subscore is 5. GCS motor subscore is 6.  Skin: Skin is warm and dry. He is not diaphoretic.  Swelling and erythema to left lower extremity.   Psychiatric: He has a normal mood and affect. His behavior is normal.  Nursing note and vitals reviewed.   ED Course  Procedures (including critical care time)  DIAGNOSTIC STUDIES: Oxygen Saturation is 94% on 2 L, normal by my interpretation.    COORDINATION OF CARE: 10:46 PM - Discussed treatment plan with pt at bedside. Pt verbalized understanding and agreed to plan.   Labs Review Labs Reviewed  CBC WITH DIFFERENTIAL/PLATELET - Abnormal; Notable for the following:    MCV 100.5 (*)    Platelets 140 (*)    All other components within normal limits  COMPREHENSIVE METABOLIC PANEL - Abnormal; Notable for the following:    BUN <5 (*)    Calcium 8.6 (*)    AST 73 (*)    Total Bilirubin 0.2 (*)    All other components within normal limits  ETHANOL -  Abnormal; Notable for the following:    Alcohol, Ethyl (B) 310 (*)    All other components within normal limits  BLOOD GAS, ARTERIAL - Abnormal; Notable for the following:    pO2, Arterial 74.9 (*)    Acid-base deficit 2.4 (*)    All other components within normal limits  CBG MONITORING, ED - Abnormal; Notable for the following:    Glucose-Capillary 103 (*)    All other components within normal limits  I-STAT CG4 LACTIC ACID, ED - Abnormal; Notable for the following:    Lactic Acid, Venous 2.70 (*)    All other components within normal limits  CULTURE, BLOOD (ROUTINE X 2)  CULTURE, BLOOD (  ROUTINE X 2)  URINE CULTURE  PROTIME-INR  URINE RAPID DRUG SCREEN, HOSP PERFORMED  URINALYSIS, ROUTINE W REFLEX MICROSCOPIC (NOT AT [facility])  LACTIC ACID, PLASMA  LACTIC ACID, PLASMA  CRYOGLOBULIN  I-STAT CG4 LACTIC ACID, ED    Imaging Review Dg Chest 2 View  02/08/2016  CLINICAL DATA:  Shortness of Breath EXAM: CHEST  2 VIEW COMPARISON:  Chest radiograph and chest CT August 24, 2015 FINDINGS: There is patchy infiltrate in the left base. There is a calcified granuloma in the superior segment left lower lobe. The lungs elsewhere are clear. Heart size and pulmonary vascularity are normal. No adenopathy. No bone lesions. IMPRESSION: Patchy infiltrate left base, most likely pneumonia. Calcified granuloma superior segment left lower lobe. Lungs elsewhere clear. Stable cardiac silhouette. Followup PA and lateral chest radiographs recommended in 3-4 weeks following trial of antibiotic therapy to ensure resolution and exclude underlying malignancy. Electronically Signed   By: Lowella Grip III M.D.   On: 02/08/2016 21:11   I have personally reviewed and evaluated these images and lab results as part of my medical decision-making.   EKG Interpretation   Date/Time:  Sunday February 09 2016 00:40:49 EDT Ventricular Rate:  108 PR Interval:  171 QRS Duration: 97 QT Interval:  363 QTC Calculation: 487 R  Axis:   80 Text Interpretation:  Sinus tachycardia Borderline prolonged QT interval  No significant change since last tracing Confirmed by Christy Gentles  MD, DONALD  [phone]) on 02/09/2016 2:03:21 AM      MDM   Final diagnoses:  Cellulitis, unspecified cellulitis site, unspecified extremity site, unspecified laterality  Suicidal ideation  Hypoxic  Aspiration pneumonia, unspecified aspiration pneumonia type, unspecified laterality, unspecified part of lung ([place])  Alcohol intoxication, uncomplicated ([place])   CRITICAL CARE Performed by: Linus Mako Total critical care time: 40 minutes Critical care time was exclusive of separately billable procedures and treating other patients. Critical care was necessary to treat or prevent imminent or life-threatening deterioration. Critical care was time spent personally by me on the following activities: development of treatment plan with patient and/or surrogate as well as nursing, discussions with consultants, evaluation of patient's response to treatment, examination of patient, obtaining history from patient or surrogate, ordering and performing treatments and interventions, ordering and review of laboratory studies, ordering and review of radiographic studies, pulse oximetry and re-evaluation of patient's condition.    NO PE on CT angio chest, elevated lactic acid and patient is intoxicated. abx given for possible pneumonia and cellulitis. Patient to be admitted to medicine, Dr. Alcario Drought, inpatient, [facility] admits, [facility].     Delos Haring, PA-C 02/09/16 [place], MD 02/09/16 [phone]

## 2016-02-08 NOTE — ED Notes (Signed)
Pt from home with complaints of SOB and left leg pain. Pt has a history of DVTs Pt states he has been off his blood thinners x 3 months because he can not afford them. Pt has diminished lung sounds and had an initial O2 sat of 85% on RA for EMS who placed him on 3L.

## 2016-02-09 ENCOUNTER — Observation Stay (HOSPITAL_COMMUNITY): Payer: Self-pay

## 2016-02-09 DIAGNOSIS — I82502 Chronic embolism and thrombosis of unspecified deep veins of left lower extremity: Secondary | ICD-10-CM

## 2016-02-09 DIAGNOSIS — R45851 Suicidal ideations: Secondary | ICD-10-CM

## 2016-02-09 DIAGNOSIS — J9601 Acute respiratory failure with hypoxia: Secondary | ICD-10-CM | POA: Diagnosis present

## 2016-02-09 DIAGNOSIS — F10129 Alcohol abuse with intoxication, unspecified: Secondary | ICD-10-CM

## 2016-02-09 DIAGNOSIS — F332 Major depressive disorder, recurrent severe without psychotic features: Secondary | ICD-10-CM

## 2016-02-09 DIAGNOSIS — F1020 Alcohol dependence, uncomplicated: Secondary | ICD-10-CM

## 2016-02-09 DIAGNOSIS — D6862 Lupus anticoagulant syndrome: Secondary | ICD-10-CM

## 2016-02-09 DIAGNOSIS — I82402 Acute embolism and thrombosis of unspecified deep veins of left lower extremity: Secondary | ICD-10-CM

## 2016-02-09 LAB — URINALYSIS, ROUTINE W REFLEX MICROSCOPIC
Bilirubin Urine: NEGATIVE
Glucose, UA: NEGATIVE mg/dL
Hgb urine dipstick: NEGATIVE
Ketones, ur: NEGATIVE mg/dL
Leukocytes, UA: NEGATIVE
NITRITE: NEGATIVE
PH: 5 (ref 5.0–8.0)
Protein, ur: NEGATIVE mg/dL

## 2016-02-09 LAB — RAPID URINE DRUG SCREEN, HOSP PERFORMED
Amphetamines: NOT DETECTED
Barbiturates: NOT DETECTED
Benzodiazepines: NOT DETECTED
COCAINE: NOT DETECTED
OPIATES: NOT DETECTED
TETRAHYDROCANNABINOL: NOT DETECTED

## 2016-02-09 LAB — COMPREHENSIVE METABOLIC PANEL
ALBUMIN: 3.2 g/dL — AB (ref 3.5–5.0)
ALT: 46 U/L (ref 17–63)
ANION GAP: 8 (ref 5–15)
AST: 78 U/L — ABNORMAL HIGH (ref 15–41)
Alkaline Phosphatase: 73 U/L (ref 38–126)
BILIRUBIN TOTAL: 0.5 mg/dL (ref 0.3–1.2)
BUN: <5 mg/dL — ABNORMAL LOW (ref 6–20)
CALCIUM: 7.8 mg/dL — AB (ref 8.9–10.3)
CO2: 23 mmol/L (ref 22–32)
Chloride: 107 mmol/L (ref 101–111)
Creatinine, Ser: 0.86 mg/dL (ref 0.61–1.24)
Glucose, Bld: 119 mg/dL — ABNORMAL HIGH (ref 65–99)
POTASSIUM: 3.9 mmol/L (ref 3.5–5.1)
Sodium: 138 mmol/L (ref 135–145)
TOTAL PROTEIN: 6.8 g/dL (ref 6.5–8.1)

## 2016-02-09 LAB — ETHANOL: ALCOHOL ETHYL (B): 310 mg/dL — AB (ref <5)

## 2016-02-09 LAB — HEPARIN LEVEL (UNFRACTIONATED)
HEPARIN UNFRACTIONATED: 0.32 [IU]/mL (ref 0.30–0.70)
Heparin Unfractionated: 0.41 IU/mL (ref 0.30–0.70)

## 2016-02-09 LAB — LACTIC ACID, PLASMA: Lactic Acid, Venous: 2.1 mmol/L (ref 0.5–2.0)

## 2016-02-09 MED ORDER — HYDROXYZINE HCL 25 MG PO TABS: 25.0000 mg | ORAL_TABLET | Freq: Three times a day (TID) | ORAL | Status: DC | PRN

## 2016-02-09 MED ORDER — WARFARIN SODIUM 5 MG PO TABS
10.0000 mg | ORAL_TABLET | Freq: Once | ORAL | Status: AC
  Administered 2016-02-09: 10 mg via ORAL
  Filled 2016-02-09: qty 2

## 2016-02-09 MED ORDER — ADULT MULTIVITAMIN W/MINERALS CH
1.0000 | ORAL_TABLET | Freq: Every day | ORAL | Status: DC
  Administered 2016-02-09 – 2016-02-12 (×4): 1 via ORAL
  Filled 2016-02-09 (×4): qty 1

## 2016-02-09 MED ORDER — ALBUTEROL SULFATE (2.5 MG/3ML) 0.083% IN NEBU: 5.0000 mg | INHALATION_SOLUTION | Freq: Once | RESPIRATORY_TRACT | Status: DC

## 2016-02-09 MED ORDER — TRAZODONE HCL 100 MG PO TABS: 200.0000 mg | ORAL_TABLET | Freq: Every day | ORAL | Status: DC

## 2016-02-09 MED ORDER — SODIUM CHLORIDE 0.9 % IV BOLUS (SEPSIS)
250.0000 mL | Freq: Once | INTRAVENOUS | Status: AC
  Administered 2016-02-09: 250 mL via INTRAVENOUS

## 2016-02-09 MED ORDER — IBUPROFEN 200 MG PO TABS
600.0000 mg | ORAL_TABLET | Freq: Four times a day (QID) | ORAL | Status: DC | PRN
  Administered 2016-02-09 – 2016-02-12 (×6): 600 mg via ORAL
  Filled 2016-02-09 (×7): qty 3

## 2016-02-09 MED ORDER — ADULT MULTIVITAMIN W/MINERALS CH: 1.0000 | ORAL_TABLET | Freq: Every day | ORAL | Status: DC

## 2016-02-09 MED ORDER — TRAZODONE HCL 100 MG PO TABS
200.0000 mg | ORAL_TABLET | Freq: Every day | ORAL | Status: DC
Start: 2016-02-09 — End: 2016-02-09
  Filled 2016-02-09: qty 2

## 2016-02-09 MED ORDER — LORAZEPAM 1 MG PO TABS
1.0000 mg | ORAL_TABLET | Freq: Two times a day (BID) | ORAL | Status: AC
  Administered 2016-02-11 – 2016-02-12 (×2): 1 mg via ORAL
  Filled 2016-02-09 (×2): qty 1

## 2016-02-09 MED ORDER — FOLIC ACID 1 MG PO TABS
1.0000 mg | ORAL_TABLET | Freq: Every day | ORAL | Status: DC
  Administered 2016-02-09 – 2016-02-12 (×4): 1 mg via ORAL
  Filled 2016-02-09 (×4): qty 1

## 2016-02-09 MED ORDER — TRAMADOL HCL 50 MG PO TABS
50.0000 mg | ORAL_TABLET | Freq: Four times a day (QID) | ORAL | Status: DC | PRN
  Administered 2016-02-09: 50 mg via ORAL
  Filled 2016-02-09: qty 1

## 2016-02-09 MED ORDER — LOPERAMIDE HCL 2 MG PO CAPS: 2.0000 mg | ORAL_CAPSULE | ORAL | Status: AC | PRN

## 2016-02-09 MED ORDER — LORAZEPAM 1 MG PO TABS
1.0000 mg | ORAL_TABLET | Freq: Four times a day (QID) | ORAL | Status: AC
  Administered 2016-02-09 – 2016-02-10 (×4): 1 mg via ORAL
  Filled 2016-02-09: qty 1
  Filled 2016-02-09: qty 2
  Filled 2016-02-09 (×2): qty 1

## 2016-02-09 MED ORDER — RIVAROXABAN 20 MG PO TABS
20.0000 mg | ORAL_TABLET | Freq: Every day | ORAL | Status: DC
Start: 2016-02-09 — End: 2016-02-09

## 2016-02-09 MED ORDER — LORAZEPAM 2 MG/ML IJ SOLN
1.0000 mg | Freq: Four times a day (QID) | INTRAMUSCULAR | Status: AC | PRN
  Administered 2016-02-09 (×2): 1 mg via INTRAVENOUS
  Filled 2016-02-09 (×2): qty 1

## 2016-02-09 MED ORDER — THIAMINE HCL 100 MG/ML IJ SOLN: 100.0000 mg | Freq: Every day | INTRAMUSCULAR | Status: DC

## 2016-02-09 MED ORDER — LORAZEPAM 1 MG PO TABS
1.0000 mg | ORAL_TABLET | Freq: Three times a day (TID) | ORAL | Status: AC
  Administered 2016-02-10 – 2016-02-11 (×3): 1 mg via ORAL
  Filled 2016-02-09 (×3): qty 1

## 2016-02-09 MED ORDER — IPRATROPIUM BROMIDE 0.02 % IN SOLN
0.5000 mg | Freq: Once | RESPIRATORY_TRACT | Status: AC
  Administered 2016-02-09: 0.5 mg via RESPIRATORY_TRACT
  Filled 2016-02-09: qty 2.5

## 2016-02-09 MED ORDER — IPRATROPIUM-ALBUTEROL 0.5-2.5 (3) MG/3ML IN SOLN
3.0000 mL | Freq: Three times a day (TID) | RESPIRATORY_TRACT | Status: DC
  Administered 2016-02-09 – 2016-02-12 (×11): 3 mL via RESPIRATORY_TRACT
  Filled 2016-02-09 (×11): qty 3

## 2016-02-09 MED ORDER — HEPARIN BOLUS VIA INFUSION
5000.0000 [IU] | Freq: Once | INTRAVENOUS | Status: AC
  Administered 2016-02-09: 5000 [IU] via INTRAVENOUS
  Filled 2016-02-09: qty 5000

## 2016-02-09 MED ORDER — HEPARIN (PORCINE) IN NACL 100-0.45 UNIT/ML-% IJ SOLN
1600.0000 [IU]/h | INTRAMUSCULAR | Status: DC
  Administered 2016-02-09: 1600 [IU]/h via INTRAVENOUS
  Filled 2016-02-09 (×3): qty 250

## 2016-02-09 MED ORDER — LORAZEPAM 1 MG PO TABS
1.0000 mg | ORAL_TABLET | Freq: Four times a day (QID) | ORAL | Status: AC | PRN
  Administered 2016-02-09 (×2): 1 mg via ORAL
  Filled 2016-02-09: qty 1

## 2016-02-09 MED ORDER — HYDROXYZINE HCL 25 MG PO TABS: 25.0000 mg | ORAL_TABLET | Freq: Four times a day (QID) | ORAL | Status: AC | PRN

## 2016-02-09 MED ORDER — THIAMINE HCL 100 MG/ML IJ SOLN: 100.0000 mg | Freq: Once | INTRAMUSCULAR | Status: DC

## 2016-02-09 MED ORDER — VITAMIN B-1 100 MG PO TABS
100.0000 mg | ORAL_TABLET | Freq: Every day | ORAL | Status: DC
  Administered 2016-02-09 – 2016-02-12 (×4): 100 mg via ORAL
  Filled 2016-02-09 (×4): qty 1

## 2016-02-09 MED ORDER — LORAZEPAM 1 MG PO TABS: 1.0000 mg | ORAL_TABLET | Freq: Every day | ORAL | Status: DC

## 2016-02-09 MED ORDER — HEPARIN (PORCINE) IN NACL 100-0.45 UNIT/ML-% IJ SOLN
1700.0000 [IU]/h | INTRAMUSCULAR | Status: DC
  Administered 2016-02-09 – 2016-02-11 (×3): 1700 [IU]/h via INTRAVENOUS
  Filled 2016-02-09 (×4): qty 250

## 2016-02-09 MED ORDER — CHLORDIAZEPOXIDE HCL 10 MG PO CAPS
10.0000 mg | ORAL_CAPSULE | Freq: Three times a day (TID) | ORAL | Status: DC
  Administered 2016-02-09: 10 mg via ORAL
  Filled 2016-02-09: qty 1

## 2016-02-09 MED ORDER — TRAZODONE HCL 50 MG PO TABS
200.0000 mg | ORAL_TABLET | Freq: Every day | ORAL | Status: DC
  Administered 2016-02-09 – 2016-02-11 (×3): 200 mg via ORAL
  Filled 2016-02-09 (×3): qty 4

## 2016-02-09 MED ORDER — LORAZEPAM 1 MG PO TABS: 1.0000 mg | ORAL_TABLET | Freq: Four times a day (QID) | ORAL | Status: DC | PRN

## 2016-02-09 MED ORDER — ONDANSETRON 4 MG PO TBDP: 4.0000 mg | ORAL_TABLET | Freq: Four times a day (QID) | ORAL | Status: AC | PRN

## 2016-02-09 MED ORDER — WARFARIN - PHARMACIST DOSING INPATIENT: Freq: Every day | Status: DC

## 2016-02-09 MED ORDER — ALBUTEROL SULFATE (2.5 MG/3ML) 0.083% IN NEBU
5.0000 mg | INHALATION_SOLUTION | Freq: Once | RESPIRATORY_TRACT | Status: AC
  Administered 2016-02-09: 5 mg via RESPIRATORY_TRACT
  Filled 2016-02-09: qty 6

## 2016-02-09 MED ORDER — VITAMIN B-1 100 MG PO TABS: 100.0000 mg | ORAL_TABLET | Freq: Every day | ORAL | Status: DC

## 2016-02-09 MED ORDER — IPRATROPIUM BROMIDE 0.02 % IN SOLN: 0.5000 mg | Freq: Once | RESPIRATORY_TRACT | Status: DC

## 2016-02-09 MED ORDER — LORAZEPAM 2 MG/ML IJ SOLN: 0.5000 mg | Freq: Once | INTRAMUSCULAR | Status: DC

## 2016-02-09 NOTE — Progress Notes (Signed)
CRITICAL VALUE ALERT  Critical value received:  Lactic acid- 2.1   Date of notification:  02/09/16  Time of notification:  0525  Critical value read back:Yes.    Nurse who received alert:  Sheffield Slider  MD notified (1st page):  Tom Callahan-NP   Time of first page:  6am  MD notified (2nd page):  Time of second page:  Responding MD:  Massie Maroon   Time MD responded:  [phone]

## 2016-02-09 NOTE — Progress Notes (Signed)
ANTICOAGULATION CONSULT NOTE - f/u Consult  Pharmacy Consult for warfarin and heparin Indication: DVT  No Known Allergies  Patient Measurements: Height: 6' (182.9 cm) Weight: 215 lb 12.8 oz (97.886 kg) IBW/kg (Calculated) : 77.6 Heparin Dosing Weight:   Vital Signs: Temp: 98.4 F (36.9 C) (06/04 1818) Temp Source: Oral (06/04 1818) BP: 154/90 mmHg (06/04 2000) Pulse Rate: 106 (06/04 2000)  Labs:  Recent Labs  02/08/16 2200 02/09/16 1443 02/09/16 2104  HGB 14.3  --   --   HCT 43.2  --   --   PLT 140*  --   --   LABPROT 12.6  --   --   INR 0.96  --   --   HEPARINUNFRC  --  0.41 0.32  CREATININE 0.79 0.86  --     Estimated Creatinine Clearance: 124.6 mL/min (by C-G formula based on Cr of 0.86).   Medical History: Past Medical History  Diagnosis Date  . Coronary artery disease   . Degenerative joint disease of spine   . Pulmonary embolism ([place]) 06/2010  . Degenerative joint disease   . Lupus anticoagulant disorder ([place]) 02/02/2012  . ETOH abuse   . DVT (deep venous thrombosis) ([place])   . Hepatitis B   . Depression   . Collagen vascular disease ([facility])     Medications:  Scheduled:  . folic acid  1 mg Oral Daily  . ipratropium-albuterol  3 mL Nebulization TID  . LORazepam  1 mg Oral QID   Followed by  . [START ON 02/10/2016] LORazepam  1 mg Oral TID   Followed by  . [START ON 02/11/2016] LORazepam  1 mg Oral BID   Followed by  . [START ON 02/13/2016] LORazepam  1 mg Oral Daily  . multivitamin with minerals  1 tablet Oral Daily  . thiamine  100 mg Oral Daily  . traZODone  200 mg Oral QHS  . Warfarin - Pharmacist Dosing Inpatient   Does not apply q1800    Assessment: Pharmacy is consulted to dose warfarin and heparin for 50 yo male diagnosed with DVT.Pt also has a history of lupus anticoagulant.  Pt has a Left chronic Leg DVT which is positive for swelling. Also has IVC filter. Pt was originally placed on Xarelto but was non compliant due to financial issues. Per  Med Rec pt has not had medication recently and this is supported by labs upon admission.    Pt is awaiting venous doppler to confirm DVT suspicion. If confirmed, may be a candidate for thrombolysis.   02/09/2016  INR on admission is 0.96, Hgb 14.3, plt low at 140  Pt has not had Xarelto dose since admission  No bleeding issues reported  SCr normal  1st HL=0.41  2104 HL=0.32, no infusion or bleeding problems per RN (at low end of goal)  Goal of Therapy:  INR 2-3  Heparin level of 0.3 to 0.7 Monitor platelets by anticoagulation protocol: Yes   Plan:  Will increase heparin drip to 1700 units/hr (17 ml/hr) to ensure stays in therapeutic range  Daily CBC, heparin level, PT/INR    Dorrene German 02/09/2016 10:18 PM

## 2016-02-09 NOTE — ED Notes (Signed)
Pt has a brass knuckle/ pocket knife locked up with security. (Red/White/Blue winged shaped). Please notify patient of this prior to discharge. He may pick up  Then. If he forgets, security with keep for 30 days then get rid of it.

## 2016-02-09 NOTE — Progress Notes (Signed)
PHARMACIST - PHYSICIAN COMMUNICATION CONCERNING:  IV heparin  52 yoM with hx lupus with IVCF, non-compliant on xarelto due to financial issues presents with new DVT, started on IV heparin and warfarin.  Dopplers confirm chronic DVT in left common femoral, femoral, and popliteal veins.  Noted possible plans for thrombolysis.   Please see note written earlier today by Royetta Asal, PharmD for further details.   Currently on IV heparin infusion 1600 units/hr.  First heparin level is therapeutic @ 0.41.  No issues with infusion or bleeding complications per RN.     RECOMMENDATION: Continue IV heparin at 1600 units/hr = 16 ml/hr.   Check confirmation heparin level in 6 hours.   F/u renal function, CBC, signs/symptoms bleeding. F/u need for thrombolysis / tx to [facility].  Warfarin already dosed for tonight - PT/INR in AM.    Ralene Bathe, PharmD, BCPS 02/09/2016, 3:37 PM  Pager: [phone]

## 2016-02-09 NOTE — ED Provider Notes (Signed)
Patient seen/examined in the Emergency Department in conjunction with Midlevel Provider Carlota Raspberry Patient reports cough and SOB Exam : awake/alert, he is disheveled, he is intoxicated.  Plan: will need admission.  CXR showed possible pneumonia and also cellulitis    Ripley Fraise, MD 02/09/16 [phone]

## 2016-02-09 NOTE — H&P (Addendum)
History and Physical    [name] [id number] DOB: July 15, 1966 DOA: 02/08/2016   PCP: No primary care provider on file. Chief Complaint:  Chief Complaint  Patient presents with  . Leg Pain  . Back Pain  . Shortness of Breath    HPI: [name] is a 50 y.o. male with medical history significant of CAD, PE, DVT, tobacco and EtOH abuse, HBV, depression.  Patient presents to the ED with c/o SOB ongoing for the past month.  Also has gradual-onset, worsening, LLE pain, swelling, redness that began "a couple of months ago".  Per EMS o2 initially 85% on room air, improved with [place].  He has an IVC in place due to h/o DVT.  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  He is not compliant with his xeralto due to financial issues he says.  ED Course: EtOH > 300, patient states his is also suicidal, initially concern for hypotension so he was given an IVF bolus; however, when a proper sized BP cuff was fitted to the patient he wasn't hypotensive.  Initially concern for PNA on CXR; however, CTA PE protocol was unremarkable.  Admitting for obs given new oxygen requirement of unclear etiology.  Review of Systems: As per HPI otherwise 10 point review of systems negative.    Past Medical History  Diagnosis Date  . Coronary artery disease   . Degenerative joint disease of spine   . Pulmonary embolism ([place]) 06/2010  . Degenerative joint disease   . Lupus anticoagulant disorder ([place]) 02/02/2012  . ETOH abuse   . DVT (deep venous thrombosis) ([place])   . Hepatitis B   . Depression   . Collagen vascular disease [facility])     Past Surgical History  Procedure Laterality Date  . Left elbow surgery    . Tonsillectomy       reports that he has been smoking Cigarettes.  He has a 60 pack-year smoking history. He has never used smokeless tobacco. He reports that he drinks about 14.4 oz of alcohol per week. He reports that he does not use illicit drugs.  No Known Allergies  Family History  Problem Relation Age of Onset    . Cancer Mother     Ovarian     Prior to Admission medications   Medication Sig Start Date End Date Taking? Authorizing Provider  acamprosate (CAMPRAL) 333 MG tablet Take 2 tablets (666 mg total) by mouth 3 (three) times daily with meals. For alcoholism Patient not taking: Reported on 05/15/2015 02/15/15   Shuvon B Rankin, NP  citalopram (CELEXA) 20 MG tablet Take 1 tablet (20 mg total) by mouth daily. For depression Patient not taking: Reported on 07/05/2015 02/15/15   Shuvon B Rankin, NP  hydrOXYzine (ATARAX/VISTARIL) 25 MG tablet Take 25 mg by mouth 3 (three) times daily as needed.    Historical Provider, MD  levofloxacin (LEVAQUIN) 500 MG tablet Take 1 tablet (500 mg total) by mouth at bedtime. 08/27/15   Donita Brooks, NP  rivaroxaban (XARELTO) 20 MG TABS tablet Take 1 tablet (20 mg total) by mouth daily. Patient not taking: Reported on 02/08/2016 08/26/15   Donita Brooks, NP  traZODone (DESYREL) 100 MG tablet Take 200 mg by mouth at bedtime. Reported on 02/08/2016    Historical Provider, MD    Physical Exam: Filed Vitals:   02/09/16 0119 02/09/16 0120 02/09/16 0148 02/09/16 0206  BP:  94/59 76/48 100/68  Pulse:  103  95  Temp:  TempSrc:      Resp:    16  SpO2: 90% 88%  91%      Constitutional: NAD, calm, comfortable Eyes: PERRL, lids and conjunctivae normal ENMT: Mucous membranes are moist. Posterior pharynx clear of any exudate or lesions.Normal dentition.  Neck: normal, supple, no masses, no thyromegaly Respiratory: clear to auscultation bilaterally, no wheezing, no crackles. Normal respiratory effort. No accessory muscle use.  Cardiovascular: Regular rate and rhythm, no murmurs / rubs / gallops. No extremity edema. 2+ pedal pulses. No carotid bruits.  Abdomen: no tenderness, no masses palpated. No hepatosplenomegaly. Bowel sounds positive.  Musculoskeletal: no clubbing / cyanosis. No joint deformity upper and lower extremities. Good ROM, no contractures. Normal muscle  tone.  Skin: no rashes, lesions, ulcers. No induration, LLE is swollen compared to right with numerous spider veins and changes of venous stasis. Neurologic: CN 2-12 grossly intact. Sensation intact, DTR normal. Strength 5/5 in all 4.  Psychiatric: Normal judgment and insight. Alert and oriented x 3. Normal mood.    Labs on Admission: I have personally reviewed following labs and imaging studies  CBC:  Recent Labs Lab 02/08/16 2200  WBC 5.5  NEUTROABS 3.2  HGB 14.3  HCT 43.2  MCV 100.5*  PLT [id number]*   Basic Metabolic Panel:  Recent Labs Lab 02/08/16 2200  NA 142  K 4.0  CL 109  CO2 24  GLUCOSE 98  BUN <5*  CREATININE 0.79  CALCIUM 8.6*   GFR: CrCl cannot be calculated (Unknown ideal weight.). Liver Function Tests:  Recent Labs Lab 02/08/16 2200  AST 73*  ALT 47  ALKPHOS 79  BILITOT 0.2*  PROT 7.7  ALBUMIN 3.7   No results for input(s): LIPASE, AMYLASE in the last 168 hours. No results for input(s): AMMONIA in the last 168 hours. Coagulation Profile:  Recent Labs Lab 02/08/16 2200  INR 0.96   Cardiac Enzymes: No results for input(s): CKTOTAL, CKMB, CKMBINDEX, TROPONINI in the last 168 hours. BNP (last 3 results) No results for input(s): PROBNP in the last 8760 hours. HbA1C: No results for input(s): HGBA1C in the last 72 hours. CBG:  Recent Labs Lab 02/08/16 2149  GLUCAP 103*   Lipid Profile: No results for input(s): CHOL, HDL, LDLCALC, TRIG, CHOLHDL, LDLDIRECT in the last 72 hours. Thyroid Function Tests: No results for input(s): TSH, T4TOTAL, FREET4, T3FREE, THYROIDAB in the last 72 hours. Anemia Panel: No results for input(s): VITAMINB12, FOLATE, FERRITIN, TIBC, IRON, RETICCTPCT in the last 72 hours. Urine analysis:    Component Value Date/Time   COLORURINE YELLOW 08/24/2015 2232   APPEARANCEUR CLEAR 08/24/2015 2232   LABSPEC 1.006 08/24/2015 2232   PHURINE 5.5 08/24/2015 2232   GLUCOSEU NEGATIVE 08/24/2015 2232   HGBUR NEGATIVE  08/24/2015 2232   BILIRUBINUR NEGATIVE 08/24/2015 2232   KETONESUR NEGATIVE 08/24/2015 2232   PROTEINUR NEGATIVE 08/24/2015 2232   UROBILINOGEN 0.2 04/25/2015 0245   NITRITE NEGATIVE 08/24/2015 2232   LEUKOCYTESUR NEGATIVE 08/24/2015 2232   Sepsis Labs: @LABRCNTIP (procalcitonin:4,lacticidven:4) )No results found for this or any previous visit (from the past 240 hour(s)).   Radiological Exams on Admission: Dg Chest 2 View  02/08/2016  CLINICAL DATA:  Shortness of Breath EXAM: CHEST  2 VIEW COMPARISON:  Chest radiograph and chest CT August 24, 2015 FINDINGS: There is patchy infiltrate in the left base. There is a calcified granuloma in the superior segment left lower lobe. The lungs elsewhere are clear. Heart size and pulmonary vascularity are normal. No adenopathy. No bone lesions. IMPRESSION: Patchy  infiltrate left base, most likely pneumonia. Calcified granuloma superior segment left lower lobe. Lungs elsewhere clear. Stable cardiac silhouette. Followup PA and lateral chest radiographs recommended in 3-4 weeks following trial of antibiotic therapy to ensure resolution and exclude underlying malignancy. Electronically Signed   By: Lowella Grip III M.D.   On: 02/08/2016 21:11   Ct Angio Chest Pe W/cm &/or Wo Cm  02/08/2016  CLINICAL DATA:  Acute onset of shortness of breath and left leg pain. Personal history of DVTs. Decreased O2 saturation. Initial encounter. EXAM: CT ANGIOGRAPHY CHEST WITH CONTRAST TECHNIQUE: Multidetector CT imaging of the chest was performed using the standard protocol during bolus administration of intravenous contrast. Multiplanar CT image reconstructions and MIPs were obtained to evaluate the vascular anatomy. The study was repeated due to limitations in the timing of the first contrast bolus. CONTRAST:  175 mL of Isovue 370 IV contrast COMPARISON:  Chest radiograph performed earlier today at 9:00 p.m. FINDINGS: There is no evidence of central pulmonary embolus.  Evaluation for pulmonary embolus is suboptimal due to limitations in the timing of the contrast bolus. Minimal right-sided atelectasis and scarring are noted. A large calcified granuloma is noted at the superior aspect of the left lower lobe. There is no evidence of significant focal consolidation, pleural effusion or pneumothorax. No masses are identified; no abnormal focal contrast enhancement is seen. The mediastinum is unremarkable appearance. No mediastinal lymphadenopathy is seen. No pericardial effusion is identified. The great vessels are grossly unremarkable in appearance. No axillary lymphadenopathy is seen. The thyroid gland is unremarkable in appearance. The visualized portions of the liver and spleen are unremarkable. The visualized portions of the pancreas, stomach, adrenal glands and kidneys are within normal limits. No acute osseous abnormalities are seen. Review of the MIP images confirms the above findings. IMPRESSION: 1. No evidence of central pulmonary embolus. Evaluation for pulmonary embolus is suboptimal due to limitations in the timing of the contrast bolus. 2. Minimal right-sided atelectasis and scarring noted. Electronically Signed   By: Garald Balding M.D.   On: 02/08/2016 23:54    EKG: Independently reviewed.  Assessment/Plan Principal Problem:   Acute respiratory failure with hypoxia (HCC) Active Problems:   Lupus anticoagulant disorder (HCC)   Deep vein thrombosis (HCC)   Suicidal ideation   Alcohol abuse with intoxication ([place])   Acute resp failure with hypoxia -  New O2 requirement of unclear cause or etiology  CTA negative, however there is poor contrast visualization of peripheral pulmonary arteries, he certainly could have a smaller peripheral PE.  (Will resume Xeralto)  Will put patient on neb treatments PRN but he isnt really wheezing, so im not clear that this or steroids will be of much benefit.  Strongly suspicious of an underlying, undiagnosed COPD type  pathology, especially given that he smokes 2 packs per day.  Will get patient admitted for observation  EtOH abuse with intoxication -  CIWA protocol  H/o DVT and lupus anticoagulant -  Restarting Xeralto  Checking BLE ultrasound for possible new DVT.  Suicidal ideation - call psych in AM   DVT prophylaxis: Xeralto Code Status: Full Family Communication: No family in room Consults called: None Admission status: Admit to obs   Johnattan Strassman, [facility] Pager [phone] from 7PM-7AM  If 7AM-7PM, please contact the day physician for the patient [email or web address] Password [email or web address]  02/09/2016, 3:07 AM

## 2016-02-09 NOTE — ED Notes (Signed)
MD at bedside. 

## 2016-02-09 NOTE — ED Notes (Signed)
Low manual BP taken, Pt easy to arouse. [place] is WNL. Continuing to receive bolus. Md aware.

## 2016-02-09 NOTE — Progress Notes (Signed)
ANTICOAGULATION CONSULT NOTE - Initial Consult  Pharmacy Consult for warfarin and heparin Indication: DVT  No Known Allergies  Patient Measurements: Height: 6' (182.9 cm) Weight: 215 lb 12.8 oz (97.886 kg) IBW/kg (Calculated) : 77.6 Heparin Dosing Weight:   Vital Signs: Temp: 97.6 F (36.4 C) (06/04 0541) Temp Source: Oral (06/04 0541) BP: 119/76 mmHg (06/04 0541) Pulse Rate: 90 (06/04 0541)  Labs:  Recent Labs  02/08/16 2200  HGB 14.3  HCT 43.2  PLT 140*  LABPROT 12.6  INR 0.96  CREATININE 0.79    Estimated Creatinine Clearance: 133.9 mL/min (by C-G formula based on Cr of 0.79).   Medical History: Past Medical History  Diagnosis Date  . Coronary artery disease   . Degenerative joint disease of spine   . Pulmonary embolism ([place]) 06/2010  . Degenerative joint disease   . Lupus anticoagulant disorder ([place]) 02/02/2012  . ETOH abuse   . DVT (deep venous thrombosis) ([place])   . Hepatitis B   . Depression   . Collagen vascular disease (HCC)     Medications:  Scheduled:  . chlordiazePOXIDE  10 mg Oral TID  . folic acid  1 mg Oral Daily  . heparin  5,000 Units Intravenous Once  . ipratropium-albuterol  3 mL Nebulization TID  . multivitamin with minerals  1 tablet Oral Daily  . thiamine  100 mg Oral Daily   Or  . thiamine  100 mg Intravenous Daily  . traZODone  200 mg Oral QHS  . warfarin  10 mg Oral ONCE-1800  . Warfarin - Pharmacist Dosing Inpatient   Does not apply q1800    Assessment: Pharmacy is consulted to dose warfarin and heparin for 50 yo male diagnosed with DVT.Pt also has a history of lupus anticoagulant.  Pt has a Left chronic Leg DVT which is positive for swelling. Also has IVC filter. Pt was originally placed on Xarelto but was non compliant due to financial issues. Per Med Rec pt has not had medication recently and this is supported by labs upon admission.    Pt is awaiting venous doppler to confirm DVT suspicion. If confirmed, may be a  candidate for thrombolysis.   02/09/2016  INR on admission is 0.96, Hgb 14.3, plt low at 140  Pt has not had Xarelto dose since admission  No bleeding issues reported  SCr normal  Goal of Therapy:  INR 2-3  Heparin level of 0.3 to 0.7 Monitor platelets by anticoagulation protocol: Yes   Plan:  Heparin 5000 unit bolus followed by 1600 units/hr Heparin level 6 hours after start  Warfarin 10 mg PO x1  Daily CBC, heparin level, PT/INR   Royetta Asal, PharmD, BCPS Pager [phone] 02/09/2016 8:40 AM

## 2016-02-09 NOTE — Progress Notes (Signed)
Aware of request for possible thrombolysis.  Patient is awaiting venous doppler to confirm suspicion of DVTs.  He is currently on a heparin drip.  If his dopplers are positive and radiologist reviews and feels he is an appropriate candidate for a thrombolysis, then the patient would require transfer to [facility] for further treatment.  Will follow chart for now and review doppler with radiologist once it is complete.  Lizandra Zakrzewski E 8:47 AM 02/09/2016
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
## 2016-02-09 NOTE — Progress Notes (Signed)
VASCULAR LAB PRELIMINARY  PRELIMINARY  PRELIMINARY  PRELIMINARY  Bilateral lower extremity venous duplex completed.    Preliminary report:  Right:  No evidence of DVT or superficial thrombosis. Baker's cyst noted in the popliteal fossa measuring 4.24 cm x 1.44 cm. 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 No evidence of superficial thrombosis or Baker's cyst  [name], RVS 02/09/2016, 11:52 AM

## 2016-02-09 NOTE — Progress Notes (Signed)
PROGRESS NOTE                                                                                                                                                                                                             Patient Demographics:    [name], is a 50 y.o. male, DOB - August 23, 1966, [id number]  Admit date - 02/08/2016   Admitting Physician Etta Quill, DO  Outpatient Primary MD for the patient is No primary care provider on file.  LOS -   Chief Complaint  Patient presents with  . Leg Pain  . Back Pain  . Shortness of Breath       Brief Narrative   [name] is a 50 y.o. male with medical history significant of CAD, PE, DVT, tobacco and EtOH abuse, HBV, depression. Patient presents to the ED with c/o SOB ongoing for the past month. Also has gradual-onset, worsening, LLE pain, swelling, redness that began "a couple of months ago". Per EMS o2 initially 85% on room air, improved with [place]. He has an IVC in place due to h/o DVT. 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 He is not compliant with his xeralto due to financial issues he says.  ED Course: EtOH > 300, patient states his is also suicidal, initially concern for hypotension so he was given an IVF bolus; however, when a proper sized BP cuff was fitted to the patient he wasn't hypotensive. Initially concern for PNA on CXR; however, CTA PE protocol was unremarkable. Admitting for obs given new oxygen requirement of unclear etiology.     Subjective:    [name] today has, No headache, No chest pain, No abdominal pain - No Nausea, No new weakness tingling or numbness, No Cough - SOB.    Assessment  & Plan :     1.Left Leg DVT Chronic - ++ swelling, was non complaint with Xaralto due to financial issues, has IVC filter, CTA negative, check Venous US, IR input ? Thrombectomy L Leg, for now Heparin and Coumadin - probably safest with Alcohol abuse.   2.  Alcohol abuse - counseled, CIWA & scheduled Librium.   3.Depression - Suicidal in the ER - Psych called, sitter on Trazadone.    Code Status :  Full  Family Communication  :  None  Disposition Plan  :  Stay inpatient  Consults  :  IR, Psych  Procedures  :    CTA chest - No PE  Venous US legs  DVT Prophylaxis  :  - Heparin -    Lab Results  Component Value Date   PLT 140* 02/08/2016    Inpatient Medications  Scheduled Meds: . chlordiazePOXIDE  10 mg Oral TID  . folic acid  1 mg Oral Daily  . ipratropium-albuterol  3 mL Nebulization TID  . multivitamin with minerals  1 tablet Oral Daily  . thiamine  100 mg Oral Daily   Or  . thiamine  100 mg Intravenous Daily  . traZODone  200 mg Oral QHS   Continuous Infusions:  PRN Meds:.hydrOXYzine, LORazepam **OR** LORazepam, traMADol  Antibiotics  :    Anti-infectives    Start     Dose/Rate Route Frequency Ordered Stop   02/08/16 2300  cefTRIAXone (ROCEPHIN) 1 g in dextrose 5 % 50 mL IVPB     1 g 100 mL/hr over 30 Minutes Intravenous  Once 02/08/16 2257 02/09/16 0208   02/08/16 2300  azithromycin (ZITHROMAX) 500 mg in dextrose 5 % 250 mL IVPB     500 mg 250 mL/hr over 60 Minutes Intravenous  Once 02/08/16 2257 02/09/16 0134         Objective:   Filed Vitals:   02/09/16 0206 02/09/16 0300 02/09/16 0402 02/09/16 0541  BP: 100/68 88/58 123/79 119/76  Pulse: 95 98 91 90  Temp:   98.3 F (36.8 C) 97.6 F (36.4 C)  TempSrc:   Oral Oral  Resp: 16 15 20 18   Height:   6' (1.829 m)   Weight:   97.886 kg (215 lb 12.8 oz)   SpO2: 91% 93% 95% 91%    Wt Readings from Last 3 Encounters:  02/09/16 97.886 kg (215 lb 12.8 oz)  08/26/15 94.5 kg (208 lb 5.4 oz)  07/05/15 95.255 kg (210 lb)     Intake/Output Summary (Last 24 hours) at 02/09/16 0812 Last data filed at 02/09/16 0555  Gross per 24 hour  Intake      0 ml  Output    700 ml  Net   -700 ml     Physical Exam  Awake Alert, Oriented X 3, No new F.N  deficits, Normal affect [place].AT,PERRAL Supple Neck,No JVD, No cervical lymphadenopathy appriciated.  Symmetrical Chest wall movement, Good air movement bilaterally, CTAB RRR,No Gallops,Rubs or new Murmurs, No Parasternal Heave +ve B.Sounds, Abd Soft, No tenderness, No organomegaly appriciated, No rebound - guarding or rigidity. No Cyanosis, Clubbing or edema, No new Rash or bruise, L leg swollen and mildly cyanotic     Data Review:    CBC  Recent Labs Lab 02/08/16 2200  WBC 5.5  HGB 14.3  HCT 43.2  PLT 140*  MCV 100.5*  MCH 33.3  MCHC 33.1  RDW 14.0  LYMPHSABS 1.6  MONOABS 0.6  EOSABS 0.1  BASOSABS 0.0    Chemistries   Recent Labs Lab 02/08/16 2200  NA 142  K 4.0  CL 109  CO2 24  GLUCOSE 98  BUN <5*  CREATININE 0.79  CALCIUM 8.6*  AST 73*  ALT 47  ALKPHOS 79  BILITOT 0.2*   ------------------------------------------------------------------------------------------------------------------ No results for input(s): CHOL, HDL, LDLCALC, TRIG, CHOLHDL, LDLDIRECT in the last 72 hours.  Lab Results  Component Value Date   HGBA1C 5.2 09/18/2014   ------------------------------------------------------------------------------------------------------------------ No results for input(s): TSH, T4TOTAL, T3FREE, THYROIDAB in the last 72 hours.  Invalid  input(s): FREET3 ------------------------------------------------------------------------------------------------------------------ No results for input(s): VITAMINB12, FOLATE, FERRITIN, TIBC, IRON, RETICCTPCT in the last 72 hours.  Coagulation profile  Recent Labs Lab 02/08/16 2200  INR 0.96    No results for input(s): DDIMER in the last 72 hours.  Cardiac Enzymes No results for input(s): CKMB, TROPONINI, MYOGLOBIN in the last 168 hours.  Invalid input(s): CK ------------------------------------------------------------------------------------------------------------------ No results found for: BNP  Micro  Results No results found for this or any previous visit (from the past 240 hour(s)).  Radiology Reports Dg Chest 2 View  02/08/2016  CLINICAL DATA:  Shortness of Breath EXAM: CHEST  2 VIEW COMPARISON:  Chest radiograph and chest CT August 24, 2015 FINDINGS: There is patchy infiltrate in the left base. There is a calcified granuloma in the superior segment left lower lobe. The lungs elsewhere are clear. Heart size and pulmonary vascularity are normal. No adenopathy. No bone lesions. IMPRESSION: Patchy infiltrate left base, most likely pneumonia. Calcified granuloma superior segment left lower lobe. Lungs elsewhere clear. Stable cardiac silhouette. Followup PA and lateral chest radiographs recommended in 3-4 weeks following trial of antibiotic therapy to ensure resolution and exclude underlying malignancy. Electronically Signed   By: Lowella Grip III M.D.   On: 02/08/2016 21:11   Ct Angio Chest Pe W/cm &/or Wo Cm  02/08/2016  CLINICAL DATA:  Acute onset of shortness of breath and left leg pain. Personal history of DVTs. Decreased O2 saturation. Initial encounter. EXAM: CT ANGIOGRAPHY CHEST WITH CONTRAST TECHNIQUE: Multidetector CT imaging of the chest was performed using the standard protocol during bolus administration of intravenous contrast. Multiplanar CT image reconstructions and MIPs were obtained to evaluate the vascular anatomy. The study was repeated due to limitations in the timing of the first contrast bolus. CONTRAST:  175 mL of Isovue 370 IV contrast COMPARISON:  Chest radiograph performed earlier today at 9:00 p.m. FINDINGS: There is no evidence of central pulmonary embolus. Evaluation for pulmonary embolus is suboptimal due to limitations in the timing of the contrast bolus. Minimal right-sided atelectasis and scarring are noted. A large calcified granuloma is noted at the superior aspect of the left lower lobe. There is no evidence of significant focal consolidation, pleural effusion or  pneumothorax. No masses are identified; no abnormal focal contrast enhancement is seen. The mediastinum is unremarkable appearance. No mediastinal lymphadenopathy is seen. No pericardial effusion is identified. The great vessels are grossly unremarkable in appearance. No axillary lymphadenopathy is seen. The thyroid gland is unremarkable in appearance. The visualized portions of the liver and spleen are unremarkable. The visualized portions of the pancreas, stomach, adrenal glands and kidneys are within normal limits. No acute osseous abnormalities are seen. Review of the MIP images confirms the above findings. IMPRESSION: 1. No evidence of central pulmonary embolus. Evaluation for pulmonary embolus is suboptimal due to limitations in the timing of the contrast bolus. 2. Minimal right-sided atelectasis and scarring noted. Electronically Signed   By: Garald Balding M.D.   On: 02/08/2016 23:54    Time Spent in minutes  30   Armenta Erskin K M.D on 02/09/2016 at 8:12 AM  Between 7am to 7pm - Pager - [phone]  After 7pm go to [email or web address] - password [facility]  [facility] -  Office  [phone]

## 2016-02-10 LAB — URINE CULTURE

## 2016-02-10 LAB — PROTIME-INR
INR: 1.06 (ref 0.00–1.49)
PROTHROMBIN TIME: 13.5 s (ref 11.6–15.2)

## 2016-02-10 LAB — HEPARIN LEVEL (UNFRACTIONATED)
HEPARIN UNFRACTIONATED: 0.36 [IU]/mL (ref 0.30–0.70)
Heparin Unfractionated: 0.49 IU/mL (ref 0.30–0.70)

## 2016-02-10 LAB — CBC
HCT: 36 % — ABNORMAL LOW (ref 39.0–52.0)
Hemoglobin: 11.9 g/dL — ABNORMAL LOW (ref 13.0–17.0)
MCH: 32.8 pg (ref 26.0–34.0)
MCHC: 33.1 g/dL (ref 30.0–36.0)
MCV: 99.2 fL (ref 78.0–100.0)
PLATELETS: 98 10*3/uL — AB (ref 150–400)
RBC: 3.63 MIL/uL — AB (ref 4.22–5.81)
RDW: 13.9 % (ref 11.5–15.5)
WBC: 6.8 10*3/uL (ref 4.0–10.5)

## 2016-02-10 MED ORDER — BOOST / RESOURCE BREEZE PO LIQD
1.0000 | Freq: Three times a day (TID) | ORAL | Status: DC
  Administered 2016-02-10 – 2016-02-11 (×3): 1 via ORAL

## 2016-02-10 MED ORDER — WARFARIN SODIUM 5 MG PO TABS
10.0000 mg | ORAL_TABLET | Freq: Once | ORAL | Status: AC
  Administered 2016-02-10: 10 mg via ORAL
  Filled 2016-02-10: qty 2

## 2016-02-10 NOTE — Progress Notes (Addendum)
ANTICOAGULATION CONSULT NOTE - Follow Up Consult  Pharmacy Consult for Warfarin, Heparin Indication: DVT  No Known Allergies  Patient Measurements: Height: 6' (182.9 cm) Weight: 215 lb 12.8 oz (97.886 kg) IBW/kg (Calculated) : 77.6 Heparin Dosing Weight: 97 kg  Vital Signs: Temp: 98.6 F (37 C) (06/05 0606) Temp Source: Oral (06/05 0606) BP: 138/[name] mmHg (06/05 0606) Pulse Rate: 80 (06/05 0606)  Labs:  Recent Labs  02/08/16 2200 02/09/16 1443 02/09/16 2104 02/10/16 0459  HGB 14.3  --   --  11.9*  HCT 43.2  --   --  36.0*  PLT 140*  --   --  98*  LABPROT 12.6  --   --  13.5  INR 0.96  --   --  1.06  HEPARINUNFRC  --  0.41 0.32 0.49  CREATININE 0.79 0.86  --   --     Estimated Creatinine Clearance: 124.6 mL/min (by C-G formula based on Cr of 0.86).   Medications:  Scheduled:  . folic acid  1 mg Oral Daily  . ipratropium-albuterol  3 mL Nebulization TID  . LORazepam  1 mg Oral QID   Followed by  . LORazepam  1 mg Oral TID   Followed by  . [START ON 02/11/2016] LORazepam  1 mg Oral BID   Followed by  . [START ON 02/13/2016] LORazepam  1 mg Oral Daily  . multivitamin with minerals  1 tablet Oral Daily  . thiamine  100 mg Oral Daily  . traZODone  200 mg Oral QHS  . Warfarin - Pharmacist Dosing Inpatient   Does not apply q1800   Infusions:  . heparin 1,700 Units/hr (02/09/16 2220)    Assessment: [name] admitted on 6/4 with leg pain, SOB, alcohol abuse, and suicidal ideation.  PMH includes chronic left leg DVT, swelling, and history of lupus anticoagulant, and IVC filter placement.  He was originally placed on Xarelto but was non compliant due to financial issues. Per Med Rec pt has not had medication recently and this is supported by labs upon admission. He may be a candidate for thrombolysis, pending radiologist review.  Pharmacy has been consulted for heparin IV and warfarin dosing.  Today, 02/10/2016:  Heparin Level 0.49, therapeutic  INR 1.06, remains  subtherapeutic after only one warfarin dose.  CBC: Hgb decreased (14.3 to 11.9) and Plt decreased (140 to 98)  No bleeding or complications reported  Albumin 3.2, AST elevated, ALT, alk phos, total protein WNL  Diet: eating 25-75% of meals  Drug-drug interactions: azithromycin may increase INR   Goal of Therapy:  INR 2-3 Heparin level 0.3-0.7 units/ml Monitor platelets by anticoagulation protocol: Yes   Plan:   Warfarin 10 mg PO x 1.  Continue heparin IV infusion at 1700 units/hr  Re-check Heparin level this afternoon to confirm therapeutic rate.  Daily INR, heparin level, and CBC  Monitor for signs and symptoms of bleeding.  Warfarin education prior to discharge.  Follow up plans for possible thrombolysis   Gretta Arab PharmD, BCPS Pager [phone] 02/10/2016 7:55 AM   Addendum:  Heparin level 0.36, remains therapeutic  Warfarin education completed on 02/10/16  Gretta Arab PharmD, BCPS Pager [phone] 02/10/2016 3:07 PM

## 2016-02-10 NOTE — Discharge Instructions (Addendum)
Follow with Primary MD  in 7 days   Get CBC, CMP, INR,  checked  by SN[facility] MD in 3 days.    Activity: As tolerated with Full fall precautions use walker/cane & assistance as needed   Disposition [facility]   Diet:   Heart Healthy    For Heart failure patients - Check your Weight same time everyday, if you gain over 2 pounds, or you develop in leg swelling, experience more shortness of breath or chest pain, call your Primary MD immediately. Follow Cardiac Low Salt Diet and 1.5 lit/day fluid restriction.   On your next visit with your primary care physician please Get Medicines reviewed and adjusted.   Please request your Prim.MD to go over all Hospital Tests and Procedure/Radiological results at the follow up, please get all Hospital records sent to your Prim MD by signing hospital release before you go home.   If you experience worsening of your admission symptoms, develop shortness of breath, life threatening emergency, suicidal or homicidal thoughts you must seek medical attention immediately by calling 911 or calling your MD immediately  if symptoms less severe.  You Must read complete instructions/literature along with all the possible adverse reactions/side effects for all the Medicines you take and that have been prescribed to you. Take any new Medicines after you have completely understood and accpet all the possible adverse reactions/side effects.   Do not drive, operate heavy machinery, perform activities at heights, swimming or participation in water activities or provide baby sitting services if your were admitted for syncope or siezures until you have seen by Primary MD or a Neurologist and advised to do so again.  Do not drive when taking Pain medications.    Do not take more than prescribed Pain, Sleep and Anxiety Medications  Special Instructions: If you have smoked or chewed Tobacco  in the last 2 yrs please stop smoking, stop any regular Alcohol  and or any Recreational  drug use.  Wear Seat belts while driving.   Please note  You were cared for by a hospitalist during your hospital stay. If you have any questions about your discharge medications or the care you received while you were in the hospital after you are discharged, you can call the unit and asked to speak with the hospitalist on call if the hospitalist that took care of you is not available. Once you are discharged, your primary care physician will handle any further medical issues. Please note that NO REFILLS for any discharge medications will be authorized once you are discharged, as it is imperative that you return to your primary care physician (or establish a relationship with a primary care physician if you do not have one) for your aftercare needs so that they can reassess your need for medications and monitor your lab values.      Information on my medicine - Coumadin   (Warfarin)  This medication education was reviewed with me or my healthcare representative as part of my discharge preparation.  The pharmacist that spoke with me during my hospital stay was:  Altha Harm  Why was Coumadin prescribed for you? Coumadin was prescribed for you because you have a blood clot or a medical condition that can cause an increased risk of forming blood clots. Blood clots can cause serious health problems by blocking the flow of blood to the heart, lung, or brain. Coumadin can prevent harmful blood clots from forming. As a reminder your indication for Coumadin is:   Deep Vein  Thrombosis Treatment  What test will check on my response to Coumadin? While on Coumadin (warfarin) you will need to have an INR test regularly to ensure that your dose is keeping you in the desired range. The INR (international normalized ratio) number is calculated from the result of the laboratory test called prothrombin time (PT).  If an INR APPOINTMENT HAS NOT ALREADY BEEN MADE FOR YOU please schedule an appointment to have  this lab work done by your health care provider within 7 days. Your INR goal is usually a number between:  2 to 3 or your provider may give you a more narrow range like 2-2.5.  Ask your health care provider during an office visit what your goal INR is.  What  do you need to  know  About  COUMADIN? Take Coumadin (warfarin) exactly as prescribed by your healthcare provider about the same time each day.  DO NOT stop taking without talking to the doctor who prescribed the medication.  Stopping without other blood clot prevention medication to take the place of Coumadin may increase your risk of developing a new clot or stroke.  Get refills before you run out.  What do you do if you miss a dose? If you miss a dose, take it as soon as you remember on the same day then continue your regularly scheduled regimen the next day.  Do not take two doses of Coumadin at the same time.  Important Safety Information A possible side effect of Coumadin (Warfarin) is an increased risk of bleeding. You should call your healthcare provider right away if you experience any of the following: ? Bleeding from an injury or your nose that does not stop. ? Unusual colored urine (red or dark brown) or unusual colored stools (red or black). ? Unusual bruising for unknown reasons. ? A serious fall or if you hit your head (even if there is no bleeding).  Some foods or medicines interact with Coumadin (warfarin) and might alter your response to warfarin. To help avoid this: ? Eat a balanced diet, maintaining a consistent amount of Vitamin K. ? Notify your provider about major diet changes you plan to make. ? Avoid alcohol or limit your intake to 1 drink for women and 2 drinks for men per day. (1 drink is 5 oz. wine, 12 oz. beer, or 1.5 oz. liquor.)  Make sure that ANY health care provider who prescribes medication for you knows that you are taking Coumadin (warfarin).  Also make sure the healthcare provider who is monitoring  your Coumadin knows when you have started a new medication including herbals and non-prescription products.  Coumadin (Warfarin)  Major Drug Interactions  Increased Warfarin Effect Decreased Warfarin Effect  Alcohol (large quantities) Antibiotics (esp. Septra/Bactrim, Flagyl, Cipro) Amiodarone (Cordarone) Aspirin (ASA) Cimetidine (Tagamet) Megestrol (Megace) NSAIDs (ibuprofen, naproxen, etc.) Piroxicam (Feldene) Propafenone (Rythmol SR) Propranolol (Inderal) Isoniazid (INH) Posaconazole (Noxafil) Barbiturates (Phenobarbital) Carbamazepine (Tegretol) Chlordiazepoxide (Librium) Cholestyramine (Questran) Griseofulvin Oral Contraceptives Rifampin Sucralfate (Carafate) Vitamin K   Coumadin (Warfarin) Major Herbal Interactions  Increased Warfarin Effect Decreased Warfarin Effect  Garlic Ginseng Ginkgo biloba Coenzyme Q10 Green tea St. Johns wort    Coumadin (Warfarin) FOOD Interactions  Eat a consistent number of servings per week of foods HIGH in Vitamin K (1 serving =  cup)  Collards (cooked, or boiled & drained) Kale (cooked, or boiled & drained) Mustard greens (cooked, or boiled & drained) Parsley *serving size only =  cup Spinach (cooked, or boiled & drained) Swiss  chard (cooked, or boiled & drained) Turnip greens (cooked, or boiled & drained)  Eat a consistent number of servings per week of foods MEDIUM-HIGH in Vitamin K (1 serving = 1 cup)  Asparagus (cooked, or boiled & drained) Broccoli (cooked, boiled & drained, or raw & chopped) Brussel sprouts (cooked, or boiled & drained) *serving size only =  cup Lettuce, raw (green leaf, endive, romaine) Spinach, raw Turnip greens, raw & chopped   These websites have more information on Coumadin (warfarin):  FailFactory.se; VeganReport.com.au;

## 2016-02-10 NOTE — Progress Notes (Signed)
Initial Nutrition Assessment  INTERVENTION:   Provide Boost Breeze po TID, each supplement provides 250 kcal and 9 grams of protein Encourage PO intake RD to continue to monitor  NUTRITION DIAGNOSIS:   Increased nutrient needs related to other (see comment) (ETOH abuse) as evidenced by estimated needs.  GOAL:   Patient will meet greater than or equal to 90% of their needs  MONITOR:   PO intake, Supplement acceptance, Labs, Weight trends, I & O's  REASON FOR ASSESSMENT:   Malnutrition Screening Tool    ASSESSMENT:   50 y.o. male with medical history significant of CAD, PE, DVT, tobacco and EtOH abuse, HBV, depression. Patient presents to the ED with c/o SOB ongoing for the past month. Also has gradual-onset, worsening, LLE pain, swelling, redness that began "a couple of months ago".  Patient in room with sitter at bedside. Pt on suicide precautions. Pt with variable intakes of 25-100% of regular diet. Pt c/o pain with chewing and this has caused him to eat less than usual. Encouraged softer foods. Pt states he has been consuming 1 meal a day PTA.  Per weight history, pt's weight has remained stable. Pt is willing to try Boost Breeze supplements. Pt does not like supplements with a "milky" consistency.  Patient shows no sign of depletion of muscle mass or body fat.  Labs reviewed. Medications: Folic acid tablet daily, Multivitamin with minerals daily, Thiamine tablet daily  Diet Order:  Diet regular Room service appropriate?: Yes; Fluid consistency:: Thin  Skin:  Reviewed, no issues  Last BM:  6/4  Height:   Ht Readings from Last 1 Encounters:  02/09/16 6' (1.829 m)    Weight:   Wt Readings from Last 1 Encounters:  02/09/16 215 lb 12.8 oz (97.886 kg)    Ideal Body Weight:  80.9 kg  BMI:  Body mass index is 29.26 kg/(m^2).  Estimated Nutritional Needs:   Kcal:  2200-2400  Protein:  110-120g  Fluid:  2.2L/day  EDUCATION NEEDS:   Education needs  addressed  Clayton Bibles, MS, RD, LDN Pager: [phone] After Hours Pager: [phone]

## 2016-02-10 NOTE — Progress Notes (Signed)
Patient ID: [name], male   DOB: 19-Aug-1966, 50 y.o.   MRN: [id number] Request received from [facility] for consideration of left lower extremity DVT thrombolytic therapy on patient. Past medical history significant for coronary artery disease, PE 2011 with IVC filter placement, lupus anticoagulant disorder, tobacco/ alcohol abuse, HBV, depression, and prior DVT on xarelto but noncompliant with use secondary to financial issues. He was admitted to the hospital on 6/4 with worsening left lower extremity pain, redness, swelling . Lower extremity venous duplex study revealed no right lower extremity DVT; there was chronic DVT in the common femoral, femoral, and popliteal veins. Patient is familiar to IR service from prior lower extremity venous thrombolytic therapy in May 2013 (bilat LE) and again in September 2014 (LLE). During this admission patient also expressed suicidal ideation and psychiatric consult has recommended inpatient psychiatric admission following medical clearance. Case was reviewed by Dr. Pascal Lux . Based on above findings and patient's current mental state as well as noncompliance issues would not recommend repeated thrombolytic therapy at this time. Continue current anticoagulation therapy/pharmacy recommendations.

## 2016-02-10 NOTE — Care Management Note (Signed)
Case Management Note  Patient Details  Name: Declin Rajan MRN: [id number] Date of Birth: [birth year]  Subjective/Objective: 50 y/o m admitted w/etoh w/drawal, suicide ideation-psych cons-recc 1:1.IP psych @ d/c. Received referral for xarelto-pharmacy will provide xarelto starter kit that has coupon in kit-for 30day free trial, also patient going to IP psych-they will manage the care.Psych sw following.                   Action/Plan:d/c plan IP psych.   Expected Discharge Date:                  [facility]  [facility]  In-House Referral:  Clinical Social Work  Discharge planning Services  CM Consult  Post Acute Care Choice:    Choice offered to:     DME Arranged:    DME Agency:     HH Arranged:    [facility] [place] [facility] Agency:     Status of Service:  In process, will continue to follow  Medicare Important Message Given:    Date Medicare IM Given:    Medicare IM give by:    Date Additional Medicare IM Given:    Additional Medicare Important Message give by:     If discussed at [place] of Stay Meetings, dates discussed:    Additional Comments:  Dessa Phi, RN 02/10/2016, 1:11 PM

## 2016-02-10 NOTE — Progress Notes (Signed)
PROGRESS NOTE                                                                                                                                                                                                             Patient Demographics:    [name], is a 50 y.o. male, DOB - June 08, 1966, [id number]  Admit date - 02/08/2016   Admitting Physician Etta Quill, DO  Outpatient Primary MD for the patient is No primary care provider on file.  LOS - 1  Chief Complaint  Patient presents with  . Leg Pain  . Back Pain  . Shortness of Breath       Brief Narrative   [name] is a 50 y.o. male with medical history significant of CAD, PE, DVT, tobacco and EtOH abuse, HBV, depression. Patient presents to the ED with c/o SOB ongoing for the past month. Also has gradual-onset, worsening, LLE pain, swelling, redness that began "a couple of months ago". Per EMS o2 initially 85% on room air, improved with [place]. He has an IVC in place due to h/o DVT. 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 He is not compliant with his xeralto due to financial issues he says.  ED Course: EtOH > 300, patient states his is also suicidal, initially concern for hypotension so he was given an IVF bolus; however, when a proper sized BP cuff was fitted to the patient he wasn't hypotensive. Initially concern for PNA on CXR; however, CTA PE protocol was unremarkable. Admitting for obs given new oxygen requirement of unclear etiology.     Subjective:    [name] today has, No headache, No chest pain, No abdominal pain - No Nausea, No new weakness tingling or numbness, No Cough - SOB.    Assessment  & Plan :     1.Left Leg DVT Chronic - ++ swelling, was non complaint with [facility] due to financial issues, has IVC filter, CTA Chest negative, large blood clot in the left leg, have consulted IR to evaluate for possible Thrombectomy L Leg, for now Heparin and  Coumadin - probably safest with Alcohol abuse.   2. Alcohol abuse - counseled, CIWA & scheduled Librium.   3.Depression - Suicidal in the ER - Psych called, and the new sitter and place him in inpatient psych facility per psych, for now  continue on Trazadone.    Code Status :  Full  Family Communication  :  None  Disposition Plan  :  Stay inpatient after medically stable inpatient psych recommended by psychiatrist  Consults  :  IR, Psych  Procedures  :    CTA chest - No PE  Venous US legs - Right: No evidence of DVT or superficial thrombosis. Baker's cyst noted in the popliteal fossa measuring 4.24 cm x 1.44 cm. 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 No evidence of superficial thrombosis or Baker's cyst  DVT Prophylaxis  :  - Heparin -    Lab Results  Component Value Date   PLT 98* 02/10/2016    Inpatient Medications  Scheduled Meds: . folic acid  1 mg Oral Daily  . ipratropium-albuterol  3 mL Nebulization TID  . LORazepam  1 mg Oral QID   Followed by  . LORazepam  1 mg Oral TID   Followed by  . [START ON 02/11/2016] LORazepam  1 mg Oral BID   Followed by  . [START ON 02/13/2016] LORazepam  1 mg Oral Daily  . multivitamin with minerals  1 tablet Oral Daily  . thiamine  100 mg Oral Daily  . traZODone  200 mg Oral QHS  . Warfarin - Pharmacist Dosing Inpatient   Does not apply q1800   Continuous Infusions: . heparin 1,700 Units/hr (02/09/16 2220)   PRN Meds:.hydrOXYzine, ibuprofen, loperamide, LORazepam **OR** LORazepam, ondansetron  Antibiotics  :    Anti-infectives    Start     Dose/Rate Route Frequency Ordered Stop   02/08/16 2300  cefTRIAXone (ROCEPHIN) 1 g in dextrose 5 % 50 mL IVPB     1 g 100 mL/hr over 30 Minutes Intravenous  Once 02/08/16 2257 02/09/16 0208   02/08/16 2300  azithromycin (ZITHROMAX) 500 mg in dextrose 5 % 250 mL IVPB     500 mg 250 mL/hr over 60 Minutes Intravenous  Once 02/08/16 2257  02/09/16 0134         Objective:   Filed Vitals:   02/09/16 2322 02/10/16 0000 02/10/16 0606 02/10/16 0742  BP: 145/90  138/87   Pulse: 74 80 80   Temp: 98.7 F (37.1 C)  98.6 F (37 C)   TempSrc: Oral  Oral   Resp: 20  20   Height:      Weight:      SpO2: 96%  97% 94%    Wt Readings from Last 3 Encounters:  02/09/16 97.886 kg (215 lb 12.8 oz)  08/26/15 94.5 kg (208 lb 5.4 oz)  07/05/15 95.255 kg (210 lb)     Intake/Output Summary (Last 24 hours) at 02/10/16 0852 Last data filed at 02/10/16 0700  Gross per 24 hour  Intake 1605.26 ml  Output   1750 ml  Net -144.74 ml     Physical Exam  Awake Alert, Oriented X 3, No new F.N deficits, Normal affect [place].AT,PERRAL Supple Neck,No JVD, No cervical lymphadenopathy appriciated.  Symmetrical Chest wall movement, Good air movement bilaterally, CTAB RRR,No Gallops,Rubs or new Murmurs, No Parasternal Heave +ve B.Sounds, Abd Soft, No tenderness, No organomegaly appriciated, No rebound - guarding or rigidity. No Cyanosis, Clubbing or edema, No new Rash or bruise, L leg swollen      Data Review:    CBC  Recent Labs Lab 02/08/16 2200 02/10/16 0459  WBC 5.5 6.8  HGB 14.3 11.9*  HCT 43.2 36.0*  PLT 140*  98*  MCV 100.5* 99.2  MCH 33.3 32.8  MCHC 33.1 33.1  RDW 14.0 13.9  LYMPHSABS 1.6  --   MONOABS 0.6  --   EOSABS 0.1  --   BASOSABS 0.0  --     Chemistries   Recent Labs Lab 02/08/16 2200 02/09/16 1443  NA 142 138  K 4.0 3.9  CL 109 107  CO2 24 23  GLUCOSE 98 119*  BUN <5* <5*  CREATININE 0.79 0.86  CALCIUM 8.6* 7.8*  AST 73* 78*  ALT 47 46  ALKPHOS 79 73  BILITOT 0.2* 0.5   ------------------------------------------------------------------------------------------------------------------ No results for input(s): CHOL, HDL, LDLCALC, TRIG, CHOLHDL, LDLDIRECT in the last 72 hours.  Lab Results  Component Value Date   HGBA1C 5.2 09/18/2014    ------------------------------------------------------------------------------------------------------------------ No results for input(s): TSH, T4TOTAL, T3FREE, THYROIDAB in the last 72 hours.  Invalid input(s): FREET3 ------------------------------------------------------------------------------------------------------------------ No results for input(s): VITAMINB12, FOLATE, FERRITIN, TIBC, IRON, RETICCTPCT in the last 72 hours.  Coagulation profile  Recent Labs Lab 02/08/16 2200 02/10/16 0459  INR 0.96 1.06    No results for input(s): DDIMER in the last 72 hours.  Cardiac Enzymes No results for input(s): CKMB, TROPONINI, MYOGLOBIN in the last 168 hours.  Invalid input(s): CK ------------------------------------------------------------------------------------------------------------------ No results found for: BNP  Micro Results No results found for this or any previous visit (from the past 240 hour(s)).  Radiology Reports Dg Chest 2 View  02/08/2016  CLINICAL DATA:  Shortness of Breath EXAM: CHEST  2 VIEW COMPARISON:  Chest radiograph and chest CT August 24, 2015 FINDINGS: There is patchy infiltrate in the left base. There is a calcified granuloma in the superior segment left lower lobe. The lungs elsewhere are clear. Heart size and pulmonary vascularity are normal. No adenopathy. No bone lesions. IMPRESSION: Patchy infiltrate left base, most likely pneumonia. Calcified granuloma superior segment left lower lobe. Lungs elsewhere clear. Stable cardiac silhouette. Followup PA and lateral chest radiographs recommended in 3-4 weeks following trial of antibiotic therapy to ensure resolution and exclude underlying malignancy. Electronically Signed   By: Lowella Grip III M.D.   On: 02/08/2016 21:11   Ct Angio Chest Pe W/cm &/or Wo Cm  02/08/2016  CLINICAL DATA:  Acute onset of shortness of breath and left leg pain. Personal history of DVTs. Decreased O2 saturation. Initial  encounter. EXAM: CT ANGIOGRAPHY CHEST WITH CONTRAST TECHNIQUE: Multidetector CT imaging of the chest was performed using the standard protocol during bolus administration of intravenous contrast. Multiplanar CT image reconstructions and MIPs were obtained to evaluate the vascular anatomy. The study was repeated due to limitations in the timing of the first contrast bolus. CONTRAST:  175 mL of Isovue 370 IV contrast COMPARISON:  Chest radiograph performed earlier today at 9:00 p.m. FINDINGS: There is no evidence of central pulmonary embolus. Evaluation for pulmonary embolus is suboptimal due to limitations in the timing of the contrast bolus. Minimal right-sided atelectasis and scarring are noted. A large calcified granuloma is noted at the superior aspect of the left lower lobe. There is no evidence of significant focal consolidation, pleural effusion or pneumothorax. No masses are identified; no abnormal focal contrast enhancement is seen. The mediastinum is unremarkable appearance. No mediastinal lymphadenopathy is seen. No pericardial effusion is identified. The great vessels are grossly unremarkable in appearance. No axillary lymphadenopathy is seen. The thyroid gland is unremarkable in appearance. The visualized portions of the liver and spleen are unremarkable. The visualized portions of the pancreas, stomach, adrenal glands and kidneys  are within normal limits. No acute osseous abnormalities are seen. Review of the MIP images confirms the above findings. IMPRESSION: 1. No evidence of central pulmonary embolus. Evaluation for pulmonary embolus is suboptimal due to limitations in the timing of the contrast bolus. 2. Minimal right-sided atelectasis and scarring noted. Electronically Signed   By: Garald Balding M.D.   On: 02/08/2016 23:54    Time Spent in minutes  30   SINGH,PRASHANT K M.D on 02/10/2016 at 8:52 AM  Between 7am to 7pm - Pager - [phone]  After 7pm go to [email or web address] - password  [facility]  [facility] -  Office  [phone]

## 2016-02-11 LAB — CBC
HEMATOCRIT: 36.6 % — AB (ref 39.0–52.0)
HEMOGLOBIN: 12.2 g/dL — AB (ref 13.0–17.0)
MCH: 33.3 pg (ref 26.0–34.0)
MCHC: 33.3 g/dL (ref 30.0–36.0)
MCV: 100 fL (ref 78.0–100.0)
Platelets: 103 10*3/uL — ABNORMAL LOW (ref 150–400)
RBC: 3.66 MIL/uL — ABNORMAL LOW (ref 4.22–5.81)
RDW: 13.9 % (ref 11.5–15.5)
WBC: 5.1 10*3/uL (ref 4.0–10.5)

## 2016-02-11 LAB — PROTIME-INR
INR: 1.15 (ref 0.00–1.49)
PROTHROMBIN TIME: 14.4 s (ref 11.6–15.2)

## 2016-02-11 LAB — HEPARIN LEVEL (UNFRACTIONATED): Heparin Unfractionated: 0.38 IU/mL (ref 0.30–0.70)

## 2016-02-11 MED ORDER — WARFARIN SODIUM 5 MG PO TABS
10.0000 mg | ORAL_TABLET | Freq: Once | ORAL | Status: AC
  Administered 2016-02-11: 10 mg via ORAL
  Filled 2016-02-11: qty 2

## 2016-02-11 MED ORDER — ENOXAPARIN SODIUM 150 MG/ML ~~LOC~~ SOLN
150.0000 mg | SUBCUTANEOUS | Status: DC
  Administered 2016-02-11 – 2016-02-12 (×2): 150 mg via SUBCUTANEOUS
  Filled 2016-02-11 (×3): qty 1

## 2016-02-11 MED ORDER — THIAMINE HCL 100 MG PO TABS: 100.0000 mg | ORAL_TABLET | Freq: Every day | ORAL | Status: DC

## 2016-02-11 MED ORDER — ENOXAPARIN SODIUM 150 MG/ML ~~LOC~~ SOLN: 150.0000 mg | SUBCUTANEOUS | Status: DC

## 2016-02-11 MED ORDER — CARVEDILOL 6.25 MG PO TABS
6.2500 mg | ORAL_TABLET | Freq: Two times a day (BID) | ORAL | Status: DC
  Administered 2016-02-11 – 2016-02-12 (×4): 6.25 mg via ORAL
  Filled 2016-02-11 (×4): qty 1

## 2016-02-11 MED ORDER — WARFARIN SODIUM 5 MG PO TABS: 5.0000 mg | ORAL_TABLET | Freq: Every day | ORAL | Status: DC

## 2016-02-11 MED ORDER — CARVEDILOL 6.25 MG PO TABS: 6.2500 mg | ORAL_TABLET | Freq: Two times a day (BID) | ORAL | Status: DC

## 2016-02-11 MED ORDER — FOLIC ACID 1 MG PO TABS: 1.0000 mg | ORAL_TABLET | Freq: Every day | ORAL | Status: DC

## 2016-02-11 NOTE — Progress Notes (Signed)
LCSWA aware of Psych. Consult. Referred out for inpatient:  [facility]: reports patient is not medically stable at this time for acceptance, will try again tomorrow. [facility]: Denied Patient at this time. [id number]. [facility]: Pending  Kathrin Greathouse, Latanya Presser, MSW Clinical Social Worker 5E and Psychiatric Service Line [phone] 02/11/2016  2:21 PM

## 2016-02-11 NOTE — Progress Notes (Addendum)
ANTICOAGULATION CONSULT NOTE - Follow Up Consult  Pharmacy Consult for Warfarin, Heparin Indication: DVT  No Known Allergies  Patient Measurements: Height: 6' (182.9 cm) Weight: 215 lb 12.8 oz (97.886 kg) IBW/kg (Calculated) : 77.6 Heparin Dosing Weight: 97 kg  Vital Signs: Temp: 98.7 F (37.1 C) (06/06 0500) Temp Source: Oral (06/06 0500) BP: 154/79 mmHg (06/06 0500) Pulse Rate: 100 (06/06 0500)  Labs:  Recent Labs  02/08/16 2200 02/09/16 1443  02/10/16 0459 02/10/16 1355 02/11/16 0521  HGB 14.3  --   --  11.9*  --  12.2*  HCT 43.2  --   --  36.0*  --  36.6*  PLT 140*  --   --  98*  --  103*  LABPROT 12.6  --   --  13.5  --  14.4  INR 0.96  --   --  1.06  --  1.15  HEPARINUNFRC  --  0.41  < > 0.49 0.36 0.38  CREATININE 0.79 0.86  --   --   --   --   < > = values in this interval not displayed.  Estimated Creatinine Clearance: 124.6 mL/min (by C-G formula based on Cr of 0.86).   Medications:  Scheduled:  . feeding supplement  1 Container Oral TID BM  . folic acid  1 mg Oral Daily  . ipratropium-albuterol  3 mL Nebulization TID  . LORazepam  1 mg Oral TID   Followed by  . LORazepam  1 mg Oral BID   Followed by  . [START ON 02/13/2016] LORazepam  1 mg Oral Daily  . multivitamin with minerals  1 tablet Oral Daily  . thiamine  100 mg Oral Daily  . traZODone  200 mg Oral QHS  . Warfarin - Pharmacist Dosing Inpatient   Does not apply q1800   Infusions:  . heparin 1,700 Units/hr (02/11/16 0349)    Assessment: 41 yoM admitted on 6/4 with leg pain, SOB, alcohol abuse, and suicidal ideation.  PMH includes chronic left leg DVT, swelling, and history of lupus anticoagulant, and IVC filter placement.  He was originally placed on Xarelto but was non compliant due to financial issues. Per Med Rec pt has not had medication recently and this is supported by labs upon admission. He is not a candidate for thrombolysis.  Pharmacy has been consulted for heparin IV and warfarin  dosing.  Today, 02/11/2016:  Heparin Level 0.38, therapeutic  INR 1.15, remains subtherapeutic but increasing after 2 warfarin doses.  CBC: Hgb increased to 12.2, and Plt improved to 103  No bleeding or complications reported  Albumin 3.2, AST elevated, ALT, alk phos, total protein WNL  SCr 0.86, CrCl > 100 ml/min  Diet: eating 25-100% of meals  Drug-drug interactions: azithromycin may increase INR (dose given 6/3)  Warfarin education completed on 02/10/16  Goal of Therapy:  INR 2-3 Heparin level 0.3-0.7 units/ml Monitor platelets by anticoagulation protocol: Yes   Plan:   Warfarin 10 mg PO x 1.  Continue heparin IV infusion at 1700 units/hr  Daily INR, heparin level, and CBC  Monitor for signs and symptoms of bleeding.  Gretta Arab PharmD, BCPS Pager [phone] 02/11/2016 7:03 AM   Addendum: Pharmacy is consulted to change from Heparin to Lovenox for bridge to warfarin for VTE. Plan: Continue warfarin 63m today as ordered. D/C Heparin drip at 0800 Give Lovenox 150 mg (1.5 mg/kg) Q 24 hr at [place], BCPS Pager [phone]/02/2016 7:56 AM

## 2016-02-11 NOTE — Discharge Summary (Signed)
[name], is a 50 y.o. male  DOB 1965/10/09  MRN [id number].  Admission date:  02/08/2016  Admitting Physician  Etta Quill, DO  Discharge Date:  02/11/2016   Primary MD  No primary care provider on file.  Recommendations for primary care physician for things to follow:   CBC, INR and BMP in 2-3 days. Stop Lovenox once INR reaches 2 and stays above 2 for 48 hours.   Admission Diagnosis  Suicidal ideation [R45.851] Hypoxic [R09.02] DVT (deep venous thrombosis), left [I82.402] Alcohol intoxication, uncomplicated (HCC) 123456 Cellulitis, unspecified cellulitis site, unspecified extremity site, unspecified laterality [L03.90] Aspiration pneumonia, unspecified aspiration pneumonia type, unspecified laterality, unspecified part of lung ([place]) [J69.0]   Discharge Diagnosis  Suicidal ideation [R45.851] Hypoxic [R09.02] DVT (deep venous thrombosis), left [I82.402] Alcohol intoxication, uncomplicated (HCC) 123456 Cellulitis, unspecified cellulitis site, unspecified extremity site, unspecified laterality [L03.90] Aspiration pneumonia, unspecified aspiration pneumonia type, unspecified laterality, unspecified part of lung ([place]) [J69.0]     Principal Problem:   Alcohol use disorder, severe, dependence ([place]) Active Problems:   Lupus anticoagulant disorder ([place])   Deep vein thrombosis ([place])   Suicidal ideation   Alcohol abuse with intoxication ([place])   Major depressive disorder, recurrent, severe without psychotic features ([place])   Acute respiratory failure with hypoxia ([place])      Past Medical History  Diagnosis Date  . Coronary artery disease   . Degenerative joint disease of spine   . Pulmonary embolism ([place]) 06/2010  . Degenerative joint disease   . 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 ETOH  abuse   . DVT (deep venous thrombosis) ([place])   . Hepatitis B   . Depression   . Collagen vascular disease [facility])     Past Surgical History  Procedure Laterality Date  . Left elbow surgery    . Tonsillectomy         HPI  from the history and physical done on the day of admission:    [name] is a 50 y.o. male with medical history significant of CAD, PE, DVT, tobacco and EtOH abuse, HBV, depression. Patient presents to the ED with c/o SOB ongoing for the past month. Also has gradual-onset, worsening, LLE pain, swelling, redness that began "a couple of months ago". Per EMS o2 initially 85% on room air, improved with [place]. He has an IVC in place due to h/o DVT. 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 He is not compliant with his xeralto due to financial issues he says.  ED Course: EtOH > 300, patient states his is also suicidal, initially concern for hypotension so he was given an IVF bolus; however, when a proper sized BP cuff was fitted to the patient he wasn't hypotensive. Initially concern for PNA on CXR; however, CTA PE protocol was unremarkable. Admitting for obs given new oxygen requirement of unclear etiology.    Hospital Course:     1.Left Leg DVT Chronic - ++ swelling, was non complaint with [facility] due to financial issues, has IVC filter,  CTA Chest negative, Venous ultrasound confirmed large blood clot in the left leg, have consulted IR to evaluate for possible Thrombectomy L Leg, however I was called and informed by IR that he has underwent thrombectomy twice in the past, at this time I do not think this will serve any benefit due to his continued noncompliance, he's at the present time kept on Lovenox and Coumadin overlap, this is probably the safest regimen for his in the light of his noncompliance and financial issues.   He was tried on new anticoagulation just multiple times in the past but says every time there is a financial hurdle after his 1 month. Off free supply is over,  continue Lovenox and Coumadin overlap, check INR every 48 hours, stop Lovenox once INR stays above 2 for at least 48 hours.   Note he is safe to be discharged to a monitored facility like psych unit, not safe for discharge for home I do not trust him to take himself off of Lovenox when appropriate. Will require a PCP after discharge for follow-up on his INR and to monitor his compliance. He should not be provided with more than 2 week supply of Coumadin at a time.   2. Alcohol abuse - counseled, was on CIWA protocol, no signs of DTs at this time, placed on folate acid and thiamine.   3.Depression - Suicidal in the ER - Psych called, for now continue on Trazadone. Will require inpatient psych admission per psych. Will be discharged to inpatient psych facility.  4. Hypertension. Placed on Coreg.     Follow UP  Follow-up Information    Follow up with [facility]. Schedule an appointment as soon as possible for a visit in 1 week.   Contact information:   [address] [place]  [phone]       Consults obtained - IR, psych  Discharge Condition: Fair  Diet and Activity recommendation: See Discharge Instructions below  Discharge Instructions           Discharge Instructions    Diet - low sodium heart healthy    Complete by:  As directed      Discharge instructions    Complete by:  As directed   Follow with Primary MD  in 7 days   Get CBC, CMP, INR,  checked  by SNF/[facility] MD in 3 days.    Activity: As tolerated with Full fall precautions use walker/cane & assistance as needed   Disposition [facility]   Diet:   Heart Healthy    For Heart failure patients - Check your Weight same time everyday, if you gain over 2 pounds, or you develop in leg swelling, experience more shortness of breath or chest pain, call your Primary MD immediately. Follow Cardiac Low Salt Diet and 1.5 lit/day fluid restriction.   On your next  visit with your primary care physician please Get Medicines reviewed and adjusted.   Please request your Prim.MD to go over all Hospital Tests and Procedure/Radiological results at the follow up, please get all Hospital records sent to your Prim MD by signing hospital release before you go home.   If you experience worsening of your admission symptoms, develop shortness of breath, life threatening emergency, suicidal or homicidal thoughts you must seek medical attention immediately by calling 911 or calling your MD immediately  if symptoms less severe.  You Must read complete instructions/literature along with all the possible adverse reactions/side effects for all the  Medicines you take and that have been prescribed to you. Take any new Medicines after you have completely understood and accpet all the possible adverse reactions/side effects.   Do not drive, operate heavy machinery, perform activities at heights, swimming or participation in water activities or provide baby sitting services if your were admitted for syncope or siezures until you have seen by Primary MD or a Neurologist and advised to do so again.  Do not drive when taking Pain medications.    Do not take more than prescribed Pain, Sleep and Anxiety Medications  Special Instructions: If you have smoked or chewed Tobacco  in the last 2 yrs please stop smoking, stop any regular Alcohol  and or any Recreational drug use.  Wear Seat belts while driving.   Please note  You were cared for by a hospitalist during your hospital stay. If you have any questions about your discharge medications or the care you received while you were in the hospital after you are discharged, you can call the unit and asked to speak with the hospitalist on call if the hospitalist that took care of you is not available. Once you are discharged, your primary care physician will handle any further medical issues. Please note that NO REFILLS for any discharge  medications will be authorized once you are discharged, as it is imperative that you return to your primary care physician (or establish a relationship with a primary care physician if you do not have one) for your aftercare needs so that they can reassess your need for medications and monitor your lab values.     Increase activity slowly    Complete by:  As directed              Discharge Medications       Medication List    STOP taking these medications        levofloxacin 500 MG tablet  Commonly known as:  LEVAQUIN     rivaroxaban 20 MG Tabs tablet  Commonly known as:  XARELTO      TAKE these medications        acamprosate 333 MG tablet  Commonly known as:  CAMPRAL  Take 2 tablets (666 mg total) by mouth 3 (three) times daily with meals. For alcoholism     carvedilol 6.25 MG tablet  Commonly known as:  COREG  Take 1 tablet (6.25 mg total) by mouth 2 (two) times daily with a meal.     citalopram 20 MG tablet  Commonly known as:  CELEXA  Take 1 tablet (20 mg total) by mouth daily. For depression     enoxaparin 150 MG/ML injection  Commonly known as:  LOVENOX  Inject 1 mL (150 mg total) into the skin daily.     folic acid 1 MG tablet  Commonly known as:  FOLVITE  Take 1 tablet (1 mg total) by mouth daily.     hydrOXYzine 25 MG tablet  Commonly known as:  ATARAX/VISTARIL  Take 25 mg by mouth 3 (three) times daily as needed.     thiamine 100 MG tablet  Take 1 tablet (100 mg total) by mouth daily.     traZODone 100 MG tablet  Commonly known as:  DESYREL  Take 200 mg by mouth at bedtime. Reported on 02/08/2016     warfarin 5 MG tablet  Commonly known as:  COUMADIN  Take 1 tablet (5 mg total) by mouth daily.        Major procedures  and Radiology Reports - PLEASE review detailed and final reports for all details, in brief -   CTA chest - No PE  Venous US legs - Right: No evidence of DVT or superficial thrombosis. Baker's cyst noted in the popliteal fossa  measuring 4.24 cm x 1.44 cm. 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 No evidence of superficial thrombosis or Baker's cyst   Dg Chest 2 View  02/08/2016  CLINICAL DATA:  Shortness of Breath EXAM: CHEST  2 VIEW COMPARISON:  Chest radiograph and chest CT August 24, 2015 FINDINGS: There is patchy infiltrate in the left base. There is a calcified granuloma in the superior segment left lower lobe. The lungs elsewhere are clear. Heart size and pulmonary vascularity are normal. No adenopathy. No bone lesions. IMPRESSION: Patchy infiltrate left base, most likely pneumonia. Calcified granuloma superior segment left lower lobe. Lungs elsewhere clear. Stable cardiac silhouette. Followup PA and lateral chest radiographs recommended in 3-4 weeks following trial of antibiotic therapy to ensure resolution and exclude underlying malignancy. Electronically Signed   By: Lowella Grip III M.D.   On: 02/08/2016 21:11   Ct Angio Chest Pe W/cm &/or Wo Cm  02/08/2016  CLINICAL DATA:  Acute onset of shortness of breath and left leg pain. Personal history of DVTs. Decreased O2 saturation. Initial encounter. EXAM: CT ANGIOGRAPHY CHEST WITH CONTRAST TECHNIQUE: Multidetector CT imaging of the chest was performed using the standard protocol during bolus administration of intravenous contrast. Multiplanar CT image reconstructions and MIPs were obtained to evaluate the vascular anatomy. The study was repeated due to limitations in the timing of the first contrast bolus. CONTRAST:  175 mL of Isovue 370 IV contrast COMPARISON:  Chest radiograph performed earlier today at 9:00 p.m. FINDINGS: There is no evidence of central pulmonary embolus. Evaluation for pulmonary embolus is suboptimal due to limitations in the timing of the contrast bolus. Minimal right-sided atelectasis and scarring are noted. A large calcified granuloma is noted at the superior aspect of the left lower lobe. There  is no evidence of significant focal consolidation, pleural effusion or pneumothorax. No masses are identified; no abnormal focal contrast enhancement is seen. The mediastinum is unremarkable appearance. No mediastinal lymphadenopathy is seen. No pericardial effusion is identified. The great vessels are grossly unremarkable in appearance. No axillary lymphadenopathy is seen. The thyroid gland is unremarkable in appearance. The visualized portions of the liver and spleen are unremarkable. The visualized portions of the pancreas, stomach, adrenal glands and kidneys are within normal limits. No acute osseous abnormalities are seen. Review of the MIP images confirms the above findings. IMPRESSION: 1. No evidence of central pulmonary embolus. Evaluation for pulmonary embolus is suboptimal due to limitations in the timing of the contrast bolus. 2. Minimal right-sided atelectasis and scarring noted. Electronically Signed   By: Garald Balding M.D.   On: 02/08/2016 23:54    Micro Results      Recent Results (from the past 240 hour(s))  Blood Culture (routine x 2)     Status: None (Preliminary result)   Collection Time: 02/09/16 12:13 AM  Result Value Ref Range Status   Specimen Description BLOOD RIGHT ANTECUBITAL  Final   Special Requests BOTTLES DRAWN AEROBIC AND ANAEROBIC 7ML EA  Final   Culture   Final    NO GROWTH 1 DAY Performed at [facility]    Report Status PENDING  Incomplete  Blood Culture (routine x 2)  Status: None (Preliminary result)   Collection Time: 02/09/16 12:15 AM  Result Value Ref Range Status   Specimen Description BLOOD LEFT ANTECUBITAL  Final   Special Requests BOTTLES DRAWN AEROBIC AND ANAEROBIC 5ML EA  Final   Culture   Final    NO GROWTH 1 DAY Performed at [facility]    Report Status PENDING  Incomplete  Urine culture     Status: Abnormal   Collection Time: 02/09/16  5:53 AM  Result Value Ref Range Status   Specimen Description URINE, CLEAN CATCH   Final   Special Requests NONE  Final   Culture (A)  Final    <10,000 COLONIES/mL INSIGNIFICANT GROWTH Performed at [facility]    Report Status 02/10/2016 FINAL  Final    Today   Subjective    [name] today has no headache,no chest abdominal pain,no new weakness tingling or numbness, feels much better.    Objective   Blood pressure 154/79, pulse 100, temperature 98.7 F (37.1 C), temperature source Oral, resp. rate 20, height 6' (1.829 m), weight 97.886 kg (215 lb 12.8 oz), SpO2 93 %.   Intake/Output Summary (Last 24 hours) at 02/11/16 1044 Last data filed at 02/11/16 0700  Gross per 24 hour  Intake    905 ml  Output   1500 ml  Net   -595 ml    Exam Awake Alert, Oriented x 3, No new F.N deficits, Normal affect [place].AT,PERRAL Supple Neck,No JVD, No cervical lymphadenopathy appriciated.  Symmetrical Chest wall movement, Good air movement bilaterally, CTAB RRR,No Gallops,Rubs or new Murmurs, No Parasternal Heave +ve B.Sounds, Abd Soft, Non tender, No organomegaly appriciated, No rebound -guarding or rigidity. No Cyanosis, Clubbing or edema, No new Rash or bruise, L leg swollen   Data Review   CBC w Diff:  Lab Results  Component Value Date   WBC 5.1 02/11/2016   WBC 8.0 02/07/2014   HGB 12.2* 02/11/2016   HGB 14.3 02/07/2014   HCT 36.6* 02/11/2016   HCT 42.4 02/07/2014   PLT 103* 02/11/2016   PLT 236 02/07/2014   LYMPHOPCT 30 02/08/2016   LYMPHOPCT 17.0 02/07/2014   MONOPCT 10 02/08/2016   MONOPCT 10.7 02/07/2014   EOSPCT 1 02/08/2016   EOSPCT 2.3 02/07/2014   BASOPCT 1 02/08/2016   BASOPCT 0.8 02/07/2014    CMP:  Lab Results  Component Value Date   NA 138 02/09/2016   NA 141 02/07/2014   K 3.9 02/09/2016   K 4.5 02/07/2014   CL 107 02/09/2016   CL 109* 01/19/2013   CO2 23 02/09/2016   CO2 18* 02/07/2014   BUN <5* 02/09/2016   BUN 8.4 02/07/2014   CREATININE 0.86 02/09/2016   CREATININE 0.8 02/07/2014   PROT 6.8 02/09/2016   PROT  7.5 02/07/2014   ALBUMIN 3.2* 02/09/2016   ALBUMIN 4.0 02/07/2014   BILITOT 0.5 02/09/2016   BILITOT 0.33 02/07/2014   ALKPHOS 73 02/09/2016   ALKPHOS 72 02/07/2014   AST 78* 02/09/2016   AST 25 02/07/2014   ALT 46 02/09/2016   ALT 21 02/07/2014  . Lab Results  Component Value Date   INR 1.15 02/11/2016   INR 1.06 02/10/2016   INR 0.96 02/08/2016   PROTIME 15.6* 12/22/2012   PROTIME 32.4* 10/14/2012   PROTIME Sent out for confirmation. 04/12/2012     Total Time in preparing paper work, data evaluation and todays exam - 35 minutes  Lala Lund K M.D on 02/11/2016 at 10:44 AM  [facility]   Office  [phone]

## 2016-02-11 NOTE — Care Management Note (Signed)
Case Management Note  Patient Details  Name: [name] MRN: [id number] Date of Birth: [birth year]  Subjective/Objective: d/c summary-IP psych. CSW notified.                   Action/Plan:d/c IP psych.   Expected Discharge Date:                  Expected Discharge Plan:  Psychiatric Hospital  In-House Referral:  Clinical Social Work  Discharge planning Services  CM Consult  Post Acute Care Choice:    Choice offered to:     DME Arranged:    DME Agency:     HH Arranged:    [place] Agency:     Status of Service:  Completed, signed off  Medicare Important Message Given:    Date Medicare IM Given:    Medicare IM give by:    Date Additional Medicare IM Given:    Additional Medicare Important Message give by:     If discussed at [place] of Stay Meetings, dates discussed:    Additional Comments:  Dessa Phi, RN 02/11/2016, 11:48 AM

## 2016-02-11 NOTE — Clinical Social Work Psych Assess (Signed)
Clinical Social Work Nature conservation officer  Clinical Social Worker:  Lia Hopping, LCSW Date/Time:  02/11/2016, 1:44 PM Referred By:  Care Management Date Referred:  02/11/16 Reason for Referral:  Behavioral Health Issues, Psychosocial Assessment   Presenting Symptoms/Problems  Presenting Symptoms/Problems(in person's/family's own words): " I have nothing, everything was taken from me. All I have are the clothes on my back. Everything has been taken from me my medical insurance and I have been turned down from disability several times. My health is getting worse and I have these blood clots. I just don't feel like I have a reason to be here anymore."  Problems: Patient states that he came to the ED few days ago due to SOB and left lower leg pain, swelling, and redness that began "a couple months ago." Today, patient endorses suicidal thoughts with no plan, depressive symptoms, excessive worries, anxiety, low energy level, lack of motivation, hopelessness and feeling like a failure due to his inability to stop drinking  Abuse/Neglect/Trauma History  Abuse/Neglect/Trauma History:  Denies History Abuse/Neglect/Trauma History Comments (indicate dates): n/a  Psychiatric History  Psychiatric History:  Inpatient/Hospitalization Psychiatric Medication:    Current Mental Health Hospitalizations/Previous Mental Health History: Patient reports he has been to [facility] in the past three or four times for his depression.   Current Provider:  n/a Place and Date:  n/a  Current Medications:  Medications 02/11/16 02/12/16 02/13/16 02/14/16 02/15/16 02/16/16 02/17/16  carvedilol (COREG) tablet 6.25 mg Dose: 6.25 mg Freq: 2 times daily with meals Route: PO Start: 02/11/16 1100   Admin Instructions:  Hold if SBP<110 or H.Rate <65 (BETA BLOCKER)   1215    1700     0800    1700     0800    1700     0800    1700     0800    1700     0800    1700     0800    1700      enoxaparin (LOVENOX) injection 150 mg Dose: 150 mg Freq: Every 24 hours Route: [place] Start: 02/11/16 0900   Admin Instructions:  Do NOT expel air bubble from syringe before giving.   0915     0900     0900     0900     0900     0900     0900      feeding supplement (BOOST / RESOURCE BREEZE) liquid 1 Container Dose: 1 Container Freq: 3 times daily between meals Route: PO Start: 02/10/16 1400   1000    1400    2000     1000    1400    2000     1000    1400    2000     1000    1400    2000     1000    1400    2000     1000    1400    2000     1000    8676    1950      folic acid (FOLVITE) tablet 1 mg Dose: 1 mg Freq: Daily Route: PO Start: 02/09/16 1000   Admin Instructions:  when / if taking POs.   0910     1000     1000     1000     1000     1000     1000      ipratropium-albuterol (DUONEB) 0.5-2.5 (3)  MG/3ML nebulizer solution 3 mL Dose: 3 mL Freq: 3 times daily Route: NEBULIZATION Start: 02/09/16 0800   0816    1400    2100     0800    1400    2100     0800    1400    2100     0800    1400    2100     0800    1400    2100     0800    1400    2100     0800    1400    2100      LORazepam (ATIVAN) tablet 1 mg Dose: 1 mg Freq: 2 times daily Route: PO Start: 02/11/16 2200 End: 02/12/16 2159   2200     1000    2159-D/C'd         Followed by LORazepam (ATIVAN) tablet 1 mg Dose: 1 mg Freq: Daily Route: PO Start: 02/13/16 1000 End: 02/14/16 0959     1000     0959-D/C'd       multivitamin with minerals tablet 1 tablet Dose: 1 tablet Freq: Daily Route: PO Start: 02/09/16 1000   Admin Instructions:  when / if taking POs.   0910     1000     1000     1000     1000     1000     1000      thiamine (VITAMIN B-1) tablet 100 mg Dose: 100 mg Freq: Daily Route: PO Start: 02/09/16 1000    0910     1000     1000     1000     1000     1000     1000      traZODone (DESYREL) tablet 200 mg Dose: 200 mg Freq: Daily at bedtime Route: PO Indications of Use: AGGRESSIVE BEHAVIOR,ALCOHOL WITHDRAWAL SYNDROME,ALCOHOLISM,INSOMNIA,MAJOR DEPRESSIVE DISORDER Start: 02/09/16 2200   2200     2200     2200     2200     2200     2200     2200      warfarin (COUMADIN) tablet 10 mg Dose: 10 mg Freq: One time only - 1800 Route: PO Start: 02/11/16 1800 End: 02/12/16 1759   1800     1759-D/C'd         Warfarin - Pharmacist Dosing Inpatient Freq: Daily-1800 Route: XX Start: 02/09/16 1800   Admin Instructions:  If warfarin (COUMADIN) dosing has not been addressed today, contact the pharmacy to issue orders.   1800     1800     1800     1800     1800     1800     1800      Medications 02/11/16 02/12/16 02/13/16 02/14/16 02/15/16 02/16/16 02/17/16    Continuous Meds Sorted by Name for [place], [place] as of 02/11/16 1404    Previous Inpatient Admission/Date/Reason:  [facility] for depression.   Emotional Health/Current Symptoms  Suicide/Self Harm: Suicidal Ideation (ex. "I can't take anymore, I wish I could disappear") Suicide Attempt in Past (date/description):  Patient reports having suicidal ideations in the past.   Other Harmful Behavior (ex. homicidal ideation) (describe):  n/a   Psychotic/Dissociative Symptoms  Psychotic/Dissociative Symptoms: None Reported Other Psychotic/Dissociative Symptoms: n/a   Attention/Behavioral Symptoms  Attention/Behavioral Symptoms: Within Normal Limits Other Attention/Behavioral Symptoms:    Cognitive Impairment  Cognitive Impairment:  Orientation - Place, Orientation - Self, Orientation - Situation,  Orientation - Time Other Cognitive Impairment:  n/a   Mood and Adjustment  Mood and Adjustment:  Flat, Depression   Stress, Anxiety, Trauma, Any Recent Loss/Stressor  Stress, Anxiety,  Trauma, Any Recent Loss/Stressor: Anxiety Anxiety (frequency):  n/a  Phobia (specify):  n/a  Compulsive Behavior (specify): n/a  Obsessive Behavior (specify):  n/a  Other Stress, Anxiety, Trauma, Any Recent Loss/Stressor:  Patient is worried about his medical condition and inability to pay for his medication at this time.    Substance Abuse/Use  Substance Abuse/Use: History of Substance Use SBIRT Completed (please refer for detailed history): N/A Self-reported Substance Use (last use and frequency):  Yes   Urinary Drug Screen Completed: Yes Alcohol Level:  310   Environment/Housing/Living Arrangement  Environmental/Housing/Living Arrangement: Homeless Who is in the Home:  n/a  Emergency Contact:  n/a   Financial  Financial: Medicaid   Patient's Strengths and Goals  Patient's Strengths and Goals (patient's own words): "I want to go to inpatient for help."   Clinical Social Worker's Interpretive Summary  Clinical Social Workers Interpretive Summary: LCSWA met with patient at bedside for full assessment. Patient was alert and oriented x4 and agreeable to assessment. Patient explained that his his heavy drinking and SI are due to his inability to get his disability and obtain medicaid. He is homeless and lives under a bridge. He does not have family that lives in [place] and has not seen his brother and sister for for thirty years. Patient expressed he is voluntary at this time to seek inpatient for behavioral health.    Disposition  Disposition: Inpatient Referral Made [facility] [place])

## 2016-02-12 ENCOUNTER — Encounter (HOSPITAL_COMMUNITY): Payer: Self-pay

## 2016-02-12 ENCOUNTER — Inpatient Hospital Stay (HOSPITAL_COMMUNITY)
Admission: AD | Admit: 2016-02-12 | Discharge: 2016-02-25 | DRG: 885 | Disposition: A | Discharge status: Rehabilitation Facility | Payer: No Typology Code available for payment source | Source: Intra-hospital | Attending: Psychiatry | Admitting: Psychiatry

## 2016-02-12 DIAGNOSIS — Z809 Family history of malignant neoplasm, unspecified: Secondary | ICD-10-CM | POA: Diagnosis not present

## 2016-02-12 DIAGNOSIS — Z79899 Other long term (current) drug therapy: Secondary | ICD-10-CM

## 2016-02-12 DIAGNOSIS — F17210 Nicotine dependence, cigarettes, uncomplicated: Secondary | ICD-10-CM | POA: Diagnosis present

## 2016-02-12 DIAGNOSIS — I82402 Acute embolism and thrombosis of unspecified deep veins of left lower extremity: Secondary | ICD-10-CM | POA: Diagnosis not present

## 2016-02-12 DIAGNOSIS — F1020 Alcohol dependence, uncomplicated: Secondary | ICD-10-CM | POA: Insufficient documentation

## 2016-02-12 DIAGNOSIS — G4700 Insomnia, unspecified: Secondary | ICD-10-CM | POA: Diagnosis present

## 2016-02-12 DIAGNOSIS — I2510 Atherosclerotic heart disease of native coronary artery without angina pectoris: Secondary | ICD-10-CM | POA: Diagnosis present

## 2016-02-12 DIAGNOSIS — Z86718 Personal history of other venous thrombosis and embolism: Secondary | ICD-10-CM

## 2016-02-12 DIAGNOSIS — R7612 Nonspecific reaction to cell mediated immunity measurement of gamma interferon antigen response without active tuberculosis: Secondary | ICD-10-CM

## 2016-02-12 DIAGNOSIS — Z590 Homelessness: Secondary | ICD-10-CM

## 2016-02-12 DIAGNOSIS — F411 Generalized anxiety disorder: Secondary | ICD-10-CM | POA: Diagnosis present

## 2016-02-12 DIAGNOSIS — D6862 Lupus anticoagulant syndrome: Secondary | ICD-10-CM | POA: Diagnosis present

## 2016-02-12 DIAGNOSIS — F332 Major depressive disorder, recurrent severe without psychotic features: Secondary | ICD-10-CM | POA: Diagnosis not present

## 2016-02-12 DIAGNOSIS — Z9114 Patient's other noncompliance with medication regimen: Secondary | ICD-10-CM

## 2016-02-12 DIAGNOSIS — F1024 Alcohol dependence with alcohol-induced mood disorder: Secondary | ICD-10-CM | POA: Diagnosis not present

## 2016-02-12 DIAGNOSIS — F10239 Alcohol dependence with withdrawal, unspecified: Secondary | ICD-10-CM | POA: Diagnosis present

## 2016-02-12 DIAGNOSIS — Z7901 Long term (current) use of anticoagulants: Secondary | ICD-10-CM

## 2016-02-12 DIAGNOSIS — I82512 Chronic embolism and thrombosis of left femoral vein: Secondary | ICD-10-CM | POA: Diagnosis not present

## 2016-02-12 DIAGNOSIS — Z86711 Personal history of pulmonary embolism: Secondary | ICD-10-CM

## 2016-02-12 DIAGNOSIS — R45851 Suicidal ideations: Secondary | ICD-10-CM | POA: Diagnosis not present

## 2016-02-12 LAB — CBC
HCT: 36.6 % — ABNORMAL LOW (ref 39.0–52.0)
Hemoglobin: 12.5 g/dL — ABNORMAL LOW (ref 13.0–17.0)
MCH: 33.6 pg (ref 26.0–34.0)
MCHC: 34.2 g/dL (ref 30.0–36.0)
MCV: 98.4 fL (ref 78.0–100.0)
PLATELETS: 97 10*3/uL — AB (ref 150–400)
RBC: 3.72 MIL/uL — ABNORMAL LOW (ref 4.22–5.81)
RDW: 13.7 % (ref 11.5–15.5)
WBC: 6.3 10*3/uL (ref 4.0–10.5)

## 2016-02-12 LAB — PROTIME-INR
INR: 0.98 (ref 0.00–1.49)
Prothrombin Time: 13.2 seconds (ref 11.6–15.2)

## 2016-02-12 MED ORDER — LORAZEPAM 1 MG PO TABS: 1.0000 mg | ORAL_TABLET | Freq: Three times a day (TID) | ORAL | Status: DC

## 2016-02-12 MED ORDER — NICOTINE 21 MG/24HR TD PT24
21.0000 mg | MEDICATED_PATCH | Freq: Every day | TRANSDERMAL | Status: DC
  Administered 2016-02-13 – 2016-02-25 (×12): 21 mg via TRANSDERMAL
  Filled 2016-02-12 (×7): qty 1
  Filled 2016-02-12: qty 14
  Filled 2016-02-12 (×6): qty 1

## 2016-02-12 MED ORDER — LORAZEPAM 1 MG PO TABS: 1.0000 mg | ORAL_TABLET | Freq: Two times a day (BID) | ORAL | Status: DC

## 2016-02-12 MED ORDER — LORAZEPAM 1 MG PO TABS
1.0000 mg | ORAL_TABLET | Freq: Four times a day (QID) | ORAL | Status: AC | PRN
  Administered 2016-02-13 – 2016-02-15 (×3): 1 mg via ORAL
  Filled 2016-02-12 (×3): qty 1

## 2016-02-12 MED ORDER — LORAZEPAM 1 MG PO TABS: 1.0000 mg | ORAL_TABLET | Freq: Every day | ORAL | Status: DC

## 2016-02-12 MED ORDER — MAGNESIUM HYDROXIDE 400 MG/5ML PO SUSP: 30.0000 mL | Freq: Every day | ORAL | Status: DC | PRN

## 2016-02-12 MED ORDER — ADULT MULTIVITAMIN W/MINERALS CH
1.0000 | ORAL_TABLET | Freq: Every day | ORAL | Status: DC
  Administered 2016-02-12 – 2016-02-25 (×14): 1 via ORAL
  Filled 2016-02-12 (×17): qty 1

## 2016-02-12 MED ORDER — TRAZODONE HCL 100 MG PO TABS
100.0000 mg | ORAL_TABLET | Freq: Every day | ORAL | Status: DC
  Administered 2016-02-12 – 2016-02-14 (×3): 100 mg via ORAL
  Filled 2016-02-12 (×5): qty 1

## 2016-02-12 MED ORDER — CARVEDILOL 3.125 MG PO TABS
6.2500 mg | ORAL_TABLET | Freq: Two times a day (BID) | ORAL | Status: DC
  Filled 2016-02-12 (×2): qty 2

## 2016-02-12 MED ORDER — ONDANSETRON 4 MG PO TBDP: 4.0000 mg | ORAL_TABLET | Freq: Four times a day (QID) | ORAL | Status: AC | PRN

## 2016-02-12 MED ORDER — LORAZEPAM 1 MG PO TABS: 1.0000 mg | ORAL_TABLET | Freq: Four times a day (QID) | ORAL | Status: DC

## 2016-02-12 MED ORDER — LOPERAMIDE HCL 2 MG PO CAPS: 2.0000 mg | ORAL_CAPSULE | ORAL | Status: AC | PRN

## 2016-02-12 MED ORDER — ALUM & MAG HYDROXIDE-SIMETH 200-200-20 MG/5ML PO SUSP: 30.0000 mL | ORAL | Status: DC | PRN

## 2016-02-12 MED ORDER — HYDROXYZINE HCL 25 MG PO TABS
25.0000 mg | ORAL_TABLET | Freq: Four times a day (QID) | ORAL | Status: AC | PRN
  Administered 2016-02-13 (×2): 25 mg via ORAL
  Filled 2016-02-12 (×2): qty 1

## 2016-02-12 MED ORDER — CARVEDILOL 6.25 MG PO TABS
6.2500 mg | ORAL_TABLET | Freq: Two times a day (BID) | ORAL | Status: DC
Start: 2016-02-13 — End: 2016-02-25
  Administered 2016-02-13 – 2016-02-25 (×25): 6.25 mg via ORAL
  Filled 2016-02-12: qty 1
  Filled 2016-02-12: qty 28
  Filled 2016-02-12 (×3): qty 1
  Filled 2016-02-12: qty 28
  Filled 2016-02-12 (×4): qty 1
  Filled 2016-02-12: qty 2
  Filled 2016-02-12 (×2): qty 1
  Filled 2016-02-12 (×3): qty 2
  Filled 2016-02-12 (×6): qty 1
  Filled 2016-02-12 (×2): qty 2
  Filled 2016-02-12 (×3): qty 1
  Filled 2016-02-12: qty 2
  Filled 2016-02-12: qty 1

## 2016-02-12 MED ORDER — ENOXAPARIN SODIUM 150 MG/ML ~~LOC~~ SOLN
150.0000 mg | SUBCUTANEOUS | Status: DC
  Administered 2016-02-13 – 2016-02-14 (×2): 150 mg via SUBCUTANEOUS
  Filled 2016-02-12 (×3): qty 1

## 2016-02-12 MED ORDER — WARFARIN SODIUM 5 MG PO TABS
15.0000 mg | ORAL_TABLET | Freq: Once | ORAL | Status: AC
  Administered 2016-02-12: 15 mg via ORAL
  Filled 2016-02-12: qty 3

## 2016-02-12 MED ORDER — FOLIC ACID 1 MG PO TABS
1.0000 mg | ORAL_TABLET | Freq: Every day | ORAL | Status: DC
  Administered 2016-02-13 – 2016-02-25 (×13): 1 mg via ORAL
  Filled 2016-02-12 (×10): qty 1
  Filled 2016-02-12: qty 14
  Filled 2016-02-12 (×4): qty 1

## 2016-02-12 MED ORDER — WARFARIN SODIUM 2.5 MG PO TABS: 12.5000 mg | ORAL_TABLET | Freq: Once | ORAL | Status: DC

## 2016-02-12 MED ORDER — LORAZEPAM 1 MG PO TABS: 1.0000 mg | ORAL_TABLET | Freq: Four times a day (QID) | ORAL | Status: DC | PRN

## 2016-02-12 MED ORDER — VITAMIN B-1 100 MG PO TABS
100.0000 mg | ORAL_TABLET | Freq: Every day | ORAL | Status: DC
  Administered 2016-02-13 – 2016-02-25 (×13): 100 mg via ORAL
  Filled 2016-02-12 (×7): qty 1
  Filled 2016-02-12: qty 14
  Filled 2016-02-12 (×6): qty 1

## 2016-02-12 NOTE — Progress Notes (Signed)
PROGRESS NOTE                                                                                                                                                                                                             Patient Demographics:    [name], is a 50 y.o. male, DOB - 1966-01-15, [id number]  Admit date - 02/08/2016   Admitting Physician Etta Quill, DO  Outpatient Primary MD for the patient is No primary care provider on file.  LOS - 3  Chief Complaint  Patient presents with  . Leg Pain  . Back Pain  . Shortness of Breath       Brief Narrative   [name] is a 50 y.o. male with medical history significant of CAD, PE, DVT, tobacco and EtOH abuse, HBV, depression. Patient presents to the ED with c/o SOB ongoing for the past month. Also has gradual-onset, worsening, LLE pain, swelling, redness that began "a couple of months ago". Per EMS o2 initially 85% on room air, improved with Esko. He has an IVC in place due to h/o DVT. 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 He is not compliant with his xeralto due to financial issues, started on Warfarin with lovenox bridge.  ED Course: EtOH > 300, patient states his is also suicidal, initially concern for hypotension so he was given an IVF bolus; however, when a proper sized BP cuff was fitted to the patient he wasn't hypotensive. Initially concern for PNA on CXR; however, CTA PE protocol was unremarkable. Admitting for obs given new oxygen requirement of unclear etiology.     Subjective:    [name] today has, No headache, No chest pain, No abdominal pain - No Nausea, No new weakness tingling or numbness, No Cough - SOB.    Assessment  & Plan :     1.Left Leg DVT Chronic - ++ swelling, was non complaint with [facility] due to financial issues, has IVC filter, CTA Chest negative, large blood clot in the left leg, plan now to DC on warfarin, bridging with  lovenox.  2. Alcohol abuse - counseled, CIWA & scheduled Librium.   3.Depression - Suicidal in the ER - Psych called, and the new sitter and place him in inpatient psych facility per psych, for now continue on Trazadone.    Code Status :  Full  Family Communication  :  None  Disposition Plan  :  He is now medically stable, on warfarin with lovenox bridge. Awaiting transfer to psych inpatient facility when bed found.  Consults  :  IR, Psych  Procedures  :    CTA chest - No PE  Venous US legs - Right: No evidence of DVT or superficial thrombosis. Baker's cyst noted in the popliteal fossa measuring 4.24 cm x 1.44 cm. 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 No evidence of superficial thrombosis or Baker's cyst  DVT Prophylaxis  :  - Heparin -    Lab Results  Component Value Date   PLT 97* 02/12/2016    Inpatient Medications  Scheduled Meds: . carvedilol  6.25 mg Oral BID WC  . enoxaparin (LOVENOX) injection  150 mg Subcutaneous Q24H  . feeding supplement  1 Container Oral TID BM  . folic acid  1 mg Oral Daily  . ipratropium-albuterol  3 mL Nebulization TID  . LORazepam  1 mg Oral BID   Followed by  . [START ON 02/13/2016] LORazepam  1 mg Oral Daily  . multivitamin with minerals  1 tablet Oral Daily  . thiamine  100 mg Oral Daily  . traZODone  200 mg Oral QHS  . warfarin  15 mg Oral Once  . Warfarin - Pharmacist Dosing Inpatient   Does not apply q1800   Continuous Infusions:   PRN Meds:.hydrOXYzine, ibuprofen, loperamide, ondansetron  Antibiotics  :    Anti-infectives    Start     Dose/Rate Route Frequency Ordered Stop   02/08/16 2300  cefTRIAXone (ROCEPHIN) 1 g in dextrose 5 % 50 mL IVPB     1 g 100 mL/hr over 30 Minutes Intravenous  Once 02/08/16 2257 02/09/16 0208   02/08/16 2300  azithromycin (ZITHROMAX) 500 mg in dextrose 5 % 250 mL IVPB     500 mg 250 mL/hr over 60 Minutes Intravenous  Once 02/08/16 2257  02/09/16 0134         Objective:   Filed Vitals:   02/11/16 1501 02/11/16 2115 02/12/16 0616 02/12/16 0757  BP: 159/96 164/98 162/93   Pulse: 65 60 66 66  Temp: 98.1 F (36.7 C) 98.6 F (37 C) 98.5 F (36.9 C)   TempSrc: Oral Oral Oral   Resp: 18 17 18 16   Height:      Weight:      SpO2: 97% 96% 94% 97%    Wt Readings from Last 3 Encounters:  02/09/16 97.886 kg (215 lb 12.8 oz)  08/26/15 94.5 kg (208 lb 5.4 oz)  07/05/15 95.255 kg (210 lb)     Intake/Output Summary (Last 24 hours) at 02/12/16 0839 Last data filed at 02/11/16 2300  Gross per 24 hour  Intake   1440 ml  Output      0 ml  Net   1440 ml     Physical Exam  Awake Alert, Oriented X 3, No new F.N deficits, Normal affect [name].AT,PERRAL Supple Neck,No JVD, No cervical lymphadenopathy appriciated.  Symmetrical Chest wall movement, Good air movement bilaterally, CTAB RRR,No Gallops,Rubs or new Murmurs, No Parasternal Heave +ve B.Sounds, Abd Soft, No tenderness, No organomegaly appriciated, No rebound - guarding or rigidity. No Cyanosis, Clubbing or edema, No new Rash or bruise, L leg swollen      Data Review:    CBC  Recent Labs Lab 02/08/16 2200 02/10/16 0459 02/11/16 0521 02/12/16 [id number]  WBC 5.5 6.8 5.1 6.3  HGB 14.3 11.9* 12.2* 12.5*  HCT 43.2 36.0* 36.6* 36.6*  PLT 140* 98* 103* 97*  MCV 100.5* 99.2 100.0 98.4  MCH 33.3 32.8 33.3 33.6  MCHC 33.1 33.1 33.3 34.2  RDW 14.0 13.9 13.9 13.7  LYMPHSABS 1.6  --   --   --   MONOABS 0.6  --   --   --   EOSABS 0.1  --   --   --   BASOSABS 0.0  --   --   --     Chemistries   Recent Labs Lab 02/08/16 2200 02/09/16 1443  NA 142 138  K 4.0 3.9  CL 109 107  CO2 24 23  GLUCOSE 98 119*  BUN <5* <5*  CREATININE 0.79 0.86  CALCIUM 8.6* 7.8*  AST 73* 78*  ALT 47 46  ALKPHOS 79 73  BILITOT 0.2* 0.5   ------------------------------------------------------------------------------------------------------------------ No results for input(s):  CHOL, HDL, LDLCALC, TRIG, CHOLHDL, LDLDIRECT in the last 72 hours.  Lab Results  Component Value Date   HGBA1C 5.2 09/18/2014   ------------------------------------------------------------------------------------------------------------------ No results for input(s): TSH, T4TOTAL, T3FREE, THYROIDAB in the last 72 hours.  Invalid input(s): FREET3 ------------------------------------------------------------------------------------------------------------------ No results for input(s): VITAMINB12, FOLATE, FERRITIN, TIBC, IRON, RETICCTPCT in the last 72 hours.  Coagulation profile  Recent Labs Lab 02/08/16 2200 02/10/16 0459 02/11/16 0521 02/12/16 0513  INR 0.96 1.06 1.15 0.98    No results for input(s): DDIMER in the last 72 hours.  Cardiac Enzymes No results for input(s): CKMB, TROPONINI, MYOGLOBIN in the last 168 hours.  Invalid input(s): CK ------------------------------------------------------------------------------------------------------------------ No results found for: BNP  Micro Results Recent Results (from the past 240 hour(s))  Blood Culture (routine x 2)     Status: None (Preliminary result)   Collection Time: 02/09/16 12:13 AM  Result Value Ref Range Status   Specimen Description BLOOD RIGHT ANTECUBITAL  Final   Special Requests BOTTLES DRAWN AEROBIC AND ANAEROBIC 7ML EA  Final   Culture   Final    NO GROWTH 2 DAYS Performed at [facility]    Report Status PENDING  Incomplete  Blood Culture (routine x 2)     Status: None (Preliminary result)   Collection Time: 02/09/16 12:15 AM  Result Value Ref Range Status   Specimen Description BLOOD LEFT ANTECUBITAL  Final   Special Requests BOTTLES DRAWN AEROBIC AND ANAEROBIC 5ML EA  Final   Culture   Final    NO GROWTH 2 DAYS Performed at [facility]    Report Status PENDING  Incomplete  Urine culture     Status: Abnormal   Collection Time: 02/09/16  5:53 AM  Result Value Ref Range Status    Specimen Description URINE, CLEAN CATCH  Final   Special Requests NONE  Final   Culture (A)  Final    <10,000 COLONIES/mL INSIGNIFICANT GROWTH Performed at [facility]    Report Status 02/10/2016 FINAL  Final    Radiology Reports Dg Chest 2 View  02/08/2016  CLINICAL DATA:  Shortness of Breath EXAM: CHEST  2 VIEW COMPARISON:  Chest radiograph and chest CT August 24, 2015 FINDINGS: There is patchy infiltrate in the left base. There is a calcified granuloma in the superior segment left lower lobe. The lungs elsewhere are clear. Heart size and pulmonary vascularity are normal. No adenopathy. No bone lesions. IMPRESSION: Patchy infiltrate left base, most likely pneumonia. Calcified granuloma superior segment left lower lobe. Lungs elsewhere clear. Stable cardiac  silhouette. Followup PA and lateral chest radiographs recommended in 3-4 weeks following trial of antibiotic therapy to ensure resolution and exclude underlying malignancy. Electronically Signed   By: Lowella Grip III M.D.   On: 02/08/2016 21:11   Ct Angio Chest Pe W/cm &/or Wo Cm  02/08/2016  CLINICAL DATA:  Acute onset of shortness of breath and left leg pain. Personal history of DVTs. Decreased O2 saturation. Initial encounter. EXAM: CT ANGIOGRAPHY CHEST WITH CONTRAST TECHNIQUE: Multidetector CT imaging of the chest was performed using the standard protocol during bolus administration of intravenous contrast. Multiplanar CT image reconstructions and MIPs were obtained to evaluate the vascular anatomy. The study was repeated due to limitations in the timing of the first contrast bolus. CONTRAST:  175 mL of Isovue 370 IV contrast COMPARISON:  Chest radiograph performed earlier today at 9:00 p.m. FINDINGS: There is no evidence of central pulmonary embolus. Evaluation for pulmonary embolus is suboptimal due to limitations in the timing of the contrast bolus. Minimal right-sided atelectasis and scarring are noted. A large calcified  granuloma is noted at the superior aspect of the left lower lobe. There is no evidence of significant focal consolidation, pleural effusion or pneumothorax. No masses are identified; no abnormal focal contrast enhancement is seen. The mediastinum is unremarkable appearance. No mediastinal lymphadenopathy is seen. No pericardial effusion is identified. The great vessels are grossly unremarkable in appearance. No axillary lymphadenopathy is seen. The thyroid gland is unremarkable in appearance. The visualized portions of the liver and spleen are unremarkable. The visualized portions of the pancreas, stomach, adrenal glands and kidneys are within normal limits. No acute osseous abnormalities are seen. Review of the MIP images confirms the above findings. IMPRESSION: 1. No evidence of central pulmonary embolus. Evaluation for pulmonary embolus is suboptimal due to limitations in the timing of the contrast bolus. 2. Minimal right-sided atelectasis and scarring noted. Electronically Signed   By: Garald Balding M.D.   On: 02/08/2016 23:54    Time Spent in minutes  22   Magdelyn Roebuck Marry Guan M.D on 02/12/2016 at 8:39 AM  Between 7am to 7pm - Pager - [phone]  After 7pm go to [email or web address] - password [facility]  [facility] -  Office  [phone]

## 2016-02-12 NOTE — Progress Notes (Signed)
D: Pt denies SI/HI.Patient states he is having AVH. Pt is pleasant and cooperative. Pt goal for today is to get adjusted to unit. A: Pt was offered support and encouragement. Pt was given scheduled medications. Pt was encourage to attend groups. Q 15 minute checks were done for safety.  R: Pt is taking medication. Pt has no complaints.Pt receptive to treatment and safety maintained on unit.

## 2016-02-12 NOTE — Progress Notes (Signed)
Pt ambulated in the hall independently without difficulty.  No complaints noted nor observed symptoms of difficulty.  Steady gait, no SOB.

## 2016-02-12 NOTE — Care Management Note (Signed)
Case Management Note  Patient Details  Name: Damilola Laperriere MRN: [id number] Date of Birth: [birth year]  Subjective/Objective:  Noted update for d/c, medically stable for d/c to IP pPsych-CSW following.                  Action/Plan:d/c IP Psych.   Expected Discharge Date:                  Expected Discharge Plan:  [facility]  In-House Referral:  Clinical Social Work  Discharge planning Services  CM Consult  Post Acute Care Choice:    Choice offered to:     DME Arranged:    DME Agency:     HH Arranged:    [place] Agency:     Status of Service:  Completed, signed off  Medicare Important Message Given:    Date Medicare IM Given:    Medicare IM give by:    Date Additional Medicare IM Given:    Additional Medicare Important Message give by:     If discussed at [place] of Stay Meetings, dates discussed:    Additional Comments:  Dessa Phi, RN 02/12/2016, 11:41 AM
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
## 2016-02-12 NOTE — Tx Team (Signed)
Initial Interdisciplinary Treatment Plan   PATIENT STRESSORS: Financial difficulties Health problems Substance abuse   PATIENT STRENGTHS: Curator fund of knowledge   PROBLEM LIST: Problem List/Patient Goals Date to be addressed Date deferred Reason deferred Estimated date of resolution  Depression 02/12/16     Suicidal ideation 02/12/16     Substance abuse 02/12/16     "Figure out what I'm going to do with my life, I have nothing to live for" 02/12/16     "Need outside support system" 02/12/16                              DISCHARGE CRITERIA:  Improved stabilization in mood, thinking, and/or behavior Motivation to continue treatment in a less acute level of care Verbal commitment to aftercare and medication compliance Withdrawal symptoms are absent or subacute and managed without 24-hour nursing intervention  PRELIMINARY DISCHARGE PLAN: Outpatient therapy Medication management  PATIENT/FAMIILY INVOLVEMENT: This treatment plan has been presented to and reviewed with the patient, [name].  The patient and family have been given the opportunity to ask questions and make suggestions.  [name] 02/12/2016, 9:05 PM

## 2016-02-12 NOTE — Progress Notes (Signed)
Pt did not attend evening NA group. Pt was asleep.

## 2016-02-12 NOTE — Progress Notes (Signed)
ANTICOAGULATION CONSULT NOTE - Follow Up Consult  Pharmacy Consult for Warfarin, Lovenox Indication: DVT  No Known Allergies  Patient Measurements: Height: 6' (182.9 cm) Weight: 215 lb 12.8 oz (97.886 kg) IBW/kg (Calculated) : 77.6  Vital Signs: Temp: 98.5 F (36.9 C) (06/07 0616) Temp Source: Oral (06/07 0616) BP: 162/93 mmHg (06/07 0616) Pulse Rate: 66 (06/07 0616)  Labs:  Recent Labs  02/09/16 1443  02/10/16 0459 02/10/16 1355 02/11/16 0521 02/12/16 0513  HGB  --   < > 11.9*  --  12.2* 12.5*  HCT  --   --  36.0*  --  36.6* 36.6*  PLT  --   --  98*  --  103* 97*  LABPROT  --   --  13.5  --  14.4 13.2  INR  --   --  1.06  --  1.15 0.98  HEPARINUNFRC 0.41  < > 0.49 0.36 0.38  --   CREATININE 0.86  --   --   --   --   --   < > = values in this interval not displayed.  Estimated Creatinine Clearance: 124.6 mL/min (by C-G formula based on Cr of 0.86).   Medications:  Scheduled:  . carvedilol  6.25 mg Oral BID WC  . enoxaparin (LOVENOX) injection  150 mg Subcutaneous Q24H  . feeding supplement  1 Container Oral TID BM  . folic acid  1 mg Oral Daily  . ipratropium-albuterol  3 mL Nebulization TID  . LORazepam  1 mg Oral BID   Followed by  . [START ON 02/13/2016] LORazepam  1 mg Oral Daily  . multivitamin with minerals  1 tablet Oral Daily  . thiamine  100 mg Oral Daily  . traZODone  200 mg Oral QHS  . Warfarin - Pharmacist Dosing Inpatient   Does not apply q1800   Assessment: 39 yoM admitted on 6/4 with leg pain, SOB, alcohol abuse, and suicidal ideation.  PMH includes chronic left leg DVT, swelling, and history of lupus anticoagulant, and IVC filter placement.  He was originally placed on Xarelto but was non compliant due to financial issues. Per Med Rec pt has not had medication recently and this is supported by labs upon admission. He is not a candidate for thrombolysis.  Pharmacy has been consulted for warfarin dosing and Lovenox bridge.  Today,  02/12/2016:  INR 0.98, remains subtherapeutic and decreased despite 3 warfarin doses.  CBC: Hgb remains stable at 12.5, Plt low/stable at 97  No bleeding or complications reported  Albumin 3.2, AST elevated, ALT, alk phos, total protein WNL  SCr 0.86, CrCl > 100 ml/min  Diet: eating 100% of meals  Drug-drug interactions: azithromycin may increase INR (dose given 6/3)  Warfarin education completed on 02/10/16  Goal of Therapy:  INR 2-3 Heparin level 0.3-0.7 units/ml Monitor platelets by anticoagulation protocol: Yes   Plan:   Warfarin 15 mg PO x 1 dose  Continue Lovenox 150 mg (1.5 mg/kg) Q 24 hr until INR >/= 2 for > 24 hrs.  Daily INR, and CBC  Monitor for signs and symptoms of bleeding.  Gretta Arab PharmD, BCPS Pager [phone] 02/12/2016 7:35 AM

## 2016-02-12 NOTE — Progress Notes (Signed)
Disposition: [facility] accepted referral  LCSWA met with patient about Disposition to [facility]. Patient signed consent for voluntary Admission. [facility] called for transport. No other needs, LCSWA signing off at this time.   Kathrin Greathouse, Latanya Presser, MSW Clinical Social Worker 5E and Psychiatric Service Line [phone] 02/12/2016  3:28 PM

## 2016-02-13 DIAGNOSIS — I82512 Chronic embolism and thrombosis of left femoral vein: Secondary | ICD-10-CM

## 2016-02-13 DIAGNOSIS — F1024 Alcohol dependence with alcohol-induced mood disorder: Secondary | ICD-10-CM

## 2016-02-13 DIAGNOSIS — F332 Major depressive disorder, recurrent severe without psychotic features: Principal | ICD-10-CM

## 2016-02-13 LAB — PROTIME-INR
INR: 1.06 (ref 0.00–1.49)
Prothrombin Time: 14 seconds (ref 11.6–15.2)

## 2016-02-13 LAB — CRYOGLOBULIN

## 2016-02-13 MED ORDER — NITROGLYCERIN 0.4 MG SL SUBL
0.4000 mg | SUBLINGUAL_TABLET | SUBLINGUAL | Status: DC | PRN
  Administered 2016-02-13: 0.4 mg via SUBLINGUAL

## 2016-02-13 MED ORDER — WARFARIN SODIUM 10 MG PO TABS
20.0000 mg | ORAL_TABLET | Freq: Once | ORAL | Status: AC
  Administered 2016-02-13: 20 mg via ORAL
  Filled 2016-02-13: qty 2

## 2016-02-13 MED ORDER — WARFARIN SODIUM 10 MG PO TABS
20.0000 mg | ORAL_TABLET | Freq: Once | ORAL | Status: DC
  Filled 2016-02-13: qty 2

## 2016-02-13 MED ORDER — NITROGLYCERIN 0.4 MG SL SUBL
SUBLINGUAL_TABLET | SUBLINGUAL | Status: AC
  Administered 2016-02-13: 09:00:00
  Filled 2016-02-13: qty 1

## 2016-02-13 MED ORDER — ACETAMINOPHEN 500 MG PO TABS
500.0000 mg | ORAL_TABLET | Freq: Four times a day (QID) | ORAL | Status: DC | PRN
  Administered 2016-02-13 – 2016-02-25 (×24): 500 mg via ORAL
  Filled 2016-02-13 (×24): qty 1

## 2016-02-13 MED ORDER — CITALOPRAM HYDROBROMIDE 10 MG PO TABS
10.0000 mg | ORAL_TABLET | Freq: Every day | ORAL | Status: DC
  Administered 2016-02-14 – 2016-02-17 (×4): 10 mg via ORAL
  Filled 2016-02-13 (×5): qty 1

## 2016-02-13 MED ORDER — ACETAMINOPHEN 500 MG PO TABS
ORAL_TABLET | ORAL | Status: AC
  Administered 2016-02-13: 500 mg
  Filled 2016-02-13: qty 1

## 2016-02-13 MED ORDER — WARFARIN - PHARMACIST DOSING INPATIENT
Freq: Every day | Status: DC
  Filled 2016-02-13 (×9): qty 1

## 2016-02-13 MED ORDER — CITALOPRAM HYDROBROMIDE 10 MG PO TABS: 10.0000 mg | ORAL_TABLET | Freq: Every day | ORAL | Status: DC

## 2016-02-13 NOTE — Consult Note (Signed)
[facility] Initial Consultation Note   Patient Name: [name]    [id number] PCP: No primary care provider on file.   DOB: Sep 13, 1965  DOA: 02/12/2016 DOS: the patient was seen and examined on 02/13/2016   Referring physician: Dr Parke Poisson Reason for consult: Long-term anticoagulation  Assessment/Plan 1. 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 Status post IVC filter placed back in 2014. Negative CTA for pulmonary embolism. IR felt that the patient does not need thrombectomy. Patient noncompliant at his baseline as he does not have money for medication. Patient was prescribed Xarelto prior to this admission, it was changed to Coumadin with Lovenox bridging, at present the question is whether the patient will remain compliant with Coumadin as well as INR follow-up. Patient mentions his primary issue will be transportation.  I discussed with hematology who feels that the patient should have indefinite anticoagulation lifelong. Case management consulted to assess financial assistance for Xarelto versus Apixaban versus Lovenox versus warfarin. Also request case management to assess feasibility of home health RN for INR checkup which will be difficult in a homeless patient versus transportation arrangement for INR checkup as well as physician visits. Requesting case management to arrange for [facility] wellness clinic follow-up on discharge. Although patient has been provided this information in the past and still has not followed up with them.  Recommend to continue Lovenox and warfarin currently until seen by case management, as per hematology the best option would be to take Xarelto due to its efficacy.  2. Suicidal ideation. Continue current therapy for behavioral health M.D. Patient has history of recurrent suicidal ideation and it's difficult to use chronic anticoagulation in such patient. Consulting case management for further assistance for discharge  planning.  3. Alcohol withdrawal. Management per [facility]  Family Communication: no family was present at bedside, at the time of interview. Primary team communication: Discussed with Dr. Parke Poisson  Thank you very much for involving [place] in care of your patient.  We will continue to follow the patient, electronically.   HPI: [name] is a 50 y.o. male with Past medical history of left iliac artery stenosis, chronic left femoral DVT, positive lupus anticoagulant, suicidal ideation with depression, alcohol abuse, coronary artery disease. Patient is homeless. The patient was recently hospitalized at [facility] for alcohol abuse as well as withdrawal with the complaints of left lower extremity pain and swelling and redness that has been present a few months ago. Patient's CT chest was negative for pulmonary embolism. Interventional radiology felt that the patient does not need any thrombolysis and the patient was discharged to inpatient psychiatric unit for continuation of alcohol rehabilitation. psychiatry has question patient's compliance with INR follow-up and requested to consult. At the time of my evaluation the patient denied any complaints of chest pain, shortness of breath, nausea, vomiting, abdominal pain. No active bleeding. Left leg swelling appears to be improving. No other changes in his medications reported.  Review of Systems: as mentioned in the history of present illness.  A Comprehensive review of the other systems is negative.  Past Medical History  Diagnosis Date  . Coronary artery disease   . Degenerative joint disease of spine   . Pulmonary embolism ([place]) 06/2010  . Degenerative joint disease   . Lupus anticoagulant disorder ([place]) 02/02/2012  . ETOH abuse   . DVT (deep venous thrombosis) ([place])   . Hepatitis B   . Depression   . Collagen vascular disease ([place])    Past  Surgical History  Procedure Laterality Date  . Left elbow surgery    . Tonsillectomy      Social History:  reports that he has been smoking Cigarettes.  He has a 60 pack-year smoking history. He has never used smokeless tobacco. He reports that he drinks about 14.4 oz of alcohol per week. He reports that he does not use illicit drugs.  No Known Allergies  Family History  Problem Relation Age of Onset  . Cancer Mother     Ovarian    Prior to Admission medications   Medication Sig Start Date End Date Taking? Authorizing Provider  hydrOXYzine (ATARAX/VISTARIL) 25 MG tablet Take 25 mg by mouth 3 (three) times daily as needed for anxiety.    Yes Historical Provider, MD  traZODone (DESYREL) 100 MG tablet Take 200 mg by mouth at bedtime. Reported on 02/08/2016   Yes Historical Provider, MD  acamprosate (CAMPRAL) 333 MG tablet Take 2 tablets (666 mg total) by mouth 3 (three) times daily with meals. For alcoholism Patient not taking: Reported on 05/15/2015 02/15/15   Shuvon B Rankin, NP  carvedilol (COREG) 6.25 MG tablet Take 1 tablet (6.25 mg total) by mouth 2 (two) times daily with a meal. 02/11/16   Thurnell Lose, MD  citalopram (CELEXA) 20 MG tablet Take 1 tablet (20 mg total) by mouth daily. For depression Patient not taking: Reported on 07/05/2015 02/15/15   Shuvon B Rankin, NP  enoxaparin (LOVENOX) 150 MG/ML injection Inject 1 mL (150 mg total) into the skin daily. 02/11/16   Thurnell Lose, MD  folic acid (FOLVITE) 1 MG tablet Take 1 tablet (1 mg total) by mouth daily. 02/11/16   Thurnell Lose, MD  thiamine 100 MG tablet Take 1 tablet (100 mg total) by mouth daily. 02/11/16   Thurnell Lose, MD  warfarin (COUMADIN) 5 MG tablet Take 1 tablet (5 mg total) by mouth daily. 02/11/16   Thurnell Lose, MD    Physical Exam: Filed Vitals:   02/12/16 2003 02/13/16 0626 02/13/16 0627 02/13/16 0830  BP: 157/93 128/83 98/73 121/85  Pulse: 75 95 102 78  Temp:  98.2 F (36.8 C)    TempSrc:  Oral    Resp:  16  20  Height:      Weight:      SpO2:        General: Alert, Awake and  Oriented to Time, Place and Person. Appear in no distress Eyes: PERRL ENT: Oral Mucosa clear moist. Neck: no JVD Cardiovascular: S1 and S2 Present, no Murmur, Peripheral Pulses Present Respiratory: Bilateral Air entry equal and Decreased,  Clear to Auscultation, no Crackles, no wheezes Abdomen: Bowel Sound present, Soft and no tenderness Skin: no Rash Extremities: no Pedal edema, no calf tenderness Neurologic: Grossly no focal neuro deficit.  Labs:  CBC:  Recent Labs Lab 02/08/16 2200 02/10/16 0459 02/11/16 0521 02/12/16 0513  WBC 5.5 6.8 5.1 6.3  NEUTROABS 3.2  --   --   --   HGB 14.3 11.9* 12.2* 12.5*  HCT 43.2 36.0* 36.6* 36.6*  MCV 100.5* 99.2 100.0 98.4  PLT 140* 98* 103* 97*   Basic Metabolic Panel:  Recent Labs Lab 02/08/16 2200 02/09/16 1443  NA 142 138  K 4.0 3.9  CL 109 107  CO2 24 23  GLUCOSE 98 119*  BUN <5* <5*  CREATININE 0.79 0.86  CALCIUM 8.6* 7.8*   Liver Function Tests:  Recent Labs Lab 02/08/16 2200 02/09/16 1443  AST 73*  78*  ALT 47 46  ALKPHOS 79 73  BILITOT 0.2* 0.5  PROT 7.7 6.8  ALBUMIN 3.7 3.2*   No results for input(s): LIPASE, AMYLASE in the last 168 hours. No results for input(s): AMMONIA in the last 168 hours.  Cardiac Enzymes: No results for input(s): CKTOTAL, CKMB, CKMBINDEX, TROPONINI in the last 168 hours. No results for input(s): PROBNP in the last 8760 hours.  CBG:  Recent Labs Lab 02/08/16 2149  GLUCAP 103*   Author: Berle Mull, MD [facility] Pager: [phone] 02/13/2016 5:49 PM    If 7PM-7AM, please contact night-coverage [email or web address] Password [email or web address]

## 2016-02-13 NOTE — Progress Notes (Signed)
Pt attended karaoke group.  
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
## 2016-02-13 NOTE — Progress Notes (Signed)
ANTICOAGULATION CONSULT NOTE - Follow Up Consult  Pharmacy Consult for Warfarin, Lovenox Indication: DVT  No Known Allergies  Patient Measurements: Height: 6\' 2"  (188 cm) Weight: 210 lb (95.255 kg) IBW/kg (Calculated) : 82.2  Vital Signs: Temp: 98.2 F (36.8 C) (06/08 0626) Temp Source: Oral (06/08 0626) BP: 98/73 mmHg (06/08 0627) Pulse Rate: 102 (06/08 0627)  Labs:  Recent Labs  02/10/16 1355 02/11/16 0521 02/12/16 0513 02/13/16 0635  HGB  --  12.2* 12.5*  --   HCT  --  36.6* 36.6*  --   PLT  --  103* 97*  --   LABPROT  --  14.4 13.2 14.0  INR  --  1.15 0.98 1.06  HEPARINUNFRC 0.36 0.38  --   --    Estimated Creatinine Clearance: 119.5 mL/min (by C-G formula based on Cr of 0.86).  Medications:  Scheduled:  . carvedilol  6.25 mg Oral BID WC  . enoxaparin (LOVENOX) injection  150 mg Subcutaneous Q24H  . folic acid  1 mg Oral Daily  . multivitamin with minerals  1 tablet Oral Daily  . nicotine  21 mg Transdermal Q0600  . thiamine  100 mg Oral Daily  . traZODone  100 mg Oral QHS  . warfarin  20 mg Oral Once  . Warfarin - Pharmacist Dosing Inpatient   Does not apply q1800   Assessment: 56 yoM admitted on 6/4 with leg pain, SOB, alcohol abuse, and suicidal ideation.  PMH includes chronic left leg DVT, swelling, and history of lupus anticoagulant, and IVC filter placement.  He was originally placed on Xarelto but was non compliant due to financial issues. Per Med Rec pt has not had medication recently and this is supported by labs upon admission. He is not a candidate for thrombolysis.  Pharmacy has been consulted for warfarin dosing and Lovenox bridge.  Today, 02/13/2016:  INR 1.08, remains subtherapeutic and decreased despite Warfarin 10,10,10,15mg  doses  CBC: Hgb remains stable at 12.5, Plt low/stable at 97  No bleeding or complications reported  Diet: eating 100% of meals  Warfarin education completed on 02/10/16  Goal of Therapy:  INR 2-3 Heparin level  0.3-0.7 units/ml Monitor platelets by anticoagulation protocol: Yes   Plan:   Warfarin 20 mg PO x 1 dose  Continue Lovenox 150 mg (1.5 mg/kg) Q 24 hr until INR >/= 2 for > 24 hrs.  Daily INR  Monitor for signs and symptoms of bleeding  INR not responding to large doses of Warfarin, but appears patient cannot afford alternate oral anti-coagulant  Minda Ditto PharmD Pager [phone] 02/13/2016, 8:20 AM
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
## 2016-02-13 NOTE — Progress Notes (Signed)
D). Patient C/O chest pain of 4/10, in his left chest, of a "heaviness".  P). VS taken and  NP notified of CP. Nitro. Ordered. Two doses given (see MAR) with relief after second dose. Head ache resulting from nitro reported, and tylenol given at 10am with reduction reported. Emotional support and encouragement offered. Education provided on medication, indications and side effect. R). Continue to maintain safety on the unit. Continue to take medications as prescribed.

## 2016-02-13 NOTE — BHH Group Notes (Signed)
[place] LCSW Group Therapy 02/13/2016  1:15 pm   Type of Therapy: Group Therapy Participation Level: Minimal  Participation Quality: Attentive  Affect: Depressed and Flat  Cognitive: Alert and Oriented  Insight: Developing/Improving and Engaged  Engagement in Therapy: Developing/Improving and Engaged  Modes of Intervention: Clarification, Confrontation, Discussion, Education, Exploration, Limit-setting, Orientation, Problem-solving, Rapport Building, Art therapist, Socialization and Support  Summary of Progress/Problems: The topic for group was balance in life. Today's group focused on defining balance in one's own words, identifying things that can knock one off balance, and exploring healthy ways to maintain balance in life. Group members were asked to provide an example of a time when they felt off balance, describe how they handled that situation,and process healthier ways to regain balance in the future. Group members were asked to share the most important tool for maintaining balance that they learned while at [facility] and how they plan to apply this method after discharge. Patient was observed listening during discussion but did not participate despite CSW encouragement.   Tilden Fossa, MSW, [place] Clinical Social Worker [facility] [phone]

## 2016-02-13 NOTE — Plan of Care (Signed)
Problem: Medication: Goal: Compliance with prescribed medication regimen will improve Outcome: Progressing Patient took all scheduled medications appropriately and was able to ask appropriately for PRNs.

## 2016-02-13 NOTE — BHH Group Notes (Signed)
The focus of this group is to educate the patient on the purpose and policies of crisis stabilization and provide a format to answer questions about their admission.  The group details unit policies and expectations of patients while admitted.  Patient had appropriate insight and appropriate engagement.  Today patient will work on 3 goals for discharge.Patient attended 0900 nurse education orientation group this morning.  Patient actively participated and had appropriate affect.  Patient was alert.     
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
## 2016-02-14 DIAGNOSIS — I82402 Acute embolism and thrombosis of unspecified deep veins of left lower extremity: Secondary | ICD-10-CM

## 2016-02-14 LAB — CBC
HEMATOCRIT: 44.7 % (ref 39.0–52.0)
Hemoglobin: 14.8 g/dL (ref 13.0–17.0)
MCH: 33.7 pg (ref 26.0–34.0)
MCHC: 33.1 g/dL (ref 30.0–36.0)
MCV: 101.8 fL — AB (ref 78.0–100.0)
Platelets: 145 10*3/uL — ABNORMAL LOW (ref 150–400)
RBC: 4.39 MIL/uL (ref 4.22–5.81)
RDW: 14 % (ref 11.5–15.5)
WBC: 5.7 10*3/uL (ref 4.0–10.5)

## 2016-02-14 LAB — PROTIME-INR
INR: 0.98 (ref 0.00–1.49)
Prothrombin Time: 13.2 seconds (ref 11.6–15.2)

## 2016-02-14 LAB — CULTURE, BLOOD (ROUTINE X 2)
CULTURE: NO GROWTH
Culture: NO GROWTH

## 2016-02-14 MED ORDER — WARFARIN SODIUM 10 MG PO TABS
20.0000 mg | ORAL_TABLET | Freq: Once | ORAL | Status: AC
Start: 2016-02-14 — End: 2016-02-14
  Administered 2016-02-14: 20 mg via ORAL
  Filled 2016-02-14: qty 2

## 2016-02-14 MED ORDER — RIVAROXABAN 20 MG PO TABS
20.0000 mg | ORAL_TABLET | Freq: Every day | ORAL | Status: DC
  Administered 2016-02-15 – 2016-02-25 (×11): 20 mg via ORAL
  Filled 2016-02-14 (×4): qty 1
  Filled 2016-02-14: qty 14
  Filled 2016-02-14 (×7): qty 1

## 2016-02-14 NOTE — Progress Notes (Signed)
[facility] unable to work with patient at this time as their caseload is full for the month. [facility] recommendations include [facility] PATH referral.   Tilden Fossa, [place] Worker [facility] [phone]

## 2016-02-14 NOTE — Tx Team (Signed)
Interdisciplinary Treatment Plan Update (Adult) Date: 02/14/2016   Time Reviewed: 9:30 AM  Progress in Treatment: Attending groups: Yes Participating in groups: No Taking medication as prescribed: Yes Tolerating medication: Yes Family/Significant other contact made: No, patient has denies collateral contact Patient understands diagnosis: Yes Discussing patient identified problems/goals with staff: Yes Medical problems stabilized or resolved: Yes Denies suicidal/homicidal ideation: Yes Issues/concerns per patient self-inventory: Yes Other:  New problem(s) identified: N/A  Discharge Plan or Barriers: Homeless, no income or social supports. Does not feel safe staying in a shelter and not eligible for local residential treatment centers due to acute medical issues. Patient will likely return to living in the woods, he is open to an ACTT referral.    Reason for Continuation of Hospitalization:  Depression Anxiety Medication Stabilization   Comments: N/A  Estimated length of stay: 2-3 days   [name] is a 50yo male admitted for treatment of alcohol abuse, depression, anxiety, and SI. He has multiple medical issues including: DVT, blood clots, Lupus, Coronary Artery Disease, degenerative joint disease, Hepatitis B, Collagen Vascular Disease - Medicaid terminated in 2016 and he cannot afford his medications. He has been denied disability, is unemployed, is homeless in the woods for past 12 years, & has had no contact with family for 30 years. History of suicide attempts and homicidal ideation.Drinks alcohol all day every day.No supports, has not seen anyone in his family in 23 years. Has been hospitalized 5 times since 2016 for similar complaints, history of outpatient treatment noncompliance as he faces barriers such as lack of stable housing, support, income, or transportation. Willing to be referred to [facility] and/or [facility], but feels hopeless without Medicaid.The patient would  benefit from safety monitoring, medication evaluation, psychoeducation, group therapy, and discharge planning to link with ongoing resources.   Review of initial/current patient goals per problem list:  1. Goal(s): Patient will participate in aftercare plan   Met: Yes   Target date: 3-5 days post admission date   As evidenced by: Patient will participate within aftercare plan AEB aftercare provider and housing plan at discharge being identified.  6/9: Patient will likely return to living in the woods, he is open to an ACTT referral.    2. Goal (s): Patient will exhibit decreased depressive symptoms and suicidal ideations.   Met: No   Target date: 3-5 days post admission date   As evidenced by: Patient will utilize self rating of depression at 3 or below and demonstrate decreased signs of depression or be deemed stable for discharge by MD.  6/9: Goal progressing. Patient admitted with high depression level.   4. Goal(s): Patient will demonstrate decreased signs of withdrawal due to substance abuse   Met: No   Target date: 3-5 days post admission date   As evidenced by: Patient will produce a CIWA/COWS score of 0, have stable vitals signs, and no symptoms of withdrawal  6/8: CIWA of 6 with tremor, anxiety, auditory and tactile disturbances.   Attendees: Patient:    Family:    Physician: Dr. Parke Poisson; Dr. Shea Evans 02/14/2016 9:30 AM  Nursing: Leanne Lovely, Grayland Ormond, Marcella Dubs, RN 02/14/2016 9:30 AM  Clinical Social Worker: Erasmo Downer Aakash Hollomon, LCSW 02/14/2016 9:30 AM  Other: Peri Maris, LCSWA 02/14/2016 9:30 AM  Other:  02/14/2016 9:30 AM  Other:  02/14/2016 9:30 AM  Other: Glyn Ade NP 02/14/2016 9:30 AM  Other:    Other:           Scribe for Treatment Team:  Erasmo Downer  [place], [place]

## 2016-02-14 NOTE — Progress Notes (Addendum)
ANTICOAGULATION CONSULT NOTE - Follow Up Consult  Pharmacy Consult for Warfarin, Lovenox transition to Xarelto Indication: DVT  No Known Allergies  Patient Measurements: Height: 6\' 2"  (188 cm) Weight: 210 lb (95.255 kg) IBW/kg (Calculated) : 82.2  Vital Signs: Temp: 98.2 F (36.8 C) (06/09 0607) Temp Source: Oral (06/09 0607) BP: 127/87 mmHg (06/09 0608) Pulse Rate: 99 (06/09 0608)  Labs:  Recent Labs  02/12/16 0513 02/13/16 0635 02/14/16 0613  HGB 12.5*  --  14.8  HCT 36.6*  --  44.7  PLT 97*  --  145*  LABPROT 13.2 14.0 13.2  INR 0.98 1.06 0.98   Estimated Creatinine Clearance: 119.5 mL/min (by C-G formula based on Cr of 0.86).  Medications:  Scheduled:  . carvedilol  6.25 mg Oral BID WC  . citalopram  10 mg Oral Daily  . enoxaparin (LOVENOX) injection  150 mg Subcutaneous Q24H  . folic acid  1 mg Oral Daily  . multivitamin with minerals  1 tablet Oral Daily  . nicotine  21 mg Transdermal Q0600  . thiamine  100 mg Oral Daily  . traZODone  100 mg Oral QHS  . warfarin  20 mg Oral Once  . Warfarin - Pharmacist Dosing Inpatient   Does not apply q1800   Assessment: 53 yoM admitted on 6/4 with leg pain, SOB, alcohol abuse, and suicidal ideation.  PMH includes chronic left leg DVT, swelling, and history of lupus anticoagulant, and IVC filter placement.  He was originally placed on Xarelto but was non compliant due to financial issues. Per Med Rec pt has not had medication recently and this is supported by labs upon admission. He is not a candidate for thrombolysis.  Pharmacy has been consulted for warfarin dosing and Lovenox bridge.  Today, 02/14/2016:  INR 0.98, remains subtherapeutic and decreased despite Warfarin 10,10,10,15, 20 mg doses  MD aware of non-response to Warfarin, case mgt researching options for hopefully continuing NOAC  CBC: Hgb/Hct improved 14.5/44.7, Plt improved 145  No bleeding or complications reported  Diet: eating 100% of  meals  Warfarin education completed on 02/10/16  Goal of Therapy:  INR 2-3 Heparin level 0.3-0.7 units/ml Monitor platelets by anticoagulation protocol: Yes   Plan:   Warfarin 20 mg PO x 1 dose  Continue Lovenox 150 mg (1.5 mg/kg) Q 24 hr until INR >/= 2 for > 24 hrs.  Daily INR  Monitor for signs and symptoms of bleeding  INR not responding to large doses of Warfarin, MD aware of difficulty, continue for now  Minda Ditto PharmD Pager [phone] 02/14/2016, 9:37 AM   1400  Plan switch to Xarelto, discontinue Lovenox and Warfarin Obtain INR in am as follow-up, doubt will see any movement of INR Begin Xarelto 20mg  daily with breakfast in am 6/10  Minda Ditto PharmD Pager [phone] 02/14/2016, 1:53 PM

## 2016-02-14 NOTE — Progress Notes (Signed)
D: Pt endorsed moderate anxiety and depression. Pt also endorsed auditory hallucinations; "I hear people whispering." Pt is a high fall risk. Pt is flat isolative and withdrawn to self. Pt denied SI, HI and pain. , remained calm and cooperative. A: Medications offered as prescribed.  Support, encouragement, and safe environment provided.  15-minute safety checks continue. R: Pt was med compliant.  Pt attended [place] group. Safety checks continue.

## 2016-02-14 NOTE — Plan of Care (Signed)
Problem: Education: Goal: Verbalization of understanding the information provided will improve Outcome: Progressing Nurse discussed depression/coping skills with patient.

## 2016-02-14 NOTE — Progress Notes (Signed)
[facility] PROGRESS NOTE  Patient: [name] [id number]   PCP: No primary care provider on file. DOB: [birth year]   DOA: 02/12/2016   DOS: 02/14/2016    Assessment and plan: I discussed patient's case with hematology on 02/12/2026, as well as pharmacy, case management, social worker as well as primary service psychiatry on 02/14/2016. Difficult situation. Patient is homeless without any significant financial assistance. Patient will not take medication be on Xarelto prescriptions and continues to drink alcohol. As per psychiatry the patient has capacity to make medical decisions.  Warfarin would be ideal for long-term anticoagulation, the patient is requiring very high dose of warfarin at present despite any significant improvement in his INR. Patient is not willing to come to anticoagulation clinic for INR checkup due to lack of transport on a regular basis. Patient is homeless and therefore home health RN would not be feasibility for the patient as well. Patient is refusing to go to shelter. Given this information and patient's noncompliance, warfarin without INR follow-up will not be the safest option for the patient.  As per my discussion with hematology as well as psychiatry, Xarelto would be an option for the patient. Case management will arrange for one month supply for the patient from [facility]. Case and will also arrange for earlier follow-up for the patient in one week for [facility] wellness clinic who can further assist in long-term anticoagulation option for the patient. We will place a consult for pharmacy to transition to [place].  We will sign off at present. Thank you for the consult.  Author: Berle Mull, MD [facility] Pager: [phone] 02/14/2016 11:01 AM   If 7PM-7AM, please contact night-coverage at [email or web address], password [facility]

## 2016-02-14 NOTE — Progress Notes (Signed)
Patient did not attend AA group meeting. 

## 2016-02-14 NOTE — [facility] Group Notes (Signed)
[place] LCSW Group Therapy 02/14/2016 1:15 PM Type of Therapy: Group Therapy Participation Level: Minimal  Participation Quality: Attentive  Affect: Depressed and Flat  Cognitive: Alert and Oriented  Insight: Developing/Improving and Engaged  Engagement in Therapy: Developing/Improving and Engaged  Modes of Intervention: Clarification, Confrontation, Discussion, Education, Exploration, Limit-setting, Orientation, Problem-solving, Rapport Building, Art therapist, Socialization and Support  Summary of Progress/Problems: The topic for today was feelings about relapse. Pt discussed what relapse prevention is to them and identified triggers that they are on the path to relapse. Pt processed their feeling towards relapse and was able to relate to peers. Pt discussed coping skills that can be used for relapse prevention. Patient did not participate in discussion despite CSW encouragement.   Tilden Fossa, MSW, [place] Clinical Social Worker [facility] [phone]

## 2016-02-14 NOTE — Progress Notes (Signed)
D:  Patient's self inventory sheet, patient has fair sleep, sleep medication was not helpful.  Good appetite, low energy level, poor concentration.  Rated depression, hopeless and anxiety 8.  Withdrawals, cravings, chilling, agitation, runny nose, irritability.  Denied SI.  Physical problems, pain, headaches, left leg/ lower back.  Pain medication is not helpful.  Goal is to attend all meetings.  Plans to stay awake.  No discharge plans. A:  Medications administered per MD orders.  Emotional support and encouragement given patient. R:  Denied SI and Hi, contracts for safety.  Denied A/V hallucinations.  Safety maintained with 15 minute checks. \
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
## 2016-02-14 NOTE — Progress Notes (Signed)
Recreation Therapy Notes  Date: 06.09.2017 Time: 9:30am Location: [address] Dayroom   Group Topic: Stress Management  Goal Area(s) Addresses:  Patient will actively participate in stress management techniques presented during session.   Behavioral Response: Did not attend.   Laureen Ochs Christyne Mccain, LRT/CTRS         Kathelyn Gombos L 02/14/2016 2:06 PM

## 2016-02-15 DIAGNOSIS — R45851 Suicidal ideations: Secondary | ICD-10-CM

## 2016-02-15 LAB — PROTIME-INR
INR: 1.52 — ABNORMAL HIGH (ref 0.00–1.49)
PROTHROMBIN TIME: 18.4 s — AB (ref 11.6–15.2)

## 2016-02-15 MED ORDER — TRAZODONE HCL 150 MG PO TABS
150.0000 mg | ORAL_TABLET | Freq: Every day | ORAL | Status: DC
  Administered 2016-02-15 – 2016-02-24 (×10): 150 mg via ORAL
  Filled 2016-02-15 (×8): qty 1
  Filled 2016-02-15: qty 14
  Filled 2016-02-15 (×2): qty 1

## 2016-02-15 NOTE — [facility] Group Notes (Signed)
[facility] Group Notes:  (Clinical Social Work)   07/06/2015     10:00-11:00AM  Summary of Progress/Problems:   In today's process group a decisional balance exercise was used to explore in depth the perceived benefits and costs of alcohol and drugs, as well as the  benefits and costs of replacing these with healthy coping skills.  Patients listed healthy and unhealthy coping techniques, determining with CSW guidance that unhealthy coping techniques work initially, but eventually become harmful.  Motivational Interviewing and the whiteboard were utilized for the exercises.  The patient was late to group but was attentive and made a few contributions to the list on the whiteboard.  Type of Therapy:  Group Therapy - Process   Participation Level:  Minimal  Participation Quality:  Attentive  Affect:  Flat  Cognitive:  Appropriate  Insight:  Improving  Engagement in Therapy:  Improving  Modes of Intervention:  Education, Motivational Interviewing  Selmer Dominion, LCSW 02/15/2016, 10:53 AM
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
## 2016-02-15 NOTE — BHH Group Notes (Signed)
[place] Group Notes:  (Nursing/MHT/Case Management/Adjunct)  Date:  02/15/2016  Time:  0900 am  Type of Therapy:  Psychoeducational Skills  Participation Level:  Active  Participation Quality:  Appropriate and Attentive  Affect:  Anxious  Cognitive:  Alert  Insight:  Appropriate  Engagement in Group:  Engaged  Modes of Intervention:  Support  Summary of Progress/Problems: Patient wasn't sure what he wanted to work on today.  He is concerned about discharge and placement.  Informed him social worker has a list of resources which may help.  Zipporah Plants 02/15/2016, 10:03 AM
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
## 2016-02-15 NOTE — Progress Notes (Signed)
D: Patient concerned about his xarelta and the cost of staying on it.  He continues to have depressive symptoms.  He presents with flat, blunted affect and depressed mood.  He rates his depression, hopelessness and anxiety as a 7.  He is attending groups with some participation.  He is complaining of continuing nightmares that is interfering with his sleep.  He continues to report withdrawal symptoms of diarrhea, agitation, runny nose and irritability.  Patient is on ativan prn.  Explained protocol to patient. A: Continue to monitor medication management and MD orders.  Safety checks completed every 15 minutes per protocol.  Offer support and encouragement as needed. R: Patient receptive to staff; his behavior is appropriate.

## 2016-02-15 NOTE — Progress Notes (Signed)
D: Pt was isolative and withdrawn to room; endorsed moderate anxiety and depression. Pt also endorsed auditory hallucinations; "I still here stuff every now-and-then." Pt is a high fall risk. Pt denied SI, HI and pain. Pt remained calm and cooperative. A: Medications offered as prescribed.  Support, encouragement, and safe environment provided.  15-minute safety checks continue. R: Pt was med compliant.  Pt did not attend AA group. Safety checks continue.

## 2016-02-15 NOTE — Plan of Care (Signed)
Problem: Health Behavior/Discharge Planning: Goal: Compliance with treatment plan for underlying cause of condition will improve Outcome: Progressing Patient is attending groups and participating in his treatment.

## 2016-02-16 DIAGNOSIS — F332 Major depressive disorder, recurrent severe without psychotic features: Secondary | ICD-10-CM | POA: Insufficient documentation

## 2016-02-16 MED ORDER — HYDROXYZINE HCL 25 MG PO TABS
25.0000 mg | ORAL_TABLET | Freq: Four times a day (QID) | ORAL | Status: DC | PRN
  Administered 2016-02-16 – 2016-02-25 (×20): 25 mg via ORAL
  Filled 2016-02-16 (×16): qty 1
  Filled 2016-02-16: qty 20
  Filled 2016-02-16 (×3): qty 1

## 2016-02-16 MED ORDER — HYDROXYZINE HCL 25 MG PO TABS
ORAL_TABLET | ORAL | Status: AC
  Filled 2016-02-16: qty 1

## 2016-02-16 NOTE — BHH Group Notes (Signed)
[facility] Group Notes:  (Clinical Social Work)  02/16/2016  10:00-11:00AM  Summary of Progress/Problems:   The main focus of today's process group was to   1)  define health supports versus unhealthy supports  2)  identify the patient's current unhealthy supports and plan how to handle them  3)  Identify the patient's current healthy supports  An emphasis was placed on the second objective above, as that was of biggest concern to the group as a whole.  Considerable group time ended up focusing on helpful wording to help unhealthy supports understand that what they are doing for/to the patient is actually destructive rather than helpful.  The patient expressed full comprehension of the concepts presented.  The patient stated that all the supports in his life are unhealthy.  He listened in group attentively, but appeared to have a confused affect and did not talk.  Type of Therapy:  Process Group with Motivational Interviewing  Participation Level:  Active  Participation Quality:  Attentive  Affect:  Blunted, Depressed and Appeared confused  Cognitive:  Confused  Insight:  Limited  Engagement in Therapy:  Limited  Modes of Intervention:   Education, Support and Processing, Activity  Selmer Dominion, LCSW 02/16/2016

## 2016-02-16 NOTE — Progress Notes (Signed)
D: Pt was isolative and withdrawn to room; continued to endorse moderate anxiety and depression. Pt also endorsed moderate pain from a headache. Pt at the time of assessment denied AVH. Pt also denied SI or HI. Pt remained a fall risk. Pt also remained calm and cooperative. A: Medications offered as prescribed.  Support, encouragement, and safe environment provided.  15-minute safety checks continue. R: Pt was med compliant.  Pt did not attend wrap-up group. Safety checks continue.

## 2016-02-16 NOTE — Progress Notes (Signed)
D: Patient has been isolative to room, only coming out for group and medications.  Patient has been having ongoing problems with incontinence which has kept him isolative.  He presents with flat, blunted affect; sad and depressed mood.  He rates his depression and anxiety as a 5; hopelessness as a 7.  He continues to complain of slight headache and pain in his left leg and lower back.  He reports his withdrawal symptoms as agitation, chilling and irritability.  He denies any self harm thoughts.  He denies HI/AVH.  Patient is sleeping well.  Patient's goal today is to "get my medication figured out and come up with a discharge plan."   A: Continue to monitor medication management and MD orders.  Safety checks completed every 15 minutes per protocol. Offer support and encouragement as need. R: Patient is receptive to staff; his behavior is appropriate.

## 2016-02-16 NOTE — BHH Group Notes (Signed)
[facility] Group Notes:  (Nursing/MHT/Case Management/Adjunct)  Date:  02/16/2016  Time:  0900 am  Type of Therapy:  Healthy Support Systems  Participation Level:  Minimal  Participation Quality:  Resistant  Affect:  Flat  Cognitive:  Appropriate  Insight:  Lacking  Engagement in Group:  Resistant  Modes of Intervention:  Support  Summary of Progress/Problems: Patient listened during group, however, elected to not participate.  Zipporah Plants 02/16/2016, 9:54 AM

## 2016-02-16 NOTE — Progress Notes (Signed)
Patient did attend the evening speaker AA meeting.  

## 2016-02-16 NOTE — Plan of Care (Signed)
Problem: Coping: Goal: Ability to identify and develop effective coping behavior will improve Outcome: Not Progressing Patient continues to report increased anxiety.
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
## 2016-02-17 MED ORDER — CITALOPRAM HYDROBROMIDE 20 MG PO TABS
20.0000 mg | ORAL_TABLET | Freq: Every day | ORAL | Status: DC
  Administered 2016-02-18 – 2016-02-25 (×8): 20 mg via ORAL
  Filled 2016-02-17 (×2): qty 1
  Filled 2016-02-17: qty 14
  Filled 2016-02-17 (×6): qty 1

## 2016-02-17 MED ORDER — TUBERCULIN PPD 5 UNIT/0.1ML ID SOLN: 5.0000 [IU] | Freq: Once | INTRADERMAL | Status: DC

## 2016-02-17 NOTE — Progress Notes (Signed)
Pt attended spiritual care group on grief and loss facilitated by chaplain Jerene Pitch   Group opened with brief discussion and psycho-social ed around grief and loss in relationships and in relation to self - identifying life patterns, circumstances, changes that cause losses. Established group norm of speaking from own life experience. Group goal of establishing open and affirming space for members to share loss and experience with grief, normalize grief experience and provide psycho social education and grief support.    [name] was present throughout group.  He was lethargic, sleeping off and on.  He did not engage verbally in group.    Coral, Micro

## 2016-02-17 NOTE — Progress Notes (Signed)
Patient ID: [name], male   DOB: [birth year], 50 y.o.   MRN: [id number]  DAR: Pt. Denies SI/HI and A/V Hallucinations. He reports sleep is good, appetite is good, energy level is normal, and concentration is good. He rates depression 5/10, hopelessness 7/10, and anxiety 5/10. He reports his goal today is making a discharge plan. He spoke to the Education officer, museum today which was part of meeting his goal. He is seen in the milieu at times but is mostly seen laying in his bed resting or reading Patient does report pain that is chronic in nature and received PRN Tylenol. Support and encouragement provided to the patient. Scheduled medications administered to patient per physician's orders. Patient is minimal but cooperative with Probation officer. Q15 minute checks are maintained for safety.

## 2016-02-17 NOTE — BHH Suicide Risk Assessment (Signed)
[place] INPATIENT:  Family/Significant Other Suicide Prevention Education  Suicide Prevention Education:  Patient Refusal for Family/Significant Other Suicide Prevention Education: The patient [name] has refused to provide written consent for family/significant other to be provided Family/Significant Other Suicide Prevention Education during admission and/or prior to discharge.  Physician notified.  SPE completed with pt, as pt refused to consent to family contact. SPI pamphlet provided to pt and pt was encouraged to share information with support network, ask questions, and talk about any concerns relating to SPE. Pt denies access to guns/firearms and verbalized understanding of information provided. Mobile Crisis information also provided to pt.   Smart, [name] LCSW 02/17/2016, 4:04 PM

## 2016-02-17 NOTE — BHH Group Notes (Signed)
[facility] LCSW Aftercare Discharge Planning Group Note   02/17/2016 9:58 AM  Participation Quality:  Invited. Pt sleeping in room. DID NOT ATTEND.   Smart, Heliodoro Domagalski LCSW

## 2016-02-17 NOTE — Progress Notes (Signed)
D: Pt has depressed affect and depressed, anxious mood.  Pt reports his day was "all right."  He reports his goal is "to figure out what I'm gonna do about my discharge plan."  Pt reports he discussed aftercare options with his social worker but "I forgot" what they were.  Pt denies SI/HI, denies hallucinations, reports pain from headache of 8/10.  Pt has been visible in milieu interacting with peers and staff appropriately.   A: Introduced self to pt.  Met with pt and offered support and encouragement.  Actively listened to pt.  Medications administered per order.  PRN medication administered for pain and anxiety. R: Pt is compliant with medications.  Pt verbally contracts for safety.  Will continue to monitor and assess.
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
## 2016-02-17 NOTE — Progress Notes (Signed)
D: Pt was isolative and withdrawn to room; endorsed moderate anxiety with mild depression. Pt also endorsed moderate chronic back pain. Pt at the time of assessment denied AVH. Pt also denied SI or HI. Pt remained a fall risk. Pt also remained calm and cooperative. A: Medications offered as prescribed.  Support, encouragement, and safe environment provided.  15-minute safety checks continue. R: Pt was med compliant.  Pt did not attend AA group. Safety checks continue.

## 2016-02-17 NOTE — Progress Notes (Signed)
Recreation Therapy Notes  Date: 06.12.2017 Time: 9:30am Location: 300 Hall Group Room   Group Topic: Stress Management  Goal Area(s) Addresses:  Patient will actively participate in stress management techniques presented during session.   Behavioral Response: Engaged, Attentive   Intervention: Stress management techniques  Activity : Deep Breathing and Progressive Body Relaxation. LRT provided education, instruction and demonstration on practice of Deep Breathing and Progressive Body Relaxation. Patient was asked to participate in technique introduced during session.   Education:  Stress Management, Discharge Planning.   Education Outcome: Acknowledges education  Clinical Observations/Feedback: Patient actively engaged in technique introduced, expressed no concerns and demonstrated ability to practice independently post d/c.   Laureen Ochs Tyran Huser, LRT/CTRS        Damian Buckles L 02/17/2016 10:19 AM

## 2016-02-17 NOTE — Progress Notes (Signed)
Patient ID: Hser Eagar, male   DOB: 1965/12/06, 50 y.o.   MRN: [id number]  Writer was going to administer 5 units of TB injection to patient. When writer was explaining what was going to happen before administration patient stated, "I cannot have it. I was told I have to have a chest xray because I will give a false positive." Writer did not give to patient. Writer called MD Eappen and notified her of patient's response. Order was discontinued.

## 2016-02-18 NOTE — [facility] Group Notes (Signed)
Adult Psychoeducational Group Note  Date:  02/18/2016 Time:  9:51 PM  Group Topic/Focus:  AA Meeting  Participation Level:  Minimal  Participation Quality:  Attentive  Affect:  Appropriate  Cognitive:  Appropriate  Insight: Limited  Engagement in Group:  Limited  Modes of Intervention:  Discussion and Education  Additional Comments:  Pt attended [place] meeting.  Victorino Sparrow A 02/18/2016, 9:51 PM

## 2016-02-18 NOTE — Progress Notes (Signed)
Regarding patient's Xarelto, spoke with Azalee Course D at [facility] has different mechanism than Coumadin and that PT/INR are not affected by Xarelto.  Discussed with Dr Shea Evans, that medication is not to be held.

## 2016-02-18 NOTE — [facility] Group Notes (Signed)
[facility] LCSW Group Therapy  02/18/2016 11:32 AM  Type of Therapy:  Group Therapy  Participation Level:  Active  Participation Quality:  Attentive  Affect:  Appropriate  Cognitive:  Alert and Oriented  Insight:  Improving  Engagement in Therapy:  Improving  Modes of Intervention:  Confrontation, Discussion, Education, Exploration, Problem-solving, Rapport Building, Socialization and Support  Summary of Progress/Problems: [facility] Speaker came to talk about his personal journey with substance abuse and addiction. The pt processed ways by which to relate to the speaker. [place] speaker provided handouts and educational information pertaining to groups and services offered by the [facility]. SPEAKER CALLED OUT FOR TODAY. CSW PROVIDED [employer]LET AND REVIEWED INFORMATION/SUPPORT GROUPS WITH PATIENTS. CSW ALSO PROVIDED PATIENTS WITH [facility]/PATH PROGRAM INFORMATION, SHELTER INFORMATION, AND OTHER RESOURCES FOR AA/NA/NAMI. [place] was receptive to information presented and remained pleasant/calm throughout group. Patient was receptive to [facility] information as well and plans to follow-up with them at discharge.   Smart, Parris Cudworth LCSW 02/18/2016, 11:32 AM

## 2016-02-18 NOTE — [facility] Group Notes (Signed)
[facility] Group Notes:  (Nursing/MHT/Case Management/Adjunct)  Date:  02/18/2016  Time:  4:26 PM  Type of Therapy:  Psychoeducational Skills  Participation Level:  Did Not Attend  Participation Quality:  N/A  Affect:  N/A  Cognitive:  N/A  Insight:  None  Engagement in Group:  None  Modes of Intervention:  Discussion and Education  Summary of Progress/Problems: Patient was invited to group but did not attend.   Gaylan Gerold E 02/18/2016, 4:26 PM

## 2016-02-18 NOTE — Progress Notes (Signed)
D: Pt presents with depressed affect and depressed, anxious mood.  Pt reports he "talked to the social worker more."  Pt reports he will be "working with this program called [facility] and maybe going to [facility]."  Pt denies SI/HI, denies hallucinations, reports back pain of 8/10.  Pt has been visible in milieu interacting with peers and staff appropriately.  Pt attended evening group.   A: Actively listened to pt and offered support and encouragement.  Medications administered per order.  PRN medication administered for anxiety and pain. R: Pt is compliant with medications.  Pt verbally contracts for safety.  Will continue to monitor and assess.

## 2016-02-18 NOTE — Progress Notes (Signed)
CSW met with pt individually this morning to discuss aftercare plan. Pt notified that he will be screened for admission on Tuesday 6/20 at [facility]. He confirmed that he has Lyle in his locker. Patient also agreeable to meeting with [facility] team Izora Gala and Cristie Hem) this morning for assessment. They will continue to follow-up with pt during his stay at the hospital and [place], MSW, LCSW Clinical Social Worker 02/18/2016 11:23 AM

## 2016-02-18 NOTE — Progress Notes (Signed)
Patient ID: [name], male   DOB: 05-20-1966, 50 y.o.   MRN: [id number]  DAR: Pt. Denies SI/HI and A/V Hallucinations. He reports sleep is good, appetite is good, energy level is normal, and concentration is good. He rates depression 5/10, hopelessness 7/10, and anxiety 5/10. He received PRN Vistaril for anxiety to help decrease anxiety. He also reports some agitation, runny nose, and irritability. Patient does continue to report back pain and is receiving PRN Tylenol. Support and encouragement provided to the patient. He remains minimal with Probation officer but cooperative. Mood remains depressed and affect is flat. Scheduled medications administered to patient per physician's orders. He reports his goal today is to work on his energy level. Q15 minute checks are maintained for safety.
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
## 2016-02-18 NOTE — Progress Notes (Signed)
Recreation Therapy Notes  Animal-Assisted Activity (AAA) Program Checklist/Progress Notes Patient Eligibility Criteria Checklist & Daily Group note for Rec Tx Intervention  Date: 06.13.2017 Time: 2:45pm Location: [address]    AAA/T Program Assumption of Risk Form signed by Patient/ or Parent Legal Guardian Yes  Patient is free of allergies or sever asthma Yes  Patient reports no fear of animals Yes  Patient reports no history of cruelty to animals Yes  Patient understands his/her participation is voluntary Yes  Behavioral Response: Did not attend.   Laureen Ochs Blanca Thornton, LRT/CTRS        Millie Shorb L 02/18/2016 3:01 PM

## 2016-02-19 MED ORDER — DICLOFENAC SODIUM 1 % TD GEL
2.0000 g | Freq: Four times a day (QID) | TRANSDERMAL | Status: DC
  Administered 2016-02-19 – 2016-02-24 (×19): 2 g via TOPICAL
  Filled 2016-02-19: qty 100

## 2016-02-19 NOTE — [facility] Group Notes (Signed)
[place] LCSW Group Therapy  02/19/2016 11:27 AM  Type of Therapy:  Group Therapy  Participation Level:  Active  Participation Quality:  Attentive  Affect:  Appropriate  Cognitive:  Alert and Oriented  Insight:  Improving  Engagement in Therapy:  Improving  Modes of Intervention:  Confrontation, Discussion, Education, Exploration, Problem-solving, Rapport Building, Socialization and Support  Summary of Progress/Problems: Today's Topic: Overcoming Obstacles. Patients identified one short term goal and potential obstacles in reaching this goal. Patients processed barriers involved in overcoming these obstacles. Patients identified steps necessary for overcoming these obstacles and explored motivation (internal and external) for facing these difficulties head on. [name] was attentive and engaged during today's processing group. He shared that his biggest obstacle is "being homeless and not having resources." He talked about being assessed by [facility]. "They are bringing me clothes and a backpack tomorrow." [name] continues to show progress in the group setting. "I'm looking forward to going to treatment next week at [facility], [place] LCSW 02/19/2016, 11:27 AM
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
## 2016-02-19 NOTE — Progress Notes (Signed)
Recreation Therapy Notes  Date: 06.14.2017 Time: 9:30am Location: 300 Hall Group Room   Group Topic: Stress Management  Goal Area(s) Addresses:  Patient will actively participate in stress management techniques presented during session.   Behavioral Response: Engaged, Attentive   Intervention: Stress management techniques  Activity : Deep Breathing and Mindfulness. LRT provided education, instruction and demonstration on practice of Deep Breathing and Mindfulness. Patient was asked to participate in technique introduced during session.   Education:  Stress Management, Discharge Planning.   Education Outcome: Acknowledges education  Clinical Observations/Feedback: Patient actively engaged in technique introduced, expressed no concerns and demonstrated ability to practice independently post d/c.   Laureen Ochs Piya Mesch, LRT/CTRS         [place] 02/19/2016 10:18 AM

## 2016-02-19 NOTE — Tx Team (Signed)
Interdisciplinary Treatment Plan Update (Adult) Date: 02/19/2016   Time Reviewed: 9:30 AM  Progress in Treatment: Attending groups: Yes Participating in groups: Minimally when he attends  Taking medication as prescribed: Yes Tolerating medication: Yes Family/Significant other contact made: No, patient has denies collateral contact Patient understands diagnosis: Yes Discussing patient identified problems/goals with staff: Yes Medical problems stabilized or resolved: Yes Denies suicidal/homicidal ideation: Yes Issues/concerns per patient self-inventory: Yes Other:  New problem(s) identified: N/A  Discharge Plan or Barriers: [facility] on Tuesday, 6/20 and plans to follow[facility]up at [facility] for [facility] health services. Pt has been assessed by [facility] program through the [facility] as well.     Reason for Continuation of Hospitalization:  Depression Anxiety Medication Stabilization   Comments: N/A  Estimated length of stay: 4-5 day (possible d/c on Tuesday morning 6/20 for direct admission to [facility]).    [name] is a 50yo male admitted for treatment of alcohol abuse, depression, anxiety, and SI. He has multiple medical issues including: DVT, blood clots, Lupus, Coronary Artery Disease, degenerative joint disease, Hepatitis B, Collagen Vascular Disease - Medicaid terminated in 2016 and he cannot afford his medications. He has been denied disability, is unemployed, [place], & has had no contact with family for 30 years. History of suicide attempts and homicidal ideation.Drinks alcohol all day every day.No supports, has not seen anyone in his family in 65 years. Has been hospitalized 5 times since 2016 for similar complaints, history of outpatient treatment noncompliance as he faces barriers such as lack of stable housing, support, income, or transportation. Willing to be referred to [facility] and/or [facility], but feels  hopeless without Medicaid.The patient would benefit from safety monitoring, medication evaluation, psychoeducation, group therapy, and discharge planning to link with ongoing resources.   Review of initial/current patient goals per problem list:  1. Goal(s): Patient will participate in aftercare plan   Met: Yes   Target date: 3-5 days post admission date   As evidenced by: Patient will participate within aftercare plan AEB aftercare provider and housing plan at discharge being identified.  6/9: Patient will likely return to living [place] the woods, he is open to an ACTT referral.   6/14: Pt plans to go to [place] on Tuesday 6/20 at 8am.    2. Goal (s): Patient will exhibit decreased depressive symptoms and suicidal ideations.   Met: No   Target date: 3-5 days post admission date   As evidenced by: Patient will utilize self rating of depression at 3 or below and demonstrate decreased signs of depression or be deemed stable for discharge by MD.  6/9: Goal progressing. Patient admitted with high depression level.  6/14: Goal progressing. Pt rates depression as 5/10 with depressed mood/flat affect.    3. Goal(s): Patient will demonstrate decreased signs of withdrawal due to substance abuse   Met: Yes   Target date: 3-5 days post admission date   As evidenced by: Patient will produce a CIWA/COWS score of 0, have stable vitals signs, and no symptoms of withdrawal  6/8: CIWA of 6 with tremor, anxiety, auditory and tactile disturbances.  6/14: Pt reports no signs of withdrawals with CIWA of 0.    Attendees: Patient:    Family:    Physician: Dr. Parke Poisson MD 02/19/2016 10:37 AM   Nursing: Chrys Racer; Clarise Cruz RN 02/19/2016 10:37 AM   Clinical Social Worker: Press photographer, LCSW 02/19/2016 10:37 AM   Other: Peri Maris,  Barrie Lyme Drinkard LCSW 02/19/2016 10:37 AM   Other:    Other:    Other: Agustina Caroli NP; Samuel Jester NP 02/19/2016 10:37 AM   Other:    Other:            Scribe for Treatment Team:  Maxie Better, MSW, LCSW Clinical Social Worker 02/19/2016 10:37 AM

## 2016-02-19 NOTE — Progress Notes (Signed)
Patient attended N/A group tonight.  

## 2016-02-19 NOTE — Progress Notes (Signed)
D: Patient report minimal withdrawal symptoms.  He rates his depression and anxiety as a 5; depression as a 7.  He denies SI/HI/AVH.  Patient continues to present with flat, blunted affect.  He is attending groups on the unit.  He is more visible.  His goal today is to "try to think better."  Patient hopes to go to a long term tx such as [facility] or [facility].  He is sleeping well; appetite and concentration good.   A: Continue to monitor medication management and MD orders.  Safety checks completed every 15 minutes per protocol. Offer support and encouragement as needed. R: Patient is receptive to staff; his behavior is appropriate.

## 2016-02-19 NOTE — Plan of Care (Signed)
Problem: Coping: Goal: Ability to interact with others will improve Outcome: Progressing Patient is visible on the unit.  He is interacting with staff and his peers.

## 2016-02-19 NOTE — [facility] Group Notes (Signed)
[facility] LCSW Aftercare Discharge Planning Group Note   02/19/2016 10:31 AM  Participation Quality:  Appropriate   Mood/Affect:  Depressed and Flat  Depression Rating:  5  Anxiety Rating:  5  Thoughts of Suicide:  No Will you contract for safety?   NA  Current AVH:  No  Plan for Discharge/Comments:  Pt reports that he is happy to be going to [facility] on Tuesday for possible admission and met with [facility] team yesterday for assessment. "they are bringing me clothes on Thursday." Pt reports that he has a headache and some leg pain "tylenol isn't really helping." Pt reports fair sleep. Pt depressed/flat during group but brightens when talking with others.   Transportation Means: taxi voucher likely  Supports: [facility] program through [employer]. Few friend in community.   Smart, [place] Perra LCSW

## 2016-02-20 DIAGNOSIS — F1020 Alcohol dependence, uncomplicated: Secondary | ICD-10-CM | POA: Insufficient documentation

## 2016-02-20 NOTE — Progress Notes (Signed)
D: Patient [facility] bed on [facility].  Patient states he has been trying o get some rest.  Patient denies SI/HI and denies AVH.  Patient states he is feeling better today. A: Staff to monitor Q 15 mins for safety.  Encouragement and support offered.  Scheduled medications administered per orders. R: Patient remains safe on the unit.  Patient did not attend group tonight.  Patient visible on the unit for snack and medications tonight.  Patient taking administered medications.
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
## 2016-02-20 NOTE — Progress Notes (Signed)
Adult Psychoeducational Group Note  Date:  02/20/2016 Time:  10:06 PM  Group Topic/Focus:  Wrap-Up Group:   The focus of this group is to help patients review their daily goal of treatment and discuss progress on daily workbooks.  Participation Level:  Did not attend  Participation Quality:  Did not attend  Affect:  Did not attend  Cognitive:  Did not attend  Insight: None  Engagement in Group:  Did not attend  Modes of Intervention:  Did not attend  Additional Comments:  Patient did not attend wrap up group tonight.   Tobby Fawcett L Maalle Starrett 02/20/2016, 10:06 PM

## 2016-02-20 NOTE — BHH Group Notes (Signed)
[place] LCSW Group Therapy  02/20/2016 3:17 PM  Type of Therapy:  Group Therapy  Participation Level:  Active  Participation Quality:  Attentive  Affect:  Appropriate  Cognitive:  Alert and Oriented  Insight:  Improving  Engagement in Therapy:  Improving  Modes of Intervention:  Confrontation, Discussion, Education, Exploration, Problem-solving, Rapport Building, Socialization and Support  Summary of Progress/Problems: [facility] Speaker came to talk about his personal journey with substance abuse and addiction. The pt processed ways by which to relate to the speaker. [place] speaker provided handouts and educational information pertaining to groups and services offered by the [facility].   Smart, Lauranne Beyersdorf LCSW 02/20/2016, 3:17 PM

## 2016-02-20 NOTE — Progress Notes (Signed)
D: Patient remains depressed with flat, blunted affect.  He complains of severe anxiety.  Patient denies any self harm thoughts today.  He is calm and cooperative.  Patient was engaged during group.  He seems to be progressing with his treatment.  He adapted well today to the move from the gym to the unit.  He denies any HI/AVH.  A: Continue to monitor medication management and MD orders.  Safety checks completed every 15 minutes per protocol.  Offer support and encouragement as needed. R; Patient is receptive to staff; his behavior is appropriate.

## 2016-02-21 LAB — QUANTIFERON IN TUBE
QFT TB AG MINUS NIL VALUE: 0.42 [IU]/mL
QUANTIFERON MITOGEN VALUE: 6.49 IU/mL
QUANTIFERON TB AG VALUE: 0.45 [IU]/mL
QUANTIFERON TB GOLD: POSITIVE — AB
Quantiferon Nil Value: 0.03 IU/mL

## 2016-02-21 LAB — QUANTIFERON TB GOLD ASSAY (BLOOD)

## 2016-02-21 NOTE — Progress Notes (Signed)
Patient did attend the evening speaker AA meeting.  

## 2016-02-21 NOTE — Progress Notes (Signed)
Recreation Therapy Notes  Date: 06.16.2017 Time: 9:30am Location: 300 Hall Group Room   Group Topic: Stress Management  Goal Area(s) Addresses:  Patient will actively participate in stress management techniques presented during session.   Behavioral Response: Engaged, Attentive   Intervention: Stress management techniques  Activity : Deep Breathing and Guided Imagery. LRT provided education, instruction and demonstration on practice of  Deep Breathing and Guided Imagery. Patient was asked to participate in technique introduced during session.   Education:  Stress Management, Discharge Planning.   Education Outcome: Acknowledges education  Clinical Observations/Feedback: Patient actively engaged in technique introduced, expressed no concerns and demonstrated ability to practice independently post d/c.   Laureen Ochs Baelynn Schmuhl, LRT/CTRS        Braylin Xu L 02/21/2016 10:20 AM

## 2016-02-21 NOTE — BHH Group Notes (Signed)
[facility] LCSW Aftercare Discharge Planning Group Note   02/21/2016 9:09 AM  Participation Quality: Invited. DID NOT ATTEND. Pt chose to rest in room.   Smart, Haillee Johann LCSW

## 2016-02-21 NOTE — Plan of Care (Signed)
Problem: Medication: Goal: Compliance with prescribed medication regimen will improve Outcome: Progressing Patient has been med compliant.  Problem: Education: Goal: Understanding of discharge needs will improve Outcome: Progressing Patient aware of formulating discharge plan for Tuesday.

## 2016-02-21 NOTE — BHH Group Notes (Signed)
[place] LCSW Group Therapy  02/21/2016 1:28 PM  Type of Therapy:  Group Therapy  Participation Level:  Did Not Attend-pt sleeping in room.   Modes of Intervention:  Confrontation, Discussion, Education, Exploration, Problem-solving, Rapport Building, Socialization and Support  Summary of Progress/Problems: Feelings around Relapse. Group members discussed the meaning of relapse and shared personal stories of relapse, how it affected them and others, and how they perceived themselves during this time. Group members were encouraged to identify triggers, warning signs and coping skills used when facing the possibility of relapse. Social supports were discussed and explored in detail. Post Acute Withdrawal Syndrome (handout provided) was introduced and examined. Pt's were encouraged to ask questions, talk about key points associated with PAWS, and process this information in terms of relapse prevention.   Smart, Demere Dotzler LCSW 02/21/2016, 1:28 PM

## 2016-02-21 NOTE — Progress Notes (Signed)
Patient remains isolative, quiet. Minimal information given on assessment 1:1 however patient is cooperative. He rates his depression and anxiety both at a 7/10, hopelessness at a 7/10. Reports sleep, appetite and concentration as "good" today and energy level is "normal." Continues to struggle with chronic back pain, currently rated at an 8/10. States his goal for today is to get some exercise by going to the gym.   Patient medicated per orders, tylenol given for pain. Also given vistaril prn per his request. Emotional support and reassurance offered. Self inventory reviewed.   Patient reports some relief from vistaril, pain is at a 5/10. He denies SI/HI and remains safe on level III obs.
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
## 2016-02-22 ENCOUNTER — Encounter (HOSPITAL_COMMUNITY): Payer: Self-pay | Admitting: Registered Nurse

## 2016-02-22 NOTE — Progress Notes (Signed)
Patient did attend the evening speaker AA meeting.  

## 2016-02-22 NOTE — Progress Notes (Signed)
D: Pt was isolative and withdrawn to room. Pt endorsed moderate anxiety and depression; states, "You know my anxiety is not going anywhere and you also know my depression goes with my anxiety. Pt also endorsed moderate bank pain. Pt at the time of assessment denied AVH. Pt also denied SI or HI. Pt remained a fall risk. Pt also remained calm and cooperative. A: Medications offered as prescribed.  Support, encouragement, and safe environment provided.  15-minute safety checks continue. R: Pt was med compliant.  Pt did not attend wrap-up group. Safety checks continue.
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
## 2016-02-22 NOTE — [facility] Group Notes (Signed)
Adult Therapy Group Note  Date:  02/22/2016 Time:  10:00 AM  Group Topic/Focus:  Today's group focused on identifying a change each patient is considering, their goals which they would want to accomplish with that change, possible obstacles and how they could respond to those obstacles.  We then talked about how they will know when that change is starting to happen.  Motivational Interviewing was used to high light ambivalence and focus on patients' own reasons to change.  Participation Level:  Active  Participation Quality:  Appropriate, Attentive and Sharing  Affect:  Anxious and Blunted  Cognitive:  Appropriate  Insight: Improving  Engagement in Group:  Developing/Improving  Modes of Intervention:  Exploration and Motivational Interviewing  Additional Comments:  Pt stated he wants to stop using and stop having the addictive behavior that has impacted him negatively for the last 30 years.  He talked more than usual, stating that he has lost everything he ever had over the last 30 years, "literally only have the clothes on my body."  Today he stated he can work on not being sidetracked.  Lysle Dingwall 02/22/2016, 11:18 AM

## 2016-02-22 NOTE — Progress Notes (Signed)
Nursing Progress Note: 7-7p  D- Mood is depressed and withdrawn,,rates anxiety and depression at 5/10. Affect is blunted and appropriate. Pt tends to isolate in his room, reports appetite and sleep are good Pt is able to contract for safety. Goal for today is to attend meetings today's especially A.A  A - Observed pt interacting in group and in the milieu.Support and encouragement offered, safety maintained with q 15 minutes. Pt does suffer from chronic low back and left leg pain .  R-Contracts for safety and continues to follow treatment plan, working on learning new coping skills.

## 2016-02-23 NOTE — [facility] Group Notes (Signed)
[place] Group Notes:  (Nursing/MHT/Case Management/Adjunct)  Date:  02/23/2016  Time:  4:26 PM  Type of Therapy:  Psychoeducational Skills  Participation Level:  Active  Participation Quality:  Appropriate  Affect:  Appropriate  Cognitive:  Appropriate  Insight:  Appropriate  Engagement in Group:  Engaged  Modes of Intervention:  Problem-solving  Summary of Progress/Problems:  Discussed the important of choosing healthy leisure activities. Group encouraged to surround themselves with positive and healthy group/support system when changing to a healthy life style.    Wolfgang Phoenix 02/23/2016, 4:26 PM

## 2016-02-23 NOTE — Plan of Care (Signed)
Problem: Coping: Goal: Ability to use eye contact when communicating with others will improve Outcome: Progressing Pt has maintained appropriate eye contact while communicating with Probation officer.

## 2016-02-23 NOTE — Progress Notes (Signed)
Patient did attend the evening speaker AA meeting.  

## 2016-02-23 NOTE — Progress Notes (Signed)
D: Pt continues to be very flat and depressed on the unit today. Pt has been visible in the milieu and interacting well with peers. Pt provided with medications per providers orders. Pt's labs and vitals were monitored throughout the day. Pt supported emotionally and encouraged to express concerns and questions. Pt educated on medications. Pt reported that his depression was a 4, his hopelessness was a 4, and that his anxiety was a 7. Pt reported being negative SI/HI, no AH/VH noted. A: 15 min checks continued for patient safety. R: Pts safety maintained.

## 2016-02-23 NOTE — Progress Notes (Signed)
D: Pt presents with depressed affect and mood.  He describes his day as "okay."  Pt reports he attended groups today.  When asked what the best part of his day was, pt states "everyday is the same."  Pt denies SI/HI, denies hallucinations, reports back pain of 7/10.  Pt has been visible in milieu interacting with peers and staff appropriately.  Pt attended evening group.   A: Actively listened to pt and offered support and encouragement.  Medication administered per order.  PRN medication administered for pain and anxiety.  Heat packs provided for pain. R: Pt is compliant with medications.  Pt verbally contracts for safety.  Will continue to monitor and assess.

## 2016-02-23 NOTE — Progress Notes (Signed)
D: Pt was in bed in his room upon initial approach.  Pt has depressed affect and mood.  He reports his day was "all right."  Pt states "I went to all the group meetings today."  Pt reports he is going to [facility] on Tuesday.  Pt denies SI/HI, denies hallucinations, reports back pain of 6/10.  Pt has been visible in milieu interacting with peers and staff appropriately.  Pt attended evening group.   A: Introduced self to pt.  Met with pt and offered support and encouragement.  Actively listened to pt.  Medications administered per order.  PRN medication administered for pain and anxiety. R: Pt is compliant with medications.  Pt verbally contracts for safety.  Will continue to monitor and assess.
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
## 2016-02-23 NOTE — BHH Group Notes (Signed)
[facility] LCSW Group Therapy  02/23/2016 10:10 to 11 AM  Type of Therapy:  Group Therapy  Participation Level:  Minimal  Participation Quality:  Hesitant  Affect:  Flat  Cognitive:  Alert  Insight:  Limited  Engagement in Therapy:  Limited  Modes of Intervention:  Clarification, Exploration, Orientation, Problem-solving, Rapport Building, Socialization and Support  Summary of Progress/Problems:  Patient shared during warm up that he was uncertain what a sign of improvement would be for him as "I've been depressed forever." Patient rates his depression at a 10 and agreed that an improvement to a 7 would be helpful. Other's offered encouragement. The main focus of today's process group was to meet the patient where they are and identify the patient's basic belief as to whether he/she has the opportunity to get better. Patient declined further input  Sheilah Pigeon, LCSW  02/23/2016

## 2016-02-24 ENCOUNTER — Ambulatory Visit (HOSPITAL_COMMUNITY)
Admission: RE | Admit: 2016-02-24 | Discharge: 2016-02-24 | Disposition: A | Discharge status: Home / Self Care | Payer: Medicaid Other | Source: Ambulatory Visit | Attending: Psychiatry | Admitting: Psychiatry

## 2016-02-24 DIAGNOSIS — J8410 Pulmonary fibrosis, unspecified: Secondary | ICD-10-CM | POA: Insufficient documentation

## 2016-02-24 DIAGNOSIS — R7612 Nonspecific reaction to cell mediated immunity measurement of gamma interferon antigen response without active tuberculosis: Secondary | ICD-10-CM | POA: Insufficient documentation

## 2016-02-24 DIAGNOSIS — J984 Other disorders of lung: Secondary | ICD-10-CM | POA: Insufficient documentation

## 2016-02-24 MED ORDER — CARVEDILOL 6.25 MG PO TABS: 6.2500 mg | ORAL_TABLET | Freq: Two times a day (BID) | ORAL | Status: DC

## 2016-02-24 MED ORDER — DICLOFENAC SODIUM 1 % TD GEL: 2.0000 g | Freq: Four times a day (QID) | TRANSDERMAL | Status: DC

## 2016-02-24 MED ORDER — HYDROXYZINE HCL 25 MG PO TABS
25.0000 mg | ORAL_TABLET | Freq: Three times a day (TID) | ORAL | Status: DC | PRN
  Filled 2016-02-24: qty 20

## 2016-02-24 MED ORDER — HYDROXYZINE HCL 25 MG PO TABS: 25.0000 mg | ORAL_TABLET | Freq: Three times a day (TID) | ORAL | Status: DC | PRN

## 2016-02-24 MED ORDER — THIAMINE HCL 100 MG PO TABS: 100.0000 mg | ORAL_TABLET | Freq: Every day | ORAL | Status: DC

## 2016-02-24 MED ORDER — TRAZODONE HCL 150 MG PO TABS: 150.0000 mg | ORAL_TABLET | Freq: Every day | ORAL | Status: DC

## 2016-02-24 MED ORDER — CITALOPRAM HYDROBROMIDE 20 MG PO TABS: 20.0000 mg | ORAL_TABLET | Freq: Every day | ORAL | Status: DC

## 2016-02-24 MED ORDER — FOLIC ACID 1 MG PO TABS: 1.0000 mg | ORAL_TABLET | Freq: Every day | ORAL | Status: DC

## 2016-02-24 MED ORDER — RIVAROXABAN 20 MG PO TABS: 20.0000 mg | ORAL_TABLET | Freq: Every day | ORAL | Status: DC

## 2016-02-24 MED ORDER — NICOTINE 21 MG/24HR TD PT24: 21.0000 mg | MEDICATED_PATCH | Freq: Every day | TRANSDERMAL | Status: DC

## 2016-02-24 NOTE — BHH Group Notes (Signed)
Pt attended spiritual care group on grief and loss facilitated by chaplain Jadelynn Boylan   Group opened with brief discussion and psycho-social ed around grief and loss in relationships and in relation to self - identifying life patterns, circumstances, changes that cause losses. Established group norm of speaking from own life experience. Group goal of establishing open and affirming space for members to share loss and experience with grief, normalize grief experience and provide psycho social education and grief support.     

## 2016-02-24 NOTE — BHH Group Notes (Signed)
[place] LCSW Group Therapy 02/24/2016  1:15 pm  Type of Therapy: Group Therapy Participation Level: Minimal  Participation Quality: Attentive  Affect: Appropriate  Cognitive: Alert and Oriented  Insight: Developing/Improving and Engaged  Engagement in Therapy: Developing/Improving and Engaged  Modes of Intervention: Clarification, Confrontation, Discussion, Education, Exploration,  Limit-setting, Orientation, Problem-solving, Rapport Building, Art therapist, Socialization and Support  Summary of Progress/Problems: Pt identified obstacles faced currently and processed barriers involved in overcoming these obstacles. Pt identified steps necessary for overcoming these obstacles and explored motivation (internal and external) for facing these difficulties head on. Pt further identified one area of concern in their lives and chose a goal to focus on for today. Patient was unable to think of any potential obstacles but reports that he would like to work on managing his "frustration" better. He is unsure how he will accomplish this.   Tilden Fossa, LCSW Clinical Social Worker [facility] [phone]

## 2016-02-24 NOTE — Progress Notes (Signed)
  [facility] Adult Case Management Discharge Plan :  Will you be returning to the same living situation after discharge:  No.Pt has screening for possible admission on this date at 8:00AM.  At discharge, do you have transportation home?: Yes,  Taxi voucher in chart. PATIENT MUST DISCHARGE NO LATER THAN 7:20AM ON TUESDAY, 02/25/16 IN ORDER TO MAKE IT TO HIS 8AM SCREENING AT [facility] Do you have the ability to pay for your medications: Yes,  mental health  Release of information consent forms completed and submitted to medical records by CSW.   Patient to Follow up at: Follow-up Information    Follow up with [facility].   Specialty:  Behavioral Health   Why:  Walk-in clinic Monday-Friday between 8 to 10am for assessment for therapy and medication management services.    Contact information:   [place] [phone]       Follow up with [facility] On 02/25/2016.   Why:  Appt for admission on this date at 8:00AM. Please bring photo ID/proof of [place] residence and 14 day supply of medications. (Admission appt scheduled by: Arbie Cookey in Admissions). Thank you.    Contact information:   [address] [place], [place] [address] Phone: [phone] Fax: [phone]      Next level of care provider has access to [place] and Suicide Prevention discussed: Yes,  SPE completed with pt; pt declined to consent to family contact. SPI pamphlet and Mobile Crisis information provided to pt and he was encouraged to share information with support network, ask questions, and talk about any concerns relating to SPE.  Have you used any form of tobacco in the last 30 days? (Cigarettes, Smokeless Tobacco, Cigars, and/or Pipes): Yes  Has patient been referred to the Quitline?: Patient refused referral  Patient has been referred for addiction treatment: Yes  [name], [name] Steedman LCSW 02/24/2016, 10:41 AM
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
## 2016-02-24 NOTE — Tx Team (Signed)
Interdisciplinary Treatment Plan Update (Adult) Date: 02/24/2016   Time Reviewed: 9:30 AM  Progress in Treatment: Attending groups: Yes Participating in groups: Minimally when he attends  Taking medication as prescribed: Yes Tolerating medication: Yes Family/Significant other contact made: No, patient has denies collateral contact. SPE completed with pt.  Patient understands diagnosis: Yes Discussing patient identified problems/goals with staff: Yes Medical problems stabilized or resolved: Yes Denies suicidal/homicidal ideation: Yes Issues/concerns per patient self-inventory: Yes Other:  New problem(s) identified: N/A  Discharge Plan or Barriers: [facility] Tuesday, [facility]/20 and plans to [facility] at [facility] for [facility] health services. Pt has been assessed by PATH program through the [facility] as well.     Reason for Continuation of Hospitalization:  none  Comments: N/A  Estimated length of stay: 1 day (pt scheduled for 7:15AM discharge on Tuesday, 02/24/17 via taxi-voucher in chart).    [name] is a 50yo male admitted for treatment of alcohol abuse, depression, anxiety, and SI. He has multiple medical issues including: DVT, blood clots, Lupus, Coronary Artery Disease, degenerative joint disease, Hepatitis B, Collagen Vascular Disease - Medicaid terminated in 2016 and he cannot afford his medications. He has been denied disability, is unemployed, [place] the woods for past 12 years, & has had no contact with family for 30 years. History of suicide attempts and homicidal ideation.Drinks alcohol all day every day.No supports, has not seen anyone in his family in 61 years. Has been hospitalized 5 times since 2016 for similar complaints, history of outpatient treatment noncompliance as he faces barriers such as lack of stable housing, support, income, or transportation. Willing to be referred to [facility] and/or [facility], but feels hopeless without  Medicaid.The patient would benefit from safety monitoring, medication evaluation, psychoeducation, group therapy, and discharge planning to link with ongoing resources.   Review of initial/current patient goals per problem list:  1. Goal(s): Patient will participate in aftercare plan   Met: Yes   Target date: 3-5 days post admission date   As evidenced by: Patient will participate within aftercare plan AEB aftercare provider and housing plan at discharge being identified.  6/9: Patient will likely return to living in the woods, he is open to an ACTT referral.   6/14: Pt plans to go to [facility] on Tuesday 6/20 at 8am.    2. Goal (s): Patient will exhibit decreased depressive symptoms and suicidal ideations.   Met: Yes   Target date: 3-5 days post admission date   As evidenced by: Patient will utilize self rating of depression at 3 or below and demonstrate decreased signs of depression or be deemed stable for discharge by MD.  6/9: Goal progressing. Patient admitted with high depression level.  6/14: Goal progressing. Pt rates depression as 5/10 with depressed mood/flat affect.   6/19: Goal met. Pt rates depression as 3/10 and presents with pleasant mood/lethargic affect. He denies SI/HI/AVh at this time.    3. Goal(s): Patient will demonstrate decreased signs of withdrawal due to substance abuse   Met: Yes   Target date: 3-5 days post admission date   As evidenced by: Patient will produce a CIWA/COWS score of 0, have stable vitals signs, and no symptoms of withdrawal  6/8: CIWA of 6 with tremor, anxiety, auditory and tactile disturbances.  6/14: Pt reports no signs of withdrawals with CIWA of 0.    Attendees: Patient:    Family:    Physician: Dr. Shea Evans MD 02/24/2016 10:43 AM  Nursing: Verdene Rio RN  02/24/2016 10:43 AM   Clinical Social Worker: Maxie Better, LCSW 02/24/2016 10:43 AM   Other: Eden Emms Drinkard LCSW  02/24/2016 10:43 AM   Other:    Other:    Other: Agustina Caroli NP 02/24/2016 10:43 AM   Other:    Other:           Scribe for Treatment Team:  Maxie Better, MSW, LCSW Clinical Social Worker 02/24/2016 10:43 AM

## 2016-02-24 NOTE — Progress Notes (Signed)
D: Complains of  chronic back pain 7/10, anxiety this shift.  Spends most of free time in room "the chairs here just make my back hurt a lot," observed reading in room during free time. Appropriate with peers and staff. Affect blunted and appropriate, mood "content."  Denies SI, HI, and AVH. A: Given scheduled pain medicine plus PRN Tylenol and Vistaril.  Remains on 15 minute checks. R: Observed asleep on 0015 check.  Tylenol and Vistaril effective.  Remains safe on unit.

## 2016-02-24 NOTE — [facility] Group Notes (Deleted)
[place] LCSW Group Therapy  02/24/2016 12:53 PM  Type of Therapy:  Group Therapy  Participation Level:  Did Not Attend-pt resting in room preparing for hospital trip for chest xray.   Modes of Intervention:  Confrontation, Discussion, Education, Exploration, Problem-solving, Rapport Building, Socialization and Support  Summary of Progress/Problems: Today's Topic: Overcoming Obstacles. Patients identified one short term goal and potential obstacles in reaching this goal. Patients processed barriers involved in overcoming these obstacles. Patients identified steps necessary for overcoming these obstacles and explored motivation (internal and external) for facing these difficulties head on.   Smart, Yosselyn Tax LCSW 02/24/2016, 12:53 PM

## 2016-02-24 NOTE — Progress Notes (Signed)
D: Patient remains isolative to room.  Reviewed AVS/discharge instructions for early discharge to [facility] tomorrow.  He rates his depression and hopelessness as a 3; anxiety as a 5.  Patient attends some groups on the unit.  His goal today is to "get my clothes washed."  He continues to complain of lower leg pain.  He denies any self harm thoughts or HI/AVH.  Patient has medication samples and prescriptions. Patient has a cab voucher to leave by 7:00 am. A: Continue to monitor medication management and MD orders.  Safety checks completed every 15 minutes per protocol.  Offer support and encouragement as needed. R: Patient is receptive to staff; his behavior is appropriate.
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
## 2016-02-24 NOTE — Progress Notes (Signed)
Recreation Therapy Notes  Date: 06.19.2017 Time: 9:30am Location: 300 Hall Group Room   Group Topic: Stress Management  Goal Area(s) Addresses:  Patient will actively participate in stress management techniques presented during session.   Behavioral Response: Engaged, Attentive   Intervention: Stress management techniques  Activity :  Deep Breathing, Progressive Body Relaxation and Progressive Body Scan. LRT provided education, instruction and demonstration on practice of Deep Breathing, Progressive Body Relaxation and Progressive Body Scan. Patient was asked to participate in technique introduced during session.   Education:  Stress Management, Discharge Planning.   Education Outcome: Acknowledges education  Clinical Observations/Feedback: Patient actively engaged in technique introduced, expressed no concerns and demonstrated ability to practice independently post d/c.   Laureen Ochs Narda Fundora, LRT/CTRS         Tomoya Ringwald L 02/24/2016 10:17 AM

## 2016-02-25 NOTE — Progress Notes (Signed)
Pt discharged at this time. Pt denies SI/HI. Pt provided with prescriptions and supply of medications. Pt also provided with taxi voucher to take Clarice to [place]. Pt had all belongings returned to him, and left ambulatory and in no distress.

## 2016-04-22 ENCOUNTER — Inpatient Hospital Stay (HOSPITAL_COMMUNITY)
Admission: EM | Admit: 2016-04-22 | Discharge: 2016-04-27 | DRG: 190 | Disposition: A | Discharge status: Home / Self Care | Payer: Self-pay | Attending: Internal Medicine | Admitting: Internal Medicine

## 2016-04-22 ENCOUNTER — Emergency Department (HOSPITAL_COMMUNITY): Payer: Self-pay

## 2016-04-22 ENCOUNTER — Encounter (HOSPITAL_COMMUNITY): Payer: Self-pay | Admitting: Emergency Medicine

## 2016-04-22 DIAGNOSIS — M479 Spondylosis, unspecified: Secondary | ICD-10-CM | POA: Diagnosis present

## 2016-04-22 DIAGNOSIS — R0602 Shortness of breath: Secondary | ICD-10-CM

## 2016-04-22 DIAGNOSIS — M7989 Other specified soft tissue disorders: Secondary | ICD-10-CM

## 2016-04-22 DIAGNOSIS — Z86718 Personal history of other venous thrombosis and embolism: Secondary | ICD-10-CM

## 2016-04-22 DIAGNOSIS — F10220 Alcohol dependence with intoxication, uncomplicated: Secondary | ICD-10-CM | POA: Diagnosis present

## 2016-04-22 DIAGNOSIS — F10920 Alcohol use, unspecified with intoxication, uncomplicated: Secondary | ICD-10-CM

## 2016-04-22 DIAGNOSIS — F329 Major depressive disorder, single episode, unspecified: Secondary | ICD-10-CM | POA: Diagnosis present

## 2016-04-22 DIAGNOSIS — Z5900 Homelessness unspecified: Secondary | ICD-10-CM

## 2016-04-22 DIAGNOSIS — Z720 Tobacco use: Secondary | ICD-10-CM | POA: Diagnosis present

## 2016-04-22 DIAGNOSIS — I87009 Postthrombotic syndrome without complications of unspecified extremity: Secondary | ICD-10-CM | POA: Diagnosis present

## 2016-04-22 DIAGNOSIS — R0902 Hypoxemia: Secondary | ICD-10-CM

## 2016-04-22 DIAGNOSIS — F10129 Alcohol abuse with intoxication, unspecified: Secondary | ICD-10-CM | POA: Diagnosis present

## 2016-04-22 DIAGNOSIS — J9601 Acute respiratory failure with hypoxia: Secondary | ICD-10-CM | POA: Diagnosis present

## 2016-04-22 DIAGNOSIS — J441 Chronic obstructive pulmonary disease with (acute) exacerbation: Principal | ICD-10-CM | POA: Diagnosis present

## 2016-04-22 DIAGNOSIS — I82409 Acute embolism and thrombosis of unspecified deep veins of unspecified lower extremity: Secondary | ICD-10-CM | POA: Diagnosis present

## 2016-04-22 DIAGNOSIS — F17210 Nicotine dependence, cigarettes, uncomplicated: Secondary | ICD-10-CM | POA: Diagnosis present

## 2016-04-22 DIAGNOSIS — D6862 Lupus anticoagulant syndrome: Secondary | ICD-10-CM | POA: Diagnosis present

## 2016-04-22 DIAGNOSIS — J449 Chronic obstructive pulmonary disease, unspecified: Secondary | ICD-10-CM | POA: Diagnosis present

## 2016-04-22 DIAGNOSIS — F1010 Alcohol abuse, uncomplicated: Secondary | ICD-10-CM | POA: Diagnosis present

## 2016-04-22 DIAGNOSIS — Z9119 Patient's noncompliance with other medical treatment and regimen: Secondary | ICD-10-CM

## 2016-04-22 DIAGNOSIS — F332 Major depressive disorder, recurrent severe without psychotic features: Secondary | ICD-10-CM | POA: Diagnosis present

## 2016-04-22 DIAGNOSIS — R197 Diarrhea, unspecified: Secondary | ICD-10-CM | POA: Diagnosis present

## 2016-04-22 DIAGNOSIS — R45851 Suicidal ideations: Secondary | ICD-10-CM | POA: Diagnosis present

## 2016-04-22 DIAGNOSIS — Z7901 Long term (current) use of anticoagulants: Secondary | ICD-10-CM

## 2016-04-22 DIAGNOSIS — I2510 Atherosclerotic heart disease of native coronary artery without angina pectoris: Secondary | ICD-10-CM | POA: Diagnosis present

## 2016-04-22 HISTORY — DX: Chronic obstructive pulmonary disease, unspecified: J44.9

## 2016-04-22 LAB — COMPREHENSIVE METABOLIC PANEL
ALBUMIN: 3.3 g/dL — AB (ref 3.5–5.0)
ALK PHOS: 89 U/L (ref 38–126)
ALT: 66 U/L — ABNORMAL HIGH (ref 17–63)
ANION GAP: 11 (ref 5–15)
AST: 85 U/L — AB (ref 15–41)
BILIRUBIN TOTAL: 0.3 mg/dL (ref 0.3–1.2)
CALCIUM: 8.1 mg/dL — AB (ref 8.9–10.3)
CO2: 19 mmol/L — AB (ref 22–32)
Chloride: 111 mmol/L (ref 101–111)
Creatinine, Ser: 0.65 mg/dL (ref 0.61–1.24)
GFR calc Af Amer: >60 mL/min (ref >60)
GFR calc non Af Amer: >60 mL/min (ref >60)
GLUCOSE: 116 mg/dL — AB (ref 65–99)
POTASSIUM: 4.1 mmol/L (ref 3.5–5.1)
SODIUM: 141 mmol/L (ref 135–145)
Total Protein: 6.8 g/dL (ref 6.5–8.1)

## 2016-04-22 LAB — RAPID URINE DRUG SCREEN, HOSP PERFORMED
Amphetamines: NOT DETECTED
BARBITURATES: NOT DETECTED
Benzodiazepines: NOT DETECTED
Cocaine: NOT DETECTED
Opiates: NOT DETECTED
TETRAHYDROCANNABINOL: NOT DETECTED

## 2016-04-22 LAB — CBC
HEMATOCRIT: 46.9 % (ref 39.0–52.0)
HEMOGLOBIN: 15.8 g/dL (ref 13.0–17.0)
MCH: 34.7 pg — AB (ref 26.0–34.0)
MCHC: 33.7 g/dL (ref 30.0–36.0)
MCV: 103.1 fL — ABNORMAL HIGH (ref 78.0–100.0)
Platelets: 185 10*3/uL (ref 150–400)
RBC: 4.55 MIL/uL (ref 4.22–5.81)
RDW: 16 % — AB (ref 11.5–15.5)
WBC: 6.8 10*3/uL (ref 4.0–10.5)

## 2016-04-22 LAB — I-STAT ARTERIAL BLOOD GAS, ED
Acid-base deficit: 5 mmol/L — ABNORMAL HIGH (ref 0.0–2.0)
Bicarbonate: 21.3 mEq/L (ref 20.0–24.0)
O2 SAT: 87 %
PCO2 ART: 42.8 mmHg (ref 35.0–45.0)
PH ART: 7.305 — AB (ref 7.350–7.450)
PO2 ART: 58 mmHg — AB (ref 80.0–100.0)
TCO2: 23 mmol/L (ref 0–100)

## 2016-04-22 LAB — ETHANOL: Alcohol, Ethyl (B): 274 mg/dL — ABNORMAL HIGH (ref <5)

## 2016-04-22 LAB — BRAIN NATRIURETIC PEPTIDE: B Natriuretic Peptide: 17.4 pg/mL (ref 0.0–100.0)

## 2016-04-22 LAB — SALICYLATE LEVEL: Salicylate Lvl: <4 mg/dL (ref 2.8–30.0)

## 2016-04-22 LAB — I-STAT TROPONIN, ED: Troponin i, poc: 0.01 ng/mL (ref 0.00–0.08)

## 2016-04-22 LAB — ACETAMINOPHEN LEVEL

## 2016-04-22 MED ORDER — HEPARIN BOLUS VIA INFUSION
4500.0000 [IU] | Freq: Once | INTRAVENOUS | Status: AC
  Administered 2016-04-22: 4500 [IU] via INTRAVENOUS
  Filled 2016-04-22: qty 4500

## 2016-04-22 MED ORDER — IOPAMIDOL (ISOVUE-370) INJECTION 76%
INTRAVENOUS | Status: AC
  Administered 2016-04-22: 100 mL
  Filled 2016-04-22: qty 100

## 2016-04-22 MED ORDER — IPRATROPIUM-ALBUTEROL 0.5-2.5 (3) MG/3ML IN SOLN: 3.0000 mL | RESPIRATORY_TRACT | Status: DC

## 2016-04-22 MED ORDER — PREDNISONE 20 MG PO TABS
60.0000 mg | ORAL_TABLET | Freq: Once | ORAL | Status: AC
  Administered 2016-04-22: 60 mg via ORAL
  Filled 2016-04-22: qty 3

## 2016-04-22 MED ORDER — HEPARIN (PORCINE) IN NACL 100-0.45 UNIT/ML-% IJ SOLN
1600.0000 [IU]/h | INTRAMUSCULAR | Status: DC
  Administered 2016-04-22 – 2016-04-23 (×2): 1600 [IU]/h via INTRAVENOUS
  Filled 2016-04-22 (×3): qty 250

## 2016-04-22 MED ORDER — IPRATROPIUM BROMIDE 0.02 % IN SOLN
1.0000 mg | Freq: Once | RESPIRATORY_TRACT | Status: AC
  Administered 2016-04-22: 1 mg via RESPIRATORY_TRACT
  Filled 2016-04-22: qty 5

## 2016-04-22 MED ORDER — ALBUTEROL (5 MG/ML) CONTINUOUS INHALATION SOLN
10.0000 mg/h | INHALATION_SOLUTION | RESPIRATORY_TRACT | Status: DC
  Administered 2016-04-22: 10 mg/h via RESPIRATORY_TRACT
  Filled 2016-04-22: qty 20

## 2016-04-22 NOTE — Progress Notes (Signed)
ANTICOAGULATION CONSULT NOTE - Initial Consult  Pharmacy Consult for heparin.  Indication: history of  DVT  No Known Allergies  Patient Measurements:   Heparin Dosing Weight: 95.3 kg   Vital Signs: Temp: 98 F (36.7 C) (08/16 1625) Temp Source: Oral (08/16 1625) BP: 98/63 (08/16 1625) Pulse Rate: 95 (08/16 1625)  Labs: No results for input(s): HGB, HCT, PLT, APTT, LABPROT, INR, HEPARINUNFRC, HEPRLOWMOCWT, CREATININE, CKTOTAL, CKMB, TROPONINI in the last 72 hours.  CrCl cannot be calculated (Unknown ideal weight.).   Medical History: Past Medical History:  Diagnosis Date  . Collagen vascular disease ([place])   . Coronary artery disease   . Degenerative joint disease   . Degenerative joint disease of spine   . Depression   . DVT (deep venous thrombosis) ([place])   . ETOH abuse   . Hepatitis B   . Lupus anticoagulant disorder ([place]) 02/02/2012  . Pulmonary embolism ([place]) 06/2010    Medications:  Infusions:  . heparin      Assessment: [name] is a 50 yo male who presented to the ED on 8/16 with shortness of breath. Patient has a history of 2 DVTs in the left leg and has not been taking his PTA Xarelto. No signs of bleeding have been noted. Labs pending.   Goal of Therapy:  Heparin level 0.3-0.7 units/ml Monitor platelets by anticoagulation protocol: Yes   Plan:  Give 4500 units bolus x 1  Heparin infusion at 1600 units/hr Check a 6 hour HL Daily HL, CBC, s/sx of bleeding  Pasty Spillers D Pharmacy Resident Pager: [phone] 04/22/2016,5:31 PM

## 2016-04-22 NOTE — ED Triage Notes (Signed)
Patient homeless. C/o SOB x 3 days. Hx of bronchitis, PNA. Patient intoxicated and unable to give history. 92-93% on 4 L nasal cannula. Patient out of medications for 3 weeks. 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 20 g IV 125 mg Solumedrol. Attempted to give albuterol 4 mg, patient took of mask due to "hearing voices in mask." NSR, 110/62, 96 HR, CBG 87 per EMS, denies chest pain.
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
## 2016-04-22 NOTE — ED Notes (Signed)
Patient wanded by security. 

## 2016-04-22 NOTE — ED Notes (Signed)
Gave patient something to drink and some graham crackers.

## 2016-04-22 NOTE — ED Notes (Signed)
Patient taken off 02 at 2200, patient starting having low o2 saturations, lowest at 84%, patient put back on 2 L.

## 2016-04-23 ENCOUNTER — Encounter (HOSPITAL_COMMUNITY): Payer: Self-pay | Admitting: Family Medicine

## 2016-04-23 DIAGNOSIS — I82502 Chronic embolism and thrombosis of unspecified deep veins of left lower extremity: Secondary | ICD-10-CM

## 2016-04-23 DIAGNOSIS — M7989 Other specified soft tissue disorders: Secondary | ICD-10-CM

## 2016-04-23 DIAGNOSIS — J441 Chronic obstructive pulmonary disease with (acute) exacerbation: Principal | ICD-10-CM

## 2016-04-23 DIAGNOSIS — F1010 Alcohol abuse, uncomplicated: Secondary | ICD-10-CM

## 2016-04-23 DIAGNOSIS — I82402 Acute embolism and thrombosis of unspecified deep veins of left lower extremity: Secondary | ICD-10-CM

## 2016-04-23 DIAGNOSIS — J9601 Acute respiratory failure with hypoxia: Secondary | ICD-10-CM

## 2016-04-23 DIAGNOSIS — F10129 Alcohol abuse with intoxication, unspecified: Secondary | ICD-10-CM

## 2016-04-23 DIAGNOSIS — Z720 Tobacco use: Secondary | ICD-10-CM

## 2016-04-23 DIAGNOSIS — I87009 Postthrombotic syndrome without complications of unspecified extremity: Secondary | ICD-10-CM

## 2016-04-23 LAB — CBC
HCT: 44.2 % (ref 39.0–52.0)
HEMOGLOBIN: 14.3 g/dL (ref 13.0–17.0)
MCH: 33.3 pg (ref 26.0–34.0)
MCHC: 32.4 g/dL (ref 30.0–36.0)
MCV: 102.8 fL — ABNORMAL HIGH (ref 78.0–100.0)
Platelets: 149 10*3/uL — ABNORMAL LOW (ref 150–400)
RBC: 4.3 MIL/uL (ref 4.22–5.81)
RDW: 15.8 % — AB (ref 11.5–15.5)
WBC: 5.8 10*3/uL (ref 4.0–10.5)

## 2016-04-23 LAB — BASIC METABOLIC PANEL
ANION GAP: 10 (ref 5–15)
BUN: <5 mg/dL — ABNORMAL LOW (ref 6–20)
CHLORIDE: 104 mmol/L (ref 101–111)
CO2: 21 mmol/L — AB (ref 22–32)
Calcium: 8.5 mg/dL — ABNORMAL LOW (ref 8.9–10.3)
Creatinine, Ser: 0.62 mg/dL (ref 0.61–1.24)
GFR calc non Af Amer: >60 mL/min (ref >60)
Glucose, Bld: 197 mg/dL — ABNORMAL HIGH (ref 65–99)
POTASSIUM: 3.8 mmol/L (ref 3.5–5.1)
SODIUM: 135 mmol/L (ref 135–145)

## 2016-04-23 LAB — HEPARIN LEVEL (UNFRACTIONATED)
HEPARIN UNFRACTIONATED: 0.36 [IU]/mL (ref 0.30–0.70)
HEPARIN UNFRACTIONATED: 0.34 [IU]/mL (ref 0.30–0.70)

## 2016-04-23 MED ORDER — CARVEDILOL 6.25 MG PO TABS
6.2500 mg | ORAL_TABLET | Freq: Two times a day (BID) | ORAL | Status: DC
  Administered 2016-04-23 – 2016-04-27 (×9): 6.25 mg via ORAL
  Filled 2016-04-23 (×9): qty 1

## 2016-04-23 MED ORDER — PREDNISONE 50 MG PO TABS
50.0000 mg | ORAL_TABLET | Freq: Every day | ORAL | Status: DC
  Administered 2016-04-23 – 2016-04-24 (×2): 50 mg via ORAL
  Filled 2016-04-23 (×2): qty 1

## 2016-04-23 MED ORDER — ADULT MULTIVITAMIN W/MINERALS CH
1.0000 | ORAL_TABLET | Freq: Every day | ORAL | Status: DC
  Administered 2016-04-23 – 2016-04-27 (×5): 1 via ORAL
  Filled 2016-04-23 (×5): qty 1

## 2016-04-23 MED ORDER — ALBUTEROL SULFATE (2.5 MG/3ML) 0.083% IN NEBU
2.5000 mg | INHALATION_SOLUTION | Freq: Four times a day (QID) | RESPIRATORY_TRACT | Status: DC
  Filled 2016-04-23 (×2): qty 3

## 2016-04-23 MED ORDER — THIAMINE HCL 100 MG/ML IJ SOLN: 100.0000 mg | Freq: Every day | INTRAMUSCULAR | Status: DC

## 2016-04-23 MED ORDER — FOLIC ACID 1 MG PO TABS: 1.0000 mg | ORAL_TABLET | Freq: Every day | ORAL | Status: DC

## 2016-04-23 MED ORDER — LORAZEPAM 2 MG/ML IJ SOLN: 1.0000 mg | Freq: Four times a day (QID) | INTRAMUSCULAR | Status: AC | PRN

## 2016-04-23 MED ORDER — ALBUTEROL SULFATE (2.5 MG/3ML) 0.083% IN NEBU: 2.5000 mg | INHALATION_SOLUTION | RESPIRATORY_TRACT | Status: DC | PRN

## 2016-04-23 MED ORDER — VITAMIN B-1 100 MG PO TABS: 100.0000 mg | ORAL_TABLET | Freq: Every day | ORAL | Status: DC

## 2016-04-23 MED ORDER — CITALOPRAM HYDROBROMIDE 20 MG PO TABS
20.0000 mg | ORAL_TABLET | Freq: Every day | ORAL | Status: DC
  Administered 2016-04-23 – 2016-04-25 (×3): 20 mg via ORAL
  Filled 2016-04-23 (×3): qty 1

## 2016-04-23 MED ORDER — FOLIC ACID 1 MG PO TABS
1.0000 mg | ORAL_TABLET | Freq: Every day | ORAL | Status: DC
  Administered 2016-04-23 – 2016-04-27 (×5): 1 mg via ORAL
  Filled 2016-04-23 (×5): qty 1

## 2016-04-23 MED ORDER — LORAZEPAM 1 MG PO TABS
1.0000 mg | ORAL_TABLET | Freq: Four times a day (QID) | ORAL | Status: AC | PRN
  Administered 2016-04-23 – 2016-04-25 (×4): 1 mg via ORAL
  Filled 2016-04-23 (×6): qty 1

## 2016-04-23 MED ORDER — VITAMIN B-1 100 MG PO TABS
100.0000 mg | ORAL_TABLET | Freq: Every day | ORAL | Status: DC
  Administered 2016-04-23 – 2016-04-27 (×5): 100 mg via ORAL
  Filled 2016-04-23 (×5): qty 1

## 2016-04-23 MED ORDER — TRAZODONE HCL 100 MG PO TABS
150.0000 mg | ORAL_TABLET | Freq: Every day | ORAL | Status: DC
  Administered 2016-04-23 – 2016-04-26 (×5): 150 mg via ORAL
  Filled 2016-04-23 (×5): qty 1

## 2016-04-23 MED ORDER — RIVAROXABAN 20 MG PO TABS
20.0000 mg | ORAL_TABLET | Freq: Every day | ORAL | Status: DC
  Administered 2016-04-23 – 2016-04-27 (×5): 20 mg via ORAL
  Filled 2016-04-23 (×5): qty 1

## 2016-04-23 MED ORDER — IPRATROPIUM-ALBUTEROL 0.5-2.5 (3) MG/3ML IN SOLN
3.0000 mL | Freq: Four times a day (QID) | RESPIRATORY_TRACT | Status: DC
  Filled 2016-04-23 (×2): qty 3

## 2016-04-23 MED ORDER — HYDROXYZINE HCL 25 MG PO TABS
25.0000 mg | ORAL_TABLET | Freq: Three times a day (TID) | ORAL | Status: DC | PRN
  Administered 2016-04-25: 25 mg via ORAL
  Filled 2016-04-23: qty 1

## 2016-04-23 NOTE — Progress Notes (Signed)
ANTICOAGULATION CONSULT NOTE - Follow Up Consult  Pharmacy Consult for Heparin  Indication: Hx DVT  No Known Allergies  Patient Measurements: Height: 6' (182.9 cm) IBW/kg (Calculated) : 77.6  Vital Signs: Temp: 98.3 F (36.8 C) (08/17 0330) Temp Source: Oral (08/17 0330) BP: 146/87 (08/17 0330) Pulse Rate: 112 (08/17 0330)  Labs:  Recent Labs  04/22/16 1810 04/22/16 1853 04/23/16 0436  HGB 15.8  --  14.3  HCT 46.9  --  44.2  PLT 185  --  149*  HEPARINUNFRC  --   --  0.36  CREATININE  --  0.65 0.62    Assessment: 50 y/o M on heparin for hx DVT, hasn't been taking Xarelto, initial heparin level is therapeutic  Goal of Therapy:  Heparin level 0.3-0.7 units/ml Monitor platelets by anticoagulation protocol: Yes   Plan:  -Cont heparin at 1600 units/hr -1200 confirmatory HL   Narda Bonds 04/23/2016,6:25 AM

## 2016-04-23 NOTE — ED Notes (Signed)
Attempted to call report to [phone] x 1.  

## 2016-04-23 NOTE — H&P (Signed)
History and Physical  Patient Name: [name]     [id number]    DOB: 1966-01-17    DOA: 04/22/2016 PCP: No PCP Per Patient   Patient coming from: Homeless  Chief Complaint: Dyspnea and wheezing  HPI: [name] is a 50 y.o. male with a past medical history significant for homelessness, alcohol dependence, depression with suicidality, lupus anticoagulant with recurrent DVT, chronic left DVT, and COPD who presents with dyspnea and wheezing for 1 week.  The patient was in his usual state of health [place] when he started to develop respiratory symptoms one week ago.  Over the last week, this cough, increased phlegm, shortness of breath with exertion and wheezing have been getting worse until today when he was short of breath and wheezing at rest and so came to the ER.  ED course: -Afebrile, tachycardic, respirations >20, SpO2 87% on room air, initially requiring NRB -Na 141, K 4.1, Cr 0.65 (baseline), WBC 6.8K, Hgb 15 -BNP and troponin normal -Alcohol level elevated -CT aniogram of chest showed no PE -He was wheezy on exam and his respiratory effort improved with Duonebs and methylpredisolone and then TRH were asked to evaluate for amdission  He complained to EDP of suicidality because of the stress of his situation, and then to me on prompting.  He has attempted suicide before, via cutting wrists and overdose he states, and thinks this time he would do it with sleeping pills, although it's not clear that he has access to sleeping pills, so this may not be a specific plan.  Regarding anticoagulation, he has been on this long term for recurrent DVT and apparently per old Hematology notes lupus anticoagulant.  He has failed warfarin because he does not have a phone or transportation to get to appointments.  He failed Xarelto because his notes suggest that at one point he had progression of DVT despite Xarelto, but also because he no longer has Medicaid and  can no longer afford this past the month-long prescriptions he gets from the hospital.       ROS: Review of Systems  Constitutional: Negative for chills, fever and malaise/fatigue.  Respiratory: Positive for cough, sputum production, shortness of breath and wheezing. Negative for hemoptysis.   Cardiovascular: Positive for leg swelling. Negative for chest pain.  All other systems reviewed and are negative.        Past Medical History:  Diagnosis Date  . Collagen vascular disease ([place])   . COPD (chronic obstructive pulmonary disease) ([place])   . Coronary artery disease   . Degenerative joint disease   . Degenerative joint disease of spine   . Depression   . DVT (deep venous thrombosis) ([place])   . ETOH abuse   . Hepatitis B   . Lupus anticoagulant disorder ([place]) 02/02/2012  . Pulmonary embolism ([place]) 06/2010    Past Surgical History:  Procedure Laterality Date  . LEFT ELBOW SURGERY    . TONSILLECTOMY      Social History: Patient lives in a tent.  The patient walks unassisted.  He smokes daily.  He uses alcohol.  He is from [place] originally.  He has no idea where his family live now.    No Known Allergies  Family history: family history includes Cancer in his mother.  Prior to Admission medications   Medication Sig Start Date End Date Taking? Authorizing Provider  carvedilol (COREG) 6.25 MG tablet Take 1 tablet (6.25 mg total) by mouth 2 (  two) times daily with a meal. For high blood pressure 02/24/16   Encarnacion Slates, NP  citalopram (CELEXA) 20 MG tablet Take 1 tablet (20 mg total) by mouth daily. For depression 02/24/16   Encarnacion Slates, NP  diclofenac sodium (VOLTAREN) 1 % GEL Apply 2 g topically 4 (four) times daily. For arthritic pain 02/24/16   Encarnacion Slates, NP  folic acid (FOLVITE) 1 MG tablet Take 1 tablet (1 mg total) by mouth daily. For low folate 02/24/16   Encarnacion Slates, NP  hydrOXYzine (ATARAX/VISTARIL) 25 MG tablet Take 1 tablet (25 mg total) by mouth 3 (three) times  daily as needed for anxiety. 02/24/16   Encarnacion Slates, NP  nicotine (NICODERM CQ - DOSED IN MG/24 HOURS) 21 mg/24hr patch Place 1 patch (21 mg total) onto the skin daily at 6 (six) AM. For smoking cessation 02/24/16   Encarnacion Slates, NP  rivaroxaban (XARELTO) 20 MG TABS tablet Take 1 tablet (20 mg total) by mouth daily with breakfast. For prevention of blood clot 02/24/16   Encarnacion Slates, NP  thiamine 100 MG tablet Take 1 tablet (100 mg total) by mouth daily. For low Thiamine 02/24/16   Encarnacion Slates, NP  traZODone (DESYREL) 150 MG tablet Take 1 tablet (150 mg total) by mouth at bedtime. For sleep 02/24/16   Encarnacion Slates, NP       Physical Exam: BP 103/57   Pulse 105   Temp 98 F (36.7 C) (Oral)   Resp 17   SpO2 92%  General appearance: Well-developed, adult male, alert and in no acute distress.   Eyes: Anicteric, conjunctiva pink, lids and lashes normal.     ENT: No nasal deformity, discharge, or epistaxis.  OP moist without lesions.  Lymph: No cervical or supraclavicular lymphadenopathy. Skin: Warm and dry.  No jaundice.  No suspicious rashes or lesions.  Chronic venous stasis changes to left leg, moderate swelling. Cardiac: RRR, nl S1-S2, no murmurs appreciated.  Capillary refill is brisk.  JVP normal.  Radial and DP pulses 2+ and symmetric. Respiratory: Normal respiratory rate and rhythm.  Expiratory wheezes at bases bilaterally, no rales. GI: Abdomen soft without rigidity.  No TTP. No ascites, distension, hepatosplenomegaly.   MSK: No deformities or effusions.  No clubbing/cyanosis. Neuro: Cranial nerves normal. Sensorium intact and responding to questions, attention normal.  Speech is fluent.  Moves all extremities equally and with normal coordination.    Psych: Affect agitated.  Judgment and insight are impaired, patient dwells on affording his anticoagulants and his denied Medicaid.    Labs on Admission:  I have personally reviewed following labs and imaging studies: CBC:  Recent  Labs Lab 04/22/16 1810  WBC 6.8  HGB 15.8  HCT 46.9  MCV 103.1*  PLT 123XX123   Basic Metabolic Panel:  Recent Labs Lab 04/22/16 1853  NA 141  K 4.1  CL 111  CO2 19*  GLUCOSE 116*  BUN <5*  CREATININE 0.65  CALCIUM 8.1*   GFR: CrCl cannot be calculated (Unknown ideal weight.).  Liver Function Tests:  Recent Labs Lab 04/22/16 1853  AST 85*  ALT 66*  ALKPHOS 89  BILITOT 0.3  PROT 6.8  ALBUMIN 3.3*         Radiological Exams on Admission: Personally reviewed: Ct Angio Chest Pe W And/or Wo Contrast  Result Date: 04/22/2016 CLINICAL DATA:  Shortness of breath for 3 days.  Hypoxia. EXAM: CT ANGIOGRAPHY CHEST WITH CONTRAST TECHNIQUE: Multidetector CT imaging  of the chest was performed using the standard protocol during bolus administration of intravenous contrast. Multiplanar CT image reconstructions and MIPs were obtained to evaluate the vascular anatomy. CONTRAST:  80 mL Isovue 370 IV COMPARISON:  CTA chest 02/08/2016.  Two-view chest x-ray 02/24/2016. FINDINGS: Cardiovascular: The heart size is normal. No significant pericardial effusion is present. Mediastinum/Nodes: Pulmonary arterial opacification is satisfactory. There are no focal filling defects to suggest pulmonary emboli. No significant mediastinal or axillary adenopathy is present. The thoracic inlet is incompletely imaged due to the patient moving prior to scan. Lungs/Pleura: This study is mildly degraded by patient motion. A calcified granuloma within the superior segment of the left lower lobe is stable. Linear atelectasis is present at the right lung base. There is some chronic scarring in the right lower lobe as well. The lungs are otherwise clear. Upper Abdomen: Diffuse fatty infiltration of the liver is again noted. No discrete lesions are evident. Musculoskeletal: Bone windows are unremarkable. Vertebral body heights alignment are maintained. Ribs are unremarkable. The sternum is intact. Review of the MIP images  confirms the above findings. IMPRESSION: 1. No pulmonary embolus. 2. Mild atelectasis and scarring. 3. Hepatic steatosis. Electronically Signed   By: San Morelle M.D.   On: 04/22/2016 21:40    EKG: Independently reviewed. Rate 96, QTc normal, sinus rhythm, no ischemic changes.    Assessment/Plan 1. COPD flare:  He is wheezy on exam and has symptoms typical of a COPD flare.  It appears he has some degree of chronic bronchitis, for which he is not treated. -Prednisone 50 mg for 5 days -Albuterol scheduled and PRN -Defer antibiotics for now -Consult to Care Management for assistance obtaining ablbuterol inhaler -Patient needs PCP follow up -Smoking cessation recommended   2. 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  Patient has been tried on warfarin and NOACs in the past.  Was noncompliant with both for obvious reasons (lack of transportation and lack of insurance now).  At last admission was discharged on warfarin and Lovenox, but this was changed by [facility] to Xarelto for unknown reasons.    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 -Heparin for now -Consult to Care Management and Social work regarding feasibility of NOAC vs warfarin.  Probably neither are feasible if he cannot obtain Disability and Medicaid  3. Alcohol abuse and depression:  -Consult to Psychiatry -Bedside sitter       DVT prophylaxis: Heparin  Code Status: FULL Family Communication: None present  Disposition Plan: Anticipate prednisone and nebulized bronchodilators for COPD.  If improving, discharge within 2 days likely.   Regarding anticoagulation, if ancillary staff are able to find affordable NOAC supply for him, he could likely be transitioned from heparin to this quickly, but otherwise he may have to be discharged without anticoagulant until his Disability comes through  again. Consults called: None Admission status: Inpatient    Medical decision making: Patient seen at 12:02 AM on 04/23/2016.  The patient was discussed with Dr. Leonette Monarch. What exists of the patient's chart was reviewed in depth.  Clinical condition: stable.        Edwin Dada [facility] Pager [phone]

## 2016-04-23 NOTE — Progress Notes (Signed)
   04/23/16 1200  Clinical Encounter Type  Visited With Patient  Visit Type Spiritual support  Referral From Nurse  Spiritual Encounters  Spiritual Needs Other (Comment) (Patient unwilling to discuss)  Stress Factors  Patient Stress Factors Other (Comment) (Patient unwilling to discuss)  Patient acknowledged he is generally in low spirits but steadfastly refused to discuss; said he is tired of talking. Kept television on loudly throughout visit. Chaplain assured him we are here 24/7 if he changes his mind. Vanda Waskey, Chaplain
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
## 2016-04-23 NOTE — Progress Notes (Addendum)
PROGRESS NOTE  [name] [id number] DOB: Sep 21, 1965 DOA: 04/22/2016 PCP: No PCP Per Patient  Brief History:  50 year old male with a history of homelessness, depression, COPD, CAD, Lupus anticoagulant, DVT/PE presented with approximately one-week history of worsening shortness of breath and worsening cough with yellow sputum. He denies any fevers, chills, chest pain, hemoptysis, vomiting, diarrhea. He was noted to be tachycardic with respirations greater than 20 and oxygen saturation of 87% on room air in the emergency department. The patient was started on Solu-Medrol bronchodilators and admission was requested for further treatment. The patient also had suicidal ideations in emergency department. Psychiatric consult has been requested.  Assessment/Plan: Acute respiratory failure with hypoxia -Improved -Presently stable on room air -Secondary to COPD exacerbation  COPD exacerbation -Continue prednisone -Continue bronchodilators -Pulmonary hygiene -Improving without antibiotics  Suicidal ideation -Psychiatry consult requested -Patient was [facility] discharged from [facility] 2017 -sitter  Lupus anticoagulant with recurrent DVTs -has not been compliant with rivaroxaban due to financial restraints -hx of thrombectomy x 2 and IVC filter -restart rivaroxaban  Alcohol Abuse -CIWA protocol  Tobacco abuse -cessation discussed   Disposition Plan:   Not stable for d/c Family Communication:  No Family at bedside--Total time spent 32 minutes.  Greater than 50% spent face to face counseling and coordinating care.  Time--> [id number]   Consultants:  psychiatry  Code Status:  FULL  DVT Prophylaxis:  rivaroxaban   Procedures: As Listed in Progress Note Above  Antibiotics: None    Subjective:  patient says that he is breathing better. Denies any chest pain, nausea, vomiting, diarrhea. Has some dyspnea on exertion. Denies any hemoptysis. Denies any headache, neck  pain, rashes.   Objective: Vitals:   04/23/16 0200 04/23/16 0310 04/23/16 0330 04/23/16 0822  BP: 138/83 131/74 (!) 146/87 (!) 150/91  Pulse: 109 114 (!) 112 (!) 107  Resp: 18 21 18 17   Temp:   98.3 F (36.8 C) 98.5 F (36.9 C)  TempSrc:   Oral Oral  SpO2: 90% (!) 88% 93% 96%  Height:   6' (1.829 m)     Intake/Output Summary (Last 24 hours) at 04/23/16 1242 Last data filed at 04/23/16 1003  Gross per 24 hour  Intake              240 ml  Output              900 ml  Net             -660 ml   Weight change:  Exam:   General:  Pt is alert, follows commands appropriately, not in acute distress  HEENT: No icterus, No thrush, No neck mass, [place]/AT  Cardiovascular: RRR, S1/S2, no rubs, no gallops  Respiratory: Bilateral scattered rales without any wheezing. Good air movement.   Abdomen: Soft/+BS, non tender, non distended, no guarding  Extremities: 2 + LLE edema, No lymphangitis, No petechiae, No rashes, no synovitis   Data Reviewed: I have personally reviewed following labs and imaging studies Basic Metabolic Panel:  Recent Labs Lab 04/22/16 1853 04/23/16 0436  NA 141 135  K 4.1 3.8  CL 111 104  CO2 19* 21*  GLUCOSE 116* 197*  BUN <5* <5*  CREATININE 0.65 0.62  CALCIUM 8.1* 8.5*   Liver Function Tests:  Recent Labs Lab 04/22/16 1853  AST 85*  ALT 66*  ALKPHOS 89  BILITOT 0.3  PROT 6.8  ALBUMIN 3.3*   No results for  input(s): LIPASE, AMYLASE in the last 168 hours. No results for input(s): AMMONIA in the last 168 hours. Coagulation Profile: No results for input(s): INR, PROTIME in the last 168 hours. CBC:  Recent Labs Lab 04/22/16 1810 04/23/16 0436  WBC 6.8 5.8  HGB 15.8 14.3  HCT 46.9 44.2  MCV 103.1* 102.8*  PLT 185 149*   Cardiac Enzymes: No results for input(s): CKTOTAL, CKMB, CKMBINDEX, TROPONINI in the last 168 hours. BNP: Invalid input(s): POCBNP CBG: No results for input(s): GLUCAP in the last 168 hours. HbA1C: No results for  input(s): HGBA1C in the last 72 hours. Urine analysis:    Component Value Date/Time   COLORURINE YELLOW 02/09/2016 [place] 02/09/2016 0553   LABSPEC >1.046 (H) 02/09/2016 0553   PHURINE 5.0 02/09/2016 0553   GLUCOSEU NEGATIVE 02/09/2016 0553   HGBUR NEGATIVE 02/09/2016 0553   BILIRUBINUR NEGATIVE 02/09/2016 0553   KETONESUR NEGATIVE 02/09/2016 0553   PROTEINUR NEGATIVE 02/09/2016 0553   UROBILINOGEN 0.2 04/25/2015 0245   NITRITE NEGATIVE 02/09/2016 0553   LEUKOCYTESUR NEGATIVE 02/09/2016 0553   Sepsis Labs: @LABRCNTIP (procalcitonin:4,lacticidven:4) )No results found for this or any previous visit (from the past 240 hour(s)).   Scheduled Meds: . albuterol  2.5 mg Nebulization Q6H  . carvedilol  6.25 mg Oral BID WC  . citalopram  20 mg Oral Daily  . folic acid  1 mg Oral Daily  . multivitamin with minerals  1 tablet Oral Daily  . predniSONE  50 mg Oral Q breakfast  . thiamine  100 mg Oral Daily  . traZODone  150 mg Oral QHS   Continuous Infusions: . heparin 1,600 Units/hr (04/23/16 0855)    Procedures/Studies: Ct Angio Chest Pe W And/or Wo Contrast  Result Date: 04/22/2016 CLINICAL DATA:  Shortness of breath for 3 days.  Hypoxia. EXAM: CT ANGIOGRAPHY CHEST WITH CONTRAST TECHNIQUE: Multidetector CT imaging of the chest was performed using the standard protocol during bolus administration of intravenous contrast. Multiplanar CT image reconstructions and MIPs were obtained to evaluate the vascular anatomy. CONTRAST:  80 mL Isovue 370 IV COMPARISON:  CTA chest 02/08/2016.  Two-view chest x-ray 02/24/2016. FINDINGS: Cardiovascular: The heart size is normal. No significant pericardial effusion is present. Mediastinum/Nodes: Pulmonary arterial opacification is satisfactory. There are no focal filling defects to suggest pulmonary emboli. No significant mediastinal or axillary adenopathy is present. The thoracic inlet is incompletely imaged due to the patient moving prior  to scan. Lungs/Pleura: This study is mildly degraded by patient motion. A calcified granuloma within the superior segment of the left lower lobe is stable. Linear atelectasis is present at the right lung base. There is some chronic scarring in the right lower lobe as well. The lungs are otherwise clear. Upper Abdomen: Diffuse fatty infiltration of the liver is again noted. No discrete lesions are evident. Musculoskeletal: Bone windows are unremarkable. Vertebral body heights alignment are maintained. Ribs are unremarkable. The sternum is intact. Review of the MIP images confirms the above findings. IMPRESSION: 1. No pulmonary embolus. 2. Mild atelectasis and scarring. 3. Hepatic steatosis. Electronically Signed   By: San Morelle M.D.   On: 04/22/2016 21:40    Serai Tukes, DO  [facility] Pager [phone]  If 7PM-7AM, please contact night-coverage [email or web address] Password [email or web address] 04/23/2016, 12:42 PM   LOS: 1 day

## 2016-04-23 NOTE — Care Management Note (Signed)
Case Management Note  Patient Details  Name: [name] MRN: [id number] Date of Birth: 12-10-1965  Subjective/Objective:                 CM following for progression and d/c planning.   Action/Plan: 04/23/2016 Noted pt lives in tent, therefore we will be unable to provide any [facility] services. Will assist with meds if pt is eligible( we have not helped in the past year).  Pt will need ongoing followup, will check with [facility] for followup. Noted order for COPD gold protocol, again this pt is homeless and without insurance at this time, Any pulmonary followup will need to be arranged while the pt is an inpt.  Pt ability to obtain medicatons on an ongoing basis will depend upon how reliable he is in getting to the [facility] or to [facility] downtown [place].   Expected Discharge Date:                  Expected Discharge Plan:  Home/Self Care  In-House Referral:  Clinical Social Work  Discharge planning Services  CM Consult  Post Acute Care Choice:    Choice offered to:     DME Arranged:    DME Agency:     HH Arranged:    HH Agency:     Status of Service:  In process, will continue to follow  If discussed at Long Length of Stay Meetings, dates discussed:    Additional Comments:  Adron Bene, RN 04/23/2016, 12:08 PM

## 2016-04-23 NOTE — ED Notes (Signed)
Pt is refusing to wear his oxygen his stats are 88% while sleeping upon waking it raised to 95%. Pt was very agitated and is very shaky while at bedside and is wanting his medicine that he takes at home so he can calm down. He stated "everything is in that damn computer I dont know how to work the damn thing but your the nurse so you figure it out". Nurse notified

## 2016-04-24 DIAGNOSIS — F332 Major depressive disorder, recurrent severe without psychotic features: Secondary | ICD-10-CM | POA: Diagnosis not present

## 2016-04-24 LAB — BASIC METABOLIC PANEL
Anion gap: 10 (ref 5–15)
BUN: 8 mg/dL (ref 6–20)
CALCIUM: 8.7 mg/dL — AB (ref 8.9–10.3)
CO2: 26 mmol/L (ref 22–32)
CREATININE: 0.68 mg/dL (ref 0.61–1.24)
Chloride: 104 mmol/L (ref 101–111)
GFR calc non Af Amer: >60 mL/min (ref >60)
GLUCOSE: 83 mg/dL (ref 65–99)
Potassium: 3.3 mmol/L — ABNORMAL LOW (ref 3.5–5.1)
Sodium: 140 mmol/L (ref 135–145)

## 2016-04-24 LAB — CBC
HEMATOCRIT: 45.3 % (ref 39.0–52.0)
HEMOGLOBIN: 14.3 g/dL (ref 13.0–17.0)
MCH: 32.6 pg (ref 26.0–34.0)
MCHC: 31.6 g/dL (ref 30.0–36.0)
MCV: 103.4 fL — ABNORMAL HIGH (ref 78.0–100.0)
Platelets: 147 10*3/uL — ABNORMAL LOW (ref 150–400)
RBC: 4.38 MIL/uL (ref 4.22–5.81)
RDW: 15.8 % — ABNORMAL HIGH (ref 11.5–15.5)
WBC: 11.3 10*3/uL — AB (ref 4.0–10.5)

## 2016-04-24 LAB — C DIFFICILE QUICK SCREEN W PCR REFLEX
C DIFFICILE (CDIFF) TOXIN: NEGATIVE
C Diff antigen: NEGATIVE
C Diff interpretation: NOT DETECTED

## 2016-04-24 MED ORDER — IPRATROPIUM-ALBUTEROL 0.5-2.5 (3) MG/3ML IN SOLN: 3.0000 mL | RESPIRATORY_TRACT | Status: DC | PRN

## 2016-04-24 MED ORDER — NICOTINE 21 MG/24HR TD PT24
21.0000 mg | MEDICATED_PATCH | Freq: Every day | TRANSDERMAL | Status: DC
  Administered 2016-04-24 – 2016-04-27 (×4): 21 mg via TRANSDERMAL
  Filled 2016-04-24 (×4): qty 1

## 2016-04-24 MED ORDER — PREDNISONE 20 MG PO TABS
40.0000 mg | ORAL_TABLET | Freq: Every day | ORAL | Status: DC
  Administered 2016-04-25: 40 mg via ORAL
  Filled 2016-04-24: qty 2

## 2016-04-24 MED ORDER — POTASSIUM CHLORIDE CRYS ER 20 MEQ PO TBCR
40.0000 meq | EXTENDED_RELEASE_TABLET | Freq: Once | ORAL | Status: AC
  Administered 2016-04-24: 40 meq via ORAL
  Filled 2016-04-24: qty 2

## 2016-04-24 NOTE — Consult Note (Signed)
[facility] Face-to-Face Psychiatry Consult   Reason for Consult:  Depression, suicide ideation and homeless Referring Physician:  Dr. Carles Collet Patient Identification: [name] MRN:  [id number] Principal Diagnosis: COPD exacerbation [facility]) Diagnosis:   Patient Active Problem List   Diagnosis Date Noted  . Left leg swelling [M79.89]   . COPD exacerbation ([place]) [J44.1] 04/22/2016  . Uncomplicated alcohol dependence ([place]) [F10.20]   . Severe episode of recurrent major depressive disorder, without psychotic features ([place]) [F33.2]   . Acute respiratory failure with hypoxia ([place]) [J96.01] 02/09/2016  . Alcohol use disorder, severe, dependence ([place]) [F10.20] 02/09/2016  . Major depressive disorder, recurrent, severe without psychotic features ([place]) [F33.2]   . Alcohol abuse with intoxication ([place]) [F10.129] 02/10/2015  . Alcohol dependence with withdrawal, uncomplicated ([place]) [Y22.336] 02/09/2015  . Substance induced mood disorder ([place]) [F19.94] 02/09/2015  . Suicidal ideations [R45.851] 02/09/2015  . Alcohol dependence with alcohol-induced mood disorder ([place]) [F10.24]   . Suicidal ideation [R45.851]   . GAD (generalized anxiety disorder) [F41.1] 09/18/2014  . Recurrent major depression-severe ([place]) [F33.2] 09/18/2014  . Alcohol abuse with alcohol-induced mood disorder ([place]) [F10.14] 09/17/2014  . Alcohol intoxication ([place]) [F10.129]   . Left leg DVT ([place]) [I82.402] 06/06/2014  . Alcohol dependence ([name]) [F10.20] 01/27/2014  . Upper leg DVT (deep venous thromboembolism), acute ([place]) [I82.4Y9] 06/12/2013  . Homeless single person [Z59.0] 06/12/2013  . 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 Subtherapeutic international normalized ratio (INR) [R79.1] 12/18/2012  . Alcohol withdrawal ([place]) [F10.239] 11/03/2012  . Major depressive disorder, recurrent episode, severe ([place]) [F33.2] 11/03/2012  . Deep vein thrombosis ([place]) [I82.409] 04/13/2012  . Lupus anticoagulant disorder ([place]) [D68.62]  02/02/2012  . Degenerative joint disease [M19.90]   . Alcohol abuse [F10.10] 03/01/2011  . Tobacco abuse [Z72.0] 03/01/2011    Total Time spent with patient: 1 hour  Subjective:   [name] is a 50 y.o. male patient admitted with worsening shortness of breath and cough .  HPI:   [name]  Is a 50 years old male admitted to [facility]  With the  Upper respiratory infections and  shortness of breath.  Psychiatric consultation requested has patient started talking about death if he cannot get access to the medical care.  Patient reported he has been homeless over  12 years and four years he has been in [place]  And also stated he has no support system or family members.. Patient reportedly living in the tenth and panhandling. Patient reportedly do not like to stay in homeless shelters because of too many  Restrictions.  Patient reported being depressed in the past and also drinking alcohol as much as he can get and his hands. Patient reportedly drinks to control his depression and pain. Patient was pretty much upset and frustrated about Department of Social Services who canceled his Medicaid and not willing to be in state for unknown reasons.  He also reported he has been keep applying for Medicaidwithout success and reportedly working with multiple social workers and Conservation officer, historic buildings and no one is able to gave appropriate response to him.  Patient is seeking assistance to obtain Medicaid so that he can get his medication treatment and medical therapy has treated.  Patient  Initially repored suicidal ideation emergency department but currentlydenied current symptoms of depression, suicidal/homicidal ideation, intention or plans.  medical history: 50 year old male with a history of homelessness, depression, COPD, CAD, Lupus anticoagulant, DVT/PE presented with approximately one-week history of worsening shortness of breath and worsening cough with yellow sputum.  He denies any fevers,  chills, chest pain, hemoptysis, vomiting, diarrhea. He was noted to be tachycardic with respirations greater than 20 and oxygen saturation of 87% on room air in the emergency department. The patient was started on Solu-Medrol bronchodilators and admission was requested for further treatment. The patient also had suicidal ideations in emergency department. Psychiatric consult has been requested.  Past Psychiatric History: Depression and recent admission to [facility] for depression.   Risk to Self: Is patient at risk for suicide?: Yes Risk to Others:   Prior Inpatient Therapy:   Prior Outpatient Therapy:    Past Medical History:  Past Medical History:  Diagnosis Date  . Collagen vascular disease ([place])   . COPD (chronic obstructive pulmonary disease) ([place])   . Coronary artery disease   . Degenerative joint disease   . Degenerative joint disease of spine   . Depression   . DVT (deep venous thrombosis) ([place])   . ETOH abuse   . Hepatitis B   . Lupus anticoagulant disorder ([place]) 02/02/2012  . Pulmonary embolism ([place]) 06/2010    Past Surgical History:  Procedure Laterality Date  . LEFT ELBOW SURGERY    . TONSILLECTOMY     Family History:  Family History  Problem Relation Age of Onset  . Cancer Mother     Ovarian   Family Psychiatric  History:  Patient has no contact with his family members reportedly he was a ward of stat of  [place] while growing up.  Social History:  History  Alcohol Use  . 14.4 oz/week  . 24 Cans of beer per week    Comment: cas of beer and fifth of vodka daily     History  Drug Use No    Social History   Social History  . Marital status: Legally Separated    Spouse name: N/A  . Number of children: N/A  . Years of education: N/A   Occupational History  . Homeless   .  Not Employed   Social History Main Topics  . Smoking status: Current Every Day Smoker    Packs/day: 2.00    Years: 30.00    Types: Cigarettes  . Smokeless tobacco: Never Used  .  Alcohol use 14.4 oz/week    24 Cans of beer per week     Comment: cas of beer and fifth of vodka daily  . Drug use: No  . Sexual activity: No   Other Topics Concern  . None   Social History Narrative   Estranged from family.  Homeless.     Additional Social History:    Allergies:  No Known Allergies  Labs:  Results for orders placed or performed during the hospital encounter of 04/22/16 (from the past 48 hour(s))  Brain natriuretic peptide     Status: None   Collection Time: 04/22/16  5:50 PM  Result Value Ref Range   B Natriuretic Peptide 17.4 0.0 - 100.0 pg/mL  I-Stat arterial blood gas, ED     Status: Abnormal   Collection Time: 04/22/16  5:52 PM  Result Value Ref Range   pH, Arterial 7.305 (L) 7.350 - 7.450   pCO2 arterial 42.8 35.0 - 45.0 mmHg   pO2, Arterial 58.0 (L) 80.0 - 100.0 mmHg   Bicarbonate 21.3 20.0 - 24.0 mEq/L   TCO2 23 0 - 100 mmol/L   O2 Saturation 87.0 %   Acid-base deficit 5.0 (H) 0.0 - 2.0 mmol/L   Patient temperature HIDE    Sample type ARTERIAL  CBC     Status: Abnormal   Collection Time: 04/22/16  6:10 PM  Result Value Ref Range   WBC 6.8 4.0 - 10.5 K/uL   RBC 4.55 4.22 - 5.81 MIL/uL   Hemoglobin 15.8 13.0 - 17.0 g/dL   HCT 46.9 39.0 - 52.0 %   MCV 103.1 (H) 78.0 - 100.0 fL   MCH 34.7 (H) 26.0 - 34.0 pg   MCHC 33.7 30.0 - 36.0 g/dL   RDW 16.0 (H) 11.5 - 15.5 %   Platelets 185 150 - 400 K/uL  I-stat troponin, ED     Status: None   Collection Time: 04/22/16  6:11 PM  Result Value Ref Range   Troponin i, poc 0.01 0.00 - 0.08 ng/mL   Comment 3            Comment: Due to the release kinetics of cTnI, a negative result within the first hours of the onset of symptoms does not rule out myocardial infarction with certainty. If myocardial infarction is still suspected, repeat the test at appropriate intervals.   Comprehensive metabolic panel     Status: Abnormal   Collection Time: 04/22/16  6:53 PM  Result Value Ref Range   Sodium 141 135  - 145 mmol/L   Potassium 4.1 3.5 - 5.1 mmol/L   Chloride 111 101 - 111 mmol/L   CO2 19 (L) 22 - 32 mmol/L   Glucose, Bld 116 (H) 65 - 99 mg/dL   BUN <5 (L) 6 - 20 mg/dL   Creatinine, Ser 0.65 0.61 - 1.24 mg/dL   Calcium 8.1 (L) 8.9 - 10.3 mg/dL   Total Protein 6.8 6.5 - 8.1 g/dL   Albumin 3.3 (L) 3.5 - 5.0 g/dL   AST 85 (H) 15 - 41 U/L   ALT 66 (H) 17 - 63 U/L   Alkaline Phosphatase 89 38 - 126 U/L   Total Bilirubin 0.3 0.3 - 1.2 mg/dL   GFR calc non Af Amer >60 >60 mL/min   GFR calc Af Amer >60 >60 mL/min    Comment: (NOTE) The eGFR has been calculated using the CKD EPI equation. This calculation has not been validated in all clinical situations. eGFR's persistently <60 mL/min signify possible Chronic Kidney Disease.    Anion gap 11 5 - 15  Salicylate level     Status: None   Collection Time: 04/22/16  6:53 PM  Result Value Ref Range   Salicylate Lvl <6.2 2.8 - 30.0 mg/dL  Ethanol     Status: Abnormal   Collection Time: 04/22/16  6:53 PM  Result Value Ref Range   Alcohol, Ethyl (B) 274 (H) <5 mg/dL    Comment:        LOWEST DETECTABLE LIMIT FOR SERUM ALCOHOL IS 5 mg/dL FOR MEDICAL PURPOSES ONLY   Acetaminophen level     Status: Abnormal   Collection Time: 04/22/16  6:53 PM  Result Value Ref Range   Acetaminophen (Tylenol), Serum <10 (L) 10 - 30 ug/mL    Comment:        THERAPEUTIC CONCENTRATIONS VARY SIGNIFICANTLY. A RANGE OF 10-30 ug/mL MAY BE AN EFFECTIVE CONCENTRATION FOR MANY PATIENTS. HOWEVER, SOME ARE BEST TREATED AT CONCENTRATIONS OUTSIDE THIS RANGE. ACETAMINOPHEN CONCENTRATIONS >150 ug/mL AT 4 HOURS AFTER INGESTION AND >50 ug/mL AT 12 HOURS AFTER INGESTION ARE OFTEN ASSOCIATED WITH TOXIC REACTIONS.   Urine rapid drug screen (hosp performed)     Status: None   Collection Time: 04/22/16  7:52 PM  Result Value Ref  Range   Opiates NONE DETECTED NONE DETECTED   Cocaine NONE DETECTED NONE DETECTED   Benzodiazepines NONE DETECTED NONE DETECTED    Amphetamines NONE DETECTED NONE DETECTED   Tetrahydrocannabinol NONE DETECTED NONE DETECTED   Barbiturates NONE DETECTED NONE DETECTED    Comment:        DRUG SCREEN FOR MEDICAL PURPOSES ONLY.  IF CONFIRMATION IS NEEDED FOR ANY PURPOSE, NOTIFY LAB WITHIN 5 DAYS.        LOWEST DETECTABLE LIMITS FOR URINE DRUG SCREEN Drug Class       Cutoff (ng/mL) Amphetamine      1000 Barbiturate      200 Benzodiazepine   235 Tricyclics       361 Opiates          300 Cocaine          300 THC              50   Heparin level (unfractionated)     Status: None   Collection Time: 04/23/16  4:36 AM  Result Value Ref Range   Heparin Unfractionated 0.36 0.30 - 0.70 IU/mL    Comment:        IF HEPARIN RESULTS ARE BELOW EXPECTED VALUES, AND PATIENT DOSAGE HAS BEEN CONFIRMED, SUGGEST FOLLOW UP TESTING OF ANTITHROMBIN III LEVELS.   CBC     Status: Abnormal   Collection Time: 04/23/16  4:36 AM  Result Value Ref Range   WBC 5.8 4.0 - 10.5 K/uL   RBC 4.30 4.22 - 5.81 MIL/uL   Hemoglobin 14.3 13.0 - 17.0 g/dL   HCT 44.2 39.0 - 52.0 %   MCV 102.8 (H) 78.0 - 100.0 fL   MCH 33.3 26.0 - 34.0 pg   MCHC 32.4 30.0 - 36.0 g/dL   RDW 15.8 (H) 11.5 - 15.5 %   Platelets 149 (L) 150 - 400 K/uL  Basic metabolic panel     Status: Abnormal   Collection Time: 04/23/16  4:36 AM  Result Value Ref Range   Sodium 135 135 - 145 mmol/L   Potassium 3.8 3.5 - 5.1 mmol/L   Chloride 104 101 - 111 mmol/L   CO2 21 (L) 22 - 32 mmol/L   Glucose, Bld 197 (H) 65 - 99 mg/dL   BUN <5 (L) 6 - 20 mg/dL   Creatinine, Ser 0.62 0.61 - 1.24 mg/dL   Calcium 8.5 (L) 8.9 - 10.3 mg/dL   GFR calc non Af Amer >60 >60 mL/min   GFR calc Af Amer >60 >60 mL/min    Comment: (NOTE) The eGFR has been calculated using the CKD EPI equation. This calculation has not been validated in all clinical situations. eGFR's persistently <60 mL/min signify possible Chronic Kidney Disease.    Anion gap 10 5 - 15  Heparin level (unfractionated)      Status: None   Collection Time: 04/23/16 12:17 PM  Result Value Ref Range   Heparin Unfractionated 0.34 0.30 - 0.70 IU/mL    Comment:        IF HEPARIN RESULTS ARE BELOW EXPECTED VALUES, AND PATIENT DOSAGE HAS BEEN CONFIRMED, SUGGEST FOLLOW UP TESTING OF ANTITHROMBIN III LEVELS.   CBC     Status: Abnormal   Collection Time: 04/24/16  3:53 AM  Result Value Ref Range   WBC 11.3 (H) 4.0 - 10.5 K/uL   RBC 4.38 4.22 - 5.81 MIL/uL   Hemoglobin 14.3 13.0 - 17.0 g/dL   HCT 45.3 39.0 - 52.0 %   MCV  103.4 (H) 78.0 - 100.0 fL   MCH 32.6 26.0 - 34.0 pg   MCHC 31.6 30.0 - 36.0 g/dL   RDW 15.8 (H) 11.5 - 15.5 %   Platelets 147 (L) 150 - 400 K/uL  Basic metabolic panel     Status: Abnormal   Collection Time: 04/24/16  3:53 AM  Result Value Ref Range   Sodium 140 135 - 145 mmol/L   Potassium 3.3 (L) 3.5 - 5.1 mmol/L   Chloride 104 101 - 111 mmol/L   CO2 26 22 - 32 mmol/L   Glucose, Bld 83 65 - 99 mg/dL   BUN 8 6 - 20 mg/dL   Creatinine, Ser 0.68 0.61 - 1.24 mg/dL   Calcium 8.7 (L) 8.9 - 10.3 mg/dL   GFR calc non Af Amer >60 >60 mL/min   GFR calc Af Amer >60 >60 mL/min    Comment: (NOTE) The eGFR has been calculated using the CKD EPI equation. This calculation has not been validated in all clinical situations. eGFR's persistently <60 mL/min signify possible Chronic Kidney Disease.    Anion gap 10 5 - 15    Current Facility-Administered Medications  Medication Dose Route Frequency Provider Last Rate Last Dose  . albuterol (PROVENTIL) (2.5 MG/3ML) 0.083% nebulizer solution 2.5 mg  2.5 mg Nebulization Q2H PRN Edwin Dada, MD      . carvedilol (COREG) tablet 6.25 mg  6.25 mg Oral BID WC Edwin Dada, MD   6.25 mg at 04/24/16 0825  . citalopram (CELEXA) tablet 20 mg  20 mg Oral Daily Edwin Dada, MD   20 mg at 04/23/16 0843  . folic acid (FOLVITE) tablet 1 mg  1 mg Oral Daily Edwin Dada, MD   1 mg at 04/23/16 0843  . hydrOXYzine (ATARAX/VISTARIL)  tablet 25 mg  25 mg Oral TID PRN Edwin Dada, MD      . ipratropium-albuterol (DUONEB) 0.5-2.5 (3) MG/3ML nebulizer solution 3 mL  3 mL Nebulization Q4H PRN Orson Eva, MD      . LORazepam (ATIVAN) tablet 1 mg  1 mg Oral Q6H PRN Edwin Dada, MD   1 mg at 04/23/16 2154   Or  . LORazepam (ATIVAN) injection 1 mg  1 mg Intravenous Q6H PRN Edwin Dada, MD      . multivitamin with minerals tablet 1 tablet  1 tablet Oral Daily Edwin Dada, MD   1 tablet at 04/23/16 0844  . nicotine (NICODERM CQ - dosed in mg/24 hours) patch 21 mg  21 mg Transdermal Daily Shanon Brow Tat, MD      . predniSONE (DELTASONE) tablet 50 mg  50 mg Oral Q breakfast Edwin Dada, MD   50 mg at 04/24/16 0825  . rivaroxaban (XARELTO) tablet 20 mg  20 mg Oral Q breakfast Orson Eva, MD   20 mg at 04/24/16 0825  . thiamine (VITAMIN B-1) tablet 100 mg  100 mg Oral Daily Edwin Dada, MD   100 mg at 04/23/16 0844  . traZODone (DESYREL) tablet 150 mg  150 mg Oral QHS Edwin Dada, MD   150 mg at 04/23/16 2154    Musculoskeletal: Strength & Muscle Tone: within normal limits Gait & Station: normal Patient leans: Right  Psychiatric Specialty Exam: Physical Exam  ROS  Blood pressure 130/70, pulse 61, temperature 98.7 F (37.1 C), temperature source Oral, resp. rate 17, height 6' (1.829 m), weight 91.2 kg (201 lb 1 oz), SpO2 93 %.Body mass  index is 27.27 kg/m.  General Appearance: Disheveled  Eye Contact:  Good  Speech:  Clear and Coherent  Volume:  Normal  Mood:  Depressed  Affect:  Depressed and Labile  Thought Process:  Coherent and Goal Directed  Orientation:  Full (Time, Place, and Person)  Thought Content:  Rumination  Suicidal Thoughts:  No  Homicidal Thoughts:  No  Memory:  Immediate;   Good Recent;   Good  Judgement:  Intact  Insight:  Good  Psychomotor Activity:  Normal  Concentration:  Concentration: Good and Attention Span: Good  Recall:  Good  Fund  of Knowledge:  Good  Language:  Good  Akathisia:  Negative  Handed:  Right  AIMS (if indicated):     Assets:  Communication Skills Desire for Improvement Leisure Time Resilience  ADL's:  Intact  Cognition:  WNL  Sleep:        Treatment Plan Summary:  Patient presented with upper respiratory infection and also has history of depression secondary to multiple psychosocial stresses including homelessness , no family and no support system.  Patient endorses drinking alcohol whenever he can get and pan handling.  Patient is seeking  Case manager/LCSW who can provide assistance regarding obtaining Medicaid services.  He is hoping his  Medical doctors we provide appropriate documentation to the Department of Social Services.  Daily contact with patient to assess and evaluate symptoms and progress in treatment and Medication management   Ssafety concerns: patient contract for safety and discontinue safety sitter Major depressive disorder, recurrent :  Increase  Citalopram 40 mg daily for depresion Continue trazodone 150 mg at bedtime for insomnia Rreferred to the unit social Film/video editor for appropriate psychosocial support including documentation to the  department of social Securiy  Disposition: Patient will be referred to te outpatient medication management and medically stable Supportive therapy provided about ongoing stressors.  Ambrose Finland, MD 04/24/2016 9:59 AM

## 2016-04-24 NOTE — Care Management Note (Signed)
Case Management Note  Patient Details  Name: [name] MRN: [id number] Date of Birth: 1965-12-09  Subjective/Objective:    CM following for progression and d/c planning.                Action/Plan: 04/24/2016 [place], this pt is homeless and has not kept appointments in the past. Currently needs Xarelto for ongoing treatment. Pt has had Xarelto card and used the [facility] program several times, most recently in June 2017. Spoke with Xarelto rep who stated that he could provide samples to this pt if he has a follow up appointment at a clinic . [facility] has seen this pt in the past and has agreed to accept Xarelto samples on Monday 04/27/16 for this pt to pick up and have scheduled an appointment for May 06, 2016 to see their pulmonologist.  Xarelto rep notified and asked to deliver samples to [facility] on Monday 04/27/16. This CM notified Dr Tat and requested that pt not d/c until Monday 04/27/2016 so that necessary meds can be obtained as pt has no transportation back to [facility] to pick up samples if he is d/c over the weekend. The pt states that he can arrange transportation for his appointment for his May 06, 2016 appointment.   Expected Discharge Date:                  Expected Discharge Plan:  Home/Self Care  In-House Referral:  Clinical Social Work  Discharge planning Services  CM Consult, Medication Assistance  Post Acute Care Choice:    Choice offered to:     DME Arranged:    DME Agency:     HH Arranged:    HH Agency:     Status of Service:  In process, will continue to follow  If discussed at Long Length of Stay Meetings, dates discussed:    Additional Comments:  Adron Bene, RN 04/24/2016, 5:18 PM

## 2016-04-24 NOTE — Progress Notes (Signed)
Paged Dr. Carles Collet.  Pt is a 2 pack a day smoker, may he have a nicotine patch, pt stated it would help.

## 2016-04-24 NOTE — Discharge Instructions (Signed)
Information on my medicine - XARELTO (rivaroxaban)  This medication education was reviewed with me or my healthcare representative as part of my discharge preparation.    WHY WAS XARELTO PRESCRIBED FOR YOU? Xarelto was prescribed to treat blood clots that may have been found in the veins of your legs (deep vein thrombosis) or in your lungs (pulmonary embolism) and to reduce the risk of them occurring again.  What do you need to know about Xarelto? Take xarelto 20 mg ONCE A DAY with your evening meal.  DO NOT stop taking Xarelto without talking to the health care provider who prescribed the medication.  Refill your prescription for 20 mg tablets before you run out.  After discharge, you should have regular check-up appointments with your healthcare provider that is prescribing your Xarelto.  In the future your dose may need to be changed if your kidney function changes by a significant amount.  What do you do if you miss a dose? If you are taking Xarelto TWICE DAILY and you miss a dose, take it as soon as you remember. You may take two 15 mg tablets (total 30 mg) at the same time then resume your regularly scheduled 15 mg twice daily the next day.  If you are taking Xarelto ONCE DAILY and you miss a dose, take it as soon as you remember on the same day then continue your regularly scheduled once daily regimen the next day. Do not take two doses of Xarelto at the same time.   Important Safety Information Xarelto is a blood thinner medicine that can cause bleeding. You should call your healthcare provider right away if you experience any of the following: ? Bleeding from an injury or your nose that does not stop. ? Unusual colored urine (red or dark brown) or unusual colored stools (red or black). ? Unusual bruising for unknown reasons. ? A serious fall or if you hit your head (even if there is no bleeding).  Some medicines may interact with Xarelto and might increase your risk of  bleeding while on Xarelto. To help avoid this, consult your healthcare provider or pharmacist prior to using any new prescription or non-prescription medications, including herbals, vitamins, non-steroidal anti-inflammatory drugs (NSAIDs) and supplements.  This website has more information on Xarelto: [email or web address]

## 2016-04-24 NOTE — Progress Notes (Signed)
PROGRESS NOTE  [name] [id number] DOB: 1966/08/23 DOA: 04/22/2016 PCP: No PCP Per Patient Brief History:  50 year old male with a history of homelessness, depression, COPD, CAD, Lupus anticoagulant, DVT/PE presented with approximately one-week history of worsening shortness of breath and worsening cough with yellow sputum. He denies any fevers, chills, chest pain, hemoptysis, vomiting, diarrhea. He was noted to be tachycardic with respirations greater than 20 and oxygen saturation of 87% on room air in the emergency department. The patient was started on Solu-Medrol bronchodilators and admission was requested for further treatment. The patient also had suicidal ideations in emergency department. Psychiatric consult has been requested.  Assessment/Plan: Acute respiratory failure with hypoxia -Improved -Presently stable on room air -Secondary to COPD exacerbation  COPD exacerbation -Continue prednisone-->start weaning 04/25/16 -Continue bronchodilators -Pulmonary hygiene -Improving without antibiotics  Suicidal ideation -Psychiatry consult requested -Patient was recently discharged from [facility] in June 2017 -sitter  Lupus anticoagulant with recurrent DVTs -has not been compliant with rivaroxaban due to financial restraints -hx of thrombectomy x 2 and IVC filter -restart rivaroxaban  Diarrhea -Cdiff assay  Alcohol Abuse -CIWA protocol -no signs of withdrawal  Tobacco abuse -cessation discussed  Social Issues/Homelessness -case management trying to set up rivaroxaban x 60-90 days and for 1 year thereafter, but pt will need to wait until 04/27/16 to pick up at [place]:   Not stable for d/c Family Communication:  No Family at bedside   Consultants:  psychiatry  Code Status:  FULL  DVT Prophylaxis:  rivaroxaban   Procedures: As Listed in Progress Note Above  Antibiotics: None   Subjective: Patient states that  he is breathing better overall. Denies any fevers, chills, chest discomfort, nausea, vomiting, diarrhea, abdominal pain. No dysuria or hematuria. No headaches or neck pain  Objective: Vitals:   04/23/16 2120 04/24/16 0541 04/24/16 0822 04/24/16 1642  BP: 130/85 127/86 130/70 (!) 158/82  Pulse: 77 81 61 (!) 57  Resp: 18 18 17 17   Temp: 98.5 F (36.9 C) 98.7 F (37.1 C) 98.7 F (37.1 C) 98.6 F (37 C)  TempSrc: Oral  Oral Oral  SpO2: 93% 95% 93% 94%  Weight: 91.2 kg (201 lb 1 oz)     Height:        Intake/Output Summary (Last 24 hours) at 04/24/16 1813 Last data filed at 04/24/16 1407  Gross per 24 hour  Intake              840 ml  Output                0 ml  Net              840 ml   Weight change:  Exam:   General:  Pt is alert, follows commands appropriately, not in acute distress  HEENT: No icterus, No thrush, No neck mass, [place]/AT  Cardiovascular: RRR, S1/S2, no rubs, no gallops  Respiratory: Bibasilar rales with diminished breath sounds bilateral. No wheezing. Good air movement.  Abdomen: Soft/+BS, non tender, non distended, no guarding  Extremities: No edema, No lymphangitis, No petechiae, No rashes, no synovitis   Data Reviewed: I have personally reviewed following labs and imaging studies Basic Metabolic Panel:  Recent Labs Lab 04/22/16 1853 04/23/16 0436 04/24/16 0353  NA 141 135 140  K 4.1 3.8 3.3*  CL 111 104 104  CO2 19* 21* 26  GLUCOSE 116* 197* 83  BUN <5* <5* 8  CREATININE 0.65 0.62 0.68  CALCIUM 8.1* 8.5* 8.7*   Liver Function Tests:  Recent Labs Lab 04/22/16 1853  AST 85*  ALT 66*  ALKPHOS 89  BILITOT 0.3  PROT 6.8  ALBUMIN 3.3*   No results for input(s): LIPASE, AMYLASE in the last 168 hours. No results for input(s): AMMONIA in the last 168 hours. Coagulation Profile: No results for input(s): INR, PROTIME in the last 168 hours. CBC:  Recent Labs Lab 04/22/16 1810 04/23/16 0436 04/24/16 0353  WBC 6.8 5.8 11.3*  HGB  15.8 14.3 14.3  HCT 46.9 44.2 45.3  MCV 103.1* 102.8* 103.4*  PLT 185 149* 147*   Cardiac Enzymes: No results for input(s): CKTOTAL, CKMB, CKMBINDEX, TROPONINI in the last 168 hours. BNP: Invalid input(s): POCBNP CBG: No results for input(s): GLUCAP in the last 168 hours. HbA1C: No results for input(s): HGBA1C in the last 72 hours. Urine analysis:    Component Value Date/Time   COLORURINE YELLOW 02/09/2016 [place] 02/09/2016 0553   LABSPEC >1.046 (H) 02/09/2016 0553   PHURINE 5.0 02/09/2016 0553   GLUCOSEU NEGATIVE 02/09/2016 0553   HGBUR NEGATIVE 02/09/2016 0553   BILIRUBINUR NEGATIVE 02/09/2016 0553   KETONESUR NEGATIVE 02/09/2016 0553   PROTEINUR NEGATIVE 02/09/2016 0553   UROBILINOGEN 0.2 04/25/2015 0245   NITRITE NEGATIVE 02/09/2016 0553   LEUKOCYTESUR NEGATIVE 02/09/2016 0553   Sepsis Labs: @LABRCNTIP (procalcitonin:4,lacticidven:4) )No results found for this or any previous visit (from the past 240 hour(s)).   Scheduled Meds: . carvedilol  6.25 mg Oral BID WC  . citalopram  20 mg Oral Daily  . folic acid  1 mg Oral Daily  . multivitamin with minerals  1 tablet Oral Daily  . nicotine  21 mg Transdermal Daily  . predniSONE  50 mg Oral Q breakfast  . rivaroxaban  20 mg Oral Q breakfast  . thiamine  100 mg Oral Daily  . traZODone  150 mg Oral QHS   Continuous Infusions:   Procedures/Studies: Ct Angio Chest Pe W And/or Wo Contrast  Result Date: 04/22/2016 CLINICAL DATA:  Shortness of breath for 3 days.  Hypoxia. EXAM: CT ANGIOGRAPHY CHEST WITH CONTRAST TECHNIQUE: Multidetector CT imaging of the chest was performed using the standard protocol during bolus administration of intravenous contrast. Multiplanar CT image reconstructions and MIPs were obtained to evaluate the vascular anatomy. CONTRAST:  80 mL Isovue 370 IV COMPARISON:  CTA chest 02/08/2016.  Two-view chest x-ray 02/24/2016. FINDINGS: Cardiovascular: The heart size is normal. No significant  pericardial effusion is present. Mediastinum/Nodes: Pulmonary arterial opacification is satisfactory. There are no focal filling defects to suggest pulmonary emboli. No significant mediastinal or axillary adenopathy is present. The thoracic inlet is incompletely imaged due to the patient moving prior to scan. Lungs/Pleura: This study is mildly degraded by patient motion. A calcified granuloma within the superior segment of the left lower lobe is stable. Linear atelectasis is present at the right lung base. There is some chronic scarring in the right lower lobe as well. The lungs are otherwise clear. Upper Abdomen: Diffuse fatty infiltration of the liver is again noted. No discrete lesions are evident. Musculoskeletal: Bone windows are unremarkable. Vertebral body heights alignment are maintained. Ribs are unremarkable. The sternum is intact. Review of the MIP images confirms the above findings. IMPRESSION: 1. No pulmonary embolus. 2. Mild atelectasis and scarring. 3. Hepatic steatosis. Electronically Signed   By: San Morelle M.D.   On: 04/22/2016 21:40    Brylee Mcgreal, DO  [facility]  Pager [phone]  If 7PM-7AM, please contact night-coverage [email or web address] Password [email or web address] 04/24/2016, 6:13 PM   LOS: 2 days

## 2016-04-25 DIAGNOSIS — Z590 Homelessness: Secondary | ICD-10-CM

## 2016-04-25 DIAGNOSIS — D6862 Lupus anticoagulant syndrome: Secondary | ICD-10-CM

## 2016-04-25 LAB — BASIC METABOLIC PANEL WITH GFR
Anion gap: 6 (ref 5–15)
BUN: 7 mg/dL (ref 6–20)
CO2: 26 mmol/L (ref 22–32)
Calcium: 8.6 mg/dL — ABNORMAL LOW (ref 8.9–10.3)
Chloride: 107 mmol/L (ref 101–111)
Creatinine, Ser: 0.67 mg/dL (ref 0.61–1.24)
GFR calc Af Amer: >60 mL/min
GFR calc non Af Amer: >60 mL/min
Glucose, Bld: 94 mg/dL (ref 65–99)
Potassium: 3.6 mmol/L (ref 3.5–5.1)
Sodium: 139 mmol/L (ref 135–145)

## 2016-04-25 LAB — MAGNESIUM: MAGNESIUM: 1.9 mg/dL (ref 1.7–2.4)

## 2016-04-25 MED ORDER — PREDNISONE 20 MG PO TABS
30.0000 mg | ORAL_TABLET | Freq: Every day | ORAL | Status: DC
  Administered 2016-04-26: 30 mg via ORAL
  Filled 2016-04-25: qty 1

## 2016-04-25 MED ORDER — CITALOPRAM HYDROBROMIDE 40 MG PO TABS
40.0000 mg | ORAL_TABLET | Freq: Every day | ORAL | Status: DC
  Administered 2016-04-26 – 2016-04-27 (×2): 40 mg via ORAL
  Filled 2016-04-25 (×2): qty 1

## 2016-04-25 NOTE — Progress Notes (Signed)
PROGRESS NOTE  [name] [id number] DOB: May 21, 1966 DOA: 04/22/2016 PCP: No PCP Per Patient  Brief History: 50 year old male with a history of homelessness, depression, COPD, CAD, Lupus anticoagulant, DVT/PE presented with approximately one-week history of worsening shortness of breath and worsening cough with yellow sputum. He denies any fevers, chills, chest pain, hemoptysis, vomiting, diarrhea. He was noted to be tachycardic with respirations greater than 20 and oxygen saturation of 87% on room air in the emergency department. The patient was started on Solu-Medrol bronchodilators and admission was requested for further treatment. The patient also had suicidal ideations in emergency department. Psychiatric consult has been requested.  Assessment/Plan: Acute respiratory failure with hypoxia -Improved -Presently stable on room air -Secondary to COPD exacerbation  COPD exacerbation -Continue prednisone-->start weaning 04/25/16 -Continue bronchodilators -Pulmonary hygiene -Improving without antibiotics  Suicidal ideation -Psychiatry consult appreciated -Patient was recently discharged from [facility] in June 2017 -celexa increased to 40 mg daily -d/c sitter per psychiatry   Lupus anticoagulant with recurrent DVTs -has not been compliant with rivaroxaban due to financial restraints -hx of thrombectomy x 2 and IVC filter -restartedrivaroxaban  Diarrhea -Cdiff NEG  Alcohol Abuse -CIWA protocol -no signs of withdrawal  Tobacco abuse -cessation discussed  Social Issues/Homelessness -case management trying to set up rivaroxaban x 60-90 days and for 1 year thereafter, but pt will need to wait until 04/27/16 to pick up at [place]: Not stable for d/c Family Communication: NoFamily at bedside  Consultants: psychiatry  Code Status: FULL  DVT Prophylaxis: rivaroxaban   Procedures: As Listed in Progress Note  Above  Antibiotics: None   Procedures: As Listed in Progress Note Above  Antibiotics: None    Subjective: Patient states that he is breathing better everyday. Denies any headache, fevers, chills, neck pain, chest pain, nausea, vomiting, diarrhea, abdominal pain. No dysuria or hematuria.  Objective: Vitals:   04/24/16 1957 04/25/16 0453 04/25/16 0745 04/25/16 1621  BP: (!) 158/76 (!) 156/102 (!) 164/86 (!) 162/99  Pulse: (!) 58 60 (!) 54 (!) 57  Resp: 18 18 17 16   Temp: 98.1 F (36.7 C) 97.8 F (36.6 C) 98.4 F (36.9 C) 98.1 F (36.7 C)  TempSrc: Oral Oral Oral Oral  SpO2: 95% 96% 94% 96%  Weight:      Height:        Intake/Output Summary (Last 24 hours) at 04/25/16 1832 Last data filed at 04/25/16 1828  Gross per 24 hour  Intake             1200 ml  Output                1 ml  Net             1199 ml   Weight change:  Exam:   General:  Pt is alert, follows commands appropriately, not in acute distress  HEENT: No icterus, No thrush, No neck mass, [place]/AT  Cardiovascular: RRR, S1/S2, no rubs, no gallops  Respiratory: Bibasilar rales without wheezing. Good air movement.  Abdomen: Soft/+BS, non tender, non distended, no guarding  Extremities: No edema, No lymphangitis, No petechiae, No rashes, no synovitis   Data Reviewed: I have personally reviewed following labs and imaging studies Basic Metabolic Panel:  Recent Labs Lab 04/22/16 1853 04/23/16 0436 04/24/16 0353 04/25/16 0501  NA 141 135 140 139  K 4.1 3.8 3.3* 3.6  CL 111 104 104 107  CO2 19* 21* 26  26  GLUCOSE 116* 197* 83 94  BUN <5* <5* 8 7  CREATININE 0.65 0.62 0.68 0.67  CALCIUM 8.1* 8.5* 8.7* 8.6*  MG  --   --   --  1.9   Liver Function Tests:  Recent Labs Lab 04/22/16 1853  AST 85*  ALT 66*  ALKPHOS 89  BILITOT 0.3  PROT 6.8  ALBUMIN 3.3*   No results for input(s): LIPASE, AMYLASE in the last 168 hours. No results for input(s): AMMONIA in the last 168 hours. Coagulation  Profile: No results for input(s): INR, PROTIME in the last 168 hours. CBC:  Recent Labs Lab 04/22/16 1810 04/23/16 0436 04/24/16 0353  WBC 6.8 5.8 11.3*  HGB 15.8 14.3 14.3  HCT 46.9 44.2 45.3  MCV 103.1* 102.8* 103.4*  PLT 185 149* 147*   Cardiac Enzymes: No results for input(s): CKTOTAL, CKMB, CKMBINDEX, TROPONINI in the last 168 hours. BNP: Invalid input(s): POCBNP CBG: No results for input(s): GLUCAP in the last 168 hours. HbA1C: No results for input(s): HGBA1C in the last 72 hours. Urine analysis:    Component Value Date/Time   COLORURINE YELLOW 02/09/2016 [place] 02/09/2016 0553   LABSPEC >1.046 (H) 02/09/2016 0553   PHURINE 5.0 02/09/2016 0553   GLUCOSEU NEGATIVE 02/09/2016 0553   HGBUR NEGATIVE 02/09/2016 0553   BILIRUBINUR NEGATIVE 02/09/2016 0553   KETONESUR NEGATIVE 02/09/2016 0553   PROTEINUR NEGATIVE 02/09/2016 0553   UROBILINOGEN 0.2 04/25/2015 0245   NITRITE NEGATIVE 02/09/2016 0553   LEUKOCYTESUR NEGATIVE 02/09/2016 0553   Sepsis Labs: @LABRCNTIP (procalcitonin:4,lacticidven:4) ) Recent Results (from the past 240 hour(s))  C difficile quick scan w PCR reflex     Status: None   Collection Time: 04/24/16  7:53 PM  Result Value Ref Range Status   C Diff antigen NEGATIVE NEGATIVE Final   C Diff toxin NEGATIVE NEGATIVE Final   C Diff interpretation No C. difficile detected.  Final     Scheduled Meds: . carvedilol  6.25 mg Oral BID WC  . [START ON 04/26/2016] citalopram  40 mg Oral Daily  . folic acid  1 mg Oral Daily  . multivitamin with minerals  1 tablet Oral Daily  . nicotine  21 mg Transdermal Daily  . [START ON 04/26/2016] predniSONE  30 mg Oral Q breakfast  . rivaroxaban  20 mg Oral Q breakfast  . thiamine  100 mg Oral Daily  . traZODone  150 mg Oral QHS   Continuous Infusions:   Procedures/Studies: Ct Angio Chest Pe W And/or Wo Contrast  Result Date: 04/22/2016 CLINICAL DATA:  Shortness of breath for 3 days.  Hypoxia.  EXAM: CT ANGIOGRAPHY CHEST WITH CONTRAST TECHNIQUE: Multidetector CT imaging of the chest was performed using the standard protocol during bolus administration of intravenous contrast. Multiplanar CT image reconstructions and MIPs were obtained to evaluate the vascular anatomy. CONTRAST:  80 mL Isovue 370 IV COMPARISON:  CTA chest 02/08/2016.  Two-view chest x-ray 02/24/2016. FINDINGS: Cardiovascular: The heart size is normal. No significant pericardial effusion is present. Mediastinum/Nodes: Pulmonary arterial opacification is satisfactory. There are no focal filling defects to suggest pulmonary emboli. No significant mediastinal or axillary adenopathy is present. The thoracic inlet is incompletely imaged due to the patient moving prior to scan. Lungs/Pleura: This study is mildly degraded by patient motion. A calcified granuloma within the superior segment of the left lower lobe is stable. Linear atelectasis is present at the right lung base. There is some chronic scarring in the right lower lobe as  well. The lungs are otherwise clear. Upper Abdomen: Diffuse fatty infiltration of the liver is again noted. No discrete lesions are evident. Musculoskeletal: Bone windows are unremarkable. Vertebral body heights alignment are maintained. Ribs are unremarkable. The sternum is intact. Review of the MIP images confirms the above findings. IMPRESSION: 1. No pulmonary embolus. 2. Mild atelectasis and scarring. 3. Hepatic steatosis. Electronically Signed   By: San Morelle M.D.   On: 04/22/2016 21:40    Shaneese Tait, DO  [facility] Pager [phone]  If 7PM-7AM, please contact night-coverage [email or web address] Password [email or web address] 04/25/2016, 6:32 PM   LOS: 3 days

## 2016-04-26 DIAGNOSIS — Z86718 Personal history of other venous thrombosis and embolism: Secondary | ICD-10-CM

## 2016-04-26 MED ORDER — PREDNISONE 20 MG PO TABS
20.0000 mg | ORAL_TABLET | Freq: Every day | ORAL | Status: AC
  Administered 2016-04-27: 20 mg via ORAL
  Filled 2016-04-26: qty 1

## 2016-04-26 NOTE — Progress Notes (Signed)
PROGRESS NOTE  [name] OMV:[id number] DOB: 02-Dec-1965 DOA: 04/22/2016 PCP: No PCP Per Patient  Brief History: 50 year old male with a history of homelessness, depression, COPD, CAD, Lupus anticoagulant, DVT/PE presented with approximately one-week history of worsening shortness of breath and worsening cough with yellow sputum. He denies any fevers, chills, chest pain, hemoptysis, vomiting, diarrhea. He was noted to be tachycardic with respirations greater than 20 and oxygen saturation of 87% on room air in the emergency department. The patient was started on Solu-Medrol bronchodilators and admission was requested for further treatment. The patient also had suicidal ideations in emergency department. Psychiatric consult has been requested. They did not feel patient met criteria for inpatient psychiatric admission.  Assessment/Plan: Acute respiratory failure with hypoxia -Improved -Presently stable on room air -Secondary to COPD exacerbation  COPD exacerbation -Continue prednisone-->start weaning 04/25/16 -Continue bronchodilators -Pulmonary hygiene -Improving without antibiotics  Suicidal ideation -Psychiatry consult appreciated -Patient was recently discharged from [facility] in June 2017 -celexa increased to 40 mg daily -d/c sitter per psychiatry   Lupus anticoagulant with recurrent DVTs -has not been compliant with rivaroxaban due to financial restraints -hx of thrombectomy x 2 and IVC filter -restarted rivaroxaban  Diarrhea -Cdiff NEG  Alcohol Abuse -CIWA protocol -no signs of withdrawal  Tobacco abuse -cessation discussed  Social Issues/Homelessness -case management trying to set up rivaroxaban x 60-90 days and for 1 year thereafter, but pt will need to wait until 04/27/16 to pick up at [place]: Home on 8/21 Family Communication: NoFamily at bedside  Consultants: psychiatry  Code Status: FULL  DVT  Prophylaxis: rivaroxaban   Procedures: As Listed in Progress Note Above  Antibiotics: None   Procedures: As Listed in Progress Note Above  Antibiotics: None  Subjective: Patient denies fevers, chills, headache, chest pain, dyspnea, nausea, vomiting, diarrhea, abdominal pain, dysuria, hematuria, hematochezia, and melena.   Objective: Vitals:   04/25/16 1621 04/25/16 2137 04/26/16 0431 04/26/16 0940  BP: (!) 162/99 (!) 149/78 (!) 155/90 129/86  Pulse: (!) 57 (!) 54 60 69  Resp: 16 18 18 18   Temp: 98.1 F (36.7 C) 97.8 F (36.6 C) 97.8 F (36.6 C) 98.1 F (36.7 C)  TempSrc: Oral Oral Oral Oral  SpO2: 96% 98% 95% 96%  Weight:  95.5 kg (210 lb 8.6 oz)    Height:        Intake/Output Summary (Last 24 hours) at 04/26/16 1628 Last data filed at 04/26/16 1300  Gross per 24 hour  Intake             1640 ml  Output                0 ml  Net             1640 ml   Weight change:  Exam:   General:  Pt is alert, follows commands appropriately, not in acute distress  HEENT: No icterus, No thrush, No neck mass, [place]/AT  Cardiovascular: RRR, S1/S2, no rubs, no gallops  Respiratory: Bibasilar crackles. No wheezing. Good air movement.  Abdomen: Soft/+BS, non tender, non distended, no guarding  Extremities: No edema, No lymphangitis, No petechiae, No rashes, no synovitis   Data Reviewed: I have personally reviewed following labs and imaging studies Basic Metabolic Panel:  Recent Labs Lab 04/22/16 1853 04/23/16 0436 04/24/16 0353 04/25/16 0501  NA 141 135 140 139  K 4.1 3.8 3.3* 3.6  CL 111 104 104 107  CO2 19* 21* 26 26  GLUCOSE 116* 197* 83 94  BUN <5* <5* 8 7  CREATININE 0.65 0.62 0.68 0.67  CALCIUM 8.1* 8.5* 8.7* 8.6*  MG  --   --   --  1.9   Liver Function Tests:  Recent Labs Lab 04/22/16 1853  AST 85*  ALT 66*  ALKPHOS 89  BILITOT 0.3  PROT 6.8  ALBUMIN 3.3*   No results for input(s): LIPASE, AMYLASE in the last 168 hours. No results  for input(s): AMMONIA in the last 168 hours. Coagulation Profile: No results for input(s): INR, PROTIME in the last 168 hours. CBC:  Recent Labs Lab 04/22/16 1810 04/23/16 0436 04/24/16 0353  WBC 6.8 5.8 11.3*  HGB 15.8 14.3 14.3  HCT 46.9 44.2 45.3  MCV 103.1* 102.8* 103.4*  PLT 185 149* 147*   Cardiac Enzymes: No results for input(s): CKTOTAL, CKMB, CKMBINDEX, TROPONINI in the last 168 hours. BNP: Invalid input(s): POCBNP CBG: No results for input(s): GLUCAP in the last 168 hours. HbA1C: No results for input(s): HGBA1C in the last 72 hours. Urine analysis:    Component Value Date/Time   COLORURINE YELLOW 02/09/2016 [place] 02/09/2016 0553   LABSPEC >1.046 (H) 02/09/2016 0553   PHURINE 5.0 02/09/2016 0553   GLUCOSEU NEGATIVE 02/09/2016 0553   HGBUR NEGATIVE 02/09/2016 0553   BILIRUBINUR NEGATIVE 02/09/2016 0553   KETONESUR NEGATIVE 02/09/2016 0553   PROTEINUR NEGATIVE 02/09/2016 0553   UROBILINOGEN 0.2 04/25/2015 0245   NITRITE NEGATIVE 02/09/2016 0553   LEUKOCYTESUR NEGATIVE 02/09/2016 0553   Sepsis Labs: @LABRCNTIP (procalcitonin:4,lacticidven:4) ) Recent Results (from the past 240 hour(s))  C difficile quick scan w PCR reflex     Status: None   Collection Time: 04/24/16  7:53 PM  Result Value Ref Range Status   C Diff antigen NEGATIVE NEGATIVE Final   C Diff toxin NEGATIVE NEGATIVE Final   C Diff interpretation No C. difficile detected.  Final     Scheduled Meds: . carvedilol  6.25 mg Oral BID WC  . citalopram  40 mg Oral Daily  . folic acid  1 mg Oral Daily  . multivitamin with minerals  1 tablet Oral Daily  . nicotine  21 mg Transdermal Daily  . predniSONE  30 mg Oral Q breakfast  . rivaroxaban  20 mg Oral Q breakfast  . thiamine  100 mg Oral Daily  . traZODone  150 mg Oral QHS   Continuous Infusions:   Procedures/Studies: Ct Angio Chest Pe W And/or Wo Contrast  Result Date: 04/22/2016 CLINICAL DATA:  Shortness of breath for 3  days.  Hypoxia. EXAM: CT ANGIOGRAPHY CHEST WITH CONTRAST TECHNIQUE: Multidetector CT imaging of the chest was performed using the standard protocol during bolus administration of intravenous contrast. Multiplanar CT image reconstructions and MIPs were obtained to evaluate the vascular anatomy. CONTRAST:  80 mL Isovue 370 IV COMPARISON:  CTA chest 02/08/2016.  Two-view chest x-ray 02/24/2016. FINDINGS: Cardiovascular: The heart size is normal. No significant pericardial effusion is present. Mediastinum/Nodes: Pulmonary arterial opacification is satisfactory. There are no focal filling defects to suggest pulmonary emboli. No significant mediastinal or axillary adenopathy is present. The thoracic inlet is incompletely imaged due to the patient moving prior to scan. Lungs/Pleura: This study is mildly degraded by patient motion. A calcified granuloma within the superior segment of the left lower lobe is stable. Linear atelectasis is present at the right lung base. There is some chronic scarring in the right lower lobe as well. The  lungs are otherwise clear. Upper Abdomen: Diffuse fatty infiltration of the liver is again noted. No discrete lesions are evident. Musculoskeletal: Bone windows are unremarkable. Vertebral body heights alignment are maintained. Ribs are unremarkable. The sternum is intact. Review of the MIP images confirms the above findings. IMPRESSION: 1. No pulmonary embolus. 2. Mild atelectasis and scarring. 3. Hepatic steatosis. Electronically Signed   By: San Morelle M.D.   On: 04/22/2016 21:40    [name] Hoppes, DO  [facility] Pager [phone]  If 7PM-7AM, please contact night-coverage [email or web address] Password [email or web address] 04/26/2016, 4:28 PM   LOS: 4 days

## 2016-04-26 NOTE — Progress Notes (Signed)
CM met with patient at bedside discussed discharge planning. Patient needs medications prior to discharge, the [facility] has arranged to provide Xarelto, clinic is currently closed patient can pick up medication tomorrow morning 8/21 at the [facility] pharmacy. 

## 2016-04-27 MED ORDER — RIVAROXABAN 20 MG PO TABS: 20.0000 mg | ORAL_TABLET | Freq: Every day | ORAL | 11 refills | Status: DC

## 2016-04-27 MED ORDER — CITALOPRAM HYDROBROMIDE 40 MG PO TABS: 40.0000 mg | ORAL_TABLET | Freq: Every day | ORAL | 1 refills | Status: DC

## 2016-04-27 MED FILL — **XARELTO 20 MG TABLET: 20 MG | 28 days supply | Qty: 28 | Fill #0

## 2016-04-27 NOTE — Discharge Summary (Signed)
Physician Discharge Summary  Rorey Bisson GXQ:[id number] DOB: 1966/05/30 DOA: 04/22/2016  PCP: No PCP Per Patient  Admit date: 04/22/2016 Discharge date: 04/27/2016  Admitted From: Home Disposition:  Home  Recommendations for Outpatient Follow-up:  1. Follow up with PCP in 1-2 weeks 2. Please obtain BMP/CBC in one week   Home Health: no Equipment/Devices: none  Discharge Condition: stable CODE STATUS: FULL Diet recommendation: Heart Healthy    Brief/Interim Summary: 50 year old male with a history of homelessness, depression, COPD, CAD, Lupus anticoagulant, DVT/PE presented with approximately one-week history of worsening shortness of breath and worsening cough with yellow sputum. He denies any fevers, chills, chest pain, hemoptysis, vomiting, diarrhea. He was noted to be tachycardic with respirations greater than 20 and oxygen saturation of 87% on room air in the emergency department. The patient was started on Solu-Medrol bronchodilators and admission was requested for further treatment. The patient also had suicidal ideations in emergency department. Psychiatric consult has been requested. They did not feel patient met criteria for inpatient psychiatric admission.  Discharge Diagnoses:  Acute respiratory failure with hypoxia -Improved -Presently stable on room air -Secondary to COPD exacerbation  COPD exacerbation -Continue prednisone-->start weaning 04/25/16-->wean off prior to discharge -Continue bronchodilators -Pulmonary hygiene -Improving without antibiotics  Suicidal ideation -Psychiatry consult appreciated -Patient was recently discharged from [facility] in June 2017 -celexa increased to 40 mg daily -d/c sitter per psychiatry  Lupus anticoagulant with recurrent DVTs -has not been compliant with rivaroxaban due to financial restraints -hx of thrombectomy x 2 and IVC filter -restarted rivaroxaban  Diarrhea -Cdiff NEG  Alcohol Abuse -CIWA protocol -no signs of  withdrawal  Tobacco abuse -cessation discussed  Social Issues/Homelessness -case managementset up rivaroxaban x 60 days and for 1 year thereafter -he has appointment at [facility] on 05/06/16 @ 9AM at which time he will be given instructions how and when he can pick up his Xarelto each month at no charge--pt has been informed of importance of not missing appointments and he expressed understanding   Discharge Instructions  Discharge Instructions    Diet - low sodium heart healthy    Complete by:  As directed   Increase activity slowly    Complete by:  As directed       Medication List    TAKE these medications   carvedilol 6.25 MG tablet Commonly known as:  COREG Take 1 tablet (6.25 mg total) by mouth 2 (two) times daily with a meal. For high blood pressure   citalopram 40 MG tablet Commonly known as:  CELEXA Take 1 tablet (40 mg total) by mouth daily. What changed:  medication strength  how much to take  additional instructions   diclofenac sodium 1 % Gel Commonly known as:  VOLTAREN Apply 2 g topically 4 (four) times daily. For arthritic pain   folic acid 1 MG tablet Commonly known as:  FOLVITE Take 1 tablet (1 mg total) by mouth daily. For low folate   hydrOXYzine 25 MG tablet Commonly known as:  ATARAX/VISTARIL Take 1 tablet (25 mg total) by mouth 3 (three) times daily as needed for anxiety.   nicotine 21 mg/24hr patch Commonly known as:  NICODERM CQ - dosed in mg/24 hours Place 1 patch (21 mg total) onto the skin daily at 6 (six) AM. For smoking cessation   rivaroxaban 20 MG Tabs tablet Commonly known as:  XARELTO Take 1 tablet (20 mg total) by mouth daily with breakfast. What changed:  additional instructions   thiamine 100 MG  tablet Take 1 tablet (100 mg total) by mouth daily. For low Thiamine   traZODone 150 MG tablet Commonly known as:  DESYREL Take 1 tablet (150 mg total) by mouth at bedtime. For sleep      Follow-up  Information    [place] .   Why:  Pick up Xarelto on Monday April 27, 2016.  Appointment May 06, 2016 at [place]. Please call if you are unable to keep this appointment. Contact information: [place] [address] [phone]         No Known Allergies  Consultations:  psychiatry   Procedures/Studies: Ct Angio Chest Pe W And/or Wo Contrast  Result Date: 04/22/2016 CLINICAL DATA:  Shortness of breath for 3 days.  Hypoxia. EXAM: CT ANGIOGRAPHY CHEST WITH CONTRAST TECHNIQUE: Multidetector CT imaging of the chest was performed using the standard protocol during bolus administration of intravenous contrast. Multiplanar CT image reconstructions and MIPs were obtained to evaluate the vascular anatomy. CONTRAST:  80 mL Isovue 370 IV COMPARISON:  CTA chest 02/08/2016.  Two-view chest x-ray 02/24/2016. FINDINGS: Cardiovascular: The heart size is normal. No significant pericardial effusion is present. Mediastinum/Nodes: Pulmonary arterial opacification is satisfactory. There are no focal filling defects to suggest pulmonary emboli. No significant mediastinal or axillary adenopathy is present. The thoracic inlet is incompletely imaged due to the patient moving prior to scan. Lungs/Pleura: This study is mildly degraded by patient motion. A calcified granuloma within the superior segment of the left lower lobe is stable. Linear atelectasis is present at the right lung base. There is some chronic scarring in the right lower lobe as well. The lungs are otherwise clear. Upper Abdomen: Diffuse fatty infiltration of the liver is again noted. No discrete lesions are evident. Musculoskeletal: Bone windows are unremarkable. Vertebral body heights alignment are maintained. Ribs are unremarkable. The sternum is intact. Review of the MIP images confirms the above findings. IMPRESSION: 1. No pulmonary embolus. 2. Mild atelectasis and scarring. 3. Hepatic  steatosis. Electronically Signed   By: San Morelle M.D.   On: 04/22/2016 21:40        Discharge Exam: Vitals:   04/26/16 2030 04/27/16 0642  BP: 137/86 125/76  Pulse: 62 67  Resp: 18 18  Temp: 98.1 F (36.7 C) 98 F (36.7 C)   Vitals:   04/26/16 0940 04/26/16 1700 04/26/16 2030 04/27/16 0642  BP: 129/86 (!) 148/84 137/86 125/76  Pulse: 69 (!) 57 62 67  Resp: _0 Temp: 98.1 F (36.7 C) 97.8 F (36.6 C) 98.1 F (36.7 C) 98 F (36.7 C)  TempSrc: Oral Oral Oral Oral  SpO2: 96% 97% 96% 97%  Weight:    95.4 kg (210 lb 5.1 oz)  Height:        General: Pt is alert, awake, not in acute distress Cardiovascular: RRR, S1/S2 +, no rubs, no gallops Respiratory: CTA bilaterally, no wheezing, no rhonchi Abdominal: Soft, NT, ND, bowel sounds + Extremities: no edema, no cyanosis   The results of significant diagnostics from this hospitalization (including imaging, microbiology, ancillary and laboratory) are listed below for reference.    Significant Diagnostic Studies: Ct Angio Chest Pe W And/or Wo Contrast  Result Date: 04/22/2016 CLINICAL DATA:  Shortness of breath for 3 days.  Hypoxia. EXAM: CT ANGIOGRAPHY CHEST WITH CONTRAST TECHNIQUE: Multidetector CT imaging of the chest was performed using the standard protocol during bolus administration of intravenous contrast. Multiplanar CT image reconstructions and MIPs were  obtained to evaluate the vascular anatomy. CONTRAST:  80 mL Isovue 370 IV COMPARISON:  CTA chest 02/08/2016.  Two-view chest x-ray 02/24/2016. FINDINGS: Cardiovascular: The heart size is normal. No significant pericardial effusion is present. Mediastinum/Nodes: Pulmonary arterial opacification is satisfactory. There are no focal filling defects to suggest pulmonary emboli. No significant mediastinal or axillary adenopathy is present. The thoracic inlet is incompletely imaged due to the patient moving prior to scan. Lungs/Pleura: This study is mildly  degraded by patient motion. A calcified granuloma within the superior segment of the left lower lobe is stable. Linear atelectasis is present at the right lung base. There is some chronic scarring in the right lower lobe as well. The lungs are otherwise clear. Upper Abdomen: Diffuse fatty infiltration of the liver is again noted. No discrete lesions are evident. Musculoskeletal: Bone windows are unremarkable. Vertebral body heights alignment are maintained. Ribs are unremarkable. The sternum is intact. Review of the MIP images confirms the above findings. IMPRESSION: 1. No pulmonary embolus. 2. Mild atelectasis and scarring. 3. Hepatic steatosis. Electronically Signed   By: Christopher  Mattern M.D.   On: 04/22/2016 21:40     Microbiology: Recent Results (from the past 240 hour(s))  C difficile quick scan w PCR reflex     Status: None   Collection Time: 04/24/16  7:53 PM  Result Value Ref Range Status   C Diff antigen NEGATIVE NEGATIVE Final   C Diff toxin NEGATIVE NEGATIVE Final   C Diff interpretation No C. difficile detected.  Final     Labs: Basic Metabolic Panel:  Recent Labs Lab 04/22/16 1853 04/23/16 0436 04/24/16 0353 04/25/16 0501  NA 141 135 140 139  K 4.1 3.8 3.3* 3.6  CL 111 104 104 107  CO2 19* 21* 26 26  GLUCOSE 116* 197* 83 94  BUN <5* <5* 8 7  CREATININE 0.65 0.62 0.68 0.67  CALCIUM 8.1* 8.5* 8.7* 8.6*  MG  --   --   --  1.9   Liver Function Tests:  Recent Labs Lab 04/22/16 1853  AST 85*  ALT 66*  ALKPHOS 89  BILITOT 0.3  PROT 6.8  ALBUMIN 3.3*   No results for input(s): LIPASE, AMYLASE in the last 168 hours. No results for input(s): AMMONIA in the last 168 hours. CBC:  Recent Labs Lab 04/22/16 1810 04/23/16 0436 04/24/16 0353  WBC 6.8 5.8 11.3*  HGB 15.8 14.3 14.3  HCT 46.9 44.2 45.3  MCV 103.1* 102.8* 103.4*  PLT 185 149* 147*   Cardiac Enzymes: No results for input(s): CKTOTAL, CKMB, CKMBINDEX, TROPONINI in the last 168  hours. BNP: Invalid input(s): POCBNP CBG: No results for input(s): GLUCAP in the last 168 hours.  Time coordinating discharge:  Greater than 30 minutes  Signed:  , , DO [facility] Pager: [phone] 04/27/2016, 12:59 PM   

## 2016-04-27 NOTE — Progress Notes (Signed)
Pt being discharged home via bus transportation. Pt alert and oriented x4. VSS. Pt c/o no pain at this time. No signs of respiratory distress. Education complete and care plans resolved. IVx2 removed with catheter intact and pt tolerated well. No further issues at this time. Pt to follow up with PCP. Leanne Chang, RN

## 2016-05-06 ENCOUNTER — Other Ambulatory Visit: Payer: Self-pay | Admitting: *Deleted

## 2016-05-06 ENCOUNTER — Inpatient Hospital Stay: Payer: Self-pay | Admitting: Critical Care Medicine

## 2016-05-06 MED ORDER — RIVAROXABAN 20 MG PO TABS: 20.0000 mg | ORAL_TABLET | Freq: Every day | ORAL | 3 refills | Status: DC

## 2016-05-06 NOTE — Telephone Encounter (Signed)
Printed for [employer]

## 2016-05-21 ENCOUNTER — Inpatient Hospital Stay: Payer: Self-pay

## 2016-05-30 ENCOUNTER — Emergency Department (HOSPITAL_COMMUNITY): Payer: Self-pay

## 2016-05-30 ENCOUNTER — Encounter (HOSPITAL_COMMUNITY): Payer: Self-pay | Admitting: *Deleted

## 2016-05-30 ENCOUNTER — Observation Stay (HOSPITAL_COMMUNITY)
Admission: EM | Admit: 2016-05-30 | Discharge: 2016-06-02 | Disposition: A | Discharge status: Home / Self Care | Payer: Self-pay | Attending: Emergency Medicine | Admitting: Emergency Medicine

## 2016-05-30 ENCOUNTER — Encounter (HOSPITAL_COMMUNITY)
Admission: EM | Disposition: A | Discharge status: Home / Self Care | Payer: Self-pay | Source: Home / Self Care | Attending: Emergency Medicine

## 2016-05-30 DIAGNOSIS — R079 Chest pain, unspecified: Secondary | ICD-10-CM | POA: Diagnosis present

## 2016-05-30 DIAGNOSIS — T82898A Other specified complication of vascular prosthetic devices, implants and grafts, initial encounter: Secondary | ICD-10-CM

## 2016-05-30 DIAGNOSIS — F10920 Alcohol use, unspecified with intoxication, uncomplicated: Secondary | ICD-10-CM

## 2016-05-30 DIAGNOSIS — F10120 Alcohol abuse with intoxication, uncomplicated: Secondary | ICD-10-CM | POA: Insufficient documentation

## 2016-05-30 DIAGNOSIS — F17210 Nicotine dependence, cigarettes, uncomplicated: Secondary | ICD-10-CM | POA: Insufficient documentation

## 2016-05-30 DIAGNOSIS — Z720 Tobacco use: Secondary | ICD-10-CM | POA: Diagnosis present

## 2016-05-30 DIAGNOSIS — Z7982 Long term (current) use of aspirin: Secondary | ICD-10-CM | POA: Insufficient documentation

## 2016-05-30 DIAGNOSIS — I82402 Acute embolism and thrombosis of unspecified deep veins of left lower extremity: Secondary | ICD-10-CM | POA: Diagnosis present

## 2016-05-30 DIAGNOSIS — M545 Low back pain, unspecified: Secondary | ICD-10-CM

## 2016-05-30 DIAGNOSIS — R55 Syncope and collapse: Secondary | ICD-10-CM

## 2016-05-30 DIAGNOSIS — J449 Chronic obstructive pulmonary disease, unspecified: Secondary | ICD-10-CM | POA: Diagnosis present

## 2016-05-30 DIAGNOSIS — J441 Chronic obstructive pulmonary disease with (acute) exacerbation: Principal | ICD-10-CM | POA: Insufficient documentation

## 2016-05-30 DIAGNOSIS — F1020 Alcohol dependence, uncomplicated: Secondary | ICD-10-CM | POA: Diagnosis present

## 2016-05-30 DIAGNOSIS — R52 Pain, unspecified: Secondary | ICD-10-CM

## 2016-05-30 DIAGNOSIS — Z7901 Long term (current) use of anticoagulants: Secondary | ICD-10-CM | POA: Insufficient documentation

## 2016-05-30 DIAGNOSIS — I2510 Atherosclerotic heart disease of native coronary artery without angina pectoris: Secondary | ICD-10-CM | POA: Insufficient documentation

## 2016-05-30 LAB — BASIC METABOLIC PANEL
ANION GAP: 9 (ref 5–15)
BUN: <5 mg/dL — ABNORMAL LOW (ref 6–20)
CALCIUM: 8.2 mg/dL — AB (ref 8.9–10.3)
CO2: 22 mmol/L (ref 22–32)
Chloride: 108 mmol/L (ref 101–111)
Creatinine, Ser: 0.64 mg/dL (ref 0.61–1.24)
Glucose, Bld: 89 mg/dL (ref 65–99)
POTASSIUM: 3.9 mmol/L (ref 3.5–5.1)
SODIUM: 139 mmol/L (ref 135–145)

## 2016-05-30 LAB — CBC WITH DIFFERENTIAL/PLATELET
BASOS ABS: 0.1 10*3/uL (ref 0.0–0.1)
BASOS PCT: 1 %
Eosinophils Absolute: 0.1 10*3/uL (ref 0.0–0.7)
Eosinophils Relative: 2 %
HEMATOCRIT: 40.8 % (ref 39.0–52.0)
HEMOGLOBIN: 13.6 g/dL (ref 13.0–17.0)
Lymphocytes Relative: 30 %
Lymphs Abs: 1.6 10*3/uL (ref 0.7–4.0)
MCH: 33.8 pg (ref 26.0–34.0)
MCHC: 33.3 g/dL (ref 30.0–36.0)
MCV: 101.5 fL — ABNORMAL HIGH (ref 78.0–100.0)
MONOS PCT: 9 %
Monocytes Absolute: 0.5 10*3/uL (ref 0.1–1.0)
NEUTROS ABS: 3.2 10*3/uL (ref 1.7–7.7)
NEUTROS PCT: 58 %
Platelets: 102 10*3/uL — ABNORMAL LOW (ref 150–400)
RBC: 4.02 MIL/uL — AB (ref 4.22–5.81)
RDW: 15.4 % (ref 11.5–15.5)
WBC: 5.5 10*3/uL (ref 4.0–10.5)

## 2016-05-30 LAB — APTT: APTT: 28 s (ref 24–36)

## 2016-05-30 LAB — I-STAT CG4 LACTIC ACID, ED: Lactic Acid, Venous: 2.21 mmol/L (ref 0.5–1.9)

## 2016-05-30 LAB — TYPE AND SCREEN
ABO/RH(D): A POS
Antibody Screen: NEGATIVE

## 2016-05-30 LAB — PROTIME-INR
INR: 1
PROTHROMBIN TIME: 13.2 s (ref 11.4–15.2)

## 2016-05-30 LAB — I-STAT TROPONIN, ED: Troponin i, poc: 0 ng/mL (ref 0.00–0.08)

## 2016-05-30 LAB — ETHANOL: Alcohol, Ethyl (B): 261 mg/dL — ABNORMAL HIGH (ref <5)

## 2016-05-30 SURGERY — INVASIVE LAB ABORTED CASE

## 2016-05-30 MED ORDER — IPRATROPIUM-ALBUTEROL 0.5-2.5 (3) MG/3ML IN SOLN: 3.0000 mL | Freq: Once | RESPIRATORY_TRACT | Status: DC

## 2016-05-30 MED ORDER — ACETAMINOPHEN 500 MG PO TABS
1000.0000 mg | ORAL_TABLET | Freq: Once | ORAL | Status: AC
  Administered 2016-05-30: 1000 mg via ORAL
  Filled 2016-05-30: qty 2

## 2016-05-30 MED ORDER — IPRATROPIUM-ALBUTEROL 0.5-2.5 (3) MG/3ML IN SOLN
3.0000 mL | Freq: Once | RESPIRATORY_TRACT | Status: AC
  Administered 2016-05-30: 3 mL via RESPIRATORY_TRACT
  Filled 2016-05-30: qty 3

## 2016-05-30 MED ORDER — IOPAMIDOL (ISOVUE-370) INJECTION 76%
100.0000 mL | Freq: Once | INTRAVENOUS | Status: AC | PRN
  Administered 2016-05-30: 100 mL via INTRAVENOUS

## 2016-05-30 MED ORDER — SODIUM CHLORIDE 0.9 % IV BOLUS (SEPSIS)
1000.0000 mL | Freq: Once | INTRAVENOUS | Status: AC
  Administered 2016-05-30: 1000 mL via INTRAVENOUS

## 2016-05-30 MED ORDER — OXYCODONE HCL 5 MG PO TABS
10.0000 mg | ORAL_TABLET | Freq: Once | ORAL | Status: AC
  Administered 2016-05-30: 10 mg via ORAL
  Filled 2016-05-30: qty 2

## 2016-05-30 MED ORDER — PREDNISONE 20 MG PO TABS
60.0000 mg | ORAL_TABLET | Freq: Once | ORAL | Status: AC
  Administered 2016-05-30: 60 mg via ORAL
  Filled 2016-05-30: qty 3

## 2016-05-30 MED ORDER — SODIUM CHLORIDE 0.9 % IV BOLUS (SEPSIS)
500.0000 mL | Freq: Once | INTRAVENOUS | Status: AC
  Administered 2016-05-30: 500 mL via INTRAVENOUS

## 2016-05-30 SURGICAL SUPPLY — 6 items
KIT ENCORE 26 ADVANTAGE (KITS) IMPLANT
KIT HEART LEFT (KITS) IMPLANT
PACK CARDIAC CATHETERIZATION (CUSTOM PROCEDURE TRAY) IMPLANT
SYR MEDRAD MARK V 150ML (SYRINGE) IMPLANT
TRANSDUCER W/STOPCOCK (MISCELLANEOUS) IMPLANT
TUBING CIL FLEX 10 FLL-RA (TUBING) IMPLANT

## 2016-05-30 NOTE — ED Notes (Signed)
Pt became very upset when xray came to bedside. Pt began yanking off his monitoring equipment and states, "You better give me some pain medicine!". Xray left bedside. Chrislyn, RN helped diffuse the situation and got pt to stop screaming.

## 2016-05-30 NOTE — ED Triage Notes (Signed)
Pt arrives from home via [facility]. Pt states he has been having intermittent CP x3 days that's in the left chest, radiates to the left arm and into the back. Pt also has c/o sob and nausea. Pt states he stopped taking his xarelto 3 days ago rt having persistent nosebleeds. Pt states he has had multiple syncopal episodes x several days as well. Pt has a hx of DVT and MI. ST elevation noted on EKG. CARDS D/C STEMI.

## 2016-05-30 NOTE — ED Provider Notes (Signed)
[facility] DEPT Provider Note   CSN: [id number] Arrival date & time: 05/30/16  1714     History   Chief Complaint Chief Complaint  Patient presents with  . Chest Pain    HPI Willmer Hilton is a 50 y.o. male.  HPI 50 year old male who presents with chest pain. He has a history of COPD, coronary artery disease, collagen vascular disease, DVT and PE on Xarelto, and lupus anticoagulant disorder. States onset of intermittent chest pain over the past 3 days associated with nosebleeds whenever he coughed. He states that he stopped taking his Xarelto 3 days ago due to his nosebleeds. This morning had stabbing throbbing left-sided chest pain that radiated towards the back. Associated with shortness of breath, nausea and vomiting. He ultimately called EMS who activated a code STEMI in route. Code STEMI was canceled by cardiologist who reviewed his EKG and did not feel that his findings were suggestive of acute ST elevation MI. Patient denies any lower extremity edema or pain. Complains of also on commitment low back pain starting today but no abdominal pain. I has also been complaining of intermittent episodes of syncope over the past 3 days as well. States that he would get up and walk, and then to wake up on the ground. He received a full dose of aspirin and 2 nitroglycerin in route. States that nitroglycerin relieved his pain completely, and he is chest pain-free.  Past Medical History:  Diagnosis Date  . Collagen vascular disease ([place])   . COPD (chronic obstructive pulmonary disease) ([place])   . Coronary artery disease   . Degenerative joint disease   . Degenerative joint disease of spine   . Depression   . DVT (deep venous thrombosis) ([place])   . ETOH abuse   . Hepatitis B   . Lupus anticoagulant disorder ([place]) 02/02/2012  . Pulmonary embolism ([place]) 06/2010    Patient Active Problem List   Diagnosis Date Noted  . Chest pain 05/30/2016  . History of DVT (deep vein thrombosis)   . Left  leg swelling   . COPD exacerbation ([place]) 04/22/2016  . Uncomplicated alcohol dependence ([place])   . Severe episode of recurrent major depressive disorder, without psychotic features ([place])   . Acute respiratory failure with hypoxia ([place]) 02/09/2016  . Alcohol use disorder, severe, dependence ([place]) 02/09/2016  . Major depressive disorder, recurrent, severe without psychotic features ([place])   . Alcohol abuse with intoxication ([place]) 02/10/2015  . Alcohol dependence with withdrawal, uncomplicated ([place]) [id number]  . Substance induced mood disorder ([place]) 02/09/2015  . Suicidal ideations 02/09/2015  . Alcohol dependence with alcohol-induced mood disorder ([place])   . Suicidal ideation   . GAD (generalized anxiety disorder) 09/18/2014  . Recurrent major depression-severe ([place]) 09/18/2014  . Alcohol abuse with alcohol-induced mood disorder ([place]) 09/17/2014  . Alcohol intoxication ([place])   . Left leg DVT ([place]) 06/06/2014  . Alcohol dependence ([place]) 01/27/2014  . Upper leg DVT (deep venous thromboembolism), acute ([place]) 06/12/2013  . Homeless single person 06/12/2013  . 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 Subtherapeutic international normalized ratio (INR) 12/18/2012  . Alcohol withdrawal ([place]) 11/03/2012  . Major depressive disorder, recurrent episode, severe ([place]) 11/03/2012  . Deep vein thrombosis ([place]) 04/13/2012  . Lupus anticoagulant disorder ([place]) 02/02/2012  . Degenerative joint disease   . Alcohol abuse 03/01/2011  . Tobacco abuse 03/01/2011    Past Surgical History:  Procedure Laterality Date  . LEFT ELBOW SURGERY    . TONSILLECTOMY  Home Medications    Prior to Admission medications   Medication Sig Start Date End Date Taking? Authorizing Provider  Aspirin-Salicylamide-Caffeine (BC FAST PAIN RELIEF) 650-195-33.3 MG PACK Take 1 Package by mouth daily as needed (pain).   Yes Historical Provider, MD  Ca Carbonate-Mag Hydroxide (ROLAIDS) 550-110 MG CHEW Chew 2 each by mouth  daily as needed (indigestion).   Yes Historical Provider, MD  rivaroxaban (XARELTO) 20 MG TABS tablet Take 1 tablet (20 mg total) by mouth daily with breakfast. 05/06/16  Yes Elsie Stain, MD  carvedilol (COREG) 6.25 MG tablet Take 1 tablet (6.25 mg total) by mouth 2 (two) times daily with a meal. For high blood pressure Patient not taking: Reported on 05/30/2016 02/24/16   Encarnacion Slates, NP  citalopram (CELEXA) 40 MG tablet Take 1 tablet (40 mg total) by mouth daily. Patient not taking: Reported on 05/30/2016 04/27/16   Orson Eva, MD  diclofenac sodium (VOLTAREN) 1 % GEL Apply 2 g topically 4 (four) times daily. For arthritic pain Patient not taking: Reported on 05/30/2016 02/24/16   Encarnacion Slates, NP  folic acid (FOLVITE) 1 MG tablet Take 1 tablet (1 mg total) by mouth daily. For low folate Patient not taking: Reported on 05/30/2016 02/24/16   Encarnacion Slates, NP  hydrOXYzine (ATARAX/VISTARIL) 25 MG tablet Take 1 tablet (25 mg total) by mouth 3 (three) times daily as needed for anxiety. Patient not taking: Reported on 05/30/2016 02/24/16   Encarnacion Slates, NP  nicotine (NICODERM CQ - DOSED IN MG/24 HOURS) 21 mg/24hr patch Place 1 patch (21 mg total) onto the skin daily at 6 (six) AM. For smoking cessation Patient not taking: Reported on 05/30/2016 02/24/16   Encarnacion Slates, NP  thiamine 100 MG tablet Take 1 tablet (100 mg total) by mouth daily. For low Thiamine Patient not taking: Reported on 05/30/2016 02/24/16   Encarnacion Slates, NP  traZODone (DESYREL) 150 MG tablet Take 1 tablet (150 mg total) by mouth at bedtime. For sleep Patient not taking: Reported on 05/30/2016 02/24/16   Encarnacion Slates, NP    Family History Family History  Problem Relation Age of Onset  . Cancer Mother     Ovarian    Social History Social History  Substance Use Topics  . Smoking status: Current Every Day Smoker    Packs/day: 2.00    Years: 30.00    Types: Cigarettes  . Smokeless tobacco: Never Used  . Alcohol use 14.4  oz/week    24 Cans of beer per week     Comment: cas of beer and fifth of vodka daily     Allergies   Xarelto [rivaroxaban]   Review of Systems Review of Systems 10/14 systems reviewed and are negative other than those stated in the HPI   Physical Exam Updated Vital Signs BP 106/63   Pulse 95   Temp 98.2 F (36.8 C) (Oral)   Resp 18   Ht 6\' 2"  (1.88 m)   Wt 220 lb (99.8 kg)   SpO2 92%   BMI 28.25 kg/m   Physical Exam Physical Exam  Nursing note and vitals reviewed. Constitutional: non-toxic, and in no acute distress Head: Normocephalic and atraumatic.  Mouth/Throat: Oropharynx is clear and moist.  Neck: Normal range of motion. Neck supple.  Cardiovascular: Normal rate and regular rhythm.  +2 symmetric pulses in bilateral upper and lower extremities. No edema Pulmonary/Chest: Effort normal and breath sounds normal.  Abdominal: Soft. There is no tenderness. There  is no rebound and no guarding.  Musculoskeletal: Normal range of motion.  Neurological: Alert, no facial droop, fluent speech, moves all extremities symmetrically Skin: Skin is warm and dry.  Psychiatric: Cooperative   ED Treatments / Results  Labs (all labs ordered are listed, but only abnormal results are displayed) Labs Reviewed  CBC WITH DIFFERENTIAL/PLATELET - Abnormal; Notable for the following:       Result Value   RBC 4.02 (*)    MCV 101.5 (*)    Platelets 102 (*)    All other components within normal limits  BASIC METABOLIC PANEL - Abnormal; Notable for the following:    BUN <5 (*)    Calcium 8.2 (*)    All other components within normal limits  ETHANOL - Abnormal; Notable for the following:    Alcohol, Ethyl (B) 261 (*)    All other components within normal limits  I-STAT CG4 LACTIC ACID, ED - Abnormal; Notable for the following:    Lactic Acid, Venous 2.21 (*)    All other components within normal limits  PROTIME-INR  APTT  I-STAT TROPOININ, ED  TYPE AND SCREEN    EKG  EKG  Interpretation  Date/Time:  Saturday May 30 2016 17:34:30 EDT Ventricular Rate:  78 PR Interval:    QRS Duration: 86 QT Interval:  397 QTC Calculation: 453 R Axis:   91 Text Interpretation:  Sinus rhythm Borderline right axis deviation ST elev, probable normal early repol pattern similar to prior EKG 11/2010 Confirmed by Raheem Kolbe MD, Melea Prezioso [phone]) on 05/30/2016 5:39:32 PM       Radiology Ct Angio Chest/abd/pel For Dissection W And/or Wo Contrast  Result Date: 05/30/2016 CLINICAL DATA:  Acute onset of generalized chest pain and back pain. Dyspnea and syncope. Patient on Xarelto. Initial encounter. EXAM: CT ANGIOGRAPHY CHEST, ABDOMEN AND PELVIS TECHNIQUE: Multidetector CT imaging through the chest, abdomen and pelvis was performed using the standard protocol during bolus administration of intravenous contrast. Multiplanar reconstructed images and MIPs were obtained and reviewed to evaluate the vascular anatomy. CONTRAST:  100 mL of Isovue 370 IV contrast COMPARISON:  CTA of the chest performed 04/22/2016, and CT of the abdomen and pelvis from 05/03/2015 FINDINGS: CTA CHEST FINDINGS Cardiovascular: There is no evidence of aortic dissection. There is no evidence of aneurysmal dilatation. Minimal calcification is seen at the proximal left subclavian artery. Mild coronary artery calcification is noted. No additional calcific atherosclerotic disease is seen. The great vessels are otherwise unremarkable in appearance. There is no evidence of significant pulmonary embolus. Mediastinum/Nodes: The mediastinum is unremarkable in appearance. No mediastinal lymphadenopathy is seen. No pericardial effusion is identified. The thyroid gland is unremarkable. No axillary lymphadenopathy is appreciated. Lungs/Pleura: Minimal scarring is noted at the lung bases. The lungs are otherwise clear bilaterally. A calcified granuloma is noted at superior aspect of the left lower lobe. No pleural effusion or pneumothorax is seen.  No masses are identified. Musculoskeletal: No acute osseous abnormalities are identified. The visualized musculature is unremarkable in appearance. Review of the MIP images confirms the above findings. CTA ABDOMEN AND PELVIS FINDINGS VASCULAR Aorta: Mild scattered calcification is seen along the abdominal aorta. The abdominal aorta remains fully patent. Celiac: The celiac trunk is patent and unremarkable in appearance. SMA: The superior mesenteric artery remains fully patent. Renals: The renal arteries are patent and unremarkable in appearance. IMA: The inferior mesenteric artery remains fully patent. Inflow: The common iliac arteries, and external and internal iliac arteries, are grossly unremarkable, aside from mild calcification  along the left common iliac artery. The common femoral arteries and their visualized branches are grossly unremarkable. Veins: The visualized venous structures are grossly unremarkable. Prominent varices are incidentally noted along the anterior lower abdominal and pelvic wall, extending into the inguinal regions bilaterally. An IVC filter is noted in expected position, with prongs extending into surrounding soft tissues. One of the prongs appears to extend through the outer wall of the adjacent abdominal aorta, abutting the intimal layer, and there is new wall thickening at the right side of the abdominal aorta at this level measuring 4 mm in thickness, concerning for mild focal intramural hematoma surrounding the prong. There is minimal surrounding soft tissue inflammation, and aortitis secondary to the IVC prong could have a similar appearance. Review of the MIP images confirms the above findings. NON-VASCULAR Hepatobiliary: The liver is unremarkable in appearance. The gallbladder is unremarkable in appearance. The common bile duct remains normal in caliber. Pancreas: The pancreas is within normal limits. Spleen: The spleen is unremarkable in appearance. Adrenals/Urinary Tract: The  adrenal glands are unremarkable in appearance. The kidneys are within normal limits. There is no evidence of hydronephrosis. No renal or ureteral stones are identified. Perinephric stranding is noted bilaterally. Stomach/Bowel: The stomach is unremarkable in appearance. The small bowel is within normal limits. The appendix is normal in caliber, without evidence of appendicitis. The colon is unremarkable in appearance. Lymphatic: Visualized retroperitoneal nodes remain normal in size. The pelvic sidewalls are grossly unremarkable. Musculoskeletal: No acute osseous abnormalities are identified. The visualized musculature is unremarkable in appearance. Other: No additional soft tissue abnormalities are seen. Review of the MIP images confirms the above findings. IMPRESSION: 1. No evidence of aortic dissection. No evidence of aneurysmal dilatation. Mild aortic atherosclerosis noted. 2. No evidence of significant pulmonary embolus. 3. One of the prongs of the patient's IVC filter appears to extend through the outer wall of the adjacent abdominal aorta, abutting the intimal layer. There is new wall thickening at the right side of the abdominal aorta at this level measuring 4 mm in thickness, concerning for mild focal intramural hematoma surrounding the prong. There is minimal surrounding soft tissue inflammation. Aortitis secondary to the protruding IVC prong could have a similar appearance. Surgical consultation is recommended; would consider careful removal and replacement of the IVC filter, though there is risk of aortic rupture given this appearance. 4. Mild coronary artery calcification noted. 5. Prominent varices incidentally noted along the anterior lower abdominal and pelvic wall, extending into the inguinal regions bilaterally. These results were called by telephone at the time of interpretation on 05/30/2016 at 9:10 pm to Dr. Brantley Stage, who verbally acknowledged these results. Electronically Signed   By: Garald Balding M.D.   On: 05/30/2016 21:14    Procedures Procedures (including critical care time)  Medications Ordered in ED Medications  ipratropium-albuterol (DUONEB) 0.5-2.5 (3) MG/3ML nebulizer solution 3 mL (3 mLs Nebulization Refused 05/30/16 2200)  predniSONE (DELTASONE) tablet 60 mg (not administered)  sodium chloride 0.9 % bolus 500 mL (0 mLs Intravenous Stopped 05/30/16 1926)  acetaminophen (TYLENOL) tablet 1,000 mg (1,000 mg Oral Given 05/30/16 1843)  sodium chloride 0.9 % bolus 1,000 mL (1,000 mLs Intravenous New Bag/Given 05/30/16 1923)  ipratropium-albuterol (DUONEB) 0.5-2.5 (3) MG/3ML nebulizer solution 3 mL (3 mLs Nebulization Given 05/30/16 1922)  iopamidol (ISOVUE-370) 76 % injection 100 mL (100 mLs Intravenous Contrast Given 05/30/16 1956)     Initial Impression / Assessment and Plan / ED Course  I have reviewed the triage  vital signs and the nursing notes.  Pertinent labs & imaging results that were available during my care of the patient were reviewed by me and considered in my medical decision making (see chart for details).  Clinical Course    Chest pain free on arrival, but complaining of some low back pain. With oxygen requirement, initial soft BPs, but normotensive after fluids.  Intoxicated as well. Syncope, chest pain and back pain concerning for dissection. CTA chest abd/pelvis performed. No significant PE noted. No dissection or aneurysm. Concern for one of the prongs of the patient's IVC filter extending into the outer wall of the abdominal aorta causing new wall thickening and small intramural hematoma. This is discussed with Dr. Scot Dock from vascular surgery who evaluated patient in the ED. He did not feel that this is an acute finding given IVC filter placed in 2011, and felt to be incidental. He did not think that this was causing any of his symptoms and that he does not require acute surgical intervention at this time. Recommending repeat CT aorta in about 6 months as  an outpatient. Thus, unlikely aortic pathology, PE causing symptoms. Likely copd exacerbation, wheezing on exam and w/ oxygen requirement. No pneumonia on CT. Given prednisone and breathing treatments. Requiring ACS r/o and syncope w/u given risk factors. Admit to hospitalist service. Discussed with Dr. [name].   Final Clinical Impressions(s) / ED Diagnoses   Final diagnoses:  Chest pain, unspecified chest pain type  COPD exacerbation ([place])  Alcohol intoxication, uncomplicated ([place])    New Prescriptions New Prescriptions   No medications on file     Forde Dandy, MD 05/30/16 2325

## 2016-05-30 NOTE — Consult Note (Signed)
Patient name: [name] MRN: [id number] DOB: 10/22/1965 Sex: male  REASON FOR CONSULT: Extruded strut from IVC filter. Consult is from the ED.   HPI: [name] is a 50 y.o. male, who was helping a friend move today. He was lifting some heavy bags. He later developed upper left chest pain and upper back pain located between his shoulder blades. This pain ultimately resolved but he has now developed some low back pain and feels best when he lies on his side.  He has a history of DVTs in the left lower extremity and had an IVC filter placed in 2011 by interventional radiology. He had been on Xeralto for many years but quit 3 days ago causing was having multiple problems with nosebleeds.  He smokes 2 packs per day of cigarettes and has been smoking since he was 12. He drinks alcohol daily but could not quantitate the amount. His EtOH level was elevated on admission.  Past Medical History:  Diagnosis Date  . Collagen vascular disease ([place])   . COPD (chronic obstructive pulmonary disease) ([place])   . Coronary artery disease   . Degenerative joint disease   . Degenerative joint disease of spine   . Depression   . DVT (deep venous thrombosis) ([place])   . ETOH abuse   . Hepatitis B   . Lupus anticoagulant disorder ([place]) 02/02/2012  . Pulmonary embolism ([place]) 06/2010    Family History  Problem Relation Age of Onset  . Cancer Mother     Ovarian    SOCIAL HISTORY: Social History   Social History  . Marital status: Legally Separated    Spouse name: N/A  . Number of children: N/A  . Years of education: N/A   Occupational History  . Homeless   .  Not Employed   Social History Main Topics  . Smoking status: Current Every Day Smoker    Packs/day: 2.00    Years: 30.00    Types: Cigarettes  . Smokeless tobacco: Never Used  . Alcohol use 14.4 oz/week    24 Cans of beer per week     Comment: cas of beer and fifth of vodka daily  . Drug use: No  . Sexual activity: No   Other  Topics Concern  . Not on file   Social History Narrative   Estranged from family.  Homeless.      Allergies  Allergen Reactions  . Xarelto [Rivaroxaban] Other (See Comments)    Caused nosebleed for the last 3 days 9/20-9/23/17    Current Facility-Administered Medications  Medication Dose Route Frequency Provider Last Rate Last Dose  . ipratropium-albuterol (DUONEB) 0.5-2.5 (3) MG/3ML nebulizer solution 3 mL  3 mL Nebulization Once Forde Dandy, MD       Current Outpatient Prescriptions  Medication Sig Dispense Refill  . Aspirin-Salicylamide-Caffeine (BC FAST PAIN RELIEF) 650-195-33.3 MG PACK Take 1 Package by mouth daily as needed (pain).    . Ca Carbonate-Mag Hydroxide (ROLAIDS) 550-110 MG CHEW Chew 2 each by mouth daily as needed (indigestion).    . rivaroxaban (XARELTO) 20 MG TABS tablet Take 1 tablet (20 mg total) by mouth daily with breakfast. 90 tablet 3  . carvedilol (COREG) 6.25 MG tablet Take 1 tablet (6.25 mg total) by mouth 2 (two) times daily with a meal. For high blood pressure (Patient not taking: Reported on 05/30/2016) 60 tablet 0  . citalopram (CELEXA) 40 MG tablet Take 1 tablet (40 mg total) by mouth daily. (Patient not taking:  Reported on 05/30/2016) 30 tablet 1  . diclofenac sodium (VOLTAREN) 1 % GEL Apply 2 g topically 4 (four) times daily. For arthritic pain (Patient not taking: Reported on 05/30/2016)    . folic acid (FOLVITE) 1 MG tablet Take 1 tablet (1 mg total) by mouth daily. For low folate (Patient not taking: Reported on 05/30/2016) 30 tablet 0  . hydrOXYzine (ATARAX/VISTARIL) 25 MG tablet Take 1 tablet (25 mg total) by mouth 3 (three) times daily as needed for anxiety. (Patient not taking: Reported on 05/30/2016) 90 tablet 0  . nicotine (NICODERM CQ - DOSED IN MG/24 HOURS) 21 mg/24hr patch Place 1 patch (21 mg total) onto the skin daily at 6 (six) AM. For smoking cessation (Patient not taking: Reported on 05/30/2016) 28 patch 0  . thiamine 100 MG tablet Take 1  tablet (100 mg total) by mouth daily. For low Thiamine (Patient not taking: Reported on 05/30/2016) 30 tablet 0  . traZODone (DESYREL) 150 MG tablet Take 1 tablet (150 mg total) by mouth at bedtime. For sleep (Patient not taking: Reported on 05/30/2016) 30 tablet 0    REVIEW OF SYSTEMS:  [X]  denotes positive finding, [ ]  denotes negative finding Cardiac  Comments:  Chest pain or chest pressure: X Resolved  Shortness of breath upon exertion: X   Short of breath when lying flat:    Irregular heart rhythm:        Vascular    Pain in calf, thigh, or hip brought on by ambulation:    Pain in feet at night that wakes you up from your sleep:     Blood clot in your veins: X Left leg many years ago.   Leg swelling:         Pulmonary    Oxygen at home:    Productive cough:     Wheezing:         Neurologic    Sudden weakness in arms or legs:     Sudden numbness in arms or legs:     Sudden onset of difficulty speaking or slurred speech:    Temporary loss of vision in one eye:     Problems with dizziness:         Gastrointestinal X He has had some nausea and vomiting for the last 2-3 days also.   Blood in stool:     Vomited blood:         Genitourinary    Burning when urinating:     Blood in urine:        Psychiatric    Major depression:         Hematologic    Bleeding problems: X He has been having nosebleeds for 3 days.   Problems with blood clotting too easily:        Skin    Rashes or ulcers:        Constitutional    Fever or chills:      PHYSICAL EXAM: Vitals:   05/30/16 1915 05/30/16 1930 05/30/16 2020 05/30/16 2045  BP: 96/57 118/92  106/63  Pulse: 84 66  95  Resp: 22 15  18   Temp:   98.2 F (36.8 C)   TempSrc:   Oral   SpO2: 90% 100%  92%  Weight:      Height:        GENERAL: The patient is a well-nourished male, in no acute distress. The vital signs are documented above. CARDIAC: There is a regular rate and rhythm.  VASCULAR: I do not to take carotid  bruits. He has palpable femoral, dorsalis pedis, and posterior tibial pulses bilaterally. He has no significant lower extremity swelling. PULMONARY: There is good air exchange bilaterally without wheezing or rales. ABDOMEN: Soft and non-tender with normal pitched bowel sounds. He has some varicose veins in his lower abdomen. I cannot palpate an aneurysm however he is obese and it is difficult to assess. MUSCULOSKELETAL: There are no major deformities or cyanosis. NEUROLOGIC: No focal weakness or paresthesias are detected. SKIN: There are no ulcers or rashes noted. PSYCHIATRIC: The patient has a normal affect.  DATA:   CT ABDOMEN PELVIS: I have reviewed the images of his CT scan. The IVC filter slightly tilted and there is a strut that protrudes through the caval wall and abuts the infrarenal aorta proximally for a half centimeters above the bifurcation. There is some thickening around this area suggesting inflammation. I do not see any evidence of hematoma or bleeding.  His platelet count is 102,000. Hemoglobin 13.6 with hematocrit of 40.8.  EtOH is 261. Coags are normal.  MEDICAL ISSUES:  EXTRUDED IVC FILTER STRUT: This patient does have an IVC filter strut that penetrates the cava and lies adjacent to the infrarenal aorta approximately 4.5 centimeters above the bifurcation. There is some thickening in this area but I did not see any evidence of bleeding or  hematoma. Given that the IVC filter has been in for 6 years, I suspect that it is very well incorporated in the inferior vena cava and it is unlikely that this protrusion of the strut occurred acutely. In addition the filter would be extremely difficult to remove as it has been in for 6 years. The patient has thrombocytopenia and history of heavy alcohol abuse with varices in his lower abdomen and surgery would be associated with significantly increased risk. I do not think that the protruded strut explains his back pain. I would recommend a  follow up CT of the abdomen in 6 weeks and I can arrange that after he is discharged.    Deitra Mayo [facility] [phone]

## 2016-05-31 ENCOUNTER — Observation Stay (HOSPITAL_COMMUNITY): Payer: Self-pay

## 2016-05-31 DIAGNOSIS — R079 Chest pain, unspecified: Secondary | ICD-10-CM

## 2016-05-31 DIAGNOSIS — Z720 Tobacco use: Secondary | ICD-10-CM

## 2016-05-31 DIAGNOSIS — M545 Low back pain, unspecified: Secondary | ICD-10-CM

## 2016-05-31 DIAGNOSIS — R55 Syncope and collapse: Secondary | ICD-10-CM

## 2016-05-31 LAB — TROPONIN I
Troponin I: <0.03 ng/mL (ref <0.03)
Troponin I: <0.03 ng/mL (ref <0.03)

## 2016-05-31 LAB — COMPREHENSIVE METABOLIC PANEL
ALK PHOS: 88 U/L (ref 38–126)
ALT: 71 U/L — AB (ref 17–63)
ANION GAP: 9 (ref 5–15)
AST: 84 U/L — ABNORMAL HIGH (ref 15–41)
Albumin: 3.1 g/dL — ABNORMAL LOW (ref 3.5–5.0)
BILIRUBIN TOTAL: 0.5 mg/dL (ref 0.3–1.2)
BUN: <5 mg/dL — ABNORMAL LOW (ref 6–20)
CALCIUM: 8.1 mg/dL — AB (ref 8.9–10.3)
CO2: 22 mmol/L (ref 22–32)
CREATININE: 0.71 mg/dL (ref 0.61–1.24)
Chloride: 106 mmol/L (ref 101–111)
Glucose, Bld: 138 mg/dL — ABNORMAL HIGH (ref 65–99)
Potassium: 4.2 mmol/L (ref 3.5–5.1)
Sodium: 137 mmol/L (ref 135–145)
TOTAL PROTEIN: 6.7 g/dL (ref 6.5–8.1)

## 2016-05-31 LAB — HEPATIC FUNCTION PANEL
ALBUMIN: 3.1 g/dL — AB (ref 3.5–5.0)
ALK PHOS: 84 U/L (ref 38–126)
ALT: 72 U/L — ABNORMAL HIGH (ref 17–63)
AST: 92 U/L — AB (ref 15–41)
BILIRUBIN TOTAL: 0.6 mg/dL (ref 0.3–1.2)
Bilirubin, Direct: 0.1 mg/dL (ref 0.1–0.5)
Indirect Bilirubin: 0.5 mg/dL (ref 0.3–0.9)
TOTAL PROTEIN: 6.5 g/dL (ref 6.5–8.1)

## 2016-05-31 LAB — LACTIC ACID, PLASMA: Lactic Acid, Venous: 1.8 mmol/L (ref 0.5–1.9)

## 2016-05-31 LAB — GLUCOSE, CAPILLARY: GLUCOSE-CAPILLARY: 134 mg/dL — AB (ref 65–99)

## 2016-05-31 MED ORDER — SODIUM CHLORIDE 0.9% FLUSH
3.0000 mL | Freq: Two times a day (BID) | INTRAVENOUS | Status: DC
  Administered 2016-05-31 – 2016-06-02 (×5): 3 mL via INTRAVENOUS

## 2016-05-31 MED ORDER — LORAZEPAM 2 MG/ML IJ SOLN
2.0000 mg | INTRAMUSCULAR | Status: DC | PRN
  Administered 2016-05-31 – 2016-06-01 (×3): 2 mg via INTRAVENOUS
  Filled 2016-05-31 (×3): qty 1

## 2016-05-31 MED ORDER — LORAZEPAM 1 MG PO TABS
1.0000 mg | ORAL_TABLET | Freq: Four times a day (QID) | ORAL | Status: DC | PRN
  Administered 2016-05-31: 1 mg via ORAL
  Filled 2016-05-31: qty 1

## 2016-05-31 MED ORDER — ACETAMINOPHEN 325 MG PO TABS: 650.0000 mg | ORAL_TABLET | Freq: Four times a day (QID) | ORAL | Status: DC | PRN

## 2016-05-31 MED ORDER — RIVAROXABAN 20 MG PO TABS
20.0000 mg | ORAL_TABLET | Freq: Every day | ORAL | Status: DC
Start: 2016-05-31 — End: 2016-06-02
  Administered 2016-05-31 – 2016-06-02 (×3): 20 mg via ORAL
  Filled 2016-05-31 (×3): qty 1

## 2016-05-31 MED ORDER — PREDNISONE 20 MG PO TABS
40.0000 mg | ORAL_TABLET | Freq: Every day | ORAL | Status: DC
  Administered 2016-05-31 – 2016-06-01 (×2): 40 mg via ORAL
  Filled 2016-05-31 (×2): qty 2

## 2016-05-31 MED ORDER — ADULT MULTIVITAMIN W/MINERALS CH
1.0000 | ORAL_TABLET | Freq: Every day | ORAL | Status: DC
  Administered 2016-05-31 – 2016-06-02 (×3): 1 via ORAL
  Filled 2016-05-31 (×3): qty 1

## 2016-05-31 MED ORDER — IPRATROPIUM-ALBUTEROL 0.5-2.5 (3) MG/3ML IN SOLN: 3.0000 mL | RESPIRATORY_TRACT | Status: DC | PRN

## 2016-05-31 MED ORDER — ONDANSETRON HCL 4 MG PO TABS
4.0000 mg | ORAL_TABLET | Freq: Four times a day (QID) | ORAL | Status: DC | PRN
  Administered 2016-05-31: 4 mg via ORAL
  Filled 2016-05-31: qty 1

## 2016-05-31 MED ORDER — VITAMIN B-1 100 MG PO TABS
100.0000 mg | ORAL_TABLET | Freq: Every day | ORAL | Status: DC
  Administered 2016-05-31 – 2016-06-02 (×3): 100 mg via ORAL
  Filled 2016-05-31 (×3): qty 1

## 2016-05-31 MED ORDER — METOPROLOL TARTRATE 12.5 MG HALF TABLET
12.5000 mg | ORAL_TABLET | Freq: Two times a day (BID) | ORAL | Status: DC
  Administered 2016-05-31 – 2016-06-02 (×5): 12.5 mg via ORAL
  Filled 2016-05-31 (×5): qty 1

## 2016-05-31 MED ORDER — ACETAMINOPHEN 650 MG RE SUPP: 650.0000 mg | Freq: Four times a day (QID) | RECTAL | Status: DC | PRN

## 2016-05-31 MED ORDER — INFLUENZA VAC SPLIT QUAD 0.5 ML IM SUSY
0.5000 mL | PREFILLED_SYRINGE | INTRAMUSCULAR | Status: AC
  Administered 2016-06-01: 0.5 mL via INTRAMUSCULAR
  Filled 2016-05-31: qty 0.5

## 2016-05-31 MED ORDER — THIAMINE HCL 100 MG/ML IJ SOLN: 100.0000 mg | Freq: Every day | INTRAMUSCULAR | Status: DC

## 2016-05-31 MED ORDER — IPRATROPIUM-ALBUTEROL 0.5-2.5 (3) MG/3ML IN SOLN
3.0000 mL | Freq: Four times a day (QID) | RESPIRATORY_TRACT | Status: DC
  Administered 2016-05-31 (×3): 3 mL via RESPIRATORY_TRACT
  Filled 2016-05-31 (×4): qty 3

## 2016-05-31 MED ORDER — SODIUM CHLORIDE 0.9 % IV SOLN
INTRAVENOUS | Status: AC
  Administered 2016-05-31: 100 mL/h via INTRAVENOUS

## 2016-05-31 MED ORDER — LORAZEPAM 2 MG/ML IJ SOLN: 1.0000 mg | Freq: Four times a day (QID) | INTRAMUSCULAR | Status: DC | PRN

## 2016-05-31 MED ORDER — ONDANSETRON HCL 4 MG/2ML IJ SOLN: 4.0000 mg | Freq: Four times a day (QID) | INTRAMUSCULAR | Status: DC | PRN

## 2016-05-31 MED ORDER — OXYCODONE HCL 5 MG PO TABS
5.0000 mg | ORAL_TABLET | ORAL | Status: DC | PRN
  Administered 2016-05-31 – 2016-06-02 (×5): 5 mg via ORAL
  Filled 2016-05-31 (×5): qty 1

## 2016-05-31 MED ORDER — FOLIC ACID 1 MG PO TABS
1.0000 mg | ORAL_TABLET | Freq: Every day | ORAL | Status: DC
Start: 2016-05-31 — End: 2016-06-02
  Administered 2016-05-31 – 2016-06-02 (×3): 1 mg via ORAL
  Filled 2016-05-31 (×3): qty 1

## 2016-05-31 MED ORDER — NICOTINE 21 MG/24HR TD PT24
21.0000 mg | MEDICATED_PATCH | Freq: Every day | TRANSDERMAL | Status: DC
  Administered 2016-05-31 – 2016-06-02 (×2): 21 mg via TRANSDERMAL
  Filled 2016-05-31 (×3): qty 1

## 2016-05-31 NOTE — Plan of Care (Signed)
Problem: Safety: Goal: Ability to remain free from injury will improve Outcome: Progressing Patient instructed to call with call light or phone and shown where the numbers are for the RN/NT and patient verbalized understanding, also explained the need for the bed alarm at night and the reasoning behind it and patient verbalized understanding, will continue to monitor.

## 2016-05-31 NOTE — Progress Notes (Signed)
Patient evaluated earlier this evening on arrival in the emergency room. A code STEMI was paged out from EMS. His chart is carefully reviewed. His EKG is compared to previous. He has borderline ST elevation in his inferior leads, but this is unchanged from his previous tracings. His clinical presentation is not consistent with an ST elevation MI. He will be evaluated in the emergency department with appropriate referral/admission depending on the findings of his evaluation.  [name] 05/31/2016 1:43 AM

## 2016-05-31 NOTE — Progress Notes (Signed)
PROGRESS NOTE        PATIENT DETAILS Name: [name] Age: 50 y.o. Sex: male Date of Birth: [birth year] Admit Date: 05/30/2016 Admitting Physician Lily Kocher, MD PCP:No PCP Per Patient  Brief Narrative: Patient is a 50 y.o. 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 Admitted for further evaluation and treatment  Subjective: Chest pain is much better. Complains of persistent back pain. Moving all 4 extremities. He is able to sit and stand up  Assessment/Plan: Principal Problem: Chest pain: Mostly with atypical features.CT angiogram of the chest negative for large PE and aortic dissection. Cardiac enzymes negative, cardiology planning on mucus stress test tomorrow, echocardiogram today. Follow.  Active Problems: Syncope:suspect orthostatics-monitor in tele, await Echo  Low back pain: Suspect musculoskeletal-nonfocal exam.x-rays of the lumbar spine shows degenerative disc disease. Furthermore, on CT angiogram of the abdomen-no major musculoskeletal of modalities seen. Supportive care for now and follow.   Alcohol withdrawal: Tremulous-but awake and alert. Continue Ativan-but will change to Ativan per stepdown protocol. per protocol-but will change to stepdown protocol  COPD exacerbation: Better, lungs are clear this morning. Continue bronchodilators and oral prednisone-will plan a rapid taper.  Possible IVC filter dislodgment: Seen by vascular surgery, recommendations for outpatient follow-up and repeat scans.  History of venous thromboembolism: Continue Xarelto.  Active tobacco [id number]   DVT Prophylaxis: Xarelto  Code Status: Full code   Family Communication: None at bedside  Disposition Plan: Remain  inpatient  Antimicrobial agents: None  Procedures: None  CONSULTS:  cardiology  Time spent: 25 minutes-Greater than 50% of this time was spent in counseling, explanation of diagnosis, planning of further management, and coordination of care.  MEDICATIONS: Anti-infectives    None      Scheduled Meds: . folic acid  1 mg Oral Daily  . [START ON 06/01/2016] Influenza vac split quadrivalent PF  0.5 mL Intramuscular Tomorrow-1000  . ipratropium-albuterol  3 mL Nebulization Q6H  . metoprolol tartrate  12.5 mg Oral BID  . multivitamin with minerals  1 tablet Oral Daily  . nicotine  21 mg Transdermal Q0600  . predniSONE  40 mg Oral Q breakfast  . rivaroxaban  20 mg Oral Q breakfast  . sodium chloride flush  3 mL Intravenous Q12H  . thiamine  100 mg Oral Daily   Or  . thiamine  100 mg Intravenous Daily   Continuous Infusions:  PRN Meds:.acetaminophen **OR** acetaminophen, ipratropium-albuterol, LORazepam **OR** LORazepam, ondansetron **OR** ondansetron (ZOFRAN) IV, oxyCODONE   PHYSICAL EXAM: Vital signs: Vitals:   05/31/16 0528 05/31/16 0531 05/31/16 0919 05/31/16 1146  BP: (!) 156/88 (!) 153/99    Pulse: 94 99  69  Resp:      Temp:      TempSrc:      SpO2:   91%   Weight:      Height:       Filed Weights   05/30/16 1741 05/31/16 0045  Weight: 99.8 kg (220 lb) 96.3 kg (212 lb 6.4 oz)   Body mass index is 27.27 kg/m.   General appearance :Awake, alert, not in any distress. Speech Clear. Not toxic Looking Eyes:, pupils equally reactive to light and accomodation,no scleral icterus.Pink conjunctiva HEENT:  Atraumatic and Normocephalic Neck: supple, no JVD. No cervical lymphadenopathy. No thyromegaly Resp:Good air entry bilaterally, no added sounds  CVS: S1 S2 regular, no murmurs.  GI: Bowel sounds present, Non tender and not distended with no gaurding, rigidity or rebound.No organomegaly Extremities: B/L Lower Ext shows no edema, both legs are warm to  touch Neurology:  speech clear,Non focal, sensation is grossly intact. Psychiatric: Normal judgment and insight. Alert and oriented x 3. Normal mood. Musculoskeletal:gait appears to be normal.No digital cyanosis Skin:No Rash, warm and dry Wounds:N/A  I have personally reviewed following labs and imaging studies  LABORATORY DATA: CBC:  Recent Labs Lab 05/30/16 1745  WBC 5.5  NEUTROABS 3.2  HGB 13.6  HCT 40.8  MCV 101.5*  PLT 102*    Basic Metabolic Panel:  Recent Labs Lab 05/30/16 1745 05/31/16 0608  NA 139 137  K 3.9 4.2  CL 108 106  CO2 22 22  GLUCOSE 89 138*  BUN <5* <5*  CREATININE 0.64 0.71  CALCIUM 8.2* 8.1*    GFR: Estimated Creatinine Clearance: 127 mL/min (by C-G formula based on SCr of 0.71 mg/dL).  Liver Function Tests:  Recent Labs Lab 05/31/16 0134 05/31/16 0608  AST 92* 84*  ALT 72* 71*  ALKPHOS 84 88  BILITOT 0.6 0.5  PROT 6.5 6.7  ALBUMIN 3.1* 3.1*   No results for input(s): LIPASE, AMYLASE in the last 168 hours. No results for input(s): AMMONIA in the last 168 hours.  Coagulation Profile:  Recent Labs Lab 05/30/16 1745  INR 1.00    Cardiac Enzymes:  Recent Labs Lab 05/31/16 0134 05/31/16 0608  TROPONINI <0.03 <0.03    BNP (last 3 results) No results for input(s): PROBNP in the last 8760 hours.  HbA1C: No results for input(s): HGBA1C in the last 72 hours.  CBG:  Recent Labs Lab 05/31/16 0719  GLUCAP 134*    Lipid Profile: No results for input(s): CHOL, HDL, LDLCALC, TRIG, CHOLHDL, LDLDIRECT in the last 72 hours.  Thyroid Function Tests: No results for input(s): TSH, T4TOTAL, FREET4, T3FREE, THYROIDAB in the last 72 hours.  Anemia Panel: No results for input(s): VITAMINB12, FOLATE, FERRITIN, TIBC, IRON, RETICCTPCT in the last 72 hours.  Urine analysis:    Component Value Date/Time   COLORURINE YELLOW 02/09/2016 [place] 02/09/2016 0553   LABSPEC >1.046 (H) 02/09/2016 0553   PHURINE  5.0 02/09/2016 0553   GLUCOSEU NEGATIVE 02/09/2016 0553   HGBUR NEGATIVE 02/09/2016 0553   BILIRUBINUR NEGATIVE 02/09/2016 0553   KETONESUR NEGATIVE 02/09/2016 0553   PROTEINUR NEGATIVE 02/09/2016 0553   UROBILINOGEN 0.2 04/25/2015 0245   NITRITE NEGATIVE 02/09/2016 0553   LEUKOCYTESUR NEGATIVE 02/09/2016 0553    Sepsis Labs: Lactic Acid, Venous    Component Value Date/Time   LATICACIDVEN 1.8 05/31/2016 0134    MICROBIOLOGY: No results found for this or any previous visit (from the past 240 hour(s)).  RADIOLOGY STUDIES/RESULTS: Dg Lumbar Spine 2-3 Views  Result Date: 05/31/2016 CLINICAL DATA:  Initial encounter for Patient was carrying heavy objects yesterday and he fell later that day (he thinks he might have slipped a disc in his spine). EXAM: LUMBAR SPINE - 2-3 VIEW COMPARISON:  CT of 05/03/2015 FINDINGS: Five lumbar type vertebral bodies. IVC filter. Sacroiliac joints are symmetric. Maintenance of vertebral body height and alignment. Loss of intervertebral disc height at L4-5 and L5-S1. Facet arthropathy at these levels. IMPRESSION: Spondylosis/ degenerative disc disease. No acute osseous abnormality. Electronically Signed   By: Abigail Miyamoto  M.D.   On: 05/31/2016 11:22   Ct Angio Chest/abd/pel For Dissection W And/or Wo Contrast  Result Date: 05/30/2016 CLINICAL DATA:  Acute onset of generalized chest pain and back pain. Dyspnea and syncope. Patient on Xarelto. Initial encounter. EXAM: CT ANGIOGRAPHY CHEST, ABDOMEN AND PELVIS TECHNIQUE: Multidetector CT imaging through the chest, abdomen and pelvis was performed using the standard protocol during bolus administration of intravenous contrast. Multiplanar reconstructed images and MIPs were obtained and reviewed to evaluate the vascular anatomy. CONTRAST:  100 mL of Isovue 370 IV contrast COMPARISON:  CTA of the chest performed 04/22/2016, and CT of the abdomen and pelvis from 05/03/2015 FINDINGS: CTA CHEST FINDINGS Cardiovascular: There  is no evidence of aortic dissection. There is no evidence of aneurysmal dilatation. Minimal calcification is seen at the proximal left subclavian artery. Mild coronary artery calcification is noted. No additional calcific atherosclerotic disease is seen. The great vessels are otherwise unremarkable in appearance. There is no evidence of significant pulmonary embolus. Mediastinum/Nodes: The mediastinum is unremarkable in appearance. No mediastinal lymphadenopathy is seen. No pericardial effusion is identified. The thyroid gland is unremarkable. No axillary lymphadenopathy is appreciated. Lungs/Pleura: Minimal scarring is noted at the lung bases. The lungs are otherwise clear bilaterally. A calcified granuloma is noted at superior aspect of the left lower lobe. No pleural effusion or pneumothorax is seen. No masses are identified. Musculoskeletal: No acute osseous abnormalities are identified. The visualized musculature is unremarkable in appearance. Review of the MIP images confirms the above findings. CTA ABDOMEN AND PELVIS FINDINGS VASCULAR Aorta: Mild scattered calcification is seen along the abdominal aorta. The abdominal aorta remains fully patent. Celiac: The celiac trunk is patent and unremarkable in appearance. SMA: The superior mesenteric artery remains fully patent. Renals: The renal arteries are patent and unremarkable in appearance. IMA: The inferior mesenteric artery remains fully patent. Inflow: The common iliac arteries, and external and internal iliac arteries, are grossly unremarkable, aside from mild calcification along the left common iliac artery. The common femoral arteries and their visualized branches are grossly unremarkable. Veins: The visualized venous structures are grossly unremarkable. Prominent varices are incidentally noted along the anterior lower abdominal and pelvic wall, extending into the inguinal regions bilaterally. An IVC filter is noted in expected position, with prongs  extending into surrounding soft tissues. One of the prongs appears to extend through the outer wall of the adjacent abdominal aorta, abutting the intimal layer, and there is new wall thickening at the right side of the abdominal aorta at this level measuring 4 mm in thickness, concerning for mild focal intramural hematoma surrounding the prong. There is minimal surrounding soft tissue inflammation, and aortitis secondary to the IVC prong could have a similar appearance. Review of the MIP images confirms the above findings. NON-VASCULAR Hepatobiliary: The liver is unremarkable in appearance. The gallbladder is unremarkable in appearance. The common bile duct remains normal in caliber. Pancreas: The pancreas is within normal limits. Spleen: The spleen is unremarkable in appearance. Adrenals/Urinary Tract: The adrenal glands are unremarkable in appearance. The kidneys are within normal limits. There is no evidence of hydronephrosis. No renal or ureteral stones are identified. Perinephric stranding is noted bilaterally. Stomach/Bowel: The stomach is unremarkable in appearance. The small bowel is within normal limits. The appendix is normal in caliber, without evidence of appendicitis. The colon is unremarkable in appearance. Lymphatic: Visualized retroperitoneal nodes remain normal in size. The pelvic sidewalls are grossly unremarkable. Musculoskeletal: No acute osseous abnormalities are identified. The visualized musculature is unremarkable in appearance.  Other: No additional soft tissue abnormalities are seen. Review of the MIP images confirms the above findings. IMPRESSION: 1. No evidence of aortic dissection. No evidence of aneurysmal dilatation. Mild aortic atherosclerosis noted. 2. No evidence of significant pulmonary embolus. 3. One of the prongs of the patient's IVC filter appears to extend through the outer wall of the adjacent abdominal aorta, abutting the intimal layer. There is new wall thickening at the  right side of the abdominal aorta at this level measuring 4 mm in thickness, concerning for mild focal intramural hematoma surrounding the prong. There is minimal surrounding soft tissue inflammation. Aortitis secondary to the protruding IVC prong could have a similar appearance. Surgical consultation is recommended; would consider careful removal and replacement of the IVC filter, though there is risk of aortic rupture given this appearance. 4. Mild coronary artery calcification noted. 5. Prominent varices incidentally noted along the anterior lower abdominal and pelvic wall, extending into the inguinal regions bilaterally. These results were called by telephone at the time of interpretation on 05/30/2016 at 9:10 pm to Dr. Brantley Stage, who verbally acknowledged these results. Electronically Signed   By: Garald Balding M.D.   On: 05/30/2016 21:14     LOS: 0 days   Oren Binet, MD  [facility] Pager:[phone]  If 7PM-7AM, please contact night-coverage [email or web address] Password [facility] 05/31/2016, 11:48 AM

## 2016-05-31 NOTE — Progress Notes (Signed)
Cardiology Consult    Patient ID: [name] MRN: [id number], DOB/AGE: May 12, 1966   Admit date: 05/30/2016 Date of Consult: 05/31/2016  Primary Physician: No PCP Per Patient Reason for Consult: Chest pain  Primary Cardiologist: New  Requesting Provider: Dr. Sloan Leiter  Patient Profile  [name] is a 50 year old male with a past medical history of COPD, CAD, DVT s/p IVC filter placement 6 years ago, PE, and heavy ETOH abuse. He presented with chest pain over the past 3 days and nosebleeds.   History of Present Illness  [name] says he was helping a friend move and was lifting some heavy bags. He developed substernal left chest pain and interscapular pain. His pain resolved with rest, he denies associated SOB, nausea, and diaphoresis.   He was found to be hypoxic in the ED with O2 saturations in the 80's. Code STEMI was initially called as he had some borderline ST elevation in his inferior leads, this was unchanged from his previous EKG's. Code STEMI was cancelled.   He tells me that he drinks alcohol heavily every day. He has noticed more frequent nosebleeds lately and is on Xarelto. He stopped taking his Xarelto due to the nosebleeds.   There was an incidental finding on his CT abdomen/pelvis that showed a strut from his IVC filter that penetrates the cava and lies adjacent to the infrarenal aorta approximately 4.5 centimeters above the bifurcation. Vascular surgery has consulted and thinks that this likely did not occur acutely. Removing the filter would come with significant risk. 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    Past Medical History   Past Medical History:  Diagnosis Date  . Collagen vascular disease ([place])   . COPD (chronic obstructive pulmonary disease) ([place])   . Coronary artery disease   . Degenerative joint disease   . Degenerative joint disease of spine   . Depression   . DVT (deep venous thrombosis) ([place])   . ETOH  abuse   . Hepatitis B   . Lupus anticoagulant disorder ([place]) 02/02/2012  . Pulmonary embolism ([place]) 06/2010    Past Surgical History:  Procedure Laterality Date  . LEFT ELBOW SURGERY    . TONSILLECTOMY       Allergies  Allergies  Allergen Reactions  . Xarelto [Rivaroxaban] Other (See Comments)    Caused nosebleed for the last 3 days 9/20-9/23/17    Inpatient Medications    . folic acid  1 mg Oral Daily  . [START ON 06/01/2016] Influenza vac split quadrivalent PF  0.5 mL Intramuscular Tomorrow-1000  . ipratropium-albuterol  3 mL Nebulization Q6H  . multivitamin with minerals  1 tablet Oral Daily  . nicotine  21 mg Transdermal Q0600  . predniSONE  40 mg Oral Q breakfast  . rivaroxaban  20 mg Oral Q breakfast  . sodium chloride flush  3 mL Intravenous Q12H  . thiamine  100 mg Oral Daily   Or  . thiamine  100 mg Intravenous Daily    Family History    Family History  Problem Relation Age of Onset  . Cancer Mother     Ovarian    Social History    Social History   Social History  . Marital status: Legally Separated    Spouse name: N/A  . Number of children: N/A  . Years of education: N/A   Occupational History  . Homeless   .  Not Employed   Social History Main  Topics  . Smoking status: Current Every Day Smoker    Packs/day: 2.00    Years: 30.00    Types: Cigarettes  . Smokeless tobacco: Never Used  . Alcohol use 14.4 oz/week    24 Cans of beer per week     Comment: cas of beer and fifth of vodka daily  . Drug use: No  . Sexual activity: No   Other Topics Concern  . Not on file   Social History Narrative   Estranged from family.  Homeless.       Review of Systems    General:  No chills, fever, night sweats or weight changes.  Cardiovascular:  No chest pain, +dyspnea on exertion, edema, orthopnea, palpitations, +paroxysmal nocturnal dyspnea. Dermatological: No rash, lesions/masses Respiratory: No cough, dyspnea Urologic: No hematuria,  dysuria Abdominal:   No nausea, vomiting, diarrhea, bright red blood per rectum, melena, or hematemesis Neurologic:  No visual changes, wkns, changes in mental status. All other systems reviewed and are otherwise negative except as noted above.  Physical Exam    Blood pressure (!) 153/99, pulse 99, temperature 98.4 F (36.9 C), resp. rate 20, height 6\' 2"  (1.88 m), weight 212 lb 6.4 oz (96.3 kg), SpO2 94 %.  General: Pleasant, has tremors, seems distressed  Psych: Normal affect. Neuro: Alert and oriented X 3. Moves all extremities spontaneously. HEENT: Normal  Neck: Supple without bruits or JVD. Lungs:  Resp regular and unlabored, CTA. Heart: RRR no s3, s4, or murmurs. Abdomen: Soft, non-tender, non-distended, BS + x 4.  Extremities: + clubbing, No cyanosis +2 edema BLE. DP/PT/Radials 2+ and equal bilaterally.  Labs    Troponin [facility] Test)  Recent Labs  05/30/16 1743  TROPIPOC 0.00    Recent Labs  05/31/16 0134 05/31/16 0608  TROPONINI <0.03 <0.03   Lab Results  Component Value Date   WBC 5.5 05/30/2016   HGB 13.6 05/30/2016   HCT 40.8 05/30/2016   MCV 101.5 (H) 05/30/2016   PLT 102 (L) 05/30/2016    Recent Labs Lab 05/31/16 0608  NA 137  K 4.2  CL 106  CO2 22  BUN <5*  CREATININE 0.71  CALCIUM 8.1*  PROT 6.7  BILITOT 0.5  ALKPHOS 88  ALT 71*  AST 84*  GLUCOSE 138*   Lab Results  Component Value Date   CHOL 199 09/18/2014   HDL 94 09/18/2014   LDLCALC 81 09/18/2014   TRIG 120 09/18/2014   Lab Results  Component Value Date   DDIMER <0.27 07/01/2012     Radiology Studies    Ct Angio Chest/abd/pel For Dissection W And/or Wo Contrast  Result Date: 05/30/2016 CLINICAL DATA:  Acute onset of generalized chest pain and back pain. Dyspnea and syncope. Patient on Xarelto. Initial encounter. EXAM: CT ANGIOGRAPHY CHEST, ABDOMEN AND PELVIS TECHNIQUE: Multidetector CT imaging through the chest, abdomen and pelvis was performed using the standard  protocol during bolus administration of intravenous contrast. Multiplanar reconstructed images and MIPs were obtained and reviewed to evaluate the vascular anatomy. CONTRAST:  100 mL of Isovue 370 IV contrast COMPARISON:  CTA of the chest performed 04/22/2016, and CT of the abdomen and pelvis from 05/03/2015 FINDINGS: CTA CHEST FINDINGS Cardiovascular: There is no evidence of aortic dissection. There is no evidence of aneurysmal dilatation. Minimal calcification is seen at the proximal left subclavian artery. Mild coronary artery calcification is noted. No additional calcific atherosclerotic disease is seen. The great vessels are otherwise unremarkable in appearance. There is no evidence  of significant pulmonary embolus. Mediastinum/Nodes: The mediastinum is unremarkable in appearance. No mediastinal lymphadenopathy is seen. No pericardial effusion is identified. The thyroid gland is unremarkable. No axillary lymphadenopathy is appreciated. Lungs/Pleura: Minimal scarring is noted at the lung bases. The lungs are otherwise clear bilaterally. A calcified granuloma is noted at superior aspect of the left lower lobe. No pleural effusion or pneumothorax is seen. No masses are identified. Musculoskeletal: No acute osseous abnormalities are identified. The visualized musculature is unremarkable in appearance. Review of the MIP images confirms the above findings. CTA ABDOMEN AND PELVIS FINDINGS VASCULAR Aorta: Mild scattered calcification is seen along the abdominal aorta. The abdominal aorta remains fully patent. Celiac: The celiac trunk is patent and unremarkable in appearance. SMA: The superior mesenteric artery remains fully patent. Renals: The renal arteries are patent and unremarkable in appearance. IMA: The inferior mesenteric artery remains fully patent. Inflow: The common iliac arteries, and external and internal iliac arteries, are grossly unremarkable, aside from mild calcification along the left common iliac  artery. The common femoral arteries and their visualized branches are grossly unremarkable. Veins: The visualized venous structures are grossly unremarkable. Prominent varices are incidentally noted along the anterior lower abdominal and pelvic wall, extending into the inguinal regions bilaterally. An IVC filter is noted in expected position, with prongs extending into surrounding soft tissues. One of the prongs appears to extend through the outer wall of the adjacent abdominal aorta, abutting the intimal layer, and there is new wall thickening at the right side of the abdominal aorta at this level measuring 4 mm in thickness, concerning for mild focal intramural hematoma surrounding the prong. There is minimal surrounding soft tissue inflammation, and aortitis secondary to the IVC prong could have a similar appearance. Review of the MIP images confirms the above findings. NON-VASCULAR Hepatobiliary: The liver is unremarkable in appearance. The gallbladder is unremarkable in appearance. The common bile duct remains normal in caliber. Pancreas: The pancreas is within normal limits. Spleen: The spleen is unremarkable in appearance. Adrenals/Urinary Tract: The adrenal glands are unremarkable in appearance. The kidneys are within normal limits. There is no evidence of hydronephrosis. No renal or ureteral stones are identified. Perinephric stranding is noted bilaterally. Stomach/Bowel: The stomach is unremarkable in appearance. The small bowel is within normal limits. The appendix is normal in caliber, without evidence of appendicitis. The colon is unremarkable in appearance. Lymphatic: Visualized retroperitoneal nodes remain normal in size. The pelvic sidewalls are grossly unremarkable. Musculoskeletal: No acute osseous abnormalities are identified. The visualized musculature is unremarkable in appearance. Other: No additional soft tissue abnormalities are seen. Review of the MIP images confirms the above findings.  IMPRESSION: 1. No evidence of aortic dissection. No evidence of aneurysmal dilatation. Mild aortic atherosclerosis noted. 2. No evidence of significant pulmonary embolus. 3. One of the prongs of the patient's IVC filter appears to extend through the outer wall of the adjacent abdominal aorta, abutting the intimal layer. There is new wall thickening at the right side of the abdominal aorta at this level measuring 4 mm in thickness, concerning for mild focal intramural hematoma surrounding the prong. There is minimal surrounding soft tissue inflammation. Aortitis secondary to the protruding IVC prong could have a similar appearance. Surgical consultation is recommended; would consider careful removal and replacement of the IVC filter, though there is risk of aortic rupture given this appearance. 4. Mild coronary artery calcification noted. 5. Prominent varices incidentally noted along the anterior lower abdominal and pelvic wall, extending into the inguinal regions bilaterally.  These results were called by telephone at the time of interpretation on 05/30/2016 at 9:10 pm to Dr. Brantley Stage, who verbally acknowledged these results. Electronically Signed   By: Garald Balding M.D.   On: 05/30/2016 21:14    EKG & Cardiac Imaging    EKG: NSR, minimal ST elevation in inferior leads, this is his baseline.   Echocardiogram: pending.   Assessment & Plan  1. Chest pain/interscapular pain: Presented with chest pain that occurred after heavy lifting. He tells me that he is chest pain free now. EKG at baseline with ST elevation in inferior leads. He says that he had an MI many years ago but never got a stent. It is very possible that he has CAD, given his long history of smoking and ETOH abuse.   He has bilateral lower extremity edema and describes PND. Would get an Echo to evaluate for cardiomyopathy as he has a long history of ETOH abuse.   His troponin is negative x 3. Will follow echo, if wall motion abnormalities  will need cath but he is at high risk for DAPT, as he has prominent varices along the anterior lower abdominal wall. He is on Xarelto now which means if he received a stent he would have triple therapy for a month. Further MD recommendations to follow.   2. HTN: hypertensive this am, would add metoprolol to reduce BP and with likely CAD. Metoprolol 12.5mg  BID.   3. ETOH abuse: On CIWA protocol per primary.   Signed, Arbutus Leas, NP 05/31/2016, 8:06 AM Pager: [phone]

## 2016-05-31 NOTE — Progress Notes (Signed)
   VASCULAR SURGERY ASSESSMENT & PLAN:  Denies CP. Still with some back pain, that is likely related to helping his friend move yesterday.   IVC filter has been in for 6 years, so unlikely that it moved acutely. I did not see evidence of significant hematoma where the one strut is adjacent tot he aorta. I have recommended f/u CT of abdomen in 6 weeks. Removal of the filter would be associated with significant risk for many reasons. The filter has been in for 6 years and it may be technically impossible to remove it from the cava. He has thrombocytopenia, and ETOH history with large varices in lower abdomen suggestion portal HTN.   ETOH history: shaking this AM. May need DT precautions.   SUBJECTIVE: Back pain about the same  PHYSICAL EXAM: Vitals:   05/31/16 0045 05/31/16 0525 05/31/16 0528 05/31/16 0531  BP: 135/69 (!) 153/87 (!) 156/88 (!) 153/99  Pulse: 69 90 94 99  Resp: (!) 22 20    Temp: 98.5 F (36.9 C) 98.4 F (36.9 C)    TempSrc:      SpO2: 95% 94%    Weight: 212 lb 6.4 oz (96.3 kg)     Height: 6\' 2"  (1.88 m)      Abdomen soft and non-tender.   LABS: Lab Results  Component Value Date   WBC 5.5 05/30/2016   HGB 13.6 05/30/2016   HCT 40.8 05/30/2016   MCV 101.5 (H) 05/30/2016   PLT 102 (L) 05/30/2016   Lab Results  Component Value Date   CREATININE 0.71 05/31/2016   Lab Results  Component Value Date   INR 1.00 05/30/2016   PROTIME 15.6 (H) 12/22/2012   CBG (last 3)   Recent Labs  05/31/16 0719  GLUCAP 134*    Principal Problem:   Chest pain Active Problems:   Tobacco abuse   Alcohol dependence ([facility])   Left leg DVT ([facility])   COPD exacerbation ([place])   Syncope and collapse   Low back pain    Gae Gallop Beeper: [id number] 05/31/2016
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
## 2016-06-01 ENCOUNTER — Other Ambulatory Visit: Payer: Self-pay | Admitting: *Deleted

## 2016-06-01 ENCOUNTER — Observation Stay (HOSPITAL_COMMUNITY): Payer: Self-pay

## 2016-06-01 ENCOUNTER — Observation Stay (HOSPITAL_BASED_OUTPATIENT_CLINIC_OR_DEPARTMENT_OTHER): Payer: Self-pay

## 2016-06-01 DIAGNOSIS — F1024 Alcohol dependence with alcohol-induced mood disorder: Secondary | ICD-10-CM

## 2016-06-01 DIAGNOSIS — I824Z9 Acute embolism and thrombosis of unspecified deep veins of unspecified distal lower extremity: Secondary | ICD-10-CM

## 2016-06-01 DIAGNOSIS — R079 Chest pain, unspecified: Secondary | ICD-10-CM

## 2016-06-01 DIAGNOSIS — Z95828 Presence of other vascular implants and grafts: Secondary | ICD-10-CM

## 2016-06-01 DIAGNOSIS — I82402 Acute embolism and thrombosis of unspecified deep veins of left lower extremity: Secondary | ICD-10-CM

## 2016-06-01 DIAGNOSIS — J441 Chronic obstructive pulmonary disease with (acute) exacerbation: Secondary | ICD-10-CM

## 2016-06-01 DIAGNOSIS — R55 Syncope and collapse: Secondary | ICD-10-CM

## 2016-06-01 LAB — NM MYOCAR MULTI W/SPECT W/WALL MOTION / EF
CHL CUP NUCLEAR SRS: 0
CHL CUP RESTING HR STRESS: 60 {beats}/min
CHL CUP STRESS STAGE 1 GRADE: 0 %
CHL CUP STRESS STAGE 1 HR: 70 {beats}/min
CHL CUP STRESS STAGE 1 SPEED: 0 mph
CHL CUP STRESS STAGE 2 HR: 70 {beats}/min
CHL CUP STRESS STAGE 3 SBP: 141 mmHg
CHL CUP STRESS STAGE 3 SPEED: 0 mph
CHL CUP STRESS STAGE 4 DBP: 81 mmHg
CHL CUP STRESS STAGE 4 GRADE: 0 %
CHL CUP STRESS STAGE 4 SBP: 136 mmHg
CSEPED: 5 min
LV dias vol: 118 mL (ref 62–150)
LV sys vol: 51 mL
MPHR: 169 {beats}/min
Percent HR: 56 %
RATE: 0.3
SSS: 0
Stage 2 Grade: 0 %
Stage 2 Speed: 0 mph
Stage 3 DBP: 82 mmHg
Stage 3 Grade: 0 %
Stage 3 HR: 92 {beats}/min
Stage 4 HR: 88 {beats}/min
Stage 4 Speed: 0 mph
TID: 1.18

## 2016-06-01 MED ORDER — IPRATROPIUM-ALBUTEROL 0.5-2.5 (3) MG/3ML IN SOLN
3.0000 mL | Freq: Three times a day (TID) | RESPIRATORY_TRACT | Status: DC
  Administered 2016-06-01: 3 mL via RESPIRATORY_TRACT
  Filled 2016-06-01 (×2): qty 3

## 2016-06-01 MED ORDER — TECHNETIUM TC 99M TETROFOSMIN IV KIT
10.0000 | PACK | Freq: Once | INTRAVENOUS | Status: AC | PRN
  Administered 2016-06-01: 10 via INTRAVENOUS

## 2016-06-01 MED ORDER — REGADENOSON 0.4 MG/5ML IV SOLN
INTRAVENOUS | Status: AC
  Filled 2016-06-01: qty 5

## 2016-06-01 MED ORDER — TRAZODONE HCL 50 MG PO TABS
50.0000 mg | ORAL_TABLET | Freq: Every evening | ORAL | Status: DC | PRN
  Administered 2016-06-01: 50 mg via ORAL
  Filled 2016-06-01: qty 1

## 2016-06-01 MED ORDER — TECHNETIUM TC 99M TETROFOSMIN IV KIT
30.0000 | PACK | Freq: Once | INTRAVENOUS | Status: AC | PRN
  Administered 2016-06-01: 30 via INTRAVENOUS

## 2016-06-01 MED ORDER — PREDNISONE 20 MG PO TABS
30.0000 mg | ORAL_TABLET | Freq: Every day | ORAL | Status: DC
  Administered 2016-06-02: 30 mg via ORAL
  Filled 2016-06-01: qty 1

## 2016-06-01 MED ORDER — REGADENOSON 0.4 MG/5ML IV SOLN
0.4000 mg | Freq: Once | INTRAVENOUS | Status: AC
  Administered 2016-06-01: 0.4 mg via INTRAVENOUS
  Filled 2016-06-01: qty 5

## 2016-06-01 NOTE — Progress Notes (Signed)
   VASCULAR SURGERY ASSESSMENT & PLAN:  His back pain has resolved. His cardiac workup is in progress.  I will arrange a follow up CT scan of his abdomen in 6 weeks to follow up on the IVC filter.  Vascular surgery will be available as needed.  SUBJECTIVE: No significant back pain.  PHYSICAL EXAM: Vitals:   05/31/16 2300 06/01/16 0400 06/01/16 0614 06/01/16 0900  BP: 113/63  139/84 (!) 142/84  Pulse: 69 74 81 80  Resp:   19 18  Temp:   98.1 F (36.7 C) 98.2 F (36.8 C)  TempSrc:    Oral  SpO2:   95% 96%  Weight:   211 lb 3.2 oz (95.8 kg)   Height:       Abdomen soft and nontender.  LABS: Lab Results  Component Value Date   WBC 5.5 05/30/2016   HGB 13.6 05/30/2016   HCT 40.8 05/30/2016   MCV 101.5 (H) 05/30/2016   PLT 102 (L) 05/30/2016   Lab Results  Component Value Date   CREATININE 0.71 05/31/2016   Lab Results  Component Value Date   INR 1.00 05/30/2016   PROTIME 15.6 (H) 12/22/2012   CBG (last 3)   Recent Labs  05/31/16 0719  GLUCAP 134*    Principal Problem:   Chest pain Active Problems:   Tobacco abuse   Alcohol dependence (HCC)   Left leg DVT (HCC)   COPD exacerbation (HCC)   Syncope and collapse   Low back pain   Pain in the chest   Gae Gallop Beeper: [id number] 06/01/2016

## 2016-06-01 NOTE — Progress Notes (Signed)
DAILY PROGRESS NOTE  Subjective:  Shaky today - feels nauseated, suspect etoh withdrawal symptoms. Chest pain improved, but not totally resolved. Ruled out for ACS by 4 negative troponins.  Objective:  Temp:  [98.1 F (36.7 C)-98.7 F (37.1 C)] 98.1 F (36.7 C) (09/25 0614) Pulse Rate:  [69-88] 81 (09/25 0614) Resp:  [18-19] 19 (09/25 0614) BP: (113-139)/(63-88) 139/84 (09/25 0614) SpO2:  [91 %-95 %] 95 % (09/25 0614) Weight:  [211 lb 3.2 oz (95.8 kg)] 211 lb 3.2 oz (95.8 kg) (09/25 0614) Weight change: -8 lb 12.8 oz (-3.992 kg)  Intake/Output from previous day: 09/24 0701 - 09/25 0700 In: 720 [P.O.:720] Out: 1050 [Urine:1050]  Intake/Output from this shift: Total I/O In: 120 [P.O.:120] Out: -   Medications: No current facility-administered medications on file prior to encounter.    Current Outpatient Prescriptions on File Prior to Encounter  Medication Sig Dispense Refill  . rivaroxaban (XARELTO) 20 MG TABS tablet Take 1 tablet (20 mg total) by mouth daily with breakfast. 90 tablet 3  . carvedilol (COREG) 6.25 MG tablet Take 1 tablet (6.25 mg total) by mouth 2 (two) times daily with a meal. For high blood pressure (Patient not taking: Reported on 05/30/2016) 60 tablet 0  . citalopram (CELEXA) 40 MG tablet Take 1 tablet (40 mg total) by mouth daily. (Patient not taking: Reported on 05/30/2016) 30 tablet 1  . diclofenac sodium (VOLTAREN) 1 % GEL Apply 2 g topically 4 (four) times daily. For arthritic pain (Patient not taking: Reported on 05/30/2016)    . folic acid (FOLVITE) 1 MG tablet Take 1 tablet (1 mg total) by mouth daily. For low folate (Patient not taking: Reported on 05/30/2016) 30 tablet 0  . hydrOXYzine (ATARAX/VISTARIL) 25 MG tablet Take 1 tablet (25 mg total) by mouth 3 (three) times daily as needed for anxiety. (Patient not taking: Reported on 05/30/2016) 90 tablet 0  . nicotine (NICODERM CQ - DOSED IN MG/24 HOURS) 21 mg/24hr patch Place 1 patch (21 mg total) onto  the skin daily at 6 (six) AM. For smoking cessation (Patient not taking: Reported on 05/30/2016) 28 patch 0  . thiamine 100 MG tablet Take 1 tablet (100 mg total) by mouth daily. For low Thiamine (Patient not taking: Reported on 05/30/2016) 30 tablet 0  . traZODone (DESYREL) 150 MG tablet Take 1 tablet (150 mg total) by mouth at bedtime. For sleep (Patient not taking: Reported on 05/30/2016) 30 tablet 0    Physical Exam: General appearance: alert and slowed mentation Neck: no carotid bruit and no JVD Lungs: clear to auscultation bilaterally Heart: regular rate and rhythm Abdomen: soft, non-tender; bowel sounds normal; no masses,  no organomegaly Extremities: venous stasis dermatitis noted Pulses: 2+ and symmetric Skin: ecchymosis and excoriations Neurologic: Mental status: Awakens to voice, communicative, tremulous  Lab Results: Results for orders placed or performed during the hospital encounter of 05/30/16 (from the past 48 hour(s))  Type and screen [place]     Status: None   Collection Time: 05/30/16  5:35 PM  Result Value Ref Range   ABO/RH(D) A POS    Antibody Screen NEG    Sample Expiration 06/02/2016   I-Stat Troponin, ED (not at [facility])     Status: None   Collection Time: 05/30/16  5:43 PM  Result Value Ref Range   Troponin i, poc 0.00 0.00 - 0.08 ng/mL   Comment 3            Comment: Due  to the release kinetics of cTnI, a negative result within the first hours of the onset of symptoms does not rule out myocardial infarction with certainty. If myocardial infarction is still suspected, repeat the test at appropriate intervals.   CBC with Differential     Status: Abnormal   Collection Time: 05/30/16  5:45 PM  Result Value Ref Range   WBC 5.5 4.0 - 10.5 K/uL   RBC 4.02 (L) 4.22 - 5.81 MIL/uL   Hemoglobin 13.6 13.0 - 17.0 g/dL   HCT 40.8 39.0 - 52.0 %   MCV 101.5 (H) 78.0 - 100.0 fL   MCH 33.8 26.0 - 34.0 pg   MCHC 33.3 30.0 - 36.0 g/dL   RDW 15.4 11.5  - 15.5 %   Platelets 102 (L) 150 - 400 K/uL    Comment: REPEATED TO VERIFY PLATELET COUNT CONFIRMED BY SMEAR    Neutrophils Relative % 58 %   Neutro Abs 3.2 1.7 - 7.7 K/uL   Lymphocytes Relative 30 %   Lymphs Abs 1.6 0.7 - 4.0 K/uL   Monocytes Relative 9 %   Monocytes Absolute 0.5 0.1 - 1.0 K/uL   Eosinophils Relative 2 %   Eosinophils Absolute 0.1 0.0 - 0.7 K/uL   Basophils Relative 1 %   Basophils Absolute 0.1 0.0 - 0.1 K/uL  Basic metabolic panel     Status: Abnormal   Collection Time: 05/30/16  5:45 PM  Result Value Ref Range   Sodium 139 135 - 145 mmol/L   Potassium 3.9 3.5 - 5.1 mmol/L   Chloride 108 101 - 111 mmol/L   CO2 22 22 - 32 mmol/L   Glucose, Bld 89 65 - 99 mg/dL   BUN <5 (L) 6 - 20 mg/dL   Creatinine, Ser 0.64 0.61 - 1.24 mg/dL   Calcium 8.2 (L) 8.9 - 10.3 mg/dL   GFR calc non Af Amer >60 >60 mL/min   GFR calc Af Amer >60 >60 mL/min    Comment: (NOTE) The eGFR has been calculated using the CKD EPI equation. This calculation has not been validated in all clinical situations. eGFR's persistently <60 mL/min signify possible Chronic Kidney Disease.    Anion gap 9 5 - 15  Protime-INR     Status: None   Collection Time: 05/30/16  5:45 PM  Result Value Ref Range   Prothrombin Time 13.2 11.4 - 15.2 seconds   INR 1.00   APTT     Status: None   Collection Time: 05/30/16  5:45 PM  Result Value Ref Range   aPTT 28 24 - 36 seconds  Ethanol     Status: Abnormal   Collection Time: 05/30/16  5:45 PM  Result Value Ref Range   Alcohol, Ethyl (B) 261 (H) <5 mg/dL    Comment:        LOWEST DETECTABLE LIMIT FOR SERUM ALCOHOL IS 5 mg/dL FOR MEDICAL PURPOSES ONLY   I-Stat CG4 Lactic Acid, ED     Status: Abnormal   Collection Time: 05/30/16  5:45 PM  Result Value Ref Range   Lactic Acid, Venous 2.21 (HH) 0.5 - 1.9 mmol/L   Comment NOTIFIED PHYSICIAN   Hepatic function panel     Status: Abnormal   Collection Time: 05/31/16  1:34 AM  Result Value Ref Range   Total  Protein 6.5 6.5 - 8.1 g/dL   Albumin 3.1 (L) 3.5 - 5.0 g/dL   AST 92 (H) 15 - 41 U/L   ALT 72 (H) 17 - 63  U/L   Alkaline Phosphatase 84 38 - 126 U/L   Total Bilirubin 0.6 0.3 - 1.2 mg/dL   Bilirubin, Direct 0.1 0.1 - 0.5 mg/dL   Indirect Bilirubin 0.5 0.3 - 0.9 mg/dL  Troponin I     Status: None   Collection Time: 05/31/16  1:34 AM  Result Value Ref Range   Troponin I <0.03 <0.03 ng/mL  Lactic acid, plasma     Status: None   Collection Time: 05/31/16  1:34 AM  Result Value Ref Range   Lactic Acid, Venous 1.8 0.5 - 1.9 mmol/L  Troponin I     Status: None   Collection Time: 05/31/16  6:08 AM  Result Value Ref Range   Troponin I <0.03 <0.03 ng/mL  Comprehensive metabolic panel     Status: Abnormal   Collection Time: 05/31/16  6:08 AM  Result Value Ref Range   Sodium 137 135 - 145 mmol/L   Potassium 4.2 3.5 - 5.1 mmol/L   Chloride 106 101 - 111 mmol/L   CO2 22 22 - 32 mmol/L   Glucose, Bld 138 (H) 65 - 99 mg/dL   BUN <5 (L) 6 - 20 mg/dL   Creatinine, Ser 0.71 0.61 - 1.24 mg/dL   Calcium 8.1 (L) 8.9 - 10.3 mg/dL   Total Protein 6.7 6.5 - 8.1 g/dL   Albumin 3.1 (L) 3.5 - 5.0 g/dL   AST 84 (H) 15 - 41 U/L   ALT 71 (H) 17 - 63 U/L   Alkaline Phosphatase 88 38 - 126 U/L   Total Bilirubin 0.5 0.3 - 1.2 mg/dL   GFR calc non Af Amer >60 >60 mL/min   GFR calc Af Amer >60 >60 mL/min    Comment: (NOTE) The eGFR has been calculated using the CKD EPI equation. This calculation has not been validated in all clinical situations. eGFR's persistently <60 mL/min signify possible Chronic Kidney Disease.    Anion gap 9 5 - 15  Glucose, capillary     Status: Abnormal   Collection Time: 05/31/16  7:19 AM  Result Value Ref Range   Glucose-Capillary 134 (H) 65 - 99 mg/dL  Troponin I     Status: None   Collection Time: 05/31/16 11:55 AM  Result Value Ref Range   Troponin I <0.03 <0.03 ng/mL    Imaging: Dg Lumbar Spine 2-3 Views  Result Date: 05/31/2016 CLINICAL DATA:  Initial encounter  for Patient was carrying heavy objects yesterday and he fell later that day (he thinks he might have slipped a disc in his spine). EXAM: LUMBAR SPINE - 2-3 VIEW COMPARISON:  CT of 05/03/2015 FINDINGS: Five lumbar type vertebral bodies. IVC filter. Sacroiliac joints are symmetric. Maintenance of vertebral body height and alignment. Loss of intervertebral disc height at L4-5 and L5-S1. Facet arthropathy at these levels. IMPRESSION: Spondylosis/ degenerative disc disease. No acute osseous abnormality. Electronically Signed   By: Abigail Miyamoto M.D.   On: 05/31/2016 11:22   Ct Angio Chest/abd/pel For Dissection W And/or Wo Contrast  Result Date: 05/30/2016 CLINICAL DATA:  Acute onset of generalized chest pain and back pain. Dyspnea and syncope. Patient on Xarelto. Initial encounter. EXAM: CT ANGIOGRAPHY CHEST, ABDOMEN AND PELVIS TECHNIQUE: Multidetector CT imaging through the chest, abdomen and pelvis was performed using the standard protocol during bolus administration of intravenous contrast. Multiplanar reconstructed images and MIPs were obtained and reviewed to evaluate the vascular anatomy. CONTRAST:  100 mL of Isovue 370 IV contrast COMPARISON:  CTA of the chest performed 04/22/2016,  and CT of the abdomen and pelvis from 05/03/2015 FINDINGS: CTA CHEST FINDINGS Cardiovascular: There is no evidence of aortic dissection. There is no evidence of aneurysmal dilatation. Minimal calcification is seen at the proximal left subclavian artery. Mild coronary artery calcification is noted. No additional calcific atherosclerotic disease is seen. The great vessels are otherwise unremarkable in appearance. There is no evidence of significant pulmonary embolus. Mediastinum/Nodes: The mediastinum is unremarkable in appearance. No mediastinal lymphadenopathy is seen. No pericardial effusion is identified. The thyroid gland is unremarkable. No axillary lymphadenopathy is appreciated. Lungs/Pleura: Minimal scarring is noted at the  lung bases. The lungs are otherwise clear bilaterally. A calcified granuloma is noted at superior aspect of the left lower lobe. No pleural effusion or pneumothorax is seen. No masses are identified. Musculoskeletal: No acute osseous abnormalities are identified. The visualized musculature is unremarkable in appearance. Review of the MIP images confirms the above findings. CTA ABDOMEN AND PELVIS FINDINGS VASCULAR Aorta: Mild scattered calcification is seen along the abdominal aorta. The abdominal aorta remains fully patent. Celiac: The celiac trunk is patent and unremarkable in appearance. SMA: The superior mesenteric artery remains fully patent. Renals: The renal arteries are patent and unremarkable in appearance. IMA: The inferior mesenteric artery remains fully patent. Inflow: The common iliac arteries, and external and internal iliac arteries, are grossly unremarkable, aside from mild calcification along the left common iliac artery. The common femoral arteries and their visualized branches are grossly unremarkable. Veins: The visualized venous structures are grossly unremarkable. Prominent varices are incidentally noted along the anterior lower abdominal and pelvic wall, extending into the inguinal regions bilaterally. An IVC filter is noted in expected position, with prongs extending into surrounding soft tissues. One of the prongs appears to extend through the outer wall of the adjacent abdominal aorta, abutting the intimal layer, and there is new wall thickening at the right side of the abdominal aorta at this level measuring 4 mm in thickness, concerning for mild focal intramural hematoma surrounding the prong. There is minimal surrounding soft tissue inflammation, and aortitis secondary to the IVC prong could have a similar appearance. Review of the MIP images confirms the above findings. NON-VASCULAR Hepatobiliary: The liver is unremarkable in appearance. The gallbladder is unremarkable in appearance. The  common bile duct remains normal in caliber. Pancreas: The pancreas is within normal limits. Spleen: The spleen is unremarkable in appearance. Adrenals/Urinary Tract: The adrenal glands are unremarkable in appearance. The kidneys are within normal limits. There is no evidence of hydronephrosis. No renal or ureteral stones are identified. Perinephric stranding is noted bilaterally. Stomach/Bowel: The stomach is unremarkable in appearance. The small bowel is within normal limits. The appendix is normal in caliber, without evidence of appendicitis. The colon is unremarkable in appearance. Lymphatic: Visualized retroperitoneal nodes remain normal in size. The pelvic sidewalls are grossly unremarkable. Musculoskeletal: No acute osseous abnormalities are identified. The visualized musculature is unremarkable in appearance. Other: No additional soft tissue abnormalities are seen. Review of the MIP images confirms the above findings. IMPRESSION: 1. No evidence of aortic dissection. No evidence of aneurysmal dilatation. Mild aortic atherosclerosis noted. 2. No evidence of significant pulmonary embolus. 3. One of the prongs of the patient's IVC filter appears to extend through the outer wall of the adjacent abdominal aorta, abutting the intimal layer. There is new wall thickening at the right side of the abdominal aorta at this level measuring 4 mm in thickness, concerning for mild focal intramural hematoma surrounding the prong. There is minimal surrounding soft  tissue inflammation. Aortitis secondary to the protruding IVC prong could have a similar appearance. Surgical consultation is recommended; would consider careful removal and replacement of the IVC filter, though there is risk of aortic rupture given this appearance. 4. Mild coronary artery calcification noted. 5. Prominent varices incidentally noted along the anterior lower abdominal and pelvic wall, extending into the inguinal regions bilaterally. These results were  called by telephone at the time of interpretation on 05/30/2016 at 9:10 pm to Dr. Brantley Stage, who verbally acknowledged these results. Electronically Signed   By: Garald Balding M.D.   On: 05/30/2016 21:14    Assessment:  1. Principal Problem: 2.   Chest pain 3. Active Problems: 4.   Tobacco abuse 5.   Alcohol dependence ([place]) 6.   Left leg DVT ([place]) 7.   COPD exacerbation ([place]) 8.   Syncope and collapse 9.   Low back pain 10.   Pain in the chest 11.   Plan:  1. Chest pain is atypical and has improved. R/o for ACS. Would risk stratify with lexican myoview today since echo has not been performed. He has been NPO p MN. He will likely need additional ativan prior to the procedure.  Time Spent Directly with Patient:  15 minutes  Length of Stay:  LOS: 0 days   Pixie Casino, MD, [facility] Attending Cardiologist [place] 06/01/2016, 8:41 AM

## 2016-06-01 NOTE — Progress Notes (Signed)
Stress test images and report reviewed. This was negative for ischemia with normal LV function. No further cardiac work-up is recommended at this point. Cardiology will sign-off. Call with questions.  Pixie Casino, MD, [facility] Attending Cardiologist [place]

## 2016-06-01 NOTE — Progress Notes (Signed)
   [name] presented for a [place] today.  No immediate complications.  Stress imaging is pending at this time.  [name], NP 06/01/2016, 1:34 PM

## 2016-06-01 NOTE — Hospital Discharge Follow-Up (Signed)
Transitional Care Clinic Care Coordination Note:  Admit date:  05/30/2016 Discharge date: TBD Discharge Disposition: homeless Patient contact: no phone. He stated to call his emergency contact  - [name] and [name] will inform him of the call and will allow him to return the call on his ( [name]'s ) phone.  Emergency contact(s): [name], [facility]) # [phone] ( office)  # [phone] ( mobile).  This CM explained to the patient that a [place] CM would be calling him 24-48 hours after discharge and he was agreeable to the ca;;.   This Case Manager reviewed patient's EMR and determined patient would benefit from post-discharge medical management and chronic care management services through the [facility]. 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  He has been hospitalized 5 times in the last year and had 1 ED visit in the last year. He was admitted with chest pain, syncope and low back pain. . This Case Manager met with patient to discuss the services and medical management that can be provided at the [facility].He stated that he has missed appointments at the [facility] in the past but  agreed to receive post-discharge care at the [facility]  Patient scheduled for Transitional Care appointment on 06/09/2016 @ 0930.  Clinic information and appointment time provided to patient. Appointment information also placed on AVS.  Assessment:       Home Environment: homeless for 12 years.        Support System: he stated that he has no family /friends for support. The only support that he has is [name] at the [facility].        Level of functioning: independent       Home DME: none       Home care services: (services arranged prior to discharge or new services after discharge) none       Transportation:takes the bus      Food/Nutrition: (ability to afford, access, use of any community resources) Denied any difficulty obtaining food. Stated that he receives $194/month in food stamps and he has the information about free food pantries and free meals that are offered in [place].         Medications: (ability to afford, access, compliance, Pharmacy used) He said that he would use the [facility]. He stated that he was given an month supply of xarelto when he was last discharged from the hospital and he has about a 1 week supply left.      Identified Barriers: No PCP, homeless, no support system in the community . No phone, no income,  Relies on the bus for transportation, questionable compliance with medication regime, no insurance. Marland Kitchen         PCP (Name, office location, phone number): No PCP  Patient Education: Explained the services provided at the [facility] including pharmacy assistance, social work and financial counseling.  Informed him that the could benefit from meeting with the financial counselor to discuss application for the [employer] and [employer] .. Discussed the importance of continuing to follow up with his [place] worker for assistance with shelter/ housing. He noted that his is aware of the services that are provided at the [facility].  He stated that he has applied for disability and has been denied 4 times.  Arranged services:        Services communicated to: [name] Krauss, RN CM

## 2016-06-01 NOTE — Progress Notes (Signed)
PROGRESS NOTE        PATIENT DETAILS Name: [name] Age: 50 y.o. Sex: male Date of Birth: 10-25-1965 Admit Date: 05/30/2016 Admitting Physician Lily Kocher, MD PCP:No PCP Per Patient  Brief Narrative: Patient is a 50 y.o. 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 Admitted for further evaluation and treatment  Subjective: No further chest pain. Back pain is much better he is not able to easily sit up. Still tremulous-but awake and alert.   Assessment/Plan: Principal Problem: Chest pain: Mostly with atypical features.CT angiogram of the chest negative for large PE and aortic dissection. Cardiac enzymes negative, cardiology planning on nuclear stress test today. Follow.  Active Problems: Syncope:suspect orthostatics-monitor in tele, await Echo  Low back pain: Much improved with supportive care. Suspect musculoskeletal-nonfocal exam.x-rays of the lumbar spine shows degenerative disc disease. Furthermore, on CT angiogram of the abdomen-no major musculoskeletal of modalities seen. Supportive care for now and follow.   Alcohol withdrawal: Tremulous-but awake and alert. Continue Ativan per protocol  COPD exacerbation: Better, lungs are clear this morning. Continue bronchodilators and oral prednisone-will plan a rapid taper.  Possible IVC filter dislodgment: Seen by vascular surgery, recommendations for outpatient follow-up and repeat scans.  History of venous thromboembolism: Continue Xarelto.  Active tobacco CO:[id number]  Homelessness: Social work evaluation  DVT Prophylaxis: Xarelto  Code Status: Full code   Family Communication: None at bedside  Disposition Plan: Remain inpatient  Antimicrobial  agents: None  Procedures: None  CONSULTS:  cardiology  Time spent: 25 minutes-Greater than 50% of this time was spent in counseling, explanation of diagnosis, planning of further management, and coordination of care.  MEDICATIONS: Anti-infectives    None      Scheduled Meds: . folic acid  1 mg Oral Daily  . ipratropium-albuterol  3 mL Nebulization TID  . metoprolol tartrate  12.5 mg Oral BID  . multivitamin with minerals  1 tablet Oral Daily  . nicotine  21 mg Transdermal Q0600  . predniSONE  40 mg Oral Q breakfast  . rivaroxaban  20 mg Oral Q breakfast  . sodium chloride flush  3 mL Intravenous Q12H  . thiamine  100 mg Oral Daily   Or  . thiamine  100 mg Intravenous Daily   Continuous Infusions:  PRN Meds:.acetaminophen **OR** acetaminophen, ipratropium-albuterol, LORazepam, ondansetron **OR** ondansetron (ZOFRAN) IV, oxyCODONE   PHYSICAL EXAM: Vital signs: Vitals:   05/31/16 2300 06/01/16 0400 06/01/16 0614 06/01/16 0900  BP: 113/63  139/84 (!) 142/84  Pulse: 69 74 81 80  Resp:   19 18  Temp:   98.1 F (36.7 C) 98.2 F (36.8 C)  TempSrc:    Oral  SpO2:   95% 96%  Weight:   95.8 kg (211 lb 3.2 oz)   Height:       Filed Weights   05/30/16 1741 05/31/16 0045 06/01/16 0614  Weight: 99.8 kg (220 lb) 96.3 kg (212 lb 6.4 oz) 95.8 kg (211 lb 3.2 oz)   Body mass index is 27.12 kg/m.   General appearance :Awake, alert, not in any distress. Speech Clear. Not toxic Looking Eyes:, pupils equally reactive to light and accomodation,no scleral icterus.Pink conjunctiva HEENT: Atraumatic and Normocephalic  Neck: supple, no JVD. No cervical lymphadenopathy. No thyromegaly Resp:Good air entry bilaterally, no added sounds  CVS: S1 S2 regular, no murmurs.  GI: Bowel sounds present, Non tender and not distended with no gaurding, rigidity or rebound.No organomegaly Extremities: B/L Lower Ext shows no edema, both legs are warm to touch Neurology:  speech clear,Non focal,  sensation is grossly intact. Psychiatric: Normal judgment and insight. Alert and oriented x 3. Normal mood. Musculoskeletal:gait appears to be normal.No digital cyanosis Skin:No Rash, warm and dry Wounds:N/A  I have personally reviewed following labs and imaging studies  LABORATORY DATA: CBC:  Recent Labs Lab 05/30/16 1745  WBC 5.5  NEUTROABS 3.2  HGB 13.6  HCT 40.8  MCV 101.5*  PLT 102*    Basic Metabolic Panel:  Recent Labs Lab 05/30/16 1745 05/31/16 0608  NA 139 137  K 3.9 4.2  CL 108 106  CO2 22 22  GLUCOSE 89 138*  BUN <5* <5*  CREATININE 0.64 0.71  CALCIUM 8.2* 8.1*    GFR: Estimated Creatinine Clearance: 127 mL/min (by C-G formula based on SCr of 0.71 mg/dL).  Liver Function Tests:  Recent Labs Lab 05/31/16 0134 05/31/16 0608  AST 92* 84*  ALT 72* 71*  ALKPHOS 84 88  BILITOT 0.6 0.5  PROT 6.5 6.7  ALBUMIN 3.1* 3.1*   No results for input(s): LIPASE, AMYLASE in the last 168 hours. No results for input(s): AMMONIA in the last 168 hours.  Coagulation Profile:  Recent Labs Lab 05/30/16 1745  INR 1.00    Cardiac Enzymes:  Recent Labs Lab 05/31/16 0134 05/31/16 0608 05/31/16 1155  TROPONINI <0.03 <0.03 <0.03    BNP (last 3 results) No results for input(s): PROBNP in the last 8760 hours.  HbA1C: No results for input(s): HGBA1C in the last 72 hours.  CBG:  Recent Labs Lab 05/31/16 0719  GLUCAP 134*    Lipid Profile: No results for input(s): CHOL, HDL, LDLCALC, TRIG, CHOLHDL, LDLDIRECT in the last 72 hours.  Thyroid Function Tests: No results for input(s): TSH, T4TOTAL, FREET4, T3FREE, THYROIDAB in the last 72 hours.  Anemia Panel: No results for input(s): VITAMINB12, FOLATE, FERRITIN, TIBC, IRON, RETICCTPCT in the last 72 hours.  Urine analysis:    Component Value Date/Time   COLORURINE YELLOW 02/09/2016 [place] 02/09/2016 0553   LABSPEC >1.046 (H) 02/09/2016 0553   PHURINE 5.0 02/09/2016 0553    GLUCOSEU NEGATIVE 02/09/2016 0553   HGBUR NEGATIVE 02/09/2016 0553   BILIRUBINUR NEGATIVE 02/09/2016 0553   KETONESUR NEGATIVE 02/09/2016 0553   PROTEINUR NEGATIVE 02/09/2016 0553   UROBILINOGEN 0.2 04/25/2015 0245   NITRITE NEGATIVE 02/09/2016 0553   LEUKOCYTESUR NEGATIVE 02/09/2016 0553    Sepsis Labs: Lactic Acid, Venous    Component Value Date/Time   LATICACIDVEN 1.8 05/31/2016 0134    MICROBIOLOGY: No results found for this or any previous visit (from the past 240 hour(s)).  RADIOLOGY STUDIES/RESULTS: Dg Lumbar Spine 2-3 Views  Result Date: 05/31/2016 CLINICAL DATA:  Initial encounter for Patient was carrying heavy objects yesterday and he fell later that day (he thinks he might have slipped a disc in his spine). EXAM: LUMBAR SPINE - 2-3 VIEW COMPARISON:  CT of 05/03/2015 FINDINGS: Five lumbar type vertebral bodies. IVC filter. Sacroiliac joints are symmetric. Maintenance of vertebral body height and alignment. Loss of intervertebral disc height at L4-5 and L5-S1. Facet arthropathy at these levels. IMPRESSION: Spondylosis/ degenerative disc disease. No acute osseous abnormality. Electronically Signed   By: Abigail Miyamoto  M.D.   On: 05/31/2016 11:22   Ct Angio Chest/abd/pel For Dissection W And/or Wo Contrast  Result Date: 05/30/2016 CLINICAL DATA:  Acute onset of generalized chest pain and back pain. Dyspnea and syncope. Patient on Xarelto. Initial encounter. EXAM: CT ANGIOGRAPHY CHEST, ABDOMEN AND PELVIS TECHNIQUE: Multidetector CT imaging through the chest, abdomen and pelvis was performed using the standard protocol during bolus administration of intravenous contrast. Multiplanar reconstructed images and MIPs were obtained and reviewed to evaluate the vascular anatomy. CONTRAST:  100 mL of Isovue 370 IV contrast COMPARISON:  CTA of the chest performed 04/22/2016, and CT of the abdomen and pelvis from 05/03/2015 FINDINGS: CTA CHEST FINDINGS Cardiovascular: There is no evidence of  aortic dissection. There is no evidence of aneurysmal dilatation. Minimal calcification is seen at the proximal left subclavian artery. Mild coronary artery calcification is noted. No additional calcific atherosclerotic disease is seen. The great vessels are otherwise unremarkable in appearance. There is no evidence of significant pulmonary embolus. Mediastinum/Nodes: The mediastinum is unremarkable in appearance. No mediastinal lymphadenopathy is seen. No pericardial effusion is identified. The thyroid gland is unremarkable. No axillary lymphadenopathy is appreciated. Lungs/Pleura: Minimal scarring is noted at the lung bases. The lungs are otherwise clear bilaterally. A calcified granuloma is noted at superior aspect of the left lower lobe. No pleural effusion or pneumothorax is seen. No masses are identified. Musculoskeletal: No acute osseous abnormalities are identified. The visualized musculature is unremarkable in appearance. Review of the MIP images confirms the above findings. CTA ABDOMEN AND PELVIS FINDINGS VASCULAR Aorta: Mild scattered calcification is seen along the abdominal aorta. The abdominal aorta remains fully patent. Celiac: The celiac trunk is patent and unremarkable in appearance. SMA: The superior mesenteric artery remains fully patent. Renals: The renal arteries are patent and unremarkable in appearance. IMA: The inferior mesenteric artery remains fully patent. Inflow: The common iliac arteries, and external and internal iliac arteries, are grossly unremarkable, aside from mild calcification along the left common iliac artery. The common femoral arteries and their visualized branches are grossly unremarkable. Veins: The visualized venous structures are grossly unremarkable. Prominent varices are incidentally noted along the anterior lower abdominal and pelvic wall, extending into the inguinal regions bilaterally. An IVC filter is noted in expected position, with prongs extending into  surrounding soft tissues. One of the prongs appears to extend through the outer wall of the adjacent abdominal aorta, abutting the intimal layer, and there is new wall thickening at the right side of the abdominal aorta at this level measuring 4 mm in thickness, concerning for mild focal intramural hematoma surrounding the prong. There is minimal surrounding soft tissue inflammation, and aortitis secondary to the IVC prong could have a similar appearance. Review of the MIP images confirms the above findings. NON-VASCULAR Hepatobiliary: The liver is unremarkable in appearance. The gallbladder is unremarkable in appearance. The common bile duct remains normal in caliber. Pancreas: The pancreas is within normal limits. Spleen: The spleen is unremarkable in appearance. Adrenals/Urinary Tract: The adrenal glands are unremarkable in appearance. The kidneys are within normal limits. There is no evidence of hydronephrosis. No renal or ureteral stones are identified. Perinephric stranding is noted bilaterally. Stomach/Bowel: The stomach is unremarkable in appearance. The small bowel is within normal limits. The appendix is normal in caliber, without evidence of appendicitis. The colon is unremarkable in appearance. Lymphatic: Visualized retroperitoneal nodes remain normal in size. The pelvic sidewalls are grossly unremarkable. Musculoskeletal: No acute osseous abnormalities are identified. The visualized musculature is unremarkable in appearance.  Other: No additional soft tissue abnormalities are seen. Review of the MIP images confirms the above findings. IMPRESSION: 1. No evidence of aortic dissection. No evidence of aneurysmal dilatation. Mild aortic atherosclerosis noted. 2. No evidence of significant pulmonary embolus. 3. One of the prongs of the patient's IVC filter appears to extend through the outer wall of the adjacent abdominal aorta, abutting the intimal layer. There is new wall thickening at the right side of the  abdominal aorta at this level measuring 4 mm in thickness, concerning for mild focal intramural hematoma surrounding the prong. There is minimal surrounding soft tissue inflammation. Aortitis secondary to the protruding IVC prong could have a similar appearance. Surgical consultation is recommended; would consider careful removal and replacement of the IVC filter, though there is risk of aortic rupture given this appearance. 4. Mild coronary artery calcification noted. 5. Prominent varices incidentally noted along the anterior lower abdominal and pelvic wall, extending into the inguinal regions bilaterally. These results were called by telephone at the time of interpretation on 05/30/2016 at 9:10 pm to Dr. Brantley Stage, who verbally acknowledged these results. Electronically Signed   By: Garald Balding M.D.   On: 05/30/2016 21:14     LOS: 0 days   Oren Binet, MD  [facility] Pager:[phone]  If 7PM-7AM, please contact night-coverage [email or web address] Password [facility] 06/01/2016, 11:58 AM

## 2016-06-02 ENCOUNTER — Telehealth: Payer: Self-pay

## 2016-06-02 ENCOUNTER — Telehealth: Payer: Self-pay | Admitting: Vascular Surgery

## 2016-06-02 LAB — GLUCOSE, CAPILLARY: GLUCOSE-CAPILLARY: 96 mg/dL (ref 65–99)

## 2016-06-02 MED ORDER — CARVEDILOL 6.25 MG PO TABS: 6.2500 mg | ORAL_TABLET | Freq: Two times a day (BID) | ORAL | 0 refills | Status: DC

## 2016-06-02 MED ORDER — FOLIC ACID 1 MG PO TABS: 1.0000 mg | ORAL_TABLET | Freq: Every day | ORAL | 0 refills | Status: DC

## 2016-06-02 MED ORDER — PREDNISONE 10 MG PO TABS: ORAL_TABLET | ORAL | 0 refills | Status: DC

## 2016-06-02 MED ORDER — TRAZODONE HCL 50 MG PO TABS: 50.0000 mg | ORAL_TABLET | Freq: Every day | ORAL | 0 refills | Status: DC

## 2016-06-02 MED ORDER — TIOTROPIUM BROMIDE MONOHYDRATE 18 MCG IN CAPS: 18.0000 ug | ORAL_CAPSULE | Freq: Every day | RESPIRATORY_TRACT | 0 refills | Status: DC

## 2016-06-02 MED ORDER — DICLOFENAC SODIUM 1 % TD GEL: 2.0000 g | Freq: Four times a day (QID) | TRANSDERMAL | 0 refills | Status: DC

## 2016-06-02 MED ORDER — CITALOPRAM HYDROBROMIDE 40 MG PO TABS: 40.0000 mg | ORAL_TABLET | Freq: Every day | ORAL | 0 refills | Status: DC

## 2016-06-02 MED ORDER — ALBUTEROL SULFATE HFA 108 (90 BASE) MCG/ACT IN AERS: 2.0000 | INHALATION_SPRAY | Freq: Four times a day (QID) | RESPIRATORY_TRACT | 2 refills | Status: DC | PRN

## 2016-06-02 NOTE — Hospital Discharge Follow-Up (Signed)
This CM spoke to Katherina Right, [facility] Pharmacy Tech who stated that the [facility] does not have any paperwork for the patient to get a free supply of xarelto for a year. Voicemail message left for Jacqlyn Krauss, RN CM with that information

## 2016-06-02 NOTE — Telephone Encounter (Signed)
Unable to reach pt on any #'s provided, will mail letter to home address, has CT on 10/30 and f/u with Doc on 11/8

## 2016-06-02 NOTE — Care Management Note (Addendum)
Case Management Note  Patient Details  Name: [name] MRN: [id number] Date of Birth: 1965/12/17  Subjective/Objective: Pt presented for chest pain. Pt is homeless. CSW assisting with resources for shelter. Pt was previously taking Xarelto prior to admission. No 30 day free card to be given, pt has used before. Per pt the [facility] should have paperwork that was submitted to company in order for pt to get free year supply.                     Action/Plan: CM spoke with Pat Kocher for [facility] @ [place]. Hospital appointment was able to be scheduled. Placed on the AVS. Pt will be able to utilize the pharmacy with cost ranging from $4.00-$10.00. No further needs from CM at this time.   Expected Discharge Date:                  Expected Discharge Plan:  Home/Self Care  In-House Referral:  Clinical Social Work  Discharge planning Services  CM Consult, Follow-up appt scheduled, [facility], Medication Assistance  Post Acute Care Choice:  NA Choice offered to:  NA  DME Arranged:  N/A DME Agency:  NA  HH Arranged:  NA HH Agency:  NA  Status of Service:  Completed, signed off  If discussed at [place] of Stay Meetings, dates discussed:    Additional Comments: 1342 06-02-16 Jacqlyn Krauss, RN,BSN [phone] CM did speak with Opal Sidles Liaison for [facility] in regards to patient assistance information in regards to Xarelto. [facility] did not have paperwork for Xarelto. Now per pt he forgot he did not make it to the f/u appointment to get the paperwork completed. Pt states Cardiology had given him 2 weeks worth of samples. Both CM's in the room and pt has 8 Xarelto Pills. Both CM's stated that pt would need to show up to his f/u appointment and for the assistance with his Xarelto. Pt is followed by the [facility] CSW.CM did provide pt with a bus pass. No further needs at this time.  Bethena Roys, RN 06/02/2016, 10:28 AM

## 2016-06-02 NOTE — Clinical Social Work Note (Signed)
Clinical Social Work Assessment  Patient Details  Name: [name] MRN: [id number] Date of Birth: 1966-05-19  Date of referral:  06/02/16               Reason for consult:  Substance Use/ETOH Abuse, Housing Concerns/Homelessness                Permission sought to share information with:    Permission granted to share information::  No  Name::        Agency::     Relationship::     Contact Information:     Housing/Transportation Living arrangements for the past 2 months:  Homeless (Patient states he lives in a tent.) Source of Information:  Patient Patient Interpreter Needed:  None Criminal Activity/Legal Involvement Pertinent to Current Situation/Hospitalization:  No - Comment as needed Significant Relationships:  None Lives with:  Self Do you feel safe going back to the place where you live?  Yes Need for family participation in patient care:  No (Coment)  Care giving concerns:  The patient does not report any care giving concerns at this time.   Social Worker assessment / plan:  CSW met with patient at bedside to complete assessment. The patient shares that he has been living in a tent for several years. He reports that he has been working with a Tourist information centre manager at the [facility] on stable housing and is supposed to hear from them later this week. The patient does not have any interest in shelter resources offered by CSW. CSW and patient discussed the patient's ETOH use. The patient states that on average he drinks 48 12oz cans of beer per day, or 2 fifths of vodka. He says that he has tried to get into treatment but facilities turn him away because he is on a blood thinner. CSW explained that there are several programs that would work with him on this. CSW recommended that the patient find a residential treatment program that offers transitional housing like [facility] in [place]. The patient is reluctant and seems to lack motivation to stop drinking. Resources for substance abuse were  provided. SBIRT complete. The patient states that he plans to return to his tent at time of discharge.   Employment status:  Unemployed Forensic scientist:  Self Pay (Medicaid Pending) PT Recommendations:  Not assessed at this time Information / Referral to community resources:     Patient/Family's Response to care:  The patient is appreciative of CSW's visit and offer of assistance. He appears happy with the care he has received.  Patient/Family's Understanding of and Emotional Response to Diagnosis, Current Treatment, and Prognosis:  The patient appears to have a good understanding of his diagnosis and reason for admission. He seems to be in the pre-contemplative stage of change at this time in regards to his alcohol use. He wants to discharge from the hospital.   Emotional Assessment Appearance:  Appears older than stated age Attitude/Demeanor/Rapport:  Other (The patient is appropriate and welcoming of CSW.) Affect (typically observed):  Accepting, Calm, Appropriate, Pleasant Orientation:  Oriented to Self, Oriented to Place, Oriented to  Time, Oriented to Situation Alcohol / Substance use:  Alcohol Use (States he drinks 48 cans per day if drinking bear and 2 fifths of vodka a day if drinking vodka.) Psych involvement (Current and /or in the community):  No (Comment)  Discharge Needs  Concerns to be addressed:  Discharge Planning Concerns, Substance Abuse Concerns, Homelessness Readmission within the last 30 days:  No  Current discharge risk:  Substance Abuse, Homeless Barriers to Discharge:  No Barriers Identified   ,  B, LCSW 06/02/2016, 11:28 AM  

## 2016-06-02 NOTE — Telephone Encounter (Signed)
This CM spoke to [place], [facility] Pharmacist who stated that the the pharmacy can  help him complete the paperwork for prescription assistance program from the [employer].   Met with the patient and Jacqlyn Krauss, RN CM prior to his discharge and stressed the importance of keeping his appointment at [facility] on 06/09/16 as he will need more xarelto and will need to complete the paperwork for the prescription assistance for xarelto. He stated that he had a week's supply of xarelto and he had the bottle of medication with him. He then counted 8 xarelto tablets that were left in the bottle. He stated that he never made it to his appointment with the cardiologist at [facility] when he was supposed to complete the paperwork for the free supply of xarelto for a year.  Reminded him that a [place] CM would be calling him 24-48 hours after discharge to check on him and review his medications. This CM then reviewed the process that the CM would call [name], SW at [facility] and notify him that the CM would like to speak to hm ( patient).  [name] would then notify the patient when he sees him and the patient would return the call. The patient confirmed that process and stated that Cristie Hem drives to the campsite where the patient stays and frequently checks on those who reside there.  Again stressed with the patient the importance of keeping the appointment at [facility] on 06/09/16.

## 2016-06-02 NOTE — Discharge Summary (Signed)
PATIENT DETAILS Name: [name] Age: 50 y.o. Sex: male Date of Birth: Sep 12, 1965 MRN: [id number]. Admitting Physician: Lily Kocher, MD PCP:No PCP Per Patient  Admit Date: 05/30/2016 Discharge date: 06/02/2016  Recommendations for Outpatient Follow-up:  1. Follow up with PCP in 1-2 weeks 2. Please obtain BMP/CBC in one week  Admitted From:  Home  Disposition: [place]: No  Equipment/Devices: None  Discharge Condition: Stable  CODE STATUS: FULL CODE  Diet recommendation:  Heart Healthy   Brief Summary: See H&P, Labs, Consult and Test reports for all details in brief, Patient is a 50 y.o. 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   Brief Hospital Course: Chest pain: Mostly with atypical features.CT angiogram of the chest negative for large PE and aortic dissection. Cardiac enzymes negative, cardiology consulted during this hospital stay, underwent nuclear stress test-which was negative for ischemia. No further recommendations from cardiology.   Active Problems: Syncope:suspect orthostatics-telemetry-negative, normal LV function on nuclear stress test. No further recommendations from cardiology.   Low back pain: Much improved with supportive care. Suspect musculoskeletal-nonfocal exam.x-rays of the lumbar spine shows degenerative disc disease. Furthermore, on CT angiogram of the abdomen-no major musculoskeletal of modalities seen. Supportive care for now and follow.   Alcohol withdrawal: Much improved-awake and alert, hardly any tremors this morning. Managed with Ativan per protocol. Counseled extensively, but do not think he has any intention to quit drinking.  COPD exacerbation: Resolved, lungs are clear, continue bronchodilators,  rapidly taper of prednisone. Follow with PCP.   Possible IVC filter dislodgment: Seen by vascular surgery, recommendations for outpatient follow-up and repeat scans.  History of venous thromboembolism: Continue Xarelto.  Active tobacco [id number]  Homelessness:  on the homeless-lives with friends.   Procedures/Studies: Need a stress test 9/25>> negative  Discharge Diagnoses:  Principal Problem:   Chest pain Active Problems:   Tobacco abuse   Alcohol dependence (HCC)   Left leg DVT (HCC)   COPD exacerbation (HCC)   Syncope and collapse   Low back pain   Pain in the chest   Discharge Instructions:  Activity:  As tolerated with Full fall precautions use walker/cane & assistance as needed  Discharge Instructions    Diet - low sodium heart healthy    Complete by:  As directed    Discharge instructions    Complete by:  As directed    Follow with Primary MD  No PCP Per Patient  and other consultant's as instructed your Hospitalist MD  Please get a complete blood count and chemistry panel checked by your Primary MD at your next visit, and again as instructed by your Primary MD.  Get Medicines reviewed and adjusted: Please take all your medications with you for your next visit with your Primary MD  Laboratory/radiological data: Please request your Primary MD to go over all hospital tests and procedure/radiological results at the follow up, please ask your Primary MD to get all Hospital records sent to his/her office.  In some cases, they will be blood work, cultures and biopsy results pending at the time of your discharge. Please request that your primary care M.D. follows up on these results.  Also Note the following: If you experience worsening of your admission symptoms, develop shortness of breath, life threatening emergency, suicidal or homicidal thoughts you must  seek medical attention immediately by calling 911 or calling your MD immediately  if symptoms less  severe.  You must read complete instructions/literature along with all the possible adverse reactions/side effects for all the Medicines you take and that have been prescribed to you. Take any new Medicines after you have completely understood and accpet all the possible adverse reactions/side effects.   Do not drive when taking Pain medications or sleeping medications (Benzodaizepines)  Do not take more than prescribed Pain, Sleep and Anxiety Medications. It is not advisable to combine anxiety,sleep and pain medications without talking with your primary care practitioner  Special Instructions: If you have smoked or chewed Tobacco  in the last 2 yrs please stop smoking, stop any regular Alcohol  and or any Recreational drug use.  Wear Seat belts while driving.  Please note: You were cared for by a hospitalist during your hospital stay. Once you are discharged, your primary care physician will handle any further medical issues. Please note that NO REFILLS for any discharge medications will be authorized once you are discharged, as it is imperative that you return to your primary care physician (or establish a relationship with a primary care physician if you do not have one) for your post hospital discharge needs so that they can reassess your need for medications and monitor your lab values.   Increase activity slowly    Complete by:  As directed        Medication List    STOP taking these medications   BC FAST PAIN RELIEF 650-195-33.3 MG Pack Generic drug:  Aspirin-Salicylamide-Caffeine   hydrOXYzine 25 MG tablet Commonly known as:  ATARAX/VISTARIL   nicotine 21 mg/24hr patch Commonly known as:  NICODERM CQ - dosed in mg/24 hours     TAKE these medications   albuterol 108 (90 Base) MCG/ACT inhaler Commonly known as:  PROVENTIL HFA;VENTOLIN HFA Inhale 2 puffs into the lungs every 6 (six) hours as needed for wheezing or shortness of breath.   carvedilol 6.25 MG tablet Commonly  known as:  COREG Take 1 tablet (6.25 mg total) by mouth 2 (two) times daily with a meal. For high blood pressure   citalopram 40 MG tablet Commonly known as:  CELEXA Take 1 tablet (40 mg total) by mouth daily.   diclofenac sodium 1 % Gel Commonly known as:  VOLTAREN Apply 2 g topically 4 (four) times daily. For arthritic pain   folic acid 1 MG tablet Commonly known as:  FOLVITE Take 1 tablet (1 mg total) by mouth daily. For low folate   predniSONE 10 MG tablet Commonly known as:  DELTASONE 3 tablets (30 mg) daily for 2 days, then, 2 tablets (20 mg) daily for 2 days, then, 1 tablets (10 mg) daily for 1 days, then stop   rivaroxaban 20 MG Tabs tablet Commonly known as:  XARELTO Take 1 tablet (20 mg total) by mouth daily with breakfast.   ROLAIDS 550-110 MG Chew Generic drug:  Ca Carbonate-Mag Hydroxide Chew 2 each by mouth daily as needed (indigestion).   thiamine 100 MG tablet Take 1 tablet (100 mg total) by mouth daily. For low Thiamine   tiotropium 18 MCG inhalation capsule Commonly known as:  SPIRIVA HANDIHALER Place 1 capsule (18 mcg total) into inhaler and inhale daily.   traZODone 50 MG tablet Commonly known as:  DESYREL Take 1 tablet (50 mg total) by mouth at bedtime. For sleep What changed:  medication strength  how much to take  Follow-up [place]. Go on 06/09/2016.   Specialty:  Internal Medicine Why:  at 9:30am for an appointment with Dr Jarold Song in the [facility]. Please bring all of your medications to the appointment.  Contact information: [id number] [place] [phone]         No Known Allergies  Consultations:   cardiology  Other Procedures/Studies: Dg Lumbar Spine 2-3 Views  Result Date: 05/31/2016 CLINICAL DATA:  Initial encounter for Patient was carrying heavy objects yesterday and he fell later that day (he thinks he might  have slipped a disc in his spine). EXAM: LUMBAR SPINE - 2-3 VIEW COMPARISON:  CT of 05/03/2015 FINDINGS: Five lumbar type vertebral bodies. IVC filter. Sacroiliac joints are symmetric. Maintenance of vertebral body height and alignment. Loss of intervertebral disc height at L4-5 and L5-S1. Facet arthropathy at these levels. IMPRESSION: Spondylosis/ degenerative disc disease. No acute osseous abnormality. Electronically Signed   By: Abigail Miyamoto M.D.   On: 05/31/2016 11:22   Nm Myocar Multi W/spect W/wall Motion / Ef  Result Date: 06/01/2016  There was no ST segment deviation noted during stress.  No T wave inversion was noted during stress.  The study is normal.  This is a low risk study.  Nuclear stress EF: 57%.  Low risk stress nuclear study with normal perfusion and normal left ventricular regional and global systolic function.   Ct Angio Chest/abd/pel For Dissection W And/or Wo Contrast  Result Date: 05/30/2016 CLINICAL DATA:  Acute onset of generalized chest pain and back pain. Dyspnea and syncope. Patient on Xarelto. Initial encounter. EXAM: CT ANGIOGRAPHY CHEST, ABDOMEN AND PELVIS TECHNIQUE: Multidetector CT imaging through the chest, abdomen and pelvis was performed using the standard protocol during bolus administration of intravenous contrast. Multiplanar reconstructed images and MIPs were obtained and reviewed to evaluate the vascular anatomy. CONTRAST:  100 mL of Isovue 370 IV contrast COMPARISON:  CTA of the chest performed 04/22/2016, and CT of the abdomen and pelvis from 05/03/2015 FINDINGS: CTA CHEST FINDINGS Cardiovascular: There is no evidence of aortic dissection. There is no evidence of aneurysmal dilatation. Minimal calcification is seen at the proximal left subclavian artery. Mild coronary artery calcification is noted. No additional calcific atherosclerotic disease is seen. The great vessels are otherwise unremarkable in appearance. There is no evidence of significant pulmonary  embolus. Mediastinum/Nodes: The mediastinum is unremarkable in appearance. No mediastinal lymphadenopathy is seen. No pericardial effusion is identified. The thyroid gland is unremarkable. No axillary lymphadenopathy is appreciated. Lungs/Pleura: Minimal scarring is noted at the lung bases. The lungs are otherwise clear bilaterally. A calcified granuloma is noted at superior aspect of the left lower lobe. No pleural effusion or pneumothorax is seen. No masses are identified. Musculoskeletal: No acute osseous abnormalities are identified. The visualized musculature is unremarkable in appearance. Review of the MIP images confirms the above findings. CTA ABDOMEN AND PELVIS FINDINGS VASCULAR Aorta: Mild scattered calcification is seen along the abdominal aorta. The abdominal aorta remains fully patent. Celiac: The celiac trunk is patent and unremarkable in appearance. SMA: The superior mesenteric artery remains fully patent. Renals: The renal arteries are patent and unremarkable in appearance. IMA: The inferior mesenteric artery remains fully patent. Inflow: The common iliac arteries, and external and internal iliac arteries, are grossly unremarkable, aside from mild calcification along the left common iliac artery. The common femoral arteries and their visualized branches are grossly unremarkable. Veins: The visualized venous structures  are grossly unremarkable. Prominent varices are incidentally noted along the anterior lower abdominal and pelvic wall, extending into the inguinal regions bilaterally. An IVC filter is noted in expected position, with prongs extending into surrounding soft tissues. One of the prongs appears to extend through the outer wall of the adjacent abdominal aorta, abutting the intimal layer, and there is new wall thickening at the right side of the abdominal aorta at this level measuring 4 mm in thickness, concerning for mild focal intramural hematoma surrounding the prong. There is minimal  surrounding soft tissue inflammation, and aortitis secondary to the IVC prong could have a similar appearance. Review of the MIP images confirms the above findings. NON-VASCULAR Hepatobiliary: The liver is unremarkable in appearance. The gallbladder is unremarkable in appearance. The common bile duct remains normal in caliber. Pancreas: The pancreas is within normal limits. Spleen: The spleen is unremarkable in appearance. Adrenals/Urinary Tract: The adrenal glands are unremarkable in appearance. The kidneys are within normal limits. There is no evidence of hydronephrosis. No renal or ureteral stones are identified. Perinephric stranding is noted bilaterally. Stomach/Bowel: The stomach is unremarkable in appearance. The small bowel is within normal limits. The appendix is normal in caliber, without evidence of appendicitis. The colon is unremarkable in appearance. Lymphatic: Visualized retroperitoneal nodes remain normal in size. The pelvic sidewalls are grossly unremarkable. Musculoskeletal: No acute osseous abnormalities are identified. The visualized musculature is unremarkable in appearance. Other: No additional soft tissue abnormalities are seen. Review of the MIP images confirms the above findings. IMPRESSION: 1. No evidence of aortic dissection. No evidence of aneurysmal dilatation. Mild aortic atherosclerosis noted. 2. No evidence of significant pulmonary embolus. 3. One of the prongs of the patient's IVC filter appears to extend through the outer wall of the adjacent abdominal aorta, abutting the intimal layer. There is new wall thickening at the right side of the abdominal aorta at this level measuring 4 mm in thickness, concerning for mild focal intramural hematoma surrounding the prong. There is minimal surrounding soft tissue inflammation. Aortitis secondary to the protruding IVC prong could have a similar appearance. Surgical consultation is recommended; would consider careful removal and replacement  of the IVC filter, though there is risk of aortic rupture given this appearance. 4. Mild coronary artery calcification noted. 5. Prominent varices incidentally noted along the anterior lower abdominal and pelvic wall, extending into the inguinal regions bilaterally. These results were called by telephone at the time of interpretation on 05/30/2016 at 9:10 pm to Dr. Brantley Stage, who verbally acknowledged these results. Electronically Signed   By: Garald Balding M.D.   On: 05/30/2016 21:14     TODAY-DAY OF DISCHARGE:  Subjective:   [name] today has no headache,no chest abdominal pain,no new weakness tingling or numbness, feels much better wants to go home today.   Objective:   Blood pressure 139/85, pulse 76, temperature 98.3 F (36.8 C), resp. rate 20, height 6\' 2"  (1.88 m), weight 95.4 kg (210 lb 4.8 oz), SpO2 98 %.  Intake/Output Summary (Last 24 hours) at 06/02/16 1035 Last data filed at 06/02/16 0900  Gross per 24 hour  Intake             1563 ml  Output              950 ml  Net              613 ml   Filed Weights   05/31/16 0045 06/01/16 0614 06/02/16 0400  Weight: 96.3 kg (  212 lb 6.4 oz) 95.8 kg (211 lb 3.2 oz) 95.4 kg (210 lb 4.8 oz)    Exam: Awake Alert, Oriented *3, No new F.N deficits, Normal affect [place].AT,PERRAL Supple Neck,No JVD, No cervical lymphadenopathy appriciated.  Symmetrical Chest wall movement, Good air movement bilaterally, CTAB RRR,No Gallops,Rubs or new Murmurs, No Parasternal Heave +ve B.Sounds, Abd Soft, Non tender, No organomegaly appriciated, No rebound -guarding or rigidity. No Cyanosis, Clubbing or edema, No new Rash or bruise   PERTINENT RADIOLOGIC STUDIES: Dg Lumbar Spine 2-3 Views  Result Date: 05/31/2016 CLINICAL DATA:  Initial encounter for Patient was carrying heavy objects yesterday and he fell later that day (he thinks he might have slipped a disc in his spine). EXAM: LUMBAR SPINE - 2-3 VIEW COMPARISON:  CT of 05/03/2015 FINDINGS: Five  lumbar type vertebral bodies. IVC filter. Sacroiliac joints are symmetric. Maintenance of vertebral body height and alignment. Loss of intervertebral disc height at L4-5 and L5-S1. Facet arthropathy at these levels. IMPRESSION: Spondylosis/ degenerative disc disease. No acute osseous abnormality. Electronically Signed   By: Abigail Miyamoto M.D.   On: 05/31/2016 11:22   Nm Myocar Multi W/spect W/wall Motion / Ef  Result Date: 06/01/2016  There was no ST segment deviation noted during stress.  No T wave inversion was noted during stress.  The study is normal.  This is a low risk study.  Nuclear stress EF: 57%.  Low risk stress nuclear study with normal perfusion and normal left ventricular regional and global systolic function.   Ct Angio Chest/abd/pel For Dissection W And/or Wo Contrast  Result Date: 05/30/2016 CLINICAL DATA:  Acute onset of generalized chest pain and back pain. Dyspnea and syncope. Patient on Xarelto. Initial encounter. EXAM: CT ANGIOGRAPHY CHEST, ABDOMEN AND PELVIS TECHNIQUE: Multidetector CT imaging through the chest, abdomen and pelvis was performed using the standard protocol during bolus administration of intravenous contrast. Multiplanar reconstructed images and MIPs were obtained and reviewed to evaluate the vascular anatomy. CONTRAST:  100 mL of Isovue 370 IV contrast COMPARISON:  CTA of the chest performed 04/22/2016, and CT of the abdomen and pelvis from 05/03/2015 FINDINGS: CTA CHEST FINDINGS Cardiovascular: There is no evidence of aortic dissection. There is no evidence of aneurysmal dilatation. Minimal calcification is seen at the proximal left subclavian artery. Mild coronary artery calcification is noted. No additional calcific atherosclerotic disease is seen. The great vessels are otherwise unremarkable in appearance. There is no evidence of significant pulmonary embolus. Mediastinum/Nodes: The mediastinum is unremarkable in appearance. No mediastinal lymphadenopathy is  seen. No pericardial effusion is identified. The thyroid gland is unremarkable. No axillary lymphadenopathy is appreciated. Lungs/Pleura: Minimal scarring is noted at the lung bases. The lungs are otherwise clear bilaterally. A calcified granuloma is noted at superior aspect of the left lower lobe. No pleural effusion or pneumothorax is seen. No masses are identified. Musculoskeletal: No acute osseous abnormalities are identified. The visualized musculature is unremarkable in appearance. Review of the MIP images confirms the above findings. CTA ABDOMEN AND PELVIS FINDINGS VASCULAR Aorta: Mild scattered calcification is seen along the abdominal aorta. The abdominal aorta remains fully patent. Celiac: The celiac trunk is patent and unremarkable in appearance. SMA: The superior mesenteric artery remains fully patent. Renals: The renal arteries are patent and unremarkable in appearance. IMA: The inferior mesenteric artery remains fully patent. Inflow: The common iliac arteries, and external and internal iliac arteries, are grossly unremarkable, aside from mild calcification along the left common iliac artery. The common femoral arteries and their visualized  branches are grossly unremarkable. Veins: The visualized venous structures are grossly unremarkable. Prominent varices are incidentally noted along the anterior lower abdominal and pelvic wall, extending into the inguinal regions bilaterally. An IVC filter is noted in expected position, with prongs extending into surrounding soft tissues. One of the prongs appears to extend through the outer wall of the adjacent abdominal aorta, abutting the intimal layer, and there is new wall thickening at the right side of the abdominal aorta at this level measuring 4 mm in thickness, concerning for mild focal intramural hematoma surrounding the prong. There is minimal surrounding soft tissue inflammation, and aortitis secondary to the IVC prong could have a similar appearance.  Review of the MIP images confirms the above findings. NON-VASCULAR Hepatobiliary: The liver is unremarkable in appearance. The gallbladder is unremarkable in appearance. The common bile duct remains normal in caliber. Pancreas: The pancreas is within normal limits. Spleen: The spleen is unremarkable in appearance. Adrenals/Urinary Tract: The adrenal glands are unremarkable in appearance. The kidneys are within normal limits. There is no evidence of hydronephrosis. No renal or ureteral stones are identified. Perinephric stranding is noted bilaterally. Stomach/Bowel: The stomach is unremarkable in appearance. The small bowel is within normal limits. The appendix is normal in caliber, without evidence of appendicitis. The colon is unremarkable in appearance. Lymphatic: Visualized retroperitoneal nodes remain normal in size. The pelvic sidewalls are grossly unremarkable. Musculoskeletal: No acute osseous abnormalities are identified. The visualized musculature is unremarkable in appearance. Other: No additional soft tissue abnormalities are seen. Review of the MIP images confirms the above findings. IMPRESSION: 1. No evidence of aortic dissection. No evidence of aneurysmal dilatation. Mild aortic atherosclerosis noted. 2. No evidence of significant pulmonary embolus. 3. One of the prongs of the patient's IVC filter appears to extend through the outer wall of the adjacent abdominal aorta, abutting the intimal layer. There is new wall thickening at the right side of the abdominal aorta at this level measuring 4 mm in thickness, concerning for mild focal intramural hematoma surrounding the prong. There is minimal surrounding soft tissue inflammation. Aortitis secondary to the protruding IVC prong could have a similar appearance. Surgical consultation is recommended; would consider careful removal and replacement of the IVC filter, though there is risk of aortic rupture given this appearance. 4. Mild coronary artery  calcification noted. 5. Prominent varices incidentally noted along the anterior lower abdominal and pelvic wall, extending into the inguinal regions bilaterally. These results were called by telephone at the time of interpretation on 05/30/2016 at 9:10 pm to Dr. Brantley Stage, who verbally acknowledged these results. Electronically Signed   By: Garald Balding M.D.   On: 05/30/2016 21:14     PERTINENT LAB RESULTS: CBC:  Recent Labs  05/30/16 1745  WBC 5.5  HGB 13.6  HCT 40.8  PLT 102*   CMET CMP     Component Value Date/Time   NA 137 05/31/2016 0608   NA 141 02/07/2014 1009   K 4.2 05/31/2016 0608   K 4.5 02/07/2014 1009   CL 106 05/31/2016 0608   CL 109 (H) 01/19/2013 1247   CO2 22 05/31/2016 0608   CO2 18 (L) 02/07/2014 1009   GLUCOSE 138 (H) 05/31/2016 0608   GLUCOSE 97 02/07/2014 1009   GLUCOSE 82 01/19/2013 1247   BUN <5 (L) 05/31/2016 0608   BUN 8.4 02/07/2014 1009   CREATININE 0.71 05/31/2016 0608   CREATININE 0.8 02/07/2014 1009   CALCIUM 8.1 (L) 05/31/2016 0608   CALCIUM 9.1 02/07/2014  1009   PROT 6.7 05/31/2016 0608   PROT 7.5 02/07/2014 1009   ALBUMIN 3.1 (L) 05/31/2016 0608   ALBUMIN 4.0 02/07/2014 1009   AST 84 (H) 05/31/2016 0608   AST 25 02/07/2014 1009   ALT 71 (H) 05/31/2016 0608   ALT 21 02/07/2014 1009   ALKPHOS 88 05/31/2016 0608   ALKPHOS 72 02/07/2014 1009   BILITOT 0.5 05/31/2016 0608   BILITOT 0.33 02/07/2014 1009   GFRNONAA >60 05/31/2016 0608   GFRAA >60 05/31/2016 0608    GFR Estimated Creatinine Clearance: 127 mL/min (by C-G formula based on SCr of 0.71 mg/dL). No results for input(s): LIPASE, AMYLASE in the last 72 hours.  Recent Labs  05/31/16 0134 05/31/16 0608 05/31/16 1155  TROPONINI <0.03 <0.03 <0.03   Invalid input(s): POCBNP No results for input(s): DDIMER in the last 72 hours. No results for input(s): HGBA1C in the last 72 hours. No results for input(s): CHOL, HDL, LDLCALC, TRIG, CHOLHDL, LDLDIRECT in the last 72  hours. No results for input(s): TSH, T4TOTAL, T3FREE, THYROIDAB in the last 72 hours.  Invalid input(s): FREET3 No results for input(s): VITAMINB12, FOLATE, FERRITIN, TIBC, IRON, RETICCTPCT in the last 72 hours. Coags:  Recent Labs  05/30/16 1745  INR 1.00   Microbiology: No results found for this or any previous visit (from the past 240 hour(s)).  FURTHER DISCHARGE INSTRUCTIONS:  Get Medicines reviewed and adjusted: Please take all your medications with you for your next visit with your Primary MD  Laboratory/radiological data: Please request your Primary MD to go over all hospital tests and procedure/radiological results at the follow up, please ask your Primary MD to get all Hospital records sent to his/her office.  In some cases, they will be blood work, cultures and biopsy results pending at the time of your discharge. Please request that your primary care M.D. goes through all the records of your hospital data and follows up on these results.  Also Note the following: If you experience worsening of your admission symptoms, develop shortness of breath, life threatening emergency, suicidal or homicidal thoughts you must seek medical attention immediately by calling 911 or calling your MD immediately  if symptoms less severe.  You must read complete instructions/literature along with all the possible adverse reactions/side effects for all the Medicines you take and that have been prescribed to you. Take any new Medicines after you have completely understood and accpet all the possible adverse reactions/side effects.   Do not drive when taking Pain medications or sleeping medications (Benzodaizepines)  Do not take more than prescribed Pain, Sleep and Anxiety Medications. It is not advisable to combine anxiety,sleep and pain medications without talking with your primary care practitioner  Special Instructions: If you have smoked or chewed Tobacco  in the last 2 yrs please stop  smoking, stop any regular Alcohol  and or any Recreational drug use.  Wear Seat belts while driving.  Please note: You were cared for by a hospitalist during your hospital stay. Once you are discharged, your primary care physician will handle any further medical issues. Please note that NO REFILLS for any discharge medications will be authorized once you are discharged, as it is imperative that you return to your primary care physician (or establish a relationship with a primary care physician if you do not have one) for your post hospital discharge needs so that they can reassess your need for medications and monitor your lab values.  Total Time spent coordinating discharge including counseling, education  and face to face time equals  45 minutes.  SignedOren Binet 06/02/2016 10:35 AM

## 2016-06-02 NOTE — Telephone Encounter (Signed)
-----   Message from Mena Goes, RN sent at 06/01/2016 11:20 AM EDT ----- Regarding: 6 weeks appt w/ CT   ----- Message ----- From: Angelia Mould, MD Sent: 06/01/2016  10:47 AM To: Vvs Charge Pool Subject: charge                                         Level 1 f/u visit. He can be in [  ]'s on the list. He will need a follow up CT of the abdomen in 6 weeks with IV contrast to follow up his IVC filter. An office visit at that time. Thank you. CD

## 2016-06-03 ENCOUNTER — Telehealth: Payer: Self-pay

## 2016-06-03 NOTE — Telephone Encounter (Signed)
This Case Manager received return call from Dalbert Batman, SW at [employer]. Provided [name] with information about the [facility] at [facility] and [facility] and asked him to have patient return call to this Case Manager when possible. [name] indicated he would have patient return call to this Case Manager when he sees patient. Cristie Hem also indicated he would be able to bring patient to his upcoming [facility] appointment on 06/09/16 at 0930. Awaiting return call from patient.

## 2016-06-03 NOTE — Telephone Encounter (Signed)
Transitional Care Clinic Post-discharge Follow-Up Phone Call:  Date of Discharge: 06/02/16 Principal Discharge Diagnosis(es): Chest pain, syncope, low back pain, alcohol withdrawal, COPD exacerbation Post-discharge Communication: Patient does not have a phone. Patient informed Eden Lathe, RN CM that his emergency contact is [place], [place], [place]. Patient informed Eden Lathe, RN CM that SW would inform him of call and let him return call to Case Manager. Call placed to Dalbert Batman [phone]); unable to reach. HIPPA compliant voicemail left requesting return call. Call also placed to [phone]; unable to reach. Voicemail left requesting return call. Call Completed: No

## 2016-06-08 ENCOUNTER — Telehealth: Payer: Self-pay

## 2016-06-08 NOTE — Telephone Encounter (Signed)
Call placed to Ella Bodo, SW at [facility]. The patient gave this CM [name]'s name/ phone # as his contact person.   [name] confirmed that the patient has an appointment at [facility] tomorrow, 06/09/16 @ 0930 and he said that he will bring him to the appointment. Asked him to please have the patient bring any medications that he has to the appointment. [name] verbalized understanding and stated that he has spoken to the patient today and he ( patient ) is aware of the appointment. [name] stated that the patient uses random phones to make calls and he can " find a way to reach me."

## 2016-06-09 ENCOUNTER — Telehealth: Payer: Self-pay

## 2016-06-09 ENCOUNTER — Inpatient Hospital Stay: Payer: Self-pay | Admitting: Family Medicine

## 2016-06-09 NOTE — Telephone Encounter (Signed)
Call placed to [place] # [phone] to inquire why the patient did not make his appointment this morning.  Voice mail message left requesting a call back to # [phone] or [phone].   Message received from A. Mosely returning CM call. Requested call back to cell # [phone].  CM returned the call and Mr Deedra Ehrich informed this CM that the patient had called him multiple times prior to today to confirm that he ( Mr Deedra Ehrich) was going to be able to transport him to the clinic today.  The patient stated that he would take bus if needed but Mr Deedra Ehrich stated that he re-assured the patient that he would pick him up and transport him to the clinic.  He said that when he went to pick up the patient today, he ( patient) was drinking beer and refused to get into the car without the drink. Mr. Deedra Ehrich stated that he explained to the patient that he could not have an open container in the car and the patient refused stating that he would re-schedule the appointment. The patient has not yet called the clinic to reschedule  Mr Deedra Ehrich stated that he will continue to work with the patient and encourage him to follow up at the [facility].  He said that he usually sees him once a week but there is no set schedule.  He stated that the patient is eligible for a government issued phone and he ( Mr Deedra Ehrich) had planned  To talk to  the patient today about obtaining a phone.

## 2016-06-11 ENCOUNTER — Telehealth: Payer: Self-pay

## 2016-06-11 NOTE — Telephone Encounter (Signed)
This Case Manager placed call to Ella Bodo [phone]), [place], regarding patient. Informed him that patient has not yet called clinic to reschedule [facility] appointment. Also discussed importance of patient getting discharge medications filled if he has not done so. Additionally, patient on Xarelto-will need to complete paperwork for Xarelto Patient Assistance Program. Cristie Hem verbalized understanding and indicated he was "giving patient a couple of days to cool off." He indicated patient was upset on 06/09/16 when Cristie Hem would not take him to his appointment with an open container of alcohol. [name] indicated he is aware patient needs an appointment and will follow-up with patient on 06/12/16.

## 2016-06-24 ENCOUNTER — Emergency Department (HOSPITAL_COMMUNITY)
Admission: EM | Admit: 2016-06-24 | Discharge: 2016-06-25 | Disposition: A | Discharge status: Home / Self Care | Payer: Self-pay | Attending: Emergency Medicine | Admitting: Emergency Medicine

## 2016-06-24 ENCOUNTER — Emergency Department (HOSPITAL_COMMUNITY): Payer: Self-pay

## 2016-06-24 ENCOUNTER — Encounter (HOSPITAL_COMMUNITY): Payer: Self-pay | Admitting: *Deleted

## 2016-06-24 DIAGNOSIS — F17210 Nicotine dependence, cigarettes, uncomplicated: Secondary | ICD-10-CM | POA: Insufficient documentation

## 2016-06-24 DIAGNOSIS — M25551 Pain in right hip: Secondary | ICD-10-CM | POA: Insufficient documentation

## 2016-06-24 DIAGNOSIS — Z79899 Other long term (current) drug therapy: Secondary | ICD-10-CM | POA: Insufficient documentation

## 2016-06-24 DIAGNOSIS — J449 Chronic obstructive pulmonary disease, unspecified: Secondary | ICD-10-CM | POA: Insufficient documentation

## 2016-06-24 DIAGNOSIS — Z7951 Long term (current) use of inhaled steroids: Secondary | ICD-10-CM | POA: Insufficient documentation

## 2016-06-24 DIAGNOSIS — I2510 Atherosclerotic heart disease of native coronary artery without angina pectoris: Secondary | ICD-10-CM | POA: Insufficient documentation

## 2016-06-24 MED ORDER — HYDROCODONE-ACETAMINOPHEN 5-325 MG PO TABS: 1.0000 | ORAL_TABLET | ORAL | 0 refills | Status: DC | PRN

## 2016-06-24 NOTE — Discharge Instructions (Signed)
Take your medication as prescribed as needed for pain relief. I also recommend applying ice to affected area for 15 minutes 3-4 times daily. Please follow up with a primary care provider from the Resource Guide provided below in the next week for follow up if your symptoms have not improved. Please return to the Emergency Department if symptoms worsen or new onset of fever, numbness, tingling, groin anesthesia, loss of bowel or bladder control, abdominal pain, chest pain, urinary retention, weakness.

## 2016-06-24 NOTE — ED Triage Notes (Addendum)
Per EMS - patient is homeless and was picked up at [facility] with c/o right low back pain that radiates in to right hip.  Patient also c/o numbness and tingling in right hip.  Patient is ambulatory in triage.  Patient's vitals 122/74, HR 90, RR 18.  Patient states he has had 2 beers today.

## 2016-06-24 NOTE — ED Notes (Signed)
Pt ambulated to bathroom with no issues.

## 2016-06-24 NOTE — Progress Notes (Signed)
Patient noted to have 4 admissions within the last six months.  Patient is homeless.  Patient without insurance. Patient is being seen at the [facility] at [facility], transitional care clinic. Patient missed his last appointment at the [facility] because, "I was too drunk." [facility] informed patient the the [facility] is waiting for him to call to schedule an appointment.  EDCM discussed importance of making appointment to assist with cost of Xarelto.  Patient verbalized understanding.  Patient has given [facility] permission to email [facility] in efforts to obtain appointment. Patient reports, "I have few pills (Xarelto) left in my bag. No further EDCM need at this time.

## 2016-06-24 NOTE — ED Provider Notes (Signed)
[facility] DEPT Provider Note   CSN: [id number] Arrival date & time: 06/24/16  1337     History   Chief Complaint Chief Complaint  Patient presents with  . Hip Pain    HPI [name] is a 50 y.o. male.  Patient is a 50 year old male with history of CAD, PE (on Xarelto), lupus, alcohol abuse, hepatitis B and COPD who presents the ED with complaint of right hip pain, onset 3 days. Patient reports a few days ago he was helping move furniture and notes he fell backwards all trying to move a sofa. Denies head injury or LOC. Patient reports since falling he has had sharp pain to his right hip and buttock. He notes the pain has worsened over the past few days. He states pain is worse with movement, sitting upright or ambulating. He notes he will intermittently get pain down the back of his thigh and reports having intermittent numbness/tingling to his right leg. Denies taking any medications for his symptoms. Pt denies fever, chest pain, SOB, saddle anesthesia, loss of bowel or bladder, weakness, abdominal pain, urinary retention, IVDU, cancer or recent spinal manipulation. Pt also reports to drinking 3 beers today. He states he typically drinks two 12 packs of beer daily and notes he is not interested in detox at this time. Denies any other symptoms at this time.       Past Medical History:  Diagnosis Date  . Collagen vascular disease ([place])   . COPD (chronic obstructive pulmonary disease) ([place])   . Coronary artery disease   . Degenerative joint disease   . Degenerative joint disease of spine   . Depression   . DVT (deep venous thrombosis) ([place])   . ETOH abuse   . Hepatitis B   . 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 Pulmonary embolism ([place]) 06/2010    Patient Active Problem List   Diagnosis Date Noted  . Syncope and collapse 05/31/2016  . Low back pain 05/31/2016  . Pain in the chest   . Chest pain 05/30/2016  . History of DVT (deep vein thrombosis)   . Left  leg swelling   . COPD exacerbation ([place]) 04/22/2016  . Uncomplicated alcohol dependence ([place])   . Severe episode of recurrent major depressive disorder, without psychotic features ([place])   . Acute respiratory failure with hypoxia ([place]) 02/09/2016  . Alcohol use disorder, severe, dependence ([place]) 02/09/2016  . Major depressive disorder, recurrent, severe without psychotic features ([place])   . Alcohol abuse with intoxication ([place]) 02/10/2015  . Alcohol dependence with withdrawal, uncomplicated ([place]) [id number]  . Substance induced mood disorder ([place]) 02/09/2015  . Suicidal ideations 02/09/2015  . Alcohol dependence with alcohol-induced mood disorder ([place])   . Suicidal ideation   . GAD (generalized anxiety disorder) 09/18/2014  . Recurrent major depression-severe ([place]) 09/18/2014  . Alcohol abuse with alcohol-induced mood disorder ([place]) 09/17/2014  . Alcohol intoxication ([place])   . Left leg DVT ([place]) 06/06/2014  . Alcohol dependence ([place]) 01/27/2014  . Upper leg DVT (deep venous thromboembolism), acute ([place]) 06/12/2013  . Homeless single person 06/12/2013  . 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 Subtherapeutic international normalized ratio (INR) 12/18/2012  . Alcohol withdrawal ([place]) 11/03/2012  . Major depressive disorder, recurrent episode, severe ([place]) 11/03/2012  . Deep vein thrombosis ([place]) 04/13/2012  . Lupus anticoagulant disorder ([place]) 02/02/2012  . Degenerative joint disease   . Alcohol abuse 03/01/2011  . Tobacco abuse 03/01/2011    Past Surgical History:  Procedure Laterality Date  .  LEFT ELBOW SURGERY    . TONSILLECTOMY         Home Medications    Prior to Admission medications   Medication Sig Start Date End Date Taking? Authorizing Provider  Ca Carbonate-Mag Hydroxide (ROLAIDS) 550-110 MG CHEW Chew 2 each by mouth daily as needed (indigestion).   Yes Historical Provider, MD  carvedilol (COREG) 6.25 MG tablet Take 1 tablet (6.25 mg total) by mouth 2 (two) times  daily with a meal. For high blood pressure 06/02/16  Yes Shanker Kristeen Mans, MD  citalopram (CELEXA) 40 MG tablet Take 1 tablet (40 mg total) by mouth daily. 06/02/16  Yes Shanker Kristeen Mans, MD  hydrOXYzine (ATARAX/VISTARIL) 25 MG tablet Take 25 mg by mouth 3 (three) times daily as needed for anxiety.   Yes Historical Provider, MD  rivaroxaban (XARELTO) 20 MG TABS tablet Take 1 tablet (20 mg total) by mouth daily with breakfast. 05/06/16  Yes Elsie Stain, MD  traZODone (DESYREL) 50 MG tablet Take 1 tablet (50 mg total) by mouth at bedtime. For sleep 06/02/16  Yes Shanker Kristeen Mans, MD  albuterol (PROVENTIL HFA;VENTOLIN HFA) 108 (90 Base) MCG/ACT inhaler Inhale 2 puffs into the lungs every 6 (six) hours as needed for wheezing or shortness of breath. 06/02/16   Shanker Kristeen Mans, MD  diclofenac sodium (VOLTAREN) 1 % GEL Apply 2 g topically 4 (four) times daily. For arthritic pain Patient not taking: Reported on 06/24/2016 06/02/16   Jonetta Osgood, MD  folic acid (FOLVITE) 1 MG tablet Take 1 tablet (1 mg total) by mouth daily. For low folate Patient not taking: Reported on 06/24/2016 06/02/16   Jonetta Osgood, MD  HYDROcodone-acetaminophen (NORCO/VICODIN) 5-325 MG tablet Take 1 tablet by mouth every 4 (four) hours as needed. 06/24/16   Nona Dell, PA-C  predniSONE (DELTASONE) 10 MG tablet 3 tablets (30 mg) daily for 2 days, then, 2 tablets (20 mg) daily for 2 days, then, 1 tablets (10 mg) daily for 1 days, then stop Patient not taking: Reported on 06/24/2016 06/02/16   Jonetta Osgood, MD  thiamine 100 MG tablet Take 1 tablet (100 mg total) by mouth daily. For low Thiamine Patient not taking: Reported on 05/30/2016 02/24/16   Encarnacion Slates, NP  tiotropium (SPIRIVA HANDIHALER) 18 MCG inhalation capsule Place 1 capsule (18 mcg total) into inhaler and inhale daily. 06/02/16   Shanker Kristeen Mans, MD    Family History Family History  Problem Relation Age of Onset  . Cancer Mother      Ovarian    Social History Social History  Substance Use Topics  . Smoking status: Current Every Day Smoker    Packs/day: 2.00    Years: 30.00    Types: Cigarettes  . Smokeless tobacco: Never Used  . Alcohol use 14.4 oz/week    24 Cans of beer per week     Comment: cas of beer and fifth of vodka daily     Allergies   Review of patient's allergies indicates no known allergies.   Review of Systems Review of Systems  Musculoskeletal: Positive for arthralgias (right hip) and myalgias (right leg).  Neurological: Positive for numbness (intermittent).  All other systems reviewed and are negative.    Physical Exam Updated Vital Signs BP 152/99 (BP Location: Left Arm)   Pulse 78   Temp 98 F (36.7 C) (Oral)   Resp 20   Ht 6\' 2"  (1.88 m)   Wt 98.4 kg   SpO2 97%  BMI 27.86 kg/m   Physical Exam  Constitutional: He is oriented to person, place, and time. He appears well-developed and well-nourished. No distress.  HENT:  Head: Normocephalic and atraumatic. Head is without raccoon's eyes, without Battle's sign, without abrasion, without contusion and without laceration.  Mouth/Throat: Oropharynx is clear and moist. No oropharyngeal exudate.  Eyes: Conjunctivae and EOM are normal. Right eye exhibits no discharge. Left eye exhibits no discharge. No scleral icterus.  Neck: Normal range of motion. Neck supple.  Cardiovascular: Normal rate, regular rhythm, normal heart sounds and intact distal pulses.   Pulmonary/Chest: Effort normal and breath sounds normal. No respiratory distress. He has no wheezes. He has no rales. He exhibits no tenderness.  Abdominal: Soft. Bowel sounds are normal. He exhibits no distension and no mass. There is no tenderness. There is no rebound and no guarding. No hernia.  Musculoskeletal: Normal range of motion. He exhibits tenderness. He exhibits no edema or deformity.       Right hip: He exhibits tenderness (TTP over posterior and lateral hip). He exhibits  normal range of motion, normal strength, no swelling, no crepitus, no deformity and no laceration.       Lumbar back: He exhibits normal range of motion, no tenderness, no bony tenderness, no swelling, no edema, no deformity and no laceration.  No midline C, T, or L tenderness. Full range of motion of neck and back. Full range of motion of bilateral upper and lower extremities, with 5/5 strength. FROM of right hip. Sensation intact. 2+ radial and PT pulses. Cap refill <2 seconds. Patient able to stand and ambulate without assistance but endorses pain.  Negative straight leg raise.  Neurological: He is alert and oriented to person, place, and time. He has normal strength and normal reflexes. No sensory deficit.  Skin: Skin is warm and dry. He is not diaphoretic.  Nursing note and vitals reviewed.    ED Treatments / Results  Labs (all labs ordered are listed, but only abnormal results are displayed) Labs Reviewed - No data to display  EKG  EKG Interpretation None       Radiology Dg Hip Unilat W Or Wo Pelvis 2-3 Views Right  Result Date: 06/24/2016 CLINICAL DATA:  Two day history of right hip pain.  Recent fall EXAM: DG HIP (WITH OR WITHOUT PELVIS) 2-3V RIGHT COMPARISON:  None. FINDINGS: Frontal pelvis as well as frontal and lateral right hip images were obtained. There is no fracture or dislocation. There is slight symmetric narrowing of both hip joints. There is apparent cystic change in the right acetabulum, benign in appearance. IMPRESSION: Mild symmetric narrowing both hip joints. Cystic area in the acetabulum on the right, benign in appearance. No acute fracture or dislocation. Electronically Signed   By: Lowella Grip III M.D.   On: 06/24/2016 21:43    Procedures Procedures (including critical care time)  Medications Ordered in ED Medications  HYDROcodone-acetaminophen (NORCO/VICODIN) 5-325 MG per tablet 1 tablet (1 tablet Oral Given 06/25/16 0020)     Initial Impression  / Assessment and Plan / ED Course  I have reviewed the triage vital signs and the nursing notes.  Pertinent labs & imaging results that were available during my care of the patient were reviewed by me and considered in my medical decision making (see chart for details).  Clinical Course    Patient presents with right hip pain for the past 3 days after lifting furniture and falling backwards. Denies head injury or LOC. Reports pain is  worse with movement or ambulating. Endorses intermittent numbness to his right posterior thigh. Denies back pain. No back pain red flags. VSS. Exam revealed mild tenderness to right posterior and lateral hip. No midline spinal tenderness. Full range of motion of bilateral lower extremities. No neuro deficits. Patient able to stand and ambulate. Right hip and pelvic x-ray revealed mild symmetric narrowing of both hips, cystic area and acetabulum of right that appears benign, no acute fracture. Suspect patient's symptoms are likely muscular skeletal in etiology. Plan to discharge patient home with pain meds and symptomatic treatment. Patient given information to follow up with PCP as needed. Discussed or return precautions.  Final Clinical Impressions(s) / ED Diagnoses   Final diagnoses:  Right hip pain    New Prescriptions Discharge Medication List as of 06/24/2016 11:49 PM    START taking these medications   Details  HYDROcodone-acetaminophen (NORCO/VICODIN) 5-325 MG tablet Take 1 tablet by mouth every 4 (four) hours as needed., Starting Wed 06/24/2016, Print         Chesley Noon Allison, [place] 06/25/16 0158    Drenda Freeze, MD 06/25/16 1148

## 2016-06-25 MED ORDER — HYDROCODONE-ACETAMINOPHEN 5-325 MG PO TABS
1.0000 | ORAL_TABLET | Freq: Once | ORAL | Status: AC
  Administered 2016-06-25: 1 via ORAL
  Filled 2016-06-25: qty 1

## 2016-06-26 ENCOUNTER — Telehealth: Payer: Self-pay

## 2016-06-26 NOTE — Telephone Encounter (Signed)
This Case Manager received communication from Livia Snellen, RN CM that patient needing hospital follow-up appointment. Patient in ED on 06/24/16 with right hip pain. Patient does not have a phone so call placed to Ella Bodo, SW with [employer], to inform him to have patient contact this Case Manager so appointment could be scheduled. Call placed to [facility] at [phone]; however, voicemail full.  Also call placed to [name] at [phone]; unable to reach. Voicemail left requesting return call.

## 2016-07-02 ENCOUNTER — Telehealth: Payer: Self-pay

## 2016-07-02 NOTE — Telephone Encounter (Signed)
This Case Manager received voicemail from [name], SW at [facility], returning this Case Manager's call from 06/26/16. Call placed back to [facility], SW and informed him that patient still needing appointment at [facility]. Inquired if Cristie Hem would be able to have patient call clinic to schedule appointment when he sees patient. [name] said he is aware patient needs an appointment; however, patient did not want to speak with him the last time he did outreach. He did indicate patient has informed him that he has clinic's phone number and is aware he needs an appointment.

## 2016-07-06 ENCOUNTER — Inpatient Hospital Stay: Admit: 2016-07-06 | Payer: Self-pay

## 2016-07-14 ENCOUNTER — Encounter: Payer: Self-pay | Admitting: Vascular Surgery

## 2016-07-15 ENCOUNTER — Ambulatory Visit: Payer: Self-pay | Admitting: Vascular Surgery

## 2016-09-17 ENCOUNTER — Encounter: Payer: Self-pay | Admitting: Family Medicine

## 2016-09-17 ENCOUNTER — Ambulatory Visit: Payer: Self-pay | Attending: Family Medicine | Admitting: Family Medicine

## 2016-09-17 ENCOUNTER — Ambulatory Visit (HOSPITAL_COMMUNITY)
Admission: RE | Admit: 2016-09-17 | Discharge: 2016-09-17 | Disposition: A | Discharge status: Home / Self Care | Payer: Self-pay | Source: Ambulatory Visit | Attending: Family Medicine | Admitting: Family Medicine

## 2016-09-17 VITALS — BP 108/66 | HR 101 | Temp 97.8°F | Ht 74.0 in | Wt 228.6 lb

## 2016-09-17 DIAGNOSIS — Z79899 Other long term (current) drug therapy: Secondary | ICD-10-CM | POA: Insufficient documentation

## 2016-09-17 DIAGNOSIS — I82592 Chronic embolism and thrombosis of other specified deep vein of left lower extremity: Secondary | ICD-10-CM | POA: Insufficient documentation

## 2016-09-17 DIAGNOSIS — Z9114 Patient's other noncompliance with medication regimen: Secondary | ICD-10-CM | POA: Insufficient documentation

## 2016-09-17 DIAGNOSIS — Z86718 Personal history of other venous thrombosis and embolism: Secondary | ICD-10-CM | POA: Insufficient documentation

## 2016-09-17 DIAGNOSIS — Z7901 Long term (current) use of anticoagulants: Secondary | ICD-10-CM | POA: Insufficient documentation

## 2016-09-17 DIAGNOSIS — F1024 Alcohol dependence with alcohol-induced mood disorder: Secondary | ICD-10-CM

## 2016-09-17 DIAGNOSIS — D6862 Lupus anticoagulant syndrome: Secondary | ICD-10-CM

## 2016-09-17 DIAGNOSIS — M7989 Other specified soft tissue disorders: Secondary | ICD-10-CM

## 2016-09-17 DIAGNOSIS — Z590 Homelessness: Secondary | ICD-10-CM | POA: Insufficient documentation

## 2016-09-17 DIAGNOSIS — J441 Chronic obstructive pulmonary disease with (acute) exacerbation: Secondary | ICD-10-CM

## 2016-09-17 DIAGNOSIS — Z599 Problem related to housing and economic circumstances, unspecified: Secondary | ICD-10-CM | POA: Insufficient documentation

## 2016-09-17 DIAGNOSIS — Z86711 Personal history of pulmonary embolism: Secondary | ICD-10-CM | POA: Insufficient documentation

## 2016-09-17 DIAGNOSIS — F332 Major depressive disorder, recurrent severe without psychotic features: Secondary | ICD-10-CM

## 2016-09-17 DIAGNOSIS — I2510 Atherosclerotic heart disease of native coronary artery without angina pectoris: Secondary | ICD-10-CM | POA: Insufficient documentation

## 2016-09-17 MED ORDER — FUROSEMIDE 20 MG PO TABS: 20.0000 mg | ORAL_TABLET | Freq: Every day | ORAL | 3 refills | Status: DC

## 2016-09-17 MED ORDER — CITALOPRAM HYDROBROMIDE 40 MG PO TABS: 40.0000 mg | ORAL_TABLET | Freq: Every day | ORAL | 3 refills | Status: DC

## 2016-09-17 MED ORDER — RIVAROXABAN 20 MG PO TABS: 20.0000 mg | ORAL_TABLET | Freq: Every day | ORAL | 3 refills | Status: DC

## 2016-09-17 MED ORDER — CEPHALEXIN 500 MG PO CAPS: 500.0000 mg | ORAL_CAPSULE | Freq: Two times a day (BID) | ORAL | 0 refills | Status: DC

## 2016-09-17 MED ORDER — TIOTROPIUM BROMIDE MONOHYDRATE 18 MCG IN CAPS: 18.0000 ug | ORAL_CAPSULE | Freq: Every day | RESPIRATORY_TRACT | 3 refills | Status: DC

## 2016-09-17 MED ORDER — CARVEDILOL 3.125 MG PO TABS: 3.1250 mg | ORAL_TABLET | Freq: Two times a day (BID) | ORAL | 3 refills | Status: DC

## 2016-09-17 MED ORDER — ALBUTEROL SULFATE HFA 108 (90 BASE) MCG/ACT IN AERS: 2.0000 | INHALATION_SPRAY | Freq: Four times a day (QID) | RESPIRATORY_TRACT | 2 refills | Status: DC | PRN

## 2016-09-17 MED ORDER — HYDROXYZINE HCL 25 MG PO TABS: 25.0000 mg | ORAL_TABLET | Freq: Three times a day (TID) | ORAL | 3 refills | Status: DC | PRN

## 2016-09-17 MED FILL — XARELTO 20 MG TABLET: 20 | 30 days supply | Qty: 30 | Fill #0

## 2016-09-17 MED FILL — SPIRIVA 18 MCG CP-HANDIHALE: 18 | 30 days supply | Qty: 30 | Fill #0

## 2016-09-17 MED FILL — CARVEDILOL 3.125 MG TABLET: 3.125 | 30 days supply | Qty: 60 | Fill #0

## 2016-09-17 MED FILL — VENTOLIN HFA 90 MCG INHALER: 108 (90 BAS | 20 days supply | Qty: 18 | Fill #0

## 2016-09-17 MED FILL — hydrOXYzine HCL 25 MG TABS: 25 | 20 days supply | Qty: 60 | Fill #0

## 2016-09-17 MED FILL — CEPHALEXIN 500 MG CAPSULE: 500 | 10 days supply | Qty: 20 | Fill #0

## 2016-09-17 MED FILL — CITALOPRAM HBR 40 MG TABLET: 40 | 30 days supply | Qty: 30 | Fill #0

## 2016-09-17 MED FILL — ?FUROSEMIDE 20 MG TABLET: 20 | 30 days supply | Qty: 30 | Fill #0

## 2016-09-17 NOTE — Progress Notes (Signed)
Preliminary results by tech- Left Lower Ext. Venous Duplex Completed. No evidence of acute deep or superficial vein thrombosis. Chronic appearing deep vein thrombosis is still present in the femoral vein and popliteal veins.  Left message in voice mail to call. Oda Cogan, BS, RDMS, RVT

## 2016-09-17 NOTE — Progress Notes (Signed)
Subjective:  Patient ID: [name], male    DOB: 1966-01-30  Age: 51 y.o. MRN: [id number]  CC: Congestive Heart Failure; medication refills; and Leg Pain (left leg, swollen, painful and purple in color)   HPI [name] a 51 year old with a history of COPD, hypercoagulable disorder, depression and anxiety, homelessness chronic left lower extremity DVT, previous history of PE (status post IVC filter, noncompliant with anticoagulation )who presents today to establish care in the clinic.  He was released from jail 10 days ago and has been out of all his medications ever since. He informs me that while he was incarcerated he received all his medications and was on Coumadin for anticoagulation.  He complains of a one-week history of left lower extremity swelling, pain and erythema but denies fever. He denies shortness of breath or chest pains. He is unable to obtain his medication due to financial constraints.  Past Medical History:  Diagnosis Date  . Collagen vascular disease ([name])   . COPD (chronic obstructive pulmonary disease) ([name])   . Coronary artery disease   . Degenerative joint disease   . Degenerative joint disease of spine   . Depression   . DVT (deep venous thrombosis) ([name])   . ETOH abuse   . Hepatitis B   . Lupus anticoagulant disorder ([name]) 02/02/2012  . Pulmonary embolism ([name]) 06/2010    Past Surgical History:  Procedure Laterality Date  . LEFT ELBOW SURGERY    . TONSILLECTOMY      No Known Allergies   Outpatient Medications Prior to Visit  Medication Sig Dispense Refill  . Ca Carbonate-Mag Hydroxide (ROLAIDS) 550-110 MG CHEW Chew 2 each by mouth daily as needed (indigestion).    Marland Kitchen diclofenac sodium (VOLTAREN) 1 % GEL Apply 2 g topically 4 (four) times daily. For arthritic pain (Patient not taking: Reported on [id number]) 123XX123 g 0  . folic acid (FOLVITE) 1 MG tablet Take 1 tablet (1 mg total) by mouth daily. For low folate (Patient not taking: Reported on  09/17/2016) 30 tablet 0  . thiamine 100 MG tablet Take 1 tablet (100 mg total) by mouth daily. For low Thiamine (Patient not taking: Reported on 09/17/2016) 30 tablet 0  . albuterol (PROVENTIL HFA;VENTOLIN HFA) 108 (90 Base) MCG/ACT inhaler Inhale 2 puffs into the lungs every 6 (six) hours as needed for wheezing or shortness of breath. (Patient not taking: Reported on 09/17/2016) 1 Inhaler 2  . carvedilol (COREG) 6.25 MG tablet Take 1 tablet (6.25 mg total) by mouth 2 (two) times daily with a meal. For high blood pressure (Patient not taking: Reported on 09/17/2016) 60 tablet 0  . citalopram (CELEXA) 40 MG tablet Take 1 tablet (40 mg total) by mouth daily. (Patient not taking: Reported on 09/17/2016) 30 tablet 0  . HYDROcodone-acetaminophen (NORCO/VICODIN) 5-325 MG tablet Take 1 tablet by mouth every 4 (four) hours as needed. (Patient not taking: Reported on 09/17/2016) 10 tablet 0  . hydrOXYzine (ATARAX/VISTARIL) 25 MG tablet Take 25 mg by mouth 3 (three) times daily as needed for anxiety.    . predniSONE (DELTASONE) 10 MG tablet 3 tablets (30 mg) daily for 2 days, then, 2 tablets (20 mg) daily for 2 days, then, 1 tablets (10 mg) daily for 1 days, then stop (Patient not taking: Reported on 09/17/2016) 9 tablet 0  . rivaroxaban (XARELTO) 20 MG TABS tablet Take 1 tablet (20 mg total) by mouth daily with breakfast. (Patient not taking: Reported on 09/17/2016) 90 tablet 3  . tiotropium (  SPIRIVA HANDIHALER) 18 MCG inhalation capsule Place 1 capsule (18 mcg total) into inhaler and inhale daily. (Patient not taking: Reported on 09/17/2016) 30 capsule 0  . traZODone (DESYREL) 50 MG tablet Take 1 tablet (50 mg total) by mouth at bedtime. For sleep (Patient not taking: Reported on 09/17/2016) 30 tablet 0   No facility-administered medications prior to visit.     ROS Review of Systems  Constitutional: Negative for activity change and appetite change.  HENT: Negative for sinus pressure and sore throat.     Respiratory: Negative for chest tightness, shortness of breath and wheezing.   Cardiovascular: Negative for chest pain and palpitations.  Gastrointestinal: Negative for abdominal distention, abdominal pain and constipation.  Genitourinary: Negative.   Musculoskeletal:       See history of present illness  Psychiatric/Behavioral: Negative for behavioral problems and dysphoric mood.    Objective:  BP 108/66 (BP Location: Right Arm, Patient Position: Sitting, Cuff Size: Small)   Pulse (!) 101   Temp 97.8 F (36.6 C) (Oral)   Ht 6\' 2"  (1.88 m)   Wt 228 lb 9.6 oz (103.7 kg)   SpO2 100%   BMI 29.35 kg/m   BP/Weight 09/17/2016 06/24/2016 [id number]  Systolic BP 123XX123 [id number]  Diastolic BP 66 99 85  Wt. (Lbs) 228.6 217 210.3  BMI 29.35 27.86 -  Some encounter information is confidential and restricted. Go to Review Flowsheets activity to see all data.      Physical Exam  Constitutional: He is oriented to person, place, and time. He appears well-developed and well-nourished.  Cardiovascular: Normal rate, normal heart sounds and intact distal pulses.   No murmur heard. Pulmonary/Chest: Effort normal and breath sounds normal. He has no wheezes. He has no rales. He exhibits no tenderness.  Abdominal: Soft. Bowel sounds are normal. He exhibits no distension and no mass. There is no tenderness.  Musculoskeletal:  Nonpitting edema of left leg to the knee with associated erythema of shin and tenderness on palpation Erythematous patches endorse some muscle. Right leg is normal.  Neurological: He is alert and oriented to person, place, and time.     Assessment & Plan:   1. COPD exacerbation (HCC) No acute exacerbation We'll consider ICS/LABA at next visit - albuterol (PROVENTIL HFA;VENTOLIN HFA) 108 (90 Base) MCG/ACT inhaler; Inhale 2 puffs into the lungs every 6 (six) hours as needed for wheezing or shortness of breath.  Dispense: 1 Inhaler; Refill: 2 - tiotropium (SPIRIVA HANDIHALER)  18 MCG inhalation capsule; Place 1 capsule (18 mcg total) into inhaler and inhale daily.  Dispense: 30 capsule; Refill: 3  2. Alcohol dependence with alcohol-induced mood disorder ([place]) Discussed cessation  3. 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  Dispense: 30 tablet; Refill: 3  4. Lupus anticoagulant disorder ([place]) Will need to begin anticoagulation for life - rivaroxaban (XARELTO) 20 MG TABS tablet; Take 1 tablet (20 mg total) by mouth daily with breakfast.  Dispense: 30 tablet; Refill: 3  5. Left leg swelling Will treat presumptively for cellulitis - VAS [place] LOWER EXTREMITY VENOUS (DVT); Future - cephALEXin (KEFLEX) 500 MG capsule; Take 1 capsule (500 mg total) by mouth  2 (two) times daily.  Dispense: 20 capsule; Refill: 0 - furosemide (LASIX) 20 MG tablet; Take 1 tablet (20 mg total) by mouth daily.  Dispense: 30 tablet; Refill: 3  6. Severe episode of recurrent major depressive disorder, without psychotic features ([place]) - citalopram (CELEXA) 40 MG tablet; Take 1 tablet (40 mg total) by mouth daily.  Dispense: 30 tablet; Refill: 3 - hydrOXYzine (ATARAX/VISTARIL) 25 MG tablet; Take 1 tablet (25 mg total) by mouth 3 (three) times daily as needed for anxiety.  Dispense: 60 tablet; Refill: 3   Meds ordered this encounter  Medications  . cephALEXin (KEFLEX) 500 MG capsule    Sig: Take 1 capsule (500 mg total) by mouth 2 (two) times daily.    Dispense:  20 capsule    Refill:  0  . furosemide (LASIX) 20 MG tablet    Sig: Take 1 tablet (20 mg total) by mouth daily.    Dispense:  30 tablet    Refill:  3  . albuterol (PROVENTIL  HFA;VENTOLIN HFA) 108 (90 Base) MCG/ACT inhaler    Sig: Inhale 2 puffs into the lungs every 6 (six) hours as needed for wheezing or shortness of breath.    Dispense:  1 Inhaler    Refill:  2  . carvedilol (COREG) 3.125 MG tablet    Sig: Take 1 tablet (3.125 mg total) by mouth 2 (two) times daily with a meal. For high blood pressure    Dispense:  60 tablet    Refill:  3  . citalopram (CELEXA) 40 MG tablet    Sig: Take 1 tablet (40 mg total) by mouth daily.    Dispense:  30 tablet    Refill:  3  . tiotropium (SPIRIVA HANDIHALER) 18 MCG inhalation capsule    Sig: Place 1 capsule (18 mcg total) into inhaler and inhale daily.    Dispense:  30 capsule    Refill:  3  . rivaroxaban (XARELTO) 20 MG TABS tablet    Sig: Take 1 tablet (20 mg total) by mouth daily with breakfast.    Dispense:  30 tablet    Refill:  3  . hydrOXYzine (ATARAX/VISTARIL) 25 MG tablet    Sig: Take 1 tablet (25 mg total) by mouth 3 (three) times daily as needed for anxiety.    Dispense:  60 tablet    Refill:  3    Follow-up: Follow-up on chronic medical conditions in one month   Arnoldo Morale MD

## 2016-09-17 NOTE — Progress Notes (Signed)
Got out of jail 7-10 days ago Hasn't taken meds since he's been out Homeless right now

## 2016-09-18 ENCOUNTER — Telehealth: Payer: Self-pay | Admitting: *Deleted

## 2016-09-18 NOTE — Telephone Encounter (Signed)
Noted  

## 2016-09-18 NOTE — Telephone Encounter (Signed)
Per [facility]: No new findings, chronic DVT in leg.  States the report is available to be read by physician.

## 2016-09-25 ENCOUNTER — Telehealth: Payer: Self-pay

## 2016-09-25 NOTE — Telephone Encounter (Signed)
-----   Message from Arnoldo Morale, MD sent at 09/18/2016 12:12 PM EST ----- Chronic left lower extremity DVT which has regressed compared to previous study. Please emphasize compliance with the Xarelto.

## 2016-09-25 NOTE — Telephone Encounter (Signed)
Writer attempted to call patient's through several numbers and was unsuccessful.  Writer LVM with the [facility] nurse at [facility], [employer] .

## 2016-09-29 ENCOUNTER — Encounter (HOSPITAL_COMMUNITY): Payer: Self-pay

## 2016-09-29 ENCOUNTER — Emergency Department (HOSPITAL_COMMUNITY): Payer: Self-pay

## 2016-09-29 ENCOUNTER — Emergency Department (HOSPITAL_COMMUNITY)
Admission: EM | Admit: 2016-09-29 | Discharge: 2016-09-30 | Disposition: A | Discharge status: Home / Self Care | Payer: Self-pay | Attending: Emergency Medicine | Admitting: Emergency Medicine

## 2016-09-29 DIAGNOSIS — I2510 Atherosclerotic heart disease of native coronary artery without angina pectoris: Secondary | ICD-10-CM | POA: Insufficient documentation

## 2016-09-29 DIAGNOSIS — F17210 Nicotine dependence, cigarettes, uncomplicated: Secondary | ICD-10-CM | POA: Insufficient documentation

## 2016-09-29 DIAGNOSIS — J441 Chronic obstructive pulmonary disease with (acute) exacerbation: Secondary | ICD-10-CM | POA: Insufficient documentation

## 2016-09-29 DIAGNOSIS — Z79899 Other long term (current) drug therapy: Secondary | ICD-10-CM | POA: Insufficient documentation

## 2016-09-29 DIAGNOSIS — Z7901 Long term (current) use of anticoagulants: Secondary | ICD-10-CM | POA: Insufficient documentation

## 2016-09-29 LAB — BASIC METABOLIC PANEL
Anion gap: 10 (ref 5–15)
BUN: 6 mg/dL (ref 6–20)
CHLORIDE: 108 mmol/L (ref 101–111)
CO2: 22 mmol/L (ref 22–32)
CREATININE: 0.94 mg/dL (ref 0.61–1.24)
Calcium: 8.5 mg/dL — ABNORMAL LOW (ref 8.9–10.3)
GFR calc non Af Amer: >60 mL/min (ref >60)
Glucose, Bld: 147 mg/dL — ABNORMAL HIGH (ref 65–99)
Potassium: 3.7 mmol/L (ref 3.5–5.1)
Sodium: 140 mmol/L (ref 135–145)

## 2016-09-29 LAB — CBC
HEMATOCRIT: 33.3 % — AB (ref 39.0–52.0)
HEMOGLOBIN: 10.9 g/dL — AB (ref 13.0–17.0)
MCH: 32.4 pg (ref 26.0–34.0)
MCHC: 32.7 g/dL (ref 30.0–36.0)
MCV: 99.1 fL (ref 78.0–100.0)
PLATELETS: 244 10*3/uL (ref 150–400)
RBC: 3.36 MIL/uL — AB (ref 4.22–5.81)
RDW: 16.9 % — ABNORMAL HIGH (ref 11.5–15.5)
WBC: 6.8 10*3/uL (ref 4.0–10.5)

## 2016-09-29 LAB — I-STAT TROPONIN, ED: Troponin i, poc: 0 ng/mL (ref 0.00–0.08)

## 2016-09-29 MED ORDER — PREDNISONE 20 MG PO TABS
60.0000 mg | ORAL_TABLET | Freq: Once | ORAL | Status: AC
  Administered 2016-09-29: 60 mg via ORAL
  Filled 2016-09-29: qty 3

## 2016-09-29 MED ORDER — SODIUM CHLORIDE 0.9 % IV BOLUS (SEPSIS)
1000.0000 mL | Freq: Once | INTRAVENOUS | Status: AC
  Administered 2016-09-29: 1000 mL via INTRAVENOUS

## 2016-09-29 MED ORDER — PREDNISONE 20 MG PO TABS: 40.0000 mg | ORAL_TABLET | Freq: Every day | ORAL | 0 refills | Status: DC

## 2016-09-29 NOTE — ED Triage Notes (Signed)
Patient comes by EMS for shortness of breath and chest pain with that.  History of COPD and symptoms have been consistent for 3 days.  Wheezing noted in lungs.  EMS gave 5mg  of albuterol.  A&Ox4 at this time.

## 2016-09-29 NOTE — Discharge Instructions (Signed)
Please follow up with your PCP regarding COPD Take steroids for the next 4 days

## 2016-09-29 NOTE — ED Provider Notes (Signed)
[facility] DEPT Provider Note   CSN: [id number] Arrival date & time: 09/29/16  2052   History   Chief Complaint Chief Complaint  Patient presents with  . Shortness of Breath    HPI [name] is a 51 y.o. male with PMH significant for COPD, ongoing ETOH abuse, depression, Lupus, hx of DVT/PE on anticoagulation and s/p IVC filter, CAD, Hep B, tobacco use, chronic low back pain, homelessness. He states over the past 2 days he has had progressively worsening SOB. He reports some lightheadedness, cough, decreased PO intake, and wheezing. He has inhalers and has been using them but states "they don't seem to be doing no good". Has albuterol and Spiriva. He continues to smoke 1 pack per day and states he is trying to cut back. He states he drank a 6 pack of beer earlier today and hasn't been eating or drinking that much. He reports compliance with Xarelto. When asked where he stays he states "here or there". Denies fever, chills, URI symptoms, chest pain, abdominal pain. He has chronic N/V every morning. Denies diarrhea, melena/hematochezia, dysuria, syncope. Also reports leg swelling. He had a lower extremity US of his chronic DVT on 1/11 which showed DVT was improving.  HPI  Past Medical History:  Diagnosis Date  . Collagen vascular disease ([place])   . COPD (chronic obstructive pulmonary disease) ([place])   . Coronary artery disease   . Degenerative joint disease   . Degenerative joint disease of spine   . Depression   . DVT (deep venous thrombosis) ([place])   . ETOH abuse   . Hepatitis B   . Lupus anticoagulant disorder ([place]) 02/02/2012  . Pulmonary embolism ([place]) 06/2010    Patient Active Problem List   Diagnosis Date Noted  . Syncope and collapse 05/31/2016  . Low back pain 05/31/2016  . Chest pain 05/30/2016  . Left leg swelling   . COPD exacerbation ([place]) 04/22/2016  . Acute respiratory failure with hypoxia ([place]) 02/09/2016  . Alcohol dependence with withdrawal, uncomplicated  ([place]) [id number]  . Suicidal ideations 02/09/2015  . GAD (generalized anxiety disorder) 09/18/2014  . Recurrent major depression-severe ([place]) 09/18/2014  . Alcohol abuse with alcohol-induced mood disorder ([place]) 09/17/2014  . Left leg DVT ([place]) 06/06/2014  . Upper leg DVT (deep venous thromboembolism), acute ([place]) 06/12/2013  . Homeless single person 06/12/2013  . 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 Lupus anticoagulant disorder ([place]) 02/02/2012  . Degenerative joint disease   . Tobacco abuse 03/01/2011  . Pulmonary embolism ([place]) 06/07/2010    Past Surgical History:  Procedure Laterality Date  . LEFT ELBOW SURGERY    . TONSILLECTOMY       Home Medications    Prior to Admission medications   Medication Sig Start Date End Date Taking? Authorizing Provider  albuterol (PROVENTIL HFA;VENTOLIN HFA) 108 (90 Base) MCG/ACT inhaler Inhale 2 puffs into the lungs every 6 (six) hours as needed for wheezing or shortness of breath. 09/17/16   Arnoldo Morale, MD  Ca Carbonate-Mag Hydroxide (ROLAIDS) 550-110 MG CHEW Chew 2 each by mouth daily as needed (indigestion).    Historical Provider, MD  carvedilol (COREG) 3.125 MG tablet Take 1 tablet (3.125 mg total) by mouth 2 (two) times daily with a meal. For high blood pressure 09/17/16   Arnoldo Morale, MD  cephALEXin (KEFLEX) 500 MG capsule Take 1 capsule (500 mg total) by mouth 2 (two) times daily. 09/17/16   Arnoldo Morale, MD  citalopram (CELEXA) 40 MG tablet Take 1 tablet (40 mg  total) by mouth daily. 09/17/16   Arnoldo Morale, MD  diclofenac sodium (VOLTAREN) 1 % GEL Apply 2 g topically 4 (four) times daily. For arthritic pain Patient not taking: Reported on 09/17/2016 06/02/16   Jonetta Osgood, MD  folic acid (FOLVITE) 1 MG tablet Take 1 tablet (1 mg total) by mouth daily. For low folate Patient not taking: Reported on 09/17/2016 06/02/16   Jonetta Osgood, MD  furosemide (LASIX) 20 MG tablet Take 1 tablet (20 mg total) by mouth daily. 09/17/16   Arnoldo Morale, MD  hydrOXYzine (ATARAX/VISTARIL) 25 MG tablet Take 1 tablet (25 mg total) by mouth 3 (three) times daily as needed for anxiety. 09/17/16   Arnoldo Morale, MD  rivaroxaban (XARELTO) 20 MG TABS tablet Take 1 tablet (20 mg total) by mouth daily with breakfast. 09/17/16   Arnoldo Morale, MD  thiamine 100 MG tablet Take 1 tablet (100 mg total) by mouth daily. For low Thiamine Patient not taking: Reported on 09/17/2016 02/24/16   Encarnacion Slates, NP  tiotropium (SPIRIVA HANDIHALER) 18 MCG inhalation capsule Place 1 capsule (18 mcg total) into inhaler and inhale daily. 09/17/16   Arnoldo Morale, MD    Family History Family History  Problem Relation Age of Onset  . Cancer Mother     Ovarian    Social History Social History  Substance Use Topics  . Smoking status: Current Every Day Smoker    Packs/day: 2.00    Years: 30.00    Types: Cigarettes  . Smokeless tobacco: Never Used  . Alcohol use 14.4 oz/week    24 Cans of beer per week     Comment: everyday- 24 cans     Allergies   Patient has no known allergies.   Review of Systems Review of Systems  Constitutional: Positive for appetite change. Negative for chills and fever.  HENT: Negative for congestion, rhinorrhea and sore throat.   Respiratory: Positive for cough, shortness of breath and wheezing.   Cardiovascular: Positive for leg swelling (chronic ). Negative for chest pain and palpitations.  Gastrointestinal: Positive for nausea (chronic) and vomiting (chronic). Negative for abdominal pain and diarrhea.  Genitourinary: Negative for dysuria.  Neurological: Positive for light-headedness. Negative for syncope.  All other systems reviewed and are negative.    Physical Exam Updated Vital Signs BP (!) 110/48   Pulse 111   Temp 98.4 F (36.9 C) (Oral)   Resp 12   Ht 6\' 2"  (1.88 m)   Wt 95.3 kg   SpO2 94%   BMI 26.96 kg/m   Physical Exam  Constitutional: He is oriented to person, place, and time. He appears well-developed and  well-nourished. No distress.  Chronically ill, disheveled appearing. No intoxication  HENT:  Head: Normocephalic and atraumatic.  Eyes: Conjunctivae are normal. Pupils are equal, round, and reactive to light. Right eye exhibits no discharge. Left eye exhibits no discharge. No scleral icterus.  Neck: Normal range of motion.  Cardiovascular: Intact distal pulses.  Tachycardia present.  Exam reveals no gallop and no friction rub.   No murmur heard. Pulmonary/Chest: Effort normal and breath sounds normal. No respiratory distress. He has no wheezes. He has no rales. He exhibits no tenderness.  Abdominal: Soft. Bowel sounds are normal. He exhibits no distension and no mass. There is no tenderness. There is no rebound and no guarding. No hernia.  Neurological: He is alert and oriented to person, place, and time.  Skin: Skin is warm and dry.  Psychiatric: He has a  normal mood and affect. His behavior is normal.  Nursing note and vitals reviewed.    ED Treatments / Results  Labs (all labs ordered are listed, but only abnormal results are displayed) Labs Reviewed  BASIC METABOLIC PANEL - Abnormal; Notable for the following:       Result Value   Glucose, Bld 147 (*)    Calcium 8.5 (*)    All other components within normal limits  CBC - Abnormal; Notable for the following:    RBC 3.36 (*)    Hemoglobin 10.9 (*)    HCT 33.3 (*)    RDW 16.9 (*)    All other components within normal limits  I-STAT TROPOININ, ED    EKG  EKG Interpretation None       Radiology Dg Chest 2 View  Result Date: 09/29/2016 CLINICAL DATA:  Initial evaluation for acute chest pain with shortness of breath. History of COPD. EXAM: CHEST  2 VIEW COMPARISON:  Prior radiograph from 02/24/2016. FINDINGS: Cardiac and mediastinal silhouettes are stable in size and contour, and remain within normal limits. Lungs are hyperinflated with changes related COPD present. No focal infiltrates identified. No pulmonary edema or  pleural effusion. No pneumothorax. Calcified granuloma noted within the posterior left mid lung, stable. No acute osseous abnormality. IMPRESSION: Hyperinflation with changes related to COPD. No other active cardiopulmonary disease identified. Electronically Signed   By: Jeannine Boga M.D.   On: 09/29/2016 21:40    Procedures Procedures (including critical care time)  Medications Ordered in ED Medications  sodium chloride 0.9 % bolus 1,000 mL (0 mLs Intravenous Stopped 09/29/16 2306)  sodium chloride 0.9 % bolus 1,000 mL (0 mLs Intravenous Stopped 09/29/16 2340)  predniSONE (DELTASONE) tablet 60 mg (60 mg Oral Given 09/29/16 2339)     Initial Impression / Assessment and Plan / ED Course  I have reviewed the triage vital signs and the nursing notes.  Pertinent labs & imaging results that were available during my care of the patient were reviewed by me and considered in my medical decision making (see chart for details).  51 year old male presents with symptoms consistent with mild COPD exacerbation. EMS reported wheezing on exam. On arrival to the ED lungs were CTA and he was not hypoxic or in distress. CBC remarkable for worsening anemia. Pt is on blood thinner. Denies melena/hematochezia. BMP overall unremarkable. Troponin is 0. CXR negative. EKG is sinus tachycardia. Pt appears dry and reports decreased PO intake and has ongoing alcohol abuse. 2L IVF given in ED.  On recheck, lungs are still CTA. Pt is sleeping comfortably. Tachycardia is resolved. Dose of prednisone given in ED. Pt refused ambulatory O2 sats. He remains to be not hypoxic. Will d/c with steroid burst and recommend PCP follow up. Patient is NAD, non-toxic, with stable VS. Patient is informed of clinical course, understands medical decision making process, and agrees with plan. Opportunity for questions provided and all questions answered. Return precautions given.   Final Clinical Impressions(s) / ED Diagnoses   Final  diagnoses:  COPD exacerbation ([place])    New Prescriptions Discharge Medication List as of 09/29/2016 11:45 PM    START taking these medications   Details  predniSONE (DELTASONE) 20 MG tablet Take 2 tablets (40 mg total) by mouth daily., Starting Tue 09/29/2016, Print         Recardo Evangelist, PA-C 09/30/16 [id number]    Milton Ferguson, MD 10/01/16 [phone]

## 2016-09-30 NOTE — ED Notes (Signed)
Patient Alert and oriented X4. Stable and ambulatory. Patient verbalized understanding of the discharge instructions.  Patient belongings were taken by the patient.  

## 2016-11-06 ENCOUNTER — Ambulatory Visit: Payer: Self-pay | Attending: Family Medicine | Admitting: Family Medicine

## 2016-11-06 ENCOUNTER — Telehealth: Payer: Self-pay

## 2016-11-06 ENCOUNTER — Encounter: Payer: Self-pay | Admitting: Family Medicine

## 2016-11-06 VITALS — BP 102/63 | HR 98 | Temp 98.7°F | Resp 18 | Ht 74.0 in | Wt 213.0 lb

## 2016-11-06 DIAGNOSIS — D6862 Lupus anticoagulant syndrome: Secondary | ICD-10-CM | POA: Insufficient documentation

## 2016-11-06 DIAGNOSIS — Z5900 Homelessness unspecified: Secondary | ICD-10-CM

## 2016-11-06 DIAGNOSIS — I82402 Acute embolism and thrombosis of unspecified deep veins of left lower extremity: Secondary | ICD-10-CM

## 2016-11-06 DIAGNOSIS — J441 Chronic obstructive pulmonary disease with (acute) exacerbation: Secondary | ICD-10-CM | POA: Insufficient documentation

## 2016-11-06 DIAGNOSIS — I825Z2 Chronic embolism and thrombosis of unspecified deep veins of left distal lower extremity: Secondary | ICD-10-CM | POA: Insufficient documentation

## 2016-11-06 DIAGNOSIS — M7989 Other specified soft tissue disorders: Secondary | ICD-10-CM | POA: Insufficient documentation

## 2016-11-06 DIAGNOSIS — J449 Chronic obstructive pulmonary disease, unspecified: Secondary | ICD-10-CM

## 2016-11-06 DIAGNOSIS — I2510 Atherosclerotic heart disease of native coronary artery without angina pectoris: Secondary | ICD-10-CM | POA: Insufficient documentation

## 2016-11-06 DIAGNOSIS — Z9119 Patient's noncompliance with other medical treatment and regimen: Secondary | ICD-10-CM | POA: Insufficient documentation

## 2016-11-06 DIAGNOSIS — F1094 Alcohol use, unspecified with alcohol-induced mood disorder: Secondary | ICD-10-CM | POA: Insufficient documentation

## 2016-11-06 DIAGNOSIS — F332 Major depressive disorder, recurrent severe without psychotic features: Secondary | ICD-10-CM | POA: Insufficient documentation

## 2016-11-06 DIAGNOSIS — F419 Anxiety disorder, unspecified: Secondary | ICD-10-CM | POA: Insufficient documentation

## 2016-11-06 DIAGNOSIS — M479 Spondylosis, unspecified: Secondary | ICD-10-CM | POA: Insufficient documentation

## 2016-11-06 DIAGNOSIS — Z9114 Patient's other noncompliance with medication regimen: Secondary | ICD-10-CM | POA: Insufficient documentation

## 2016-11-06 DIAGNOSIS — Z86711 Personal history of pulmonary embolism: Secondary | ICD-10-CM | POA: Insufficient documentation

## 2016-11-06 DIAGNOSIS — Z7901 Long term (current) use of anticoagulants: Secondary | ICD-10-CM | POA: Insufficient documentation

## 2016-11-06 MED ORDER — MOMETASONE FURO-FORMOTEROL FUM 100-5 MCG/ACT IN AERO: 2.0000 | INHALATION_SPRAY | Freq: Two times a day (BID) | RESPIRATORY_TRACT | 3 refills | Status: DC

## 2016-11-06 MED FILL — CARVEDILOL 3.125 MG TABLET: 3.125 | 30 days supply | Qty: 60 | Fill #1

## 2016-11-06 MED FILL — XARELTO 20 MG TABLET: 20 | 30 days supply | Qty: 30 | Fill #1

## 2016-11-06 MED FILL — **DULERA 100 MCG/5 MCG INH: 100-5 | 15 days supply | Qty: 1 | Fill #0

## 2016-11-06 MED FILL — SPIRIVA 18 MCG CP-HANDIHALE: 18 | 30 days supply | Qty: 30 | Fill #1

## 2016-11-06 MED FILL — CITALOPRAM HBR 40 MG TABLET: 40 | 30 days supply | Qty: 30 | Fill #1

## 2016-11-06 MED FILL — VENTOLIN HFA 90 MCG INHALER: 108 (90 BAS | 20 days supply | Qty: 18 | Fill #1

## 2016-11-06 MED FILL — ?FUROSEMIDE 20 MG TABLET: 20 | 30 days supply | Qty: 30 | Fill #1

## 2016-11-06 MED FILL — hydrOXYzine HCL 25 MG TABS: 25 | 20 days supply | Qty: 60 | Fill #1

## 2016-11-06 NOTE — Progress Notes (Signed)
Patient is here for FU  Patient denies pain at this time  Patient has taken medication today. Patient has not eaten today.

## 2016-11-06 NOTE — Telephone Encounter (Signed)
Met with the patient at the request of Dr Jarold Song. The patient had not picked up his medications after his last appointment. As per Blanchie Dessert, [facility], the medications will not be ready until 1600-1630 today.   This CM spoke to the patient and he stated that he is not able to wait for the medications until 1600-1630. This CM asked him if it was ok for this CM to call Ella Bodo, his [facility] caseworker and ask Cristie Hem if he is able to pick up the medications and the patient gave approval. The patient then asked if Cristie Hem can pick him up at the clinic now. The patient stated that Cristie Hem knows where is stays as he is homeless.   This CM spoke to [facility], [place] # [phone] and informed him that the patient has medications that need to be picked up at [facility] and he is not able to stay until 1600-1630 when the medications will be ready. [name] stated that he will pick them up and bring them to the patient. He stated that he has the patient's birth day.  He asked that the patient be given a bus pass as he is not able to pick him up at the clinic.   This CM informed the patient that Cristie Hem will pick up his medications for him today but will not be able to pick him up at the clinic now. The clinic provided the patient with a bus pass.   Update provided to Dr Jarold Song

## 2016-11-06 NOTE — Progress Notes (Signed)
Subjective:    Patient ID: [name], male    DOB: 10/27/1965, 51 y.o.   MRN: [id number]  HPI [name] a 51 year old with a history of COPD, hypercoagulable disorder, depression and anxiety, homelessness chronic left lower extremity DVT, previous history of PE (status post IVC filter, noncompliant with anticoagulation )who presents today For a follow-up visit.  He has chronic left lower extremity swelling, pain and erythema but denies fever. He denies shortness of breath or chest pains. He is unable to obtain his medication due to financial constraints.  Since his last visit 6 weeks ago he has been to the ED for COPD exacerbation and on further questioning he is still yet to pick up his medications due to financial constraints. He has persisting cough and shortness of breath. Cough is nonproductive.  Past Medical History:  Diagnosis Date  . Collagen vascular disease ([place])   . COPD (chronic obstructive pulmonary disease) ([place])   . Coronary artery disease   . Degenerative joint disease   . Degenerative joint disease of spine   . Depression   . DVT (deep venous thrombosis) ([place])   . ETOH abuse   . Hepatitis B   . Lupus anticoagulant disorder ([place]) 02/02/2012  . Pulmonary embolism ([place]) 06/2010    Past Surgical History:  Procedure Laterality Date  . LEFT ELBOW SURGERY    . TONSILLECTOMY      No Known Allergies  Current Outpatient Prescriptions on File Prior to Visit  Medication Sig Dispense Refill  . albuterol (PROVENTIL HFA;VENTOLIN HFA) 108 (90 Base) MCG/ACT inhaler Inhale 2 puffs into the lungs every 6 (six) hours as needed for wheezing or shortness of breath. 1 Inhaler 2  . Ca Carbonate-Mag Hydroxide (ROLAIDS) 550-110 MG CHEW Chew 2 each by mouth daily as needed (indigestion).    . carvedilol (COREG) 3.125 MG tablet Take 1 tablet (3.125 mg total) by mouth 2 (two) times daily with a meal. For high blood pressure 60 tablet 3  . cephALEXin (KEFLEX) 500 MG capsule Take 1  capsule (500 mg total) by mouth 2 (two) times daily. 20 capsule 0  . citalopram (CELEXA) 40 MG tablet Take 1 tablet (40 mg total) by mouth daily. 30 tablet 3  . furosemide (LASIX) 20 MG tablet Take 1 tablet (20 mg total) by mouth daily. 30 tablet 3  . hydrOXYzine (ATARAX/VISTARIL) 25 MG tablet Take 1 tablet (25 mg total) by mouth 3 (three) times daily as needed for anxiety. 60 tablet 3  . predniSONE (DELTASONE) 20 MG tablet Take 2 tablets (40 mg total) by mouth daily. 8 tablet 0  . rivaroxaban (XARELTO) 20 MG TABS tablet Take 1 tablet (20 mg total) by mouth daily with breakfast. 30 tablet 3  . tiotropium (SPIRIVA HANDIHALER) 18 MCG inhalation capsule Place 1 capsule (18 mcg total) into inhaler and inhale daily. 30 capsule 3   No current facility-administered medications on file prior to visit.      Review of Systems  Constitutional: Negative for activity change and appetite change.  HENT: Negative for sinus pressure and sore throat.   Eyes: Negative for visual disturbance.  Respiratory: Positive for cough and shortness of breath. Negative for chest tightness.   Cardiovascular: Positive for leg swelling. Negative for chest pain.  Gastrointestinal: Negative for abdominal distention, abdominal pain, constipation and diarrhea.  Endocrine: Negative.   Genitourinary: Negative for dysuria.  Musculoskeletal: Negative for arthralgias.  Skin: Negative for rash.  Allergic/Immunologic: Negative.   Neurological: Negative for weakness, light-headedness and  numbness.  Psychiatric/Behavioral: Negative for dysphoric mood and suicidal ideas.       Objective: Vitals:   11/06/16 1201  BP: 102/63  Pulse: 98  Resp: 18  Temp: 98.7 F (37.1 C)  TempSrc: Oral  SpO2: 91%  Weight: 213 lb (96.6 kg)  Height: 6\' 2"  (1.88 m)      Physical Exam Constitutional: He is oriented to person, place, and time. He appears well-developed and well-nourished.  Cardiovascular: Normal rate, normal heart sounds and  intact distal pulses.   No murmur heard. Pulmonary/Chest: Effort normal and breath sounds normal. Intermittent wheezing in bilateral lung fields Abdominal: Soft. Bowel sounds are normal. He exhibits no distension and no mass. There is no tenderness.  Musculoskeletal:  Nonpitting edema of left leg to the knee with associated erythema of shin, stasis dermatitis  Right leg is normal.  Neurological: He is alert and oriented to person, place, and time.  Psych: Normal      Assessment & Plan:  1. COPD exacerbation (HCC) No acute exacerbation Commenced on Dulera - albuterol (PROVENTIL HFA;VENTOLIN HFA) 108 (90 Base) MCG/ACT inhaler; Inhale 2 puffs into the lungs every 6 (six) hours as needed for wheezing or shortness of breath.  Dispense: 1 Inhaler; Refill: 2 - tiotropium (SPIRIVA HANDIHALER) 18 MCG inhalation capsule; Place 1 capsule (18 mcg total) into inhaler and inhale daily.  Dispense: 30 capsule; Refill: 3  2. Alcohol dependence with alcohol-induced mood disorder ([place]) Discussed cessation  3. 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  Dispense: 30 tablet; Refill: 3 - furosemide (LASIX) 20 MG tablet; Take 1 tablet (20 mg total) by mouth daily.  Dispense: 30 tablet; Refill: 3   4. Lupus anticoagulant disorder (HCC) Will need to be on anticoagulation for life - rivaroxaban (XARELTO) 20 MG TABS tablet; Take 1 tablet (20 mg total) by mouth daily with breakfast.  Dispense: 30 tablet; Refill: 3  5. Severe episode of recurrent major depressive disorder, without psychotic features ([place]) -  citalopram (CELEXA) 40 MG tablet; Take 1 tablet (40 mg total) by mouth daily.  Dispense: 30 tablet; Refill: 3 - hydrOXYzine (ATARAX/VISTARIL) 25 MG tablet; Take 1 tablet (25 mg total) by mouth 3 (three) times daily as needed for anxiety.  Dispense: 60 tablet; Refill: 3  Case manager spoke with the patient and we have provided transportation for him. He will have his caseworker from diuretics take up to pick up his medications for him.

## 2016-12-14 ENCOUNTER — Telehealth: Payer: Self-pay

## 2016-12-14 NOTE — Telephone Encounter (Signed)
Called to find out if pt would like to have colonoscopy scheduled 

## 2016-12-21 ENCOUNTER — Encounter (HOSPITAL_COMMUNITY): Payer: Self-pay | Admitting: Emergency Medicine

## 2016-12-21 ENCOUNTER — Ambulatory Visit: Payer: Self-pay | Attending: Family Medicine | Admitting: Family Medicine

## 2016-12-21 ENCOUNTER — Encounter: Payer: Self-pay | Admitting: Family Medicine

## 2016-12-21 ENCOUNTER — Emergency Department (HOSPITAL_COMMUNITY): Payer: Self-pay

## 2016-12-21 ENCOUNTER — Emergency Department (HOSPITAL_COMMUNITY)
Admission: EM | Admit: 2016-12-21 | Discharge: 2016-12-23 | Disposition: A | Discharge status: Other Acute Inpatient Hospital | Payer: Self-pay | Attending: Emergency Medicine | Admitting: Emergency Medicine

## 2016-12-21 VITALS — BP 115/76 | HR 105 | Temp 97.9°F | Ht 74.0 in | Wt 217.4 lb

## 2016-12-21 DIAGNOSIS — Z79899 Other long term (current) drug therapy: Secondary | ICD-10-CM | POA: Insufficient documentation

## 2016-12-21 DIAGNOSIS — F332 Major depressive disorder, recurrent severe without psychotic features: Secondary | ICD-10-CM | POA: Diagnosis present

## 2016-12-21 DIAGNOSIS — J449 Chronic obstructive pulmonary disease, unspecified: Secondary | ICD-10-CM | POA: Insufficient documentation

## 2016-12-21 DIAGNOSIS — F1014 Alcohol abuse with alcohol-induced mood disorder: Secondary | ICD-10-CM | POA: Insufficient documentation

## 2016-12-21 DIAGNOSIS — Z86718 Personal history of other venous thrombosis and embolism: Secondary | ICD-10-CM | POA: Insufficient documentation

## 2016-12-21 DIAGNOSIS — D6862 Lupus anticoagulant syndrome: Secondary | ICD-10-CM | POA: Insufficient documentation

## 2016-12-21 DIAGNOSIS — I2510 Atherosclerotic heart disease of native coronary artery without angina pectoris: Secondary | ICD-10-CM | POA: Insufficient documentation

## 2016-12-21 DIAGNOSIS — Z7982 Long term (current) use of aspirin: Secondary | ICD-10-CM | POA: Insufficient documentation

## 2016-12-21 DIAGNOSIS — F17210 Nicotine dependence, cigarettes, uncomplicated: Secondary | ICD-10-CM | POA: Insufficient documentation

## 2016-12-21 DIAGNOSIS — R45851 Suicidal ideations: Secondary | ICD-10-CM

## 2016-12-21 DIAGNOSIS — F319 Bipolar disorder, unspecified: Secondary | ICD-10-CM | POA: Insufficient documentation

## 2016-12-21 DIAGNOSIS — Z86711 Personal history of pulmonary embolism: Secondary | ICD-10-CM | POA: Insufficient documentation

## 2016-12-21 DIAGNOSIS — F1010 Alcohol abuse, uncomplicated: Secondary | ICD-10-CM | POA: Insufficient documentation

## 2016-12-21 DIAGNOSIS — I2699 Other pulmonary embolism without acute cor pulmonale: Secondary | ICD-10-CM

## 2016-12-21 DIAGNOSIS — Z9119 Patient's noncompliance with other medical treatment and regimen: Secondary | ICD-10-CM | POA: Insufficient documentation

## 2016-12-21 DIAGNOSIS — Z590 Homelessness: Secondary | ICD-10-CM | POA: Insufficient documentation

## 2016-12-21 DIAGNOSIS — Z7902 Long term (current) use of antithrombotics/antiplatelets: Secondary | ICD-10-CM | POA: Insufficient documentation

## 2016-12-21 DIAGNOSIS — Z7901 Long term (current) use of anticoagulants: Secondary | ICD-10-CM | POA: Insufficient documentation

## 2016-12-21 LAB — COMPREHENSIVE METABOLIC PANEL
ALK PHOS: 87 U/L (ref 38–126)
ALT: 87 U/L — AB (ref 17–63)
AST: 140 U/L — AB (ref 15–41)
Albumin: 3.8 g/dL (ref 3.5–5.0)
Anion gap: 12 (ref 5–15)
CALCIUM: 8.5 mg/dL — AB (ref 8.9–10.3)
CO2: 22 mmol/L (ref 22–32)
CREATININE: 0.62 mg/dL (ref 0.61–1.24)
Chloride: 103 mmol/L (ref 101–111)
Glucose, Bld: 82 mg/dL (ref 65–99)
Potassium: 4.1 mmol/L (ref 3.5–5.1)
Sodium: 137 mmol/L (ref 135–145)
Total Bilirubin: 0.7 mg/dL (ref 0.3–1.2)
Total Protein: 7.5 g/dL (ref 6.5–8.1)

## 2016-12-21 LAB — CBC
HCT: 44.3 % (ref 39.0–52.0)
Hemoglobin: 14.8 g/dL (ref 13.0–17.0)
MCH: 32.6 pg (ref 26.0–34.0)
MCHC: 33.4 g/dL (ref 30.0–36.0)
MCV: 97.6 fL (ref 78.0–100.0)
PLATELETS: 106 10*3/uL — AB (ref 150–400)
RBC: 4.54 MIL/uL (ref 4.22–5.81)
RDW: 13.7 % (ref 11.5–15.5)
WBC: 4.9 10*3/uL (ref 4.0–10.5)

## 2016-12-21 LAB — RAPID URINE DRUG SCREEN, HOSP PERFORMED
Amphetamines: NOT DETECTED
Barbiturates: NOT DETECTED
Benzodiazepines: NOT DETECTED
Cocaine: NOT DETECTED
OPIATES: NOT DETECTED
Tetrahydrocannabinol: NOT DETECTED

## 2016-12-21 LAB — SALICYLATE LEVEL

## 2016-12-21 LAB — ETHANOL: ALCOHOL ETHYL (B): 204 mg/dL — AB (ref <5)

## 2016-12-21 LAB — ACETAMINOPHEN LEVEL: Acetaminophen (Tylenol), Serum: <10 ug/mL — ABNORMAL LOW (ref 10–30)

## 2016-12-21 MED ORDER — THIAMINE HCL 100 MG/ML IJ SOLN: 100.0000 mg | Freq: Every day | INTRAMUSCULAR | Status: DC

## 2016-12-21 MED ORDER — LORAZEPAM 2 MG/ML IJ SOLN
0.0000 mg | Freq: Two times a day (BID) | INTRAMUSCULAR | Status: DC
Start: 2016-12-23 — End: 2016-12-22

## 2016-12-21 MED ORDER — LORAZEPAM 1 MG PO TABS
0.0000 mg | ORAL_TABLET | Freq: Four times a day (QID) | ORAL | Status: AC
  Administered 2016-12-21: 2 mg via ORAL
  Administered 2016-12-22 (×2): 1 mg via ORAL
  Administered 2016-12-22 – 2016-12-23 (×4): 2 mg via ORAL
  Administered 2016-12-23: 1 mg via ORAL
  Filled 2016-12-21 (×3): qty 2
  Filled 2016-12-21: qty 1
  Filled 2016-12-21: qty 2
  Filled 2016-12-21 (×2): qty 1
  Filled 2016-12-21: qty 2

## 2016-12-21 MED ORDER — IOPAMIDOL (ISOVUE-370) INJECTION 76%
100.0000 mL | Freq: Once | INTRAVENOUS | Status: AC | PRN
  Administered 2016-12-21: 100 mL via INTRAVENOUS

## 2016-12-21 MED ORDER — IOPAMIDOL (ISOVUE-370) INJECTION 76%
INTRAVENOUS | Status: AC
  Administered 2016-12-21: 17:00:00
  Filled 2016-12-21: qty 100

## 2016-12-21 MED ORDER — VITAMIN B-1 100 MG PO TABS
100.0000 mg | ORAL_TABLET | Freq: Every day | ORAL | Status: DC
  Administered 2016-12-21 (×2): 100 mg via ORAL
  Filled 2016-12-21 (×3): qty 1

## 2016-12-21 MED ORDER — LORAZEPAM 1 MG PO TABS: 0.0000 mg | ORAL_TABLET | Freq: Two times a day (BID) | ORAL | Status: DC

## 2016-12-21 MED ORDER — VITAMIN B-1 100 MG PO TABS
100.0000 mg | ORAL_TABLET | Freq: Every day | ORAL | Status: DC
  Administered 2016-12-22 – 2016-12-23 (×2): 100 mg via ORAL
  Filled 2016-12-21: qty 1

## 2016-12-21 MED ORDER — LORAZEPAM 2 MG/ML IJ SOLN: 0.0000 mg | Freq: Four times a day (QID) | INTRAMUSCULAR | Status: DC

## 2016-12-21 NOTE — Progress Notes (Signed)
Subjective:  Patient ID: [name], male    DOB: 02-22-1966  Age: 51 y.o. MRN: [id number]  CC: Follow-up (CHRONIC CONDITIONS )   HPI [name] is a 51 year old with a history of COPD, hypercoagulable disorder, depression and anxiety, homelessness chronic left lower extremity DVT, previous history of PE (status post IVC filter, noncompliant with anticoagulation )who presents today For a follow-up visit.  He has chronic left lower extremity swelling, pain and erythema but denies fever. He denies shortness of breath or chest pains. He has a chronic cough and endorses noncompliance with his inhalers because he does not feel like taking them.  He endorses suicidal ideations and suicidal attempts - attempted to slit his wrist last night but was unable to due to the razor blade being stuck. He states he has no reason to live. His feelings are exacerbated by his homelessness, inability to afford his basic necessities. He admits to me he has bipolar depression and on further questioning he was referred to [facility] but never went because "it is a waste of time" and due to inability to afford transportation and bus tickets. He states he will make another attempt at suicide; the razor blade is in his jacket he says.  Past Medical History:  Diagnosis Date  . Collagen vascular disease ([place])   . COPD (chronic obstructive pulmonary disease) ([place])   . Coronary artery disease   . Degenerative joint disease   . Degenerative joint disease of spine   . Depression   . DVT (deep venous thrombosis) ([place])   . ETOH abuse   . Hepatitis B   . Lupus anticoagulant disorder ([place]) 02/02/2012  . Pulmonary embolism ([place]) 06/2010    Past Surgical History:  Procedure Laterality Date  . LEFT ELBOW SURGERY    . TONSILLECTOMY      No Known Allergies   Outpatient Medications Prior to Visit  Medication Sig Dispense Refill  . albuterol (PROVENTIL HFA;VENTOLIN HFA) 108 (90 Base) MCG/ACT inhaler Inhale 2 puffs into  the lungs every 6 (six) hours as needed for wheezing or shortness of breath. 1 Inhaler 2  . Ca Carbonate-Mag Hydroxide (ROLAIDS) 550-110 MG CHEW Chew 2 each by mouth daily as needed (indigestion).    . carvedilol (COREG) 3.125 MG tablet Take 1 tablet (3.125 mg total) by mouth 2 (two) times daily with a meal. For high blood pressure 60 tablet 3  . cephALEXin (KEFLEX) 500 MG capsule Take 1 capsule (500 mg total) by mouth 2 (two) times daily. 20 capsule 0  . citalopram (CELEXA) 40 MG tablet Take 1 tablet (40 mg total) by mouth daily. 30 tablet 3  . furosemide (LASIX) 20 MG tablet Take 1 tablet (20 mg total) by mouth daily. 30 tablet 3  . hydrOXYzine (ATARAX/VISTARIL) 25 MG tablet Take 1 tablet (25 mg total) by mouth 3 (three) times daily as needed for anxiety. 60 tablet 3  . mometasone-formoterol (DULERA) 100-5 MCG/ACT AERO Inhale 2 puffs into the lungs 2 (two) times daily. 1 Inhaler 3  . predniSONE (DELTASONE) 20 MG tablet Take 2 tablets (40 mg total) by mouth daily. 8 tablet 0  . rivaroxaban (XARELTO) 20 MG TABS tablet Take 1 tablet (20 mg total) by mouth daily with breakfast. 30 tablet 3  . tiotropium (SPIRIVA HANDIHALER) 18 MCG inhalation capsule Place 1 capsule (18 mcg total) into inhaler and inhale daily. 30 capsule 3   No facility-administered medications prior to visit.     ROS Review of Systems Constitutional: Negative for  activity change and appetite change.  HENT: Negative for sinus pressure and sore throat.   Eyes: Negative for visual disturbance.  Respiratory: Positive for cough Negative for chest tightness or shortness of breath.   Cardiovascular: Positive for leg swelling. Negative for chest pain.  Gastrointestinal: Negative for abdominal distention, abdominal pain, constipation and diarrhea.  Endocrine: Negative.   Genitourinary: Negative for dysuria.  Musculoskeletal: Negative for arthralgias.  Skin: Negative for rash.  Allergic/Immunologic: Negative.   Neurological:  Negative for weakness, light-headedness and numbness.  Psychiatric/Behavioral: Positive for depression, anxiety positive for suicidal ideation, positive for suicidal attempts but failed yesterday .  Objective:  BP 115/76 (BP Location: Right Arm, Patient Position: Sitting, Cuff Size: Large)   Pulse (!) 105   Temp 97.9 F (36.6 C) (Oral)   Ht 6\' 2"  (1.88 m)   Wt 217 lb 6.4 oz (98.6 kg)   SpO2 94%   BMI 27.91 kg/m   BP/Weight 12/21/2016 11/06/2016 03/16/6268  Systolic BP 485 462 703  Diastolic BP 76 63 66  Wt. (Lbs) 217.4 213 -  BMI 27.91 27.35 -  Some encounter information is confidential and restricted. Go to Review Flowsheets activity to see all data.      Physical Exam Constitutional: He is oriented to person, place, and time. He appears well-developed and well-nourished.  Cardiovascular: Normal rate, normal heart sounds and intact distal pulses.   No murmur heard. Pulmonary/Chest: Effort normal and breath sounds normal. Intermittent wheezing in bilateral lung fields Abdominal: Soft. Bowel sounds are normal. He exhibits no distension and no mass. There is no tenderness.  Musculoskeletal:  Nonpitting edema of left leg to the knee with associated erythema of shin, stasis dermatitis  Right leg is normal.  Neurological: He is alert and oriented to person, place, and time.  Psych: Depressed, has an active suicide plan  CMP Latest Ref Rng & Units 09/29/2016 05/31/2016 05/31/2016  Glucose 65 - 99 mg/dL 147(H) 138(H) -  BUN 6 - 20 mg/dL 6 <5(L) -  Creatinine 0.61 - 1.24 mg/dL 0.94 0.71 -  Sodium 135 - 145 mmol/L 140 137 -  Potassium 3.5 - 5.1 mmol/L 3.7 4.2 -  Chloride 101 - 111 mmol/L 108 106 -  CO2 22 - 32 mmol/L 22 22 -  Calcium 8.9 - 10.3 mg/dL 8.5(L) 8.1(L) -  Total Protein 6.5 - 8.1 g/dL - 6.7 6.5  Total Bilirubin 0.3 - 1.2 mg/dL - 0.5 0.6  Alkaline Phos 38 - 126 U/L - 88 84  AST 15 - 41 U/L - 84(H) 92(H)  ALT 17 - 63 U/L - 71(H) 72(H)    Lipid Panel     Component Value  Date/Time   CHOL 199 09/18/2014 0615   TRIG 120 09/18/2014 0615   HDL 94 09/18/2014 0615   CHOLHDL 2.1 09/18/2014 0615   VLDL 24 09/18/2014 0615   LDLCALC 81 09/18/2014 0615    Depression screen PHQ 2/9 12/21/2016  Decreased Interest 3  Down, Depressed, Hopeless 3  PHQ - 2 Score 6  Altered sleeping 3  Tired, decreased energy 3  Change in appetite 0  Feeling bad or failure about yourself  1  Trouble concentrating 1  Moving slowly or fidgety/restless 3  PHQ-9 Score 17    GAD 7 : Generalized Anxiety Score 12/21/2016  Nervous, Anxious, on Edge 3  Control/stop worrying 3  Worry too much - different things 2  Trouble relaxing 1  Restless 1  Easily annoyed or irritable 3  Afraid - awful might  happen 3  Total GAD 7 Score 16     Assessment & Plan:   1. Suicidal ideations/ intents Patient is currently in crisis is exacerbated by poor living situation, homelessness Attempted suicide yesterday but was unsuccessful with his razor blade Informs me he will try again - we have searched his back and found the weapon Patient will be committed to inpatient hospitalization - cops called  2. Severe episode of recurrent major depressive disorder, without psychotic features ([place]) Noncompliant with Celexa and hydroxyzine Has underlying bipolar disorder Referred to [facility] but never went Will need inpatient hospitalization.  3. Other pulmonary embolism without acute cor pulmonale, unspecified chronicity (HCC) Previous history of DVT Currently on lifelong anticoagulation with Xarelto  4. Alcohol abuse with alcohol-induced mood disorder ([facility]) Counselled cessation He is not ready to quit  5. Lupus anticoagulant disorder (HCC) Hypercoagulable disorder predisposing to thrombosis Continue anticoagulant  6. COPD Uncontrolled due to poor compliance Continue Spiriva, Proventil Smoking cessation   No orders of the defined types were placed in this encounter.   Follow-up: Return in  about 3 weeks (around 01/11/2017) for Follow-up on chronic medical conditions.   Arnoldo Morale MD

## 2016-12-21 NOTE — ED Notes (Signed)
Bed: OL41 Expected date:  Expected time:  Means of arrival:  Comments: [employer], voluntary

## 2016-12-21 NOTE — Progress Notes (Signed)
PT ADMITS TO NOT TAKING HIS BLOOD PRESSURE MEDICATIONS.

## 2016-12-21 NOTE — BH Assessment (Signed)
Assessment Note  [name] is an 51 y.o. male. He present to [facility] with increased depression. Patient has suicidal thoughts. He attempted to cut himself with a friends knife but couldn't get the blade out the knife. Today he continues to endorse suicidal thoughts with a plan to cut himself. Stressor includes homelessness, fiances, and lack of support. He has tried to hurt himself previously by jumping in traffic, overdosing on sleeping pills, and cutting his wrist. Triggers for past suicide attempts were lack of support. Patient mentioned that he has also stopped taking all his medications (medical and psychiatric) in the past month. He has the pills in his book bag with him today but sts, "I just gave up on life so I don't take them anymore". He denies current HI. However he is upset with his friend and doesn't feel that he can trust them. He denies history violent or aggressive behaviors. No legal issues. Patient does not have a current psychiatrist or therapist. He was admitted to [facility] in the past ( 02/12/2016, 02/19/2015, 12/26/2014, 09/17/2014, 01/27/2014). Patient reports regular use of alcohol and also smokes THC occasionally.   Diagnosis: Major Depressive Disorder, Recurrent, Severe, without psychotic features and Alcohol Use Disorder  Past Medical History:  Past Medical History:  Diagnosis Date  . Collagen vascular disease ([place])   . COPD (chronic obstructive pulmonary disease) ([place])   . Coronary artery disease   . Degenerative joint disease   . Degenerative joint disease of spine   . Depression   . DVT (deep venous thrombosis) ([place])   . ETOH abuse   . Hepatitis B   . Lupus anticoagulant disorder ([place]) 02/02/2012  . Pulmonary embolism ([place]) 06/2010    Past Surgical History:  Procedure Laterality Date  . LEFT ELBOW SURGERY    . TONSILLECTOMY      Family History:  Family History  Problem Relation Age of Onset  . Cancer Mother     Ovarian    Social History:  reports that he has been  smoking Cigarettes.  He has a 60.00 pack-year smoking history. He has never used smokeless tobacco. He reports that he drinks about 14.4 oz of alcohol per week . He reports that he does not use drugs.  Additional Social History:  Alcohol / Drug Use Pain Medications: See MAR Prescriptions: See MAR Over the Counter: See MAR History of alcohol / drug use?: Yes Longest period of sobriety (when/how long): 'Never" Negative Consequences of Use: Financial, Legal, Personal relationships, Work / School Withdrawal Symptoms: Agitation Substance #1 Name of Substance 1: Alcohol  1 - Age of First Use: 51 yrs old  1 - Amount (size/oz): (2) 12 pack of beer or (2) fifths of liqour 1 - Frequency: daily  1 - Duration: since the age of 25  1 - Last Use / Amount: yesterday  Substance #2 Name of Substance 2: THC 2 - Age of First Use: 51 yrs old  2 - Amount (size/oz): 1 blunt 2 - Frequency: ocassionally 2 - Duration: on-going  2 - Last Use / Amount: 1-2 weeks ago  CIWA: CIWA-Ar BP: 124/78 Pulse Rate: (!) 101 Nausea and Vomiting: no nausea and no vomiting Tactile Disturbances: none Tremor: moderate, with patient's arms extended Auditory Disturbances: very mild harshness or ability to frighten Paroxysmal Sweats: three Visual Disturbances: moderate sensitivity Anxiety: three Headache, Fullness in Head: none present Agitation: normal activity Orientation and Clouding of Sensorium: cannot do serial additions or is uncertain about date CIWA-Ar Total: 15 COWS:  Allergies: No Known Allergies  Home Medications:  (Not in a hospital admission)  OB/GYN Status:  No LMP for male patient.  General Assessment Data [facility] of Assessment: [facility] TTS Assessment: In system Is this a Tele or Face-to-Face Assessment?: Face-to-Face Is this an Initial Assessment or a Re-assessment for this encounter?: Initial Assessment Marital status: Other (comment) Maiden name:  (n/a) Is patient pregnant?: No Pregnancy  Status: No Living Arrangements: Other (Comment) (homeless) Can pt return to current living arrangement?: No Admission Status: Voluntary Is patient capable of signing voluntary admission?: Yes Referral Source: Self/Family/Friend Insurance type:  (Self Pay )     Crisis Care Plan Living Arrangements: Other (Comment) (homeless) Legal Guardian: Other: (no guardian ) Name of Psychiatrist:  (no psychiatrist ) Name of Therapist:  (no therapist )  Education Status Is patient currently in school?: No Current Grade:  (n/a) 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 : Yes Suicidal Intent: Yes-Currently Present Has patient had any suicidal intent within the past 6 months prior to admission? : Yes Is patient at risk for suicide?: Yes Suicidal Plan?: Yes-Currently Present Has patient had any suicidal plan within the past 6 months prior to admission? : Yes Specify Current Suicidal Plan:  (cut self with knife) Access to Means: Yes Specify Access to Suicidal Means:  (access to knife but unable to get blade to work ) What has been your use of drugs/alcohol within the last 12 months?:  (alcohol and THc) Previous Attempts/Gestures: Yes (3 prior attempts ) How many times?:  (overdose; cut wrist; [place]) Other Self Harm Risks:  (denies ) Triggers for Past Attempts: Other (Comment) (depression, no supports, homeless) Intentional Self Injurious Behavior: None Family Suicide History: Unknown Recent stressful life event(s): Other (Comment) (homeless and no supports ) Persecutory voices/beliefs?: No Depression: Yes Depression Symptoms: Feeling angry/irritable, Feeling worthless/self pity, Guilt, Loss of interest in usual pleasures, Tearfulness,  Isolating, Fatigue, Insomnia Substance abuse history and/or treatment for substance abuse?: Yes Suicide prevention information given to non-admitted patients: Not applicable  Risk to Others within the past 6 months Homicidal Ideation: No Does patient have any lifetime risk of violence toward others beyond the six months prior to admission? : No Thoughts of Harm to Others: No Current Homicidal Intent: No Current Homicidal Plan: No Access to Homicidal Means: No Identified Victim:  (n/a) History of harm to others?: No Assessment of Violence: None Noted Violent Behavior Description:  (patient calm and cooperative) Does patient have access to weapons?: No Criminal Charges Pending?: No Does patient have a court date: No Is patient on probation?: No  Psychosis Hallucinations: None noted Delusions: None noted  Mental Status Report Appearance/Hygiene: Disheveled Eye Contact: Good Motor Activity: Freedom of movement Speech: Logical/coherent Level of Consciousness: Alert Mood: Depressed Affect: Appropriate to circumstance Anxiety Level: None Thought Processes: Relevant, Coherent Judgement: Impaired Orientation: Person, Place, Time, Situation Obsessive Compulsive Thoughts/Behaviors: None  Cognitive Functioning Concentration: Decreased Memory: Recent Intact, Remote Intact IQ: Average Insight: Poor Impulse Control: Poor Appetite: Poor ("I don't eat for 2-3 days at a time") Weight Loss:  (none reported) Weight Gain:  (none reported) Sleep: Decreased Total Hours of Sleep:  ("I hardly ever sleep") Vegetative Symptoms: None  ADLScreening [facility] Assessment Services) Patient's cognitive  ability adequate to safely complete daily activities?: Yes Patient able to express need for assistance with ADLs?: Yes Independently performs ADLs?: Yes (appropriate for developmental age)  Prior Inpatient Therapy Prior Inpatient Therapy: Yes Prior Therapy Dates:  [facility]     ADL Screening  (condition at time of admission) Patient's cognitive ability adequate to safely complete daily activities?: Yes Is the patient deaf or have difficulty hearing?: No Does the patient have difficulty seeing, even when wearing glasses/contacts?: No Does the patient have difficulty concentrating, remembering, or making decisions?: No Patient able to express need for assistance with ADLs?: Yes Does the patient have difficulty dressing or bathing?: No Independently performs ADLs?: Yes (appropriate for developmental age) Does the patient have difficulty walking or climbing stairs?: No Weakness of Legs: None Weakness of Arms/Hands: None  Home Assistive Devices/Equipment Home Assistive Devices/Equipment: None    Abuse/Neglect Assessment (Assessment to be complete while patient is alone) Physical Abuse: Denies Verbal Abuse: Denies Sexual Abuse: Denies Exploitation of patient/patient's resources: Denies Self-Neglect: Denies     Regulatory affairs officer (For Healthcare) Does Patient Have a Medical Advance Directive?: No Would patient like information on creating a medical advance directive?: No - Patient declined Nutrition Screen- [place] Adult/[facility] Patient's home diet: Regular        Disposition:  Per Waylan Boga, DNP, patient meets criteria for the OBS unit.     On Site Evaluation by:   Reviewed with Physician:    Waldon Merl 12/21/2016 7:05 PM

## 2016-12-21 NOTE — Patient Instructions (Signed)
Bipolar 1 Disorder Bipolar 1 disorder is a mental health disorder in which a person has episodes of emotional highs (mania), and may also have episodes of emotional lows (depression) in addition to highs. Bipolar 1 disorder is different from other bipolar disorders because it involves extreme manic episodes. These episodes last at least one week or involve symptoms that are so severe that hospitalization is needed to keep the person safe. What increases the risk? The cause of this condition is not known. However, certain factors make you more likely to have bipolar disorder, such as:  Having a family member with the disorder.  An imbalance of certain chemicals in the brain (neurotransmitters).  Stress, such as illness, financial problems, or a death.  Certain conditions that affect the brain or spinal cord (neurologic conditions).  Brain injury (trauma).  Having another mental health disorder, such as:  Obsessive compulsive disorder.  Schizophrenia. What are the signs or symptoms? Symptoms of mania include:  Very high self-esteem or self-confidence.  Decreased need for sleep.  Unusual talkativeness or feeling a need to keep talking. Speech may be very fast. It may seem like you cannot stop talking.  Racing thoughts or constant talking, with quick shifts between topics that may or may not be related (flight of ideas).  Decreased ability to focus or concentrate.  Increased purposeful activity, such as work, studies, or social activity.  Increased nonproductive activity. This could be pacing, squirming and fidgeting, or finger and toe tapping.  Impulsive behavior and poor judgment. This may result in high-risk activities, such as having unprotected sex or spending a lot of money. Symptoms of depression include:  Feeling sad, hopeless, or helpless.  Frequent or uncontrollable crying.  Lack of feeling or caring about anything.  Sleeping too much.  Moving more slowly than  usual.  Not being able to enjoy things you used to enjoy.  Wanting to be alone all the time.  Feeling guilty or worthless.  Lack of energy or motivation.  Trouble concentrating or remembering.  Trouble making decisions.  Increased appetite.  Thoughts of death, or the desire to harm yourself. Sometimes, you may have a mixed mood. This means having symptoms of depression and mania. Stress can make symptoms worse. How is this diagnosed? To diagnose bipolar disorder, your health care provider may ask about your:  Emotional episodes.  Medical history.  Alcohol and drug use. This includes prescription medicines. Certain medical conditions and substances can cause symptoms that seem like bipolar disorder (secondary bipolar disorder). How is this treated? Bipolar disorder is a long-term (chronic) illness. It is best controlled with ongoing (continuous) treatment rather than treatment only when symptoms occur. Treatment may include:  Medicine. Medicine can be prescribed by a provider who specializes in treating mental disorders (psychiatrist).  Medicines called mood stabilizers are usually prescribed.  If symptoms occur even while taking a mood stabilizer, other medicines may be added.  Psychotherapy. Some forms of talk therapy, such as cognitive-behavioral therapy (CBT), can provide support, education, and guidance.  Coping methods, such as journaling or relaxation exercises. These may include:  Yoga.  Meditation.  Deep breathing.  Lifestyle changes, such as:  Limiting alcohol and drug use.  Exercising regularly.  Getting plenty of sleep.  Making healthy eating choices. A combination of medicine, talk therapy, and coping methods is best. A procedure in which electricity is applied to the brain through the scalp (electroconvulsive therapy) may be used in cases of severe mania when medicine and psychotherapy work too slowly  Limiting alcohol and drug use.  ? Exercising regularly.  ? Getting plenty of sleep.  ? Making healthy eating choices.    A combination of medicine, talk therapy, and coping methods is best. A procedure in which electricity is applied to the brain through the scalp (electroconvulsive therapy) may be used in cases of severe mania when medicine and psychotherapy work too slowly or do not work.  Follow these  instructions at home:  Activity     · Return to your normal activities as told by your health care provider.  · Find activities that you enjoy, and make time to do them.  · Exercise regularly as told by your health care provider.  Lifestyle   · Limit alcohol intake to no more than 1 drink a day for nonpregnant women and 2 drinks a day for men. One drink equals 12 oz of beer, 5 oz of wine, or 1½ oz of hard liquor.  · Follow a set schedule for eating and sleeping.  · Eat a balanced diet that includes fresh fruits and vegetables, whole grains, low-fat dairy, and lean meat.  · Get 7-8 hours of sleep each night.  General instructions   · Take over-the-counter and prescription medicines only as told by your health care provider.  · Think about joining a support group. Your health care provider may be able to recommend a support group.  · Talk with your family and loved ones about your treatment goals and how they can help.  · Keep all follow-up visits as told by your health care provider. This is important.  Where to find more information:  For more information about bipolar disorder, visit the following websites:  · National Alliance on Mental Illness: www.nami.org  · U.S. National Institute of Mental Health: www.nimh.nih.gov    Contact a health care provider if:  · Your symptoms get worse.  · You have side effects from your medicine, and they get worse.  · You have trouble sleeping.  · You have trouble doing daily activities.  · You feel unsafe in your surroundings.  · You are dealing with substance abuse.  Get help right away if:  · You have new symptoms.  · You have thoughts about harming yourself.  · You self-harm.  This information is not intended to replace advice given to you by your health care provider. Make sure you discuss any questions you have with your health care provider.  Document Released: 11/30/2000 Document Revised: 04/19/2016 Document Reviewed: 04/23/2016  Elsevier Interactive Patient Education ©  2017 [employer] Inc.

## 2016-12-21 NOTE — ED Notes (Signed)
Nursing Progress Note: 7p-7a D: Pt currently presents with a depressed/sad affect and behavior. Pt states "I have thoughts of hurting myself because I feel so worthless, but I have no plan in here." Forwards little and minimal in response. Pt reports ok sleep with current medication regimen.   A: Pt provided with medications per providers orders. Pt's labs and vitals were monitored throughout the night. Pt supported emotionally and encouraged to express concerns and questions. Pt educated on medications.  R: Pt's safety ensured with 15 minute and environmental checks. Pt currently denies HI and AVH and endorses passive SI. Pt verbally contracts to seek staff if SI/HI or A/VH occurs and to consult with staff before acting on any harmful thoughts. Will continue to monitor.

## 2016-12-21 NOTE — ED Provider Notes (Signed)
[facility] DEPT Provider Note   CSN: [id number] Arrival date & time: 12/21/16  1422     History   Chief Complaint Chief Complaint  Patient presents with  . Suicidal    HPI [name] is a 51 y.o. male who comes in voluntarily for suicidal ideation. Past medical history of bipolar disorder, PTSD and schizophrenia. The patient is also has a history of blood clots and is currently prescribed Xarelto. He has not taken his Xarelto for the past week and does have an active DVT in the left leg, however, he states he also has an IVC filter. The MR shows that IVC filter may not be in place on previous imaging. Patient states he has chronic shortness of breath, but nothing is new. The patient states that he stopped taking all of his medications about 2 weeks ago because he believes that the government is trying to poison him. Last night he decided that he wanted to kill himself and he tried to pull out one of his razor blades, however, something was stuck and he could not open up the X-Acto knife to slit his wrists. He told his physician this morning about that and they sent him here for evaluation. He agrees that he probably needs some help. The patient is also dependent on alcohol and drinks about a case of beer daily. When he does not drink. He endorses hearing a multitude of angry voices, but denies any command hallucinations. The patient states that occasionally he feels acutely once to hurt other people because he feels that his life is threatened on a daily basis. His other drug use.   HPI  Past Medical History:  Diagnosis Date  . Collagen vascular disease ([place])   . COPD (chronic obstructive pulmonary disease) ([place])   . Coronary artery disease   . Degenerative joint disease   . Degenerative joint disease of spine   . Depression   . DVT (deep venous thrombosis) ([place])   . ETOH abuse   . Hepatitis B   . Lupus anticoagulant disorder ([place]) 02/02/2012  . Pulmonary embolism ([place]) 06/2010      Patient Active Problem List   Diagnosis Date Noted  . Syncope and collapse 05/31/2016  . Low back pain 05/31/2016  . Chest pain 05/30/2016  . Left leg swelling   . COPD (chronic obstructive pulmonary disease) ([place]) 04/22/2016  . Acute respiratory failure with hypoxia ([place]) 02/09/2016  . Alcohol dependence with withdrawal, uncomplicated ([place]) [id number]  . Suicidal ideations 02/09/2015  . GAD (generalized anxiety disorder) 09/18/2014  . Recurrent major depression-severe ([place]) 09/18/2014  . Alcohol abuse with alcohol-induced mood disorder ([place]) 09/17/2014  . Left leg DVT ([place]) 06/06/2014  . Upper leg DVT (deep venous thromboembolism), acute ([place]) 06/12/2013  . Homeless single person 06/12/2013  . 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 Lupus anticoagulant disorder ([place]) 02/02/2012  . Degenerative joint disease   . Tobacco abuse 03/01/2011  . Pulmonary embolism ([place]) 06/07/2010    Past Surgical History:  Procedure Laterality Date  . LEFT ELBOW SURGERY    . TONSILLECTOMY         Home Medications    Prior to Admission medications   Medication Sig Start Date End Date Taking? Authorizing Provider  albuterol (PROVENTIL HFA;VENTOLIN HFA) 108 (90 Base) MCG/ACT inhaler Inhale 2 puffs into the lungs every 6 (six) hours as needed for wheezing or shortness of breath. 09/17/16  Yes Arnoldo Morale, MD  Aspirin-Salicylamide-Caffeine (BC HEADACHE POWDER PO) Take 2 packets by mouth daily.  Yes Historical Provider, MD  carvedilol (COREG) 3.125 MG tablet Take 1 tablet (3.125 mg total) by mouth 2 (two) times daily with a meal. For high blood pressure 09/17/16  Yes Arnoldo Morale, MD  citalopram (CELEXA) 40 MG tablet Take 1 tablet (40 mg total) by mouth daily. 09/17/16  Yes Arnoldo Morale, MD  furosemide (LASIX) 20 MG tablet Take 1 tablet (20 mg total) by mouth daily. 09/17/16  Yes Arnoldo Morale, MD  hydrOXYzine (ATARAX/VISTARIL) 25 MG tablet Take 1 tablet (25 mg total) by mouth 3 (three) times daily as  needed for anxiety. 09/17/16  Yes Arnoldo Morale, MD  mometasone-formoterol (DULERA) 100-5 MCG/ACT AERO Inhale 2 puffs into the lungs 2 (two) times daily. 11/06/16  Yes Arnoldo Morale, MD  rivaroxaban (XARELTO) 20 MG TABS tablet Take 1 tablet (20 mg total) by mouth daily with breakfast. 09/17/16  Yes Arnoldo Morale, MD  tiotropium (SPIRIVA HANDIHALER) 18 MCG inhalation capsule Place 1 capsule (18 mcg total) into inhaler and inhale daily. 09/17/16  Yes Arnoldo Morale, MD  cephALEXin (KEFLEX) 500 MG capsule Take 1 capsule (500 mg total) by mouth 2 (two) times daily. Patient not taking: Reported on 12/21/2016 09/17/16   Arnoldo Morale, MD  predniSONE (DELTASONE) 20 MG tablet Take 2 tablets (40 mg total) by mouth daily. Patient not taking: Reported on 12/21/2016 09/29/16   Recardo Evangelist, PA-C    Family History Family History  Problem Relation Age of Onset  . Cancer Mother     Ovarian    Social History Social History  Substance Use Topics  . Smoking status: Current Every Day Smoker    Packs/day: 2.00    Years: 30.00    Types: Cigarettes  . Smokeless tobacco: Never Used  . Alcohol use 14.4 oz/week    24 Cans of beer per week     Comment: everyday- 24 cans     Allergies   Patient has no known allergies.   Review of Systems Review of Systems  Ten systems reviewed and are negative for acute change, except as noted in the HPI.   Physical Exam Updated Vital Signs There were no vitals taken for this visit.  Physical Exam  Constitutional: He appears well-developed and well-nourished. No distress.  HENT:  Head: Normocephalic and atraumatic.  Eyes: Conjunctivae and EOM are normal. Pupils are equal, round, and reactive to light. No scleral icterus.  Neck: Normal range of motion. Neck supple.  Cardiovascular: Normal rate, regular rhythm and normal heart sounds.   Pulmonary/Chest: Effort normal and breath sounds normal. No respiratory distress.  Abdominal: Soft. There is no tenderness.    Musculoskeletal: He exhibits no edema.  Neurological: He is alert.  Skin: Skin is warm and dry. He is not diaphoretic.  Psychiatric: His behavior is normal. His mood appears anxious. Thought content is paranoid and delusional.  Nursing note and vitals reviewed.    ED Treatments / Results  Labs (all labs ordered are listed, but only abnormal results are displayed) Labs Reviewed  RAPID URINE DRUG SCREEN, HOSP PERFORMED  COMPREHENSIVE METABOLIC PANEL  ETHANOL  SALICYLATE LEVEL  ACETAMINOPHEN LEVEL  CBC    EKG  EKG Interpretation None       Radiology No results found.  Procedures Procedures (including critical care time)  Medications Ordered in ED Medications - No data to display   Initial Impression / Assessment and Plan / ED Course  I have reviewed the triage vital signs and the nursing notes.  Pertinent labs & imaging results that  were available during my care of the patient were reviewed by me and considered in my medical decision making (see chart for details).      patient medically clear for psych admission. No evidence of PE.  Final Clinical Impressions(s) / ED Diagnoses   Final diagnoses:  Suicidal ideation  Alcohol abuse    New Prescriptions New Prescriptions   No medications on file     Margarita Mail, PA-C 12/23/16 [place] Yao, MD 12/24/16 [id number]

## 2016-12-21 NOTE — ED Notes (Signed)
Provider contacted for prn medication for withdrawal.

## 2016-12-22 DIAGNOSIS — F17210 Nicotine dependence, cigarettes, uncomplicated: Secondary | ICD-10-CM

## 2016-12-22 DIAGNOSIS — R45851 Suicidal ideations: Secondary | ICD-10-CM

## 2016-12-22 DIAGNOSIS — F332 Major depressive disorder, recurrent severe without psychotic features: Secondary | ICD-10-CM

## 2016-12-22 MED ORDER — CARVEDILOL 3.125 MG PO TABS
3.1250 mg | ORAL_TABLET | Freq: Two times a day (BID) | ORAL | Status: DC
  Administered 2016-12-22 – 2016-12-23 (×3): 3.125 mg via ORAL
  Filled 2016-12-22 (×6): qty 1

## 2016-12-22 MED ORDER — CITALOPRAM HYDROBROMIDE 10 MG PO TABS
10.0000 mg | ORAL_TABLET | Freq: Every day | ORAL | Status: DC
  Administered 2016-12-22 – 2016-12-23 (×2): 10 mg via ORAL
  Filled 2016-12-22 (×2): qty 1

## 2016-12-22 MED ORDER — RIVAROXABAN 20 MG PO TABS
20.0000 mg | ORAL_TABLET | Freq: Every day | ORAL | Status: DC
  Administered 2016-12-23: 20 mg via ORAL
  Filled 2016-12-22 (×2): qty 1

## 2016-12-22 MED ORDER — HYDROXYZINE HCL 25 MG PO TABS
25.0000 mg | ORAL_TABLET | Freq: Three times a day (TID) | ORAL | Status: DC | PRN
  Administered 2016-12-22: 25 mg via ORAL
  Filled 2016-12-22: qty 1

## 2016-12-22 MED ORDER — FUROSEMIDE 20 MG PO TABS
20.0000 mg | ORAL_TABLET | Freq: Every day | ORAL | Status: DC
  Administered 2016-12-23: 20 mg via ORAL
  Filled 2016-12-22 (×2): qty 1

## 2016-12-22 MED ORDER — TIOTROPIUM BROMIDE MONOHYDRATE 18 MCG IN CAPS
18.0000 ug | ORAL_CAPSULE | Freq: Every day | RESPIRATORY_TRACT | Status: DC
  Administered 2016-12-23: 18 ug via RESPIRATORY_TRACT
  Filled 2016-12-22: qty 5

## 2016-12-22 MED ORDER — MOMETASONE FURO-FORMOTEROL FUM 100-5 MCG/ACT IN AERO
2.0000 | INHALATION_SPRAY | Freq: Two times a day (BID) | RESPIRATORY_TRACT | Status: DC
  Administered 2016-12-22 – 2016-12-23 (×2): 2 via RESPIRATORY_TRACT
  Filled 2016-12-22: qty 8.8

## 2016-12-22 NOTE — Progress Notes (Signed)
12/22/16 1342:  LRT went to pt room to offer activities, pt was sleep.  Victorino Sparrow, LRT/CTRS

## 2016-12-22 NOTE — Progress Notes (Signed)
Pt. Assessed and has SI and a plan to cut himself.  Inpatient tx for stabilization is recommended.  CSW faxed referrals as follows:  [facility],  [place],  [place],  [place],  [place] T. Judi Cong, MSW, [place] Work Disposition [phone]

## 2016-12-22 NOTE — ED Notes (Signed)
Pt complaint with medication regimen. Pt endorsing depression. Pt endorsing passive SI. Pt withdrawn. Encouragement and support provided. CIWA protocol in place. Special checks q 15 mins in place for safety. Video monitoring in place. Will continue to monitor.

## 2016-12-22 NOTE — Consult Note (Signed)
[facility] Face-to-Face Psychiatry Consult   Reason for Consult:   Alcohol abuse with suicide plan Referring Physician:  EDP Patient Identification: [name] MRN:  [id number] Principal Diagnosis: Major depressive disorder, recurrent severe without psychotic features [facility]) Diagnosis:   Patient Active Problem List   Diagnosis Date Noted  . Alcohol dependence with withdrawal, uncomplicated ([place]) [X54.008] 02/09/2015    Priority: High  . Suicidal ideations [R45.851] 02/09/2015    Priority: High  . Major depressive disorder, recurrent severe without psychotic features ([place]) [F33.2] 09/18/2014    Priority: High  . Syncope and collapse [R55] 05/31/2016  . Low back pain [M54.5] 05/31/2016  . Chest pain [R07.9] 05/30/2016  . Left leg swelling [M79.89]   . COPD (chronic obstructive pulmonary disease) ([place]) [J44.9] 04/22/2016  . Acute respiratory failure with hypoxia ([place]) [J96.01] 02/09/2016  . GAD (generalized anxiety disorder) [F41.1] 09/18/2014  . Alcohol abuse with alcohol-induced mood disorder ([place]) [F10.14] 09/17/2014  . Left leg DVT ([place]) [I82.402] 06/06/2014  . Upper leg DVT (deep venous thromboembolism), acute ([place]) [I82.4Y9] 06/12/2013  . Homeless single person [Z59.0] 06/12/2013  . 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 Lupus anticoagulant disorder ([address]) [D68.62] 02/02/2012  . Degenerative joint disease [M19.90]   . Tobacco abuse [Z72.0] 03/01/2011  . Pulmonary embolism ([place]) [I26.99] 06/07/2010    Total Time spent with patient: 45 minutes  Subjective:   [name] is a 51 y.o. male patient admitted with suicide plan and alcohol detox.  HPI:  51 yo male who presented to the ED with suicide plan to cut himself.  Drinks daily, heavily.  Denies drug use and homicidal ideations.    Past Psychiatric History: alcohol dependence, depression  Risk to Self: Suicidal Ideation: Yes-Currently Present Suicidal Intent: Yes-Currently Present Is patient at risk for suicide?:  Yes Suicidal Plan?: Yes-Currently Present Specify Current Suicidal Plan:  (cut self with knife) Access to Means: Yes Specify Access to Suicidal Means:  (access to knife but unable to get blade to work ) What has been your use of drugs/alcohol within the last 12 months?:  (alcohol and THc) How many times?:  (overdose; cut wrist[place] Other Self Harm Risks:  (denies ) Triggers for Past Attempts: Other (Comment) (depression, no supports, homeless) Intentional Self Injurious Behavior: None Risk to Others: Homicidal Ideation: No Thoughts of Harm to Others: No Current Homicidal Intent: No Current Homicidal Plan: No Access to Homicidal Means: No Identified Victim:  (n/a) History of harm to others?: No Assessment of Violence: None Noted Violent Behavior Description:  (patient calm and cooperative) Does patient have access to weapons?: No Criminal Charges Pending?: No Does patient have a court date: No Prior Inpatient Therapy: Prior Inpatient Therapy: Yes Prior Therapy Dates:  [facility] Prior Outpatient Therapy:    Past Medical History:  Past Medical History:  Diagnosis Date  . Collagen vascular disease ([place])   . COPD (chronic obstructive pulmonary disease) ([place])   . Coronary artery disease   . Degenerative joint disease   . Degenerative joint disease of spine   . Depression   . DVT (deep venous thrombosis) ([place])   . ETOH abuse   . Hepatitis B   . Lupus anticoagulant disorder ([place]) 02/02/2012  . Pulmonary embolism ([place]) 06/2010    Past Surgical History:  Procedure Laterality Date  . LEFT ELBOW SURGERY    . TONSILLECTOMY     Family History:  Family History  Problem Relation Age of Onset  . Cancer Mother  Ovarian   Family Psychiatric  History: substance abuse Social History:  History  Alcohol Use  . 14.4 oz/week  . 24 Cans of beer per week    Comment: everyday- 24 cans     History  Drug Use No    Social History   Social History   . Marital status: Legally Separated    Spouse name: N/A  . Number of children: N/A  . Years of education: N/A   Occupational History  . Homeless   .  Not Employed   Social History Main Topics  . Smoking status: Current Every Day Smoker    Packs/day: 2.00    Years: 30.00    Types: Cigarettes  . Smokeless tobacco: Never Used  . Alcohol use 14.4 oz/week    24 Cans of beer per week     Comment: everyday- 24 cans  . Drug use: No  . Sexual activity: No   Other Topics Concern  . None   Social History Narrative   Estranged from family.  Homeless.     Additional Social History:    Allergies:  No Known Allergies  Labs:  Results for orders placed or performed during the hospital encounter of 12/21/16 (from the past 48 hour(s))  Rapid urine drug screen (hospital performed)     Status: None   Collection Time: 12/21/16  2:40 PM  Result Value Ref Range   Opiates NONE DETECTED NONE DETECTED   Cocaine NONE DETECTED NONE DETECTED   Benzodiazepines NONE DETECTED NONE DETECTED   Amphetamines NONE DETECTED NONE DETECTED   Tetrahydrocannabinol NONE DETECTED NONE DETECTED   Barbiturates NONE DETECTED NONE DETECTED    Comment:        DRUG SCREEN FOR MEDICAL PURPOSES ONLY.  IF CONFIRMATION IS NEEDED FOR ANY PURPOSE, NOTIFY LAB WITHIN 5 DAYS.        LOWEST DETECTABLE LIMITS FOR URINE DRUG SCREEN Drug Class       Cutoff (ng/mL) Amphetamine      1000 Barbiturate      200 Benzodiazepine   166 Tricyclics       063 Opiates          300 Cocaine          300 THC              50   Comprehensive metabolic panel     Status: Abnormal   Collection Time: 12/21/16  3:00 PM  Result Value Ref Range   Sodium 137 135 - 145 mmol/L   Potassium 4.1 3.5 - 5.1 mmol/L   Chloride 103 101 - 111 mmol/L   CO2 22 22 - 32 mmol/L   Glucose, Bld 82 65 - 99 mg/dL   BUN <5 (L) 6 - 20 mg/dL   Creatinine, Ser 0.62 0.61 - 1.24 mg/dL   Calcium 8.5 (L) 8.9 - 10.3 mg/dL   Total Protein 7.5 6.5 - 8.1 g/dL    Albumin 3.8 3.5 - 5.0 g/dL   AST 140 (H) 15 - 41 U/L   ALT 87 (H) 17 - 63 U/L   Alkaline Phosphatase 87 38 - 126 U/L   Total Bilirubin 0.7 0.3 - 1.2 mg/dL   GFR calc non Af Amer >60 >60 mL/min   GFR calc Af Amer >60 >60 mL/min    Comment: (NOTE) The eGFR has been calculated using the CKD EPI equation. This calculation has not been validated in all clinical situations. eGFR's persistently <60 mL/min signify possible Chronic Kidney Disease.  Anion gap 12 5 - 15  Ethanol     Status: Abnormal   Collection Time: 12/21/16  3:00 PM  Result Value Ref Range   Alcohol, Ethyl (B) 204 (H) <5 mg/dL    Comment:        LOWEST DETECTABLE LIMIT FOR SERUM ALCOHOL IS 5 mg/dL FOR MEDICAL PURPOSES ONLY   Salicylate level     Status: None   Collection Time: 12/21/16  3:00 PM  Result Value Ref Range   Salicylate Lvl <1.8 2.8 - 30.0 mg/dL  Acetaminophen level     Status: Abnormal   Collection Time: 12/21/16  3:00 PM  Result Value Ref Range   Acetaminophen (Tylenol), Serum <10 (L) 10 - 30 ug/mL    Comment:        THERAPEUTIC CONCENTRATIONS VARY SIGNIFICANTLY. A RANGE OF 10-30 ug/mL MAY BE AN EFFECTIVE CONCENTRATION FOR MANY PATIENTS. HOWEVER, SOME ARE BEST TREATED AT CONCENTRATIONS OUTSIDE THIS RANGE. ACETAMINOPHEN CONCENTRATIONS >150 ug/mL AT 4 HOURS AFTER INGESTION AND >50 ug/mL AT 12 HOURS AFTER INGESTION ARE OFTEN ASSOCIATED WITH TOXIC REACTIONS.   cbc     Status: Abnormal   Collection Time: 12/21/16  3:00 PM  Result Value Ref Range   WBC 4.9 4.0 - 10.5 K/uL   RBC 4.54 4.22 - 5.81 MIL/uL   Hemoglobin 14.8 13.0 - 17.0 g/dL   HCT 44.3 39.0 - 52.0 %   MCV 97.6 78.0 - 100.0 fL   MCH 32.6 26.0 - 34.0 pg   MCHC 33.4 30.0 - 36.0 g/dL   RDW 13.7 11.5 - 15.5 %   Platelets 106 (L) 150 - 400 K/uL    Comment: CONSISTENT WITH PREVIOUS RESULT    Current Facility-Administered Medications  Medication Dose Route Frequency Provider Last Rate Last Dose  . LORazepam (ATIVAN) tablet 0-4 mg   0-4 mg Oral Q6H Abigail Harris, PA-C   1 mg at 12/22/16 1214   Followed by  . [START ON 12/23/2016] LORazepam (ATIVAN) tablet 0-4 mg  0-4 mg Oral Q12H Abigail Harris, PA-C      . thiamine (VITAMIN B-1) tablet 100 mg  100 mg Oral Daily Margarita Mail, PA-C   100 mg at 12/22/16 5631   Or  . thiamine (B-1) injection 100 mg  100 mg Intravenous Daily Margarita Mail, PA-C       Current Outpatient Prescriptions  Medication Sig Dispense Refill  . albuterol (PROVENTIL HFA;VENTOLIN HFA) 108 (90 Base) MCG/ACT inhaler Inhale 2 puffs into the lungs every 6 (six) hours as needed for wheezing or shortness of breath. 1 Inhaler 2  . Aspirin-Salicylamide-Caffeine (BC HEADACHE POWDER PO) Take 2 packets by mouth daily.    . carvedilol (COREG) 3.125 MG tablet Take 1 tablet (3.125 mg total) by mouth 2 (two) times daily with a meal. For high blood pressure 60 tablet 3  . citalopram (CELEXA) 40 MG tablet Take 1 tablet (40 mg total) by mouth daily. 30 tablet 3  . furosemide (LASIX) 20 MG tablet Take 1 tablet (20 mg total) by mouth daily. 30 tablet 3  . hydrOXYzine (ATARAX/VISTARIL) 25 MG tablet Take 1 tablet (25 mg total) by mouth 3 (three) times daily as needed for anxiety. 60 tablet 3  . mometasone-formoterol (DULERA) 100-5 MCG/ACT AERO Inhale 2 puffs into the lungs 2 (two) times daily. 1 Inhaler 3  . rivaroxaban (XARELTO) 20 MG TABS tablet Take 1 tablet (20 mg total) by mouth daily with breakfast. 30 tablet 3  . tiotropium (SPIRIVA HANDIHALER) 18 MCG inhalation capsule  Place 1 capsule (18 mcg total) into inhaler and inhale daily. 30 capsule 3  . cephALEXin (KEFLEX) 500 MG capsule Take 1 capsule (500 mg total) by mouth 2 (two) times daily. (Patient not taking: Reported on 12/21/2016) 20 capsule 0  . predniSONE (DELTASONE) 20 MG tablet Take 2 tablets (40 mg total) by mouth daily. (Patient not taking: Reported on 12/21/2016) 8 tablet 0    Musculoskeletal: Strength & Muscle Tone: within normal limits Gait & Station:  normal Patient leans: N/A  Psychiatric Specialty Exam: Physical Exam  Constitutional: He is oriented to person, place, and time. He appears well-developed and well-nourished.  HENT:  Head: Normocephalic.  Neck: Normal range of motion.  Respiratory: Effort normal.  Musculoskeletal: Normal range of motion.  Neurological: He is alert and oriented to person, place, and time.  Psychiatric: His speech is normal and behavior is normal. Judgment normal. Cognition and memory are normal. He exhibits a depressed mood. He expresses suicidal ideation. He expresses suicidal plans.    Review of Systems  Psychiatric/Behavioral: Positive for depression and substance abuse. The patient is nervous/anxious.   All other systems reviewed and are negative.   Blood pressure 117/60, pulse 100, temperature 98.7 F (37.1 C), temperature source Oral, resp. rate 17, height _0  (1.88 m), weight 97.5 kg (215 lb), SpO2 95 %.Body mass index is 27.6 kg/m.  General Appearance: Disheveled  Eye Contact:  Fair  Speech:  Normal Rate  Volume:  Normal  Mood:  Depressed  Affect:  Congruent  Thought Process:  Coherent and Descriptions of Associations: Intact  Orientation:  Full (Time, Place, and Person)  Thought Content:  Rumination  Suicidal Thoughts:  Yes.  with intent/plan  Homicidal Thoughts:  No  Memory:  Immediate;   Fair Recent;   Fair Remote;   Fair  Judgement:  Impaired  Insight:  Fair  Psychomotor Activity:  Decreased  Concentration:  Concentration: Fair and Attention Span: Fair  Recall:  [employer] of Knowledge:  Fair  Language:  Good  Akathisia:  No  Handed:  Right  AIMS (if indicated):     Assets:  Leisure Time Physical Health Resilience  ADL's:  Intact  Cognition:  WNL  Sleep:        Treatment Plan Summary: Daily contact with patient to assess and evaluate symptoms and progress in treatment, Medication management and Plan major depressive disorder, recurrent, severe without psychosis:   -Crisis stabilization -Medication management:  Ativan alcohol detox protocol started along with medical medications. -Individual and substance abuse counseling  Disposition: Recommend psychiatric Inpatient admission when medically cleared.  Waylan Boga, NP 12/22/2016 5:08 PM  Patient seen face-to-face for psychiatric evaluation, chart reviewed and case discussed with the physician extender and developed treatment plan. Reviewed the information documented and agree with the treatment plan. Corena Pilgrim, MD

## 2016-12-23 ENCOUNTER — Observation Stay (HOSPITAL_COMMUNITY)
Admission: AD | Admit: 2016-12-23 | Discharge: 2016-12-24 | Disposition: A | Discharge status: Home / Self Care | Payer: Self-pay | Source: Intra-hospital | Attending: Psychiatry | Admitting: Psychiatry

## 2016-12-23 ENCOUNTER — Encounter (HOSPITAL_COMMUNITY): Payer: Self-pay

## 2016-12-23 DIAGNOSIS — F1020 Alcohol dependence, uncomplicated: Secondary | ICD-10-CM | POA: Insufficient documentation

## 2016-12-23 DIAGNOSIS — Z7901 Long term (current) use of anticoagulants: Secondary | ICD-10-CM | POA: Insufficient documentation

## 2016-12-23 DIAGNOSIS — F332 Major depressive disorder, recurrent severe without psychotic features: Principal | ICD-10-CM | POA: Diagnosis present

## 2016-12-23 DIAGNOSIS — Z79899 Other long term (current) drug therapy: Secondary | ICD-10-CM | POA: Insufficient documentation

## 2016-12-23 DIAGNOSIS — J449 Chronic obstructive pulmonary disease, unspecified: Secondary | ICD-10-CM | POA: Insufficient documentation

## 2016-12-23 DIAGNOSIS — Y907 Blood alcohol level of 200-239 mg/100 ml: Secondary | ICD-10-CM | POA: Insufficient documentation

## 2016-12-23 DIAGNOSIS — F17210 Nicotine dependence, cigarettes, uncomplicated: Secondary | ICD-10-CM | POA: Insufficient documentation

## 2016-12-23 DIAGNOSIS — Z7982 Long term (current) use of aspirin: Secondary | ICD-10-CM | POA: Insufficient documentation

## 2016-12-23 DIAGNOSIS — Z86711 Personal history of pulmonary embolism: Secondary | ICD-10-CM | POA: Insufficient documentation

## 2016-12-23 DIAGNOSIS — I2510 Atherosclerotic heart disease of native coronary artery without angina pectoris: Secondary | ICD-10-CM | POA: Insufficient documentation

## 2016-12-23 DIAGNOSIS — Z590 Homelessness: Secondary | ICD-10-CM | POA: Insufficient documentation

## 2016-12-23 DIAGNOSIS — Z7951 Long term (current) use of inhaled steroids: Secondary | ICD-10-CM | POA: Insufficient documentation

## 2016-12-23 MED ORDER — ONDANSETRON 4 MG PO TBDP: 4.0000 mg | ORAL_TABLET | Freq: Four times a day (QID) | ORAL | Status: DC | PRN

## 2016-12-23 MED ORDER — CARVEDILOL 3.125 MG PO TABS
3.1250 mg | ORAL_TABLET | Freq: Two times a day (BID) | ORAL | Status: DC
  Filled 2016-12-23: qty 1

## 2016-12-23 MED ORDER — ALUM & MAG HYDROXIDE-SIMETH 200-200-20 MG/5ML PO SUSP: 30.0000 mL | ORAL | Status: DC | PRN

## 2016-12-23 MED ORDER — THIAMINE HCL 100 MG/ML IJ SOLN
100.0000 mg | Freq: Once | INTRAMUSCULAR | Status: AC
  Administered 2016-12-23: 100 mg via INTRAMUSCULAR
  Filled 2016-12-23: qty 2

## 2016-12-23 MED ORDER — FUROSEMIDE 20 MG PO TABS
20.0000 mg | ORAL_TABLET | Freq: Every day | ORAL | Status: DC
  Administered 2016-12-24: 20 mg via ORAL
  Filled 2016-12-23: qty 1

## 2016-12-23 MED ORDER — ACETAMINOPHEN 325 MG PO TABS: 650.0000 mg | ORAL_TABLET | Freq: Four times a day (QID) | ORAL | Status: DC | PRN

## 2016-12-23 MED ORDER — RIVAROXABAN 20 MG PO TABS
20.0000 mg | ORAL_TABLET | Freq: Every day | ORAL | Status: DC
  Administered 2016-12-24: 20 mg via ORAL
  Filled 2016-12-23 (×3): qty 1

## 2016-12-23 MED ORDER — CHLORDIAZEPOXIDE HCL 25 MG PO CAPS: 25.0000 mg | ORAL_CAPSULE | Freq: Three times a day (TID) | ORAL | Status: DC

## 2016-12-23 MED ORDER — CHLORDIAZEPOXIDE HCL 25 MG PO CAPS
25.0000 mg | ORAL_CAPSULE | Freq: Four times a day (QID) | ORAL | Status: DC
  Administered 2016-12-23 – 2016-12-24 (×4): 25 mg via ORAL
  Filled 2016-12-23 (×3): qty 1

## 2016-12-23 MED ORDER — HYDROXYZINE HCL 25 MG PO TABS: 25.0000 mg | ORAL_TABLET | Freq: Four times a day (QID) | ORAL | Status: DC | PRN

## 2016-12-23 MED ORDER — CHLORDIAZEPOXIDE HCL 25 MG PO CAPS: 25.0000 mg | ORAL_CAPSULE | ORAL | Status: DC

## 2016-12-23 MED ORDER — VITAMIN B-1 100 MG PO TABS
100.0000 mg | ORAL_TABLET | Freq: Every day | ORAL | Status: DC
  Administered 2016-12-24: 100 mg via ORAL
  Filled 2016-12-23: qty 1

## 2016-12-23 MED ORDER — MAGNESIUM HYDROXIDE 400 MG/5ML PO SUSP: 30.0000 mL | Freq: Every day | ORAL | Status: DC | PRN

## 2016-12-23 MED ORDER — TRAZODONE HCL 50 MG PO TABS
50.0000 mg | ORAL_TABLET | Freq: Every evening | ORAL | Status: DC | PRN
  Administered 2016-12-23: 50 mg via ORAL
  Filled 2016-12-23: qty 1

## 2016-12-23 MED ORDER — ADULT MULTIVITAMIN W/MINERALS CH
1.0000 | ORAL_TABLET | Freq: Every day | ORAL | Status: DC
  Administered 2016-12-23 – 2016-12-24 (×2): 1 via ORAL
  Filled 2016-12-23 (×2): qty 1

## 2016-12-23 MED ORDER — CHLORDIAZEPOXIDE HCL 25 MG PO CAPS
25.0000 mg | ORAL_CAPSULE | Freq: Four times a day (QID) | ORAL | Status: DC | PRN
  Filled 2016-12-23: qty 1

## 2016-12-23 MED ORDER — CITALOPRAM HYDROBROMIDE 10 MG PO TABS
10.0000 mg | ORAL_TABLET | Freq: Every day | ORAL | Status: DC
  Administered 2016-12-24: 10 mg via ORAL
  Filled 2016-12-23: qty 1

## 2016-12-23 MED ORDER — MOMETASONE FURO-FORMOTEROL FUM 100-5 MCG/ACT IN AERO
2.0000 | INHALATION_SPRAY | Freq: Two times a day (BID) | RESPIRATORY_TRACT | Status: DC
  Administered 2016-12-24: 2 via RESPIRATORY_TRACT
  Filled 2016-12-23: qty 8.8

## 2016-12-23 MED ORDER — LOPERAMIDE HCL 2 MG PO CAPS: 2.0000 mg | ORAL_CAPSULE | ORAL | Status: DC | PRN

## 2016-12-23 MED ORDER — CHLORDIAZEPOXIDE HCL 25 MG PO CAPS: 25.0000 mg | ORAL_CAPSULE | Freq: Every day | ORAL | Status: DC

## 2016-12-23 MED ORDER — TIOTROPIUM BROMIDE MONOHYDRATE 18 MCG IN CAPS
18.0000 ug | ORAL_CAPSULE | Freq: Every day | RESPIRATORY_TRACT | Status: DC
  Administered 2016-12-24: 18 ug via RESPIRATORY_TRACT
  Filled 2016-12-23: qty 5

## 2016-12-23 MED ORDER — HYDROXYZINE HCL 25 MG PO TABS: 25.0000 mg | ORAL_TABLET | Freq: Three times a day (TID) | ORAL | Status: DC | PRN

## 2016-12-23 NOTE — Progress Notes (Signed)
Pt in bed at shift change.  Pt denies pain or discomfort.  Pt currently denies SI, HI and AVH.  Pt contracts for safety verbally.  Pt c/o tremors and sts he had medication a short while ago and feels a little better.  Pt sts he would like something to eat. Pt is directed to where snacks are and encouraged to drink water to help with w/d symptoms. Pt gets snack and returns to bed. Pt continuously observed for safety while on unit except when in the bathroom. Pt remains safe on unit.

## 2016-12-23 NOTE — Progress Notes (Signed)
12/23/16 1351:  LRT went to pt room to offer activities, pt was sleep.  Victorino Sparrow, LRT/CTRS

## 2016-12-23 NOTE — BH Assessment (Signed)
[place] Assessment Progress Note  Per Hampton Abbot, MD, this pt would benefit from admission to a residential substance abuse treatment facility.  At her request, this writer contacted [facility] and [facility], and after finding that both anticipate having male detox beds available today, I faxed referral information to them.  At 13:23 Morene Rankins at [facility] reports that they have received information, but have declined pt for admission due to medical acuity.  At 13:24 Shayla at [facility] reports that they have received information.  She agrees to staff pt with their provider and call me back with a decision.  As of this writing, return call is pending.  Jalene Mullet, [place] Triage Specialist [phone]

## 2016-12-23 NOTE — ED Notes (Signed)
Introduced self to patient.  Pt oriented to unit expectations.  Assessed pt for:  A) Anxiety &/or agitation: Pt reports SI and withdrawal symptoms from alcohol. [place] said [place] two fifths of alcohol a day. He appears shaky.  S) Safety: Safety maintained with q-15-minute checks and hourly rounds by staff.  A) ADLs: Pt able to perform ADLs independently.  P) Pick-Up (room cleanliness): Pt's room clean and free of clutter.

## 2016-12-23 NOTE — ED Notes (Signed)
Pt transported to [facility] by [employer]. All belongings returned to pt who signed for same. Pt calm and cooperative. He did not appear to be suffering from any withdrawal a this time, except for fine hand tremor, and voiced no complaints. VS WNL.

## 2016-12-23 NOTE — Progress Notes (Signed)
[facility] MD Progress Note  12/23/2016 10:25 AM [name]  MRN:  [id number] Subjective:   Patient is oriented to place only and is unsure of today's date/year. Patient denies active S/I, H/I or AVH but did admit to "having a bad drinking problem."Patient admits to ETOH use reporting that he consumes over 1/2 a gallon of liquor daily with last use on 12/22/16 when patient reported he drank a unknown amount. Patient stated "he drank until he passed out." Patient denies any thoughts of self harm this date although reports ongoing stressors to include: lack of a support network, fiances, and feelings of hoplessness  Principal Problem: Major depressive disorder, recurrent severe without psychotic features ([place]) Diagnosis:   Patient Active Problem List   Diagnosis Date Noted  . Syncope and collapse [R55] 05/31/2016  . Low back pain [M54.5] 05/31/2016  . Chest pain [R07.9] 05/30/2016  . Left leg swelling [M79.89]   . COPD (chronic obstructive pulmonary disease) ([place]) [J44.9] 04/22/2016  . Acute respiratory failure with hypoxia ([place]) [J96.01] 02/09/2016  . Alcohol dependence with withdrawal, uncomplicated ([place]) [W65.681] 02/09/2015  . Suicidal ideations [R45.851] 02/09/2015  . GAD (generalized anxiety disorder) [F41.1] 09/18/2014  . Major depressive disorder, recurrent severe without psychotic features ([place]) [F33.2] 09/18/2014  . Alcohol abuse with alcohol-induced mood disorder ([place]) [F10.14] 09/17/2014  . Left leg DVT ([place]) [I82.402] 06/06/2014  . Upper leg DVT (deep venous thromboembolism), acute ([place]) [I82.4Y9] 06/12/2013  . Homeless single person [Z59.0] 06/12/2013  . 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 Lupus anticoagulant disorder ([place]) [D68.62] 02/02/2012  . Degenerative joint disease [M19.90]   . Tobacco abuse [Z72.0] 03/01/2011  . Pulmonary embolism ([place]) [I26.99] 06/07/2010   Total Time spent with patient: 20 minutes  Past Psychiatric History: History of alcohol abuse  disorder  Past Medical History:  Past Medical History:  Diagnosis Date  . Collagen vascular disease ([place])   . COPD (chronic obstructive pulmonary disease) ([place])   . Coronary artery disease   . Degenerative joint disease   . Degenerative joint disease of spine   . Depression   . DVT (deep venous thrombosis) ([place])   . ETOH abuse   . Hepatitis B   . Lupus anticoagulant disorder ([place]) 02/02/2012  . Pulmonary embolism ([place]) 06/2010    Past Surgical History:  Procedure Laterality Date  . LEFT ELBOW SURGERY    . TONSILLECTOMY     Family History:  Family History  Problem Relation Age of Onset  . Cancer Mother     Ovarian   Family Psychiatric  History: history of substance use disorders Social History:  History  Alcohol Use  . 14.4 oz/week  . 24 Cans of beer per week    Comment: everyday- 24 cans     History  Drug Use No    Social History   Social History  . Marital status: Legally Separated    Spouse name: N/A  . Number of children: N/A  . Years of education: N/A   Occupational History  . Homeless   .  Not Employed   Social History Main Topics  . Smoking status: Current Every Day Smoker    Packs/day: 2.00    Years: 30.00    Types: Cigarettes  . Smokeless tobacco: Never Used  . Alcohol use 14.4 oz/week    24 Cans of beer per week     Comment: everyday- 24 cans  . Drug use: No  . Sexual activity: No   Other Topics Concern  . None  Social History Narrative   Estranged from family.  Homeless.     Additional Social History:    Pain Medications: See MAR Prescriptions: See MAR Over the Counter: See MAR History of alcohol / drug use?: Yes Longest period of sobriety (when/how long): 'Never" Negative Consequences of Use: Financial, Legal, Personal relationships, Work / School Withdrawal Symptoms: Agitation Name of Substance 1: Alcohol  1 - Age of First Use: 51 yrs old  1 - Amount (size/oz): (2) 12 pack of beer or (2) fifths of liqour 1 - Frequency: daily   1 - Duration: since the age of 86  1 - Last Use / Amount: yesterday  Name of Substance 2: THC 2 - Age of First Use: 51 yrs old  2 - Amount (size/oz): 1 blunt 2 - Frequency: ocassionally 2 - Duration: on-going  2 - Last Use / Amount: 1-2 weeks ago                Sleep: Fair  Appetite:  Fair  Current Medications: Current Facility-Administered Medications  Medication Dose Route Frequency Provider Last Rate Last Dose  . carvedilol (COREG) tablet 3.125 mg  3.125 mg Oral BID WC Patrecia Pour, NP   3.125 mg at 12/23/16 0943  . citalopram (CELEXA) tablet 10 mg  10 mg Oral Daily Patrecia Pour, NP   10 mg at 12/23/16 0943  . furosemide (LASIX) tablet 20 mg  20 mg Oral Daily Patrecia Pour, NP   20 mg at 12/23/16 0943  . hydrOXYzine (ATARAX/VISTARIL) tablet 25 mg  25 mg Oral TID PRN Patrecia Pour, NP   25 mg at 12/22/16 2148  . LORazepam (ATIVAN) tablet 0-4 mg  0-4 mg Oral Q6H Margarita Mail, PA-C   2 mg at 12/23/16 9450   Followed by  . LORazepam (ATIVAN) tablet 0-4 mg  0-4 mg Oral Q12H Abigail Harris, PA-C      . mometasone-formoterol (DULERA) 100-5 MCG/ACT inhaler 2 puff  2 puff Inhalation BID Patrecia Pour, NP   2 puff at 12/23/16 0945  . rivaroxaban (XARELTO) tablet 20 mg  20 mg Oral Q breakfast Patrecia Pour, NP   20 mg at 12/23/16 0944  . thiamine (VITAMIN B-1) tablet 100 mg  100 mg Oral Daily Margarita Mail, PA-C   100 mg at 12/23/16 3888   Or  . thiamine (B-1) injection 100 mg  100 mg Intravenous Daily Margarita Mail, PA-C      . tiotropium (SPIRIVA) inhalation capsule 18 mcg  18 mcg Inhalation Daily Patrecia Pour, NP   18 mcg at 12/23/16 0945   Current Outpatient Prescriptions  Medication Sig Dispense Refill  . albuterol (PROVENTIL HFA;VENTOLIN HFA) 108 (90 Base) MCG/ACT inhaler Inhale 2 puffs into the lungs every 6 (six) hours as needed for wheezing or shortness of breath. 1 Inhaler 2  . Aspirin-Salicylamide-Caffeine (BC HEADACHE POWDER PO) Take 2 packets by mouth  daily.    . carvedilol (COREG) 3.125 MG tablet Take 1 tablet (3.125 mg total) by mouth 2 (two) times daily with a meal. For high blood pressure 60 tablet 3  . citalopram (CELEXA) 40 MG tablet Take 1 tablet (40 mg total) by mouth daily. 30 tablet 3  . furosemide (LASIX) 20 MG tablet Take 1 tablet (20 mg total) by mouth daily. 30 tablet 3  . hydrOXYzine (ATARAX/VISTARIL) 25 MG tablet Take 1 tablet (25 mg total) by mouth 3 (three) times daily as needed for anxiety. 60 tablet 3  . mometasone-formoterol (  DULERA) 100-5 MCG/ACT AERO Inhale 2 puffs into the lungs 2 (two) times daily. 1 Inhaler 3  . rivaroxaban (XARELTO) 20 MG TABS tablet Take 1 tablet (20 mg total) by mouth daily with breakfast. 30 tablet 3  . tiotropium (SPIRIVA HANDIHALER) 18 MCG inhalation capsule Place 1 capsule (18 mcg total) into inhaler and inhale daily. 30 capsule 3  . cephALEXin (KEFLEX) 500 MG capsule Take 1 capsule (500 mg total) by mouth 2 (two) times daily. (Patient not taking: Reported on 12/21/2016) 20 capsule 0  . predniSONE (DELTASONE) 20 MG tablet Take 2 tablets (40 mg total) by mouth daily. (Patient not taking: Reported on 12/21/2016) 8 tablet 0    Lab Results:  Results for orders placed or performed during the hospital encounter of 12/21/16 (from the past 48 hour(s))  Rapid urine drug screen (hospital performed)     Status: None   Collection Time: 12/21/16  2:40 PM  Result Value Ref Range   Opiates NONE DETECTED NONE DETECTED   Cocaine NONE DETECTED NONE DETECTED   Benzodiazepines NONE DETECTED NONE DETECTED   Amphetamines NONE DETECTED NONE DETECTED   Tetrahydrocannabinol NONE DETECTED NONE DETECTED   Barbiturates NONE DETECTED NONE DETECTED    Comment:        DRUG SCREEN FOR MEDICAL PURPOSES ONLY.  IF CONFIRMATION IS NEEDED FOR ANY PURPOSE, NOTIFY LAB WITHIN 5 DAYS.        LOWEST DETECTABLE LIMITS FOR URINE DRUG SCREEN Drug Class       Cutoff (ng/mL) Amphetamine      1000 Barbiturate       200 Benzodiazepine   758 Tricyclics       832 Opiates          300 Cocaine          300 THC              50   Comprehensive metabolic panel     Status: Abnormal   Collection Time: 12/21/16  3:00 PM  Result Value Ref Range   Sodium 137 135 - 145 mmol/L   Potassium 4.1 3.5 - 5.1 mmol/L   Chloride 103 101 - 111 mmol/L   CO2 22 22 - 32 mmol/L   Glucose, Bld 82 65 - 99 mg/dL   BUN <5 (L) 6 - 20 mg/dL   Creatinine, Ser 0.62 0.61 - 1.24 mg/dL   Calcium 8.5 (L) 8.9 - 10.3 mg/dL   Total Protein 7.5 6.5 - 8.1 g/dL   Albumin 3.8 3.5 - 5.0 g/dL   AST 140 (H) 15 - 41 U/L   ALT 87 (H) 17 - 63 U/L   Alkaline Phosphatase 87 38 - 126 U/L   Total Bilirubin 0.7 0.3 - 1.2 mg/dL   GFR calc non Af Amer >60 >60 mL/min   GFR calc Af Amer >60 >60 mL/min    Comment: (NOTE) The eGFR has been calculated using the CKD EPI equation. This calculation has not been validated in all clinical situations. eGFR's persistently <60 mL/min signify possible Chronic Kidney Disease.    Anion gap 12 5 - 15  Ethanol     Status: Abnormal   Collection Time: 12/21/16  3:00 PM  Result Value Ref Range   Alcohol, Ethyl (B) 204 (H) <5 mg/dL    Comment:        LOWEST DETECTABLE LIMIT FOR SERUM ALCOHOL IS 5 mg/dL FOR MEDICAL PURPOSES ONLY   Salicylate level     Status: None   Collection Time: 12/21/16  3:00 PM  Result Value Ref Range   Salicylate Lvl <5.1 2.8 - 30.0 mg/dL  Acetaminophen level     Status: Abnormal   Collection Time: 12/21/16  3:00 PM  Result Value Ref Range   Acetaminophen (Tylenol), Serum <10 (L) 10 - 30 ug/mL    Comment:        THERAPEUTIC CONCENTRATIONS VARY SIGNIFICANTLY. A RANGE OF 10-30 ug/mL MAY BE AN EFFECTIVE CONCENTRATION FOR MANY PATIENTS. HOWEVER, SOME ARE BEST TREATED AT CONCENTRATIONS OUTSIDE THIS RANGE. ACETAMINOPHEN CONCENTRATIONS >150 ug/mL AT 4 HOURS AFTER INGESTION AND >50 ug/mL AT 12 HOURS AFTER INGESTION ARE OFTEN ASSOCIATED WITH TOXIC REACTIONS.   cbc     Status:  Abnormal   Collection Time: 12/21/16  3:00 PM  Result Value Ref Range   WBC 4.9 4.0 - 10.5 K/uL   RBC 4.54 4.22 - 5.81 MIL/uL   Hemoglobin 14.8 13.0 - 17.0 g/dL   HCT 44.3 39.0 - 52.0 %   MCV 97.6 78.0 - 100.0 fL   MCH 32.6 26.0 - 34.0 pg   MCHC 33.4 30.0 - 36.0 g/dL   RDW 13.7 11.5 - 15.5 %   Platelets 106 (L) 150 - 400 K/uL    Comment: CONSISTENT WITH PREVIOUS RESULT    Blood Alcohol level:  Lab Results  Component Value Date   ETH 204 (H) 12/21/2016   ETH 261 (H) 02/58/5277    Metabolic Disorder Labs: Lab Results  Component Value Date   HGBA1C 5.2 09/18/2014   MPG 103 09/18/2014   MPG 105 03/02/2011   No results found for: PROLACTIN Lab Results  Component Value Date   CHOL 199 09/18/2014   TRIG 120 09/18/2014   HDL 94 09/18/2014   CHOLHDL 2.1 09/18/2014   VLDL 24 09/18/2014   LDLCALC 81 09/18/2014   LDLCALC 70 03/02/2011    Physical Findings: AIMS:  , ,  ,  ,    CIWA:  CIWA-Ar Total: 11 COWS:     Musculoskeletal: Strength & Muscle Tone: within normal limits Gait & Station: normal Patient leans: N/A  Psychiatric Specialty Exam: Physical Exam  Review of Systems  Constitutional: Positive for diaphoresis and malaise/fatigue. Negative for chills, fever and weight loss.  HENT: Negative.  Negative for congestion, hearing loss and sore throat.   Eyes: Negative.  Negative for blurred vision, double vision and photophobia.  Respiratory: Negative.  Negative for cough.   Cardiovascular: Negative.  Negative for chest pain and palpitations.  Gastrointestinal: Negative.  Negative for abdominal pain, constipation, diarrhea, heartburn, nausea and vomiting.  Genitourinary: Negative.   Musculoskeletal: Positive for myalgias. Negative for back pain and neck pain.  Skin: Negative.  Negative for rash.  Neurological: Positive for tremors. Negative for dizziness, tingling, seizures, loss of consciousness and headaches.  Endo/Heme/Allergies: Negative.  Negative for  environmental allergies.  Psychiatric/Behavioral: Positive for substance abuse. Negative for depression, hallucinations, memory loss and suicidal ideas. The patient is nervous/anxious. The patient does not have insomnia.     Blood pressure (!) 147/80, pulse 75, temperature 98.7 F (37.1 C), temperature source Oral, resp. rate 14, height 6' 2"  (1.88 m), weight 97.5 kg (215 lb), SpO2 95 %.Body mass index is 27.6 kg/m.  General Appearance: Disheveled  Eye Contact:  Minimal  Speech:  Clear and Coherent and Slow  Volume:  Decreased  Mood:  Dysphoric  Affect:  Congruent and Constricted  Thought Process:  Coherent and Descriptions of Associations: Intact  Orientation:  Full (Time, Place, and Person)  Thought Content:  WDL  Suicidal Thoughts:  No  Homicidal Thoughts:  No  Memory:  Immediate;   Fair Recent;   Fair Remote;   Fair  Judgement:  Poor  Insight:  Present  Psychomotor Activity:  Mannerisms and Restlessness  Concentration:  Concentration: Fair and Attention Span: Fair  Recall:  [employer] of Knowledge:  Fair  Language:  Fair  Akathisia:  No  Handed:  Right  AIMS (if indicated):     Assets:  Desire for Improvement  ADL's:  Impaired  Cognition:  WNL  Sleep:        Treatment Plan Summary: Daily contact with patient to assess and evaluate symptoms and progress in treatment and Medication management Patient currently in withdrawal, needs drug rehab. TTS to look for placement Hampton Abbot, MD 12/23/2016, 10:25 AM

## 2016-12-23 NOTE — BH Assessment (Addendum)
[place] Assessment Progress Note This Probation officer spoke with patient this date to re-evaluate and assess current treatment progress. Patient presents with a drowsy affect and speaks in a low soft voice as he interacts with this Probation officer. Patient is oriented to place only and is unsure of today's date/year. Patient denies active S/I, H/I or AVH but did admit to "having a bad drinking problem."Patient admits to ETOH use reporting that he consumes over 1/2 a gallon of liquor daily with last use on 12/20/16 when patient reported he drank a unknown amount. Patient stated "he drank until he passed out." Patient denies any thoughts of self harm this date although reports ongoing stressors to include: lack of a support network, fiances, and feelings of hoplessness  Patient also reported medication non-compliance (medical and psychiatric) in the past month. Patient denies having a current OP provider. Patient does report continued with withdrawals to include: tremors, agitation and nausea. Patient does not have a current psychiatrist or therapist. He was admitted to [facility] in the past ( 02/12/2016, 02/19/2015, 12/26/2014, 09/17/2014, 01/27/2014). Patient reports regular use of alcohol "for years" and also smokes THC occasionally. Case was sattfed with  Dwyane Dee MD who recommended patient be re-evaluated on 12/24/16 as residential treatment options are investigated.

## 2016-12-23 NOTE — BH Assessment (Signed)
[place] Assessment Progress Note  At 16:00this writer called [facility] to follow up on referral.  I was transferred to [facility], intake coordinator, and my call rolled to voice mail.  I left a message, but have received no response.  At 16:15 Leonia Reader, RN, [facility] calls to report that pt has been accepted to [facility] by Dr Dwyane Dee.  Pt has signed Consent for Admission, which has been faxed to [facility].  Pt's nurse, Shauna Hugh, has been notified, and agrees to call report to [phone] or [phone].  Pt is to be transported via Stacey Drain, [place] Triage Specialist [phone]

## 2016-12-23 NOTE — Progress Notes (Signed)
Patient ID: [name], male   DOB: 03-05-1966, 51 y.o.   MRN: [id number] Patient admitted to the unit due to active ETOH withdrawal and suicidal ideations.  Patient is currently able maintain safety in the presence of staff.  Safety search and skin assessment complete.  Patient was found to be free of contraband. Skin assessment revealed multiple tattoos and reddened lower extremities.  Patient in missing left index finger. Patient admitted to the unit without incident and is currently resting.

## 2016-12-24 DIAGNOSIS — F332 Major depressive disorder, recurrent severe without psychotic features: Secondary | ICD-10-CM

## 2016-12-24 MED ORDER — NICOTINE 21 MG/24HR TD PT24
21.0000 mg | MEDICATED_PATCH | Freq: Every day | TRANSDERMAL | Status: DC
  Administered 2016-12-24: 21 mg via TRANSDERMAL
  Filled 2016-12-24: qty 1

## 2016-12-24 NOTE — Progress Notes (Signed)
Pt has been resting in his chair/bed. Respirations are even and unlabored. Pt's blood pressure is low when standing.Reported to NP and was instructed to hold this dose. Will recheck later in day for next dose. Pt denies any withdrawal symptoms at this time. He denies si and hi. Encouraged pt to drink Gatorade. Offered support and observation. Safety maintained in the OBS unit.

## 2016-12-24 NOTE — H&P (Signed)
[facility] Provider Admission PAA/H&P  Patient Identification: [name] MRN:  [id number] Date of Evaluation:  12/24/2016 Chief Complaint:  MDD REC SEV Principal Diagnosis: MDD (major depressive disorder), recurrent severe, without psychosis ([place])  Diagnosis:   Patient Active Problem List   Diagnosis Date Noted  . MDD (major depressive disorder), recurrent severe, without psychosis ([place]) [F33.2] 12/23/2016  . Syncope and collapse [R55] 05/31/2016  . Low back pain [M54.5] 05/31/2016  . Chest pain [R07.9] 05/30/2016  . Left leg swelling [M79.89]   . COPD (chronic obstructive pulmonary disease) ([place]) [J44.9] 04/22/2016  . Acute respiratory failure with hypoxia ([place]) [J96.01] 02/09/2016  . Alcohol dependence with withdrawal, uncomplicated ([place]) [H37.169] 02/09/2015  . Suicidal ideations [R45.851] 02/09/2015  . GAD (generalized anxiety disorder) [F41.1] 09/18/2014  . Major depressive disorder, recurrent severe without psychotic features ([place]) [F33.2] 09/18/2014  . Alcohol abuse with alcohol-induced mood disorder ([place]) [F10.14] 09/17/2014  . Left leg DVT ([place]) [I82.402] 06/06/2014  . Upper leg DVT (deep venous thromboembolism), acute ([place]) [I82.4Y9] 06/12/2013  . Homeless single person [Z59.0] 06/12/2013  . 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 Lupus anticoagulant disorder ([place]) [D68.62] 02/02/2012  . Degenerative joint disease [M19.90]   . Tobacco abuse [Z72.0] 03/01/2011  . Pulmonary embolism ([place]) [I26.99] 06/07/2010   History of Present Illness:  Patient is oriented to place only and is unsure of today's date/year. Patient denies activeS/I, H/I or AVH but did admit to "having a bad drinking problem."Patient admits to ETOH use reporting that he consumes over 1/2 a gallon of liquor daily with last use on 12/22/16 when patient reported he drank a unknown amount. Patient stated "he drank until he passed out." Patient denies any thoughts of self harm this date although  reports ongoing stressors to include: lack of a support network, fiances, and feelings of hoplessness  Principal Problem: Major depressive disorder, recurrent severe without psychotic features ([place])  Associated Signs/Symptoms: Depression Symptoms:  depressed mood, anhedonia, fatigue, feelings of worthlessness/guilt, difficulty concentrating, hopelessness, loss of energy/fatigue, disturbed sleep, (Hypo) Manic Symptoms:  not present Anxiety Symptoms:  Excessive Worry, Psychotic Symptoms:  none PTSD Symptoms: NA Total Time spent with patient: 20 minutes  Past Psychiatric History: alcohol dependence  Is the patient at risk to self? No.  Has the patient been a risk to self in the past 6 months? No.  Has the patient been a risk to self within the distant past? No.  Is the patient a risk to others? No.  Has the patient been a risk to others in the past 6 months? No.  Has the patient been a risk to others within the distant past? No.   Prior Inpatient Therapy:  No Prior Outpatient Therapy:  No  Alcohol Screening:   Substance Abuse History in the last 12 months:  Yes.   Consequences of Substance Abuse: Withdrawal Symptoms:   Diaphoresis Headaches Tremors  Homeless, financial concerns Previous Psychotropic Medications: No  Psychological Evaluations: No  Past Medical History:  Past Medical History:  Diagnosis Date  . Collagen vascular disease ([place])   . COPD (chronic obstructive pulmonary disease) ([place])   . Coronary artery disease   . Degenerative joint disease   . Degenerative joint disease of spine   . Depression   . DVT (deep venous thrombosis) ([place])   . ETOH abuse   . Hepatitis B   . Lupus anticoagulant disorder ([place]) 02/02/2012  . Pulmonary embolism ([place]) 06/2010    Past Surgical History:  Procedure Laterality Date  .  LEFT ELBOW SURGERY    . TONSILLECTOMY     Family History:  Family History  Problem Relation Age of Onset  . Cancer Mother     Ovarian   Family  Psychiatric History: Unknown Tobacco Screening:   Social History:  History  Alcohol Use  . 14.4 oz/week  . 24 Cans of beer per week    Comment: everyday- 24 cans     History  Drug Use No    Additional Social History:           Allergies:  No Known Allergies Lab Results: No results found for this or any previous visit (from the past 48 hour(s)).  Blood Alcohol level:  Lab Results  Component Value Date   ETH 204 (H) 12/21/2016   ETH 261 (H) 00/93/8182    Metabolic Disorder Labs:  Lab Results  Component Value Date   HGBA1C 5.2 09/18/2014   MPG 103 09/18/2014   MPG 105 03/02/2011   No results found for: PROLACTIN Lab Results  Component Value Date   CHOL 199 09/18/2014   TRIG 120 09/18/2014   HDL 94 09/18/2014   CHOLHDL 2.1 09/18/2014   VLDL 24 09/18/2014   LDLCALC 81 09/18/2014   LDLCALC 70 03/02/2011    Current Medications: Current Facility-Administered Medications  Medication Dose Route Frequency Provider Last Rate Last Dose  . acetaminophen (TYLENOL) tablet 650 mg  650 mg Oral Q6H PRN Ethelene Hal, NP      . alum & mag hydroxide-simeth (MAALOX/MYLANTA) 200-200-20 MG/5ML suspension 30 mL  30 mL Oral Q4H PRN Ethelene Hal, NP      . carvedilol (COREG) tablet 3.125 mg  3.125 mg Oral BID WC Ethelene Hal, NP      . chlordiazePOXIDE (LIBRIUM) capsule 25 mg  25 mg Oral Q6H PRN Ethelene Hal, NP      . chlordiazePOXIDE (LIBRIUM) capsule 25 mg  25 mg Oral QID Ethelene Hal, NP   25 mg at 12/24/16 0723   Followed by  . [START ON 12/25/2016] chlordiazePOXIDE (LIBRIUM) capsule 25 mg  25 mg Oral TID Ethelene Hal, NP       Followed by  . [START ON 12/26/2016] chlordiazePOXIDE (LIBRIUM) capsule 25 mg  25 mg Oral BH-qamhs Ethelene Hal, NP       Followed by  . [START ON 12/27/2016] chlordiazePOXIDE (LIBRIUM) capsule 25 mg  25 mg Oral Daily Ethelene Hal, NP      . citalopram (CELEXA) tablet 10 mg  10 mg Oral Daily  Ethelene Hal, NP   10 mg at 12/24/16 9937  . furosemide (LASIX) tablet 20 mg  20 mg Oral Daily Ethelene Hal, NP   20 mg at 12/24/16 1696  . hydrOXYzine (ATARAX/VISTARIL) tablet 25 mg  25 mg Oral Q6H PRN Ethelene Hal, NP      . hydrOXYzine (ATARAX/VISTARIL) tablet 25 mg  25 mg Oral TID PRN Ethelene Hal, NP      . loperamide (IMODIUM) capsule 2-4 mg  2-4 mg Oral PRN Ethelene Hal, NP      . magnesium hydroxide (MILK OF MAGNESIA) suspension 30 mL  30 mL Oral Daily PRN Ethelene Hal, NP      . mometasone-formoterol [facility]) 100-5 MCG/ACT inhaler 2 puff  2 puff Inhalation BID Ethelene Hal, NP   2 puff at 12/24/16 0859  . multivitamin with minerals tablet 1 tablet  1 tablet Oral Daily Ethelene Hal, NP  1 tablet at 12/24/16 0724  . nicotine (NICODERM CQ - dosed in mg/24 hours) patch 21 mg  21 mg Transdermal Daily Benjamine Mola, FNP   21 mg at 12/24/16 0858  . ondansetron (ZOFRAN-ODT) disintegrating tablet 4 mg  4 mg Oral Q6H PRN Ethelene Hal, NP      . rivaroxaban [facility]) tablet 20 mg  20 mg Oral Q breakfast Ethelene Hal, NP   20 mg at 12/24/16 0859  . thiamine (VITAMIN B-1) tablet 100 mg  100 mg Oral Daily Ethelene Hal, NP   100 mg at 12/24/16 0724  . tiotropium (SPIRIVA) inhalation capsule 18 mcg  18 mcg Inhalation Daily Ethelene Hal, NP   18 mcg at 12/24/16 0859  . traZODone (DESYREL) tablet 50 mg  50 mg Oral QHS PRN Ethelene Hal, NP   50 mg at 12/23/16 2137   PTA Medications: Prescriptions Prior to Admission  Medication Sig Dispense Refill Last Dose  . albuterol (PROVENTIL HFA;VENTOLIN HFA) 108 (90 Base) MCG/ACT inhaler Inhale 2 puffs into the lungs every 6 (six) hours as needed for wheezing or shortness of breath. 1 Inhaler 2 unknown  . Aspirin-Salicylamide-Caffeine (BC HEADACHE POWDER PO) Take 2 packets by mouth daily.   12/20/2016 at Unknown time  . carvedilol (COREG) 3.125 MG tablet Take 1  tablet (3.125 mg total) by mouth 2 (two) times daily with a meal. For high blood pressure 60 tablet 3 Past Month at Unknown time  . cephALEXin (KEFLEX) 500 MG capsule Take 1 capsule (500 mg total) by mouth 2 (two) times daily. (Patient not taking: Reported on 12/21/2016) 20 capsule 0 Not Taking at Unknown time  . citalopram (CELEXA) 40 MG tablet Take 1 tablet (40 mg total) by mouth daily. 30 tablet 3 Past Month at Unknown time  . furosemide (LASIX) 20 MG tablet Take 1 tablet (20 mg total) by mouth daily. 30 tablet 3 Past Month at Unknown time  . hydrOXYzine (ATARAX/VISTARIL) 25 MG tablet Take 1 tablet (25 mg total) by mouth 3 (three) times daily as needed for anxiety. 60 tablet 3 Past Month at Unknown time  . mometasone-formoterol (DULERA) 100-5 MCG/ACT AERO Inhale 2 puffs into the lungs 2 (two) times daily. 1 Inhaler 3 12/21/2016 at Unknown time  . predniSONE (DELTASONE) 20 MG tablet Take 2 tablets (40 mg total) by mouth daily. (Patient not taking: Reported on 12/21/2016) 8 tablet 0 Not Taking at Unknown time  . rivaroxaban (XARELTO) 20 MG TABS tablet Take 1 tablet (20 mg total) by mouth daily with breakfast. 30 tablet 3 Past Month at Unknown time  . tiotropium (SPIRIVA HANDIHALER) 18 MCG inhalation capsule Place 1 capsule (18 mcg total) into inhaler and inhale daily. 30 capsule 3 Past Month at Unknown time    Musculoskeletal: Strength & Muscle Tone: within normal limits Gait & Station: normal Patient leans: N/A  Psychiatric Specialty Exam: Physical Exam  Constitutional: He appears well-developed.  HENT:  Head: Normocephalic.  Right Ear: External ear normal.  Left Ear: External ear normal.  Musculoskeletal: Normal range of motion.  Skin: Skin is warm and dry.    Review of Systems  Psychiatric/Behavioral: Positive for depression and substance abuse. Negative for hallucinations and memory loss. The patient is not nervous/anxious and does not have insomnia.   All other systems reviewed and are  negative.   Blood pressure (!) 83/45, pulse (!) 110, temperature 98.4 F (36.9 C), temperature source Oral, resp. rate 18, height 6' 0.75" (1.848 m), weight  92.1 kg (203 lb).Body mass index is 26.97 kg/m.  General Appearance: Disheveled  Eye Contact:  Fair  Speech:  Clear and Coherent and Slow  Volume:  Decreased  Mood:  Dysphoric  Affect:  Congruent and Constricted  Thought Process:  Coherent and Descriptions of Associations: Intact  Orientation:  Full (Time, Place, and Person)  Thought Content:  WDL  Suicidal Thoughts:  No  Homicidal Thoughts:  No  Memory:  Immediate;   Good Recent;   Good Remote;   Fair  Judgement:  Poor  Insight:  Present  Psychomotor Activity:  Decreased  Concentration:  Concentration: Fair and Attention Span: Fair  Recall:  [employer] of Knowledge:  Fair  Language:  Fair  Akathisia:  No  Handed:  Right  AIMS (if indicated):     Assets:  Desire for Improvement  ADL's:  Impaired  Cognition:  WNL  Sleep:         Treatment Plan Summary: Daily contact with patient to assess and evaluate symptoms and progress in treatment and Medication management  Observation Level/Precautions:  Continuous Observation Laboratory:  CBC Chemistry Profile UDS UA Medications:  See [facility] Discharge Concerns:  Homelessness, access to substance abuse rehab, no insurance for services Estimated LOS:24 hours       Ethelene Hal, NP 4/19/201810:22 AM

## 2016-12-24 NOTE — Discharge Summary (Signed)
Physician Discharge Summary Note  Patient:  [name] is an 51 y.o., male MRN:  [id number] DOB:  12/03/1965 Patient phone:  [phone] (home)  Patient address:   [place] [address],  Total Time spent with patient: 15 minutes  Date of Admission:  12/23/2016 Date of Discharge: 12/23/2016  Reason for Admission:  ETOH Abuse  Principal Problem: MDD (major depressive disorder), recurrent severe, without psychosis [facility]) Discharge Diagnoses: Patient Active Problem List   Diagnosis Date Noted  . MDD (major depressive disorder), recurrent severe, without psychosis ([place]) [F33.2] 12/23/2016  . Syncope and collapse [R55] 05/31/2016  . Low back pain [M54.5] 05/31/2016  . Chest pain [R07.9] 05/30/2016  . Left leg swelling [M79.89]   . COPD (chronic obstructive pulmonary disease) ([place]) [J44.9] 04/22/2016  . Acute respiratory failure with hypoxia ([place]) [J96.01] 02/09/2016  . Alcohol dependence with withdrawal, uncomplicated ([place]) [K53.976] 02/09/2015  . Suicidal ideations [R45.851] 02/09/2015  . GAD (generalized anxiety disorder) [F41.1] 09/18/2014  . Major depressive disorder, recurrent severe without psychotic features ([place]) [F33.2] 09/18/2014  . Alcohol abuse with alcohol-induced mood disorder ([place]) [F10.14] 09/17/2014  . Left leg DVT ([place]) [I82.402] 06/06/2014  . Upper leg DVT (deep venous thromboembolism), acute ([place]) [I82.4Y9] 06/12/2013  . Homeless single person [Z59.0] 06/12/2013  . 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 Lupus anticoagulant disorder ([place]) [D68.62] 02/02/2012  . Degenerative joint disease [M19.90]   . Tobacco abuse [Z72.0] 03/01/2011  . Pulmonary embolism ([place]) [I26.99] 06/07/2010    Past Psychiatric History: Alcohol abuse, depression  Past Medical History:  Past Medical History:  Diagnosis Date  . Collagen vascular disease ([place])   . COPD (chronic obstructive pulmonary disease) ([place])   . Coronary artery disease   . Degenerative joint disease    . Degenerative joint disease of spine   . Depression   . DVT (deep venous thrombosis) ([place])   . ETOH abuse   . Hepatitis B   . Lupus anticoagulant disorder ([place]) 02/02/2012  . Pulmonary embolism ([place]) 06/2010    Past Surgical History:  Procedure Laterality Date  . LEFT ELBOW SURGERY    . TONSILLECTOMY     Family History:  Family History  Problem Relation Age of Onset  . Cancer Mother     Ovarian   Family Psychiatric  History: Unknown Social History:  History  Alcohol Use  . 14.4 oz/week  . 24 Cans of beer per week    Comment: everyday- 24 cans     History  Drug Use No    Social History   Social History  . Marital status: Legally Separated    Spouse name: N/A  . Number of children: N/A  . Years of education: N/A   Occupational History  . Homeless   .  Not Employed   Social History Main Topics  . Smoking status: Current Every Day Smoker    Packs/day: 2.00    Years: 30.00    Types: Cigarettes  . Smokeless tobacco: Never Used  . Alcohol use 14.4 oz/week    24 Cans of beer per week     Comment: everyday- 24 cans  . Drug use: No  . Sexual activity: No   Other Topics Concern  . None   Social History Narrative   Estranged from family.  Homeless.      Hospital Course:  [name] is a 51 year old male who spent the night in [facility] Observation unit without incident. He was placed on a CIWA protocol to help with withdrawal  symptoms and spent most of the time resting and sleeping.  Pt denies suicidal/homicidal ideation, denies auditory/visual hallucinations and does not appear to be responding to internal stimuli. Pt was calm and cooperative, alert & oriented x 3, dressed in paper scrubs, eating lunch.  Pt stated he wants to try and get into a long term treatment program for his alcohol addiction. Pt was not a candidate for [facility] and is being referred to outpatient resources through [facility]  [name] desire to stop drinking is his motivation to seek  addiction treatment.    Physical Findings: AIMS: Facial and Oral Movements Muscles of Facial Expression: None, normal Lips and Perioral Area: None, normal Jaw: None, normal Tongue: None, normal,Extremity Movements Upper (arms, wrists, hands, fingers): None, normal Lower (legs, knees, ankles, toes): None, normal, Trunk Movements Neck, shoulders, hips: None, normal, Overall Severity Severity of abnormal movements (highest score from questions above): None, normal Incapacitation due to abnormal movements: None, normal Patient's awareness of abnormal movements (rate only patient's report): No Awareness, Dental Status Current problems with teeth and/or dentures?: Yes Does patient usually wear dentures?: No  CIWA:  CIWA-Ar Total: 0 COWS:  COWS Total Score: 1  Musculoskeletal: Strength & Muscle Tone: within normal limits Gait & Station: normal Patient leans: N/A  Psychiatric Specialty Exam: Physical Exam  Review of Systems  Psychiatric/Behavioral: Positive for depression and substance abuse. Negative for hallucinations, memory loss and suicidal ideas. The patient is not nervous/anxious and does not have insomnia.   All other systems reviewed and are negative.   Blood pressure (!) 75/55, pulse (!) 110, temperature 97.8 F (36.6 C), temperature source Oral, resp. rate 16, height 6' 0.75" (1.848 m), weight 92.1 kg (203 lb), SpO2 95 %.Body mass index is 26.97 kg/m.  General Appearance: Casual  Eye Contact:  Good  Speech:  Clear and Coherent and Slow  Volume:  Normal  Mood:  Dysphoric  Affect:  Congruent, Depressed and Restricted  Thought Process:  Coherent and Linear  Orientation:  Full (Time, Place, and Person)  Thought Content:  Logical  Suicidal Thoughts:  No  Homicidal Thoughts:  No  Memory:  Immediate;   Fair Recent;   Fair Remote;   Fair  Judgement:  Fair  Insight:  Present  Psychomotor Activity:  Normal  Concentration:  Concentration: Good and Attention Span: Good   Recall:  Good  Fund of Knowledge:  Good  Language:  Good  Akathisia:  No  Handed:  Right  AIMS (if indicated):     Assets:  Desire for Improvement  ADL's:  Intact  Cognition:  WNL  Sleep:           Has this patient used any form of tobacco in the last 30 days? (Cigarettes, Smokeless Tobacco, Cigars, and/or Pipes) Yes, No  Blood Alcohol level:  Lab Results  Component Value Date   ETH 204 (H) 12/21/2016   ETH 261 (H) 40/81/4481    Metabolic Disorder Labs:  Lab Results  Component Value Date   HGBA1C 5.2 09/18/2014   MPG 103 09/18/2014   MPG 105 03/02/2011   No results found for: PROLACTIN Lab Results  Component Value Date   CHOL 199 09/18/2014   TRIG 120 09/18/2014   HDL 94 09/18/2014   CHOLHDL 2.1 09/18/2014   VLDL 24 09/18/2014   LDLCALC 81 09/18/2014   LDLCALC 70 03/02/2011    See Psychiatric Specialty Exam and Suicide Risk Assessment completed by Attending Physician prior to discharge.  Discharge destination:  Home  Is patient on multiple antipsychotic therapies at discharge:  No   Has Patient had three or more failed trials of antipsychotic monotherapy by history:  No  Recommended Plan for Multiple Antipsychotic Therapies: NA   Allergies as of 12/24/2016   No Known Allergies     Medication List    STOP taking these medications   BC HEADACHE POWDER PO   cephALEXin 500 MG capsule Commonly known as:  KEFLEX   predniSONE 20 MG tablet Commonly known as:  DELTASONE     TAKE these medications     Indication  albuterol 108 (90 Base) MCG/ACT inhaler Commonly known as:  PROVENTIL HFA;VENTOLIN HFA Inhale 2 puffs into the lungs every 6 (six) hours as needed for wheezing or shortness of breath.  Indication:  Asthma   carvedilol 3.125 MG tablet Commonly known as:  COREG Take 1 tablet (3.125 mg total) by mouth 2 (two) times daily with a meal. For high blood pressure  Indication:  High Blood Pressure of Unknown Cause   citalopram 40 MG  tablet Commonly known as:  CELEXA Take 1 tablet (40 mg total) by mouth daily.  Indication:  Depression   furosemide 20 MG tablet Commonly known as:  LASIX Take 1 tablet (20 mg total) by mouth daily.  Indication:  High Blood Pressure Disorder   hydrOXYzine 25 MG tablet Commonly known as:  ATARAX/VISTARIL Take 1 tablet (25 mg total) by mouth 3 (three) times daily as needed for anxiety.  Indication:  Anxiety Neurosis   mometasone-formoterol 100-5 MCG/ACT Aero Commonly known as:  DULERA Inhale 2 puffs into the lungs 2 (two) times daily.  Indication:  Asthma   rivaroxaban 20 MG Tabs tablet Commonly known as:  XARELTO Take 1 tablet (20 mg total) by mouth daily with breakfast.  Indication:  Blood Clot in a Deep Vein   tiotropium 18 MCG inhalation capsule Commonly known as:  SPIRIVA HANDIHALER Place 1 capsule (18 mcg total) into inhaler and inhale daily.  Indication:  Chronic Obstructive Lung Disease        Follow-up recommendations:  Activity:  as tolerated Diet:  Balanced Other:  follow up with [facility] for outpatient therapy and medication management  Comments:  Follow up with [facility] for outpatient resources Follow up with [facility] for any new or existing health concerns  Signed: Ethelene Hal, NP 12/24/2016, 12:11 PM

## 2016-12-24 NOTE — BHH Counselor (Signed)
This Probation officer followed up with [facility] in regards to pending referral for this pt and was informed that pt was denied due to being medically acute.

## 2016-12-24 NOTE — Progress Notes (Signed)
Pt's blood pressure is running low and it was reported to NP. Morning blood pressure medication held. Encouraged to stand up slowly and fall safety. Encouraged to increase fluids. Safety maintained.

## 2016-12-24 NOTE — Discharge Instructions (Signed)
Follow up with [facility] 

## 2016-12-24 NOTE — BHH Counselor (Signed)
Pt does not meet criteria for inpatient treatment and currently denies SI/HI. Pt will be discharged with OPT resources and is expected to report to [facility] to address his mental health and substance abuse symptoms. Pt will be provided with a bus pass and was informed that [facility]'s walk in clinic hours are M-F from 8am-3pm.

## 2016-12-24 NOTE — Progress Notes (Signed)
Pt d/c from the observation unit. All items returned. D/C instructions given and bus passes given. Pt denies si and hi.

## 2017-01-29 ENCOUNTER — Telehealth: Payer: Self-pay | Admitting: Family Medicine

## 2017-01-29 DIAGNOSIS — M7989 Other specified soft tissue disorders: Secondary | ICD-10-CM

## 2017-01-29 DIAGNOSIS — J441 Chronic obstructive pulmonary disease with (acute) exacerbation: Secondary | ICD-10-CM

## 2017-01-29 DIAGNOSIS — D6862 Lupus anticoagulant syndrome: Secondary | ICD-10-CM

## 2017-01-29 DIAGNOSIS — F332 Major depressive disorder, recurrent severe without psychotic features: Secondary | ICD-10-CM

## 2017-01-29 DIAGNOSIS — I82592 Chronic embolism and thrombosis of other specified deep vein of left lower extremity: Secondary | ICD-10-CM

## 2017-01-29 MED ORDER — HYDROXYZINE HCL 25 MG PO TABS: 25.0000 mg | ORAL_TABLET | Freq: Three times a day (TID) | ORAL | 0 refills | Status: DC | PRN

## 2017-01-29 MED ORDER — MOMETASONE FURO-FORMOTEROL FUM 100-5 MCG/ACT IN AERO: 2.0000 | INHALATION_SPRAY | Freq: Two times a day (BID) | RESPIRATORY_TRACT | 0 refills | Status: DC

## 2017-01-29 MED ORDER — RIVAROXABAN 20 MG PO TABS: 20.0000 mg | ORAL_TABLET | Freq: Every day | ORAL | 0 refills | Status: DC

## 2017-01-29 MED ORDER — CITALOPRAM HYDROBROMIDE 40 MG PO TABS: 40.0000 mg | ORAL_TABLET | Freq: Every day | ORAL | 0 refills | Status: DC

## 2017-01-29 MED ORDER — TIOTROPIUM BROMIDE MONOHYDRATE 18 MCG IN CAPS: 18.0000 ug | ORAL_CAPSULE | Freq: Every day | RESPIRATORY_TRACT | 0 refills | Status: DC

## 2017-01-29 MED ORDER — CARVEDILOL 3.125 MG PO TABS: 3.1250 mg | ORAL_TABLET | Freq: Two times a day (BID) | ORAL | 0 refills | Status: DC

## 2017-01-29 MED ORDER — FUROSEMIDE 20 MG PO TABS: 20.0000 mg | ORAL_TABLET | Freq: Every day | ORAL | 0 refills | Status: DC

## 2017-01-29 MED FILL — SPIRIVA 18 MCG CP-HANDIHALE: 18 | 30 days supply | Qty: 30 | Fill #0

## 2017-01-29 NOTE — Telephone Encounter (Signed)
Patient called the office to request medication refill (one month supply) on all his prescriptions. Please send them to our pharmacy.  Thank you.

## 2017-01-29 NOTE — Telephone Encounter (Signed)
Chronic medications refilled x 30 days, patient needs office visit for further refills.

## 2017-02-02 MED FILL — **XARELTO 20 MG TABLET: 20 MG | 7 days supply | Qty: 7 | Fill #0

## 2017-02-02 MED FILL — DULERA 100 MCG/5 MCG INH: 100-5 | 30 days supply | Qty: 13 | Fill #0

## 2017-02-16 ENCOUNTER — Ambulatory Visit: Payer: Self-pay | Attending: Family Medicine | Admitting: Family Medicine

## 2017-02-16 ENCOUNTER — Encounter: Payer: Self-pay | Admitting: Family Medicine

## 2017-02-16 VITALS — BP 100/64 | HR 99 | Temp 98.0°F | Resp 18 | Ht 74.0 in | Wt 217.0 lb

## 2017-02-16 DIAGNOSIS — Z86718 Personal history of other venous thrombosis and embolism: Secondary | ICD-10-CM | POA: Insufficient documentation

## 2017-02-16 DIAGNOSIS — M7989 Other specified soft tissue disorders: Secondary | ICD-10-CM | POA: Insufficient documentation

## 2017-02-16 DIAGNOSIS — I2510 Atherosclerotic heart disease of native coronary artery without angina pectoris: Secondary | ICD-10-CM | POA: Insufficient documentation

## 2017-02-16 DIAGNOSIS — J441 Chronic obstructive pulmonary disease with (acute) exacerbation: Secondary | ICD-10-CM | POA: Insufficient documentation

## 2017-02-16 DIAGNOSIS — Z9114 Patient's other noncompliance with medication regimen: Secondary | ICD-10-CM | POA: Insufficient documentation

## 2017-02-16 DIAGNOSIS — I82502 Chronic embolism and thrombosis of unspecified deep veins of left lower extremity: Secondary | ICD-10-CM | POA: Insufficient documentation

## 2017-02-16 DIAGNOSIS — F3112 Bipolar disorder, current episode manic without psychotic features, moderate: Secondary | ICD-10-CM | POA: Insufficient documentation

## 2017-02-16 DIAGNOSIS — M47899 Other spondylosis, site unspecified: Secondary | ICD-10-CM | POA: Insufficient documentation

## 2017-02-16 DIAGNOSIS — I82592 Chronic embolism and thrombosis of other specified deep vein of left lower extremity: Secondary | ICD-10-CM

## 2017-02-16 DIAGNOSIS — D6862 Lupus anticoagulant syndrome: Secondary | ICD-10-CM | POA: Insufficient documentation

## 2017-02-16 DIAGNOSIS — F419 Anxiety disorder, unspecified: Secondary | ICD-10-CM | POA: Insufficient documentation

## 2017-02-16 DIAGNOSIS — R05 Cough: Secondary | ICD-10-CM | POA: Insufficient documentation

## 2017-02-16 DIAGNOSIS — Z590 Homelessness: Secondary | ICD-10-CM | POA: Insufficient documentation

## 2017-02-16 DIAGNOSIS — Z86711 Personal history of pulmonary embolism: Secondary | ICD-10-CM | POA: Insufficient documentation

## 2017-02-16 DIAGNOSIS — Z7901 Long term (current) use of anticoagulants: Secondary | ICD-10-CM | POA: Insufficient documentation

## 2017-02-16 MED ORDER — CITALOPRAM HYDROBROMIDE 40 MG PO TABS: 40.0000 mg | ORAL_TABLET | Freq: Every day | ORAL | 6 refills | Status: DC

## 2017-02-16 MED ORDER — ALBUTEROL SULFATE HFA 108 (90 BASE) MCG/ACT IN AERS: 2.0000 | INHALATION_SPRAY | Freq: Four times a day (QID) | RESPIRATORY_TRACT | 6 refills | Status: DC | PRN

## 2017-02-16 MED ORDER — RIVAROXABAN 20 MG PO TABS: 20.0000 mg | ORAL_TABLET | Freq: Every day | ORAL | 6 refills | Status: DC

## 2017-02-16 MED ORDER — TIOTROPIUM BROMIDE MONOHYDRATE 18 MCG IN CAPS: 18.0000 ug | ORAL_CAPSULE | Freq: Every day | RESPIRATORY_TRACT | 6 refills | Status: DC

## 2017-02-16 MED ORDER — CYCLOBENZAPRINE HCL 10 MG PO TABS: 10.0000 mg | ORAL_TABLET | Freq: Two times a day (BID) | ORAL | 2 refills | Status: AC | PRN

## 2017-02-16 MED ORDER — FUROSEMIDE 20 MG PO TABS: 20.0000 mg | ORAL_TABLET | Freq: Every day | ORAL | 6 refills | Status: AC

## 2017-02-16 MED ORDER — MOMETASONE FURO-FORMOTEROL FUM 100-5 MCG/ACT IN AERO: 2.0000 | INHALATION_SPRAY | Freq: Two times a day (BID) | RESPIRATORY_TRACT | 6 refills | Status: AC

## 2017-02-16 MED ORDER — HYDROXYZINE HCL 25 MG PO TABS: 25.0000 mg | ORAL_TABLET | Freq: Three times a day (TID) | ORAL | 6 refills | Status: DC | PRN

## 2017-02-16 MED FILL — ?HYDROXYZINE HCL 25 MG TAB: 25 MG | 20 days supply | Qty: 60 | Fill #0

## 2017-02-16 MED FILL — ?CITALOPRAM HBR 40 MG TABLE: 40 | 30 days supply | Qty: 30 | Fill #0

## 2017-02-16 MED FILL — FUROSEMIDE 20 MG TABLET: 20 | 30 days supply | Qty: 30 | Fill #0

## 2017-02-16 NOTE — Progress Notes (Signed)
Subjective:  Patient ID: [name], male    DOB: 1966/07/26  Age: 51 y.o. MRN: [id number]  CC: Medication Refill and DVT   HPI [name] is a 51 year old with a history of COPD, hypercoagulable disorder, depression and anxiety, homelessness chronic left lower extremity DVT, previous history of PE (status post IVC filter, noncompliant with anticoagulation )who presents today For a follow-up visit accompanied by his caseworker.  He had been referred from the clinic for inpatient hospitalization due to an active suicide plan. On discharge recommendation was to follow-up with [facility] which he never did.  He has chronic left lower extremity swelling, pain and erythema but denies fever. He denies shortness of breath or chest pains. He has a chronic cough and endorses noncompliance with his medications because he has "no transportation to come get his medication".  During the course of the encounter he gets manic and starts yelling but later calms down.    Past Medical History:  Diagnosis Date  . Collagen vascular disease ([place])   . COPD (chronic obstructive pulmonary disease) ([place])   . Coronary artery disease   . Degenerative joint disease   . Degenerative joint disease of spine   . Depression   . DVT (deep venous thrombosis) ([place])   . ETOH abuse   . Hepatitis B   . Lupus anticoagulant disorder ([place]) 02/02/2012  . Pulmonary embolism ([place]) 06/2010    Past Surgical History:  Procedure Laterality Date  . LEFT ELBOW SURGERY    . TONSILLECTOMY      No Known Allergies   Outpatient Medications Prior to Visit  Medication Sig Dispense Refill  . albuterol (PROVENTIL HFA;VENTOLIN HFA) 108 (90 Base) MCG/ACT inhaler Inhale 2 puffs into the lungs every 6 (six) hours as needed for wheezing or shortness of breath. 1 Inhaler 2  . carvedilol (COREG) 3.125 MG tablet Take 1 tablet (3.125 mg total) by mouth 2 (two) times daily with a meal. For high blood pressure 60 tablet 0  . citalopram  (CELEXA) 40 MG tablet Take 1 tablet (40 mg total) by mouth daily. 30 tablet 0  . furosemide (LASIX) 20 MG tablet Take 1 tablet (20 mg total) by mouth daily. 30 tablet 0  . hydrOXYzine (ATARAX/VISTARIL) 25 MG tablet Take 1 tablet (25 mg total) by mouth 3 (three) times daily as needed for anxiety. 60 tablet 0  . mometasone-formoterol (DULERA) 100-5 MCG/ACT AERO Inhale 2 puffs into the lungs 2 (two) times daily. 1 Inhaler 0  . rivaroxaban (XARELTO) 20 MG TABS tablet Take 1 tablet (20 mg total) by mouth daily with breakfast. 30 tablet 0  . tiotropium (SPIRIVA HANDIHALER) 18 MCG inhalation capsule Place 1 capsule (18 mcg total) into inhaler and inhale daily. 30 capsule 0   No facility-administered medications prior to visit.     ROS Review of Systems Constitutional: Negative for activity change and appetite change.  HENT: Negative for sinus pressure and sore throat.   Eyes: Negative for visual disturbance.  Respiratory: Positive for cough Negative for chest tightness or shortness of breath.   Cardiovascular: Positive for leg swelling. Negative for chest pain.  Gastrointestinal: Negative for abdominal distention, abdominal pain, constipation and diarrhea.  Endocrine: Negative.   Genitourinary: Negative for dysuria.  Musculoskeletal: Negative for arthralgias.  Skin: Negative for rash.  Allergic/Immunologic: Negative.   Neurological: Negative for weakness, light-headedness and numbness.  Psychiatric/Behavioral: Positive for mania, no suicidal ideation today  Objective:  BP 100/64 (BP Location: Left Arm, Patient Position: Sitting, Cuff Size:  Normal)   Pulse 99   Temp 98 F (36.7 C) (Oral)   Resp 18   Ht 6\' 2"  (1.88 m)   Wt 217 lb (98.4 kg)   SpO2 95%   BMI 27.86 kg/m   BP/Weight 02/16/2017 12/23/2016 10/03/5168  Systolic BP 017 494 -  Diastolic BP 64 76 -  Wt. (Lbs) 217 - 215  BMI 27.86 - 27.6  Some encounter information is confidential and restricted. Go to Review Flowsheets activity  to see all data.      Physical Exam Constitutional: He is oriented to person, place, and time. He appears well-developed and well-nourished.  Cardiovascular: Normal rate, normal heart sounds and intact distal pulses.   No murmur heard. Pulmonary/Chest: Effort normal and breath sounds normal. Intermittent wheezing in bilateral lung fields Abdominal: Soft. Bowel sounds are normal. He exhibits no distension and no mass. There is no tenderness.  Musculoskeletal:  Nonpitting edema of left leg to the knee with associated erythema of shin, stasis dermatitis  Right leg is normal.  Neurological: He is alert and oriented to person, place, and time.  Psych: Manic  Assessment & Plan:   1. COPD exacerbation ([place]) Uncontrolled due to noncompliance - tiotropium (SPIRIVA HANDIHALER) 18 MCG inhalation capsule; Place 1 capsule (18 mcg total) into inhaler and inhale daily.  Dispense: 30 capsule; Refill: 6 - albuterol (PROVENTIL HFA;VENTOLIN HFA) 108 (90 Base) MCG/ACT inhaler; Inhale 2 puffs into the lungs every 6 (six) hours as needed for wheezing or shortness of breath.  Dispense: 1 Inhaler; Refill: 6  2. Left leg swelling - furosemide (LASIX) 20 MG tablet; Take 1 tablet (20 mg total) by mouth daily.  Dispense: 30 tablet; Refill: 6  3. 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  Dispense: 30 tablet; Refill: 6  4. Lupus anticoagulant disorder ([place]) Will need to remain on anticoagulation for life - rivaroxaban (XARELTO) 20 MG TABS tablet; Take 1 tablet (20 mg total) by mouth daily with breakfast.  Dispense: 30 tablet; Refill: 6  5. Bipolar 1 disorder, manic, moderate ([place]) He needs to be seen by psychiatry for optimization of management however he has been  noncompliant - hydrOXYzine (ATARAX/VISTARIL) 25 MG tablet; Take 1 tablet (25 mg total) by mouth 3 (three) times daily as needed for anxiety.  Dispense: 60 tablet; Refill: 6 - citalopram (CELEXA) 40 MG tablet; Take 1 tablet (40 mg total) by mouth daily.  Dispense: 30 tablet; Refill: 6  I have discontinued carvedilol due to the fact that he has been hypotensive at all his visits despite not taking carvedilol.  Meds ordered this encounter  Medications  . tiotropium (SPIRIVA HANDIHALER) 18 MCG inhalation capsule    Sig: Place 1 capsule (18 mcg total) into inhaler and inhale daily.    Dispense:  30 capsule    Refill:  6    Must have office visit for refills  . mometasone-formoterol (DULERA) 100-5 MCG/ACT AERO    Sig: Inhale 2 puffs into the lungs 2 (two) times daily.    Dispense:  1 Inhaler    Refill:  6    Must have office visit for refills  . albuterol (PROVENTIL HFA;VENTOLIN HFA) 108 (90 Base) MCG/ACT inhaler    Sig: Inhale 2 puffs into the lungs every 6 (six) hours  as needed for wheezing or shortness of breath.    Dispense:  1 Inhaler    Refill:  6  . furosemide (LASIX) 20 MG tablet    Sig: Take 1 tablet (20 mg total) by mouth daily.    Dispense:  30 tablet    Refill:  6    Must have office visit for refills  . rivaroxaban (XARELTO) 20 MG TABS tablet    Sig: Take 1 tablet (20 mg total) by mouth daily with breakfast.    Dispense:  30 tablet    Refill:  6    Must have office visit for refills  . hydrOXYzine (ATARAX/VISTARIL) 25 MG tablet    Sig: Take 1 tablet (25 mg total) by mouth 3 (three) times daily as needed for anxiety.    Dispense:  60 tablet    Refill:  6    Must have office visit for refills  . citalopram (CELEXA) 40 MG tablet    Sig: Take 1 tablet (40 mg total) by mouth daily.    Dispense:  30 tablet    Refill:  6    Must have office visit for refills  . cyclobenzaprine (FLEXERIL) 10 MG tablet    Sig: Take 1 tablet (10 mg total) by mouth 2 (two) times daily as needed  for muscle spasms.    Dispense:  60 tablet    Refill:  2    Follow-up: Return in about 3 months (around 05/19/2017) for follow up on chronic medical condition.   Arnoldo Morale MD

## 2017-02-16 NOTE — Patient Instructions (Signed)
Deep Vein Thrombosis A deep vein thrombosis (DVT) is a blood clot (thrombus) that usually occurs in a deep, larger vein of the lower leg or the pelvis, or in an upper extremity such as the arm. These are dangerous and can lead to serious and even life-threatening complications if the clot travels to the lungs. A DVT can damage the valves in your leg veins so that instead of flowing upward, the blood pools in the lower leg. This is called post-thrombotic syndrome, and it can result in pain, swelling, discoloration, and sores on the leg. What are the causes? A DVT is caused by the formation of a blood clot in your leg, pelvis, or arm. Usually, several things contribute to the formation of blood clots. A clot may develop when:  Your blood flow slows down.  Your vein becomes damaged in some way.  You have a condition that makes your blood clot more easily.  What increases the risk? A DVT is more likely to develop in:  People who are older, especially over 60 years of age.  People who are overweight (obese).  People who sit or lie still for a long time, such as during long-distance travel (over 4 hours), bed rest, hospitalization, or during recovery from certain medical conditions like a stroke.  People who do not engage in much physical activity (sedentary lifestyle).  People who have chronic breathing disorders.  People who have a personal or family history of blood clots or blood clotting disease.  People who have peripheral vascular disease (PVD), diabetes, or some types of cancer.  People who have heart disease, especially if the person had a recent heart attack or has congestive heart failure.  People who have neurological diseases that affect the legs (leg paresis).  People who have had a traumatic injury, such as breaking a hip or leg.  People who have recently had major or lengthy surgery, especially on the hip, knee, or abdomen.  People who have had a central line placed  inside a large vein.  People who take medicines that contain the hormone estrogen. These include birth control pills and hormone replacement therapy.  Pregnancy or during childbirth or the postpartum period.  Long plane flights (over 8 hours).  What are the signs or symptoms?  Symptoms of a DVT can include:  Swelling of your leg or arm, especially if one side is much worse.  Warmth and redness of your leg or arm, especially if one side is much worse.  Pain in your arm or leg. If the clot is in your leg, symptoms may be more noticeable or worse when you stand or walk.  A feeling of pins and needles, if the clot is in the arm.  The symptoms of a DVT that has traveled to the lungs (pulmonary embolism, PE) usually start suddenly and include:  Shortness of breath while active or at rest.  Coughing or coughing up blood or blood-tinged mucus.  Chest pain that is often worse with deep breaths.  Rapid or irregular heartbeat.  Feeling light-headed or dizzy.  Fainting.  Feeling anxious.  Sweating.  There may also be pain and swelling in a leg if that is where the blood clot started. These symptoms may represent a serious problem that is an emergency. Do not wait to see if the symptoms will go away. Get medical help right away. Call your local emergency services (911 in the [place] Do not drive yourself to the hospital. How is this diagnosed? Your health   care provider will take a medical history and perform a physical exam. You may also have other tests, including:  Blood tests to assess the clotting properties of your blood.  Imaging tests, such as CT, ultrasound, MRI, X-ray, and other tests to see if you have clots anywhere in your body.  How is this treated? After a DVT is identified, it can be treated. The type of treatment that you receive depends on many factors, such as the cause of your DVT, your risk for bleeding or developing more clots, and other medical conditions that  you have. Sometimes, a combination of treatments is necessary. Treatment options may be combined and include:  Monitoring the blood clot with ultrasound.  Taking medicines by mouth, such as newer blood thinners (anticoagulants), thrombolytics, or warfarin.  Taking anticoagulant medicine by injection or through an IV tube.  Wearing compression stockings or using different types ofdevices.  Surgery (rare) to remove the blood clot or to place a filter in your abdomen to stop the blood clot from traveling to your lungs.  Treatments for a DVT are often divided into immediate treatment and long-term treatment (up to 3 months after DVT). You can work with your health care provider to choose the treatment program that is best for you. Follow these instructions at home: If you are taking a newer oral anticoagulant:  Take the medicine every single day at the same time each day.  Understand what foods and drugs interact with this medicine.  Understand that there are no regular blood tests required when using this medicine.  Understand the side effects of this medicine, including excessive bruising or bleeding. Ask your health care provider or pharmacist about other possible side effects. If you are taking warfarin:  Understand how to take warfarin and know which foods can affect how warfarin works in your body.  Understand that it is dangerous to take too much or too little warfarin. Too much warfarin increases the risk of bleeding. Too little warfarin continues to allow the risk for blood clots.  Follow your PT and INR blood testing schedule. The PT and INR results allow your health care provider to adjust your dose of warfarin. It is very important that you have your PT and INR tested as often as told by your health care provider.  Avoid major changes in your diet, or tell your health care provider before you change your diet. Arrange a visit with a registered dietitian to answer your  questions. Many foods, especially foods that are high in vitamin K, can interfere with warfarin and affect the PT and INR results. Eat a consistent amount of foods that are high in vitamin K, such as: ? Spinach, kale, broccoli, cabbage, collard greens, turnip greens, Brussels sprouts, peas, cauliflower, seaweed, and parsley. ? Beef liver and pork liver. ? Green tea. ? Soybean oil.  Tell your health care provider about any and all medicines, vitamins, and supplements that you take, including aspirin and other over-the-counter anti-inflammatory medicines. Be especially cautious with aspirin and anti-inflammatory medicines. Do not take those before you ask your health care provider if it is safe to do so. This is important because many medicines can interfere with warfarin and affect the PT and INR results.  Do not start or stop taking any over-the-counter or prescription medicine unless your health care provider or pharmacist tells you to do so. If you take warfarin, you will also need to do these things:  Hold pressure over cuts for longer than   usual.  Tell your dentist and other health care providers that you are taking warfarin before you have any procedures in which bleeding may occur.  Avoid alcohol or drink very small amounts. Tell your health care provider if you change your alcohol intake.  Do not use tobacco products, including cigarettes, chewing tobacco, and e-cigarettes. If you need help quitting, ask your health care provider.  Avoid contact sports.  General instructions  Take over-the-counter and prescription medicines only as told by your health care provider. Anticoagulant medicines can have side effects, including easy bruising and difficulty stopping bleeding. If you are prescribed an anticoagulant, you will also need to do these things: ? Hold pressure over cuts for longer than usual. ? Tell your dentist and other health care providers that you are taking anticoagulants  before you have any procedures in which bleeding may occur. ? Avoid contact sports.  Wear a medical alert bracelet or carry a medical alert card that says you have had a PE.  Ask your health care provider how soon you can go back to your normal activities. Stay active to prevent new blood clots from forming.  Make sure to exercise while traveling or when you have been sitting or standing for a long period of time. It is very important to exercise. Exercise your legs by walking or by tightening and relaxing your leg muscles often. Take frequent walks.  Wear compression stockings as told by your health care provider to help prevent more blood clots from forming.  Do not use tobacco products, including cigarettes, chewing tobacco, and e-cigarettes. If you need help quitting, ask your health care provider.  Keep all follow-up appointments with your health care provider. This is important. How is this prevented? Take these actions to decrease your risk of developing another DVT:  Exercise regularly. For at least 30 minutes every day, engage in: ? Activity that involves moving your arms and legs. ? Activity that encourages good blood flow through your body by increasing your heart rate.  Exercise your arms and legs every hour during long-distance travel (over 4 hours). Drink plenty of water and avoid drinking alcohol while traveling.  Avoid sitting or lying in bed for long periods of time without moving your legs.  Maintain a weight that is appropriate for your height. Ask your health care provider what weight is healthy for you.  If you are a woman who is over 35 years of age, avoid unnecessary use of medicines that contain estrogen. These include birth control pills.  Do not smoke, especially if you take estrogen medicines. If you need help quitting, ask your health care provider.  If you are hospitalized, prevention measures may include:  Early walking after surgery, as soon as your  health care provider says that it is safe.  Receiving anticoagulants to prevent blood clots.If you cannot take anticoagulants, other options may be available, such as wearing compression stockings or using different types of devices.  Get help right away if:  You have new or increased pain, swelling, or redness in an arm or leg.  You have numbness or tingling in an arm or leg.  You have shortness of breath while active or at rest.  You have chest pain.  You have a rapid or irregular heartbeat.  You feel light-headed or dizzy.  You cough up blood.  You notice blood in your vomit, bowel movement, or urine. These symptoms may represent a serious problem that is an emergency. Do not wait to see   if the symptoms will go away. Get medical help right away. Call your local emergency services (911 in the [place] Do not drive yourself to the hospital. This information is not intended to replace advice given to you by your health care provider. Make sure you discuss any questions you have with your health care provider. Document Released: 08/24/2005 Document Revised: 01/30/2016 Document Reviewed: 12/19/2014 Elsevier Interactive Patient Education  2017 [employer]  

## 2017-02-16 NOTE — Progress Notes (Signed)
Patient is here for Med Refill  Patient complains of left leg swelling, with redness with hard place in the calf.  Patient has been out of medications for a few weeks Patient eats at midnight.

## 2017-08-04 DIAGNOSIS — J441 Chronic obstructive pulmonary disease with (acute) exacerbation: Secondary | ICD-10-CM

## 2017-10-04 ENCOUNTER — Emergency Department (HOSPITAL_BASED_OUTPATIENT_CLINIC_OR_DEPARTMENT_OTHER)
Admit: 2017-10-04 | Discharge: 2017-10-04 | Disposition: A | Discharge status: Home / Self Care | Payer: Self-pay | Attending: Emergency Medicine | Admitting: Emergency Medicine

## 2017-10-04 ENCOUNTER — Encounter (HOSPITAL_COMMUNITY): Payer: Self-pay

## 2017-10-04 ENCOUNTER — Emergency Department (HOSPITAL_COMMUNITY): Payer: Self-pay

## 2017-10-04 ENCOUNTER — Emergency Department (HOSPITAL_COMMUNITY)
Admission: EM | Admit: 2017-10-04 | Discharge: 2017-10-04 | Disposition: A | Discharge status: Home / Self Care | Payer: Self-pay | Attending: Emergency Medicine | Admitting: Emergency Medicine

## 2017-10-04 ENCOUNTER — Other Ambulatory Visit: Payer: Self-pay

## 2017-10-04 DIAGNOSIS — Z7901 Long term (current) use of anticoagulants: Secondary | ICD-10-CM | POA: Insufficient documentation

## 2017-10-04 DIAGNOSIS — M79609 Pain in unspecified limb: Secondary | ICD-10-CM

## 2017-10-04 DIAGNOSIS — Z5900 Homelessness unspecified: Secondary | ICD-10-CM

## 2017-10-04 DIAGNOSIS — I82592 Chronic embolism and thrombosis of other specified deep vein of left lower extremity: Secondary | ICD-10-CM | POA: Insufficient documentation

## 2017-10-04 DIAGNOSIS — F17210 Nicotine dependence, cigarettes, uncomplicated: Secondary | ICD-10-CM | POA: Insufficient documentation

## 2017-10-04 DIAGNOSIS — J449 Chronic obstructive pulmonary disease, unspecified: Secondary | ICD-10-CM | POA: Insufficient documentation

## 2017-10-04 DIAGNOSIS — Z79899 Other long term (current) drug therapy: Secondary | ICD-10-CM | POA: Insufficient documentation

## 2017-10-04 DIAGNOSIS — M7989 Other specified soft tissue disorders: Secondary | ICD-10-CM

## 2017-10-04 DIAGNOSIS — I2510 Atherosclerotic heart disease of native coronary artery without angina pectoris: Secondary | ICD-10-CM | POA: Insufficient documentation

## 2017-10-04 DIAGNOSIS — I825Z2 Chronic embolism and thrombosis of unspecified deep veins of left distal lower extremity: Secondary | ICD-10-CM

## 2017-10-04 LAB — CBC WITH DIFFERENTIAL/PLATELET
BASOS ABS: 0 10*3/uL (ref 0.0–0.1)
Basophils Relative: 1 %
Eosinophils Absolute: 0.1 10*3/uL (ref 0.0–0.7)
Eosinophils Relative: 2 %
HEMATOCRIT: 41.9 % (ref 39.0–52.0)
HEMOGLOBIN: 14.5 g/dL (ref 13.0–17.0)
LYMPHS PCT: 29 %
Lymphs Abs: 1.5 10*3/uL (ref 0.7–4.0)
MCH: 35 pg — ABNORMAL HIGH (ref 26.0–34.0)
MCHC: 34.6 g/dL (ref 30.0–36.0)
MCV: 101.2 fL — AB (ref 78.0–100.0)
MONO ABS: 0.5 10*3/uL (ref 0.1–1.0)
Monocytes Relative: 9 %
NEUTROS ABS: 3 10*3/uL (ref 1.7–7.7)
Neutrophils Relative %: 59 %
Platelets: 139 10*3/uL — ABNORMAL LOW (ref 150–400)
RBC: 4.14 MIL/uL — ABNORMAL LOW (ref 4.22–5.81)
RDW: 13.8 % (ref 11.5–15.5)
WBC: 5 10*3/uL (ref 4.0–10.5)

## 2017-10-04 LAB — COMPREHENSIVE METABOLIC PANEL
ALBUMIN: 3.5 g/dL (ref 3.5–5.0)
ALT: 78 U/L — ABNORMAL HIGH (ref 17–63)
ANION GAP: 11 (ref 5–15)
AST: 121 U/L — ABNORMAL HIGH (ref 15–41)
Alkaline Phosphatase: 103 U/L (ref 38–126)
BILIRUBIN TOTAL: 0.4 mg/dL (ref 0.3–1.2)
BUN: <5 mg/dL — ABNORMAL LOW (ref 6–20)
CO2: 22 mmol/L (ref 22–32)
Calcium: 8.5 mg/dL — ABNORMAL LOW (ref 8.9–10.3)
Chloride: 105 mmol/L (ref 101–111)
Creatinine, Ser: 0.6 mg/dL — ABNORMAL LOW (ref 0.61–1.24)
GFR calc Af Amer: >60 mL/min (ref >60)
GFR calc non Af Amer: >60 mL/min (ref >60)
GLUCOSE: 89 mg/dL (ref 65–99)
POTASSIUM: 3.9 mmol/L (ref 3.5–5.1)
SODIUM: 138 mmol/L (ref 135–145)
TOTAL PROTEIN: 7.1 g/dL (ref 6.5–8.1)

## 2017-10-04 LAB — ETHANOL: Alcohol, Ethyl (B): 305 mg/dL (ref <10)

## 2017-10-04 NOTE — Progress Notes (Signed)
RN attempted to walk pt and pt could not walk further than 25 feet to ambulate to the bathroom and presented with an unsteady gate.  Pt here for medication non-compliance due to need for meds from DVT in lower leg.  CSW provided pt with a taxi voucher.  Please reconsult if future social work needs arise.  CSW signing off, as social work intervention is no longer needed.  Alphonse Guild. Nikesha Kwasny, LCSW, LCAS, CSI Clinical Social Worker Ph: [phone]

## 2017-10-04 NOTE — ED Notes (Signed)
Bed: [facility] Expected date:  Expected time:  Means of arrival:  Comments: 

## 2017-10-04 NOTE — Discharge Instructions (Signed)
Follow-up with the resources given to you and discussed by care management and social work.

## 2017-10-04 NOTE — ED Provider Notes (Signed)
[facility] DEPT Provider Note   CSN: [id number] Arrival date & time: 10/04/17  1458     History   Chief Complaint Chief Complaint  Patient presents with  . Leg Pain    left    HPI [name] is a 52 y.o. male.  HPI Patient presents with left lower extremity pain.  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  Has been off his Xarelto for months because he cannot afford it.  He is homeless.  States that he drank a sixpack of beer which is typical form today.  States he has been having difficulty walking due to the pain.  States he just wants to be able to walk again.  States there has been some trauma on the foot.  States that his foot over logs and other things. Past Medical History:  Diagnosis Date  . Collagen vascular disease ([place])   . COPD (chronic obstructive pulmonary disease) ([place])   . Coronary artery disease   . Degenerative joint disease   . Degenerative joint disease of spine   . Depression   . DVT (deep venous thrombosis) ([place])   . ETOH abuse   . Hepatitis B   . Lupus anticoagulant disorder ([place]) 02/02/2012  . Pulmonary embolism ([place]) 06/2010    Patient Active Problem List   Diagnosis Date Noted  . MDD (major depressive disorder), recurrent severe, without psychosis ([place]) 12/23/2016  . Syncope and collapse 05/31/2016  . Low back pain 05/31/2016  . Chest pain 05/30/2016  . Left leg swelling   . COPD (chronic obstructive pulmonary disease) ([place]) 04/22/2016  . Acute respiratory failure with hypoxia ([place]) 02/09/2016  . Alcohol dependence with withdrawal, uncomplicated ([place]) 19/50/9326  . Suicidal ideations 02/09/2015  . GAD (generalized anxiety disorder) 09/18/2014  . Major depressive disorder, recurrent severe without psychotic features ([place]) 09/18/2014  . Alcohol abuse with alcohol-induced mood disorder ([place]) 09/17/2014  . Left leg DVT ([place]) 06/06/2014  . Upper leg DVT (deep venous thromboembolism), acute ([place]) 06/12/2013  .  Homeless single person 06/12/2013  . 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 Lupus anticoagulant disorder ([place]) 02/02/2012  . Degenerative joint disease   . Tobacco abuse 03/01/2011  . Pulmonary embolism ([place]) 06/07/2010    Past Surgical History:  Procedure Laterality Date  . LEFT ELBOW SURGERY    . TONSILLECTOMY         Home Medications    Prior to Admission medications   Medication Sig Start Date End Date Taking? Authorizing Provider  albuterol (PROVENTIL HFA;VENTOLIN HFA) 108 (90 Base) MCG/ACT inhaler Inhale 2 puffs into the lungs every 6 (six) hours as needed for wheezing or shortness of breath. 02/16/17   Charlott Rakes, MD  citalopram (CELEXA) 40 MG tablet Take 1 tablet (40 mg total) by mouth daily. 02/16/17   Charlott Rakes, MD  cyclobenzaprine (FLEXERIL) 10 MG tablet Take 1 tablet (10 mg total) by mouth 2 (two) times daily as needed for muscle spasms. 02/16/17   Charlott Rakes, MD  furosemide (LASIX) 20 MG tablet Take 1 tablet (20 mg total) by mouth daily. 02/16/17   Charlott Rakes, MD  hydrOXYzine (ATARAX/VISTARIL) 25 MG tablet Take 1 tablet (25 mg total) by mouth 3 (three) times daily as needed for anxiety. 02/16/17   Charlott Rakes, MD  mometasone-formoterol (DULERA) 100-5 MCG/ACT AERO Inhale 2 puffs into the lungs 2 (two) times daily. 02/16/17   Charlott Rakes, MD  rivaroxaban (XARELTO) 20 MG TABS tablet Take 1 tablet (20 mg total) by mouth  daily with breakfast. 02/16/17   Charlott Rakes, MD  tiotropium (SPIRIVA HANDIHALER) 18 MCG inhalation capsule Place 1 capsule (18 mcg total) into inhaler and inhale daily. 02/16/17   Charlott Rakes, MD    Family History Family History  Problem Relation Age of Onset  . Cancer Mother        Ovarian    Social History Social History   Tobacco Use  . Smoking status: Current Every Day Smoker    Packs/day: 2.00    Years: 30.00    Pack years: 60.00    Types: Cigarettes  . Smokeless tobacco: Never Used  Substance Use Topics  .  Alcohol use: Yes    Alcohol/week: 14.4 oz    Types: 24 Cans of beer per week    Comment: everyday- 24 cans  . Drug use: No     Allergies   Patient has no known allergies.   Review of Systems Review of Systems  Constitutional: Positive for fatigue.  Respiratory: Positive for cough. Negative for shortness of breath.   Cardiovascular: Positive for leg swelling.  Gastrointestinal: Negative for abdominal pain.  Genitourinary: Negative for flank pain.  Musculoskeletal: Positive for gait problem.       Left foot pain.  Skin: Negative for rash.  Neurological: Negative for numbness.  Hematological: Negative for adenopathy.  Psychiatric/Behavioral: Negative for confusion.     Physical Exam Updated Vital Signs BP 114/69 (BP Location: Right Arm)   Pulse 98   Temp 98.4 F (36.9 C) (Oral)   Resp 18   SpO2 94%   Physical Exam  Constitutional: He appears well-developed.  Patient appears intoxicated  HENT:  Head: Atraumatic.  Eyes: Pupils are equal, round, and reactive to light.  Neck: Neck supple.  Cardiovascular: Normal rate.  Pulmonary/Chest:  Few scattered wheezes  Abdominal: There is no tenderness.  Musculoskeletal: He exhibits edema.  Edema of bilateral lower extremities worse on the left.  Some discoloration to the left foot without erythema.  Tenderness over the foot.  Neurological: He is alert.  Skin: Skin is warm. Capillary refill takes less than 2 seconds.  Psychiatric: He has a normal mood and affect.     ED Treatments / Results  Labs (all labs ordered are listed, but only abnormal results are displayed) Labs Reviewed  ETHANOL - Abnormal; Notable for the following components:      Result Value   Alcohol, Ethyl (B) 305 (*)    All other components within normal limits  COMPREHENSIVE METABOLIC PANEL - Abnormal; Notable for the following components:   BUN <5 (*)    Creatinine, Ser 0.60 (*)    Calcium 8.5 (*)    AST 121 (*)    ALT 78 (*)    All other  components within normal limits  CBC WITH DIFFERENTIAL/PLATELET - Abnormal; Notable for the following components:   RBC 4.14 (*)    MCV 101.2 (*)    MCH 35.0 (*)    Platelets 139 (*)    All other components within normal limits    EKG  EKG Interpretation None       Radiology Dg Chest 2 View  Result Date: 10/04/2017 CLINICAL DATA:  Patient is brought in by EMS from the [place] station by [employer], where he lives. Patient reports not taking his Xarelto in 3 months "because I cant afford it." Patient found to have left lower leg edema and redness. EXAM: CHEST  2 VIEW COMPARISON:  09/29/2016 FINDINGS: Heart size is normal. Lungs  are free of focal consolidations and pleural effusions. Mild bibasilar atelectasis. Calcified granuloma is identified in the left mid lung zone. IMPRESSION: No evidence for acute  abnormality. Electronically Signed   By: Nolon Nations M.D.   On: 10/04/2017 17:53   Dg Foot Complete Left  Result Date: 10/04/2017 CLINICAL DATA:  Lower leg edema and redness. Not taking Xarelto because he can't afford it. History of smoking, COPD, lupus. EXAM: LEFT FOOT - COMPLETE 3+ VIEW COMPARISON:  None. FINDINGS: There is no acute fracture or subluxation. There is deformity of the distal fibula, compatible with remote injury. No radiopaque foreign body or soft tissue gas. No evidence for cortical erosion. IMPRESSION: 1.  No evidence for acute  abnormality. 2. Deformity of the distal fibula. Electronically Signed   By: Nolon Nations M.D.   On: 10/04/2017 17:52    Procedures Procedures (including critical care time)  Medications Ordered in ED Medications - No data to display   Initial Impression / Assessment and Plan / ED Course  I have reviewed the triage vital signs and the nursing notes.  Pertinent labs & imaging results that were available during my care of the patient were reviewed by me and considered in my medical decision making (see chart for details).       Patient with acute on chronic left leg pain.  Has been noncompliant with his medicines.  Is homeless.  Reportedly does not want to go to the [facility] and does not want to go to his primary care doctor.  Doppler done and has stable DVT.  Has [place] filter.  Does have history of noncompliance.  Patient states he does not want to follow-up with most the resources given but states he is going to follow-up with some new resources that social work and care management gave him.  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

## 2017-10-04 NOTE — Progress Notes (Addendum)
Consult request has been received. CSW attempting to follow up at present time.   CSW spoke with EDP who states pt reports not taking xarelto due to not being able to afford it.  EDP states he is placing CM consult, but that pt has been seen at [facility] before so we are unsure if pt can get meds through [facility] program.  Pt is homeless, but is not requesting assistance with ETOH use at this time. Per notes pt stated he drank "a six pack" of beers" today.  6:45 PM Pt states he is not homeless, has a place to live, but it has no running water, or electricity.  Pt states he wants to remain there rather than go to a shelter and is refusing shelter options in [place] and the CM's offers of resources for medicines in [place], as well.  Pt states he is interested in resources in [facility] to attain medicines and CSW provided contact information and address for [facility] (both locations) due to their receiving "HHS funding and has Education officer, museum [facility]) deemed status" at patient's request for assistance in [employer].    CSW also provided pt with contact information for the [facility], a "Home for the Uninsured".   Pt refused resources of any other kind including treatment for the use of alcohol and /or other substances stating, "I only drink a six-pack a day and I don't even consider that drinking".  CSW asked again and pt again refused all other resources stating, "I ain't gonna' go there", in regards to the [facility] and [facility], as well as other inpatient or outpatient resources.   Alphonse Guild. Bayan Kushnir, LCSW, LCAS, CSI Clinical Social Worker Ph: [phone]

## 2017-10-04 NOTE — ED Triage Notes (Addendum)
Patient is brought in by EMS from the [place] station by [employer], where he lives. Patient reports not taking his Xarelto in 3 months "because I cant afford it." Patient found to have left lower leg edema and redness. Patient denies SOB and CP for EMS. Patient reports only able to put pressure on his right leg, but no pressure to left leg. Patient stands, pivots, and sits in wheelchair. Patient cussing and yelling in triage. Patient admits to drinking "a six pack" of beers" today.

## 2017-10-04 NOTE — Progress Notes (Signed)
*  PRELIMINARY RESULTS* Vascular Ultrasound Left lower extremity venous duplex has been completed.  Preliminary findings: No evidence of acute deep vein thrombosis, previously seen chronic changes to the left common femoral, femoral and popliteal veins again noted.  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  Preliminary results given to provider @ 16:30.  Everrett Coombe 10/04/2017, 4:29 PM

## 2017-10-04 NOTE — Care Management Note (Signed)
Case Management Note  CM consulted for medication assistance.  CM noted that pt has been non-compliant in the past with medications and follow up appointments.  CM spoke with pt and discussed the need for follow up at the [facility] for further medication assistance or to follow up with Marliss Coots, NP at the [facility].  Pt reports he is not able to get to either location easily and he "would rather burn the hospital down than go to the [facility] especially in the winter."  CM asked if pt was suppose to be on xarelto permanently or if it was short term due to the DVTs in the past.  Pt reports it is suppose to be long term.  Pt reports that he has been off the medication for 3-4 months.  CM placed both the [facility] and [facility] information on pt's AVS and further encouraged him to follow up with someone.  CM will additionally contact both locations so they will be aware of the pt's ED visit.  Updated Dr. Alvino Chapel.  CSW speaking with pt additionally.  No further CM needs noted at this time.

## 2017-10-04 NOTE — ED Notes (Signed)
Pt discharged. Pt denies questions about d/c instructions. Pt assisted out in a wheelchair and Social work gave him a cab voucher due to his blood clot.  Pt also given a new pair of jeans from the donated clothing so he would not have to wear soiled clothing.

## 2017-10-06 ENCOUNTER — Telehealth: Payer: Self-pay

## 2017-10-06 NOTE — Telephone Encounter (Signed)
Message received from Haze Justin, RN CM noting the patient has been in the ED and did not want to follow up at [facility], however the contact # for the clinic is on his AVS to call and schedule an appointment.

## 2017-11-23 ENCOUNTER — Emergency Department (HOSPITAL_COMMUNITY): Payer: Medicaid Other

## 2017-11-23 ENCOUNTER — Other Ambulatory Visit: Payer: Self-pay

## 2017-11-23 ENCOUNTER — Inpatient Hospital Stay (HOSPITAL_COMMUNITY)
Admission: EM | Admit: 2017-11-23 | Discharge: 2017-11-26 | DRG: 190 | Disposition: A | Discharge status: Home / Self Care | Payer: Medicaid Other | Attending: Family Medicine | Admitting: Family Medicine

## 2017-11-23 ENCOUNTER — Emergency Department (HOSPITAL_BASED_OUTPATIENT_CLINIC_OR_DEPARTMENT_OTHER)
Admit: 2017-11-23 | Discharge: 2017-11-23 | Disposition: A | Discharge status: Home / Self Care | Payer: Medicaid Other | Attending: Emergency Medicine | Admitting: Emergency Medicine

## 2017-11-23 ENCOUNTER — Encounter (HOSPITAL_COMMUNITY): Payer: Self-pay

## 2017-11-23 DIAGNOSIS — I825Y2 Chronic embolism and thrombosis of unspecified deep veins of left proximal lower extremity: Secondary | ICD-10-CM

## 2017-11-23 DIAGNOSIS — E875 Hyperkalemia: Secondary | ICD-10-CM | POA: Diagnosis present

## 2017-11-23 DIAGNOSIS — Z91199 Patient's noncompliance with other medical treatment and regimen due to unspecified reason: Secondary | ICD-10-CM

## 2017-11-23 DIAGNOSIS — J9601 Acute respiratory failure with hypoxia: Secondary | ICD-10-CM | POA: Diagnosis present

## 2017-11-23 DIAGNOSIS — J441 Chronic obstructive pulmonary disease with (acute) exacerbation: Secondary | ICD-10-CM

## 2017-11-23 DIAGNOSIS — I2510 Atherosclerotic heart disease of native coronary artery without angina pectoris: Secondary | ICD-10-CM | POA: Diagnosis present

## 2017-11-23 DIAGNOSIS — F17210 Nicotine dependence, cigarettes, uncomplicated: Secondary | ICD-10-CM | POA: Diagnosis present

## 2017-11-23 DIAGNOSIS — M7989 Other specified soft tissue disorders: Secondary | ICD-10-CM

## 2017-11-23 DIAGNOSIS — Z86711 Personal history of pulmonary embolism: Secondary | ICD-10-CM

## 2017-11-23 DIAGNOSIS — R0902 Hypoxemia: Secondary | ICD-10-CM | POA: Diagnosis present

## 2017-11-23 DIAGNOSIS — F419 Anxiety disorder, unspecified: Secondary | ICD-10-CM | POA: Diagnosis present

## 2017-11-23 DIAGNOSIS — D6862 Lupus anticoagulant syndrome: Secondary | ICD-10-CM | POA: Diagnosis present

## 2017-11-23 DIAGNOSIS — Z9114 Patient's other noncompliance with medication regimen: Secondary | ICD-10-CM

## 2017-11-23 DIAGNOSIS — F411 Generalized anxiety disorder: Secondary | ICD-10-CM | POA: Diagnosis present

## 2017-11-23 DIAGNOSIS — I82402 Acute embolism and thrombosis of unspecified deep veins of left lower extremity: Secondary | ICD-10-CM | POA: Diagnosis present

## 2017-11-23 DIAGNOSIS — F10229 Alcohol dependence with intoxication, unspecified: Secondary | ICD-10-CM | POA: Diagnosis present

## 2017-11-23 DIAGNOSIS — R296 Repeated falls: Secondary | ICD-10-CM | POA: Diagnosis present

## 2017-11-23 DIAGNOSIS — Z7901 Long term (current) use of anticoagulants: Secondary | ICD-10-CM

## 2017-11-23 DIAGNOSIS — Z9119 Patient's noncompliance with other medical treatment and regimen: Secondary | ICD-10-CM

## 2017-11-23 DIAGNOSIS — R062 Wheezing: Secondary | ICD-10-CM

## 2017-11-23 DIAGNOSIS — F10239 Alcohol dependence with withdrawal, unspecified: Secondary | ICD-10-CM | POA: Diagnosis present

## 2017-11-23 LAB — CBC WITH DIFFERENTIAL/PLATELET
Basophils Absolute: 0.1 10*3/uL (ref 0.0–0.1)
Basophils Relative: 1 %
EOS ABS: 0.1 10*3/uL (ref 0.0–0.7)
EOS PCT: 1 %
HCT: 43 % (ref 39.0–52.0)
Hemoglobin: 14.3 g/dL (ref 13.0–17.0)
LYMPHS ABS: 1.6 10*3/uL (ref 0.7–4.0)
Lymphocytes Relative: 36 %
MCH: 35.4 pg — AB (ref 26.0–34.0)
MCHC: 33.3 g/dL (ref 30.0–36.0)
MCV: 106.4 fL — ABNORMAL HIGH (ref 78.0–100.0)
Monocytes Absolute: 0.4 10*3/uL (ref 0.1–1.0)
Monocytes Relative: 9 %
NEUTROS PCT: 53 %
Neutro Abs: 2.4 10*3/uL (ref 1.7–7.7)
PLATELETS: 120 10*3/uL — AB (ref 150–400)
RBC: 4.04 MIL/uL — AB (ref 4.22–5.81)
RDW: 14.7 % (ref 11.5–15.5)
WBC: 4.5 10*3/uL (ref 4.0–10.5)

## 2017-11-23 LAB — MAGNESIUM: Magnesium: 1.7 mg/dL (ref 1.7–2.4)

## 2017-11-23 LAB — I-STAT CHEM 8, ED
BUN: <3 mg/dL — ABNORMAL LOW (ref 6–20)
CHLORIDE: 103 mmol/L (ref 101–111)
Calcium, Ion: 1.09 mmol/L — ABNORMAL LOW (ref 1.15–1.40)
Creatinine, Ser: 1 mg/dL (ref 0.61–1.24)
Glucose, Bld: 85 mg/dL (ref 65–99)
HEMATOCRIT: 46 % (ref 39.0–52.0)
HEMOGLOBIN: 15.6 g/dL (ref 13.0–17.0)
POTASSIUM: 4.1 mmol/L (ref 3.5–5.1)
SODIUM: 141 mmol/L (ref 135–145)
TCO2: 24 mmol/L (ref 22–32)

## 2017-11-23 LAB — COMPREHENSIVE METABOLIC PANEL
ALT: 73 U/L — AB (ref 17–63)
ANION GAP: 11 (ref 5–15)
AST: 139 U/L — ABNORMAL HIGH (ref 15–41)
Albumin: 3.7 g/dL (ref 3.5–5.0)
Alkaline Phosphatase: 100 U/L (ref 38–126)
BUN: 5 mg/dL — ABNORMAL LOW (ref 6–20)
CHLORIDE: 106 mmol/L (ref 101–111)
CO2: 26 mmol/L (ref 22–32)
Calcium: 9.1 mg/dL (ref 8.9–10.3)
Creatinine, Ser: 0.67 mg/dL (ref 0.61–1.24)
GFR calc non Af Amer: >60 mL/min (ref >60)
GLUCOSE: 87 mg/dL (ref 65–99)
Potassium: 4.1 mmol/L (ref 3.5–5.1)
SODIUM: 143 mmol/L (ref 135–145)
Total Bilirubin: 0.2 mg/dL — ABNORMAL LOW (ref 0.3–1.2)
Total Protein: 7.1 g/dL (ref 6.5–8.1)

## 2017-11-23 LAB — PROTIME-INR
INR: 0.85
PROTHROMBIN TIME: 11.6 s (ref 11.4–15.2)

## 2017-11-23 LAB — ETHANOL: Alcohol, Ethyl (B): 339 mg/dL (ref <10)

## 2017-11-23 LAB — TROPONIN I: Troponin I: <0.03 ng/mL (ref <0.03)

## 2017-11-23 LAB — BRAIN NATRIURETIC PEPTIDE: B Natriuretic Peptide: 31.2 pg/mL (ref 0.0–100.0)

## 2017-11-23 MED ORDER — IPRATROPIUM-ALBUTEROL 0.5-2.5 (3) MG/3ML IN SOLN
3.0000 mL | Freq: Once | RESPIRATORY_TRACT | Status: AC
  Administered 2017-11-23: 3 mL via RESPIRATORY_TRACT
  Filled 2017-11-23: qty 3

## 2017-11-23 MED ORDER — ALBUTEROL SULFATE HFA 108 (90 BASE) MCG/ACT IN AERS
2.0000 | INHALATION_SPRAY | Freq: Four times a day (QID) | RESPIRATORY_TRACT | 6 refills | Status: DC | PRN
Start: 2017-11-23 — End: 2017-11-26

## 2017-11-23 MED ORDER — TIOTROPIUM BROMIDE MONOHYDRATE 18 MCG IN CAPS: 18.0000 ug | ORAL_CAPSULE | Freq: Every day | RESPIRATORY_TRACT | 6 refills | Status: AC

## 2017-11-23 MED ORDER — FOLIC ACID 5 MG/ML IJ SOLN
1.0000 mg | Freq: Every day | INTRAMUSCULAR | Status: DC
  Administered 2017-11-23 – 2017-11-24 (×2): 1 mg via INTRAVENOUS
  Filled 2017-11-23 (×4): qty 0.2

## 2017-11-23 MED ORDER — IOPAMIDOL (ISOVUE-370) INJECTION 76%
INTRAVENOUS | Status: AC
  Administered 2017-11-23: 100 mL
  Filled 2017-11-23: qty 100

## 2017-11-23 MED ORDER — LORAZEPAM 1 MG PO TABS: 0.0000 mg | ORAL_TABLET | Freq: Two times a day (BID) | ORAL | Status: DC

## 2017-11-23 MED ORDER — RIVAROXABAN (XARELTO) VTE STARTER PACK (15 & 20 MG): ORAL_TABLET | ORAL | 0 refills | Status: AC

## 2017-11-23 MED ORDER — MOMETASONE FURO-FORMOTEROL FUM 100-5 MCG/ACT IN AERO
2.0000 | INHALATION_SPRAY | Freq: Once | RESPIRATORY_TRACT | Status: AC
  Administered 2017-11-23: 2 via RESPIRATORY_TRACT
  Filled 2017-11-23: qty 8.8

## 2017-11-23 MED ORDER — THIAMINE HCL 100 MG/ML IJ SOLN
100.0000 mg | Freq: Once | INTRAMUSCULAR | Status: AC
  Administered 2017-11-23: 100 mg via INTRAVENOUS
  Filled 2017-11-23: qty 2

## 2017-11-23 MED ORDER — LORAZEPAM 1 MG PO TABS
0.0000 mg | ORAL_TABLET | Freq: Four times a day (QID) | ORAL | Status: DC
  Administered 2017-11-25: 1 mg via ORAL

## 2017-11-23 MED ORDER — LORAZEPAM 2 MG/ML IJ SOLN: 0.0000 mg | Freq: Two times a day (BID) | INTRAMUSCULAR | Status: DC

## 2017-11-23 MED ORDER — LORAZEPAM 2 MG/ML IJ SOLN
0.0000 mg | Freq: Four times a day (QID) | INTRAMUSCULAR | Status: DC
  Administered 2017-11-24 (×3): 2 mg via INTRAVENOUS
  Filled 2017-11-23 (×3): qty 1

## 2017-11-23 NOTE — ED Provider Notes (Signed)
Patient signed out from [facility], PA-C, at end of shift. He has a h/o DVT noncompliant with Xarelto. He has had multiple falls recently - found down today and EMS was called. CT's performed in evaluation, including CTA to r/o PE, are negative. ETOH 340.   Plan: Sobering up - then ambulate Has one month supply Xarelto for discharge.   The patient started developing tremors, tachycardia, nausea suggesting alcohol withdrawal. CIWA protocol started with improvement. Banana Bag and additional IV fluids provided.   He is found to have low O2 saturations and in each of the readings the patient was sleeping. He refuses to leave O2 [place] on and removes it when nurse is not in the room. History of COPD, continuous smoker. When awake O2 saturations normal. He denies SOB.   He is ambulated and found to be off balance, unsteady. I-stat chem 8 repeated to evaluate for alcoholic acidosis. No vomiting. Anion gap improving. CBG 71 -  IV dextrose 1/2 NS started. Anticipate possible admission for alcohol withdrawal.   Potassium elevated to 6.9 on I-stat chem 8. Repeated to verify. Tachycardia improving. Patient is alert, oriented, appropriate.   Repeat potassium pending. Patient care signed out to Tanna Furry, MD, pending review and re-evaluation of patient condition for disposition.   Charlann Lange, PA-C 11/27/17 0101    Orlie Dakin, MD 11/28/17 1220

## 2017-11-23 NOTE — Progress Notes (Signed)
Left lower extremity venous duplex has been completed. There is evidence of chronic deep vein thrombosis involving the common femoral, femoral, and popliteal veins of the left lower extremity. There is also evidence of age indeterminate deep vein thrombosis involving the gastrocnemius veins of the left lower extremity. A cystic structure measuring 2.8 cm high by greater than 4.7 cm wide by greater than 5.3 cm long is found in the left popliteal fossa.  Results were given to [facility] PA.  11/23/17 5:56 PM [name] RVT

## 2017-11-23 NOTE — ED Triage Notes (Signed)
Per EMS- Patient has left leg swelling Patient had a known left leg DVT. Patient has been off of Xarelto x 4 months because he lost his Medicaid.

## 2017-11-23 NOTE — Care Management Note (Signed)
Case Management Note  CM consulted for medication assistance.  Pt is well known to CM/CSW.  Pt has a hx of homelessness, lives in a tent near a hotel off of [address] and years of medication non-compliance.  Pt has been MATCHED. CM picked up xeralto starter pack and albuterol inhaler for pt with $0 co-pay.  Pt's spiriva was ordered and will be at the [facility] tomorrow.  Pt does not qualify for the 30 day free xeralto card because he has already used his one per lifetime.  Medications and MATCH letter left with [place], PA to give to pt at time of D/C and when he is less intoxicated.  Information for Marliss Coots, NP and the [facility] placed on AVS for pt to follow up for continued assistance.  No further CM needs at this time.

## 2017-11-23 NOTE — ED Notes (Signed)
Patient transported to CT 

## 2017-11-23 NOTE — ED Provider Notes (Signed)
[facility] DEPT Provider Note   CSN: [id number] Arrival date & time: 11/23/17  1620     History   Chief Complaint Chief Complaint  Patient presents with  . Leg Swelling   HPI   Blood pressure 105/62, pulse 90, temperature 97.9 F (36.6 C), temperature source Oral, resp. rate 16, height 6\' 2"  (1.88 m), weight 99.8 kg (220 lb), SpO2 96 %.  [name] is a 52 y.o. male past medical history significant for COPD, CAD, DVT, PE, alcohol abuse, homelessness complaining of increasing pain and swelling to left lower extremity where he has a known DVT.  Is been able unable to afford any of his medications over the course of the last 4 months.  He states that he is falling frequently because the pain is so severe.  He has had head trauma, he is unsure if there is been loss of consciousness.  He is reporting increasing shortness of breath and cough with sputum production he is unsure if this is his COPD.  He denies palpitations, syncope.  He last had alcohol this morning around noon.  He has a history of DTs with hallucinations and seizures.  Patient states that he would be willing to go into detox however he is intoxicated and he also states that he wants to go to the "creamatorium."  Past Medical History:  Diagnosis Date  . Collagen vascular disease ([place])   . COPD (chronic obstructive pulmonary disease) ([place])   . Coronary artery disease   . Degenerative joint disease   . Degenerative joint disease of spine   . Depression   . DVT (deep venous thrombosis) ([place])   . ETOH abuse   . Hepatitis B   . Lupus anticoagulant disorder ([place]) 02/02/2012  . Pulmonary embolism ([place]) 06/2010    Patient Active Problem List   Diagnosis Date Noted  . MDD (major depressive disorder), recurrent severe, without psychosis ([place]) 12/23/2016  . Syncope and collapse 05/31/2016  . Low back pain 05/31/2016  . Chest pain 05/30/2016  . Left leg swelling   . COPD (chronic obstructive  pulmonary disease) ([place]) 04/22/2016  . Acute respiratory failure with hypoxia ([place]) 02/09/2016  . Alcohol dependence with withdrawal, uncomplicated ([place]) 56/21/3086  . Suicidal ideations 02/09/2015  . GAD (generalized anxiety disorder) 09/18/2014  . Major depressive disorder, recurrent severe without psychotic features ([place]) 09/18/2014  . Alcohol abuse with alcohol-induced mood disorder ([place]) 09/17/2014  . Left leg DVT ([place]) 06/06/2014  . Upper leg DVT (deep venous thromboembolism), acute ([place]) 06/12/2013  . Homeless single person 06/12/2013  . 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 Lupus anticoagulant disorder ([place]) 02/02/2012  . Degenerative joint disease   . Tobacco abuse 03/01/2011  . Pulmonary embolism ([place]) 06/07/2010    Past Surgical History:  Procedure Laterality Date  . LEFT ELBOW SURGERY    . TONSILLECTOMY         Home Medications    Prior to Admission medications   Medication Sig Start Date End Date Taking? Authorizing Provider  Aspirin-Salicylamide-Caffeine (BC HEADACHE PO) Take 1 packet by mouth 2 (two) times daily as needed (pain).   Yes [provider]  albuterol (PROVENTIL HFA;VENTOLIN HFA) 108 (90 Base) MCG/ACT inhaler Inhale 2 puffs into the lungs every 6 (six) hours as needed for wheezing or shortness of breath. 11/23/17   Zoeann Mol, Elmyra Ricks, PA-C  citalopram (CELEXA) 40 MG tablet Take 1 tablet (40 mg total) by mouth daily. 02/16/17   Charlott Rakes, MD  cyclobenzaprine (FLEXERIL) 10  MG tablet Take 1 tablet (10 mg total) by mouth 2 (two) times daily as needed for muscle spasms. 02/16/17   Charlott Rakes, MD  furosemide (LASIX) 20 MG tablet Take 1 tablet (20 mg total) by mouth daily. 02/16/17   Charlott Rakes, MD  hydrOXYzine (ATARAX/VISTARIL) 25 MG tablet Take 1 tablet (25 mg total) by mouth 3 (three) times daily as needed for anxiety. 02/16/17   Charlott Rakes, MD  mometasone-formoterol (DULERA) 100-5 MCG/ACT AERO Inhale 2 puffs into the lungs 2 (two)  times daily. 02/16/17   Charlott Rakes, MD  Rivaroxaban 15 & 20 MG TBPK Take as directed on package: Start with one 15mg  tablet by mouth twice a day with food. On Day 22, switch to one 20mg  tablet once a day with food. 11/23/17   Sharnise Blough, Elmyra Ricks, PA-C  tiotropium (SPIRIVA HANDIHALER) 18 MCG inhalation capsule Place 1 capsule (18 mcg total) into inhaler and inhale daily. 11/23/17   Illya Gienger, Elmyra Ricks, PA-C    Family History Family History  Problem Relation Age of Onset  . Cancer Mother        Ovarian    Social History Social History   Tobacco Use  . Smoking status: Current Every Day Smoker    Packs/day: 2.00    Years: 30.00    Pack years: 60.00    Types: Cigarettes  . Smokeless tobacco: Never Used  Substance Use Topics  . Alcohol use: Yes    Alcohol/week: 14.4 oz    Types: 24 Cans of beer per week    Comment: everyday- 24 cans  . Drug use: No     Allergies   Patient has no known allergies.   Review of Systems Review of Systems  A complete review of systems was obtained and all systems are negative except as noted in the HPI and PMH.   Physical Exam Updated Vital Signs BP 134/82   Pulse (!) 113   Temp 97.9 F (36.6 C) (Oral)   Resp 18   Ht 6\' 2"  (1.88 m)   Wt 99.8 kg (220 lb)   SpO2 94%   BMI 28.25 kg/m   Physical Exam  Physical Exam  Constitutional: He is oriented to person, place, and time. He appears well-developed and well-nourished. No distress.  HENT:  Head: Normocephalic and atraumatic.  Mouth/Throat: Oropharynx is clear and moist.  Eyes: Conjunctivae and EOM are normal. Pupils are equal, round, and reactive to light.  Neck: Normal range of motion.  Cardiovascular: Normal rate, regular rhythm and intact distal pulses.  Pulmonary/Chest: Effort normal and breath sounds normal.  Abdominal: Soft. There is no tenderness.  Musculoskeletal: Normal range of motion. He exhibits edema.  3+ pitting edema to knee with ecchymoses, hyperpigmentation consistent  with venous stasis, distally neurovascularly intact.  Brisk cap refill  Neurological: He is alert and oriented to person, place, and time.  Skin: He is not diaphoretic.  Multiple home drawn tattoos  Psychiatric: He has a normal mood and affect.  Nursing note and vitals reviewed.  ED Treatments / Results  Labs (all labs ordered are listed, but only abnormal results are displayed) Labs Reviewed  CBC WITH DIFFERENTIAL/PLATELET - Abnormal; Notable for the following components:      Result Value   RBC 4.04 (*)    MCV 106.4 (*)    MCH 35.4 (*)    Platelets 120 (*)    All other components within normal limits  COMPREHENSIVE METABOLIC PANEL - Abnormal; Notable for the following components:   BUN 5 (*)  AST 139 (*)    ALT 73 (*)    Total Bilirubin 0.2 (*)    All other components within normal limits  ETHANOL - Abnormal; Notable for the following components:   Alcohol, Ethyl (B) 339 (*)    All other components within normal limits  I-STAT CHEM 8, ED - Abnormal; Notable for the following components:   BUN <3 (*)    Calcium, Ion 1.09 (*)    All other components within normal limits  PROTIME-INR  MAGNESIUM  TROPONIN I  BRAIN NATRIURETIC PEPTIDE    EKG  EKG Interpretation None        Radiology Ct Head Wo Contrast  Result Date: 11/23/2017 CLINICAL DATA:  Multiple falls shortness of breath EXAM: CT HEAD WITHOUT CONTRAST CT CERVICAL SPINE WITHOUT CONTRAST TECHNIQUE: Multidetector CT imaging of the head and cervical spine was performed following the standard protocol without intravenous contrast. Multiplanar CT image reconstructions of the cervical spine were also generated. COMPARISON:  Head CT 03/05/2013, CT cervical spine 08/04/2012 FINDINGS: CT HEAD FINDINGS Brain: No acute territorial infarction, hemorrhage, or intracranial mass is visualized. Moderate atrophy. Stable ventricle size. Enlarged retro cerebellar CSF density unchanged, possible arachnoid cyst. Mild small vessel  ischemic changes of the white matter. Vascular: No hyperdense vessels.  No unexpected calcification Skull: No fracture Sinuses/Orbits: Mucosal thickening and retention cysts in the left sphenoid sinus. No acute orbital abnormality Other: None CT CERVICAL SPINE FINDINGS Alignment: Mild reversal of cervical lordosis. No subluxation. Facet alignment within normal limits. Skull base and vertebrae: No acute fracture. No primary bone lesion or focal pathologic process. Soft tissues and spinal canal: No prevertebral fluid or swelling. No visible canal hematoma. Disc levels:  Mild degenerative changes at C4-C5, C5-C6 and C6-C7. Upper chest: Mild apical emphysema. Other: None IMPRESSION: 1. No CT evidence for acute intracranial abnormality. Atrophy, prominent for age. Mild small vessel ischemic changes of the white matter. 2. Reversal of cervical lordosis with degenerative changes. No acute osseous abnormality. Electronically Signed   By: Donavan Foil M.D.   On: 11/23/2017 20:03   Ct Angio Chest Pe W And/or Wo Contrast  Result Date: 11/23/2017 CLINICAL DATA:  Chronic dyspnea, multiple falls over past 3 weeks, shortness of breath, wheezing, history COPD, collagen vascular disease, pulmonary embolism, smoker EXAM: CT ANGIOGRAPHY CHEST WITH CONTRAST TECHNIQUE: Multidetector CT imaging of the chest was performed using the standard protocol during bolus administration of intravenous contrast. Multiplanar CT image reconstructions and MIPs were obtained to evaluate the vascular anatomy. CONTRAST:  117mL ISOVUE-370 IOPAMIDOL (ISOVUE-370) INJECTION 76% IV COMPARISON:  12/21/2016 FINDINGS: Cardiovascular: Minimal atherosclerotic calcification aorta. Aorta normal caliber without aneurysm or dissection. No pericardial effusion. Scattered respiratory motion artifacts in the lower lobes. Pulmonary arteries adequately opacified and patent. No evidence of pulmonary embolism. Mediastinum/Nodes: Esophagus unremarkable. Base of cervical  region normal appearance. No thoracic adenopathy. Lungs/Pleura: Calcified granuloma at superior segment LEFT lower lobe. Question atelectasis in medial RIGHT upper lobe posteriorly. No pulmonary infiltrate, pleural effusion or pneumothorax. Upper Abdomen: Diffuse fatty infiltration of liver. Remaining visualized upper abdomen unremarkable. Musculoskeletal: No acute osseous findings. Review of the MIP images confirms the above findings. IMPRESSION: No evidence of pulmonary embolism. Old granulomatous disease. Subsegmental atelectasis RIGHT upper lobe. Hepatic steatosis. Aortic Atherosclerosis (ICD10-I70.0). Electronically Signed   By: Lavonia Dana M.D.   On: 11/23/2017 20:01   Ct Cervical Spine Wo Contrast  Result Date: 11/23/2017 CLINICAL DATA:  Multiple falls shortness of breath EXAM: CT HEAD WITHOUT CONTRAST CT CERVICAL SPINE WITHOUT  CONTRAST TECHNIQUE: Multidetector CT imaging of the head and cervical spine was performed following the standard protocol without intravenous contrast. Multiplanar CT image reconstructions of the cervical spine were also generated. COMPARISON:  Head CT 03/05/2013, CT cervical spine 08/04/2012 FINDINGS: CT HEAD FINDINGS Brain: No acute territorial infarction, hemorrhage, or intracranial mass is visualized. Moderate atrophy. Stable ventricle size. Enlarged retro cerebellar CSF density unchanged, possible arachnoid cyst. Mild small vessel ischemic changes of the white matter. Vascular: No hyperdense vessels.  No unexpected calcification Skull: No fracture Sinuses/Orbits: Mucosal thickening and retention cysts in the left sphenoid sinus. No acute orbital abnormality Other: None CT CERVICAL SPINE FINDINGS Alignment: Mild reversal of cervical lordosis. No subluxation. Facet alignment within normal limits. Skull base and vertebrae: No acute fracture. No primary bone lesion or focal pathologic process. Soft tissues and spinal canal: No prevertebral fluid or swelling. No visible canal  hematoma. Disc levels:  Mild degenerative changes at C4-C5, C5-C6 and C6-C7. Upper chest: Mild apical emphysema. Other: None IMPRESSION: 1. No CT evidence for acute intracranial abnormality. Atrophy, prominent for age. Mild small vessel ischemic changes of the white matter. 2. Reversal of cervical lordosis with degenerative changes. No acute osseous abnormality. Electronically Signed   By: Donavan Foil M.D.   On: 11/23/2017 20:03   Dg Knee Complete 4 Views Right  Result Date: 11/23/2017 CLINICAL DATA:  Right anterior knee pain. Multiple falls over the past 3 weeks. Initial encounter. EXAM: RIGHT KNEE - COMPLETE 4+ VIEW COMPARISON:  None. FINDINGS: There is no evidence of acute fracture, dislocation, or knee joint effusion. Joint space widths are preserved without significant arthropathic changes identified. A few small soft tissue calcifications are noted in the lower leg. IMPRESSION: No acute osseous abnormality. Electronically Signed   By: Logan Bores M.D.   On: 11/23/2017 18:16    Procedures Procedures (including critical care time)  Medications Ordered in ED Medications  folic acid injection 1 mg (1 mg Intravenous Given 11/23/17 1923)  LORazepam (ATIVAN) injection 0-4 mg (not administered)    Or  LORazepam (ATIVAN) tablet 0-4 mg (not administered)  LORazepam (ATIVAN) injection 0-4 mg (not administered)    Or  LORazepam (ATIVAN) tablet 0-4 mg (not administered)  ipratropium-albuterol (DUONEB) 0.5-2.5 (3) MG/3ML nebulizer solution 3 mL (3 mLs Nebulization Given 11/23/17 1831)  thiamine (B-1) injection 100 mg (100 mg Intravenous Given 11/23/17 1859)  iopamidol (ISOVUE-370) 76 % injection (100 mLs  Contrast Given 11/23/17 1929)  mometasone-formoterol (DULERA) 100-5 MCG/ACT inhaler 2 puff (2 puffs Inhalation Given 11/23/17 2226)     Initial Impression / Assessment and Plan / ED Course  I have reviewed the triage vital signs and the nursing notes.  Pertinent labs & imaging results that were  available during my care of the patient were reviewed by me and considered in my medical decision making (see chart for details).     Vitals:   11/23/17 1904 11/23/17 2111 11/23/17 2130 11/23/17 2139  BP: 93/62 90/63 134/82 134/82  Pulse: 81 (!) 110 (!) 113 (!) 113  Resp: 15 16 18    Temp:      TempSrc:      SpO2: 100% (!) 84% 94%   Weight:      Height:        Medications  folic acid injection 1 mg (1 mg Intravenous Given 11/23/17 1923)  LORazepam (ATIVAN) injection 0-4 mg (not administered)    Or  LORazepam (ATIVAN) tablet 0-4 mg (not administered)  LORazepam (ATIVAN) injection 0-4 mg (not administered)  Or  LORazepam (ATIVAN) tablet 0-4 mg (not administered)  ipratropium-albuterol (DUONEB) 0.5-2.5 (3) MG/3ML nebulizer solution 3 mL (3 mLs Nebulization Given 11/23/17 1831)  thiamine (B-1) injection 100 mg (100 mg Intravenous Given 11/23/17 1859)  iopamidol (ISOVUE-370) 76 % injection (100 mLs  Contrast Given 11/23/17 1929)  mometasone-formoterol (DULERA) 100-5 MCG/ACT inhaler 2 puff (2 puffs Inhalation Given 11/23/17 2226)    Nasiah Lehenbauer is 52 y.o. male presenting with left lower extremity edema and pain.  He is a known DVT.  He is noncompliant with his anticoagulation.  This patient is homeless, he is reporting chest pain shortness of breath.  Lung sounds with wheezing, will give DuoNeb.  Given his underlying pathology will need to rule out PE, will also check EKG, troponin, BNP  Verbal report from vascular tech with stable DVT to left lower extremity and new large Baker's cyst.  Workup otherwise unremarkable, CT negative for PE.  It appears that this patient also has IVC filter.  Lung sounds cleared after DuoNeb.  Worked with case Therapist, art who was able to obtain a starter pack of Xarelto in addition to a albuterol inhaler and his Lasix.  Given his elevated alcohol level of 339, he will need to sober in the ED.  He will need a trial of ambulation.  Case signed out to PA  Upstill at shift change.   Final Clinical Impressions(s) / ED Diagnoses   Final diagnoses:  Chronic deep vein thrombosis (DVT) of proximal vein of left lower extremity ([place])  Wheezing  Medical non-compliance    ED Discharge Orders        Ordered    Rivaroxaban 15 & 20 MG TBPK     11/23/17 1732    albuterol (PROVENTIL HFA;VENTOLIN HFA) 108 (90 Base) MCG/ACT inhaler  Every 6 hours PRN     11/23/17 1733    tiotropium (SPIRIVA HANDIHALER) 18 MCG inhalation capsule  Daily    Comments:  Must have office visit for refills   11/23/17 1733       Andersyn Fragoso, Charna Elizabeth 11/24/17 0003    Orlie Dakin, MD 11/24/17 [phone]

## 2017-11-23 NOTE — ED Notes (Signed)
Pt will not keep O2 on. He is argumentative and wanting something to eat. We were able to talk him into putting his oxygen on. Sats are maintaining in the mid 90s. He has been given food.

## 2017-11-23 NOTE — ED Notes (Signed)
CRITICAL VALUE ALERT  Critical Value:  Alcohol 339  Date & Time Notied:  11/23/17 2017  Provider Notified: Elmyra Ricks, [place]

## 2017-11-23 NOTE — ED Provider Notes (Addendum)
Patient reports left leg swelling for several months.  He reports that he is had multiple falls over the past 3 weeks due to difficulty walking.  He also reports shortness of breath and wheezing stating "I have COPD".  He reports that he is not taken Xarelto for several weeks because "I cannot afford it" presently homeless.  Exam chronically ill-appearing, unkempt.  HEENT exam no facial asymmetry.  Neck supple lungs prolonged expiratory phase with extra Tory wheezes abdomen no contusion or abrasion nontender.  Left lower extremity is edematous, nontender.  DP pulse 2+.  Chronic brawny skin changes below the knee.  Right lower extremity no swelling no deformity he is mildly tender over the patella.  DP pulse 2+.  upper extremity with index finger amputated(old, healed) otherwise traumatic neurovascular intact.  Right upper cavity without contusion abrasion or tenderness neurovascular intact neurologic Glasgow Coma Score 15 cranial nerves II through XII intact moves all extremities well   Orlie Dakin, MD 11/23/17 1809 ED ECG REPORT   Date: 11/23/2017  Rate: 90  Rhythm: normal sinus rhythm  QRS Axis: normal  Intervals: normal  ST/T Wave abnormalities: early repolarization  Conduction Disutrbances:none  Narrative Interpretation:   Old EKG Reviewed: unchanged  I have personally reviewed the EKG tracing and agree with the computerized printout as noted.   Orlie Dakin, MD 11/23/17 [phone]

## 2017-11-24 DIAGNOSIS — Z9119 Patient's noncompliance with other medical treatment and regimen: Secondary | ICD-10-CM

## 2017-11-24 DIAGNOSIS — F10230 Alcohol dependence with withdrawal, uncomplicated: Secondary | ICD-10-CM

## 2017-11-24 DIAGNOSIS — R0902 Hypoxemia: Secondary | ICD-10-CM

## 2017-11-24 DIAGNOSIS — J410 Simple chronic bronchitis: Secondary | ICD-10-CM

## 2017-11-24 DIAGNOSIS — Z720 Tobacco use: Secondary | ICD-10-CM

## 2017-11-24 DIAGNOSIS — J441 Chronic obstructive pulmonary disease with (acute) exacerbation: Principal | ICD-10-CM

## 2017-11-24 LAB — I-STAT CHEM 8, ED
BUN: 5 mg/dL — AB (ref 6–20)
CHLORIDE: 104 mmol/L (ref 101–111)
Calcium, Ion: 0.99 mmol/L — ABNORMAL LOW (ref 1.15–1.40)
Creatinine, Ser: 0.6 mg/dL — ABNORMAL LOW (ref 0.61–1.24)
Glucose, Bld: 76 mg/dL (ref 65–99)
HEMATOCRIT: 37 % — AB (ref 39.0–52.0)
Hemoglobin: 12.6 g/dL — ABNORMAL LOW (ref 13.0–17.0)
Potassium: 6.9 mmol/L (ref 3.5–5.1)
Sodium: 136 mmol/L (ref 135–145)
TCO2: 26 mmol/L (ref 22–32)

## 2017-11-24 LAB — POTASSIUM: Potassium: 4.5 mmol/L (ref 3.5–5.1)

## 2017-11-24 MED ORDER — THIAMINE HCL 100 MG/ML IJ SOLN
100.0000 mg | Freq: Every day | INTRAMUSCULAR | Status: DC
  Administered 2017-11-24: 100 mg via INTRAVENOUS
  Filled 2017-11-24: qty 2

## 2017-11-24 MED ORDER — LORAZEPAM 1 MG PO TABS: 0.0000 mg | ORAL_TABLET | Freq: Two times a day (BID) | ORAL | Status: DC

## 2017-11-24 MED ORDER — SODIUM CHLORIDE 0.9 % IV BOLUS (SEPSIS)
1000.0000 mL | Freq: Once | INTRAVENOUS | Status: AC
  Administered 2017-11-24: 1000 mL via INTRAVENOUS

## 2017-11-24 MED ORDER — MOMETASONE FURO-FORMOTEROL FUM 100-5 MCG/ACT IN AERO
2.0000 | INHALATION_SPRAY | Freq: Two times a day (BID) | RESPIRATORY_TRACT | Status: DC
  Administered 2017-11-24: 2 via RESPIRATORY_TRACT
  Filled 2017-11-24: qty 8.8

## 2017-11-24 MED ORDER — IPRATROPIUM-ALBUTEROL 0.5-2.5 (3) MG/3ML IN SOLN: 3.0000 mL | Freq: Four times a day (QID) | RESPIRATORY_TRACT | Status: DC | PRN

## 2017-11-24 MED ORDER — CYCLOBENZAPRINE HCL 10 MG PO TABS: 10.0000 mg | ORAL_TABLET | Freq: Two times a day (BID) | ORAL | Status: DC | PRN

## 2017-11-24 MED ORDER — CHLORDIAZEPOXIDE HCL 25 MG PO CAPS: 25.0000 mg | ORAL_CAPSULE | ORAL | Status: DC

## 2017-11-24 MED ORDER — THIAMINE HCL 100 MG/ML IJ SOLN
Freq: Once | INTRAVENOUS | Status: AC
  Administered 2017-11-24: 03:00:00 via INTRAVENOUS
  Filled 2017-11-24: qty 1000

## 2017-11-24 MED ORDER — CITALOPRAM HYDROBROMIDE 20 MG PO TABS
40.0000 mg | ORAL_TABLET | Freq: Every day | ORAL | Status: DC
  Administered 2017-11-24 – 2017-11-26 (×3): 40 mg via ORAL
  Filled 2017-11-24 (×3): qty 2

## 2017-11-24 MED ORDER — ACETAMINOPHEN 325 MG PO TABS
650.0000 mg | ORAL_TABLET | Freq: Once | ORAL | Status: AC
  Administered 2017-11-24: 650 mg via ORAL
  Filled 2017-11-24: qty 2

## 2017-11-24 MED ORDER — ONDANSETRON HCL 4 MG PO TABS: 4.0000 mg | ORAL_TABLET | Freq: Four times a day (QID) | ORAL | Status: DC | PRN

## 2017-11-24 MED ORDER — ONDANSETRON HCL 4 MG/2ML IJ SOLN
4.0000 mg | Freq: Once | INTRAMUSCULAR | Status: AC
  Administered 2017-11-24: 4 mg via INTRAVENOUS
  Filled 2017-11-24: qty 2

## 2017-11-24 MED ORDER — BUDESONIDE 0.5 MG/2ML IN SUSP
0.5000 mg | Freq: Two times a day (BID) | RESPIRATORY_TRACT | Status: DC
  Administered 2017-11-24 (×2): 0.5 mg via RESPIRATORY_TRACT
  Filled 2017-11-24 (×3): qty 2

## 2017-11-24 MED ORDER — ACETAMINOPHEN 325 MG PO TABS: 650.0000 mg | ORAL_TABLET | Freq: Four times a day (QID) | ORAL | Status: DC | PRN

## 2017-11-24 MED ORDER — FUROSEMIDE 20 MG PO TABS
20.0000 mg | ORAL_TABLET | Freq: Every day | ORAL | Status: DC
Start: 2017-11-24 — End: 2017-11-26
  Administered 2017-11-24 – 2017-11-26 (×3): 20 mg via ORAL
  Filled 2017-11-24 (×3): qty 1

## 2017-11-24 MED ORDER — METHYLPREDNISOLONE SODIUM SUCC 125 MG IJ SOLR
125.0000 mg | Freq: Once | INTRAMUSCULAR | Status: AC
  Administered 2017-11-24: 125 mg via INTRAVENOUS
  Filled 2017-11-24: qty 2

## 2017-11-24 MED ORDER — VITAMIN B-1 100 MG PO TABS: 100.0000 mg | ORAL_TABLET | Freq: Every day | ORAL | Status: DC

## 2017-11-24 MED ORDER — ALBUTEROL SULFATE (2.5 MG/3ML) 0.083% IN NEBU: 2.5000 mg | INHALATION_SOLUTION | RESPIRATORY_TRACT | Status: DC | PRN

## 2017-11-24 MED ORDER — SENNOSIDES-DOCUSATE SODIUM 8.6-50 MG PO TABS: 1.0000 | ORAL_TABLET | Freq: Every evening | ORAL | Status: DC | PRN

## 2017-11-24 MED ORDER — LORAZEPAM 2 MG/ML IJ SOLN: 0.0000 mg | Freq: Two times a day (BID) | INTRAMUSCULAR | Status: DC

## 2017-11-24 MED ORDER — SODIUM CHLORIDE 0.9 % IV SOLN
INTRAVENOUS | Status: AC
  Administered 2017-11-24 – 2017-11-25 (×2): via INTRAVENOUS

## 2017-11-24 MED ORDER — CHLORDIAZEPOXIDE HCL 25 MG PO CAPS
25.0000 mg | ORAL_CAPSULE | Freq: Four times a day (QID) | ORAL | Status: AC
  Administered 2017-11-24 – 2017-11-25 (×4): 25 mg via ORAL
  Filled 2017-11-24 (×6): qty 1

## 2017-11-24 MED ORDER — IPRATROPIUM-ALBUTEROL 0.5-2.5 (3) MG/3ML IN SOLN
3.0000 mL | Freq: Four times a day (QID) | RESPIRATORY_TRACT | Status: DC
  Administered 2017-11-24: 3 mL via RESPIRATORY_TRACT

## 2017-11-24 MED ORDER — LORAZEPAM 1 MG PO TABS
0.0000 mg | ORAL_TABLET | Freq: Four times a day (QID) | ORAL | Status: DC
  Administered 2017-11-24: 1 mg via ORAL
  Administered 2017-11-24: 2 mg via ORAL
  Filled 2017-11-24: qty 1
  Filled 2017-11-24: qty 2
  Filled 2017-11-24: qty 1

## 2017-11-24 MED ORDER — METHYLPREDNISOLONE SODIUM SUCC 40 MG IJ SOLR
40.0000 mg | Freq: Two times a day (BID) | INTRAMUSCULAR | Status: DC
  Administered 2017-11-24 – 2017-11-26 (×4): 40 mg via INTRAVENOUS
  Filled 2017-11-24 (×4): qty 1

## 2017-11-24 MED ORDER — RIVAROXABAN 15 MG PO TABS
15.0000 mg | ORAL_TABLET | Freq: Once | ORAL | Status: AC
  Administered 2017-11-24: 15 mg via ORAL
  Filled 2017-11-24: qty 1

## 2017-11-24 MED ORDER — THIAMINE HCL 100 MG/ML IJ SOLN: 100.0000 mg | Freq: Every day | INTRAMUSCULAR | Status: DC

## 2017-11-24 MED ORDER — LORAZEPAM 2 MG/ML IJ SOLN
1.0000 mg | Freq: Four times a day (QID) | INTRAMUSCULAR | Status: DC | PRN
  Administered 2017-11-25: 1 mg via INTRAVENOUS
  Filled 2017-11-24 (×2): qty 1

## 2017-11-24 MED ORDER — IPRATROPIUM-ALBUTEROL 0.5-2.5 (3) MG/3ML IN SOLN: 3.0000 mL | RESPIRATORY_TRACT | Status: DC | PRN

## 2017-11-24 MED ORDER — HYDROXYZINE HCL 25 MG PO TABS
25.0000 mg | ORAL_TABLET | Freq: Three times a day (TID) | ORAL | Status: DC | PRN
  Administered 2017-11-24: 25 mg via ORAL
  Filled 2017-11-24: qty 1

## 2017-11-24 MED ORDER — ACETAMINOPHEN 650 MG RE SUPP: 650.0000 mg | Freq: Four times a day (QID) | RECTAL | Status: DC | PRN

## 2017-11-24 MED ORDER — CHLORDIAZEPOXIDE HCL 25 MG PO CAPS: 25.0000 mg | ORAL_CAPSULE | Freq: Every day | ORAL | Status: DC

## 2017-11-24 MED ORDER — ONDANSETRON HCL 4 MG/2ML IJ SOLN: 4.0000 mg | Freq: Four times a day (QID) | INTRAMUSCULAR | Status: DC | PRN

## 2017-11-24 MED ORDER — FOLIC ACID 1 MG PO TABS
1.0000 mg | ORAL_TABLET | Freq: Every day | ORAL | Status: DC
  Administered 2017-11-25 – 2017-11-26 (×2): 1 mg via ORAL
  Filled 2017-11-24 (×2): qty 1

## 2017-11-24 MED ORDER — LORAZEPAM 1 MG PO TABS
1.0000 mg | ORAL_TABLET | Freq: Four times a day (QID) | ORAL | Status: DC | PRN
  Administered 2017-11-25: 1 mg via ORAL
  Filled 2017-11-24: qty 1

## 2017-11-24 MED ORDER — LORAZEPAM 2 MG/ML IJ SOLN
0.0000 mg | Freq: Four times a day (QID) | INTRAMUSCULAR | Status: DC
  Administered 2017-11-24: 4 mg via INTRAVENOUS
  Administered 2017-11-24: 2 mg via INTRAVENOUS
  Filled 2017-11-24: qty 1
  Filled 2017-11-24: qty 2

## 2017-11-24 MED ORDER — ADULT MULTIVITAMIN W/MINERALS CH
1.0000 | ORAL_TABLET | Freq: Every day | ORAL | Status: DC
  Administered 2017-11-24 – 2017-11-26 (×3): 1 via ORAL
  Filled 2017-11-24 (×3): qty 1

## 2017-11-24 MED ORDER — ALBUTEROL SULFATE HFA 108 (90 BASE) MCG/ACT IN AERS: 2.0000 | INHALATION_SPRAY | Freq: Four times a day (QID) | RESPIRATORY_TRACT | Status: DC | PRN

## 2017-11-24 MED ORDER — CHLORDIAZEPOXIDE HCL 25 MG PO CAPS
25.0000 mg | ORAL_CAPSULE | Freq: Three times a day (TID) | ORAL | Status: DC
  Administered 2017-11-25 – 2017-11-26 (×2): 25 mg via ORAL
  Filled 2017-11-24 (×2): qty 1

## 2017-11-24 MED ORDER — DEXTROSE-NACL 5-0.45 % IV SOLN
INTRAVENOUS | Status: DC
  Administered 2017-11-24: 10:00:00 via INTRAVENOUS

## 2017-11-24 MED ORDER — TIOTROPIUM BROMIDE MONOHYDRATE 18 MCG IN CAPS
18.0000 ug | ORAL_CAPSULE | Freq: Every day | RESPIRATORY_TRACT | Status: DC
  Filled 2017-11-24: qty 5

## 2017-11-24 MED ORDER — VITAMIN B-1 100 MG PO TABS
100.0000 mg | ORAL_TABLET | Freq: Every day | ORAL | Status: DC
  Administered 2017-11-25 – 2017-11-26 (×2): 100 mg via ORAL
  Filled 2017-11-24 (×2): qty 1

## 2017-11-24 MED ORDER — LORAZEPAM 2 MG/ML IJ SOLN
1.0000 mg | Freq: Once | INTRAMUSCULAR | Status: AC
  Administered 2017-11-24: 1 mg via INTRAVENOUS
  Filled 2017-11-24: qty 1

## 2017-11-24 NOTE — Care Management Note (Signed)
Case Management Note  CM noted pt was not D/C last night as planned.  Pt is being admitted at this time.  CM returned albuterol and xarelto starter pack to [facility] and advised them if they are reordered on admission D/C someone will come back to pick them up again and whatever other medications are required at that time.  Updated inpt CM on pt.

## 2017-11-24 NOTE — Progress Notes (Signed)
Patient was transferred from ED at 1452. Alert and oriented x 3. Vital sign was taken.

## 2017-11-24 NOTE — ED Provider Notes (Signed)
Patient seen and evaluated.  ER chart reviewed in its entirety.  Patient presented last night after a fall.  Reassuring evaluation.  Hypoxemic at rest or sleeping.  Not when awake.  Does get hypoxemic ambulating.  Wheezing now.  Negative CTA.  At rest he is 86% sleeping.  Is awake him up and sit him up his saturations are 94.  He is tremulous.  He had a C with 22 before last dose of Ativan 2 mg.  Improved to 11.  Now greater than 20 again tachycardia.  Requiring additional neb.  Patient presented after falling.  He is hypoxemic when ambulating.  He is anticoagulated with Xarelto.  I will speak with hospitalist regarding admission.   Tanna Furry, MD 11/24/17 [phone]

## 2017-11-24 NOTE — ED Notes (Signed)
Pt was able to ambulate without assistance. He stated he felt off balance and weak. Pt gait reflected this. Pts tremors became more pronounced as well while sitting and standing. Pts RA sat while sitting up was still between 88-91%. PA notified and orders placed.

## 2017-11-24 NOTE — H&P (Signed)
History and Physical    [name]:[id number] DOB: 10/14/1965 DOA: 11/23/2017  PCP: Charlott Rakes, MD Patient coming from: Home  Chief Complaint: Left lower extremity weakness and mild shortness of breath  HPI: [name] is a 52 y.o. male with medical history significant of medication noncompliance, left lower extremity DVT used to be on Xarelto, daily alcohol use, tobacco use comes to the hospital for evaluation for weakness and mild shortness of breath.  History is limited somewhat at this point as he was showing signs of withdrawal from alcohol therefore received IV Ativan and he is currently drowsy. Apparently patient came to the hospital with complaining of weakness and states his DVT was acting up?  He has not been taking his Xarelto for several months.  His ultrasound couple of months ago was negative for any DVT.  He continues to smoke tobacco daily but unable to tell me how much and drinks 24 beers every day for at least 10 years.  Denies any fevers, chills and other complaints. In the ER patient was noted to be hypoxic saturating 85% on room air and had diffuse wheezing and coarse breath sounds.  He was given IV Solu-Medrol and nebulizer treatments along with supplemental oxygen.  Initially was hyperkalemic but repeat labs showed improvement in potassium.  At first she was intoxicated but as he sobered up he started showing signs of withdrawal with tachycardia and elevated blood pressure therefore he was given IV Ativan.  He was admitted for monitoring for alcohol withdrawal and hypoxia.   Review of Systems: As per HPI otherwise 10 point review of systems negative.  Review of Systems  Limited history Due to patient's drowsiness after getting 4 mg of Ativan at this time  Past Medical History:  Diagnosis Date  . Collagen vascular disease ([place])   . COPD (chronic obstructive pulmonary disease) ([place])   . Coronary artery disease   . Degenerative joint disease   . Degenerative  joint disease of spine   . Depression   . DVT (deep venous thrombosis) ([place])   . ETOH abuse   . Hepatitis B   . Lupus anticoagulant disorder ([place]) 02/02/2012  . Pulmonary embolism ([place]) 06/2010    Past Surgical History:  Procedure Laterality Date  . LEFT ELBOW SURGERY    . TONSILLECTOMY       reports that he has been smoking cigarettes.  He has a 60.00 pack-year smoking history. he has never used smokeless tobacco. He reports that he drinks about 14.4 oz of alcohol per week. He reports that he does not use drugs.  No Known Allergies  Family History  Problem Relation Age of Onset  . Cancer Mother        Ovarian     Prior to Admission medications   Medication Sig Start Date End Date Taking? Authorizing Provider  Aspirin-Salicylamide-Caffeine (BC HEADACHE PO) Take 1 packet by mouth 2 (two) times daily as needed (pain).   Yes [provider]  albuterol (PROVENTIL HFA;VENTOLIN HFA) 108 (90 Base) MCG/ACT inhaler Inhale 2 puffs into the lungs every 6 (six) hours as needed for wheezing or shortness of breath. 11/23/17   Pisciotta, Elmyra Ricks, PA-C  citalopram (CELEXA) 40 MG tablet Take 1 tablet (40 mg total) by mouth daily. 02/16/17   Charlott Rakes, MD  cyclobenzaprine (FLEXERIL) 10 MG tablet Take 1 tablet (10 mg total) by mouth 2 (two) times daily as needed for muscle spasms. 02/16/17   Charlott Rakes, MD  furosemide (LASIX) 20 MG tablet  Take 1 tablet (20 mg total) by mouth daily. 02/16/17   Charlott Rakes, MD  hydrOXYzine (ATARAX/VISTARIL) 25 MG tablet Take 1 tablet (25 mg total) by mouth 3 (three) times daily as needed for anxiety. 02/16/17   Charlott Rakes, MD  mometasone-formoterol (DULERA) 100-5 MCG/ACT AERO Inhale 2 puffs into the lungs 2 (two) times daily. 02/16/17   Charlott Rakes, MD  Rivaroxaban 15 & 20 MG TBPK Take as directed on package: Start with one 15mg  tablet by mouth twice a day with food. On Day 22, switch to one 20mg  tablet once a day with food. 11/23/17    Pisciotta, Elmyra Ricks, PA-C  tiotropium (SPIRIVA HANDIHALER) 18 MCG inhalation capsule Place 1 capsule (18 mcg total) into inhaler and inhale daily. 11/23/17   PisciottaElmyra Ricks, PA-C    Physical Exam: Vitals:   11/24/17 0630 11/24/17 0800 11/24/17 0830 11/24/17 0936  BP: (!) 140/92 (!) 149/88 (!) 170/92 (!) 170/97  Pulse: 82 88 (!) 107 98  Resp: 17 14 16    Temp:      TempSrc:      SpO2: 92%  97%   Weight:      Height:          Constitutional: Currently drowsy since he has been medicated with Ativan, poor hygiene Vitals:   11/24/17 0630 11/24/17 0800 11/24/17 0830 11/24/17 0936  BP: (!) 140/92 (!) 149/88 (!) 170/92 (!) 170/97  Pulse: 82 88 (!) 107 98  Resp: 17 14 16    Temp:      TempSrc:      SpO2: 92%  97%   Weight:      Height:       Eyes: PERRL, lids and conjunctivae normal ENMT: Mucous membranes are dry. Posterior pharynx clear of any exudate or lesions.Normal dentition.  Neck: normal, supple, no masses, no thyromegaly Respiratory: Mild diffuse coarse breath sounds Cardiovascular: Regular rate and rhythm, no murmurs / rubs / gallops. No extremity edema. 2+ pedal pulses. No carotid bruits.  Abdomen: no tenderness, no masses palpated. No hepatosplenomegaly. Bowel sounds positive.  Musculoskeletal: no clubbing / cyanosis. No joint deformity upper and lower extremities. Good ROM, no contractures. Normal muscle tone.  Skin: no rashes, lesions, ulcers. No induration Neurologic: CN 2-12 grossly intact. Sensation intact, DTR normal. Strength 5/5 in all 4.  No focal neuro deficits Psychiatric: Normal judgment and insight. Alert and oriented x 3. Normal mood.     Labs on Admission: I have personally reviewed following labs and imaging studies  CBC: Recent Labs  Lab 11/23/17 1856 11/23/17 1904 11/24/17 0623  WBC 4.5  --   --   NEUTROABS 2.4  --   --   HGB 14.3 15.6 12.6*  HCT 43.0 46.0 37.0*  MCV 106.4*  --   --   PLT 120*  --   --    Basic Metabolic Panel: Recent Labs   Lab 11/23/17 1856 11/23/17 1904 11/24/17 0623 11/24/17 0805  NA 143 141 136  --   K 4.1 4.1 6.9* 4.5  CL 106 103 104  --   CO2 26  --   --   --   GLUCOSE 87 85 76  --   BUN 5* <3* 5*  --   CREATININE 0.67 1.00 0.60*  --   CALCIUM 9.1  --   --   --   MG 1.7  --   --   --    GFR: Estimated Creatinine Clearance: 136.3 mL/min (A) (by C-G formula based on SCr  of 0.6 mg/dL (L)). Liver Function Tests: Recent Labs  Lab 11/23/17 1856  AST 139*  ALT 73*  ALKPHOS 100  BILITOT 0.2*  PROT 7.1  ALBUMIN 3.7   No results for input(s): LIPASE, AMYLASE in the last 168 hours. No results for input(s): AMMONIA in the last 168 hours. Coagulation Profile: Recent Labs  Lab 11/23/17 1856  INR 0.85   Cardiac Enzymes: Recent Labs  Lab 11/23/17 1856  TROPONINI <0.03   BNP (last 3 results) No results for input(s): PROBNP in the last 8760 hours. HbA1C: No results for input(s): HGBA1C in the last 72 hours. CBG: No results for input(s): GLUCAP in the last 168 hours. Lipid Profile: No results for input(s): CHOL, HDL, LDLCALC, TRIG, CHOLHDL, LDLDIRECT in the last 72 hours. Thyroid Function Tests: No results for input(s): TSH, T4TOTAL, FREET4, T3FREE, THYROIDAB in the last 72 hours. Anemia Panel: No results for input(s): VITAMINB12, FOLATE, FERRITIN, TIBC, IRON, RETICCTPCT in the last 72 hours. Urine analysis:    Component Value Date/Time   COLORURINE YELLOW 02/09/2016 [place] 02/09/2016 0553   LABSPEC >1.046 (H) 02/09/2016 0553   PHURINE 5.0 02/09/2016 0553   GLUCOSEU NEGATIVE 02/09/2016 0553   HGBUR NEGATIVE 02/09/2016 0553   BILIRUBINUR NEGATIVE 02/09/2016 0553   KETONESUR NEGATIVE 02/09/2016 0553   PROTEINUR NEGATIVE 02/09/2016 0553   UROBILINOGEN 0.2 04/25/2015 0245   NITRITE NEGATIVE 02/09/2016 0553   LEUKOCYTESUR NEGATIVE 02/09/2016 0553   Sepsis Labs: !!!!!!!!!!!!!!!!!!!!!!!!!!!!!!!!!!!!!!!!!!!! @LABRCNTIP (procalcitonin:4,lacticidven:4) )No results  found for this or any previous visit (from the past 240 hour(s)).   Radiological Exams on Admission: Ct Head Wo Contrast  Result Date: 11/23/2017 CLINICAL DATA:  Multiple falls shortness of breath EXAM: CT HEAD WITHOUT CONTRAST CT CERVICAL SPINE WITHOUT CONTRAST TECHNIQUE: Multidetector CT imaging of the head and cervical spine was performed following the standard protocol without intravenous contrast. Multiplanar CT image reconstructions of the cervical spine were also generated. COMPARISON:  Head CT 03/05/2013, CT cervical spine 08/04/2012 FINDINGS: CT HEAD FINDINGS Brain: No acute territorial infarction, hemorrhage, or intracranial mass is visualized. Moderate atrophy. Stable ventricle size. Enlarged retro cerebellar CSF density unchanged, possible arachnoid cyst. Mild small vessel ischemic changes of the white matter. Vascular: No hyperdense vessels.  No unexpected calcification Skull: No fracture Sinuses/Orbits: Mucosal thickening and retention cysts in the left sphenoid sinus. No acute orbital abnormality Other: None CT CERVICAL SPINE FINDINGS Alignment: Mild reversal of cervical lordosis. No subluxation. Facet alignment within normal limits. Skull base and vertebrae: No acute fracture. No primary bone lesion or focal pathologic process. Soft tissues and spinal canal: No prevertebral fluid or swelling. No visible canal hematoma. Disc levels:  Mild degenerative changes at C4-C5, C5-C6 and C6-C7. Upper chest: Mild apical emphysema. Other: None IMPRESSION: 1. No CT evidence for acute intracranial abnormality. Atrophy, prominent for age. Mild small vessel ischemic changes of the white matter. 2. Reversal of cervical lordosis with degenerative changes. No acute osseous abnormality. Electronically Signed   By: Donavan Foil M.D.   On: 11/23/2017 20:03   Ct Angio Chest Pe W And/or Wo Contrast  Result Date: 11/23/2017 CLINICAL DATA:  Chronic dyspnea, multiple falls over past 3 weeks, shortness of breath,  wheezing, history COPD, collagen vascular disease, pulmonary embolism, smoker EXAM: CT ANGIOGRAPHY CHEST WITH CONTRAST TECHNIQUE: Multidetector CT imaging of the chest was performed using the standard protocol during bolus administration of intravenous contrast. Multiplanar CT image reconstructions and MIPs were obtained to evaluate the vascular anatomy. CONTRAST:  113mL ISOVUE-370 IOPAMIDOL (ISOVUE-370)  INJECTION 76% IV COMPARISON:  12/21/2016 FINDINGS: Cardiovascular: Minimal atherosclerotic calcification aorta. Aorta normal caliber without aneurysm or dissection. No pericardial effusion. Scattered respiratory motion artifacts in the lower lobes. Pulmonary arteries adequately opacified and patent. No evidence of pulmonary embolism. Mediastinum/Nodes: Esophagus unremarkable. Base of cervical region normal appearance. No thoracic adenopathy. Lungs/Pleura: Calcified granuloma at superior segment LEFT lower lobe. Question atelectasis in medial RIGHT upper lobe posteriorly. No pulmonary infiltrate, pleural effusion or pneumothorax. Upper Abdomen: Diffuse fatty infiltration of liver. Remaining visualized upper abdomen unremarkable. Musculoskeletal: No acute osseous findings. Review of the MIP images confirms the above findings. IMPRESSION: No evidence of pulmonary embolism. Old granulomatous disease. Subsegmental atelectasis RIGHT upper lobe. Hepatic steatosis. Aortic Atherosclerosis (ICD10-I70.0). Electronically Signed   By: Lavonia Dana M.D.   On: 11/23/2017 20:01   Ct Cervical Spine Wo Contrast  Result Date: 11/23/2017 CLINICAL DATA:  Multiple falls shortness of breath EXAM: CT HEAD WITHOUT CONTRAST CT CERVICAL SPINE WITHOUT CONTRAST TECHNIQUE: Multidetector CT imaging of the head and cervical spine was performed following the standard protocol without intravenous contrast. Multiplanar CT image reconstructions of the cervical spine were also generated. COMPARISON:  Head CT 03/05/2013, CT cervical spine 08/04/2012  FINDINGS: CT HEAD FINDINGS Brain: No acute territorial infarction, hemorrhage, or intracranial mass is visualized. Moderate atrophy. Stable ventricle size. Enlarged retro cerebellar CSF density unchanged, possible arachnoid cyst. Mild small vessel ischemic changes of the white matter. Vascular: No hyperdense vessels.  No unexpected calcification Skull: No fracture Sinuses/Orbits: Mucosal thickening and retention cysts in the left sphenoid sinus. No acute orbital abnormality Other: None CT CERVICAL SPINE FINDINGS Alignment: Mild reversal of cervical lordosis. No subluxation. Facet alignment within normal limits. Skull base and vertebrae: No acute fracture. No primary bone lesion or focal pathologic process. Soft tissues and spinal canal: No prevertebral fluid or swelling. No visible canal hematoma. Disc levels:  Mild degenerative changes at C4-C5, C5-C6 and C6-C7. Upper chest: Mild apical emphysema. Other: None IMPRESSION: 1. No CT evidence for acute intracranial abnormality. Atrophy, prominent for age. Mild small vessel ischemic changes of the white matter. 2. Reversal of cervical lordosis with degenerative changes. No acute osseous abnormality. Electronically Signed   By: Donavan Foil M.D.   On: 11/23/2017 20:03   Dg Knee Complete 4 Views Right  Result Date: 11/23/2017 CLINICAL DATA:  Right anterior knee pain. Multiple falls over the past 3 weeks. Initial encounter. EXAM: RIGHT KNEE - COMPLETE 4+ VIEW COMPARISON:  None. FINDINGS: There is no evidence of acute fracture, dislocation, or knee joint effusion. Joint space widths are preserved without significant arthropathic changes identified. A few small soft tissue calcifications are noted in the lower leg. IMPRESSION: No acute osseous abnormality. Electronically Signed   By: Logan Bores M.D.   On: 11/23/2017 18:16    EKG: Independently reviewed.  No acute ST-T changes  Assessment/Plan Active Problems:   Hypoxia    Acute hypoxic respiratory failure,  86% room air Acute mild to moderate exacerbation of mild persistent COPD - We will admit the patient for observation -Nebulizers standing and as needed ordered, IV Solu-Medrol which we will eventually transition to oral prednisone -Pulmicort twice daily -CT angios done in the ER is negative for PE.  Shows subsegmental right upper lobe atelectasis -Antibiotics= none at this time - Antitussives as needed -Supplemental oxygen as needed, check ambulatory pulse ox if needed - Continue to provide supportive care  Alcohol intoxication with high risk for withdrawal -Last night he was intoxicated but this morning he is  showing some signs of withdrawal in the ER.  Apparently his CIWA score was greater than 15 therefore Ativan was given but since then he appears to be calm -CIWA protocol, vitamins ordered -Started on Librium taper protocol -Counseled to quit using alcohol  Left lower extremity DVT -Used to be on Xarelto.  Lower extremity ultrasound in January was negative for DVT therefore not on anticoagulation anymore  Medication noncompliance -He has not taken several of his medication in several weeks.   DVT prophylaxis: Early Ambulation  Code Status: Full  Family Communication: None at bedside  Disposition Plan:  Once hypoxia has resolved he can likely be discharged with Librium taper  Consults called: None Admission status: Obs tele    Hilma Steinhilber Arsenio Loader MD [facility] Pager [phone]  If 7PM-7AM, please contact night-coverage [email or web address] Password [facility]  11/24/2017, 10:36 AM

## 2017-11-24 NOTE — ED Notes (Signed)
Pt is still refusing to wear his oxygen. Pt removes oxygen when RN leaves the room.

## 2017-11-24 NOTE — ED Notes (Signed)
Pt had taken off equipment and oxygen. Everything was reapplied.

## 2017-11-24 NOTE — Progress Notes (Signed)
Pt has 2 of his homeless friends in the room with him , they were trying to arouse pt and were asked to let pt sleep due to medications given. Thus far they are cooperating. State pt asked them to come stay with him.

## 2017-11-24 NOTE — ED Notes (Signed)
Waisted 1 mg of Ativan in sink. Merideth Abbey, RN witnessed.

## 2017-11-25 DIAGNOSIS — E875 Hyperkalemia: Secondary | ICD-10-CM | POA: Diagnosis present

## 2017-11-25 DIAGNOSIS — R296 Repeated falls: Secondary | ICD-10-CM | POA: Diagnosis present

## 2017-11-25 DIAGNOSIS — F411 Generalized anxiety disorder: Secondary | ICD-10-CM | POA: Diagnosis present

## 2017-11-25 DIAGNOSIS — I825Y2 Chronic embolism and thrombosis of unspecified deep veins of left proximal lower extremity: Secondary | ICD-10-CM

## 2017-11-25 DIAGNOSIS — R062 Wheezing: Secondary | ICD-10-CM | POA: Diagnosis not present

## 2017-11-25 DIAGNOSIS — I82402 Acute embolism and thrombosis of unspecified deep veins of left lower extremity: Secondary | ICD-10-CM | POA: Diagnosis present

## 2017-11-25 DIAGNOSIS — F419 Anxiety disorder, unspecified: Secondary | ICD-10-CM | POA: Diagnosis present

## 2017-11-25 DIAGNOSIS — J9601 Acute respiratory failure with hypoxia: Secondary | ICD-10-CM

## 2017-11-25 DIAGNOSIS — F10229 Alcohol dependence with intoxication, unspecified: Secondary | ICD-10-CM | POA: Diagnosis present

## 2017-11-25 DIAGNOSIS — I2510 Atherosclerotic heart disease of native coronary artery without angina pectoris: Secondary | ICD-10-CM | POA: Diagnosis present

## 2017-11-25 DIAGNOSIS — F10239 Alcohol dependence with withdrawal, unspecified: Secondary | ICD-10-CM | POA: Diagnosis present

## 2017-11-25 DIAGNOSIS — Z9114 Patient's other noncompliance with medication regimen: Secondary | ICD-10-CM | POA: Diagnosis not present

## 2017-11-25 DIAGNOSIS — J441 Chronic obstructive pulmonary disease with (acute) exacerbation: Secondary | ICD-10-CM | POA: Diagnosis not present

## 2017-11-25 DIAGNOSIS — Z7901 Long term (current) use of anticoagulants: Secondary | ICD-10-CM | POA: Diagnosis not present

## 2017-11-25 DIAGNOSIS — F17210 Nicotine dependence, cigarettes, uncomplicated: Secondary | ICD-10-CM | POA: Diagnosis present

## 2017-11-25 DIAGNOSIS — D6862 Lupus anticoagulant syndrome: Secondary | ICD-10-CM | POA: Diagnosis present

## 2017-11-25 DIAGNOSIS — Z86711 Personal history of pulmonary embolism: Secondary | ICD-10-CM | POA: Diagnosis not present

## 2017-11-25 LAB — COMPREHENSIVE METABOLIC PANEL
ALBUMIN: 3 g/dL — AB (ref 3.5–5.0)
ALK PHOS: 81 U/L (ref 38–126)
ALT: 58 U/L (ref 17–63)
AST: 108 U/L — AB (ref 15–41)
Anion gap: 11 (ref 5–15)
BUN: 8 mg/dL (ref 6–20)
CALCIUM: 8.1 mg/dL — AB (ref 8.9–10.3)
CO2: 25 mmol/L (ref 22–32)
CREATININE: 0.62 mg/dL (ref 0.61–1.24)
Chloride: 104 mmol/L (ref 101–111)
GFR calc non Af Amer: >60 mL/min (ref >60)
GLUCOSE: 138 mg/dL — AB (ref 65–99)
Potassium: 3.8 mmol/L (ref 3.5–5.1)
SODIUM: 140 mmol/L (ref 135–145)
Total Bilirubin: 0.6 mg/dL (ref 0.3–1.2)
Total Protein: 6.1 g/dL — ABNORMAL LOW (ref 6.5–8.1)

## 2017-11-25 LAB — HIV ANTIBODY (ROUTINE TESTING W REFLEX): HIV Screen 4th Generation wRfx: NONREACTIVE

## 2017-11-25 MED ORDER — HYDRALAZINE HCL 20 MG/ML IJ SOLN: 10.0000 mg | Freq: Four times a day (QID) | INTRAMUSCULAR | Status: DC | PRN

## 2017-11-25 MED ORDER — ENOXAPARIN SODIUM 40 MG/0.4ML ~~LOC~~ SOLN: 40.0000 mg | SUBCUTANEOUS | Status: DC

## 2017-11-25 MED ORDER — RIVAROXABAN 15 MG PO TABS
15.0000 mg | ORAL_TABLET | Freq: Two times a day (BID) | ORAL | Status: DC
  Administered 2017-11-25 – 2017-11-26 (×3): 15 mg via ORAL
  Filled 2017-11-25 (×3): qty 1

## 2017-11-25 NOTE — Progress Notes (Signed)
Smelled cigarette smoke coming from patient's room. Asked him if he had been smoking, pt stated that he hadn't. Found cigarette ashes on toilet. Informed patient that its a major safety hazard to smoke in hospitals and that if he is caught smoking again security will be called. Pt states that he understand. Will continue to monitor at this time.

## 2017-11-25 NOTE — Progress Notes (Signed)
PROGRESS NOTE [facility]   [name]   ZOX:[id number] DOB: [birth year]  DOA: 11/23/2017 PCP: [name] Rakes, MD    Brief Narrative:  [name] is a 52 year old male with medical history significant of left lower extremity DVT, daily alcohol and tobacco abuse, COPD and CAD who presented to the emergency department complaining of left lower extremity weakness and shortness of breath.  Upon ED evaluation patient was noted to be hypoxic saturating to the 85% with diffuse  wheezing and coarse breath sounds.  Patient was given Solu-Medrol and nebulizer treatments and was placed  on supplemental oxygen.  He also was found to be intoxicated with alcohol but  as he sobered up starting to show signs of alcohol withdrawal with tachycardia and elevated pressure, patient therefore was admitted with working diagnosis of COPD exacerbation and alcohol withdrawal symptoms.  Subjective: Patient seen and examined, he reports that he feels awful.  He have a headache, feels very nauseous and complaining of anxiety.  In the past patient has gone through DTs.  Patient reported that he wants to quit drinking but has been unable to.  Shortness of breath has improved and patient is on room air at this point.  Assessment & Plan: Acute hypoxic respiratory failure secondary to COPD exacerbation Initially requiring oxygen supplementation which has been  weaned off. Patient being treated with IV Solu-Medrol and nebulizer.  Patient refused nebulizer this morning. Will continue MDI for now. CT angiogram in the ED is negative for PE and shows subsegmental right upper lobe atelectasis Will add incentive spirometry Continue supportive care  Alcohol intoxication with signs of alcohol withdrawal CIWA protocol - score done by me 11 We will treat with Librium taper Patient interested in quitting will consult social worker and case manager for outside resources  Left lower extremity DVT Ultrasound done on 3/19 shows  left obstruction in the gastrocnemius vein ? acute versus chronic.  Will resume Xarelto at the starting dose given patient was not taken to follow-up  DVT prophylaxis: Xarelto Code Status: Full code Family Communication: None at bedside Disposition Plan: Home in a.m. if stable  Consultants:   None  Procedures:   None  Antimicrobials:  None   Objective: Vitals:   11/25/17 0424 11/25/17 0425 11/25/17 0834 11/25/17 1352  BP:  (!) 151/94 (!) 141/70 138/72  Pulse:  83 69 95  Resp:  20    Temp:  98.3 F (36.8 C) 98.1 F (36.7 C) 98.1 F (36.7 C)  TempSrc:  Oral Oral Oral  SpO2:  95% 96% 96%  Weight: 99.2 kg (218 lb 11.1 oz)     Height:        Intake/Output Summary (Last 24 hours) at 11/25/2017 1416 Last data filed at 11/25/2017 0602 Gross per 24 hour  Intake 4961.8 ml  Output 1200 ml  Net 3761.8 ml   Filed Weights   11/23/17 1640 11/25/17 0424  Weight: 99.8 kg (220 lb) 99.2 kg (218 lb 11.1 oz)    Examination:  General exam: Mild anxious HEENT: OP moist and clear, diaphoretic Respiratory system: Clear to auscultation. No wheezes,crackle or rhonchi Cardiovascular system: S1 & S2 heard, RRR. No JVD, murmurs, rubs or gallops Gastrointestinal system: Abdomen is nondistended, soft and nontender.  Central nervous system: Alert and oriented. No focal neurological deficits. Extremities: Tremors with extended hands, no edema Skin: No rashes Psychiatry: Mood & affect flat   Data Reviewed: I have personally reviewed following labs and imaging studies  CBC: Recent Labs  Lab 11/23/17  1856 11/23/17 1904 11/24/17 0623  WBC 4.5  --   --   NEUTROABS 2.4  --   --   HGB 14.3 15.6 12.6*  HCT 43.0 46.0 37.0*  MCV 106.4*  --   --   PLT 120*  --   --    Basic Metabolic Panel: Recent Labs  Lab 11/23/17 1856 11/23/17 1904 11/24/17 0623 11/24/17 0805 11/25/17 0605  NA 143 141 136  --  140  K 4.1 4.1 6.9* 4.5 3.8  CL 106 103 104  --  104  CO2 26  --   --   --  25    GLUCOSE 87 85 76  --  138*  BUN 5* <3* 5*  --  8  CREATININE 0.67 1.00 0.60*  --  0.62  CALCIUM 9.1  --   --   --  8.1*  MG 1.7  --   --   --   --    GFR: Estimated Creatinine Clearance: 136 mL/min (by C-G formula based on SCr of 0.62 mg/dL). Liver Function Tests: Recent Labs  Lab 11/23/17 1856 11/25/17 0605  AST 139* 108*  ALT 73* 58  ALKPHOS 100 81  BILITOT 0.2* 0.6  PROT 7.1 6.1*  ALBUMIN 3.7 3.0*   No results for input(s): LIPASE, AMYLASE in the last 168 hours. No results for input(s): AMMONIA in the last 168 hours. Coagulation Profile: Recent Labs  Lab 11/23/17 1856  INR 0.85   Cardiac Enzymes: Recent Labs  Lab 11/23/17 1856  TROPONINI <0.03   BNP (last 3 results) No results for input(s): PROBNP in the last 8760 hours. HbA1C: No results for input(s): HGBA1C in the last 72 hours. CBG: No results for input(s): GLUCAP in the last 168 hours. Lipid Profile: No results for input(s): CHOL, HDL, LDLCALC, TRIG, CHOLHDL, LDLDIRECT in the last 72 hours. Thyroid Function Tests: No results for input(s): TSH, T4TOTAL, FREET4, T3FREE, THYROIDAB in the last 72 hours. Anemia Panel: No results for input(s): VITAMINB12, FOLATE, FERRITIN, TIBC, IRON, RETICCTPCT in the last 72 hours. Sepsis Labs: No results for input(s): PROCALCITON, LATICACIDVEN in the last 168 hours.  No results found for this or any previous visit (from the past 240 hour(s)).    Radiology Studies: Ct Head Wo Contrast  Result Date: 11/23/2017 CLINICAL DATA:  Multiple falls shortness of breath EXAM: CT HEAD WITHOUT CONTRAST CT CERVICAL SPINE WITHOUT CONTRAST TECHNIQUE: Multidetector CT imaging of the head and cervical spine was performed following the standard protocol without intravenous contrast. Multiplanar CT image reconstructions of the cervical spine were also generated. COMPARISON:  Head CT 03/05/2013, CT cervical spine 08/04/2012 FINDINGS: CT HEAD FINDINGS Brain: No acute territorial infarction,  hemorrhage, or intracranial mass is visualized. Moderate atrophy. Stable ventricle size. Enlarged retro cerebellar CSF density unchanged, possible arachnoid cyst. Mild small vessel ischemic changes of the white matter. Vascular: No hyperdense vessels.  No unexpected calcification Skull: No fracture Sinuses/Orbits: Mucosal thickening and retention cysts in the left sphenoid sinus. No acute orbital abnormality Other: None CT CERVICAL SPINE FINDINGS Alignment: Mild reversal of cervical lordosis. No subluxation. Facet alignment within normal limits. Skull base and vertebrae: No acute fracture. No primary bone lesion or focal pathologic process. Soft tissues and spinal canal: No prevertebral fluid or swelling. No visible canal hematoma. Disc levels:  Mild degenerative changes at C4-C5, C5-C6 and C6-C7. Upper chest: Mild apical emphysema. Other: None IMPRESSION: 1. No CT evidence for acute intracranial abnormality. Atrophy, prominent for age. Mild small vessel ischemic changes  of the white matter. 2. Reversal of cervical lordosis with degenerative changes. No acute osseous abnormality. Electronically Signed   By: Donavan Foil M.D.   On: 11/23/2017 20:03   Ct Angio Chest Pe W And/or Wo Contrast  Result Date: 11/23/2017 CLINICAL DATA:  Chronic dyspnea, multiple falls over past 3 weeks, shortness of breath, wheezing, history COPD, collagen vascular disease, pulmonary embolism, smoker EXAM: CT ANGIOGRAPHY CHEST WITH CONTRAST TECHNIQUE: Multidetector CT imaging of the chest was performed using the standard protocol during bolus administration of intravenous contrast. Multiplanar CT image reconstructions and MIPs were obtained to evaluate the vascular anatomy. CONTRAST:  133mL ISOVUE-370 IOPAMIDOL (ISOVUE-370) INJECTION 76% IV COMPARISON:  12/21/2016 FINDINGS: Cardiovascular: Minimal atherosclerotic calcification aorta. Aorta normal caliber without aneurysm or dissection. No pericardial effusion. Scattered respiratory  motion artifacts in the lower lobes. Pulmonary arteries adequately opacified and patent. No evidence of pulmonary embolism. Mediastinum/Nodes: Esophagus unremarkable. Base of cervical region normal appearance. No thoracic adenopathy. Lungs/Pleura: Calcified granuloma at superior segment LEFT lower lobe. Question atelectasis in medial RIGHT upper lobe posteriorly. No pulmonary infiltrate, pleural effusion or pneumothorax. Upper Abdomen: Diffuse fatty infiltration of liver. Remaining visualized upper abdomen unremarkable. Musculoskeletal: No acute osseous findings. Review of the MIP images confirms the above findings. IMPRESSION: No evidence of pulmonary embolism. Old granulomatous disease. Subsegmental atelectasis RIGHT upper lobe. Hepatic steatosis. Aortic Atherosclerosis (ICD10-I70.0). Electronically Signed   By: Lavonia Dana M.D.   On: 11/23/2017 20:01   Ct Cervical Spine Wo Contrast  Result Date: 11/23/2017 CLINICAL DATA:  Multiple falls shortness of breath EXAM: CT HEAD WITHOUT CONTRAST CT CERVICAL SPINE WITHOUT CONTRAST TECHNIQUE: Multidetector CT imaging of the head and cervical spine was performed following the standard protocol without intravenous contrast. Multiplanar CT image reconstructions of the cervical spine were also generated. COMPARISON:  Head CT 03/05/2013, CT cervical spine 08/04/2012 FINDINGS: CT HEAD FINDINGS Brain: No acute territorial infarction, hemorrhage, or intracranial mass is visualized. Moderate atrophy. Stable ventricle size. Enlarged retro cerebellar CSF density unchanged, possible arachnoid cyst. Mild small vessel ischemic changes of the white matter. Vascular: No hyperdense vessels.  No unexpected calcification Skull: No fracture Sinuses/Orbits: Mucosal thickening and retention cysts in the left sphenoid sinus. No acute orbital abnormality Other: None CT CERVICAL SPINE FINDINGS Alignment: Mild reversal of cervical lordosis. No subluxation. Facet alignment within normal limits.  Skull base and vertebrae: No acute fracture. No primary bone lesion or focal pathologic process. Soft tissues and spinal canal: No prevertebral fluid or swelling. No visible canal hematoma. Disc levels:  Mild degenerative changes at C4-C5, C5-C6 and C6-C7. Upper chest: Mild apical emphysema. Other: None IMPRESSION: 1. No CT evidence for acute intracranial abnormality. Atrophy, prominent for age. Mild small vessel ischemic changes of the white matter. 2. Reversal of cervical lordosis with degenerative changes. No acute osseous abnormality. Electronically Signed   By: Donavan Foil M.D.   On: 11/23/2017 20:03   Dg Knee Complete 4 Views Right  Result Date: 11/23/2017 CLINICAL DATA:  Right anterior knee pain. Multiple falls over the past 3 weeks. Initial encounter. EXAM: RIGHT KNEE - COMPLETE 4+ VIEW COMPARISON:  None. FINDINGS: There is no evidence of acute fracture, dislocation, or knee joint effusion. Joint space widths are preserved without significant arthropathic changes identified. A few small soft tissue calcifications are noted in the lower leg. IMPRESSION: No acute osseous abnormality. Electronically Signed   By: Logan Bores M.D.   On: 11/23/2017 18:16      Scheduled Meds: . chlordiazePOXIDE  25 mg Oral  QID   Followed by  . chlordiazePOXIDE  25 mg Oral TID   Followed by  . [START ON 11/26/2017] chlordiazePOXIDE  25 mg Oral BH-qamhs   Followed by  . [START ON 11/27/2017] chlordiazePOXIDE  25 mg Oral Daily  . citalopram  40 mg Oral Daily  . folic acid  1 mg Oral Daily  . folic acid  1 mg Intravenous Daily  . furosemide  20 mg Oral Daily  . methylPREDNISolone (SOLU-MEDROL) injection  40 mg Intravenous Q12H  . mometasone-formoterol  2 puff Inhalation BID  . multivitamin with minerals  1 tablet Oral Daily  . Rivaroxaban  15 mg Oral BID WC  . thiamine  100 mg Oral Daily   Or  . thiamine  100 mg Intravenous Daily  . tiotropium  18 mcg Inhalation Daily   Continuous Infusions: . dextrose 5  % and 0.45% NaCl Stopped (11/24/17 1844)     LOS: 0 days    Time spent: Total of 25 minutes spent with pt, greater than 50% of which was spent in discussion of  treatment, counseling and coordination of care   Chipper Oman, MD Pager: Text Page via [email or web address]   If 7PM-7AM, please contact night-coverage [email or web address] 11/25/2017, 2:16 PM   Note - This record has been created using [employer]. Chart creation errors have been sought, but may not always have been located. Such creation errors do not reflect on the standard of medical care.

## 2017-11-26 DIAGNOSIS — F10920 Alcohol use, unspecified with intoxication, uncomplicated: Secondary | ICD-10-CM

## 2017-11-26 MED ORDER — ALBUTEROL SULFATE HFA 108 (90 BASE) MCG/ACT IN AERS: 2.0000 | INHALATION_SPRAY | Freq: Four times a day (QID) | RESPIRATORY_TRACT | 6 refills | Status: AC | PRN

## 2017-11-26 MED FILL — VENTOLIN HFA 90 MCG INHALER: 108 (90 BAS | 25 days supply | Qty: 18 | Fill #0

## 2017-11-26 MED FILL — XARELTO STARTER PACK: 15 & 20 | 30 days supply | Qty: 51 | Fill #0

## 2017-11-26 MED FILL — SPIRIVA 18 MCG CP-HANDIHALE: 18 | 30 days supply | Qty: 30 | Fill #0

## 2017-11-26 NOTE — Clinical Social Work Note (Addendum)
Clinical Social Work Assessment  Patient Details  Name: [name] MRN: [id number] Date of Birth: 07-Jun-1966  Date of referral:  11/26/17               Reason for consult:  Substance Use/ETOH Abuse                Permission sought to share information with:    Permission granted to share information::  No  Name::        Agency::     Relationship::     Contact Information:     Housing/Transportation Living arrangements for the past 2 months:    Source of Information:    Patient Interpreter Needed:    Criminal Activity/Legal Involvement Pertinent to Current Situation/Hospitalization:    Significant Relationships:  Friend Lives with:  Friends Do you feel safe going back to the place where you live?  Yes Need for family participation in patient care:  No (Coment)  Care giving concerns: Patient reports concerns with ability to access/pay for meds. Patient does not have insurance or any source of income.    Social Worker assessment / plan:  LCSW consulted for [employer]. Patient admitted for SOB and weakness  LCSW met at bedside with patient.   Patient complained of not being able to access meds and no income or insurance. Patient reports that he previously received medicaid and medicare, but it was cut off. He stated that he reapplied and was denied. Patient also reports that he applied for disability and was denied. He stated that he appealed both decisions and was denied again. Patient reports that he his frustrated.   LCSW discussed SA resources with patient. Patient reports that he has been to [facility]. He reports that both programs denied him when he went for his interview due to the meds that he was taking. Patient also reports that he has been to [place] and it does not work for him. Patient was familiar with all resource available and declined all services.   Patient became agitated and reported that he was ready to leave. Patient started to fidget with his IV. LCSW  notified RN.  LCSW notified RNCM about patient needing access to meds. RNCM is assisting patient with meds.   PLAN: Patient declined SA resources. LCSW signing off.   Employment status:  Unemployed Forensic scientist:  Self Pay (Medicaid Pending) PT Recommendations:  Not assessed at this time Information / Referral to community resources:  Outpatient Substance Abuse Treatment Options  Patient/Family's Response to care:  Patient reports that he is frustrated. Patient became physically agitated at the end of assessment and repeatedly stated he was ready to leave. Patient reported that he was appreciative of LCSW speaking with him, but he is familiar with the resources and is not interested.   Patient/Family's Understanding of and Emotional Response to Diagnosis, Current Treatment, and Prognosis:  Patient is understanding of his current diagnosis. Patient reports that he is familiar with the meds prescribed and he understands how to access them for the trial. Patient expressed concerns about accessing meds once the trial is over. Patient reports that he is familiar with the process and is frustrated. LCSW acknowledged patients concerns.   Emotional Assessment Appearance:  Appears older than stated age Attitude/Demeanor/Rapport:  Complaining Affect (typically observed):  Depressed, Hopeless Orientation:  Oriented to Self, Oriented to Place, Oriented to  Time, Oriented to Situation Alcohol / Substance use:  Alcohol Use, Tobacco Use Psych involvement (Current and /or in  the community):  No (Comment)  Discharge Needs  Concerns to be addressed:  Financial / Insurance Concerns, Medication Concerns Readmission within the last 30 days:  No Current discharge risk:  Substance Abuse Barriers to Discharge:  Continued Medical Work up   [employer], LCSW 11/26/2017, 10:13 AM

## 2017-11-26 NOTE — Discharge Summary (Signed)
Physician Discharge Summary  [name]  GGE:[id number]  DOB: April 04, 1966  DOA: 11/23/2017 PCP: Charlott Rakes, MD  Admit date: 11/23/2017 Discharge date: 11/26/2017  Admitted From: Home  Disposition:  Home   Recommendations for Outpatient Follow-up:  1. Follow up with PCP in 1 week.  2. Medication compliance  Discharge Condition: Stable  CODE STATUS: Full  Diet recommendation: Heart Healthy   Brief/Interim Summary: For full details see H&P/Progress note, but in brief, [name] is a a 52 year old male with medical history significant of left lower extremity DVT, daily alcohol and tobacco abuse, COPD and CAD who presented to the emergency department complaining of left lower extremity weakness and shortness of breath.  Upon ED evaluation patient was noted to be hypoxic saturating to the 85% with diffuse  wheezing and coarse breath sounds.  Patient was given Solu-Medrol and nebulizer treatments and was placed  on supplemental oxygen.  He also was found to be intoxicated with alcohol but  as he sobered up starting to show signs of alcohol withdrawal with tachycardia and elevated pressure, patient therefore was admitted with working diagnosis of COPD exacerbation and alcohol withdrawal symptoms.  Subjective: Patient seen and examined, reported he is doing much better and wants to go home.  Denies any chest pain, shortness of breath, dizziness, nausea, vomiting and tremors.  Discharge Diagnoses/Hospital Course:  Acute hypoxic respiratory failure secondary to COPD exacerbation Initially requiring oxygen supplementation which was successfully weaned off. She was treated with IV Solu-Medrol and nebulizers.  Subsequently switched to MDIs. Will continue albuterol as needed and Dulera 2 puffs twice a day.  Medication has been given to patient at bedside.  [place] letter done by case manager CT angiogram in the ED is negative for PE and shows subsegmental right upper lobe atelectasis Tobacco  cessation discussed interested in quitting at this moment  Alcohol intoxication with signs of alcohol withdrawal CIWA score 0 this morning done by me Patient was treated with Librium taper, I have a long discussion with patient regarding use of alcohol.  I offered to continue Librium taper although patient reports that he might go home and drink again.  He would like to stay away from alcohol but he wants to be safe I will not like to take any benzos at this time.  Have recommended patient to go to [place] meetings or alcohol abuse rehab program patient reported that he will think about it.  Social worker was consulted and provide patient with resources  Left lower extremity DVT Ultrasound done on 3/19 shows left obstruction in the gastrocnemius vein ? acute versus chronic.  Patient restarted on Xarelto, medication has been given at bedside.  On the day of the discharge the patient's vitals were stable, and no other acute medical condition were reported by patient. the patient was felt safe to be discharge to home  Discharge Instructions  You were cared for by a hospitalist during your hospital stay. If you have any questions about your discharge medications or the care you received while you were in the hospital after you are discharged, you can call the unit and asked to speak with the hospitalist on call if the hospitalist that took care of you is not available. Once you are discharged, your primary care physician will handle any further medical issues. Please note that NO REFILLS for any discharge medications will be authorized once you are discharged, as it is imperative that you return to your primary care physician (or establish a relationship with a  primary care physician if you do not have one) for your aftercare needs so that they can reassess your need for medications and monitor your lab values.  Discharge Instructions    Call MD for:  difficulty breathing, headache or visual disturbances    Complete by:  As directed    Call MD for:  extreme fatigue   Complete by:  As directed    Call MD for:  hives   Complete by:  As directed    Call MD for:  persistant dizziness or light-headedness   Complete by:  As directed    Call MD for:  persistant nausea and vomiting   Complete by:  As directed    Call MD for:  redness, tenderness, or signs of infection (pain, swelling, redness, odor or green/yellow discharge around incision site)   Complete by:  As directed    Call MD for:  severe uncontrolled pain   Complete by:  As directed    Call MD for:  temperature >100.4   Complete by:  As directed    Diet - low sodium heart healthy   Complete by:  As directed    Increase activity slowly   Complete by:  As directed      Allergies as of 11/26/2017   No Known Allergies     Medication List    STOP taking these medications   citalopram 40 MG tablet Commonly known as:  CELEXA   hydrOXYzine 25 MG tablet Commonly known as:  ATARAX/VISTARIL   rivaroxaban 20 MG Tabs tablet Commonly known as:  XARELTO Replaced by:  Rivaroxaban 15 & 20 MG Tbpk     TAKE these medications   albuterol 108 (90 Base) MCG/ACT inhaler Commonly known as:  PROVENTIL HFA;VENTOLIN HFA Inhale 2 puffs into the lungs every 6 (six) hours as needed for wheezing or shortness of breath.   BC HEADACHE PO Take 1 packet by mouth 2 (two) times daily as needed (pain).   cyclobenzaprine 10 MG tablet Commonly known as:  FLEXERIL Take 1 tablet (10 mg total) by mouth 2 (two) times daily as needed for muscle spasms.   furosemide 20 MG tablet Commonly known as:  LASIX Take 1 tablet (20 mg total) by mouth daily.   mometasone-formoterol 100-5 MCG/ACT Aero Commonly known as:  DULERA Inhale 2 puffs into the lungs 2 (two) times daily.   Rivaroxaban 15 & 20 MG Tbpk Take as directed on package: Start with one 15mg  tablet by mouth twice a day with food. On Day 22, switch to one 20mg  tablet once a day with food. Replaces:   rivaroxaban 20 MG Tabs tablet   tiotropium 18 MCG inhalation capsule Commonly known as:  SPIRIVA HANDIHALER Place 1 capsule (18 mcg total) into inhaler and inhale daily.      Follow-up Information    Charlott Rakes, MD Follow up.   Specialty:  Family Medicine Contact information: [place] [address] [phone]        Marliss Coots, NP Follow up.   Contact information: [address] [place] [address] [phone]          No Known Allergies  Consultations:   Procedures/Studies: Ct Head Wo Contrast  Result Date: 11/23/2017 CLINICAL DATA:  Multiple falls shortness of breath EXAM: CT HEAD WITHOUT CONTRAST CT CERVICAL SPINE WITHOUT CONTRAST TECHNIQUE: Multidetector CT imaging of the head and cervical spine was performed following the standard protocol without intravenous contrast. Multiplanar CT image reconstructions of the  cervical spine were also generated. COMPARISON:  Head CT 03/05/2013, CT cervical spine 08/04/2012 FINDINGS: CT HEAD FINDINGS Brain: No acute territorial infarction, hemorrhage, or intracranial mass is visualized. Moderate atrophy. Stable ventricle size. Enlarged retro cerebellar CSF density unchanged, possible arachnoid cyst. Mild small vessel ischemic changes of the white matter. Vascular: No hyperdense vessels.  No unexpected calcification Skull: No fracture Sinuses/Orbits: Mucosal thickening and retention cysts in the left sphenoid sinus. No acute orbital abnormality Other: None CT CERVICAL SPINE FINDINGS Alignment: Mild reversal of cervical lordosis. No subluxation. Facet alignment within normal limits. Skull base and vertebrae: No acute fracture. No primary bone lesion or focal pathologic process. Soft tissues and spinal canal: No prevertebral fluid or swelling. No visible canal hematoma. Disc levels:  Mild degenerative changes at C4-C5, C5-C6 and C6-C7. Upper chest: Mild apical emphysema. Other: None IMPRESSION: 1. No CT  evidence for acute intracranial abnormality. Atrophy, prominent for age. Mild small vessel ischemic changes of the white matter. 2. Reversal of cervical lordosis with degenerative changes. No acute osseous abnormality. Electronically Signed   By: Donavan Foil M.D.   On: 11/23/2017 20:03   Ct Angio Chest Pe W And/or Wo Contrast  Result Date: 11/23/2017 CLINICAL DATA:  Chronic dyspnea, multiple falls over past 3 weeks, shortness of breath, wheezing, history COPD, collagen vascular disease, pulmonary embolism, smoker EXAM: CT ANGIOGRAPHY CHEST WITH CONTRAST TECHNIQUE: Multidetector CT imaging of the chest was performed using the standard protocol during bolus administration of intravenous contrast. Multiplanar CT image reconstructions and MIPs were obtained to evaluate the vascular anatomy. CONTRAST:  162mL ISOVUE-370 IOPAMIDOL (ISOVUE-370) INJECTION 76% IV COMPARISON:  12/21/2016 FINDINGS: Cardiovascular: Minimal atherosclerotic calcification aorta. Aorta normal caliber without aneurysm or dissection. No pericardial effusion. Scattered respiratory motion artifacts in the lower lobes. Pulmonary arteries adequately opacified and patent. No evidence of pulmonary embolism. Mediastinum/Nodes: Esophagus unremarkable. Base of cervical region normal appearance. No thoracic adenopathy. Lungs/Pleura: Calcified granuloma at superior segment LEFT lower lobe. Question atelectasis in medial RIGHT upper lobe posteriorly. No pulmonary infiltrate, pleural effusion or pneumothorax. Upper Abdomen: Diffuse fatty infiltration of liver. Remaining visualized upper abdomen unremarkable. Musculoskeletal: No acute osseous findings. Review of the MIP images confirms the above findings. IMPRESSION: No evidence of pulmonary embolism. Old granulomatous disease. Subsegmental atelectasis RIGHT upper lobe. Hepatic steatosis. Aortic Atherosclerosis (ICD10-I70.0). Electronically Signed   By: Lavonia Dana M.D.   On: 11/23/2017 20:01   Ct Cervical  Spine Wo Contrast  Result Date: 11/23/2017 CLINICAL DATA:  Multiple falls shortness of breath EXAM: CT HEAD WITHOUT CONTRAST CT CERVICAL SPINE WITHOUT CONTRAST TECHNIQUE: Multidetector CT imaging of the head and cervical spine was performed following the standard protocol without intravenous contrast. Multiplanar CT image reconstructions of the cervical spine were also generated. COMPARISON:  Head CT 03/05/2013, CT cervical spine 08/04/2012 FINDINGS: CT HEAD FINDINGS Brain: No acute territorial infarction, hemorrhage, or intracranial mass is visualized. Moderate atrophy. Stable ventricle size. Enlarged retro cerebellar CSF density unchanged, possible arachnoid cyst. Mild small vessel ischemic changes of the white matter. Vascular: No hyperdense vessels.  No unexpected calcification Skull: No fracture Sinuses/Orbits: Mucosal thickening and retention cysts in the left sphenoid sinus. No acute orbital abnormality Other: None CT CERVICAL SPINE FINDINGS Alignment: Mild reversal of cervical lordosis. No subluxation. Facet alignment within normal limits. Skull base and vertebrae: No acute fracture. No primary bone lesion or focal pathologic process. Soft tissues and spinal canal: No prevertebral fluid or swelling. No visible canal hematoma. Disc levels:  Mild degenerative changes at C4-C5, C5-C6  and C6-C7. Upper chest: Mild apical emphysema. Other: None IMPRESSION: 1. No CT evidence for acute intracranial abnormality. Atrophy, prominent for age. Mild small vessel ischemic changes of the white matter. 2. Reversal of cervical lordosis with degenerative changes. No acute osseous abnormality. Electronically Signed   By: Donavan Foil M.D.   On: 11/23/2017 20:03   Dg Knee Complete 4 Views Right  Result Date: 11/23/2017 CLINICAL DATA:  Right anterior knee pain. Multiple falls over the past 3 weeks. Initial encounter. EXAM: RIGHT KNEE - COMPLETE 4+ VIEW COMPARISON:  None. FINDINGS: There is no evidence of acute fracture,  dislocation, or knee joint effusion. Joint space widths are preserved without significant arthropathic changes identified. A few small soft tissue calcifications are noted in the lower leg. IMPRESSION: No acute osseous abnormality. Electronically Signed   By: Logan Bores M.D.   On: 11/23/2017 18:16    Discharge Exam: Vitals:   11/25/17 2029 11/26/17 0521  BP: (!) 164/89 (!) 175/93  Pulse: 64 60  Resp: 18 18  Temp: 98.8 F (37.1 C) 98.4 F (36.9 C)  SpO2: 100% 93%   Vitals:   11/25/17 1352 11/25/17 2029 11/26/17 0500 11/26/17 0521  BP: 138/72 (!) 164/89  (!) 175/93  Pulse: 95 64  60  Resp:  18  18  Temp: 98.1 F (36.7 C) 98.8 F (37.1 C)  98.4 F (36.9 C)  TempSrc: Oral Oral  Oral  SpO2: 96% 100%  93%  Weight:   99 kg (218 lb 4.1 oz)   Height:        General: Pt is alert, awake, not in acute distress Cardiovascular: RRR, S1/S2 +, no rubs, no gallops Respiratory: CTA bilaterally, no wheezing, no rhonchi  The results of significant diagnostics from this hospitalization (including imaging, microbiology, ancillary and laboratory) are listed below for reference.     Microbiology: No results found for this or any previous visit (from the past 240 hour(s)).   Labs: BNP (last 3 results) Recent Labs    11/23/17 1856  BNP 96.2   Basic Metabolic Panel: Recent Labs  Lab 11/23/17 1856 11/23/17 1904 11/24/17 0623 11/24/17 0805 11/25/17 0605  NA 143 141 136  --  140  K 4.1 4.1 6.9* 4.5 3.8  CL 106 103 104  --  104  CO2 26  --   --   --  25  GLUCOSE 87 85 76  --  138*  BUN 5* <3* 5*  --  8  CREATININE 0.67 1.00 0.60*  --  0.62  CALCIUM 9.1  --   --   --  8.1*  MG 1.7  --   --   --   --    Liver Function Tests: Recent Labs  Lab 11/23/17 1856 11/25/17 0605  AST 139* 108*  ALT 73* 58  ALKPHOS 100 81  BILITOT 0.2* 0.6  PROT 7.1 6.1*  ALBUMIN 3.7 3.0*   No results for input(s): LIPASE, AMYLASE in the last 168 hours. No results for input(s): AMMONIA in the last  168 hours. CBC: Recent Labs  Lab 11/23/17 1856 11/23/17 1904 11/24/17 0623  WBC 4.5  --   --   NEUTROABS 2.4  --   --   HGB 14.3 15.6 12.6*  HCT 43.0 46.0 37.0*  MCV 106.4*  --   --   PLT 120*  --   --    Cardiac Enzymes: Recent Labs  Lab 11/23/17 1856  TROPONINI <0.03   BNP: Invalid input(s): POCBNP CBG:  No results for input(s): GLUCAP in the last 168 hours. D-Dimer No results for input(s): DDIMER in the last 72 hours. Hgb A1c No results for input(s): HGBA1C in the last 72 hours. Lipid Profile No results for input(s): CHOL, HDL, LDLCALC, TRIG, CHOLHDL, LDLDIRECT in the last 72 hours. Thyroid function studies No results for input(s): TSH, T4TOTAL, T3FREE, THYROIDAB in the last 72 hours.  Invalid input(s): FREET3 Anemia work up No results for input(s): VITAMINB12, FOLATE, FERRITIN, TIBC, IRON, RETICCTPCT in the last 72 hours. Urinalysis    Component Value Date/Time   COLORURINE YELLOW 02/09/2016 0553   APPEARANCEUR CLEAR 02/09/2016 0553   LABSPEC >1.046 (H) 02/09/2016 0553   PHURINE 5.0 02/09/2016 0553   GLUCOSEU NEGATIVE 02/09/2016 0553   HGBUR NEGATIVE 02/09/2016 0553   BILIRUBINUR NEGATIVE 02/09/2016 0553   KETONESUR NEGATIVE 02/09/2016 0553   PROTEINUR NEGATIVE 02/09/2016 0553   UROBILINOGEN 0.2 04/25/2015 0245   NITRITE NEGATIVE 02/09/2016 0553   LEUKOCYTESUR NEGATIVE 02/09/2016 0553   Sepsis Labs Invalid input(s): PROCALCITONIN,  WBC,  LACTICIDVEN Microbiology No results found for this or any previous visit (from the past 240 hour(s)).   Time coordinating discharge: 32 minutes  SIGNED:  Chipper Oman, MD  [facility] 11/26/2017, 11:45 AM  Pager please text page via  [email or web address]  Note - This record has been created using [employer]. Chart creation errors have been sought, but may not always have been located. Such creation errors do not reflect on the standard of medical care.

## 2017-11-26 NOTE — Care Management Note (Signed)
Case Management Note  Patient Details  Name: [name] MRN: [id number] Date of Birth: 07-22-1966  Per [facility], they have Albuterol, Gwenyth Bouillon and Spiriva scripts there and will get them ready for pt. Match Letter given to pt RN to give to Outpatient Pharmacy when meds are picked up. Per MD, no additional scripts are needed for discharge.  Additional CommentsLynnell Catalan, RN 11/26/2017, 10:13 AM [phone]

## 2017-12-09 ENCOUNTER — Emergency Department (HOSPITAL_COMMUNITY)
Admission: EM | Admit: 2017-12-09 | Discharge: 2018-01-05 | Disposition: E | Payer: Medicaid Other | Attending: Emergency Medicine | Admitting: Emergency Medicine

## 2017-12-09 ENCOUNTER — Other Ambulatory Visit: Payer: Self-pay

## 2017-12-09 DIAGNOSIS — Y939 Activity, unspecified: Secondary | ICD-10-CM | POA: Diagnosis not present

## 2017-12-09 DIAGNOSIS — Y929 Unspecified place or not applicable: Secondary | ICD-10-CM | POA: Diagnosis not present

## 2017-12-09 DIAGNOSIS — S81812A Laceration without foreign body, left lower leg, initial encounter: Secondary | ICD-10-CM | POA: Diagnosis present

## 2017-12-09 DIAGNOSIS — Y999 Unspecified external cause status: Secondary | ICD-10-CM | POA: Diagnosis not present

## 2017-12-09 DIAGNOSIS — I469 Cardiac arrest, cause unspecified: Secondary | ICD-10-CM

## 2017-12-09 MED ORDER — SODIUM BICARBONATE 8.4 % IV SOLN
INTRAVENOUS | Status: AC | PRN
  Administered 2017-12-09: 50 meq via INTRAVENOUS

## 2017-12-09 MED ORDER — EPINEPHRINE PF 1 MG/10ML IJ SOSY
PREFILLED_SYRINGE | INTRAMUSCULAR | Status: AC | PRN
  Administered 2017-12-09 (×5): 1 via INTRAVENOUS

## 2017-12-09 MED ORDER — CALCIUM CHLORIDE 10 % IV SOLN
INTRAVENOUS | Status: AC | PRN
  Administered 2017-12-09: 1 g via INTRAVENOUS

## 2017-12-09 MED ORDER — SODIUM CHLORIDE 0.9 % IV SOLN
INTRAVENOUS | Status: AC | PRN
  Administered 2017-12-09: 1000 mL via INTRAVENOUS

## 2017-12-09 NOTE — Progress Notes (Signed)
Orthopedic Tech Progress Note Patient Details:  [name] 01-07-1966 [id number] Level 1 trauma ortho visit. Patient ID: Gershom Brobeck, male   DOB: 16-Apr-1966, 52 y.o.   MRN: [id number]   Braulio Bosch 01/01/2018, 10:44 PM
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
## 2017-12-09 NOTE — ED Notes (Signed)
CPR started.

## 2017-12-10 LAB — BPAM FFP
BLOOD PRODUCT EXPIRATION DATE: 201904122359
BLOOD PRODUCT EXPIRATION DATE: 201904132359
ISSUE DATE / TIME: 201904042200
ISSUE DATE / TIME: 201904042200
UNIT TYPE AND RH: 6200
Unit Type and Rh: 6200

## 2017-12-10 LAB — BPAM RBC
BLOOD PRODUCT EXPIRATION DATE: 201904182359
Blood Product Expiration Date: 201904182359
ISSUE DATE / TIME: 201904042159
ISSUE DATE / TIME: 201904042159
UNIT TYPE AND RH: 9500
Unit Type and Rh: 9500
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
## 2018-01-05 DEATH — deceased
